# Patient Record
Sex: Female | Born: 1950 | Race: White | Hispanic: No | Marital: Single | Smoking: Never smoker
Health system: Southern US, Community
[De-identification: ages and names within clinical notes are randomized; demographics above are authoritative.]

## PROBLEM LIST (undated history)

## (undated) DIAGNOSIS — F419 Anxiety disorder, unspecified: Secondary | ICD-10-CM

## (undated) DIAGNOSIS — E119 Type 2 diabetes mellitus without complications: Secondary | ICD-10-CM

## (undated) DIAGNOSIS — C801 Malignant (primary) neoplasm, unspecified: Secondary | ICD-10-CM

## (undated) DIAGNOSIS — E785 Hyperlipidemia, unspecified: Secondary | ICD-10-CM

## (undated) DIAGNOSIS — I509 Heart failure, unspecified: Secondary | ICD-10-CM

## (undated) DIAGNOSIS — I10 Essential (primary) hypertension: Secondary | ICD-10-CM

## (undated) DIAGNOSIS — F329 Major depressive disorder, single episode, unspecified: Secondary | ICD-10-CM

## (undated) DIAGNOSIS — F32A Depression, unspecified: Secondary | ICD-10-CM

## (undated) HISTORY — PX: CHOLECYSTECTOMY: SHX55

## (undated) HISTORY — PX: ABDOMINAL HYSTERECTOMY: SHX81

---

## 2003-11-18 ENCOUNTER — Other Ambulatory Visit: Payer: Self-pay

## 2004-05-17 ENCOUNTER — Other Ambulatory Visit: Payer: Self-pay

## 2004-06-09 ENCOUNTER — Ambulatory Visit: Payer: Self-pay | Admitting: Gastroenterology

## 2005-06-22 ENCOUNTER — Emergency Department: Payer: Self-pay | Admitting: Emergency Medicine

## 2005-08-16 ENCOUNTER — Ambulatory Visit: Payer: Self-pay

## 2005-12-27 ENCOUNTER — Ambulatory Visit: Payer: Self-pay | Admitting: Otolaryngology

## 2006-01-26 ENCOUNTER — Ambulatory Visit: Payer: Self-pay | Admitting: Specialist

## 2006-03-17 ENCOUNTER — Emergency Department: Payer: Self-pay | Admitting: Emergency Medicine

## 2006-03-17 ENCOUNTER — Other Ambulatory Visit: Payer: Self-pay

## 2006-03-18 ENCOUNTER — Emergency Department: Payer: Self-pay | Admitting: Emergency Medicine

## 2006-03-19 ENCOUNTER — Emergency Department: Payer: Self-pay | Admitting: Emergency Medicine

## 2006-03-19 IMAGING — CR NECK SOFT TISSUES - 1+ VIEW
1 series · 3 of 3 positions shown · non-contrast
Comparison: none

REASON FOR EXAM: rm 16   Throat swelling
COMMENTS:

PROCEDURE:     DXR - DXR SOFT TISSUE NECK  - [DATE]  [DATE]
RESULT:     AP and lateral view reveals the epiglottis to be intact.  No
thickening of the epiglottis or aryepiglottic folds is noted.  No obvious
mass effect is seen on the tracheal air column.

[Series 1: view not recorded · 0.17mm/px · 3 of 3 slices shown]
[im 1/3]
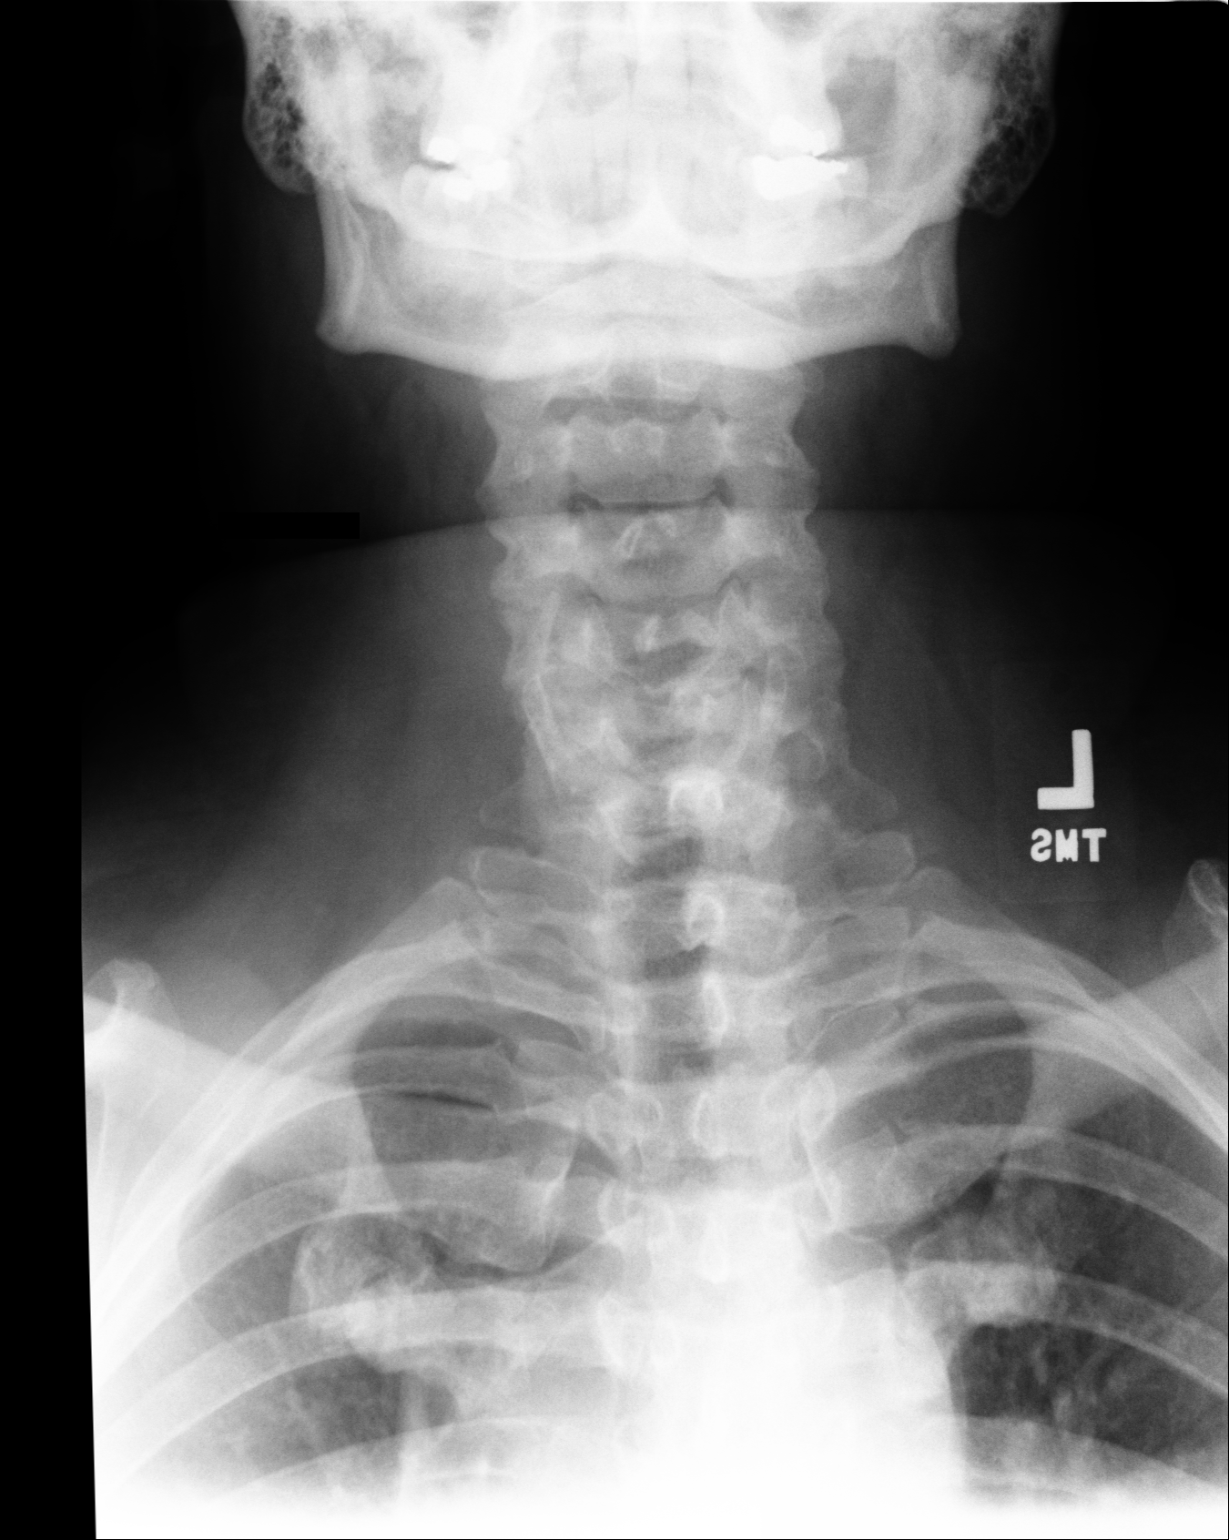
[im 2/3]
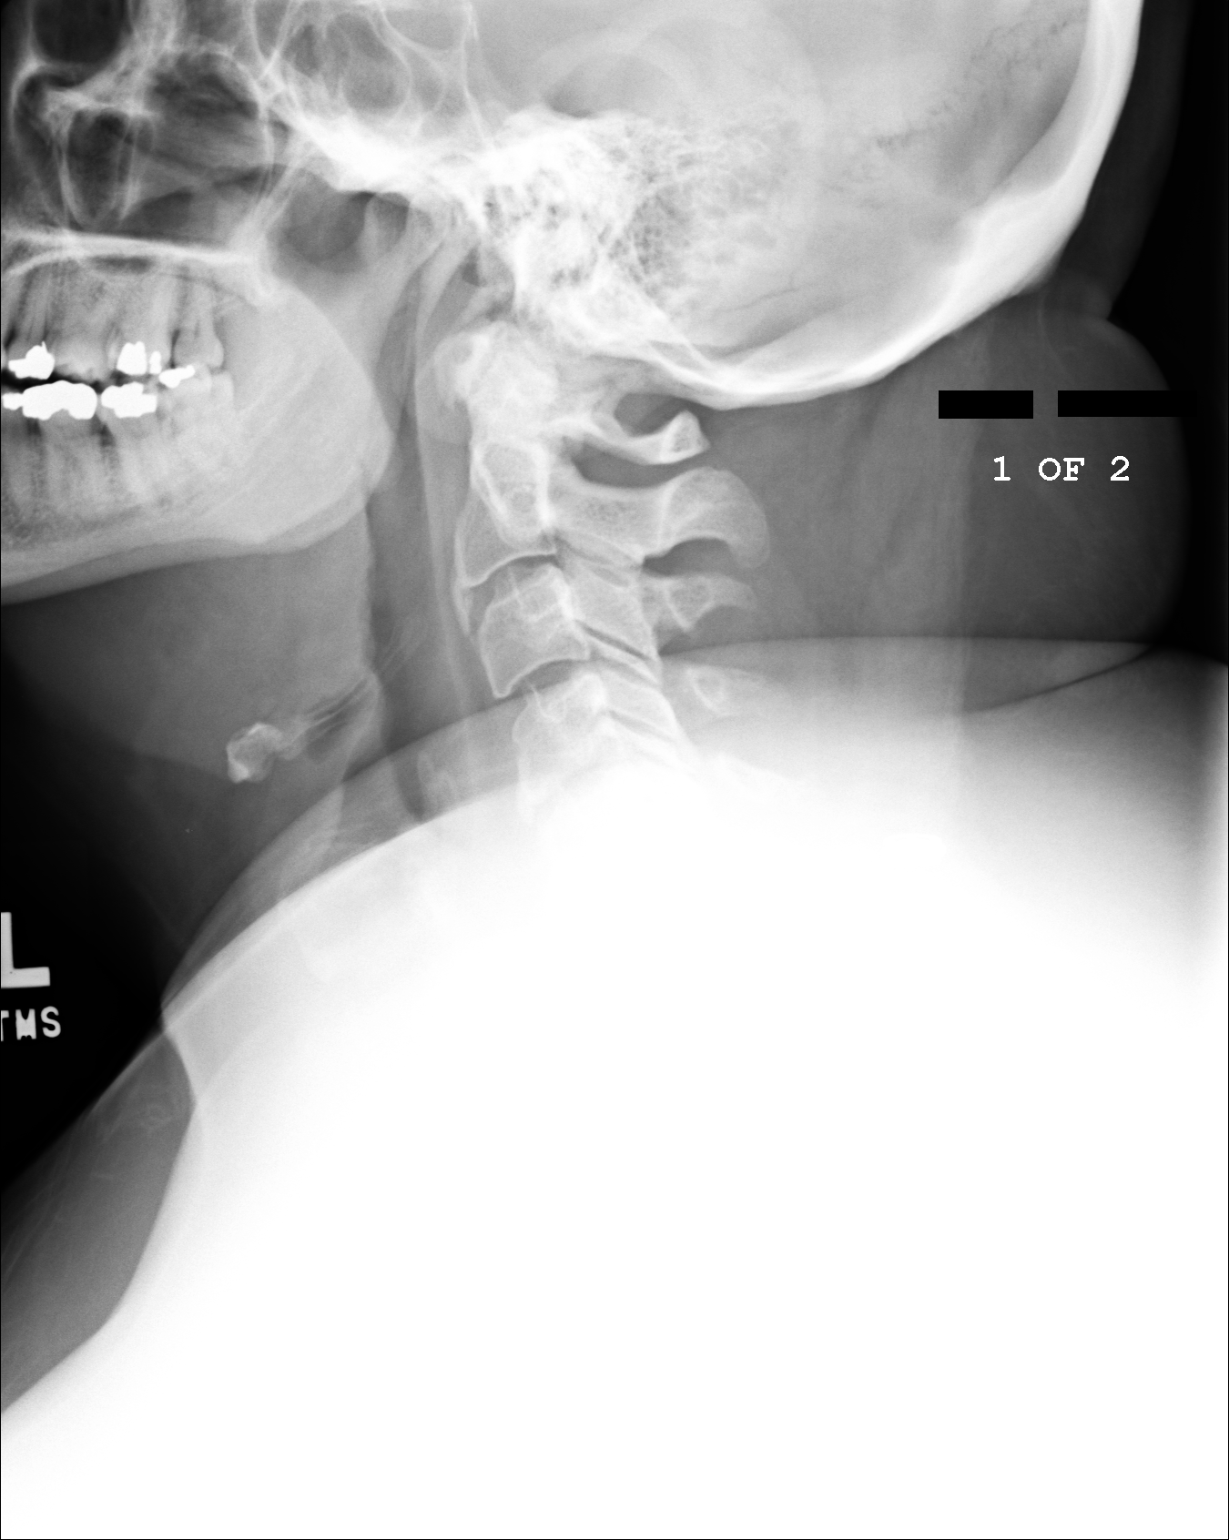
[im 3/3]
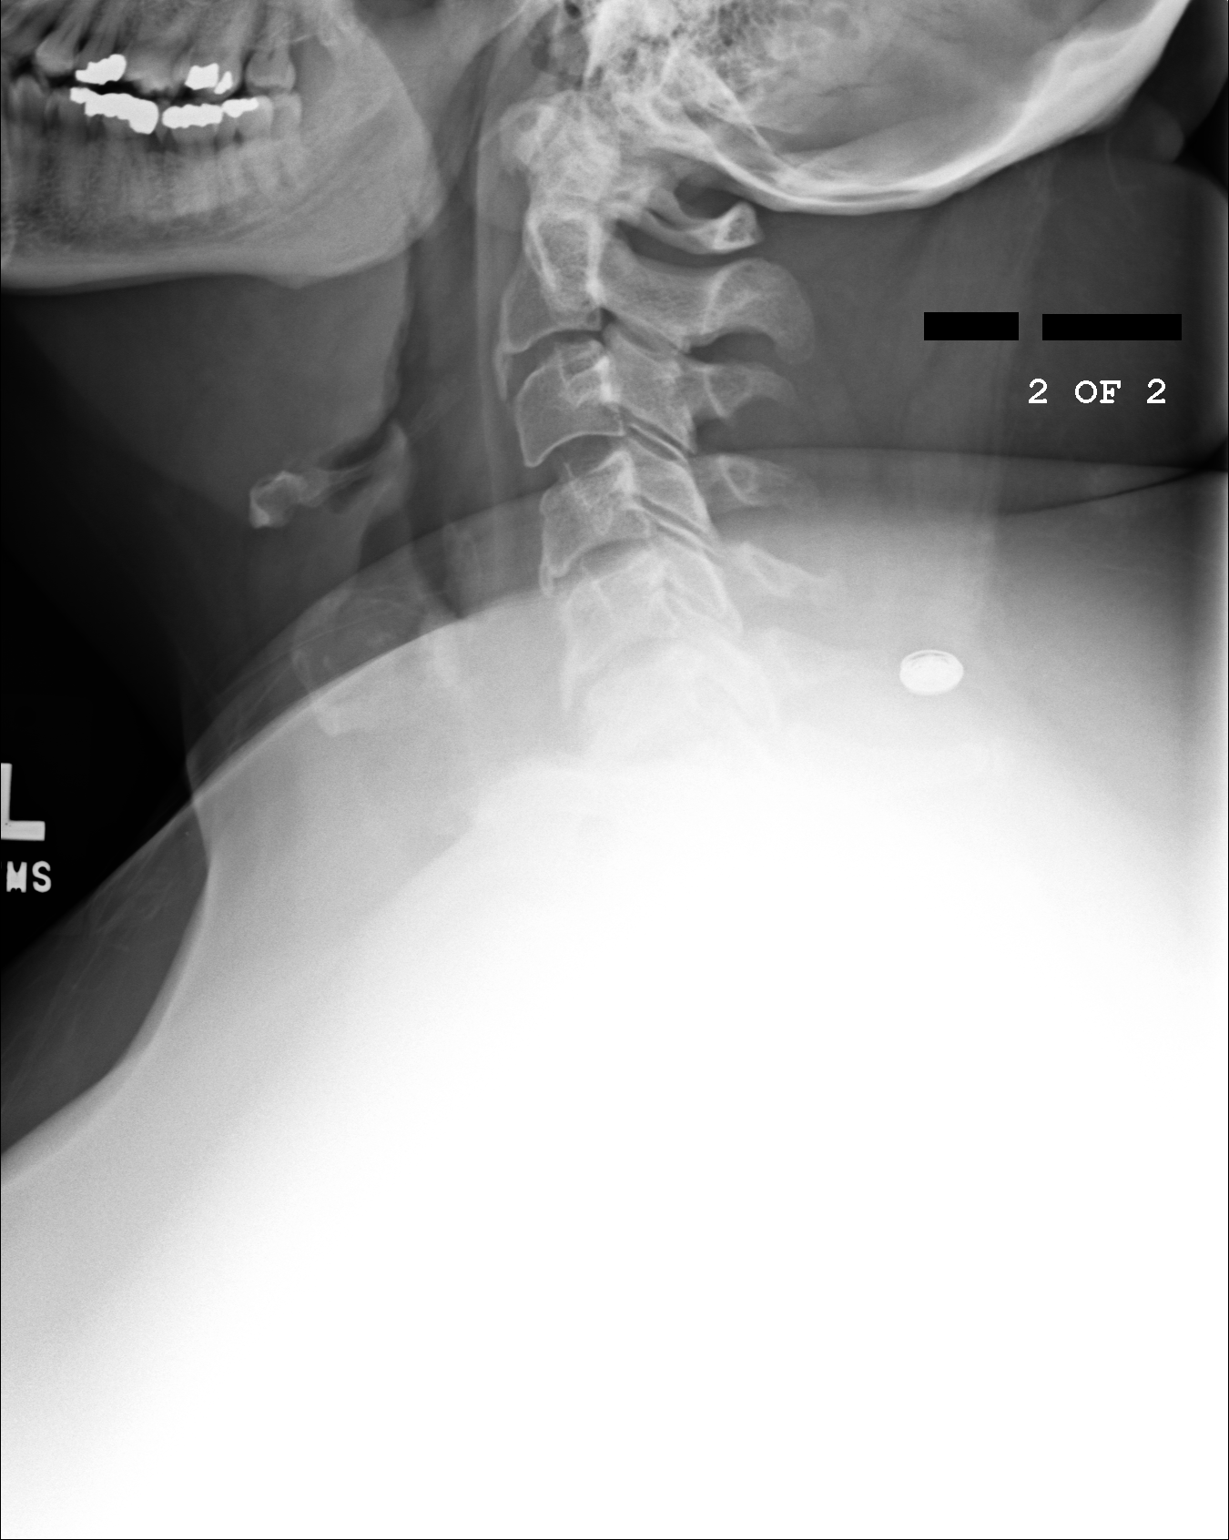

[3 of 3 positions shown; findings below may reference images not displayed]

IMPRESSION: No significant abnormalities noted of the soft tissues of the neck.

## 2006-07-27 ENCOUNTER — Ambulatory Visit: Payer: Self-pay | Admitting: Internal Medicine

## 2006-07-27 IMAGING — CT CT CHEST W/ CM
1 series · 15 of 33 positions shown, 19 images · non-contrast
Comparison: none

REASON FOR EXAM: SOB   CP    CALL report  [PHONE_NUMBER]
COMMENTS:

[Series 5: soft tissue · axial · 0.74mm/px · z∈[-288,-32]mm · 15 of 101 slices shown, 19 images]
[im 8/101  mediastinal]
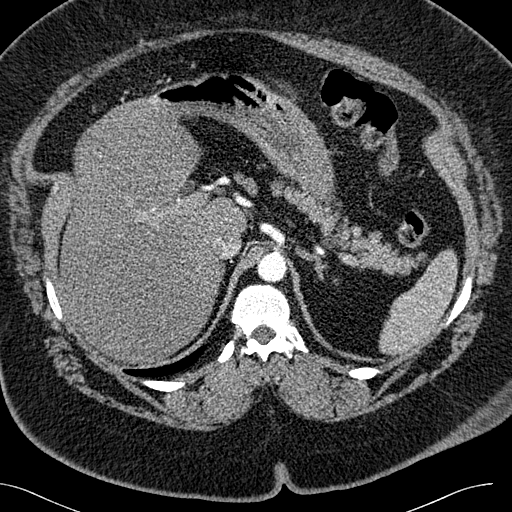
[im 8/101  lung]
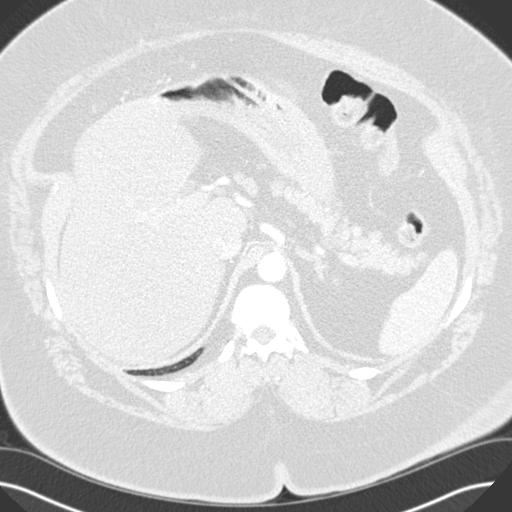
[im 15/101  lung]
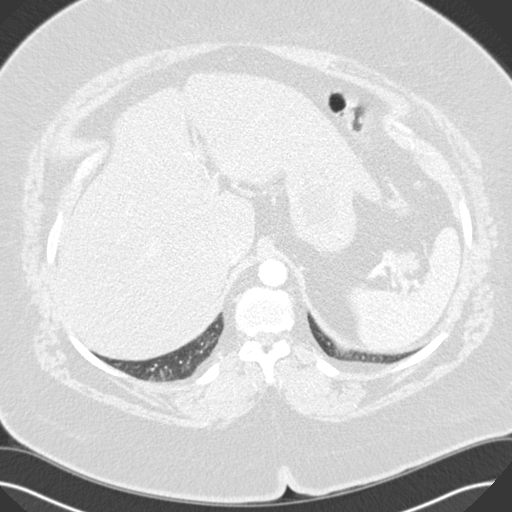
[im 21/101  lung]
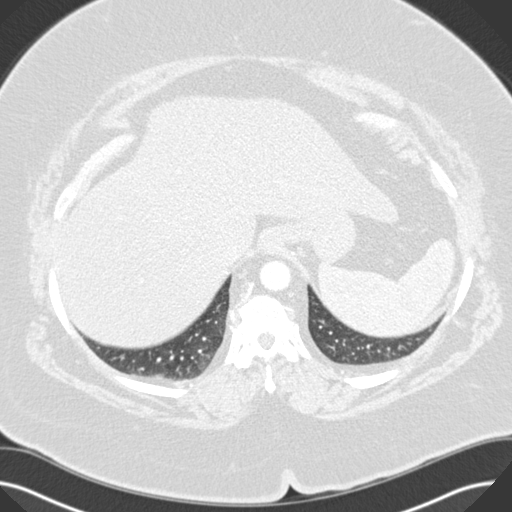
[im 26/101  lung]
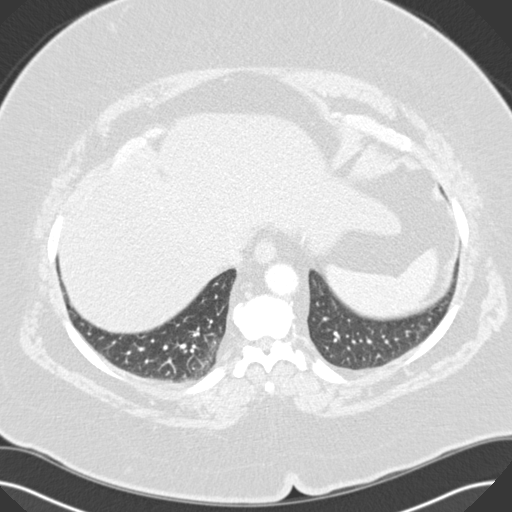
[im 34/101  mediastinal]
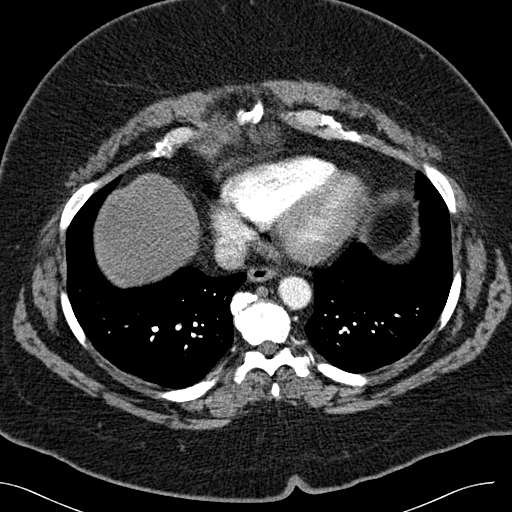
[im 34/101  lung]
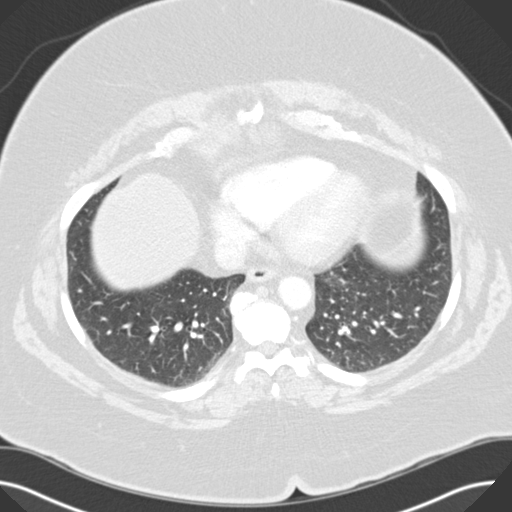
[im 41/101  lung]
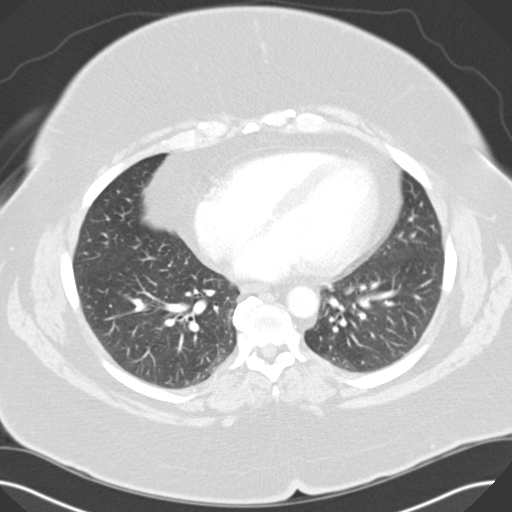
[im 48/101  lung]
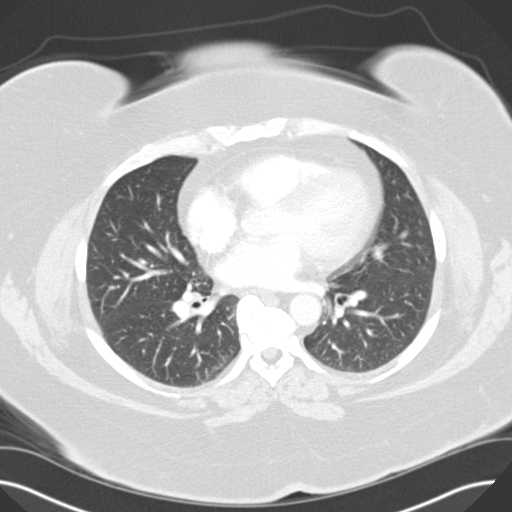
[im 52/101  lung]
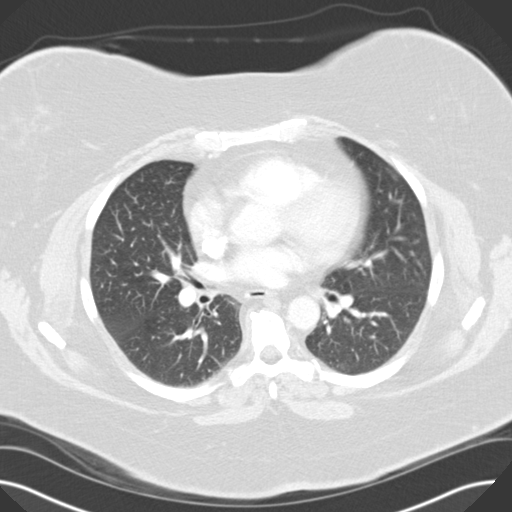
[im 56/101  mediastinal]
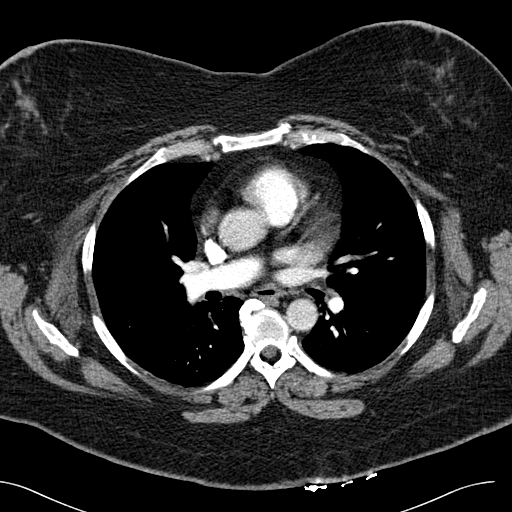
[im 56/101  lung]
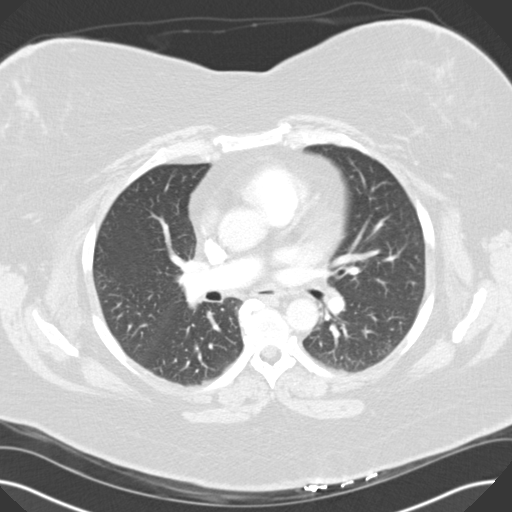
[im 61/101  lung]
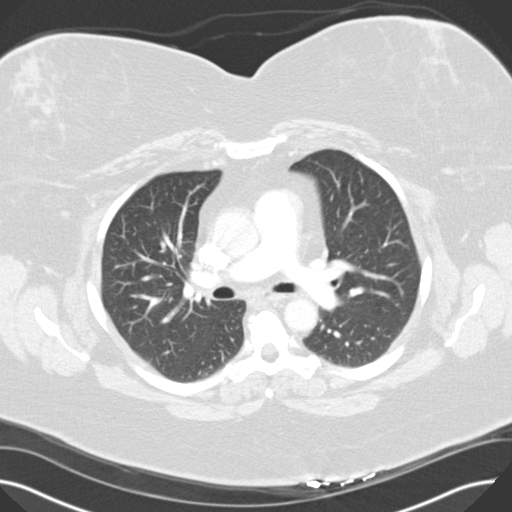
[im 67/101  lung]
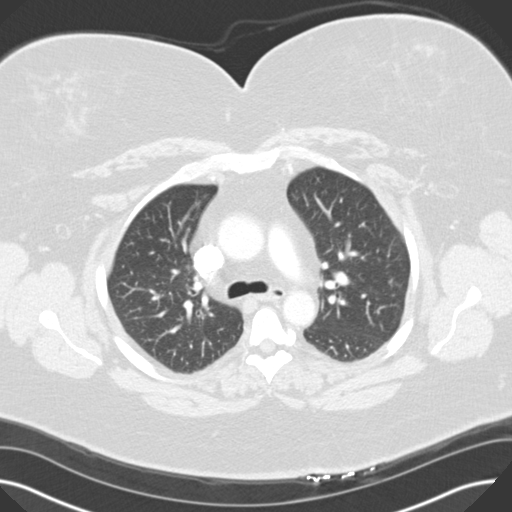
[im 75/101  lung]
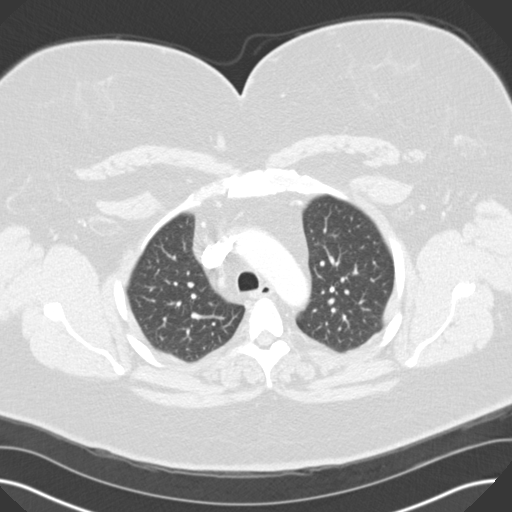
[im 81/101  mediastinal]
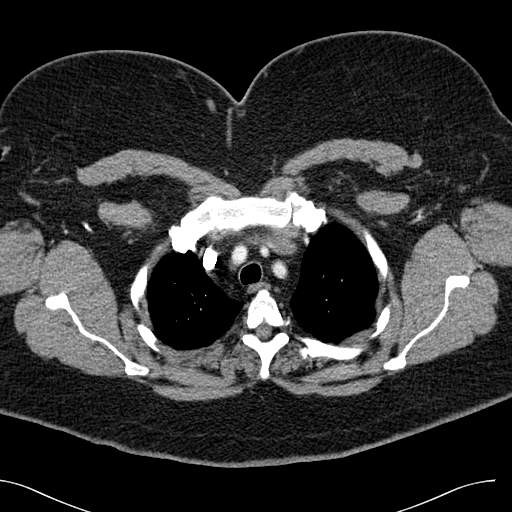
[im 81/101  lung]
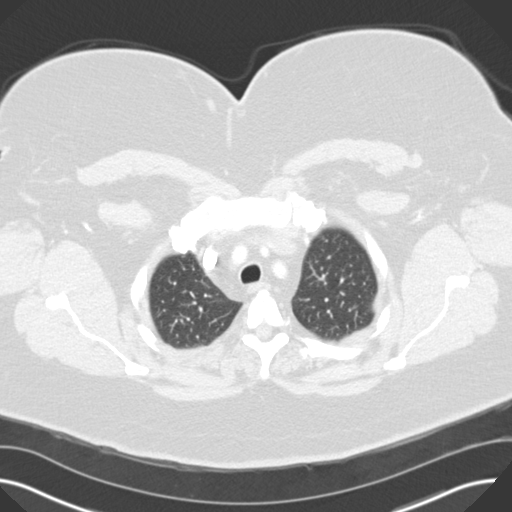
[im 86/101  lung]
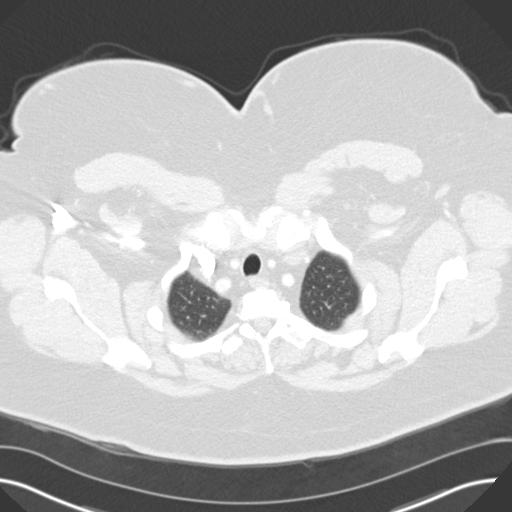
[im 93/101  lung]
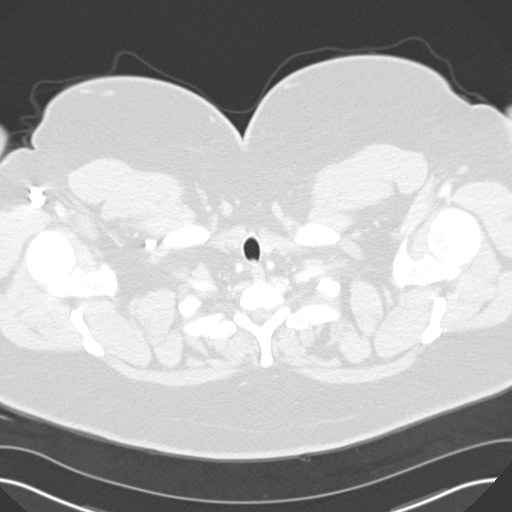

[15 of 33 positions shown; findings below may reference images not displayed]

PROCEDURE:     CT  - CT CHEST (FOR PE) W  - [DATE]  [DATE]

RESULT:     The patient is being evaluated for acute pulmonary embolism.
The patient has unexplained chest discomfort.  The patient received 100 mL
of [FE] for this study.

Contrast within the pulmonary arterial tree is normal in appearance.  I do
not see evidence of an acute pulmonary embolism.  The caliber of the
thoracic aorta is normal.  The cardiac chambers are not enlarged.  The
retrosternal soft tissues are normal in appearance and I see no hilar or
mediastinal lymphadenopathy.  There is no evidence of pneumothorax or
pneumomediastinum.  At lung window settings I see no abnormal nodules or
evidence of alveolar or interstitial infiltrates.  There are degenerative
changes of the thoracic spine at multiple levels with large anterior lateral
osteophytes demonstrated.  Within the upper abdomen the observed portions of
the liver are normal.  There are surgical clips in the gallbladder fossa.
IMPRESSION: I do not see evidence of an acute pulmonary embolism.

There is no evidence of congestive heart failure or of pneumonia.

I see no mediastinal lymphadenopathy and no other mediastinal abnormality.

The findings were called to Dr. SHARIFF at the conclusion of the study.

## 2006-10-17 ENCOUNTER — Ambulatory Visit: Payer: Self-pay

## 2006-10-27 ENCOUNTER — Ambulatory Visit: Payer: Self-pay

## 2007-01-16 ENCOUNTER — Ambulatory Visit: Payer: Self-pay | Admitting: Gastroenterology

## 2007-01-16 IMAGING — CT CT ABD, PELV EXAM
2 series · 14 of 32 positions shown, 19 images · IV contrast (APPLIED)
Comparison: none

REASON FOR EXAM: Abdominal Pain Generalized
COMMENTS:

PROCEDURE:     POLLOCK CRUZ - POLLOCK CRUZ CT ABDOMEN/PELVIS W  - [DATE]  [DATE]
RESULT:
TECHNIQUE: Axial images were obtained from the hemidiaphragms to the pubic
symphysis post intravenous and oral administration of contrast material.

[Series 2: a/p with 8.0 b40s · axial · 0.98mm/px · z∈[-540,-164]mm · 8 of 59 slices shown]
[im 6/59  soft-tissue]
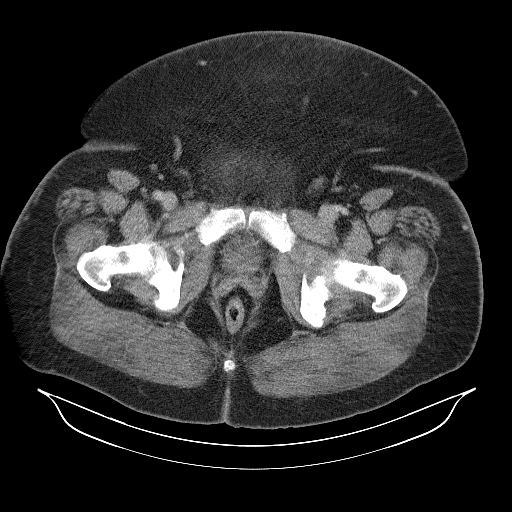
[im 13/59  soft-tissue]
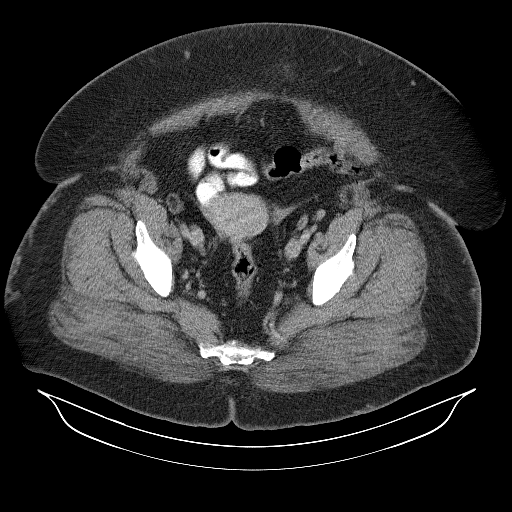
[im 18/59  soft-tissue]
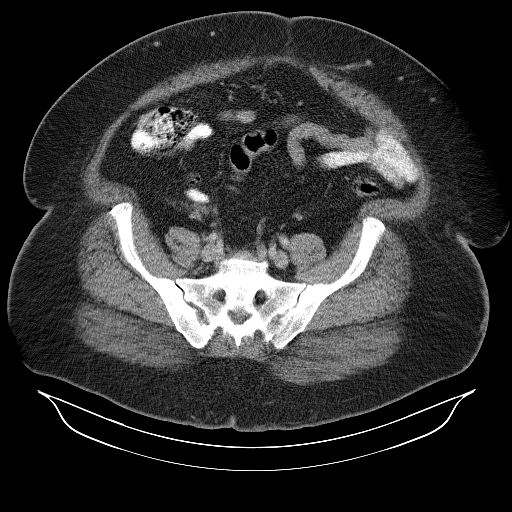
[im 26/59  soft-tissue]
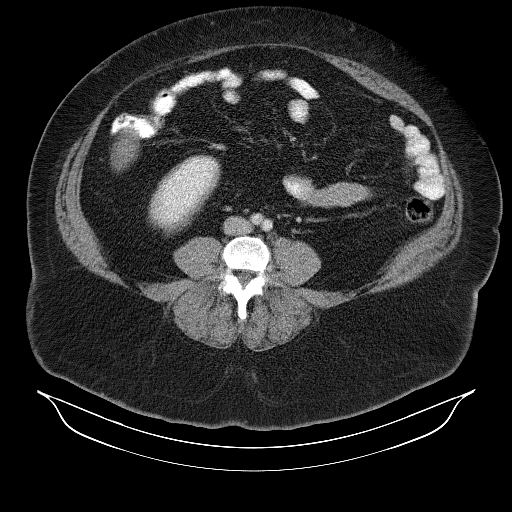
[im 33/59  soft-tissue]
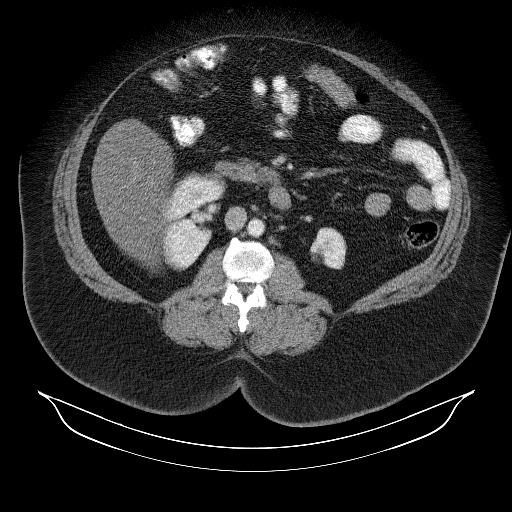
[im 41/59  soft-tissue]
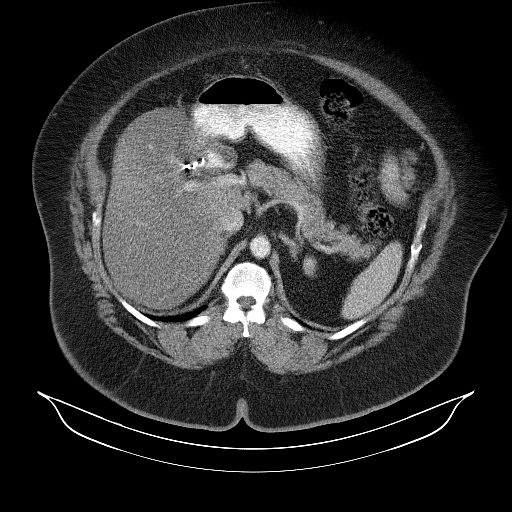
[im 46/59  soft-tissue]
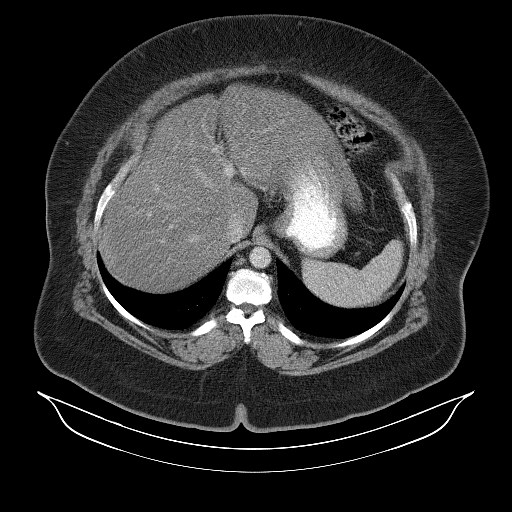
[im 53/59  soft-tissue]
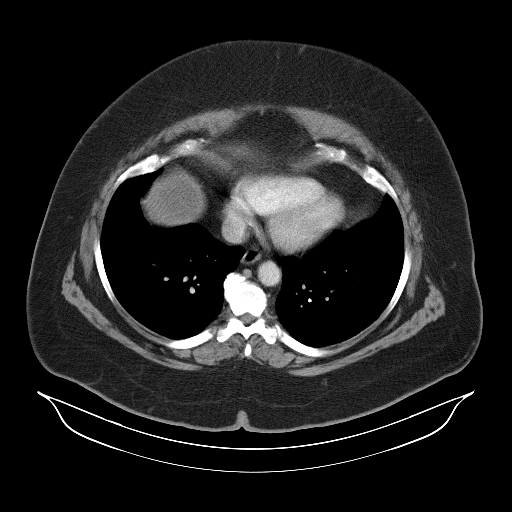

[Series 3: a/p with 8.0 b70s · axial · 0.98mm/px · z∈[-260,-140]mm · 6 of 21 slices shown, 11 images]
[im 3/21  soft-tissue]
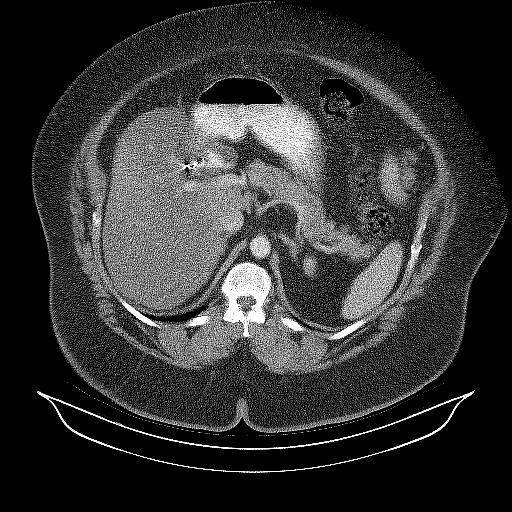
[im 3/21  bone]
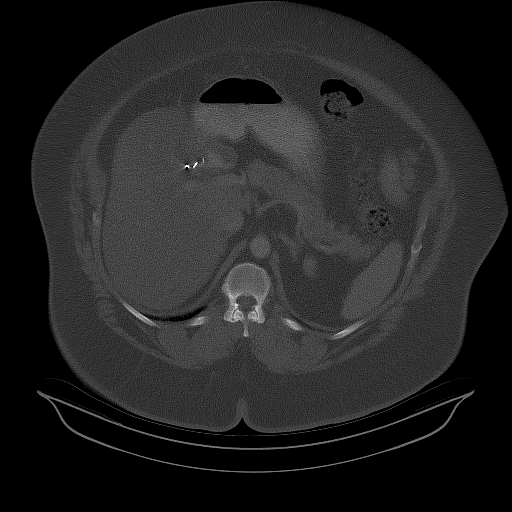
[im 6/21  soft-tissue]
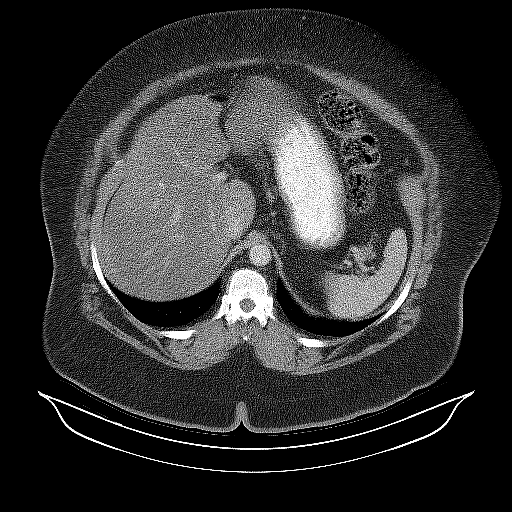
[im 9/21  soft-tissue]
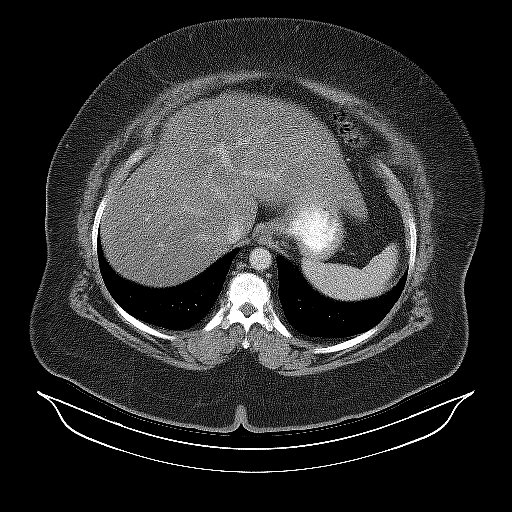
[im 9/21  lung]
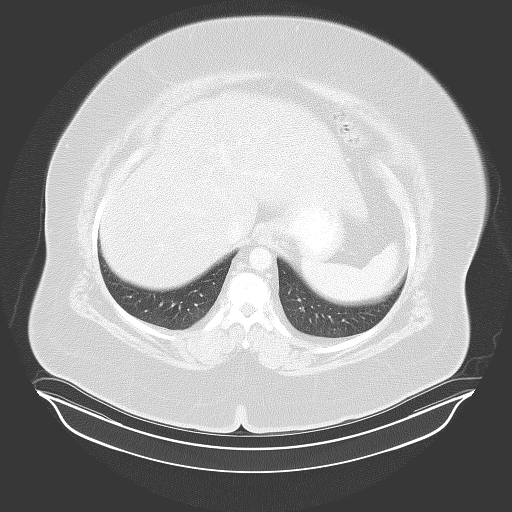
[im 12/21  soft-tissue]
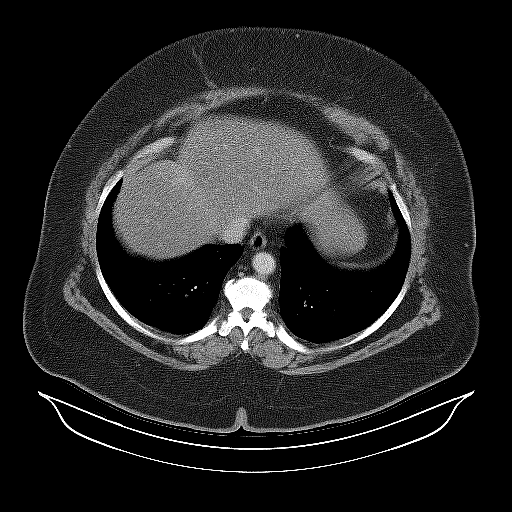
[im 12/21  lung]
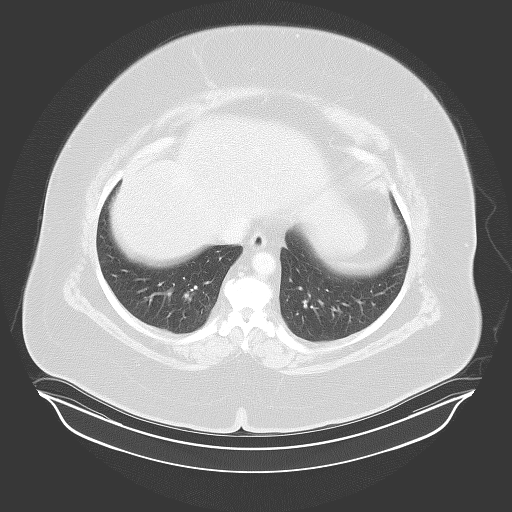
[im 15/21  soft-tissue]
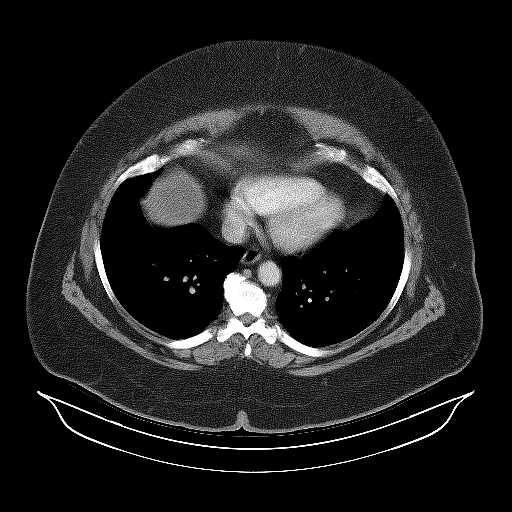
[im 15/21  lung]
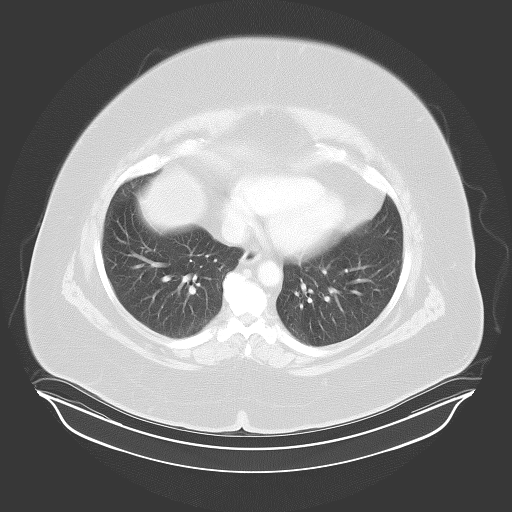
[im 18/21  soft-tissue]
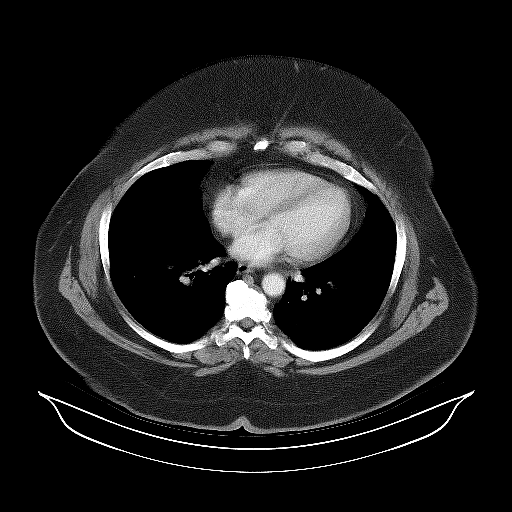
[im 18/21  lung]
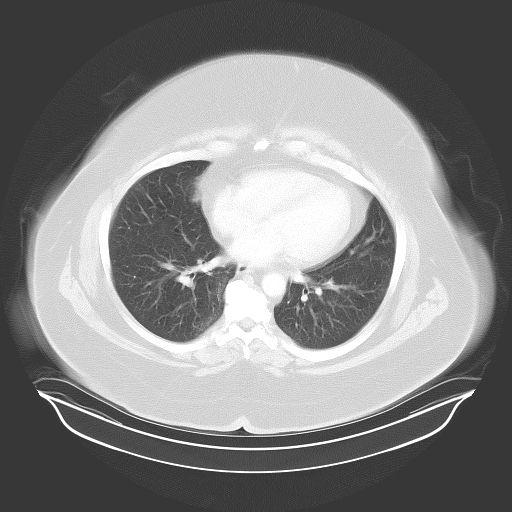

[14 of 32 positions shown; findings below may reference images not displayed]

FINDINGS: The liver and spleen appear intact. No focal lesions are
identified. The liver demonstrates lower attenuation which may represent
some infiltration. The patient is status post cholecystectomy. No pancreatic
masses are seen. The adrenal glands appear intact.  The LEFT kidney is
smaller and lobulated and may be secondary to pyelonephritis. No ascites is
noted. No retroperitoneal or pelvic lymphadenopathy is noted. No anterior
abdominal wall hernias are noted. On the lung window settings the lung bases
appear clear.
IMPRESSION: 1)Fatty infiltration of the liver.

2)Smaller lobulated LEFT kidney which may secondary to pyelonephritis.

## 2007-02-12 ENCOUNTER — Ambulatory Visit: Payer: Self-pay | Admitting: Gastroenterology

## 2007-11-19 ENCOUNTER — Ambulatory Visit: Payer: Self-pay | Admitting: Internal Medicine

## 2007-11-19 IMAGING — CT CT ABD-PELV W/ CM
1 of 2 series · 15 of 32 positions shown, 19 images · non-contrast
Comparison: none

REASON FOR EXAM: RLQ Pain Abdominal Pain
COMMENTS:

[Series 2: appendicitis · axial · 0.85mm/px · z∈[-862,-430]mm · 15 of 157 slices shown, 19 images]
[im 7/157  soft-tissue]
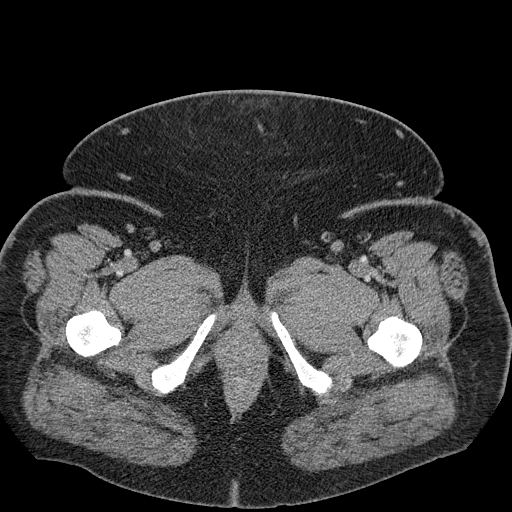
[im 7/157  bone]
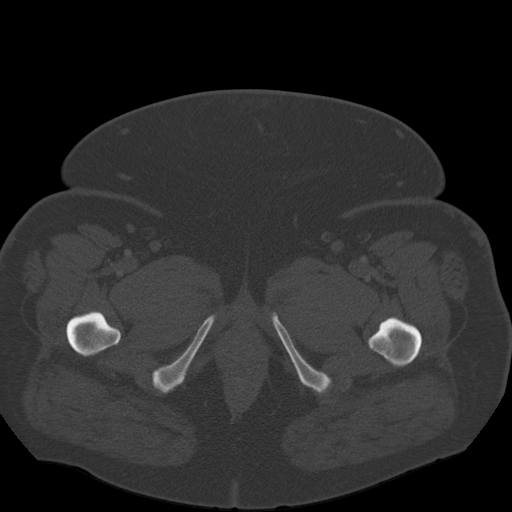
[im 19/157  soft-tissue]
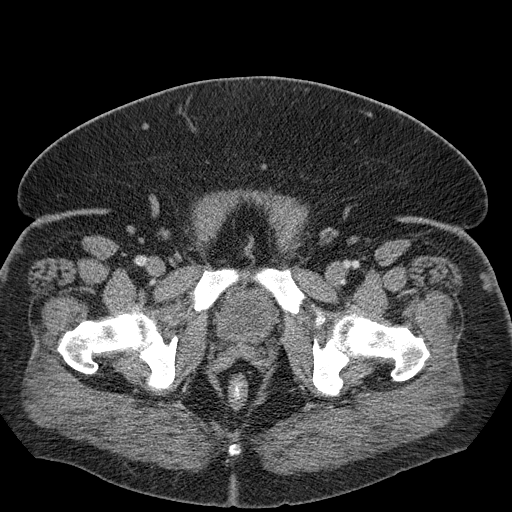
[im 31/157  soft-tissue]
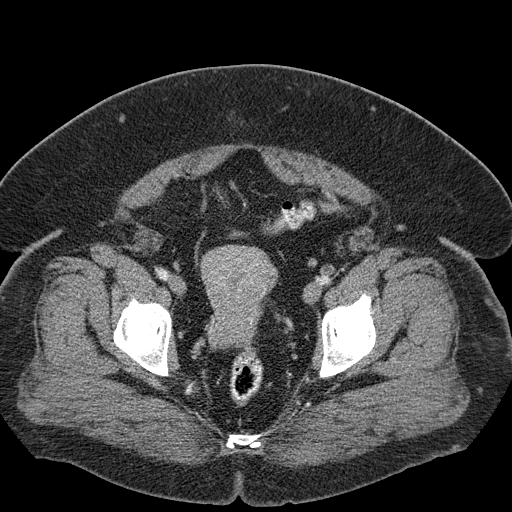
[im 43/157  soft-tissue]
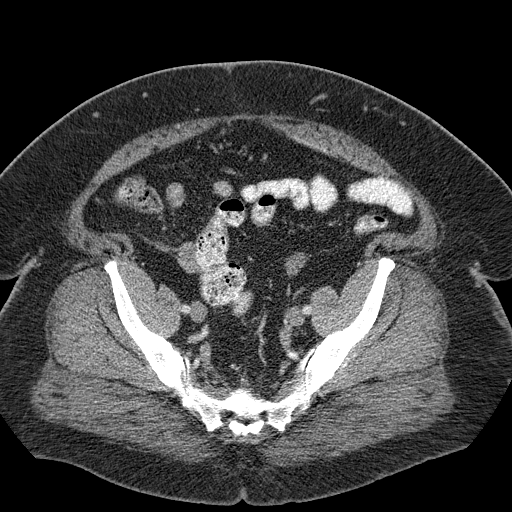
[im 55/157  soft-tissue]
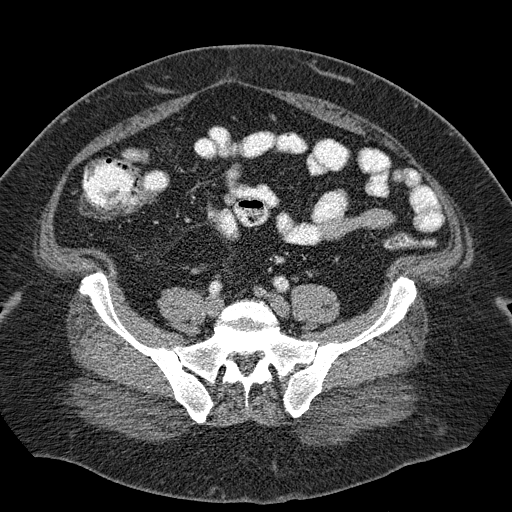
[im 67/157  soft-tissue]
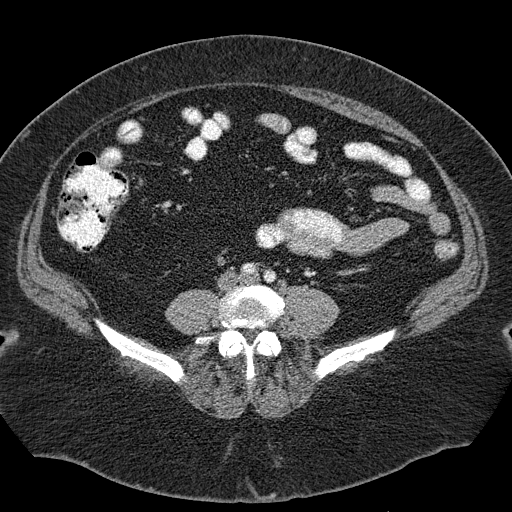
[im 79/157  soft-tissue]
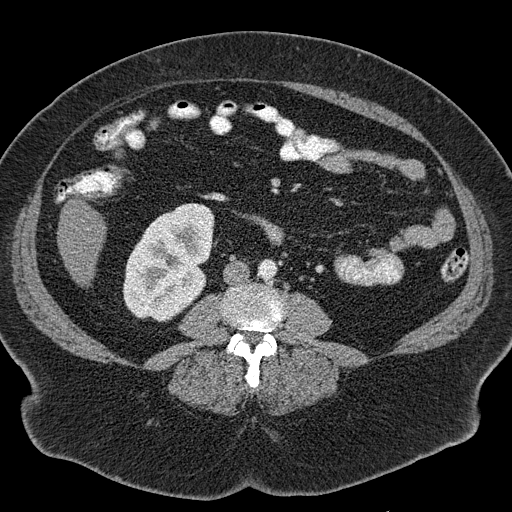
[im 91/157  soft-tissue]
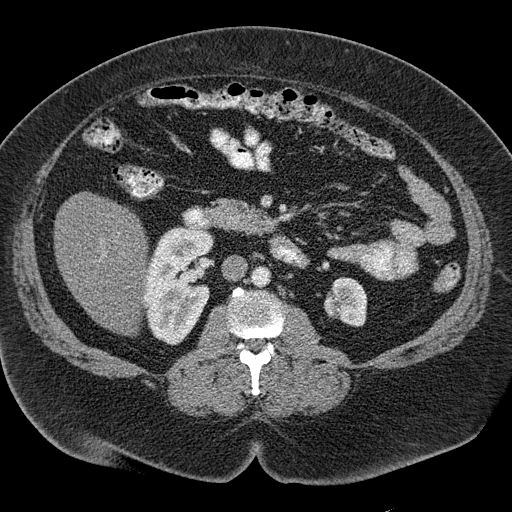
[im 103/157  soft-tissue]
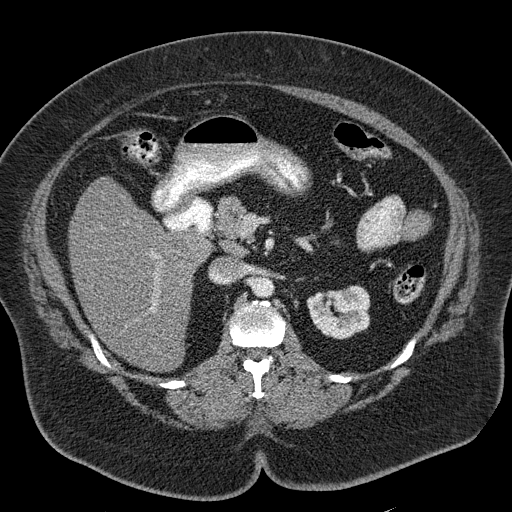
[im 103/157  bone]
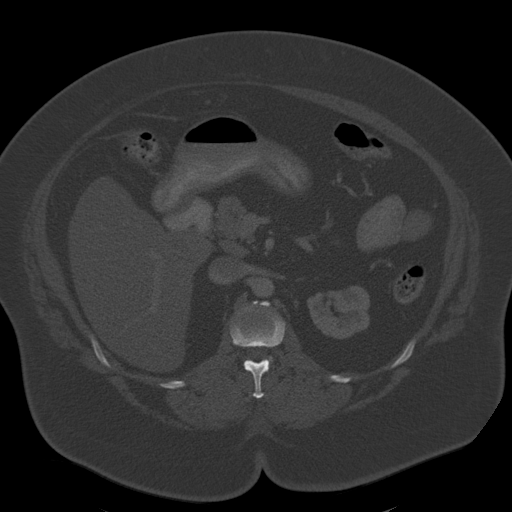
[im 115/157  soft-tissue]
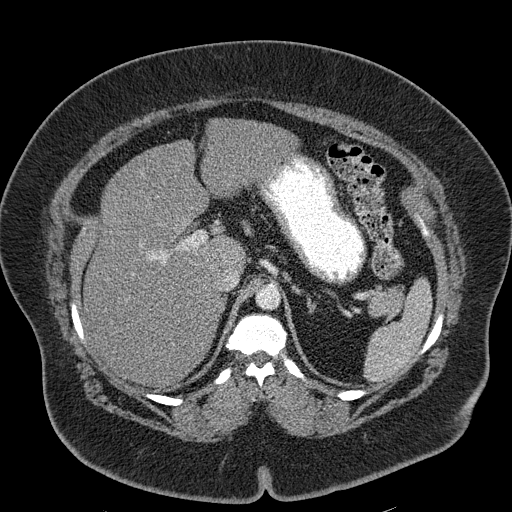
[im 127/157  soft-tissue]
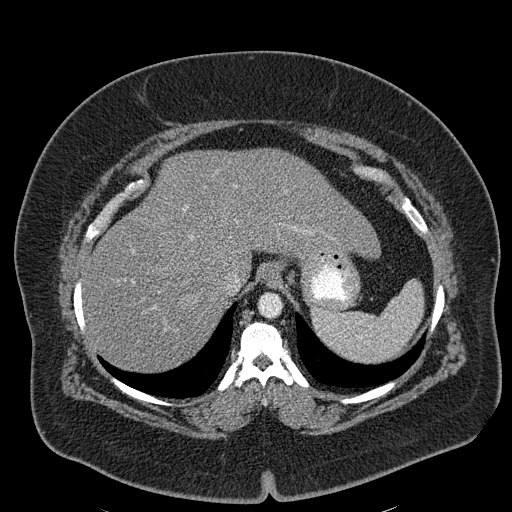
[im 133/157  lung]
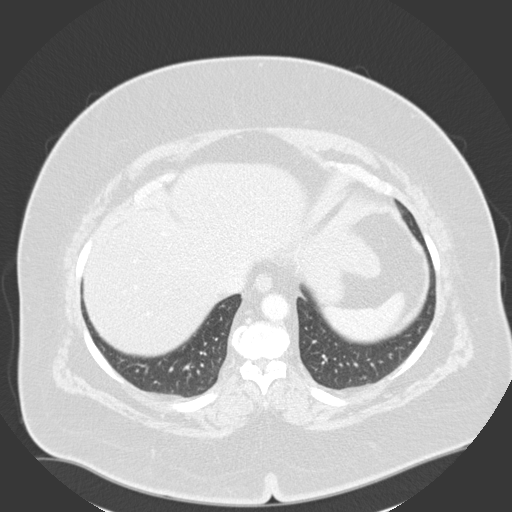
[im 139/157  soft-tissue]
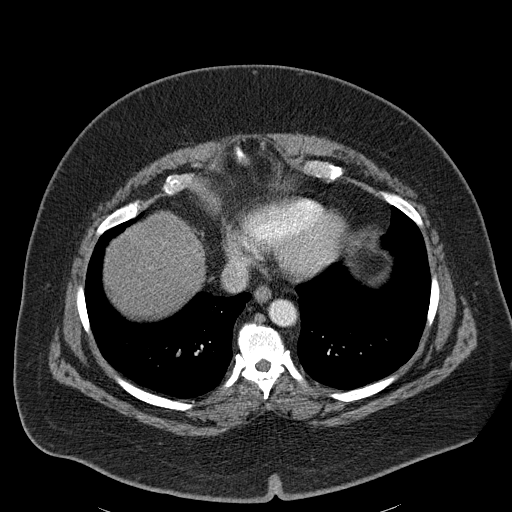
[im 139/157  lung]
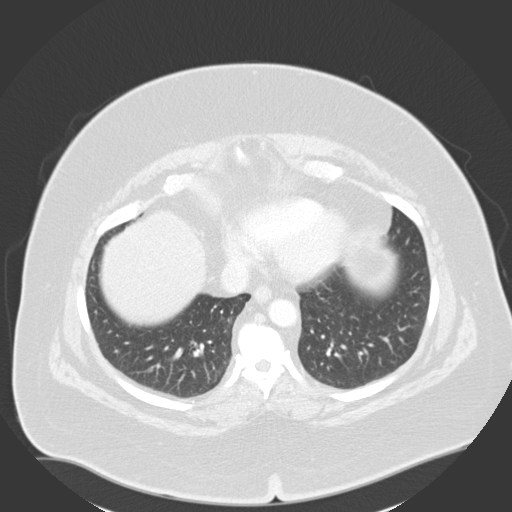
[im 145/157  lung]
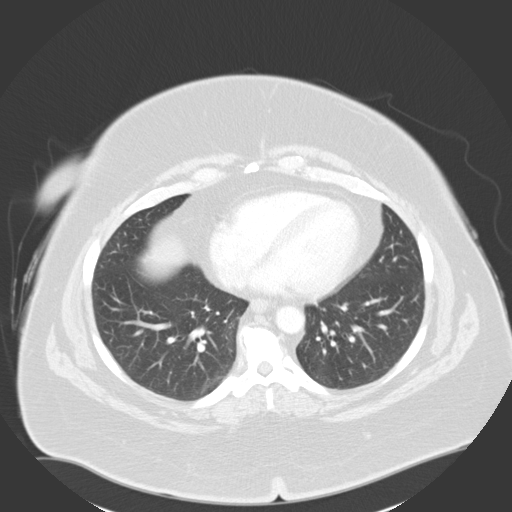
[im 151/157  soft-tissue]
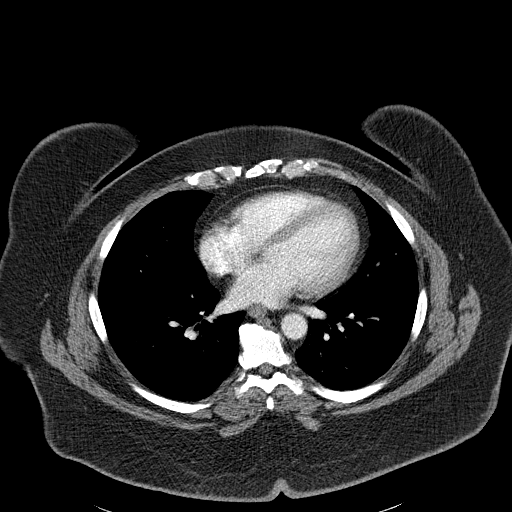
[im 151/157  lung]
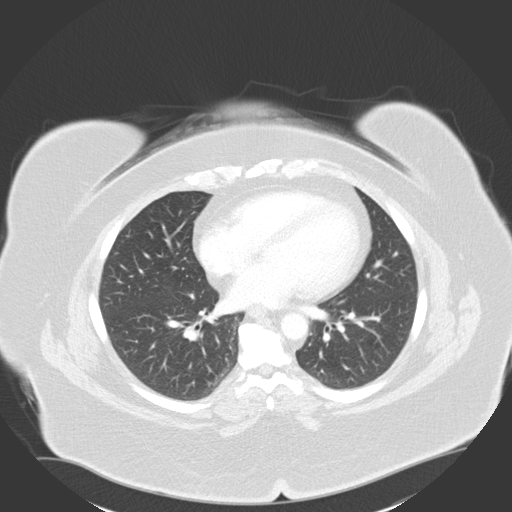

[15 of 32 positions shown; findings below may reference images not displayed]

PROCEDURE:     CT  - CT ABDOMEN / PELVIS  W  - [DATE] [DATE]

RESULT:     CT of the abdomen and pelvis is performed utilizing oral
contrast and 85 ml of [UX] iodinated intravenous contrast. Comparison
is made to the previous exam dated [DATE]. The uterus and ovaries appear
to be unremarkable. There is no pelvic inflammation evident. There is no
abnormal bowel distention. There is no ascites. The appendix is not
definitely identified. There is some prominence in the RIGHT adnexal region.
This is in the region of the appendix as seen previously. No abscess or free
air is evident. Pelvic ultrasound correlation may be beneficial. There does
appear to be a RIGHT iliac lymph node measuring 1.7 cm in length on image
#121. This has a fatty central appearance. Some smaller LEFT pelvic lymph
nodes are present in a similar location.

The kidneys enhance normally and show no obstruction. The LEFT kidney
demonstrates some evidence of atrophy and cortical irregularity. Is there a
history of urinary tract infection? The appearance of the LEFT kidney is
unchanged. Cholecystectomy clips are present. There is fatty infiltration of
the liver without a focal mass being demonstrated. The spleen appears
unremarkable. The pancreas is within normal limits. The adrenal glands are
normal.

There is no abnormal bowel distention or evidence of bowel perforation.
IMPRESSION: Mild fullness in the RIGHT adnexal region. There are some
borderline to mildly enlarged iliac lymph nodes bilaterally. No definite
acute inflammatory process is evident. The appendix is not well seen. The
fullness could be secondary to mild appendicitis. Clinical and laboratory
correlation is recommended. Consideration for pelvic ultrasound could be
given for further investigation of the RIGHT adnexa. The lung bases appear
clear.

## 2007-11-21 ENCOUNTER — Ambulatory Visit: Payer: Self-pay | Admitting: Internal Medicine

## 2007-11-21 IMAGING — US US PELV - US TRANSVAGINAL
1 series · 17 of 25 positions shown · non-contrast
Comparison: none

REASON FOR EXAM: adnexal fullness found on previous CT
COMMENTS:

[Series 1: us pelv - us transvaginal · 17 of 31 slices shown]
[im 1/31]
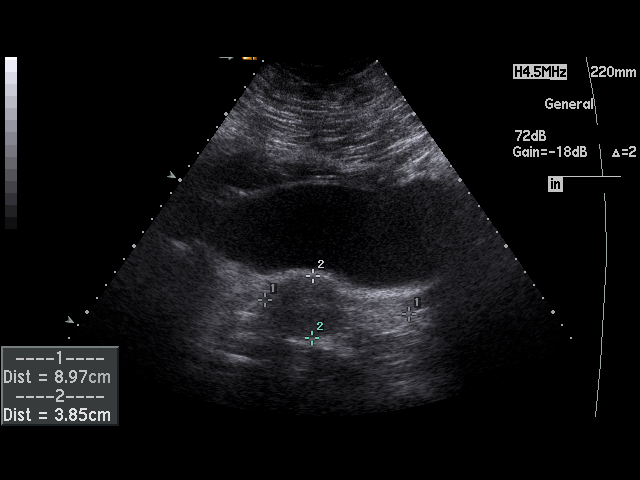
[im 3/31]
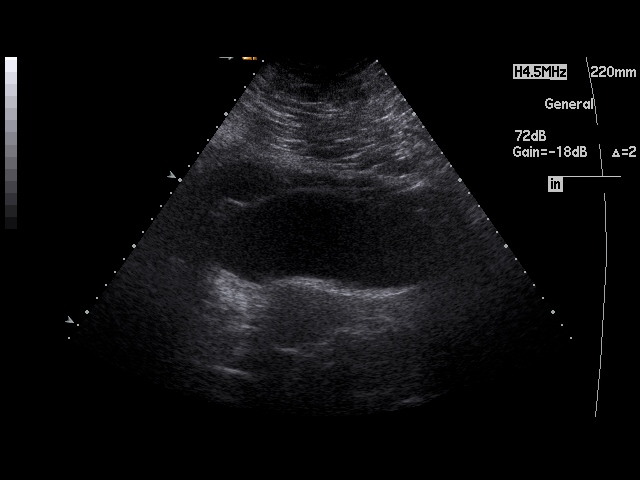
[im 4/31]
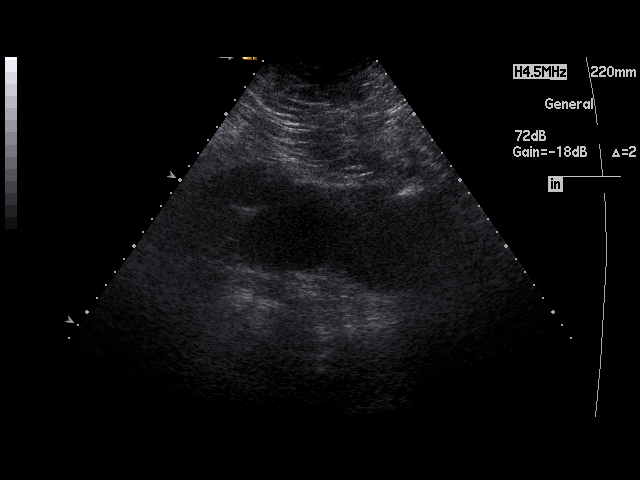
[im 7/31]
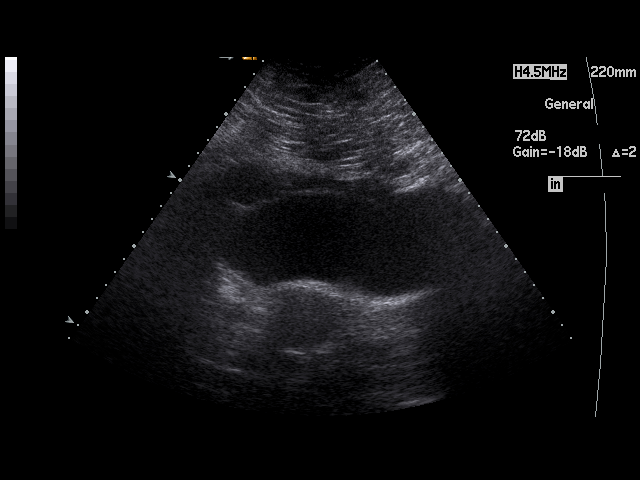
[im 8/31]
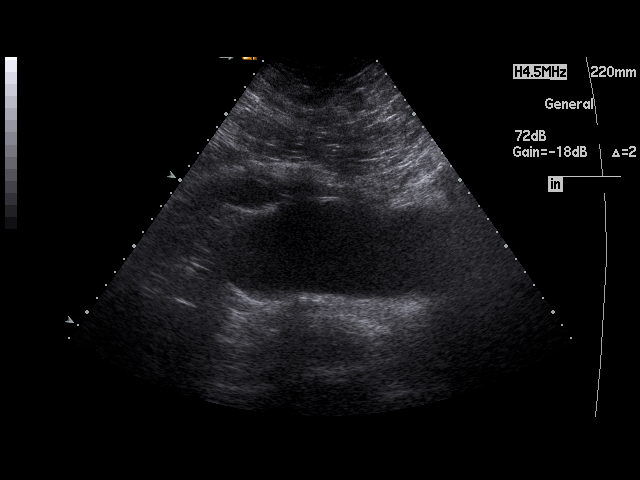
[im 11/31]
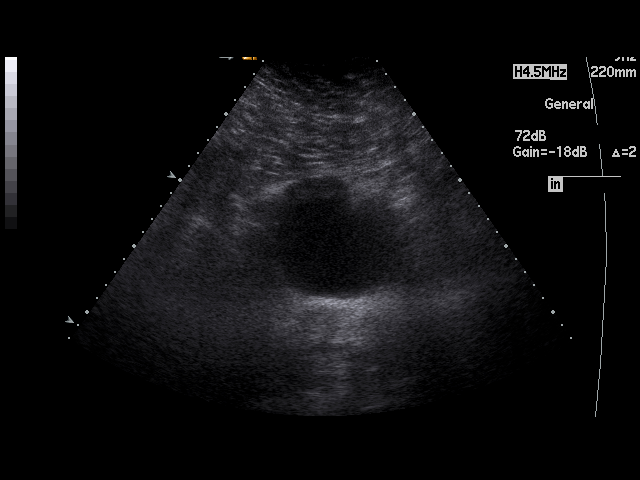
[im 12/31]
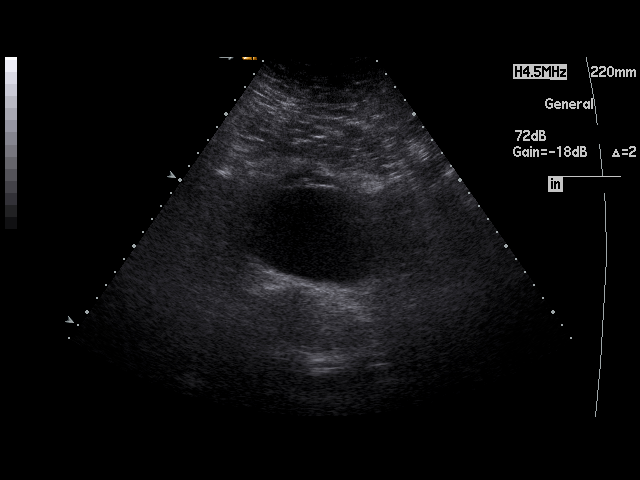
[im 14/31]
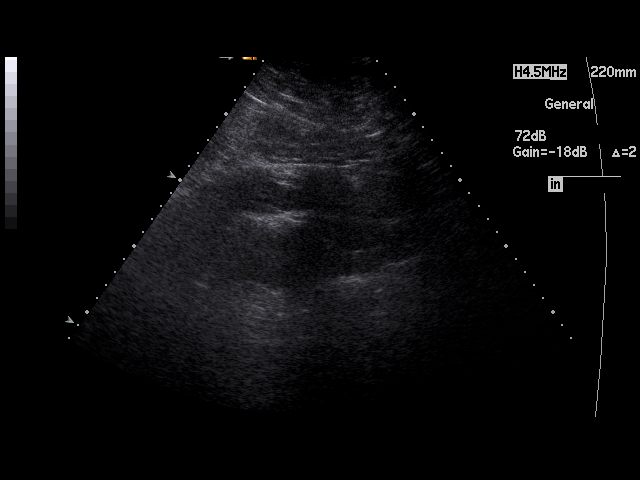
[im 16/31]
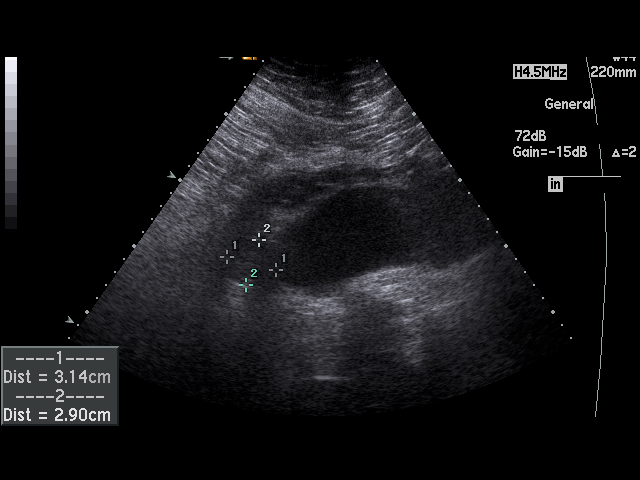
[im 17/31]
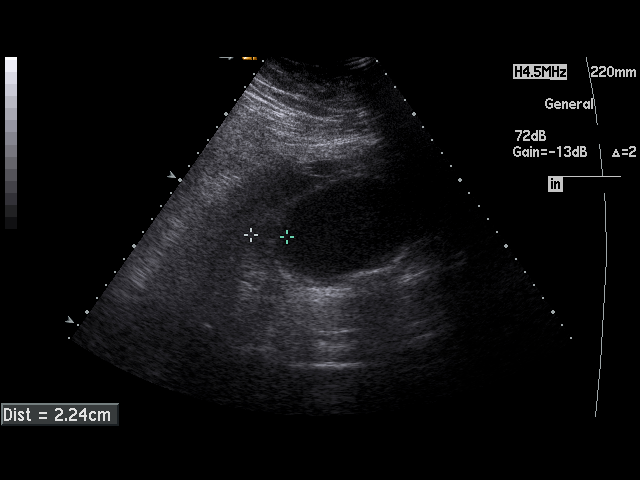
[im 19/31]
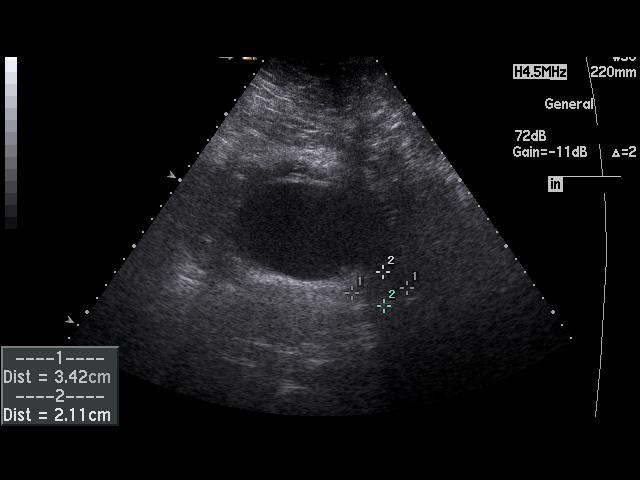
[im 21/31]
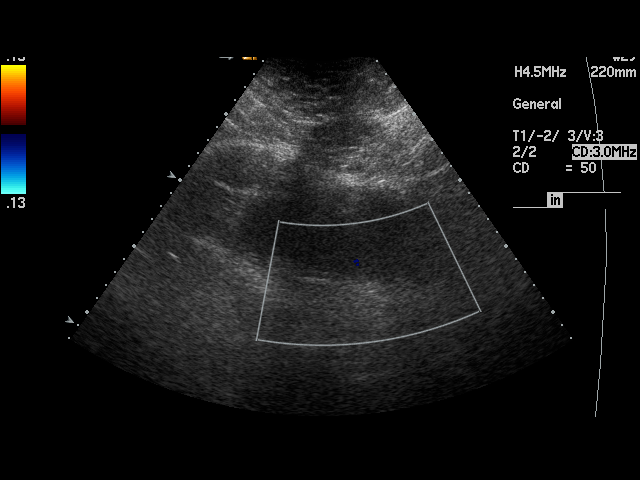
[im 23/31]
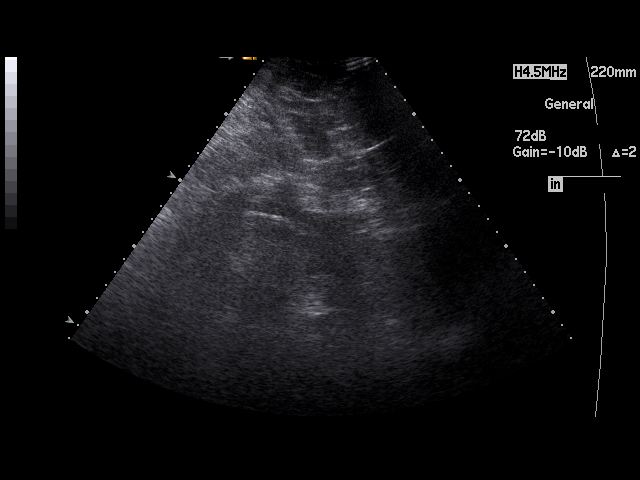
[im 24/31]
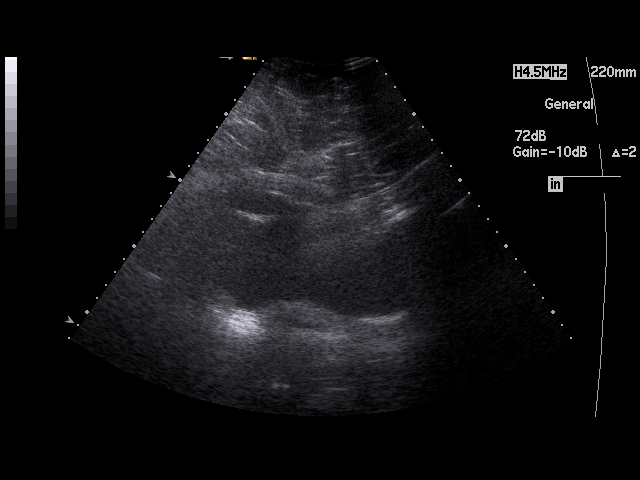
[im 27/31]
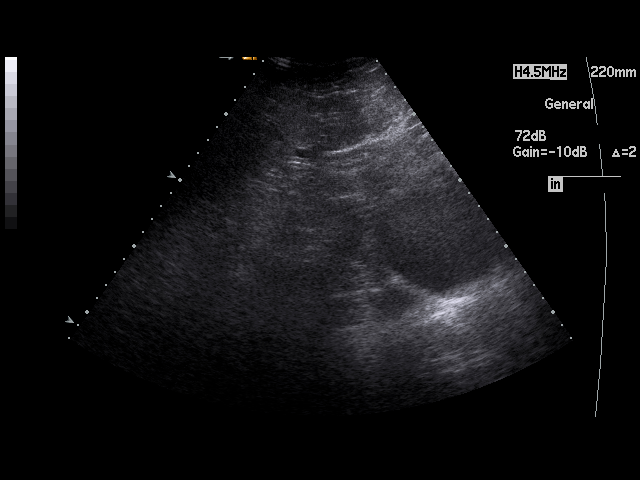
[im 28/31]
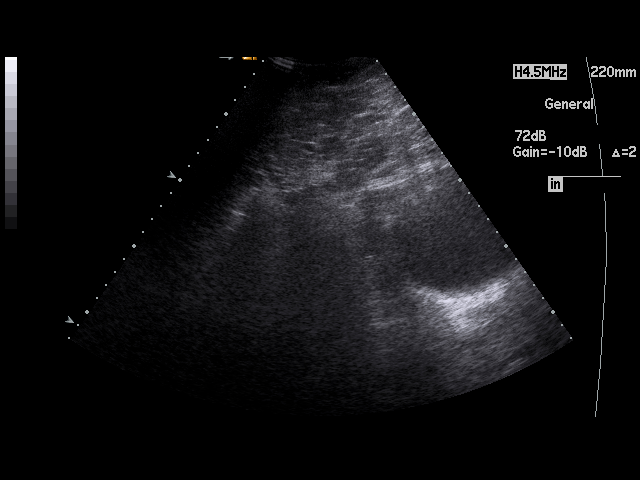
[im 31/31]
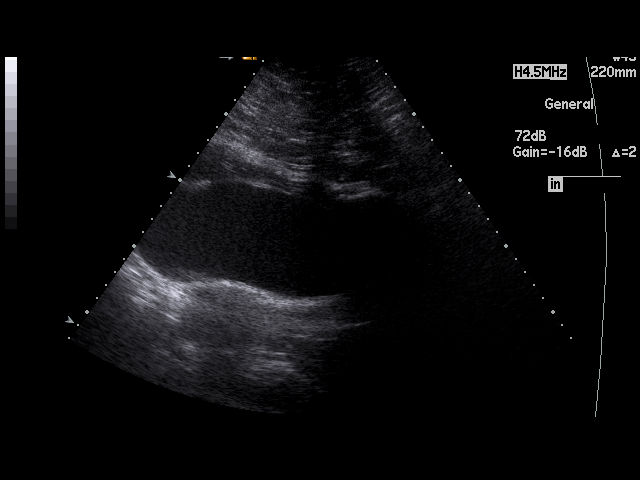

[17 of 25 positions shown; findings below may reference images not displayed]

PROCEDURE:     US  - US PELVIS MASS EXAM  - [DATE] [DATE] [DATE]  [DATE]

RESULT:     The uterus measures 8.97 cm x 3.85 cm x 6.28 cm.  The
endometrium measures 6.8 mm in thickness. No uterine mass lesions are seen.
The RIGHT and LEFT ovaries are visualized. The RIGHT ovary measures 3.14 cm
at maximum diameter and the LEFT ovary measures 3.42 cm at maximum diameter.
No abnormal adnexal masses are seen. There is no free fluid in the pelvis.
The visualized portion of the urinary bladder is normal in appearance.
IMPRESSION: 1.No significant abnormalities are identified.  The etiology for the RIGHT
adnexal fullness noted at CT is not identified on this exam.

## 2008-01-02 ENCOUNTER — Ambulatory Visit: Payer: Self-pay | Admitting: Internal Medicine

## 2009-05-07 ENCOUNTER — Ambulatory Visit: Payer: Self-pay | Admitting: Internal Medicine

## 2009-05-07 IMAGING — US US PELV - US TRANSVAGINAL
1 series · 17 of 25 positions shown · non-contrast
Comparison: none

REASON FOR EXAM: LLQ pain
COMMENTS:

[Series 1: us pelv - us transvaginal · 17 of 27 slices shown]
[im 1/27]
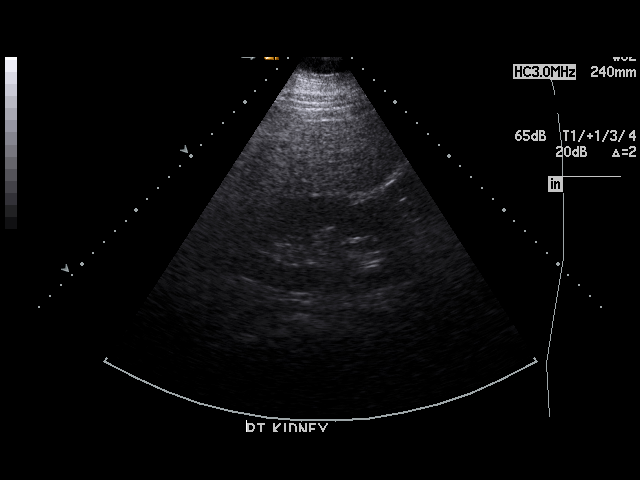
[im 3/27]
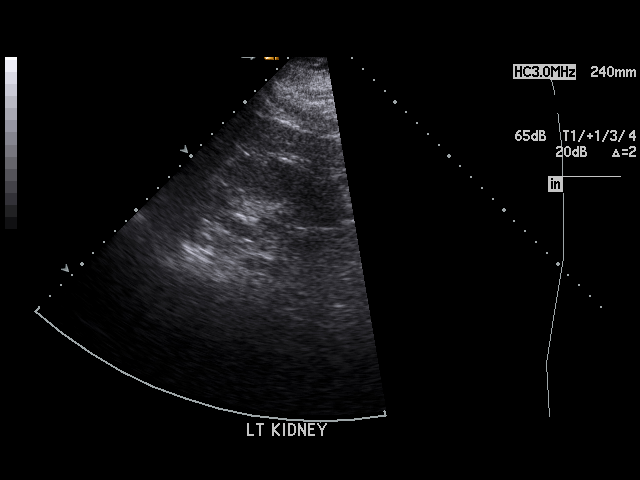
[im 4/27]
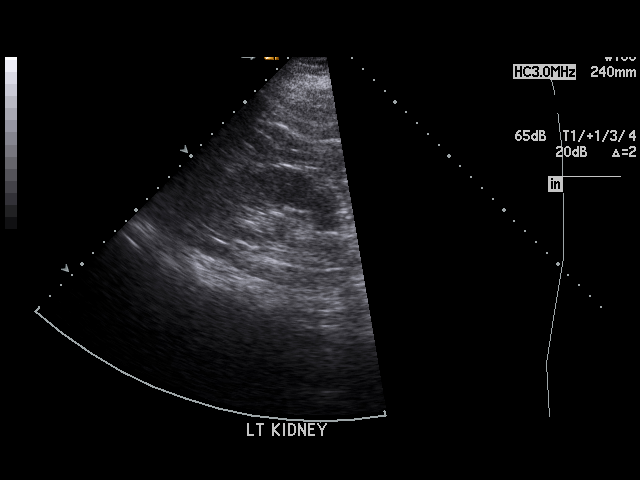
[im 6/27]
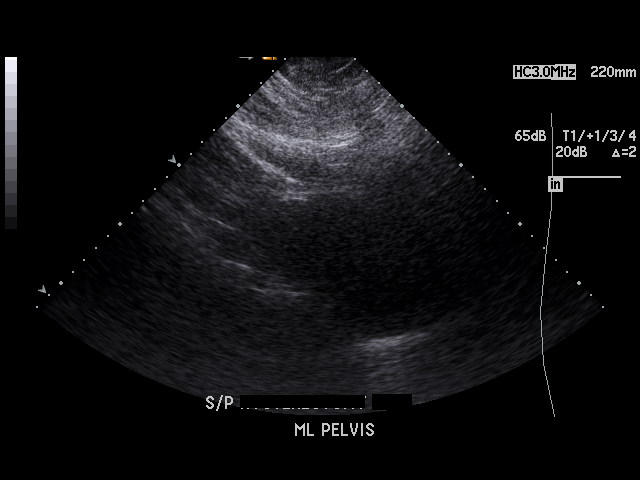
[im 7/27]
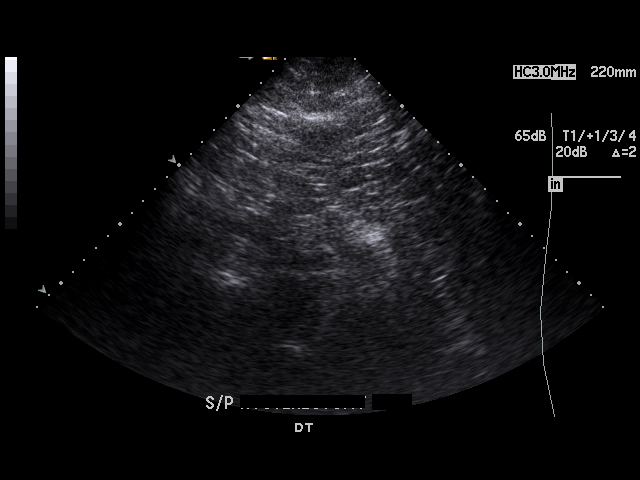
[im 9/27]
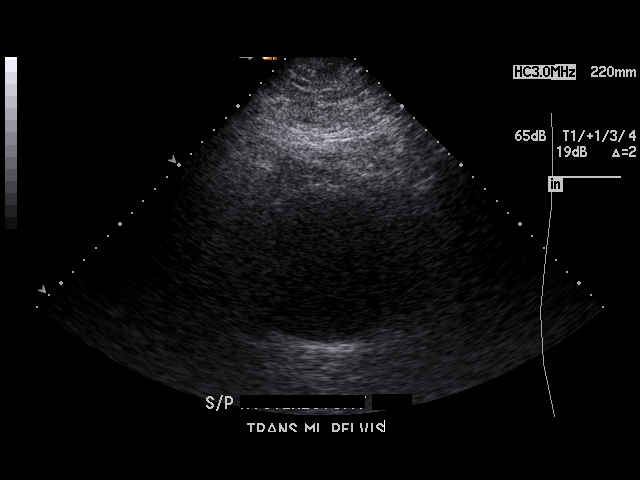
[im 10/27]
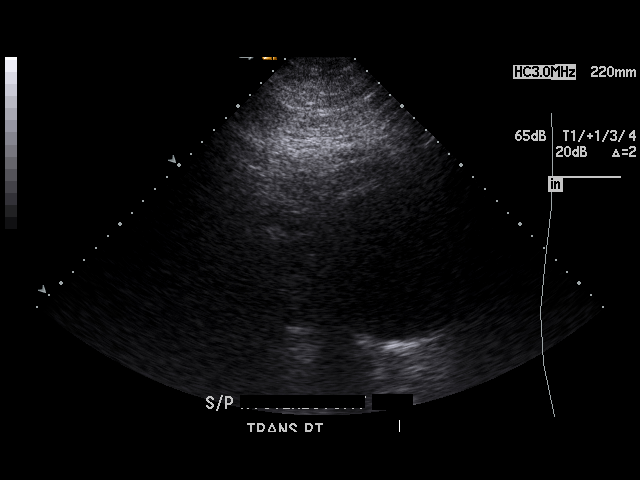
[im 12/27]
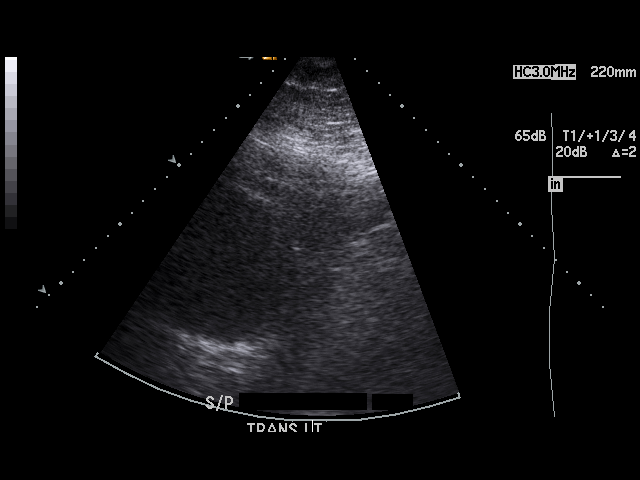
[im 14/27]
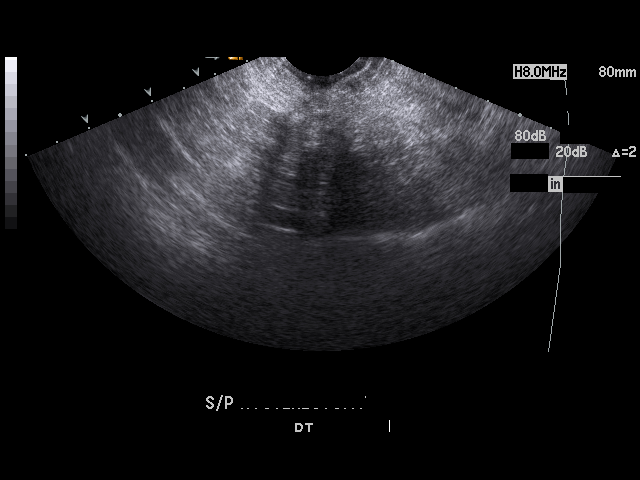
[im 15/27]
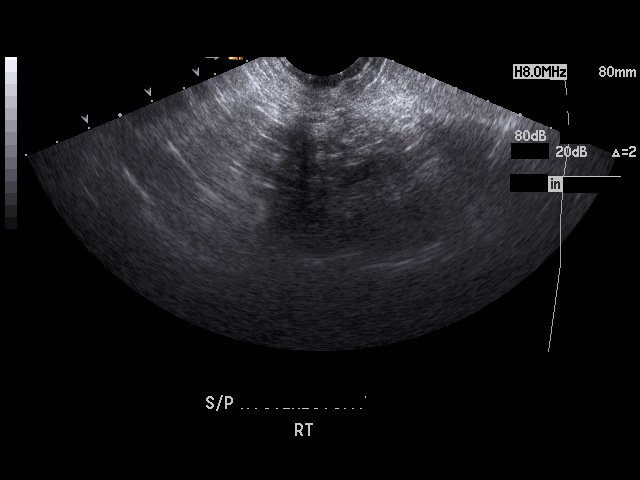
[im 17/27]
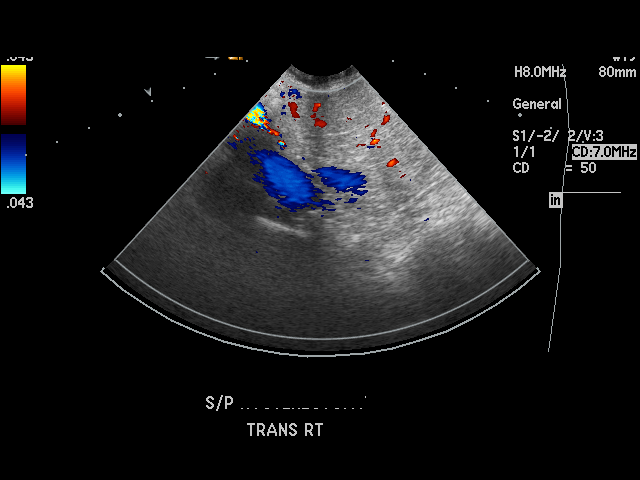
[im 18/27]
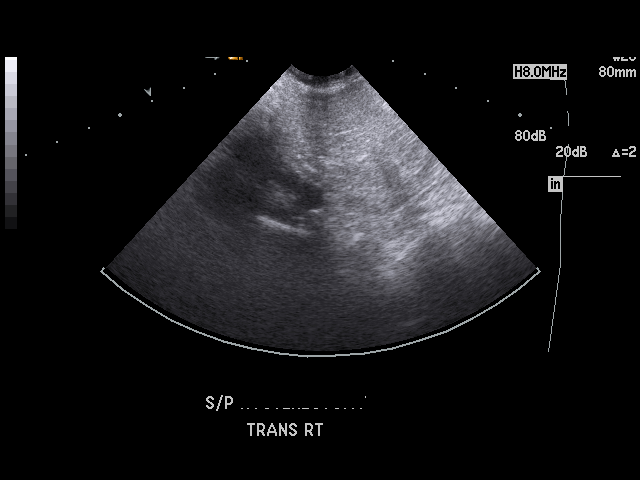
[im 20/27]
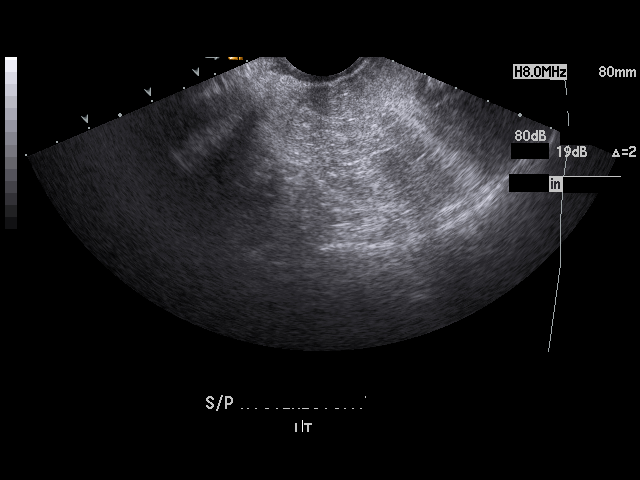
[im 21/27]
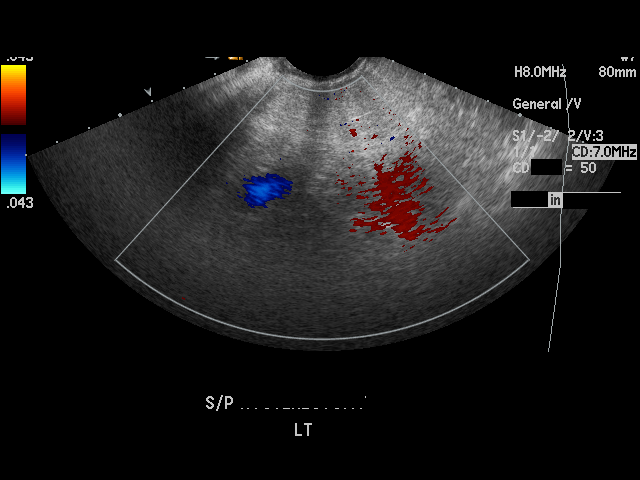
[im 23/27]
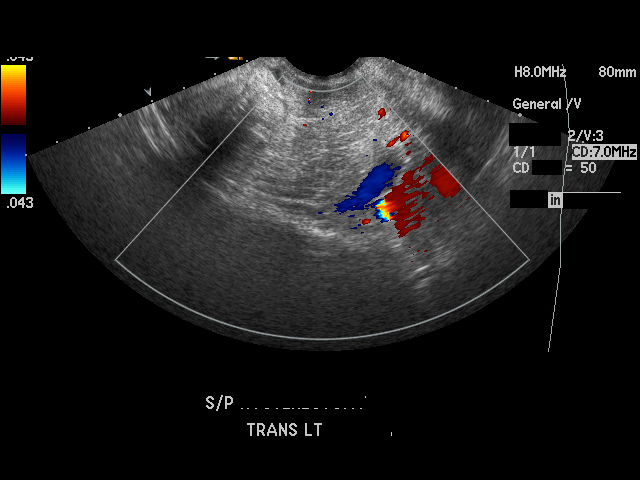
[im 24/27]
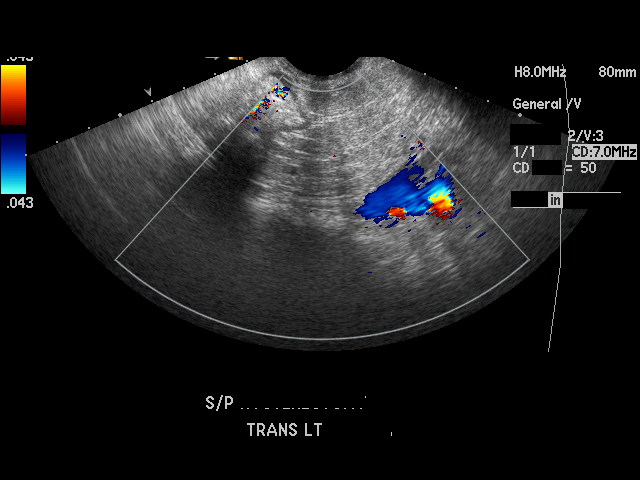
[im 27/27]
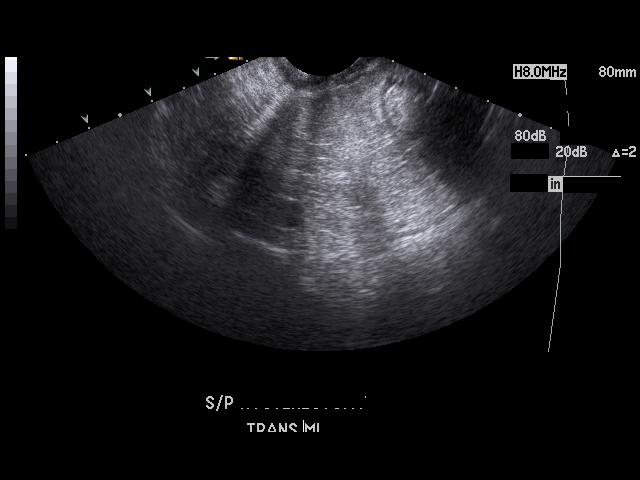

[17 of 25 positions shown; findings below may reference images not displayed]

PROCEDURE:     US  - US PELVIS EXAM W/TRANSVAGINAL  - [DATE]  [DATE]

RESULT:     The uterus and ovaries are not visualized consistent with
interval hysterectomy and bilateral oophorectomy since the prior exam of
[DATE]. No abnormal adnexal masses are seen. There is no free fluid in
the pelvis. The visualized portion of the urinary bladder shows no
significant abnormalities.
IMPRESSION: 1.  No abnormal adnexal masses are seen.
2.  No free fluid is observed in the pelvis.
3.  The kidneys show no hydronephrosis.
4.  The patient is status post total hysterectomy and bilateral oophorectomy.

## 2009-09-29 ENCOUNTER — Emergency Department: Payer: Self-pay

## 2009-09-29 IMAGING — CT CT HEAD WITHOUT CONTRAST
2 series · 16 of 30 positions shown, 20 images · non-contrast
Comparison: none

REASON FOR EXAM: ha pt in er COFFEE
COMMENTS:   May transport without cardiac monitor

PROCEDURE:     CT  - CT HEAD WITHOUT CONTRAST  - [DATE]  [DATE]
RESULT:     Comparison:  None
TECHNIQUE: Multiple axial images from the foramen magnum to the vertex were
obtained without IV contrast.

[Series 2: without · axial · non-contrast · 0.39mm/px · z∈[-238,-108]mm · 13 of 32 slices shown, 17 images]
[im 3/32  brain]
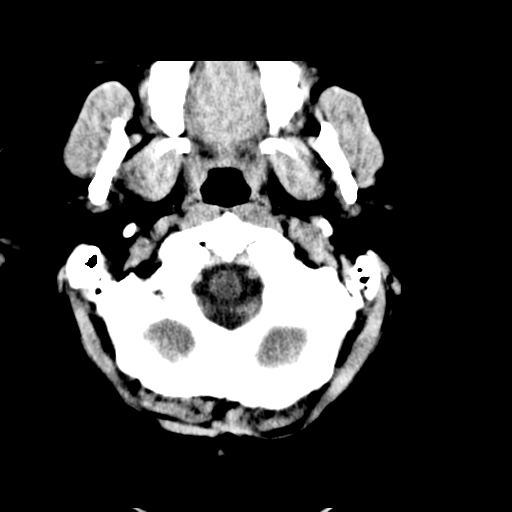
[im 3/32  bone]
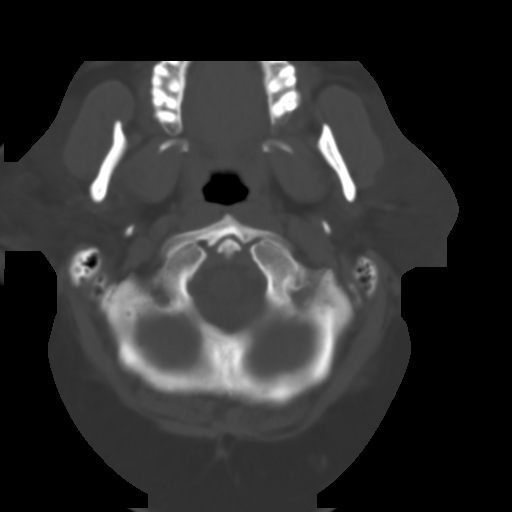
[im 5/32  brain]
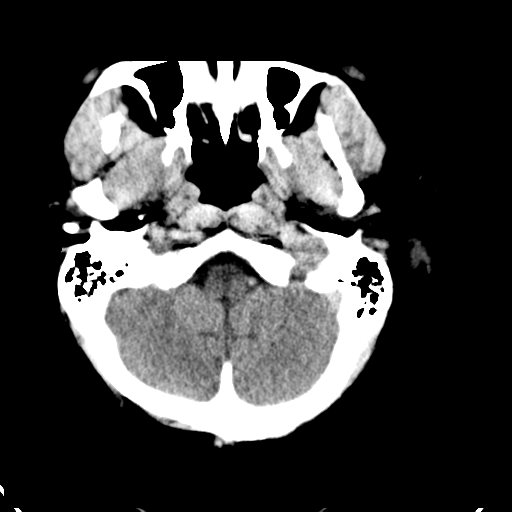
[im 7/32  brain]
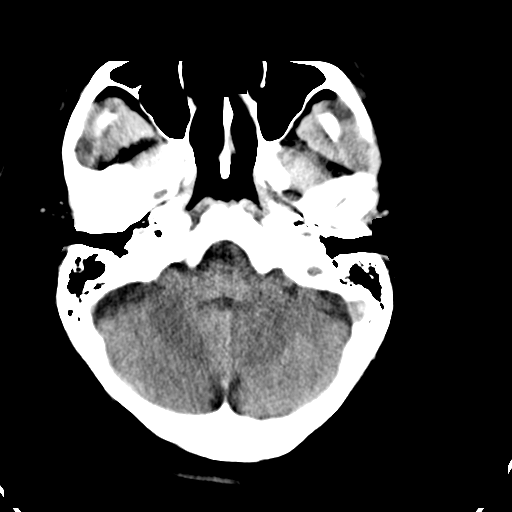
[im 9/32  brain]
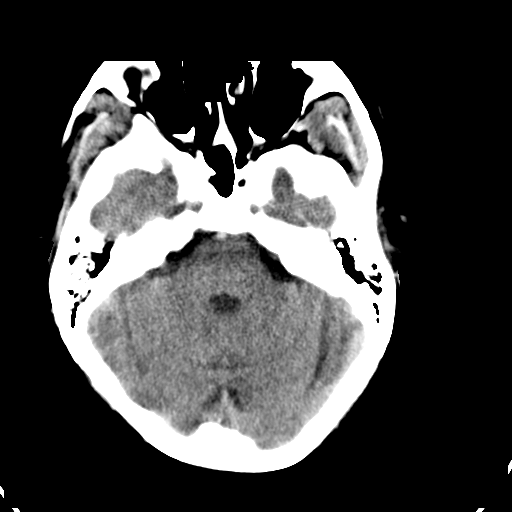
[im 12/32  brain]
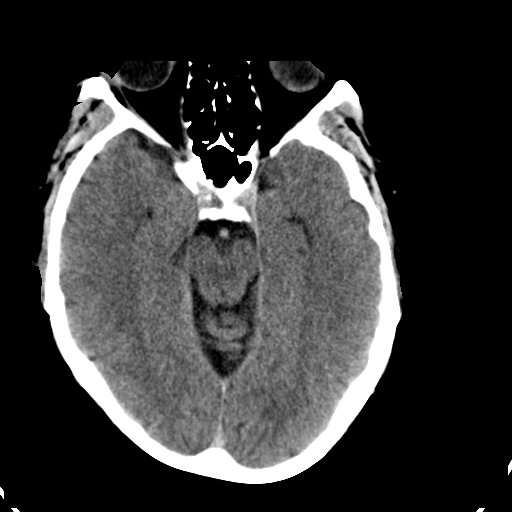
[im 12/32  bone]
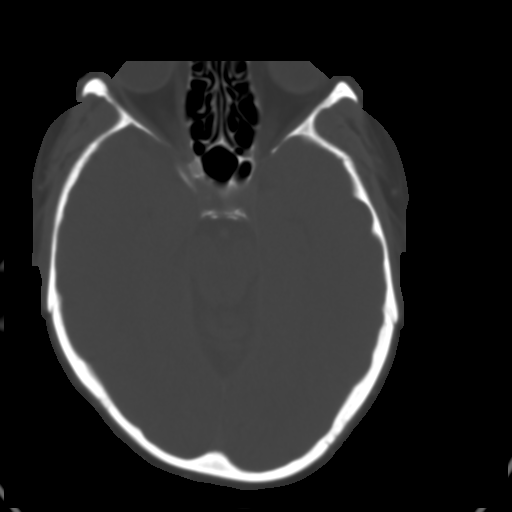
[im 14/32  brain]
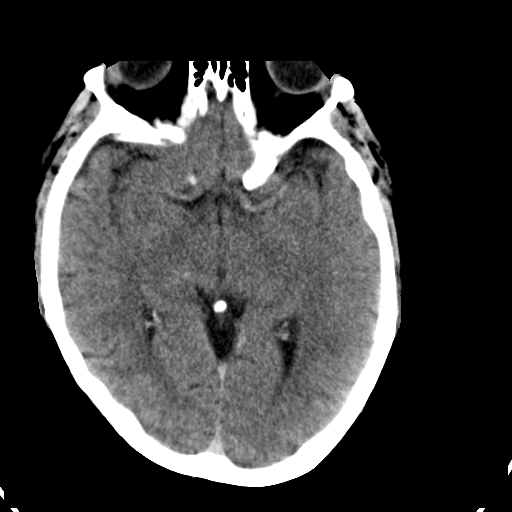
[im 16/32  brain]
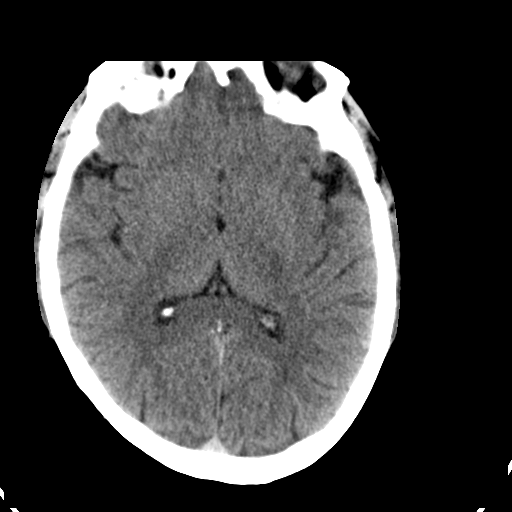
[im 18/32  brain]
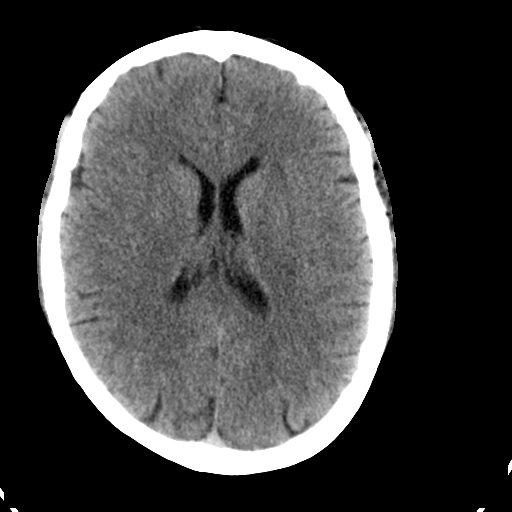
[im 20/32  brain]
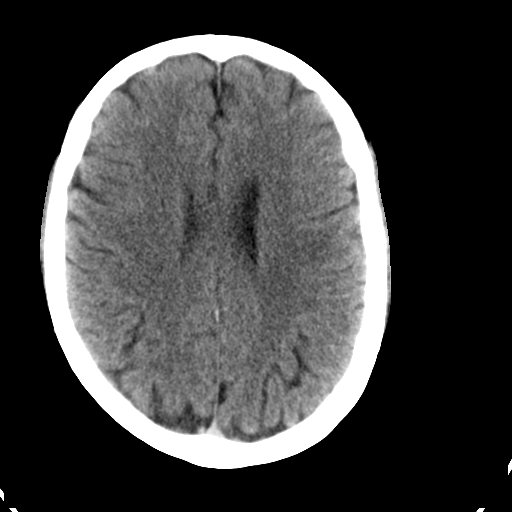
[im 20/32  bone]
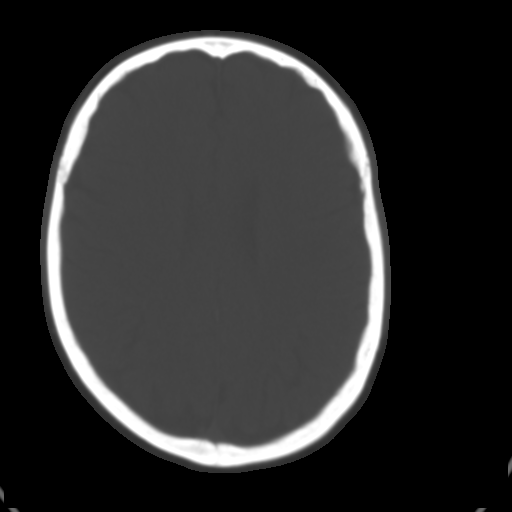
[im 23/32  brain]
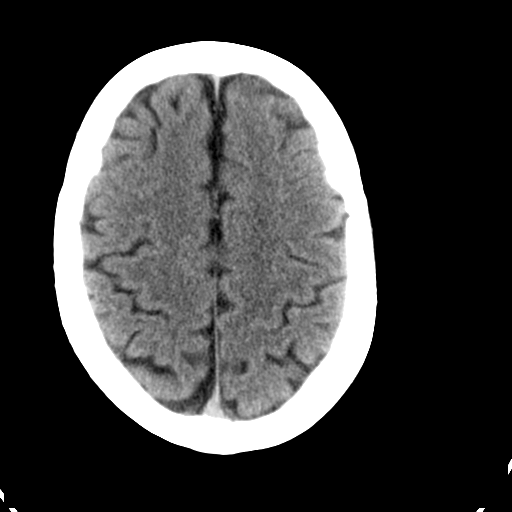
[im 25/32  brain]
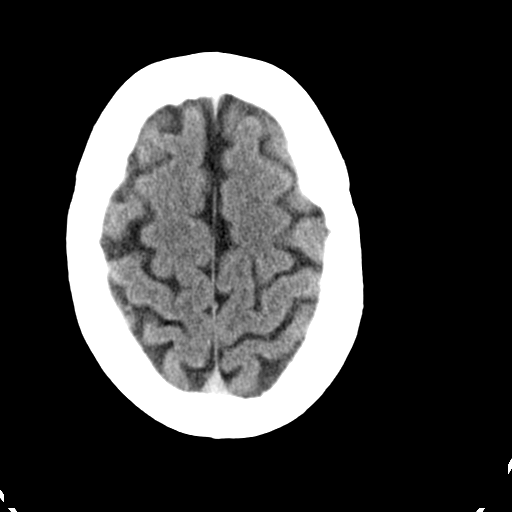
[im 27/32  brain]
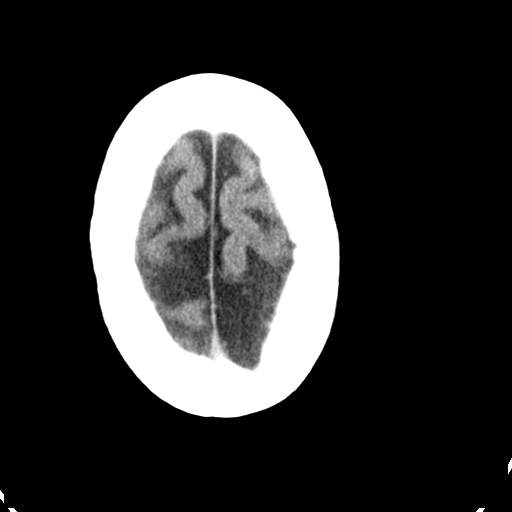
[im 29/32  brain]
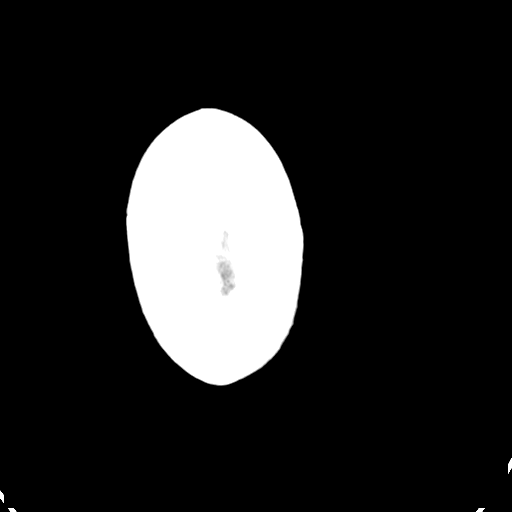
[im 29/32  bone]
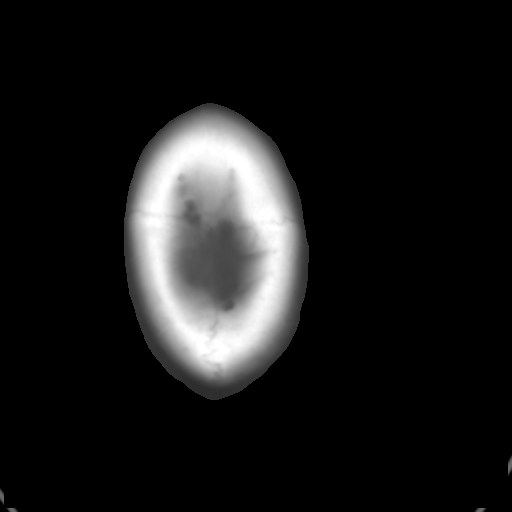

[Series 3: bone · axial · 0.39mm/px · z∈[-238,-194]mm · 3 of 32 slices shown]
[im 3/32  bone]
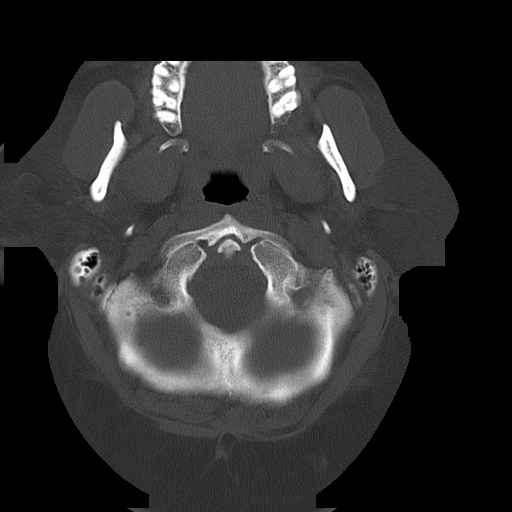
[im 7/32  bone]
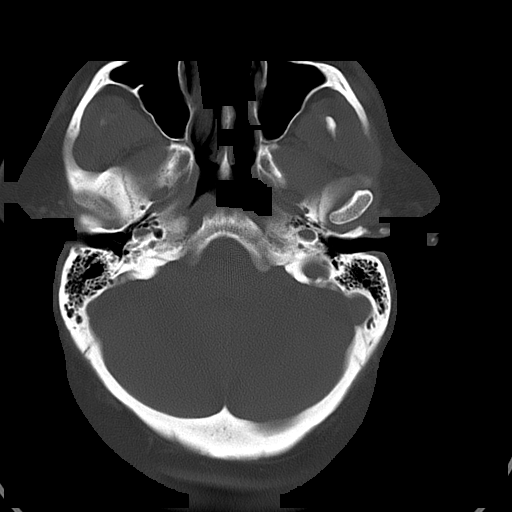
[im 12/32  bone]
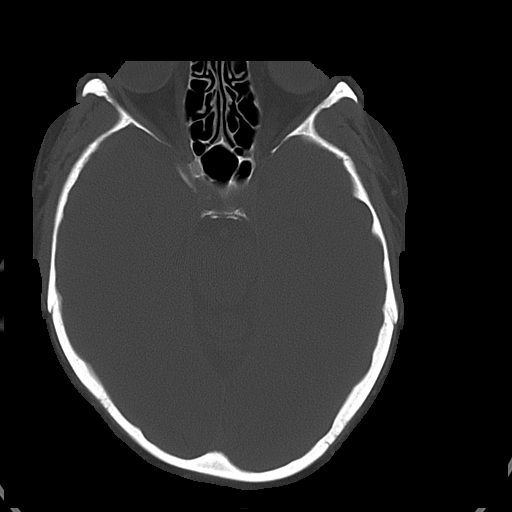

[16 of 30 positions shown; findings below may reference images not displayed]

FINDINGS: There is no evidence of mass effect, midline shift, or extra-axial fluid
collections.  There is no evidence of a space-occupying lesion or
intracranial hemorrhage. There is no evidence of a cortical-based area of
acute infarction.

The ventricles and sulci are appropriate for the patient's age. The basal
cisterns are patent.

Visualized portions of the orbits are unremarkable. The visualized portions
of the paranasal sinuses and mastoid air cells are unremarkable.

The osseous structures are unremarkable.
IMPRESSION: No acute intracranial process.

## 2010-06-20 ENCOUNTER — Emergency Department: Payer: Self-pay | Admitting: Unknown Physician Specialty

## 2010-06-20 IMAGING — CT CT ABD-PELV W/ CM
1 of 2 series · 14 of 32 positions shown, 18 images · non-contrast
Comparison: none

REASON FOR EXAM: (1) right flank/RLQ  pain vomiting; (2) right flank/RLQ
pain vomiting
COMMENTS:

[Series 2: soft tissue · axial · 0.91mm/px · z∈[-582,-135]mm · 14 of 165 slices shown, 18 images]
[im 8/165  soft-tissue]
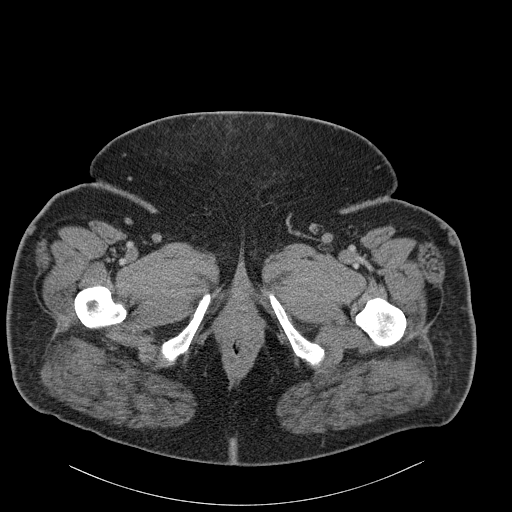
[im 8/165  bone]
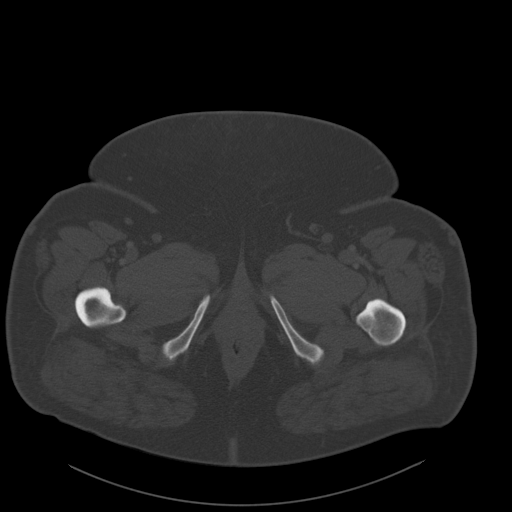
[im 22/165  soft-tissue]
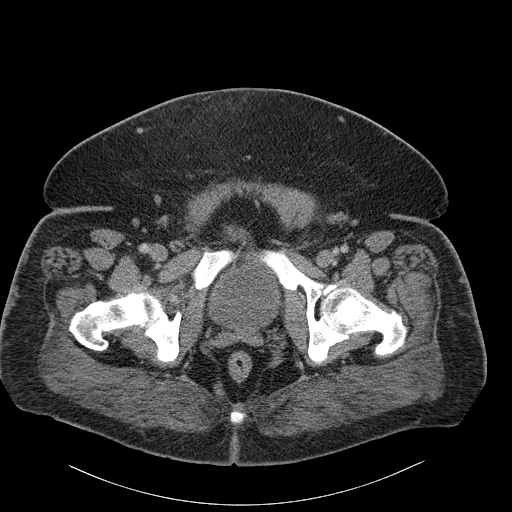
[im 36/165  soft-tissue]
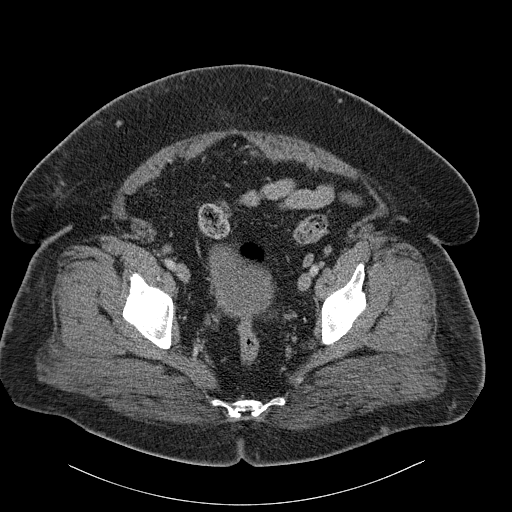
[im 50/165  soft-tissue]
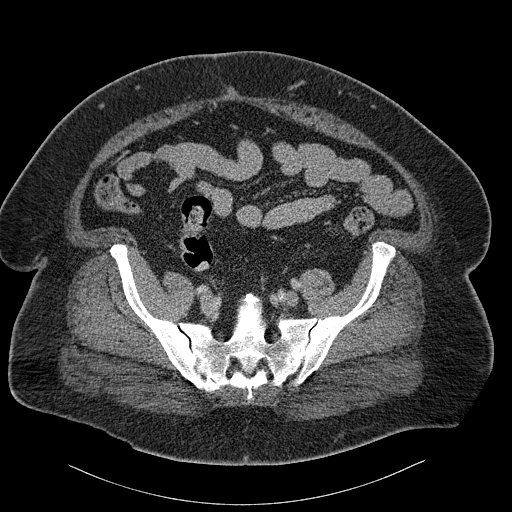
[im 65/165  soft-tissue]
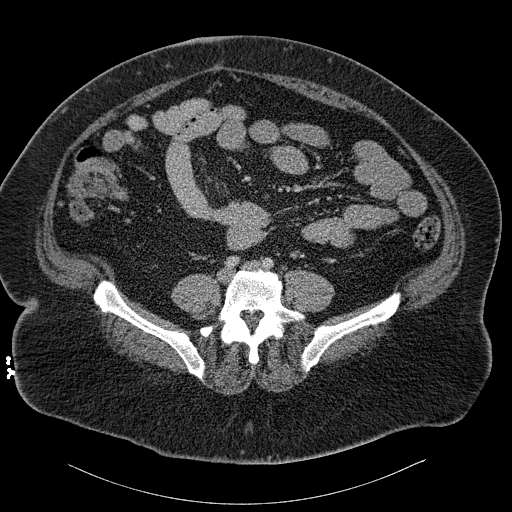
[im 79/165  soft-tissue]
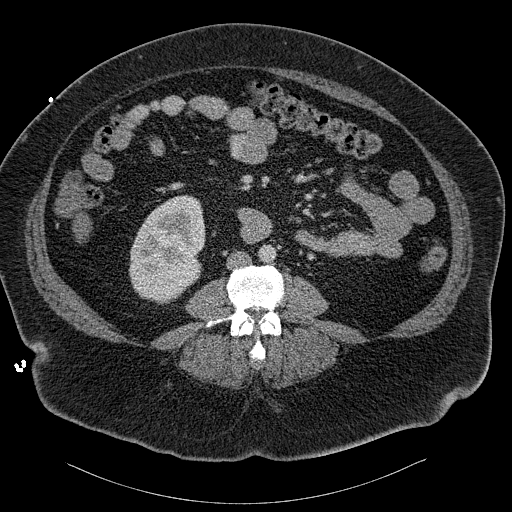
[im 86/165  soft-tissue]
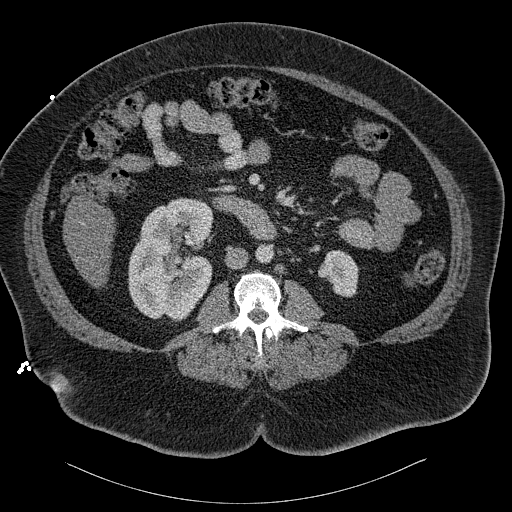
[im 100/165  soft-tissue]
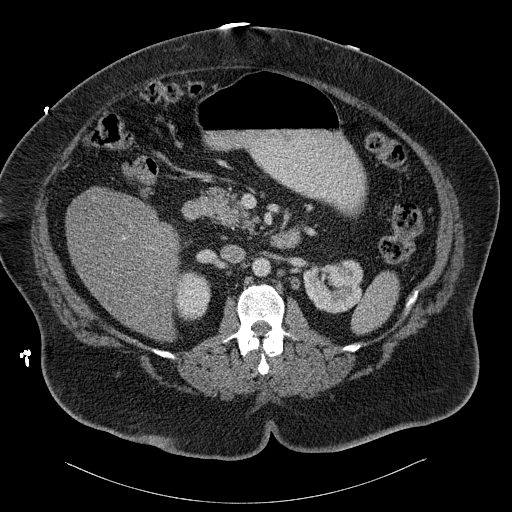
[im 115/165  soft-tissue]
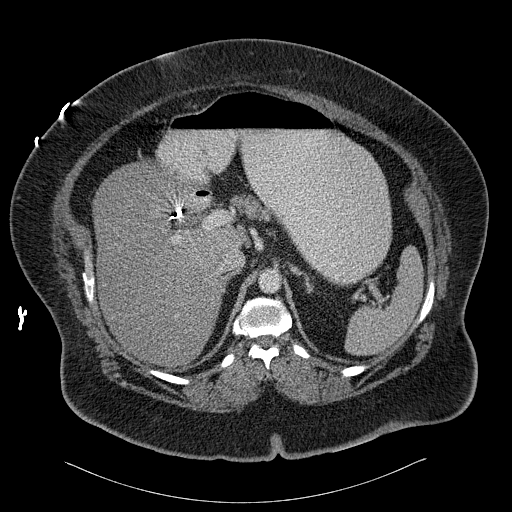
[im 115/165  bone]
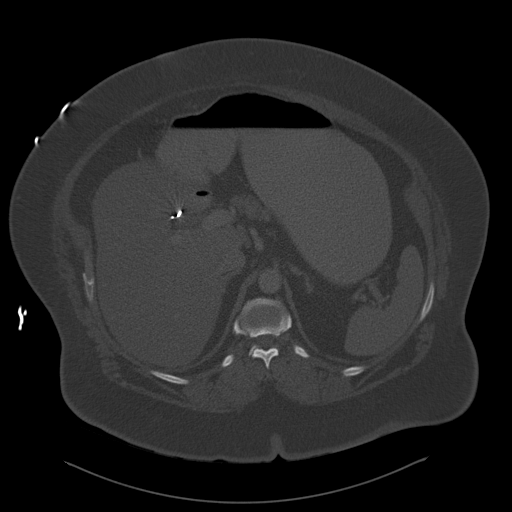
[im 129/165  soft-tissue]
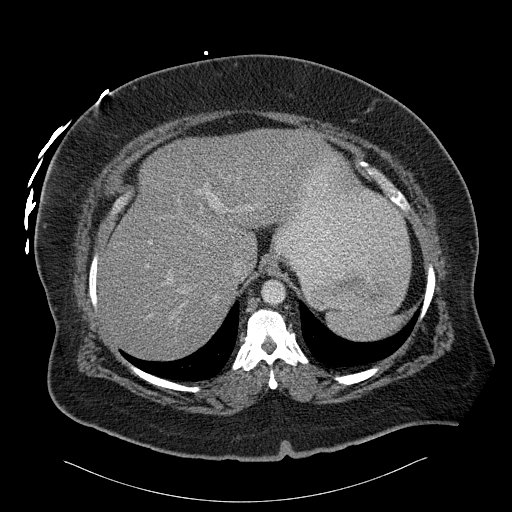
[im 136/165  lung]
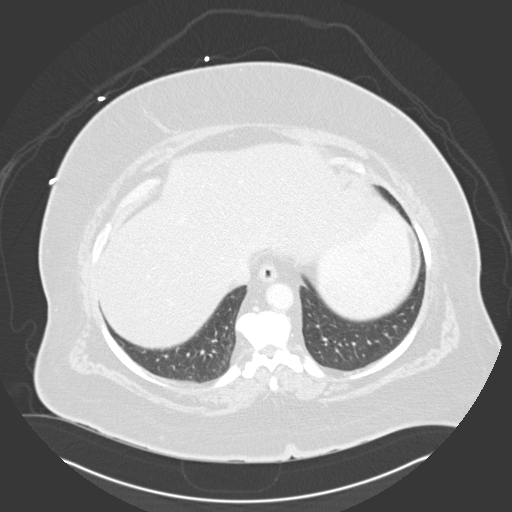
[im 143/165  soft-tissue]
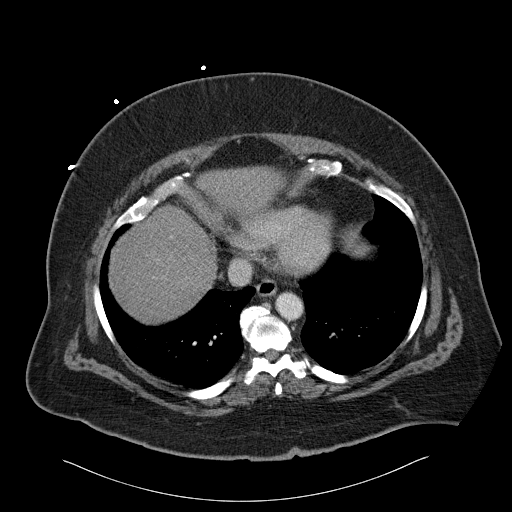
[im 143/165  lung]
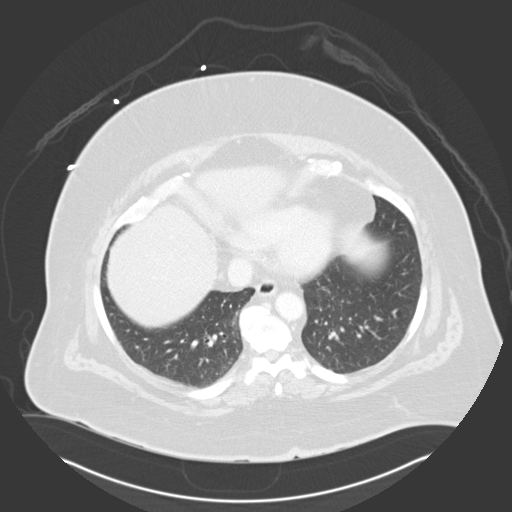
[im 150/165  lung]
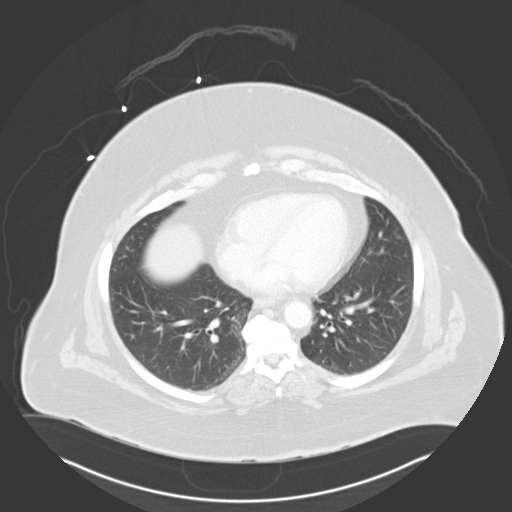
[im 157/165  soft-tissue]
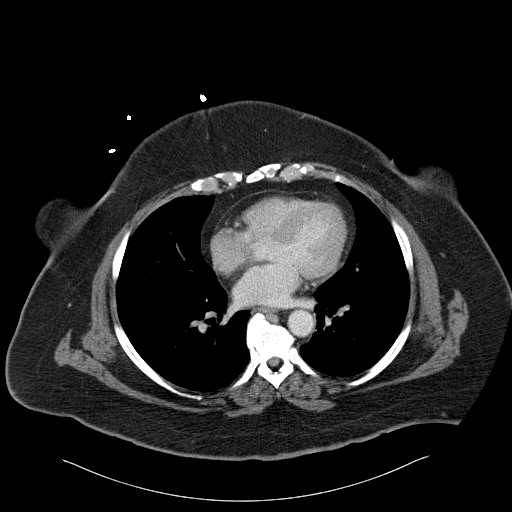
[im 157/165  lung]
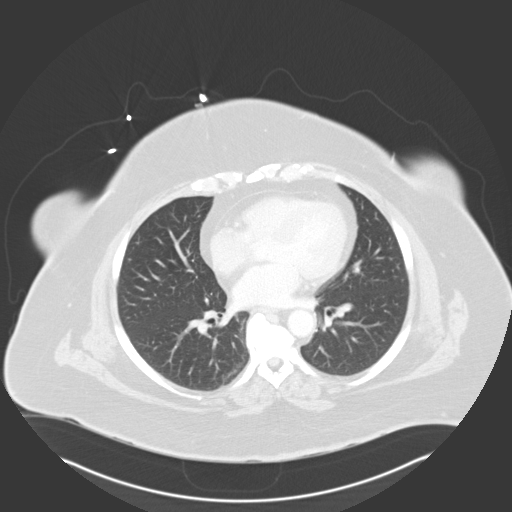

[14 of 32 positions shown; findings below may reference images not displayed]

PROCEDURE:     CT  - CT ABDOMEN / PELVIS  W  - [DATE]  [DATE]

RESULT:     Axial CT scanning was performed through the abdomen and pelvis
at 3 mm intervals and slice thicknesses following intravenous administration
of 100 cc of [OC]. The patient did receive oral contrast material, but
it is quite dilute and the majority lies within the stomach. There are food
particles within the stomach is well. Review of multiplanar reconstructed
images was performed separately on the VIA monitor.

The liver exhibits mildly decreased density consistent with fatty
infiltration. The gallbladder is surgically absent. The spleen, pancreas,
adrenal glands, and kidneys exhibit no acute abnormality. The left kidney is
irregular in contour and may sustained previous infectious episodes. The
right kidney is mildly hypertrophied. In the lower pole of the left kidney
there is an approximately 1 mm diameter nonobstructive calcification. There
are a few mildly enlarged lymph nodes noted anterior to the inferior vena
cava with the largest measuring 2.7 x 1.4 cm.

The caliber of the abdominal aorta is normal. The partially contrast-filled
loops of small bowel exhibit no evidence of ileus nor of obstruction. The
colon is relatively collapsed but exhibits a normal stool and gas pattern. A
few scattered diverticula are noted. The appendix is not discretely
identified but I do not see abnormal findings in the right lower quadrant to
suggest acute appendicitis.

Within the pelvis the partially distended urinary bladder is normal in
appearance. The uterus is surgically absent. I see no adnexal masses nor
free pelvic fluid. The lung bases are clear. The lumbar vertebral bodies are
preserved in height.
IMPRESSION: 1. I do not see evidence of bowel obstruction or ileus. However, the
majority of the patient's oral contrast lies within the stomach. It is quite
dilute. There are food particles in the stomach is well. I do not see
objective evidence of acute appendicitis nor other acute abnormality in the
right lower quadrant of the abdomen.
2. There are chronic changes associated with the left kidney with
compensatory hypertrophy of the right kidney.
3. I do not see evidence of acute hepatobiliary disease. There are fatty
infiltrative changes of the liver.
4. There is no splenomegaly. There is a mildly enlarged precaval lymph node
demonstrated best on image 58. This is unchanged from a CT scan [DATE]. I do not see acute abnormality within the pelvis. Since the previous
study the patient has undergone prior hysterectomy and presumably
salpingo-oophorectomy.

## 2010-08-08 ENCOUNTER — Emergency Department: Payer: Self-pay | Admitting: Emergency Medicine

## 2012-03-12 ENCOUNTER — Ambulatory Visit: Payer: Self-pay | Admitting: Internal Medicine

## 2012-05-20 ENCOUNTER — Inpatient Hospital Stay: Payer: Self-pay | Admitting: Internal Medicine

## 2012-05-20 LAB — URINALYSIS, COMPLETE
Bilirubin,UR: NEGATIVE
Granular Cast: 1
Hyaline Cast: 3
Nitrite: NEGATIVE
Ph: 5 (ref 4.5–8.0)
RBC,UR: 8 /HPF (ref 0–5)
Specific Gravity: 1.025 (ref 1.003–1.030)
Squamous Epithelial: 2

## 2012-05-20 LAB — COMPREHENSIVE METABOLIC PANEL
Albumin: 2.5 g/dL — ABNORMAL LOW (ref 3.4–5.0)
Alkaline Phosphatase: 149 U/L — ABNORMAL HIGH (ref 50–136)
BUN: 17 mg/dL (ref 7–18)
Bilirubin,Total: 0.5 mg/dL (ref 0.2–1.0)
Chloride: 93 mmol/L — ABNORMAL LOW (ref 98–107)
Co2: 23 mmol/L (ref 21–32)
Creatinine: 0.96 mg/dL (ref 0.60–1.30)
EGFR (African American): >60
EGFR (Non-African Amer.): >60
Glucose: 525 mg/dL (ref 65–99)
Potassium: 4.3 mmol/L (ref 3.5–5.1)
SGOT(AST): 13 U/L — ABNORMAL LOW (ref 15–37)
SGPT (ALT): 18 U/L (ref 12–78)
Total Protein: 7.3 g/dL (ref 6.4–8.2)

## 2012-05-20 LAB — CBC
HCT: 42.2 % (ref 35.0–47.0)
HGB: 14.2 g/dL (ref 12.0–16.0)
MCHC: 33.5 g/dL (ref 32.0–36.0)
WBC: 15.2 10*3/uL — ABNORMAL HIGH (ref 3.6–11.0)

## 2012-05-21 LAB — COMPREHENSIVE METABOLIC PANEL
Alkaline Phosphatase: 126 U/L (ref 50–136)
Anion Gap: 12 (ref 7–16)
BUN: 24 mg/dL — ABNORMAL HIGH (ref 7–18)
Calcium, Total: 8.6 mg/dL (ref 8.5–10.1)
Co2: 21 mmol/L (ref 21–32)
EGFR (Non-African Amer.): 41 — ABNORMAL LOW
Osmolality: 286 (ref 275–301)
Potassium: 4.2 mmol/L (ref 3.5–5.1)
SGOT(AST): 20 U/L (ref 15–37)
Sodium: 134 mmol/L — ABNORMAL LOW (ref 136–145)

## 2012-05-21 LAB — CBC WITH DIFFERENTIAL/PLATELET
Basophil #: 0 10*3/uL (ref 0.0–0.1)
Basophil %: 0.4 %
Eosinophil %: 1.2 %
HGB: 12.5 g/dL (ref 12.0–16.0)
Lymphocyte #: 1.2 10*3/uL (ref 1.0–3.6)
Lymphocyte %: 9.4 %
MCH: 28.7 pg (ref 26.0–34.0)
MCV: 88 fL (ref 80–100)
Monocyte %: 8.7 %
Neutrophil #: 10 10*3/uL — ABNORMAL HIGH (ref 1.4–6.5)
WBC: 12.4 10*3/uL — ABNORMAL HIGH (ref 3.6–11.0)

## 2012-05-21 LAB — URINE CULTURE

## 2012-05-21 LAB — PROTIME-INR: INR: 1.2

## 2012-05-22 LAB — BASIC METABOLIC PANEL
Anion Gap: 9 (ref 7–16)
BUN: 22 mg/dL — ABNORMAL HIGH (ref 7–18)
Calcium, Total: 8.8 mg/dL (ref 8.5–10.1)
Co2: 24 mmol/L (ref 21–32)
EGFR (African American): 48 — ABNORMAL LOW
EGFR (Non-African Amer.): 41 — ABNORMAL LOW
Glucose: 258 mg/dL — ABNORMAL HIGH (ref 65–99)

## 2012-05-22 LAB — VANCOMYCIN, TROUGH: Vancomycin, Trough: 5 ug/mL — ABNORMAL LOW (ref 10–20)

## 2012-05-22 LAB — CBC WITH DIFFERENTIAL/PLATELET
Eosinophil #: 0.2 10*3/uL (ref 0.0–0.7)
HCT: 36.9 % (ref 35.0–47.0)
HGB: 12.7 g/dL (ref 12.0–16.0)
Lymphocyte %: 19.5 %
MCV: 88 fL (ref 80–100)
Monocyte #: 0.9 x10 3/mm (ref 0.2–0.9)
Neutrophil %: 69.1 %
RDW: 13.3 % (ref 11.5–14.5)
WBC: 10 10*3/uL (ref 3.6–11.0)

## 2012-05-23 LAB — VANCOMYCIN, TROUGH: Vancomycin, Trough: 13 ug/mL (ref 10–20)

## 2012-05-23 LAB — CBC WITH DIFFERENTIAL/PLATELET
Basophil %: 0.6 %
Eosinophil %: 2.9 %
HGB: 12.4 g/dL (ref 12.0–16.0)
Lymphocyte #: 1.4 10*3/uL (ref 1.0–3.6)
MCV: 88 fL (ref 80–100)
Monocyte %: 11.2 %
Neutrophil #: 4.5 10*3/uL (ref 1.4–6.5)
Neutrophil %: 65.5 %
Platelet: 298 10*3/uL (ref 150–440)
RBC: 4.06 10*6/uL (ref 3.80–5.20)
WBC: 6.9 10*3/uL (ref 3.6–11.0)

## 2012-05-23 LAB — BASIC METABOLIC PANEL
Anion Gap: 9 (ref 7–16)
Calcium, Total: 8.8 mg/dL (ref 8.5–10.1)
Chloride: 108 mmol/L — ABNORMAL HIGH (ref 98–107)
Co2: 23 mmol/L (ref 21–32)
Creatinine: 1.18 mg/dL (ref 0.60–1.30)
Osmolality: 292 (ref 275–301)
Potassium: 5.4 mmol/L — ABNORMAL HIGH (ref 3.5–5.1)

## 2012-05-24 LAB — CBC WITH DIFFERENTIAL/PLATELET
Basophil #: 0.1 10*3/uL (ref 0.0–0.1)
Basophil %: 1 %
Eosinophil #: 0.2 10*3/uL (ref 0.0–0.7)
HCT: 36.2 % (ref 35.0–47.0)
HGB: 12 g/dL (ref 12.0–16.0)
Lymphocyte #: 1.6 10*3/uL (ref 1.0–3.6)
MCH: 29 pg (ref 26.0–34.0)
MCV: 87 fL (ref 80–100)
Monocyte #: 0.8 x10 3/mm (ref 0.2–0.9)
Platelet: 303 10*3/uL (ref 150–440)
RDW: 13.4 % (ref 11.5–14.5)
WBC: 7.8 10*3/uL (ref 3.6–11.0)

## 2012-05-24 LAB — BASIC METABOLIC PANEL
Anion Gap: 8 (ref 7–16)
BUN: 16 mg/dL (ref 7–18)
Chloride: 107 mmol/L (ref 98–107)
Creatinine: 1.1 mg/dL (ref 0.60–1.30)
EGFR (African American): >60
EGFR (Non-African Amer.): 54 — ABNORMAL LOW
Glucose: 174 mg/dL — ABNORMAL HIGH (ref 65–99)
Osmolality: 285 (ref 275–301)
Potassium: 4 mmol/L (ref 3.5–5.1)
Sodium: 140 mmol/L (ref 136–145)

## 2012-05-24 LAB — VANCOMYCIN, TROUGH: Vancomycin, Trough: 20 ug/mL (ref 10–20)

## 2012-05-25 LAB — BASIC METABOLIC PANEL
Anion Gap: 8 (ref 7–16)
Calcium, Total: 9 mg/dL (ref 8.5–10.1)
Chloride: 105 mmol/L (ref 98–107)
Co2: 28 mmol/L (ref 21–32)
Creatinine: 1.16 mg/dL (ref 0.60–1.30)
EGFR (African American): 58 — ABNORMAL LOW
Glucose: 126 mg/dL — ABNORMAL HIGH (ref 65–99)
Sodium: 141 mmol/L (ref 136–145)

## 2012-05-25 LAB — CBC WITH DIFFERENTIAL/PLATELET
Basophil #: 0.1 10*3/uL (ref 0.0–0.1)
Eosinophil #: 0.2 10*3/uL (ref 0.0–0.7)
Lymphocyte #: 1.9 10*3/uL (ref 1.0–3.6)
Lymphocyte %: 23.3 %
MCHC: 33.3 g/dL (ref 32.0–36.0)
MCV: 88 fL (ref 80–100)
Monocyte %: 9.7 %
Neutrophil #: 5.3 10*3/uL (ref 1.4–6.5)
Neutrophil %: 63.4 %
Platelet: 319 10*3/uL (ref 150–440)
RBC: 4.17 10*6/uL (ref 3.80–5.20)
RDW: 13.1 % (ref 11.5–14.5)
WBC: 8.3 10*3/uL (ref 3.6–11.0)

## 2014-02-23 ENCOUNTER — Other Ambulatory Visit: Payer: Self-pay

## 2014-02-23 LAB — D-DIMER(ARMC): D-Dimer: 585 ng/ml

## 2014-02-25 ENCOUNTER — Ambulatory Visit: Payer: Self-pay

## 2014-02-25 IMAGING — CT CT ANGIO CHEST
2 of 6 series · 18 of 36 positions shown · IV contrast (isovue)
Comparison: None.

CLINICAL DATA: Chest pain, shortness of breath and elevated
D-dimer.

EXAM:
CT ANGIOGRAPHY CHEST WITH CONTRAST
TECHNIQUE: Multidetector CT imaging of the chest was performed using the
standard protocol during bolus administration of intravenous
contrast. Multiplanar CT image reconstructions and MIPs were
obtained to evaluate the vascular anatomy.
CONTRAST:  100 mL Isovue 370 IV

[Series 6: pe 1.0 thins · axial · 0.80mm/px · z∈[+716,+992]mm · 17 of 312 slices shown]
[im 18/312  lung]
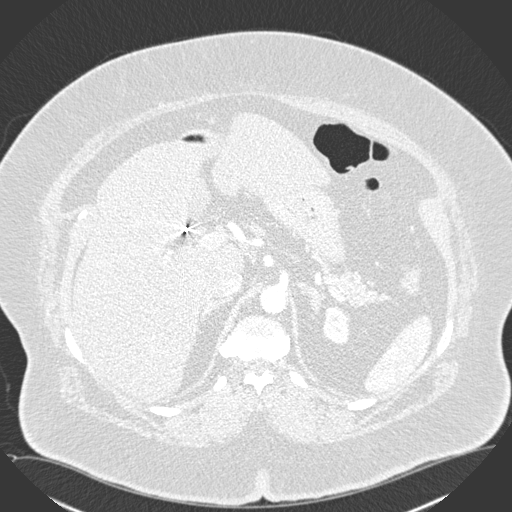
[im 35/312  mediastinal]
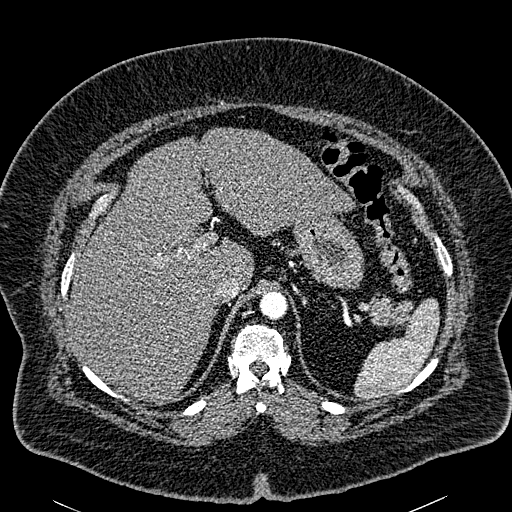
[im 52/312  lung]
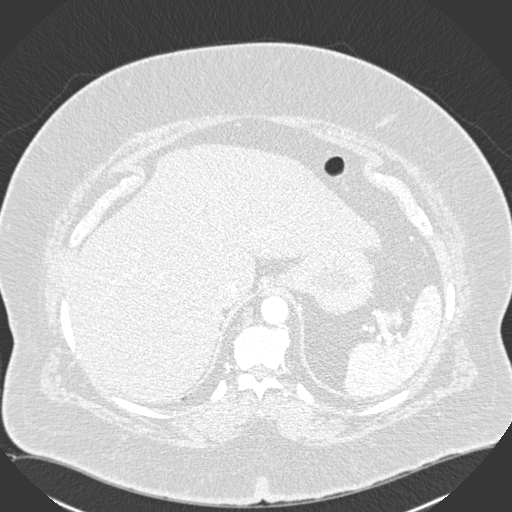
[im 70/312  mediastinal]
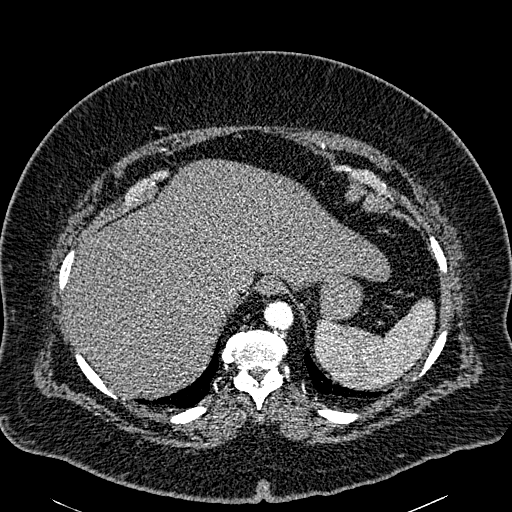
[im 87/312  lung]
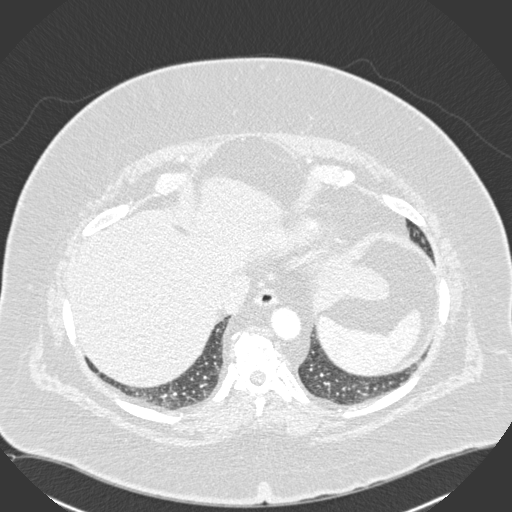
[im 104/312  mediastinal]
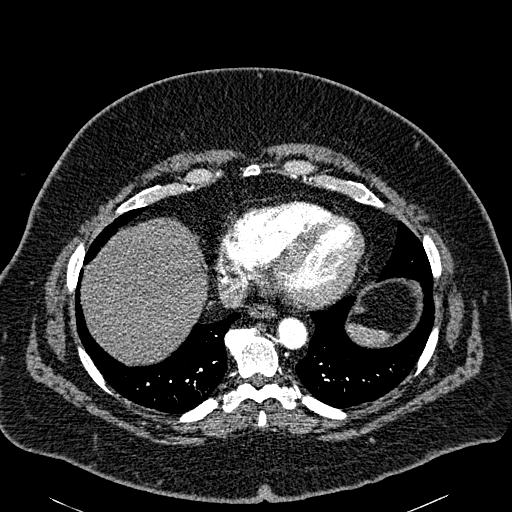
[im 121/312  lung]
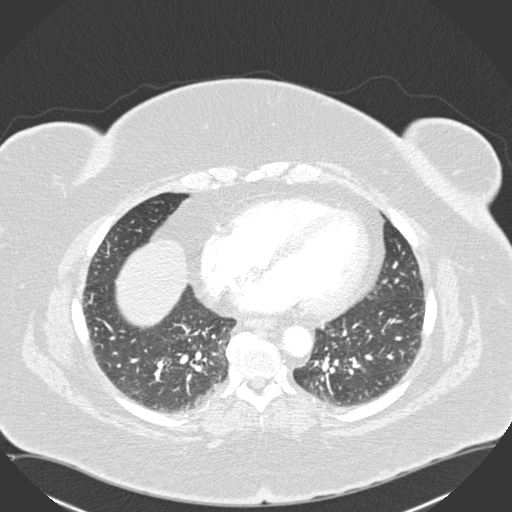
[im 139/312  mediastinal]
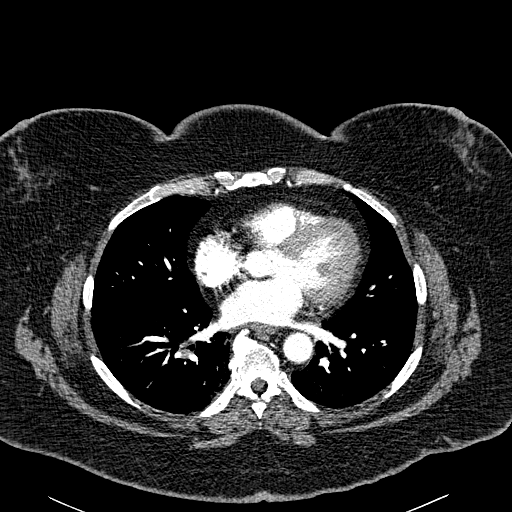
[im 156/312  lung]
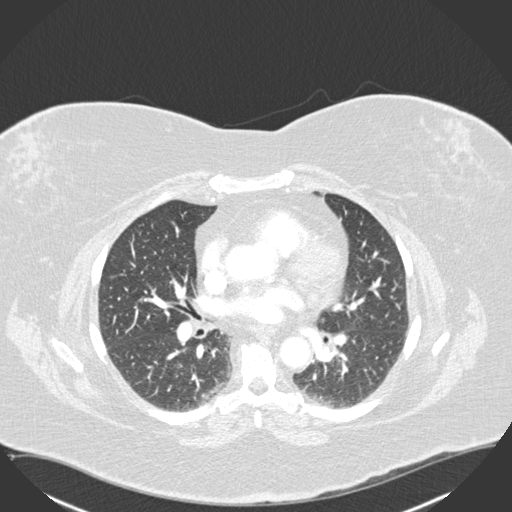
[im 173/312  mediastinal]
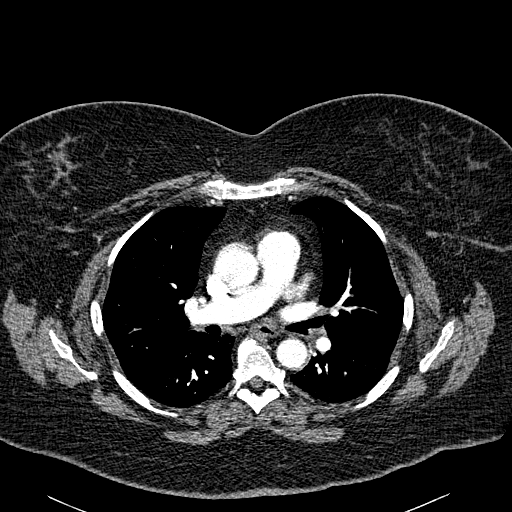
[im 191/312  lung]
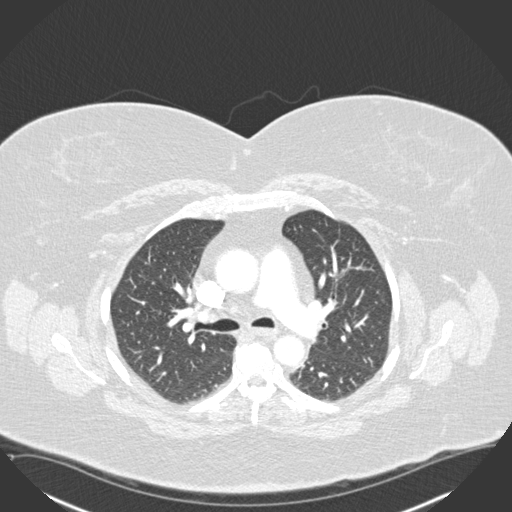
[im 208/312  mediastinal]
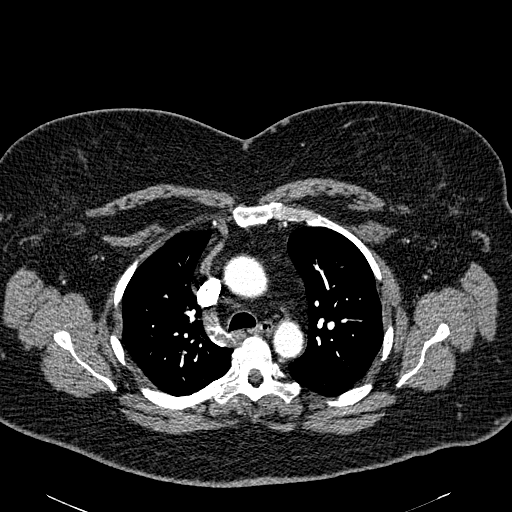
[im 225/312  lung]
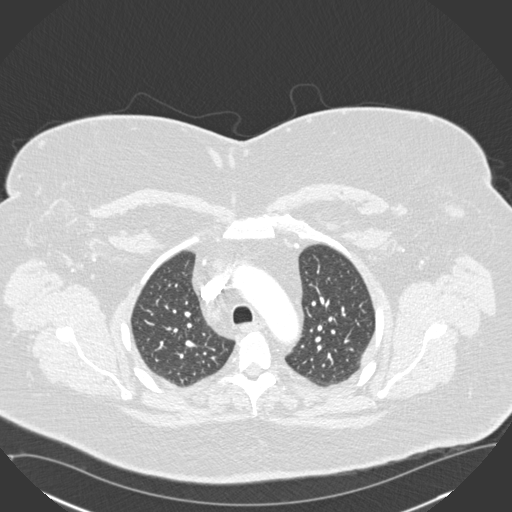
[im 242/312  mediastinal]
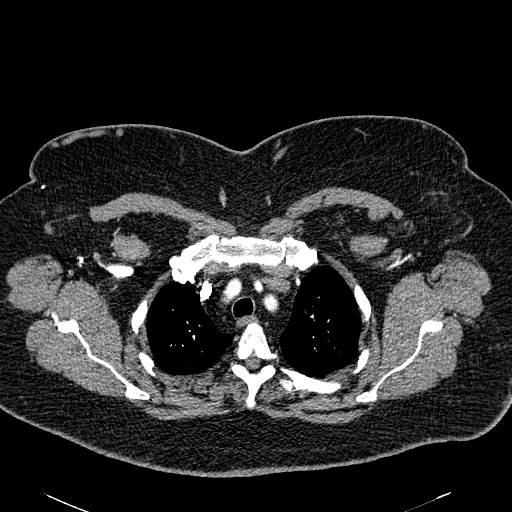
[im 260/312  lung]
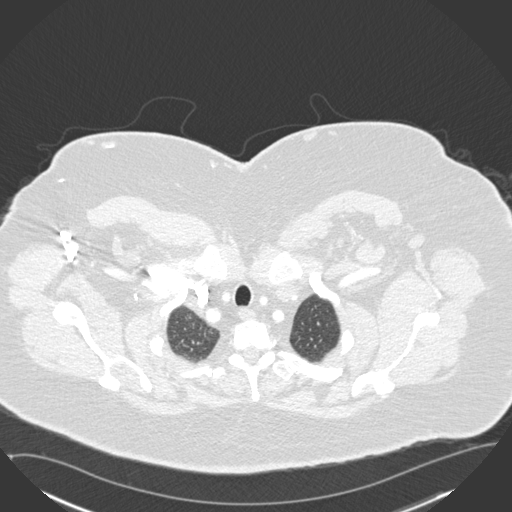
[im 277/312  mediastinal]
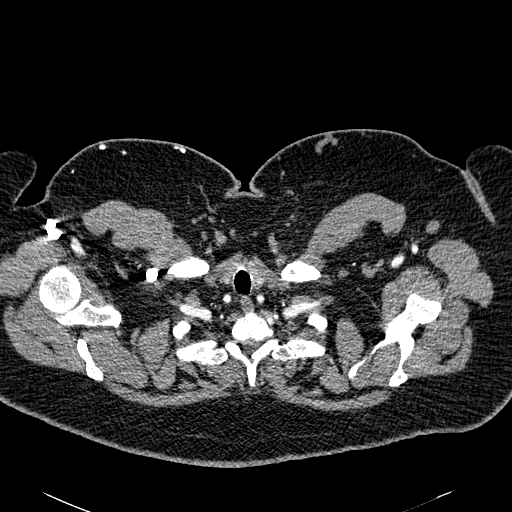
[im 294/312  lung]
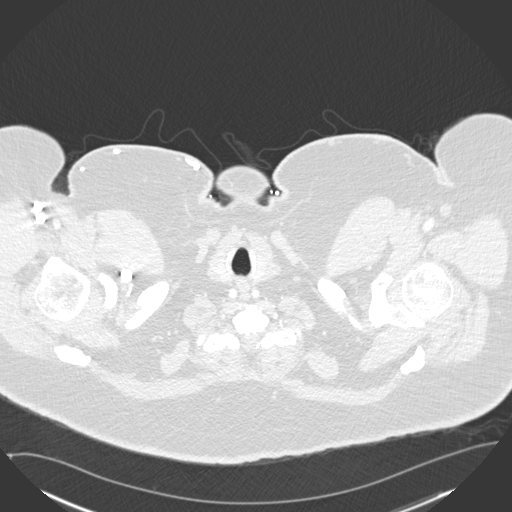

[Series 8: cor pe 2.0 mpr · coronal · 0.62mm/px · 1 of 165 slices shown]
[im 83/165  mediastinal]
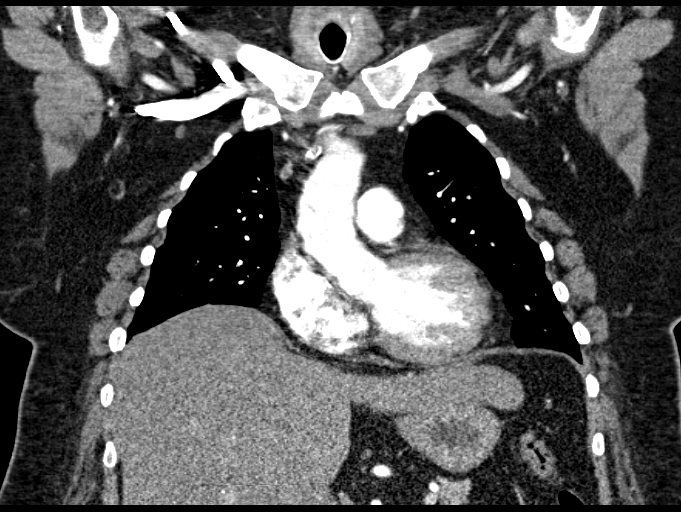

[18 of 36 positions shown; findings below may reference images not displayed]

FINDINGS: The pulmonary arteries are adequately opacified. There is no
evidence of pulmonary embolism. The thoracic aorta is also well
opacified and shows normal caliber without aneurysm or dissection.
No mediastinal abnormalities are seen. There is no evidence of mass
or enlarged lymph nodes. The heart size is normal.

No pleural or pericardial fluid is seen. There is no evidence of
pulmonary edema, consolidation or pneumothorax. Visualized upper
abdomen shows steatosis of the liver. Mild degenerative changes are
seen throughout the thoracic spine. No fractures or bony lesions are
identified.

Review of the MIP images confirms the above findings.
IMPRESSION: No evidence of pulmonary embolism or other acute findings.

## 2014-03-10 DIAGNOSIS — I5032 Chronic diastolic (congestive) heart failure: Secondary | ICD-10-CM | POA: Insufficient documentation

## 2014-03-10 DIAGNOSIS — G4730 Sleep apnea, unspecified: Secondary | ICD-10-CM | POA: Insufficient documentation

## 2014-03-10 DIAGNOSIS — R0602 Shortness of breath: Secondary | ICD-10-CM | POA: Insufficient documentation

## 2014-03-10 DIAGNOSIS — E785 Hyperlipidemia, unspecified: Secondary | ICD-10-CM | POA: Diagnosis present

## 2014-03-10 DIAGNOSIS — Z8679 Personal history of other diseases of the circulatory system: Secondary | ICD-10-CM | POA: Insufficient documentation

## 2014-06-12 DIAGNOSIS — E1149 Type 2 diabetes mellitus with other diabetic neurological complication: Secondary | ICD-10-CM | POA: Insufficient documentation

## 2014-06-16 DIAGNOSIS — F3342 Major depressive disorder, recurrent, in full remission: Secondary | ICD-10-CM | POA: Insufficient documentation

## 2014-12-15 NOTE — Discharge Summary (Signed)
PATIENT NAME:  Judith Shaw, Judith Shaw MR#:  [id number] DATE OF BIRTH:  1950/11/05  DATE OF ADMISSION:  05/20/2012 DATE OF DISCHARGE:  05/25/2012  DISCHARGE DIAGNOSES:  1. Abdominal wall abscess. 2. Type 2 diabetes, out of control.  3. Hypotension with a history of hypertension, now controlled.  4. Morbid obesity.  DISCHARGE MEDICATIONS:  New medications:  1. Augmentin 875 b.i.d. for 10 days.  2. Humalog pen 15 units with meals.  Hold medications: Ramipril 5 mg daily, Lantus pen 60 units daily, and metformin XR 1000 mg b.i.d.    HISTORY AND PHYSICAL: Please see detailed History and Physical on admission.   HOSPITAL COURSE: The patient was admitted with an abdominal wall abscess. She was seen by surgery. Dr. Pat Patrick drained this and put a wound VAC on there. She was treated with vancomycin and Zosyn. Vancomycin was stopped at discharge as all her cultures were negative for MRSA or other resistant organisms. She was improving as well. She was markedly hyperglycemic and had been off her insulin for at least a few weeks. She had her Lantus added back as well as Humalog and this gradually came down. It came down to a very well-controlled range once the metformin was then added as well. She will going home with a wound VAC with early followup with Dr. Pat Patrick.  Last glucose 129 on fingerstick, 126 on a blood draw that morning. White blood cell count was down to 8.3 in the normal range. Urinalysis was normal.  Mixed bacterial organisms on urine culture were found. Glucose was 530 and 507 on admission and white count was 15.2 on admission. Blood cultures were negative. She will be seen in our office soon in followup.   ____________________________ Ocie Cornfield. Ouida Sills, MD [id number] D:  05/26/2012 11:49:51 ET          T: 05/27/2012 10:02:45 ET         JOB#: [id number] Kirk Ruths MD ELECTRONICALLY SIGNED 05/28/2012 7:42

## 2014-12-15 NOTE — Op Note (Signed)
PATIENT NAME:  Judith Shaw, Judith Shaw MR#:  [id number] DATE OF BIRTH:  08-21-1951  DATE OF PROCEDURE:  05/21/2012  PREOPERATIVE DIAGNOSIS: Abdominal wall abscess.   POSTOPERATIVE DIAGNOSIS: Abdominal wall abscess.   OPERATION: Incision and drainage with abdominal wall debridement, sharp debridement with subcutaneous tissue and skin.   SURGEON: Rodena Goldmann, III, MD    ANESTHESIA: General.   OPERATIVE PROCEDURE: With the patient in the supine position after induction of appropriate general anesthesia, the patient's abdomen was prepped with Betadine and draped with sterile towels. The eschar over the abscess was removed. A small amount of purulent material was identified but there did appear to be a deep abscess cavity. There was a large amount of necrotic abdominal wall tissue. Significant debridement was accomplished down to the fascia but muscle was not involved. A Wound VAC was then placed using white foam into the tunneled area and black foam on top. The patient was awakened and returned to the recovery room in satisfactory condition.   ____________________________ Micheline Maze, MD [id number] D: 05/21/2012 10:50:13 ET T: 05/21/2012 13:18:40 ET JOB#: [id number]  cc: Micheline Maze, MD, <Dictator> Ocie Cornfield. Ouida Sills, MD Rodena Goldmann MD ELECTRONICALLY SIGNED 05/22/2012 18:25

## 2014-12-15 NOTE — H&P (Signed)
PATIENT NAME:  Judith Shaw, GRUNER MR#:  [id number] DATE OF BIRTH:  [birth year]  DATE OF ADMISSION:  05/20/2012  REFERRING PHYSICIAN: Lurline Hare, MD   PRIMARY CARE PHYSICIAN: Frazier Richards, MD   CHIEF COMPLAINT: Abdominal wall cellulitis.   HISTORY OF PRESENT ILLNESS: The patient is a 64 year old female with significant past medical history of insulin-dependent diabetes, hypertension, hyperlipidemia, obesity, presents with complaint for worsening lower abdominal wall cellulitis/wound and uncontrolled blood sugar. The patient reports she ran out of her Lantus and metformin for the last week. She did not take it for 10 days, reports she went to the pharmacy and picked it up on Friday night, but as well it was still missing the Lantus. As the patient reports, she is out of her glucose strips as well so she could not check her fingersticks at home. As well, the patient reports she is having lower abdominal wall erythema, tenderness, and a wound developed over the last four days. The patient reports the wound started initially on Thursday as a small boil, but it significantly got worse over the last 72 hours. The patient denies any fever, chest pain, shortness of breath, coffee-ground emesis, bright red blood per rectum or melena. The patient was found to have leukocytosis of 15,000. As well, urinalysis was positive for urinary tract infection. Upon admission, her fingersticks were 530, and she was given 12 units of NovoLog.   PAST MEDICAL HISTORY:  1. Sleep apnea, not on CPAP.  2. Allergies. 3. Diabetes mellitus with neuropathy/proteinuria.  4. Hyperlipidemia.  5. Chest pain, status post negative cardiac catheterization in 2007.  6. Pleurisy.  7. Jaundice, diagnosed with gallstone at that time.  8. Colon polyps.  9. Congestive heart failure.   PAST SURGICAL HISTORY:  1. Cholecystectomy in 1986.  2. Hysterectomy for uterine cancer in 2009.   HOME MEDICATIONS:  1. Aspirin 81 mg daily.  2. Coreg 3.125  mg oral b.i.d.  3. Cymbalta 30 mg daily.  4. Potassium chloride 10 mEq daily.  5. Lantus 60 units every time.  6. Metformin Extended Release 1000 mg oral p.o. b.i.d.  7. The patient was recently started by Endocrinology on NovoLog 70/30, 18 units b.i.d.  8. Ramipril 5 mg oral daily.  9. Simvastatin 40 mg oral daily.   ALLERGIES: Codeine is specified, even though the patient denies any allergy to codeine or Norco.   FAMILY HISTORY: Significant for breast cancer in the mother and significant for heart disease in her father.   SOCIAL HISTORY: The patient does not smoke, does not drink or use recreational drugs. She is a full-time Tax adviser.   REVIEW OF SYSTEMS: CONSTITUTIONAL: Denies any fever. Complains of fatigue and weakness. EYES: Denies blurry vision, double vision or pain. ENT: Denies tinnitus, ear pain, hearing loss. RESPIRATORY: Denies cough, wheezing, hemoptysis, dyspnea. CARDIOVASCULAR: Denies chest pain, orthopnea, edema, palpitations. GASTROINTESTINAL: Denies nausea, vomiting, diarrhea, abdominal pain, hematemesis, melena. GU: Denies dysuria, hematuria,  renal colic. ENDOCRINE: Denies polyuria, polydipsia, heat or cold intolerance. HEMATOLOGY: Denies anemia, easy bruising, bleeding diathesis. INTEGUMENTARY: Had abdominal wall erythema and wound, as well has groin area rash. MUSCULOSKELETAL: Denies any neck pain, shoulder pain, arthritis, swelling, or gout. NEUROLOGIC: Denies any dysarthria, epilepsy, tremors, or vertigo. Has diabetic polyneuropathy. PSYCHIATRIC: Denies any schizophrenia, anxiety, substance or alcohol abuse.   PHYSICAL EXAMINATION:  VITAL SIGNS: Temperature 98.2, pulse 95, respiratory rate 20, blood pressure 146/55, saturation 99% on room air.   GENERAL: Obese female who is comfortable in bed in no  apparent distress.   HEENT: Head atraumatic, normocephalic. Pupils are equal, reactive to light. Pink conjunctivae. Anicteric sclerae. Moist oral mucosa.   NECK:  Supple. No thyromegaly. No JVD.   CHEST: Good air entry bilaterally. No wheezing, rales, rhonchi.   CARDIOVASCULAR: S1, S2 heard. No rubs, murmur, or gallops.   ABDOMEN: Soft, nontender, nondistended. Bowel sounds present. Has right lower abdominal wall cellulitis with central area of blistering and peeled skin with some darkened areas.   EXTREMITIES: No edema. No clubbing. No cyanosis.   PSYCHIATRIC: Appropriate affect. Awake, alert x3. Intact judgment and insight.   NEUROLOGIC: Cranial nerves grossly intact. Motor five out of five without any significant deficits.   SKIN: Abdominal wall cellulitis and wound. As well, the patient had a fungal rash in the groin area.   PERTINENT LABORATORY, DIAGNOSTIC AND RADIOLOGICAL DATA: Glucose 525, BUN 17, creatinine 0.96, sodium 130, potassium 4.3, chloride 93, CO2 23, anion gap 14, total protein 7.3, total bilirubin 0.5, alkaline phosphatase 149, AST 13, ALT 18. White blood cells 15.2, hemoglobin 14.3, hematocrit 42.2, platelets 267. Urinalysis showing leukocyte esterase positive +2 and white blood cells 69.    ASSESSMENT AND PLAN:  1. Lower abdominal wall cellulitis with infected wound: Given the fact the patient is a diabetic with leukocytosis, we will get started on broad-spectrum IV antibiotic. She was initially on vancomycin and Zosyn. Blood cultures were sent in the ED. We will consult the Surgical Service for possible need of debridement of the wound.  2. Diabetes mellitus, uncontrolled: This is most likely secondary to medication noncompliance as the patient was out of medications for more than one week. The patient will be resumed on subcutaneous Lantus 45 units at bedtime and NovoLog sliding scale. As well, we will hold Metformin for the time being as the patient might be n.p.o. if any surgical intervention is needed, and we will adjust her insulin accordingly. As well, I will check glycohemoglobin A1c.  3. Skin candidiasis in the groin area:  The patient has significant skin fungal infection and will be started on p.o. Diflucan 100 mg oral daily as well as on a statin powder.  4. Urinary tract infection: The patient is on broad-spectrum IV antibiotics. We will follow urine culture.  5. Hypertension: We will continue home medication.  6. Hyperlipidemia: We will continue with statin.  7. Deep vein thrombosis prophylaxis: Subcutaneous heparin.  8. GI prophylaxis: We will continue on Protonix.   CODE STATUS: The patient is FULL CODE.   TOTAL TIME SPENT ON ADMISSION AND PATIENT CARE: 60 minutes.  ____________________________ Albertine Patricia, MD dse:cbb D: 05/20/2012 04:45:46 ET T: 05/20/2012 08:04:15 ET JOB#: [id number]  cc: Albertine Patricia, MD, <Dictator> Ocie Cornfield. Ouida Sills, MD Tegh Franek Graciela Husbands MD ELECTRONICALLY SIGNED 05/21/2012 1:40

## 2014-12-15 NOTE — Consult Note (Signed)
PATIENT NAME:  Judith Shaw, Judith Shaw MR#:  [id number] DATE OF BIRTH:  1951-08-07  DATE OF CONSULTATION:  05/20/2012  REFERRING PHYSICIAN:   CONSULTING PHYSICIAN:  Micheline Maze, MD  PRIMARY CARE PHYSICIAN: Frazier Richards, MD    BRIEF HISTORY: Judith Shaw is a pleasant 64 year old woman with a longstanding history of morbid obesity and diabetes who presents with abdominal wall cellulitis and uncontrolled blood sugar. She did not take her medications for diabetes for approximately a week to 10 days. She developed abdominal wall erythema, tenderness, and a large abscess. The area actually started five days ago but she presented to the Emergency Room last night with an elevated white blood cell count of 15,000. Physical evaluation demonstrated a large abdominal wall abscess with a necrotic eschar over the surface. The surgical service was consulted.   PAST MEDICAL HISTORY:  1. Congestive heart failure, well controlled. 2. Chest pain with a negative catheterization in 2007. 3. Longstanding diabetes with neuropathy. 4. Sleep apnea, currently not taking any supportive CPAP. 5. Hyperlipidemia. 6. History of colonic polyps.   PAST SURGICAL HISTORY:  1. Hysterectomy. 2. Cholecystectomy.   CURRENT MEDICATIONS: Outlined on her admission note.   ALLERGIES: She is allergic to codeine.   FAMILY HISTORY: Unremarkable with regard to current problem.   SOCIAL HISTORY: As outlined in the medical note. The patient does not smoke or drink. She is a Education officer, museum.   REVIEW OF SYSTEMS: Otherwise unremarkable.   PHYSICAL EXAMINATION:   GENERAL: She is an alert pleasant woman in no significant distress. She is lying in bed comfortably with no complaints.   VITALS: Blood pressure 158/74, heart rate 68 and regular.   HEENT: Normal sclerae with normal pupillary reaction. No facial deformities.   NECK: Supple without adenopathy. Trachea is midline.   CHEST: Clear with no adventitious sounds. Normal pulmonary  excursion.   CARDIAC: No murmurs or gallops. She seems to be in normal sinus rhythm.   ABDOMEN: Soft, nontender with no organomegaly. She has a large abdominal wall abscess in the right side with an eschar over the surface. There is some area of spontaneous drainage. Abscess is the size of a baseball.   EXTREMITIES: Lower extremity exam is unremarkable with no edema and normal distal pulses. She has full range of motion.   PSYCHIATRIC: Unremarkable with normal affect and normal orientation.   IMPRESSION: This woman has a large abdominal wall abscess likely related to her obesity and her diabetes. She's already had lunch today at the time I saw her so we will set her up for elective incision and drainage tomorrow morning. This plan has been outlined to the patient in detail and she is in agreement.   ____________________________ Micheline Maze, MD rle:drc D: 05/20/2012 18:14:09 ET T: 05/21/2012 09:31:44 ET JOB#: [id number]  cc: Micheline Maze, MD, <Dictator> Ocie Cornfield. Ouida Sills, MD Rodena Goldmann MD ELECTRONICALLY SIGNED 05/22/2012 18:25

## 2015-10-05 DIAGNOSIS — Z0000 Encounter for general adult medical examination without abnormal findings: Secondary | ICD-10-CM | POA: Insufficient documentation

## 2016-09-06 IMAGING — US US EXTREM LOW VENOUS*L*
1 series · 14 of 24 positions shown · non-contrast
Comparison: None.

CLINICAL DATA: Left leg swelling for 6 weeks

EXAM:
LEFT LOWER EXTREMITY VENOUS DUPLEX ULTRASOUND
TECHNIQUE: Doppler venous assessment of the left lower extremity deep venous
system was performed, including characterization of spectral flow,
compressibility, and phasicity.

[Series 1: us extrem low venous*left* · 0.11mm/px · 14 of 32 slices shown]
[im 1/32]
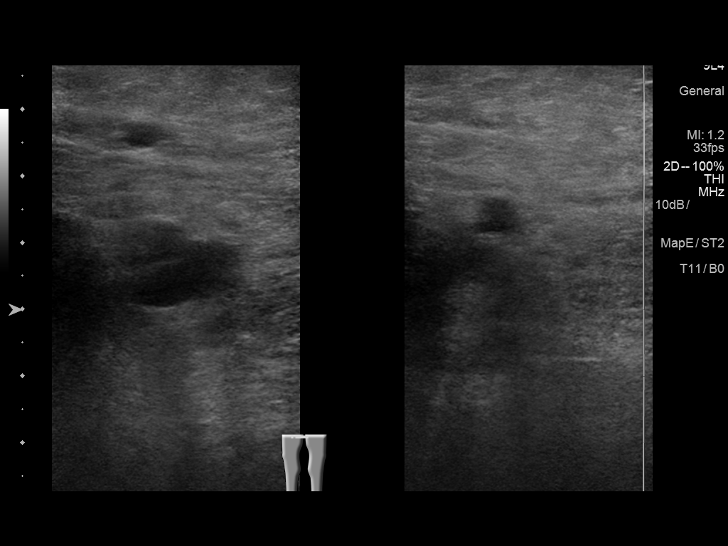
[im 3/32]
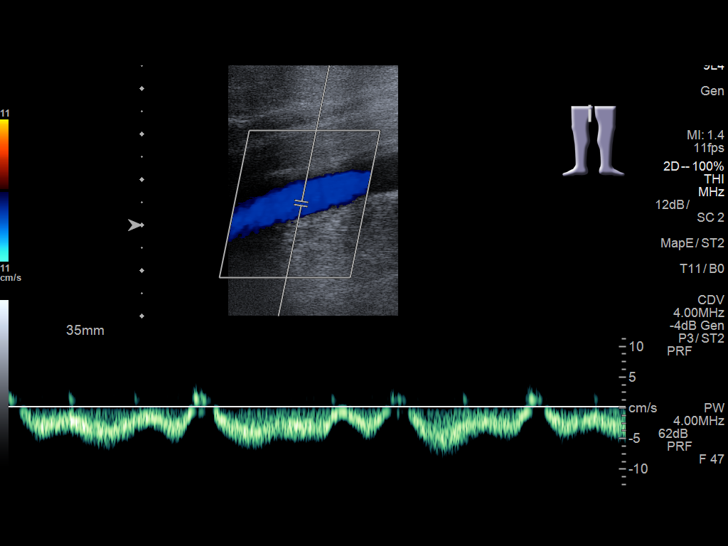
[im 6/32]
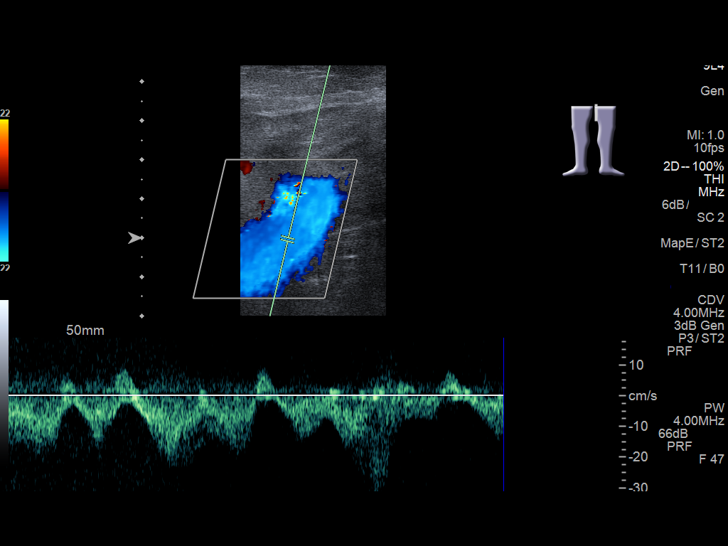
[im 9/32]
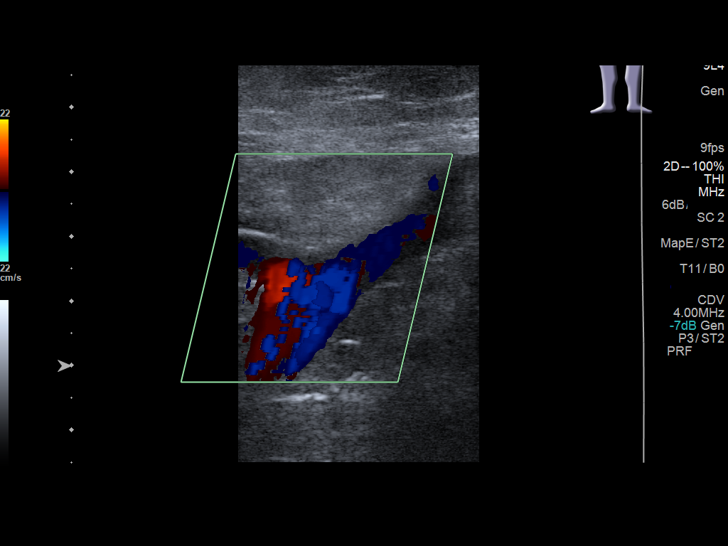
[im 10/32]
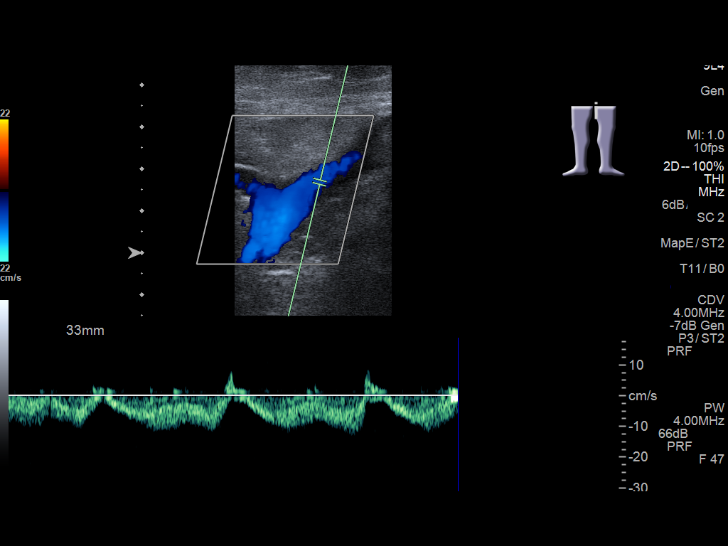
[im 13/32]
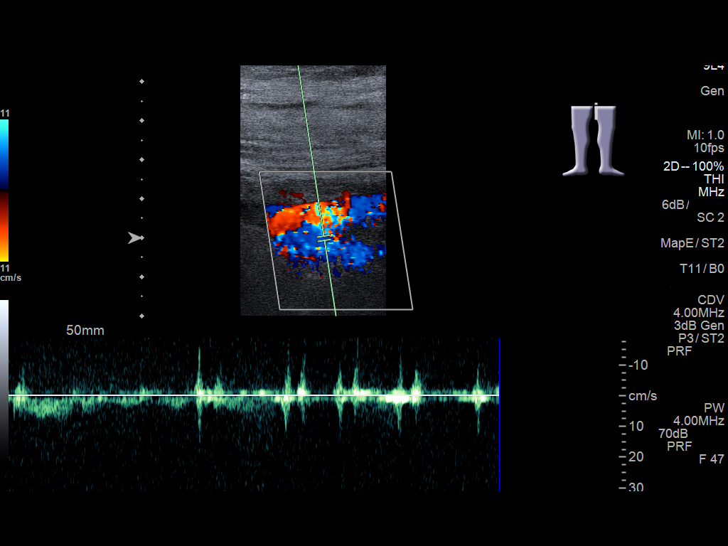
[im 15/32]
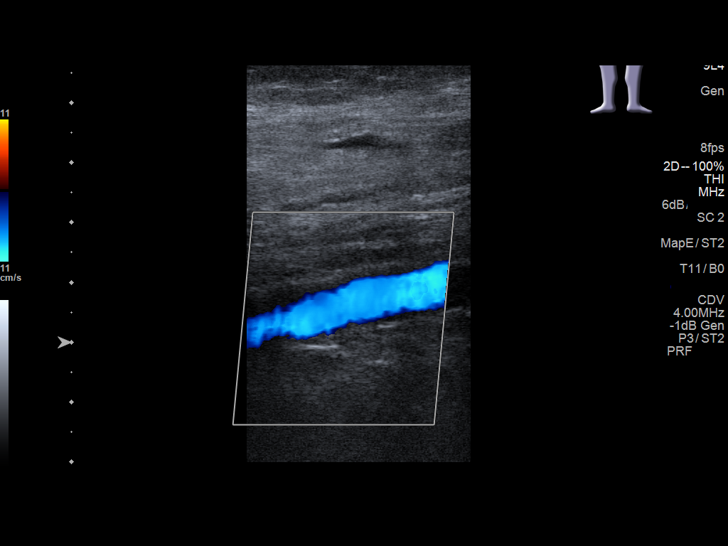
[im 17/32]
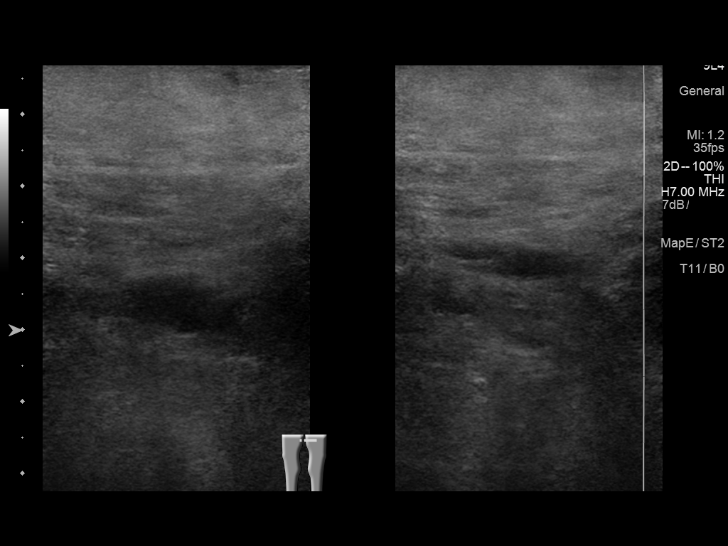
[im 19/32]
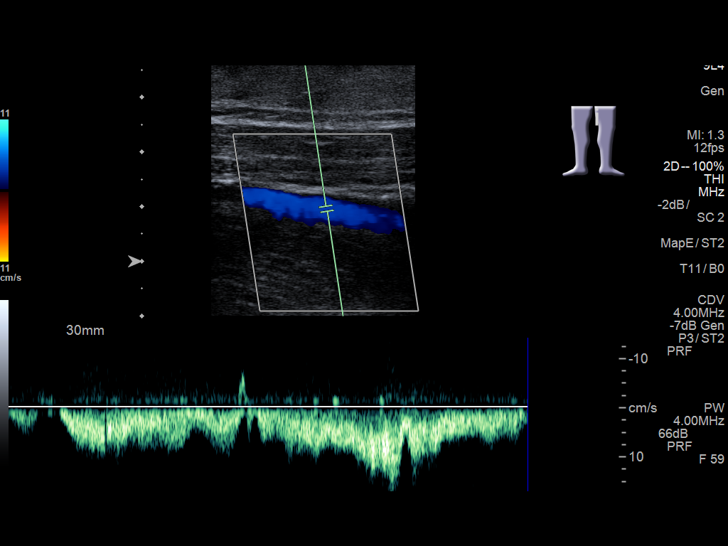
[im 22/32]
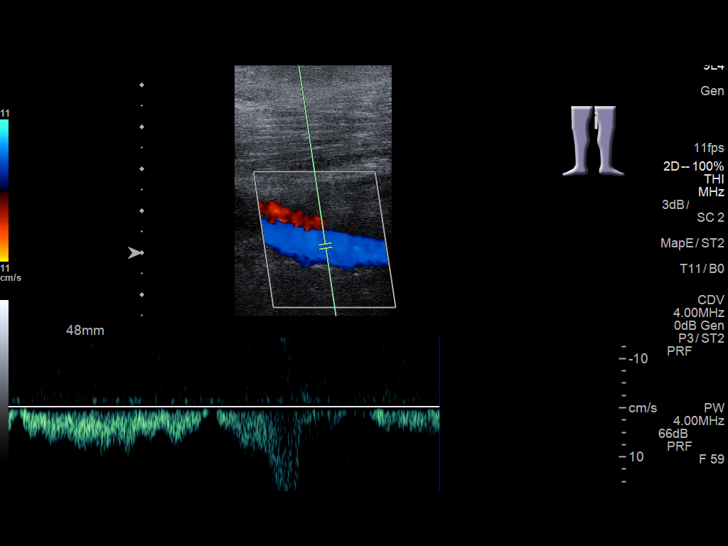
[im 25/32]
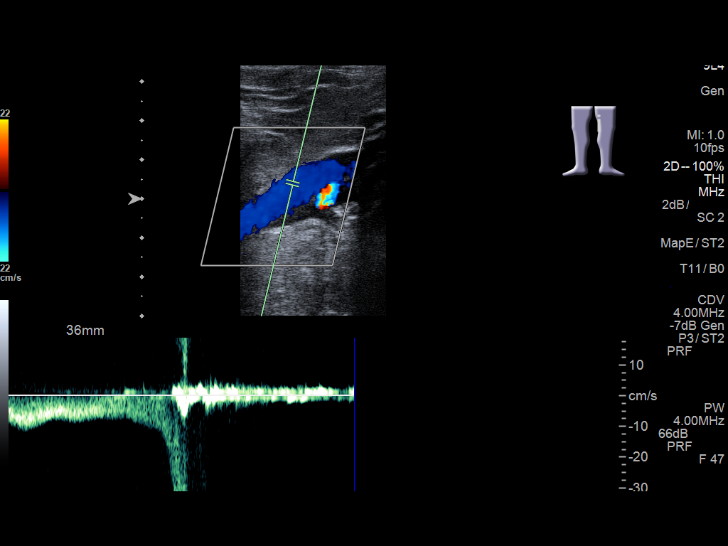
[im 26/32]
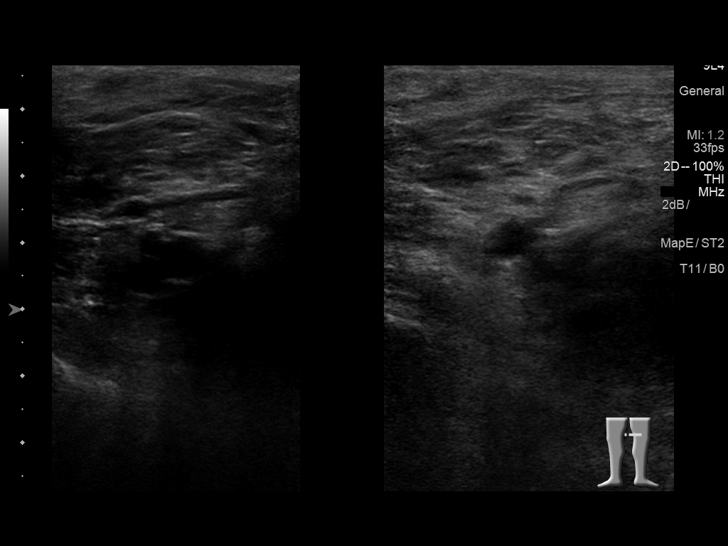
[im 29/32]
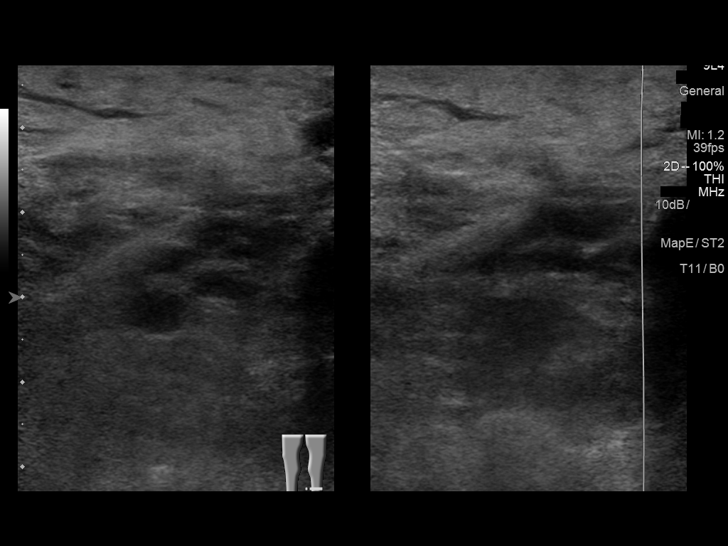
[im 32/32]
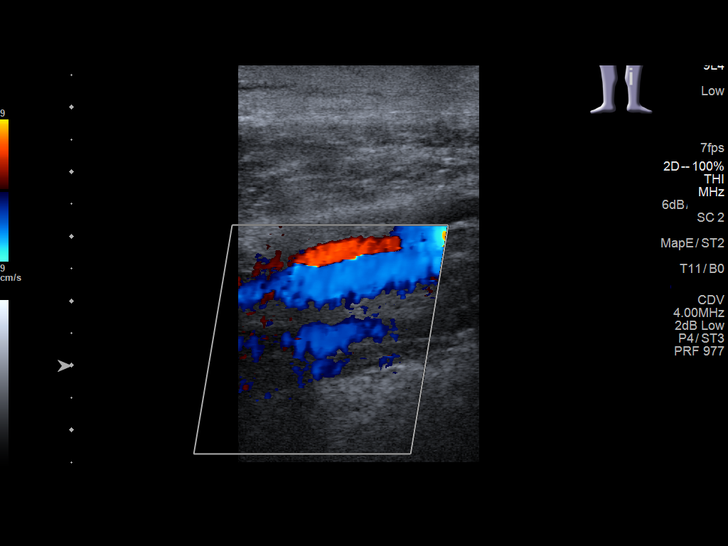

[14 of 24 positions shown; findings below may reference images not displayed]

FINDINGS: There is complete compressibility of the left common femoral,
femoral, and popliteal veins. Doppler analysis demonstrates
respiratory phasicity and augmentation of flow with calf
compression. No obvious superficial vein or calf vein thrombosis.
IMPRESSION: No evidence of left lower extremity DVT.

## 2016-09-21 ENCOUNTER — Emergency Department: Payer: Medicare Other

## 2016-09-21 ENCOUNTER — Inpatient Hospital Stay
Admission: EM | Admit: 2016-09-21 | Discharge: 2016-09-25 | DRG: 871 | Disposition: A | Discharge status: Home / Self Care | Payer: Medicare Other | Attending: Internal Medicine | Admitting: Internal Medicine

## 2016-09-21 ENCOUNTER — Encounter: Payer: Self-pay | Admitting: Emergency Medicine

## 2016-09-21 DIAGNOSIS — E785 Hyperlipidemia, unspecified: Secondary | ICD-10-CM | POA: Diagnosis present

## 2016-09-21 DIAGNOSIS — Z8249 Family history of ischemic heart disease and other diseases of the circulatory system: Secondary | ICD-10-CM

## 2016-09-21 DIAGNOSIS — F32A Depression, unspecified: Secondary | ICD-10-CM | POA: Diagnosis present

## 2016-09-21 DIAGNOSIS — J449 Chronic obstructive pulmonary disease, unspecified: Secondary | ICD-10-CM | POA: Diagnosis present

## 2016-09-21 DIAGNOSIS — G9341 Metabolic encephalopathy: Secondary | ICD-10-CM | POA: Diagnosis present

## 2016-09-21 DIAGNOSIS — B961 Klebsiella pneumoniae [K. pneumoniae] as the cause of diseases classified elsewhere: Secondary | ICD-10-CM | POA: Diagnosis present

## 2016-09-21 DIAGNOSIS — Z794 Long term (current) use of insulin: Secondary | ICD-10-CM

## 2016-09-21 DIAGNOSIS — N3000 Acute cystitis without hematuria: Secondary | ICD-10-CM

## 2016-09-21 DIAGNOSIS — I5032 Chronic diastolic (congestive) heart failure: Secondary | ICD-10-CM | POA: Diagnosis present

## 2016-09-21 DIAGNOSIS — K5900 Constipation, unspecified: Secondary | ICD-10-CM | POA: Diagnosis present

## 2016-09-21 DIAGNOSIS — E669 Obesity, unspecified: Secondary | ICD-10-CM | POA: Diagnosis present

## 2016-09-21 DIAGNOSIS — R4182 Altered mental status, unspecified: Secondary | ICD-10-CM | POA: Diagnosis present

## 2016-09-21 DIAGNOSIS — I10 Essential (primary) hypertension: Secondary | ICD-10-CM | POA: Diagnosis present

## 2016-09-21 DIAGNOSIS — F329 Major depressive disorder, single episode, unspecified: Secondary | ICD-10-CM | POA: Diagnosis present

## 2016-09-21 DIAGNOSIS — A419 Sepsis, unspecified organism: Principal | ICD-10-CM | POA: Diagnosis present

## 2016-09-21 DIAGNOSIS — L03116 Cellulitis of left lower limb: Secondary | ICD-10-CM | POA: Diagnosis present

## 2016-09-21 DIAGNOSIS — E119 Type 2 diabetes mellitus without complications: Secondary | ICD-10-CM | POA: Diagnosis present

## 2016-09-21 DIAGNOSIS — Z6841 Body Mass Index (BMI) 40.0 and over, adult: Secondary | ICD-10-CM | POA: Diagnosis not present

## 2016-09-21 DIAGNOSIS — G4730 Sleep apnea, unspecified: Secondary | ICD-10-CM | POA: Diagnosis present

## 2016-09-21 DIAGNOSIS — Z7982 Long term (current) use of aspirin: Secondary | ICD-10-CM

## 2016-09-21 DIAGNOSIS — N390 Urinary tract infection, site not specified: Secondary | ICD-10-CM | POA: Diagnosis present

## 2016-09-21 DIAGNOSIS — B9689 Other specified bacterial agents as the cause of diseases classified elsewhere: Secondary | ICD-10-CM | POA: Diagnosis present

## 2016-09-21 DIAGNOSIS — R32 Unspecified urinary incontinence: Secondary | ICD-10-CM | POA: Diagnosis present

## 2016-09-21 DIAGNOSIS — I110 Hypertensive heart disease with heart failure: Secondary | ICD-10-CM | POA: Diagnosis present

## 2016-09-21 DIAGNOSIS — Z79899 Other long term (current) drug therapy: Secondary | ICD-10-CM

## 2016-09-21 DIAGNOSIS — M7989 Other specified soft tissue disorders: Secondary | ICD-10-CM

## 2016-09-21 HISTORY — DX: Essential (primary) hypertension: I10

## 2016-09-21 HISTORY — DX: Hyperlipidemia, unspecified: E78.5

## 2016-09-21 HISTORY — DX: Depression, unspecified: F32.A

## 2016-09-21 HISTORY — DX: Heart failure, unspecified: I50.9

## 2016-09-21 HISTORY — DX: Major depressive disorder, single episode, unspecified: F32.9

## 2016-09-21 HISTORY — DX: Type 2 diabetes mellitus without complications: E11.9

## 2016-09-21 LAB — INFLUENZA PANEL BY PCR (TYPE A & B)
INFLAPCR: NEGATIVE
INFLBPCR: NEGATIVE

## 2016-09-21 LAB — URINALYSIS, COMPLETE (UACMP) WITH MICROSCOPIC
BILIRUBIN URINE: NEGATIVE
Glucose, UA: 50 mg/dL — AB
Ketones, ur: 5 mg/dL — AB
Nitrite: NEGATIVE
Protein, ur: 100 mg/dL — AB
SPECIFIC GRAVITY, URINE: 1.016 (ref 1.005–1.030)
pH: 5 (ref 5.0–8.0)

## 2016-09-21 LAB — CBC WITH DIFFERENTIAL/PLATELET
BASOS PCT: 0 %
Basophils Absolute: 0.1 10*3/uL (ref 0–0.1)
Eosinophils Absolute: 0 10*3/uL (ref 0–0.7)
Eosinophils Relative: 0 %
HCT: 41.8 % (ref 35.0–47.0)
Hemoglobin: 14.3 g/dL (ref 12.0–16.0)
LYMPHS PCT: 3 %
Lymphs Abs: 0.6 10*3/uL — ABNORMAL LOW (ref 1.0–3.6)
MCH: 30.2 pg (ref 26.0–34.0)
MCHC: 34.1 g/dL (ref 32.0–36.0)
MCV: 88.3 fL (ref 80.0–100.0)
MONO ABS: 1.1 10*3/uL — AB (ref 0.2–0.9)
MONOS PCT: 5 %
NEUTROS ABS: 20.5 10*3/uL — AB (ref 1.4–6.5)
Neutrophils Relative %: 92 %
Platelets: 247 10*3/uL (ref 150–440)
RBC: 4.73 MIL/uL (ref 3.80–5.20)
RDW: 13.8 % (ref 11.5–14.5)
WBC: 22.2 10*3/uL — ABNORMAL HIGH (ref 3.6–11.0)

## 2016-09-21 LAB — COMPREHENSIVE METABOLIC PANEL
ALT: 19 U/L (ref 14–54)
AST: 26 U/L (ref 15–41)
Albumin: 3.4 g/dL — ABNORMAL LOW (ref 3.5–5.0)
Alkaline Phosphatase: 80 U/L (ref 38–126)
Anion gap: 11 (ref 5–15)
BILIRUBIN TOTAL: 0.8 mg/dL (ref 0.3–1.2)
BUN: 18 mg/dL (ref 6–20)
CO2: 21 mmol/L — ABNORMAL LOW (ref 22–32)
Calcium: 9 mg/dL (ref 8.9–10.3)
Chloride: 100 mmol/L — ABNORMAL LOW (ref 101–111)
Creatinine, Ser: 1.11 mg/dL — ABNORMAL HIGH (ref 0.44–1.00)
GFR calc Af Amer: 59 mL/min — ABNORMAL LOW (ref >60)
GFR, EST NON AFRICAN AMERICAN: 51 mL/min — AB (ref >60)
Glucose, Bld: 263 mg/dL — ABNORMAL HIGH (ref 65–99)
POTASSIUM: 4.4 mmol/L (ref 3.5–5.1)
Sodium: 132 mmol/L — ABNORMAL LOW (ref 135–145)
TOTAL PROTEIN: 7.8 g/dL (ref 6.5–8.1)

## 2016-09-21 LAB — ACETAMINOPHEN LEVEL

## 2016-09-21 LAB — SALICYLATE LEVEL

## 2016-09-21 LAB — ETHANOL

## 2016-09-21 LAB — TROPONIN I: TROPONIN I: 0.04 ng/mL — AB (ref <0.03)

## 2016-09-21 IMAGING — CR DG CHEST 1V
1 series · 1 of 1 positions shown · non-contrast
Comparison: [DATE] CT of the chest.

CLINICAL DATA: 65 y/o F; altered mental status, weakness, possible
overdose.

EXAM:
CHEST 1 VIEW

[x chest ap]
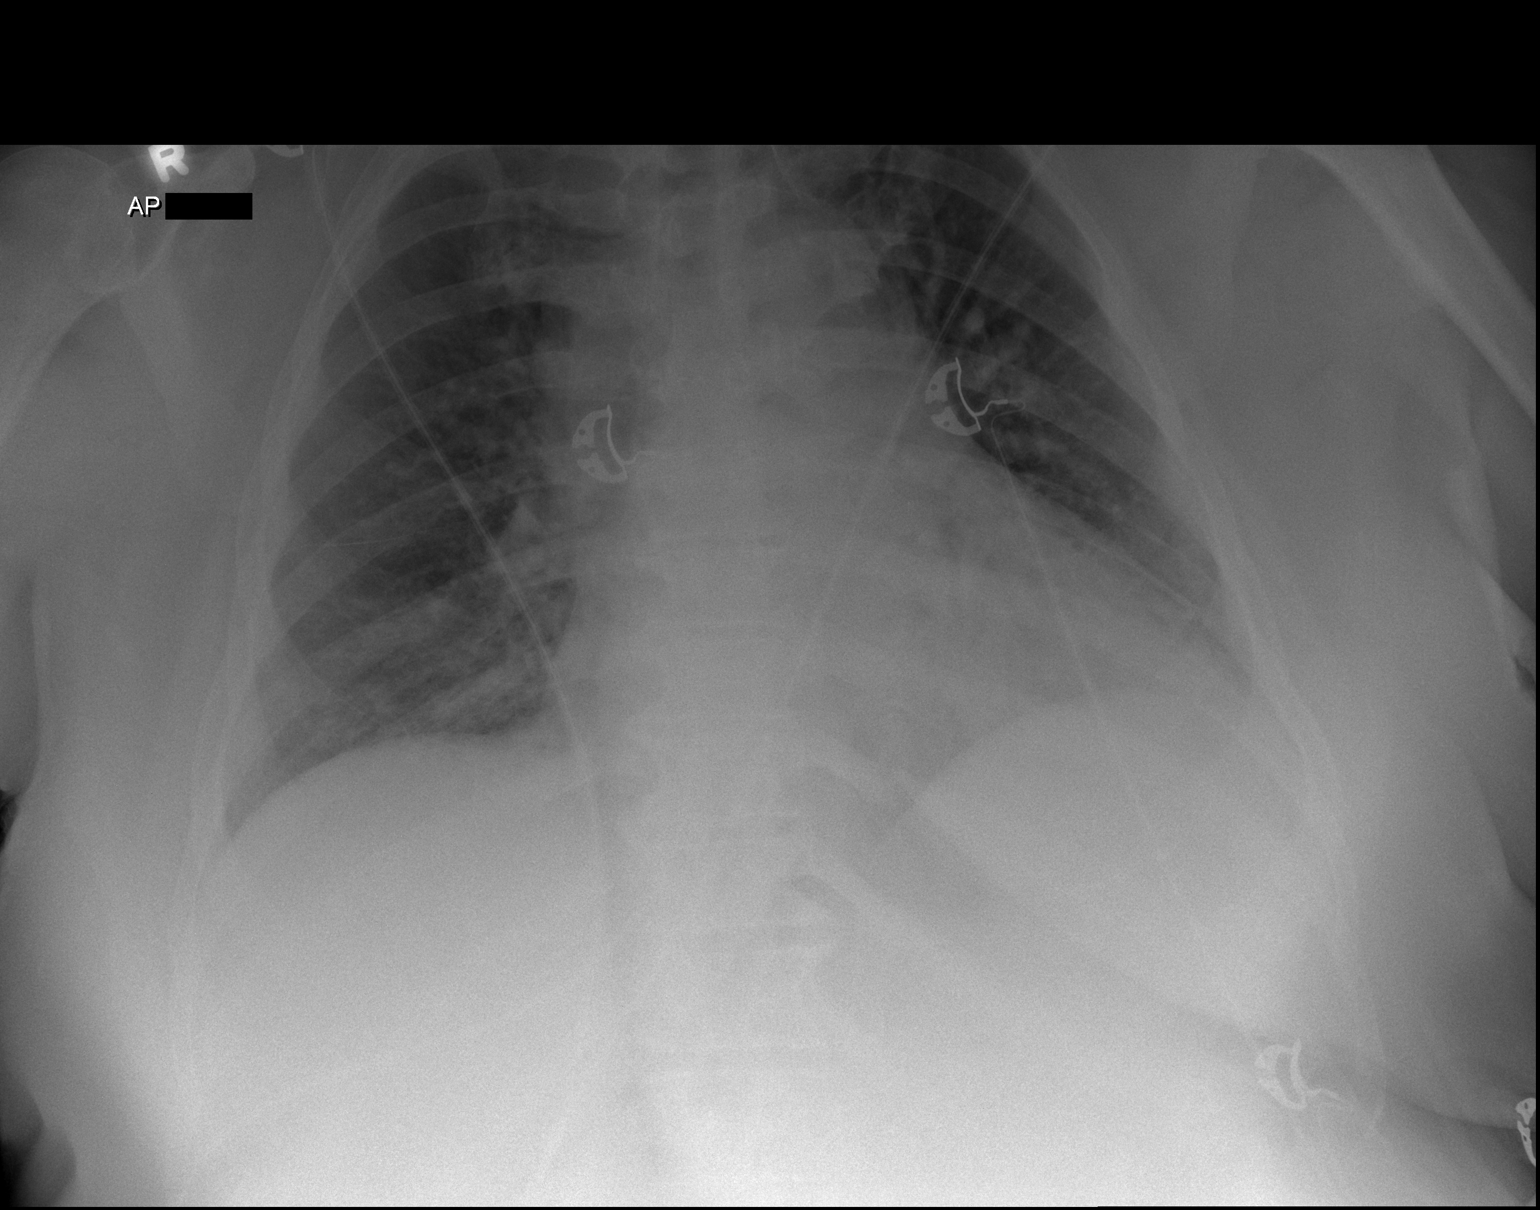

[1 of 1 positions shown; findings below may reference images not displayed]

FINDINGS: The heart size and mediastinal contours are within normal limits and
stable given projection and technique. Both lungs are clear. The
visualized skeletal structures are unremarkable.
IMPRESSION: No active disease.

By: HUMBERTH M.D.

## 2016-09-21 IMAGING — CT CT CHEST W/ CM
2 of 3 series · 15 of 36 positions shown, 18 images · IV contrast (iopamidol)
Comparison: Chest radiograph performed earlier today at [DATE] p.m.,
and CTA of the chest performed [DATE]

CLINICAL DATA: Acute onset of cough, fever and night sweats.
Shortness of breath. Initial encounter.

EXAM:
CT CHEST WITH CONTRAST
TECHNIQUE: Multidetector CT imaging of the chest was performed during
intravenous contrast administration.
CONTRAST:  75mL [LA] IOPAMIDOL ([LA]) INJECTION 61%

[Series 2: axial st · axial · 0.91mm/px · z∈[-775,-493]mm · 12 of 167 slices shown, 15 images]
[im 13/167  mediastinal]
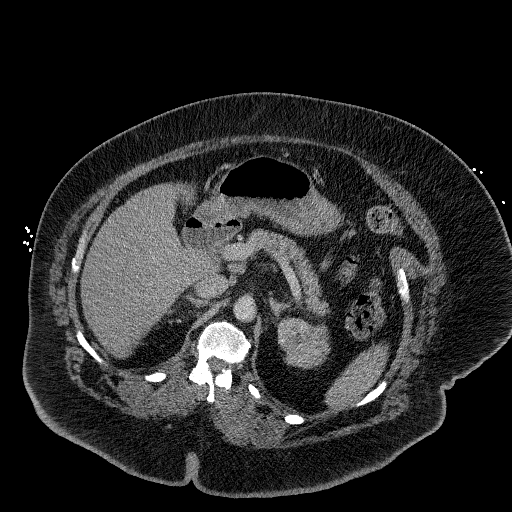
[im 13/167  lung]
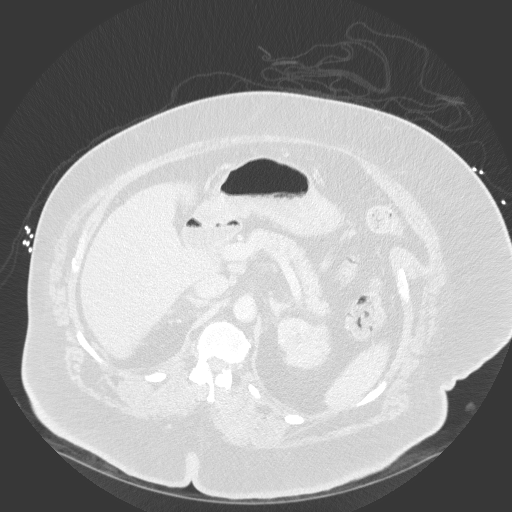
[im 25/167  lung]
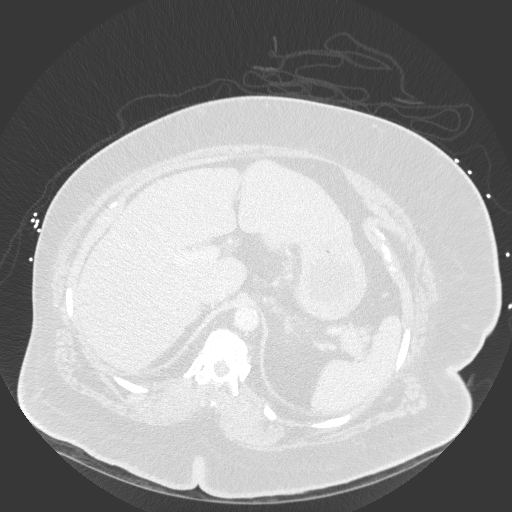
[im 37/167  lung]
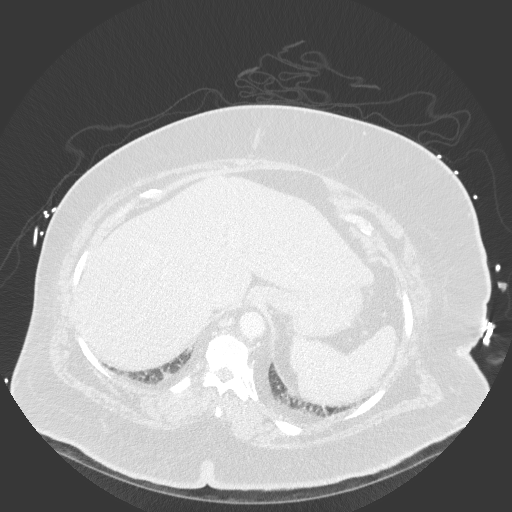
[im 50/167  lung]
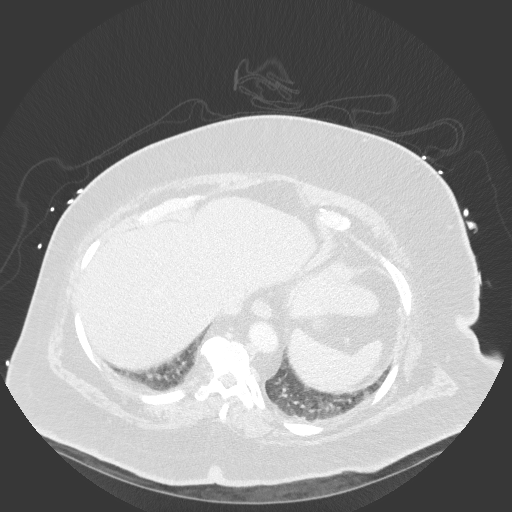
[im 62/167  mediastinal]
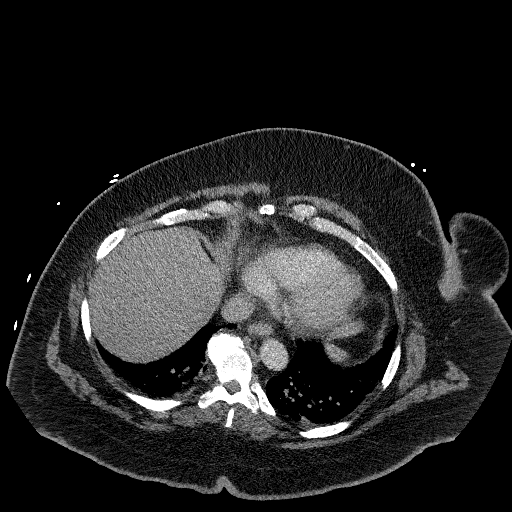
[im 62/167  lung]
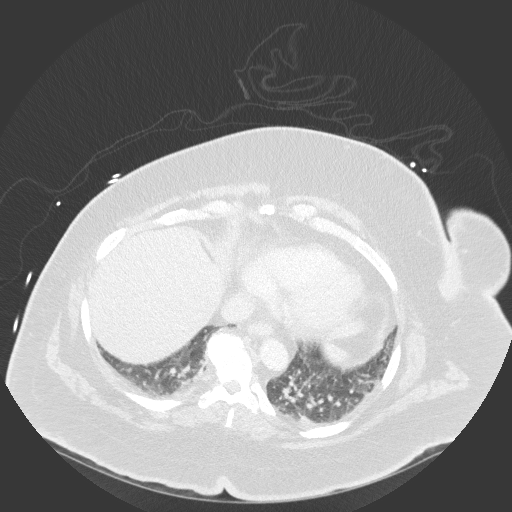
[im 74/167  lung]
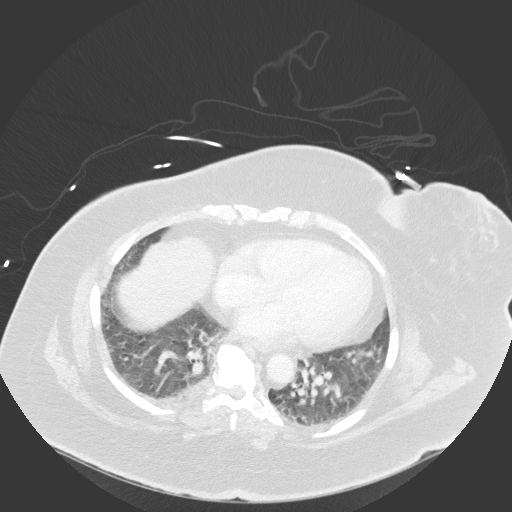
[im 93/167  lung]
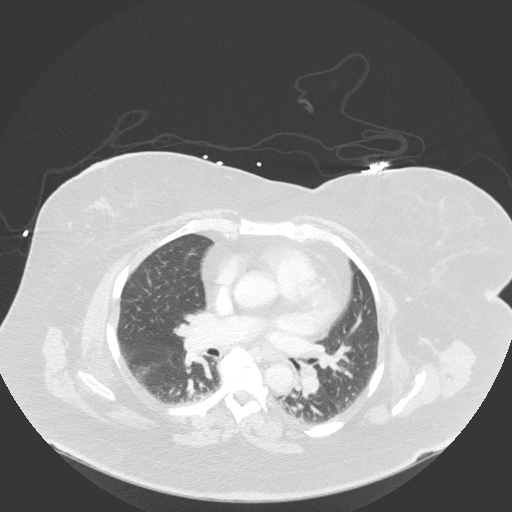
[im 105/167  lung]
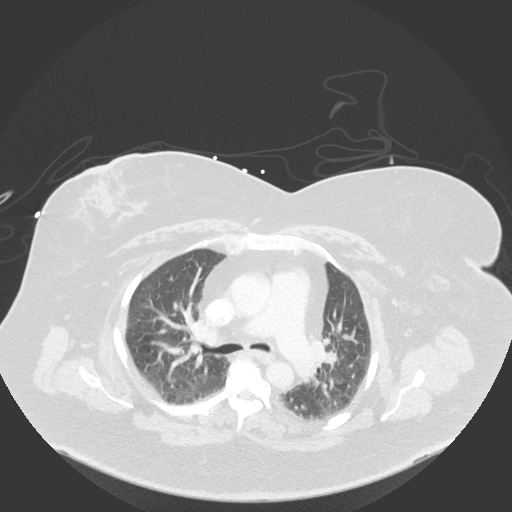
[im 117/167  mediastinal]
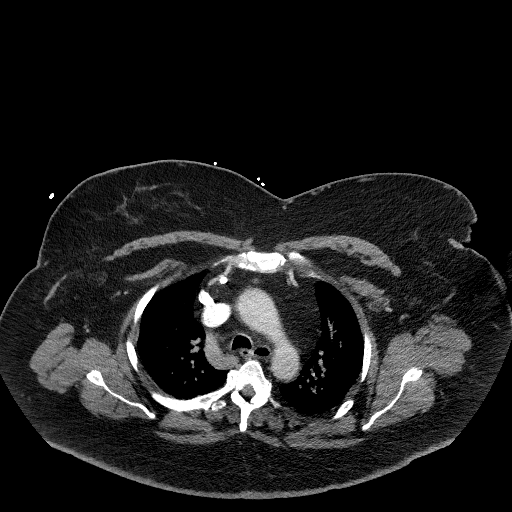
[im 117/167  lung]
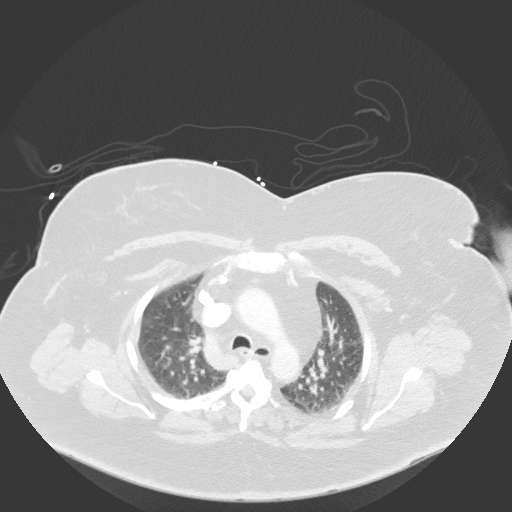
[im 130/167  lung]
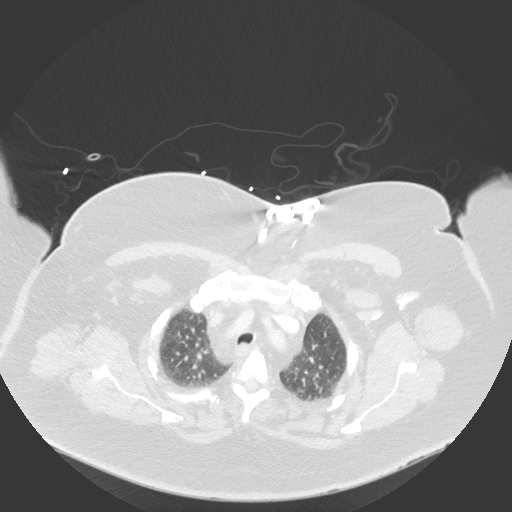
[im 142/167  lung]
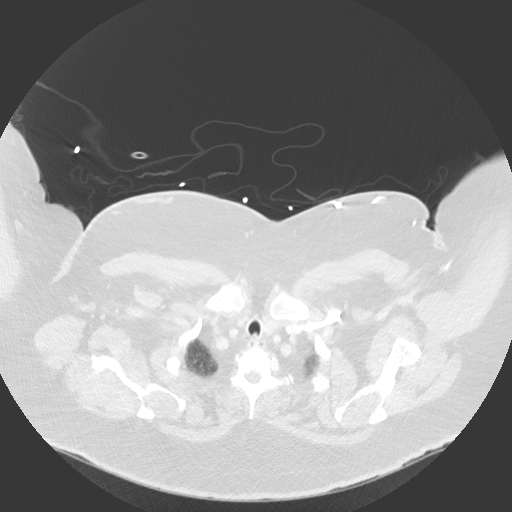
[im 154/167  lung]
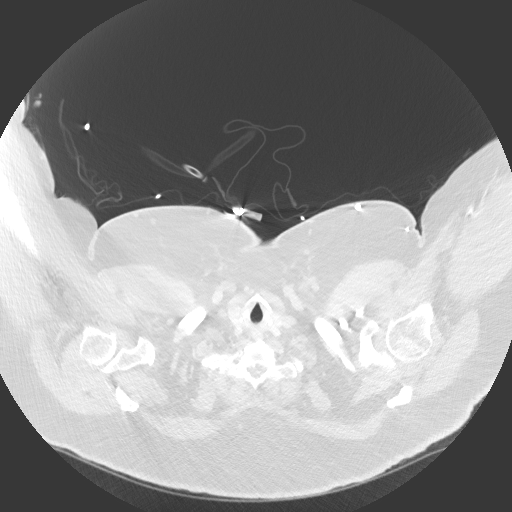

[Series 5: coronal · coronal · 0.68mm/px · 3 of 121 slices shown]
[im 25/121  lung]
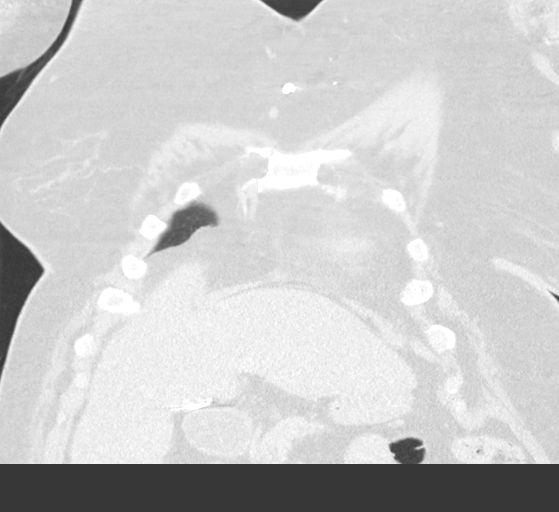
[im 49/121  lung]
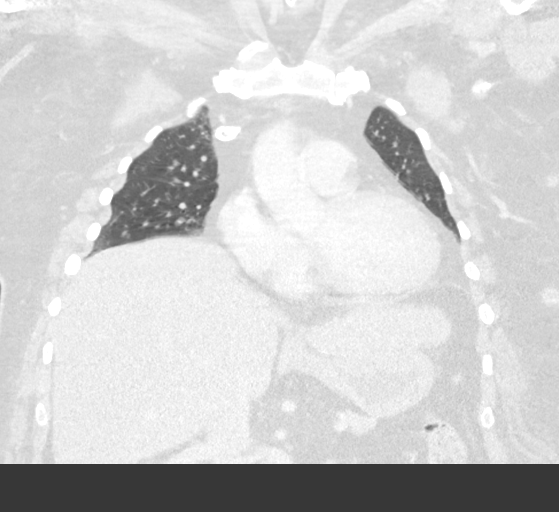
[im 73/121  lung]
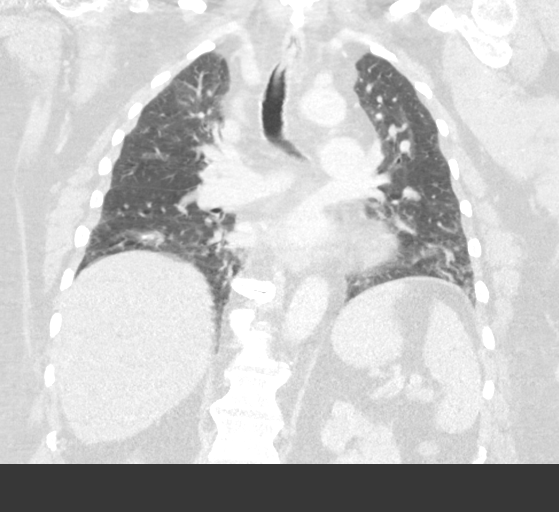

[15 of 36 positions shown; findings below may reference images not displayed]

FINDINGS: Cardiovascular: The heart is borderline enlarged. The thoracic aorta
is unremarkable. The great vessels are unremarkable in appearance.
No calcific atherosclerotic disease is seen.

Mediastinum/Nodes: The mediastinum is unremarkable in appearance. No
mediastinal lymphadenopathy is seen. No pericardial effusion is
identified. The thyroid gland is unremarkable. No axillary
lymphadenopathy is seen.

Lungs/Pleura: Mild patchy bibasilar airspace opacities may reflect
atelectasis or possibly mild interstitial edema. The lungs are
otherwise clear. No pleural effusion or pneumothorax is seen. No
masses are identified.

Upper Abdomen: The visualized portions of the liver and spleen are
grossly unremarkable. The patient is status post cholecystectomy,
with clips noted at the gallbladder fossa. The visualized portions
of the pancreas, adrenal glands and kidneys are unremarkable, aside
from mild left renal scarring.

Musculoskeletal: No acute osseous abnormalities are identified. The
visualized musculature is unremarkable in appearance.
IMPRESSION: 1. Mild patchy bibasilar airspace opacities may reflect atelectasis
or possibly mild interstitial edema.
2. Borderline cardiomegaly.
3. Mild left renal scarring.

## 2016-09-21 IMAGING — CT CT HEAD W/O CM
3 series · 15 of 46 positions shown, 18 images · non-contrast
Comparison: CT of the head performed [DATE]

CLINICAL DATA: Acute onset of altered mental status. Initial
encounter.

EXAM:
CT HEAD WITHOUT CONTRAST
TECHNIQUE: Contiguous axial images were obtained from the base of the skull
through the vertex without intravenous contrast.

[Series 3: ax head wo · axial · 0.37mm/px · z∈[-25,+85]mm · 9 of 29 slices shown, 12 images]
[im 3/29  brain]
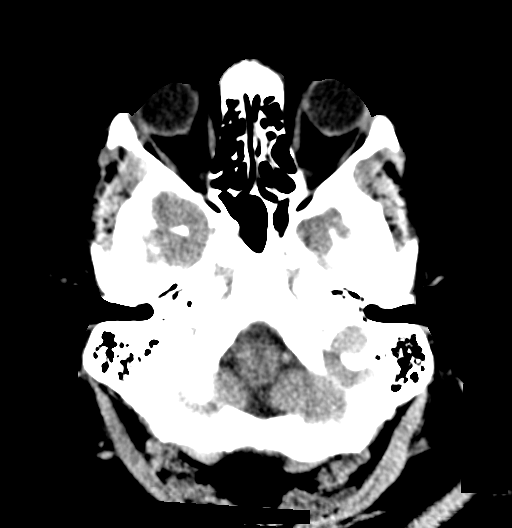
[im 3/29  bone]
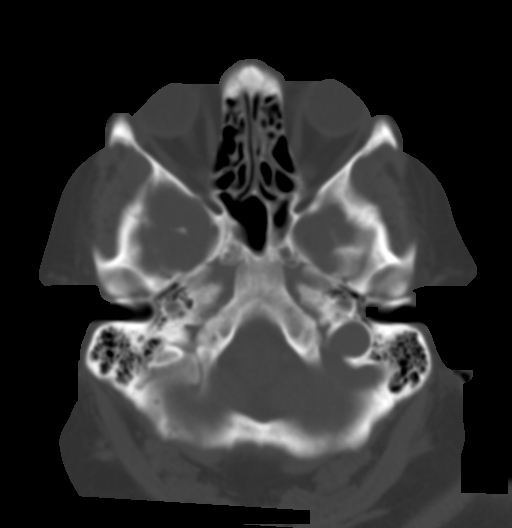
[im 6/29  brain]
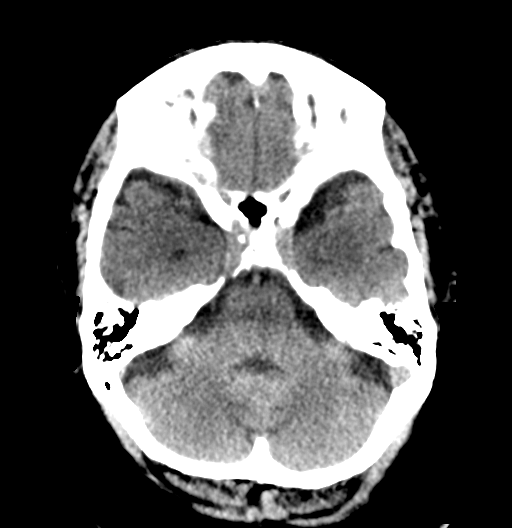
[im 9/29  brain]
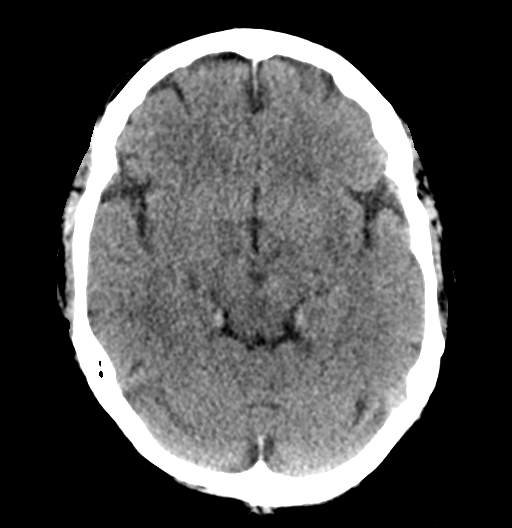
[im 12/29  brain]
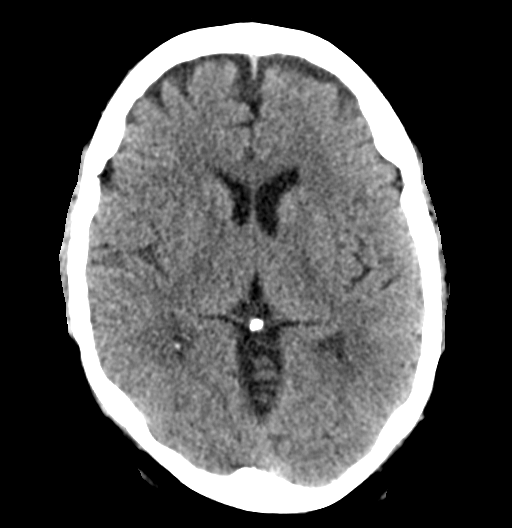
[im 15/29  brain]
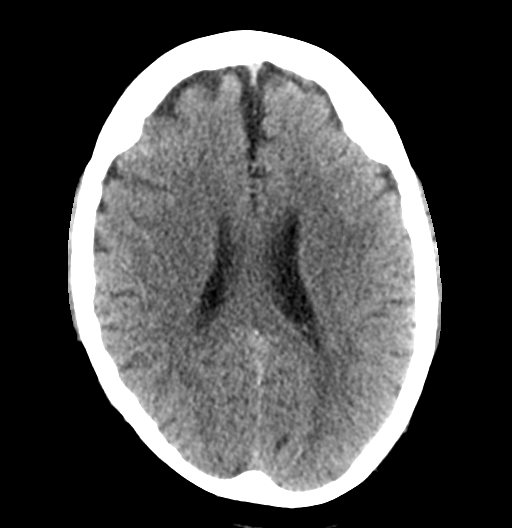
[im 15/29  bone]
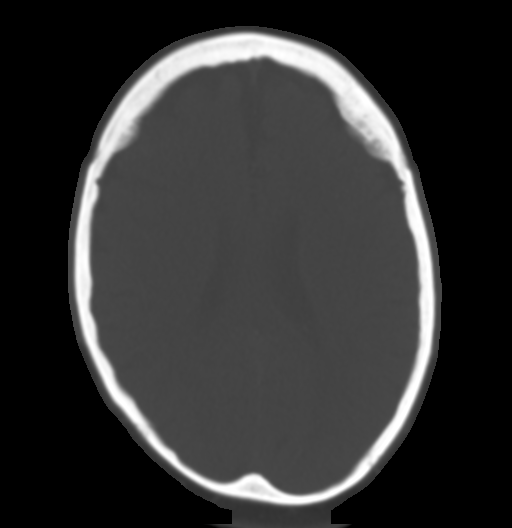
[im 18/29  brain]
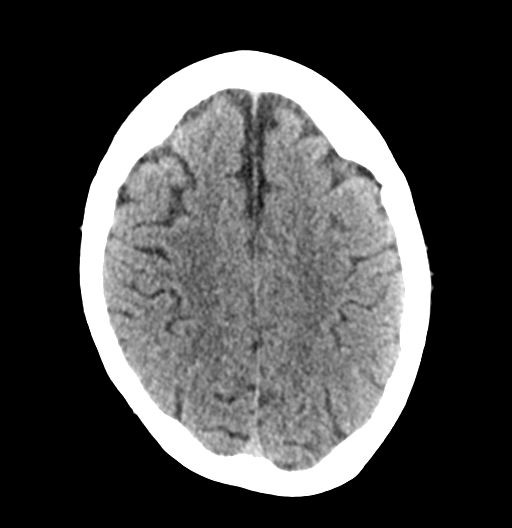
[im 21/29  brain]
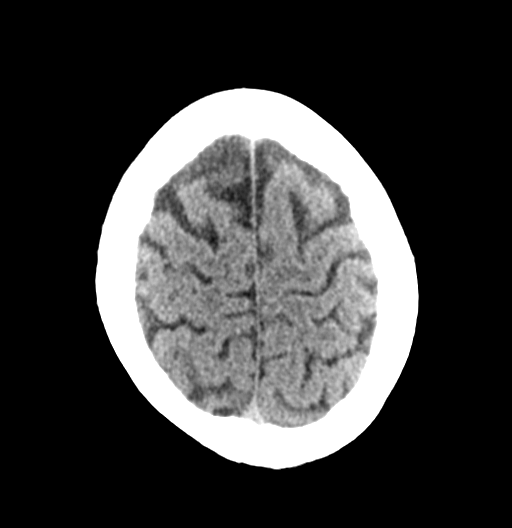
[im 24/29  brain]
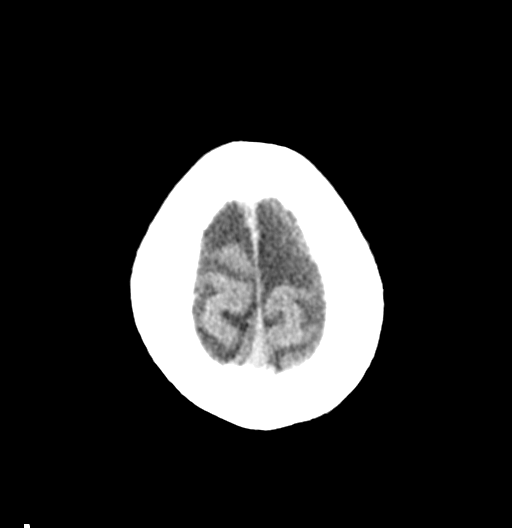
[im 27/29  brain]
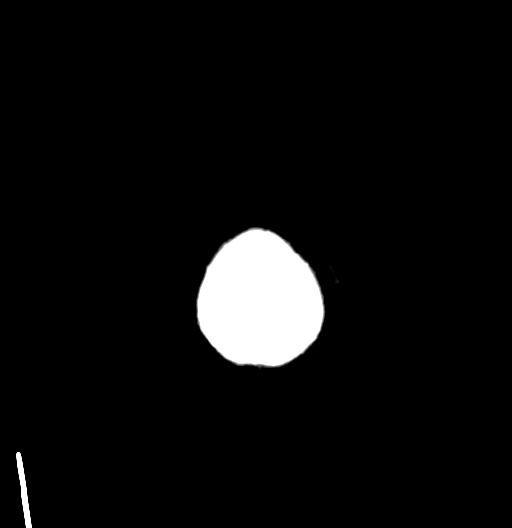
[im 27/29  bone]
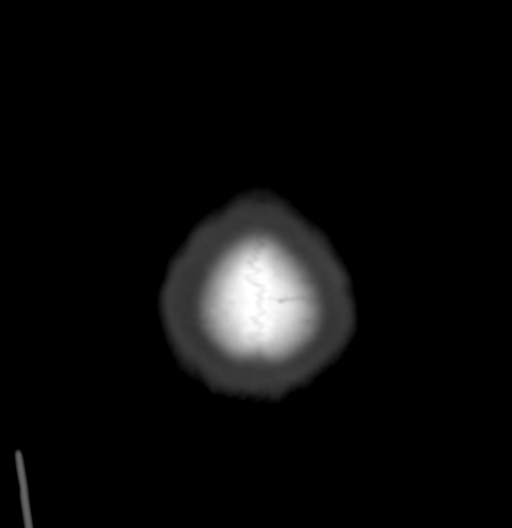

[Series 5: coronal soft tissue · coronal · 0.32mm/px · 3 of 63 slices shown]
[im 21/63  brain]
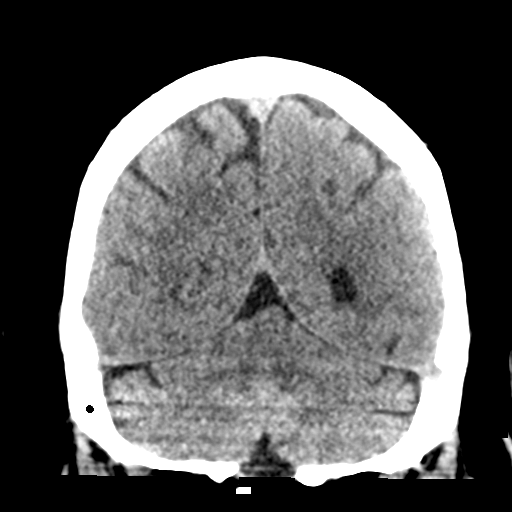
[im 28/63  brain]
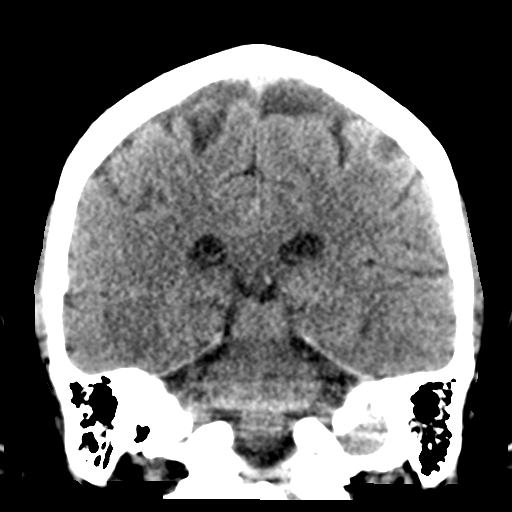
[im 35/63  brain]
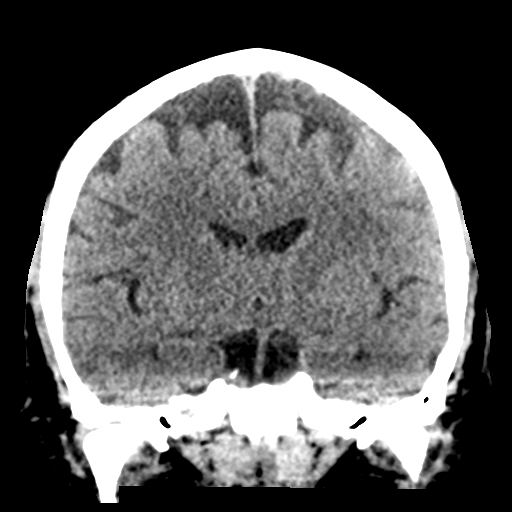

[Series 6: sagittal soft tissue · sagittal · 0.29mm/px · 3 of 53 slices shown]
[im 18/53  brain]
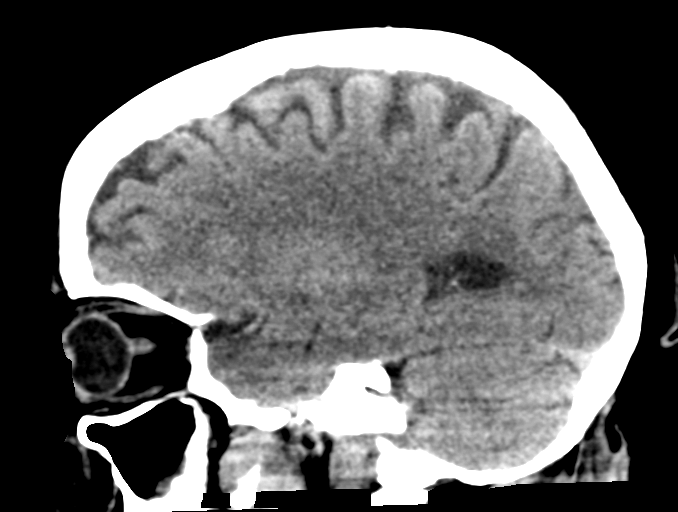
[im 27/53  brain]
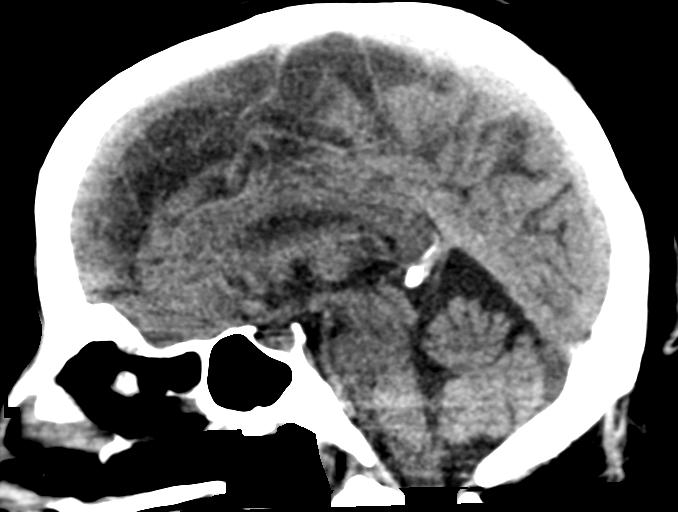
[im 35/53  brain]
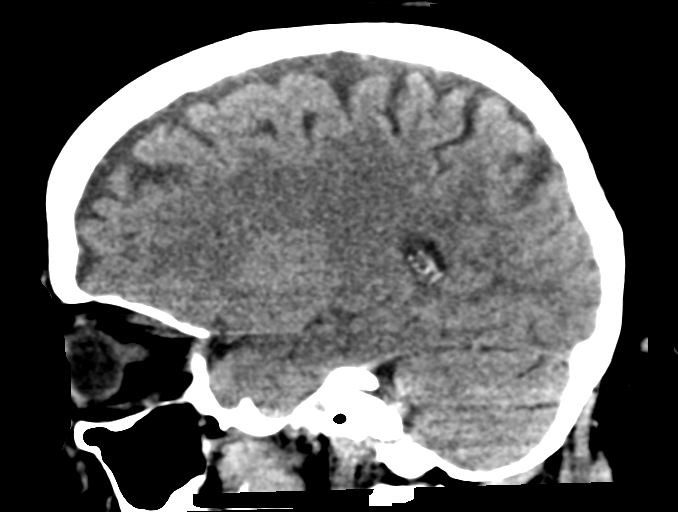

[15 of 46 positions shown; findings below may reference images not displayed]

FINDINGS: Brain: No evidence of acute infarction, hemorrhage, hydrocephalus,
extra-axial collection or mass lesion/mass effect.

Prominence of the ventricles and sulci reflects mild cortical volume
loss. Mild cerebellar atrophy is noted. Mild periventricular white
matter change likely reflects small vessel ischemic microangiopathy.

The brainstem and fourth ventricle are within normal limits. The
basal ganglia are unremarkable in appearance. The cerebral
hemispheres demonstrate grossly normal gray-white differentiation.
No mass effect or midline shift is seen.

Vascular: No hyperdense vessel or unexpected calcification.

Skull: There is no evidence of fracture; visualized osseous
structures are unremarkable in appearance.

Sinuses/Orbits: The visualized portions of the orbits are within
normal limits. The paranasal sinuses and mastoid air cells are
well-aerated.

Other: No significant soft tissue abnormalities are seen.
IMPRESSION: 1. No acute intracranial pathology seen on CT.
2. Mild cortical volume loss and scattered small vessel ischemic
microangiopathy.

## 2016-09-21 MED ORDER — SODIUM CHLORIDE 0.9 % IV BOLUS (SEPSIS)
1000.0000 mL | Freq: Once | INTRAVENOUS | Status: AC
  Administered 2016-09-21: 1000 mL via INTRAVENOUS

## 2016-09-21 MED ORDER — IOPAMIDOL (ISOVUE-300) INJECTION 61%
75.0000 mL | Freq: Once | INTRAVENOUS | Status: AC | PRN
  Administered 2016-09-21: 75 mL via INTRAVENOUS

## 2016-09-21 MED ORDER — DEXTROSE 5 % IV SOLN
1.0000 g | Freq: Once | INTRAVENOUS | Status: DC
Start: 2016-09-21 — End: 2016-09-22

## 2016-09-21 MED ORDER — ACETAMINOPHEN 500 MG PO TABS
1000.0000 mg | ORAL_TABLET | Freq: Once | ORAL | Status: AC
  Administered 2016-09-21: 1000 mg via ORAL
  Filled 2016-09-21: qty 2

## 2016-09-21 MED ORDER — CEFTRIAXONE SODIUM-DEXTROSE 1-3.74 GM-% IV SOLR
1.0000 g | Freq: Once | INTRAVENOUS | Status: AC
Start: 2016-09-21 — End: 2016-09-22
  Administered 2016-09-21: 1 g via INTRAVENOUS
  Filled 2016-09-21: qty 50

## 2016-09-21 MED ORDER — VANCOMYCIN HCL IN DEXTROSE 1-5 GM/200ML-% IV SOLN
1000.0000 mg | Freq: Once | INTRAVENOUS | Status: AC
Start: 2016-09-21 — End: 2016-09-22
  Administered 2016-09-21: 1000 mg via INTRAVENOUS
  Filled 2016-09-21: qty 200

## 2016-09-21 NOTE — ED Triage Notes (Signed)
Patient from home via [facility]. Reports daughter called EMS after finding mother in bed, confused. Daughter states patient has had trouble in the past with taking the correct dose of medicine and has accidentally overdosed on tussinex years ago. Patient reports taking too much of one her medicines but is unsure as to what. Patient alert to person only.

## 2016-09-21 NOTE — ED Provider Notes (Addendum)
[place] Provider Note   CSN: [id number] Arrival date & time: 09/21/16  2040     History   Chief Complaint Chief Complaint  Patient presents with  . Altered Mental Status    HPI Judith Shaw is a 66 y.o. female hx of CHF, DM, Hypertension, hyperlipidemia here presenting with altered mental status. Patient just came back from a cruise in the [place]. Patient lives by herself but daughter visits her daily. The son-in-law saw her around 4 PM today and she states that she's been coughing and just tired but she was at baseline. Daughter found her this evening altered and confused and naked and urinated on herself. No seizure activity was noticed. Patient was not sure if she took too much medicine and appears very confused. She does admit to offering last several days but no vomiting. No history of stroke in the past.   The history is provided by the patient. The history is limited by the condition of the patient.   Level V caveat- AMS    Past Medical History:  Diagnosis Date  . CHF (congestive heart failure) ([place])   . Depression   . Diabetes mellitus without complication ([place])   . Hyperlipidemia   . Hypertension     There are no active problems to display for this patient.   Past Surgical History:  Procedure Laterality Date  . ABDOMINAL HYSTERECTOMY    . CHOLECYSTECTOMY      OB History    No data available       Home Medications    Prior to Admission medications   Medication Sig Start Date End Date Taking? Authorizing Provider  aspirin EC 81 MG tablet Take 81 mg by mouth daily.   Yes Historical Provider, MD  carvedilol (COREG) 3.125 MG tablet Take 3.125 mg by mouth 2 (two) times daily with a meal.   Yes Historical Provider, MD  HUMALOG KWIKPEN 100 UNIT/ML KiwkPen Inject 16 Units into the skin 3 (three) times daily with meals.   Yes Historical Provider, MD  clotrimazole-betamethasone (LOTRISONE) cream Apply 1 application topically 2 (two) times  daily.    Historical Provider, MD  DULoxetine (CYMBALTA) 30 MG capsule Take 30 mg by mouth daily.    Historical Provider, MD  LANTUS 100 UNIT/ML injection Inject 300 Units into the skin daily.    Historical Provider, MD  metFORMIN (GLUCOPHAGE-XR) 500 MG 24 hr tablet Take 1,000 mg by mouth 2 (two) times daily.    Historical Provider, MD  potassium chloride (MICRO-K) 10 MEQ CR capsule Take 10 mEq by mouth daily.    Historical Provider, MD  ramipril (ALTACE) 5 MG capsule Take 5 mg by mouth daily.    Historical Provider, MD  simvastatin (ZOCOR) 40 MG tablet Take 40 mg by mouth every evening.    Historical Provider, MD    Family History No family history on file.  Social History Social History  Substance Use Topics  . Smoking status: Never Smoker  . Smokeless tobacco: Never Used  . Alcohol use No     Allergies   Patient has no known allergies.   Review of Systems Review of Systems  Psychiatric/Behavioral: Positive for confusion.  All other systems reviewed and are negative.    Physical Exam Updated Vital Signs BP (!) 138/38   Pulse 96   Temp 97.6 F (36.4 C) (Oral)   Resp (!) 23   Ht 5\' 4"  (1.626 m)   Wt 240 lb (108.9 kg)   SpO2 100%  BMI 41.20 kg/m   Physical Exam  Constitutional:  Confused, covered in urine   HENT:  Head: Normocephalic.  Mouth/Throat: Oropharynx is clear and moist.  Eyes: EOM are normal. Pupils are equal, round, and reactive to light.  Neck: Normal range of motion. Neck supple.  Cardiovascular: Normal rate, regular rhythm and normal heart sounds.   Pulmonary/Chest:  Diminished breath sounds bilateral bases   Abdominal: Soft. Bowel sounds are normal. She exhibits no distension. There is no tenderness. There is no guarding.  Musculoskeletal: Normal range of motion.  Neurological:  Confused. No obvious facial droop. No pronator drift, nl strength throughout. Difficult following commands but no obvious focal weakness   Skin: Skin is warm.    Psychiatric:  Unable   Nursing note and vitals reviewed.    ED Treatments / Results  Labs (all labs ordered are listed, but only abnormal results are displayed) Labs Reviewed  CBC WITH DIFFERENTIAL/PLATELET - Abnormal; Notable for the following:       Result Value   WBC 22.2 (*)    Neutro Abs 20.5 (*)    Lymphs Abs 0.6 (*)    Monocytes Absolute 1.1 (*)    All other components within normal limits  COMPREHENSIVE METABOLIC PANEL - Abnormal; Notable for the following:    Sodium 132 (*)    Chloride 100 (*)    CO2 21 (*)    Glucose, Bld 263 (*)    Creatinine, Ser 1.11 (*)    Albumin 3.4 (*)    GFR calc non Af Amer 51 (*)    GFR calc Af Amer 59 (*)    All other components within normal limits  TROPONIN I - Abnormal; Notable for the following:    Troponin I 0.04 (*)    All other components within normal limits  ACETAMINOPHEN LEVEL - Abnormal; Notable for the following:    Acetaminophen (Tylenol), Serum <10 (*)    All other components within normal limits  ETHANOL  SALICYLATE LEVEL  URINALYSIS, COMPLETE (UACMP) WITH MICROSCOPIC  URINE DRUG SCREEN, QUALITATIVE ([facility] ONLY)  INFLUENZA PANEL BY PCR (TYPE A & B)    EKG  EKG Interpretation None      ED ECG REPORT I, Wandra Arthurs, the attending physician, personally viewed and interpreted this ECG.   Date: 09/21/2016  EKG Time: 20:47 pm  Rate: 93  Rhythm: normal EKG, normal sinus rhythm  Axis: normal  Intervals:none  ST&T Change: nonspecific    Radiology Dg Chest 1 View  Result Date: 09/21/2016 CLINICAL DATA:  66 y/o F; altered mental status, weakness, possible overdose. EXAM: CHEST 1 VIEW COMPARISON:  02/25/2014 CT of the chest. FINDINGS: The heart size and mediastinal contours are within normal limits and stable given projection and technique. Both lungs are clear. The visualized skeletal structures are unremarkable. IMPRESSION: No active disease. Electronically Signed   By: Kristine Garbe M.D.   On:  09/21/2016 21:45   Ct Head Wo Contrast  Result Date: 09/21/2016 CLINICAL DATA:  Acute onset of altered mental status. Initial encounter. EXAM: CT HEAD WITHOUT CONTRAST TECHNIQUE: Contiguous axial images were obtained from the base of the skull through the vertex without intravenous contrast. COMPARISON:  CT of the head performed 09/29/2009 FINDINGS: Brain: No evidence of acute infarction, hemorrhage, hydrocephalus, extra-axial collection or mass lesion/mass effect. Prominence of the ventricles and sulci reflects mild cortical volume loss. Mild cerebellar atrophy is noted. Mild periventricular white matter change likely reflects small vessel ischemic microangiopathy. The brainstem and fourth ventricle  are within normal limits. The basal ganglia are unremarkable in appearance. The cerebral hemispheres demonstrate grossly normal gray-white differentiation. No mass effect or midline shift is seen. Vascular: No hyperdense vessel or unexpected calcification. Skull: There is no evidence of fracture; visualized osseous structures are unremarkable in appearance. Sinuses/Orbits: The visualized portions of the orbits are within normal limits. The paranasal sinuses and mastoid air cells are well-aerated. Other: No significant soft tissue abnormalities are seen. IMPRESSION: 1. No acute intracranial pathology seen on CT. 2. Mild cortical volume loss and scattered small vessel ischemic microangiopathy. Electronically Signed   By: Garald Balding M.D.   On: 09/21/2016 21:22    Procedures Procedures (including critical care time)  Medications Ordered in ED Medications  sodium chloride 0.9 % bolus 1,000 mL (1,000 mLs Intravenous New Bag/Given 09/21/16 2154)     Initial Impression / Assessment and Plan / ED Course  I have reviewed the triage vital signs and the nursing notes.  Pertinent labs & imaging results that were available during my care of the patient were reviewed by me and considered in my medical decision  making (see chart for details).     AMORY MARCELLINO is a 66 y.o. female here with AMS. Afebrile in the ED. Had recent cruise to the [place]. Consider flu vs stroke vs pneumonia vs UTI. Will do sepsis workup. No focal deficit to warrant TPA. Will get labs, CXR, UA, CT head.   10:33 PM CT head nl. WBC 22. CXR clear. I think likely flu vs UTI.   11:30 PM UA + UTI. Flu neg. CT chest pending since she is coughing. Given rocephin. If  CT showed pneumonia, will add azithromycin. Signed out to Dr. Owens Shark to follow up CT. Will need admission. Tox neg.     Final Clinical Impressions(s) / ED Diagnoses   Final diagnoses:  None    New Prescriptions New Prescriptions   No medications on file     Drenda Freeze, MD 09/21/16 [place] Yao, MD 09/21/16 [phone]

## 2016-09-22 ENCOUNTER — Inpatient Hospital Stay: Payer: Medicare Other

## 2016-09-22 LAB — BASIC METABOLIC PANEL
Anion gap: 11 (ref 5–15)
BUN: 20 mg/dL (ref 6–20)
CHLORIDE: 101 mmol/L (ref 101–111)
CO2: 20 mmol/L — AB (ref 22–32)
Calcium: 8.4 mg/dL — ABNORMAL LOW (ref 8.9–10.3)
Creatinine, Ser: 1.12 mg/dL — ABNORMAL HIGH (ref 0.44–1.00)
GFR calc non Af Amer: 50 mL/min — ABNORMAL LOW (ref >60)
GFR, EST AFRICAN AMERICAN: 58 mL/min — AB (ref >60)
Glucose, Bld: 272 mg/dL — ABNORMAL HIGH (ref 65–99)
POTASSIUM: 5 mmol/L (ref 3.5–5.1)
SODIUM: 132 mmol/L — AB (ref 135–145)

## 2016-09-22 LAB — CBC
HEMATOCRIT: 41.7 % (ref 35.0–47.0)
Hemoglobin: 14.2 g/dL (ref 12.0–16.0)
MCH: 30.4 pg (ref 26.0–34.0)
MCHC: 34 g/dL (ref 32.0–36.0)
MCV: 89.5 fL (ref 80.0–100.0)
Platelets: 232 10*3/uL (ref 150–440)
RBC: 4.66 MIL/uL (ref 3.80–5.20)
RDW: 13.8 % (ref 11.5–14.5)
WBC: 23.1 10*3/uL — AB (ref 3.6–11.0)

## 2016-09-22 LAB — URINE DRUG SCREEN, QUALITATIVE (ARMC ONLY)
AMPHETAMINES, UR SCREEN: NOT DETECTED
Barbiturates, Ur Screen: NOT DETECTED
Benzodiazepine, Ur Scrn: NOT DETECTED
CANNABINOID 50 NG, UR ~~LOC~~: NOT DETECTED
COCAINE METABOLITE, UR ~~LOC~~: NOT DETECTED
MDMA (Ecstasy)Ur Screen: NOT DETECTED
METHADONE SCREEN, URINE: NOT DETECTED
Opiate, Ur Screen: NOT DETECTED
Phencyclidine (PCP) Ur S: NOT DETECTED
TRICYCLIC, UR SCREEN: NOT DETECTED

## 2016-09-22 LAB — TROPONIN I
Troponin I: 0.05 ng/mL (ref <0.03)
Troponin I: 0.05 ng/mL (ref <0.03)

## 2016-09-22 LAB — LACTIC ACID, PLASMA
Lactic Acid, Venous: 2.1 mmol/L (ref 0.5–1.9)
Lactic Acid, Venous: 2.5 mmol/L (ref 0.5–1.9)

## 2016-09-22 LAB — GLUCOSE, CAPILLARY
GLUCOSE-CAPILLARY: 283 mg/dL — AB (ref 65–99)
GLUCOSE-CAPILLARY: 251 mg/dL — AB (ref 65–99)
Glucose-Capillary: 306 mg/dL — ABNORMAL HIGH (ref 65–99)

## 2016-09-22 MED ORDER — ONDANSETRON HCL 4 MG/2ML IJ SOLN: 4.0000 mg | Freq: Four times a day (QID) | INTRAMUSCULAR | Status: DC | PRN

## 2016-09-22 MED ORDER — INSULIN GLARGINE 100 UNIT/ML ~~LOC~~ SOLN
35.0000 [IU] | Freq: Every day | SUBCUTANEOUS | Status: DC
  Administered 2016-09-22 – 2016-09-24 (×3): 35 [IU] via SUBCUTANEOUS
  Filled 2016-09-22 (×3): qty 0.35

## 2016-09-22 MED ORDER — ENOXAPARIN SODIUM 40 MG/0.4ML ~~LOC~~ SOLN
40.0000 mg | Freq: Two times a day (BID) | SUBCUTANEOUS | Status: DC
  Administered 2016-09-22 – 2016-09-25 (×7): 40 mg via SUBCUTANEOUS
  Filled 2016-09-22 (×7): qty 0.4

## 2016-09-22 MED ORDER — ACETAMINOPHEN 325 MG PO TABS
650.0000 mg | ORAL_TABLET | Freq: Four times a day (QID) | ORAL | Status: DC | PRN
  Filled 2016-09-22: qty 2

## 2016-09-22 MED ORDER — INSULIN ASPART 100 UNIT/ML ~~LOC~~ SOLN
0.0000 [IU] | Freq: Three times a day (TID) | SUBCUTANEOUS | Status: DC
  Administered 2016-09-22: 13:00:00 7 [IU] via SUBCUTANEOUS
  Administered 2016-09-22 (×2): 5 [IU] via SUBCUTANEOUS
  Administered 2016-09-23: 12:00:00 7 [IU] via SUBCUTANEOUS
  Administered 2016-09-23 – 2016-09-24 (×3): 5 [IU] via SUBCUTANEOUS
  Administered 2016-09-24 (×2): 3 [IU] via SUBCUTANEOUS
  Administered 2016-09-25: 5 [IU] via SUBCUTANEOUS
  Administered 2016-09-25: 08:00:00 7 [IU] via SUBCUTANEOUS
  Filled 2016-09-22: qty 7
  Filled 2016-09-22: qty 5
  Filled 2016-09-22: qty 7
  Filled 2016-09-22: qty 5
  Filled 2016-09-22: qty 3
  Filled 2016-09-22: qty 5
  Filled 2016-09-22: qty 7
  Filled 2016-09-22: qty 5
  Filled 2016-09-22: qty 3
  Filled 2016-09-22 (×2): qty 5

## 2016-09-22 MED ORDER — CEFEPIME HCL 2 G IJ SOLR
2.0000 g | Freq: Two times a day (BID) | INTRAMUSCULAR | Status: DC
  Administered 2016-09-22: 2 g via INTRAVENOUS
  Filled 2016-09-22 (×2): qty 2

## 2016-09-22 MED ORDER — DEXTROSE 5 % IV SOLN
2.0000 g | Freq: Three times a day (TID) | INTRAVENOUS | Status: DC
  Administered 2016-09-22 – 2016-09-23 (×3): 2 g via INTRAVENOUS
  Filled 2016-09-22 (×5): qty 2

## 2016-09-22 MED ORDER — ONDANSETRON HCL 4 MG PO TABS: 4.0000 mg | ORAL_TABLET | Freq: Four times a day (QID) | ORAL | Status: DC | PRN

## 2016-09-22 MED ORDER — DULOXETINE HCL 30 MG PO CPEP
30.0000 mg | ORAL_CAPSULE | Freq: Every day | ORAL | Status: DC
  Administered 2016-09-22 – 2016-09-25 (×4): 30 mg via ORAL
  Filled 2016-09-22 (×4): qty 1

## 2016-09-22 MED ORDER — SODIUM POLYSTYRENE SULFONATE 15 GM/60ML PO SUSP
15.0000 g | Freq: Once | ORAL | Status: AC
  Administered 2016-09-22: 15 g via ORAL
  Filled 2016-09-22: qty 60

## 2016-09-22 MED ORDER — VANCOMYCIN HCL 10 G IV SOLR
1250.0000 mg | INTRAVENOUS | Status: DC
  Administered 2016-09-22: 1250 mg via INTRAVENOUS
  Filled 2016-09-22 (×2): qty 1250

## 2016-09-22 MED ORDER — SIMVASTATIN 40 MG PO TABS
40.0000 mg | ORAL_TABLET | Freq: Every evening | ORAL | Status: DC
  Administered 2016-09-22 – 2016-09-24 (×3): 40 mg via ORAL
  Filled 2016-09-22 (×3): qty 1

## 2016-09-22 MED ORDER — SODIUM CHLORIDE 0.9% FLUSH
3.0000 mL | Freq: Two times a day (BID) | INTRAVENOUS | Status: DC
  Administered 2016-09-22 – 2016-09-25 (×7): 3 mL via INTRAVENOUS

## 2016-09-22 MED ORDER — ACETAMINOPHEN 160 MG/5ML PO SOLN
650.0000 mg | Freq: Four times a day (QID) | ORAL | Status: DC | PRN
  Administered 2016-09-22 – 2016-09-23 (×3): 650 mg via ORAL
  Filled 2016-09-22 (×3): qty 20.3

## 2016-09-22 MED ORDER — CARVEDILOL 3.125 MG PO TABS
3.1250 mg | ORAL_TABLET | Freq: Two times a day (BID) | ORAL | Status: DC
Start: 2016-09-22 — End: 2016-09-23
  Administered 2016-09-22 (×2): 3.125 mg via ORAL
  Filled 2016-09-22 (×3): qty 1

## 2016-09-22 MED ORDER — ACETAMINOPHEN 650 MG RE SUPP: 650.0000 mg | Freq: Four times a day (QID) | RECTAL | Status: DC | PRN

## 2016-09-22 MED ORDER — INSULIN ASPART 100 UNIT/ML ~~LOC~~ SOLN
0.0000 [IU] | Freq: Every day | SUBCUTANEOUS | Status: DC
  Administered 2016-09-22 – 2016-09-24 (×3): 3 [IU] via SUBCUTANEOUS
  Filled 2016-09-22 (×3): qty 3

## 2016-09-22 MED ORDER — SODIUM CHLORIDE 0.9 % IV SOLN
INTRAVENOUS | Status: DC
  Administered 2016-09-22: 05:00:00 via INTRAVENOUS

## 2016-09-22 MED ORDER — RAMIPRIL 5 MG PO CAPS
5.0000 mg | ORAL_CAPSULE | Freq: Every day | ORAL | Status: DC
  Administered 2016-09-22: 5 mg via ORAL
  Filled 2016-09-22: qty 1

## 2016-09-22 MED ORDER — ASPIRIN EC 81 MG PO TBEC
81.0000 mg | DELAYED_RELEASE_TABLET | Freq: Every day | ORAL | Status: DC
  Administered 2016-09-22 – 2016-09-25 (×4): 81 mg via ORAL
  Filled 2016-09-22 (×4): qty 1

## 2016-09-22 NOTE — ED Notes (Signed)
Pts medications counted and walked to pharmacy

## 2016-09-22 NOTE — Progress Notes (Signed)
[place] [facility] [place] NAME: Judith Shaw    MR#:  [id number]  DATE OF BIRTH:  [birth year]  SUBJECTIVE:  CHIEF COMPLAINT:   Chief Complaint  Patient presents with  . Altered Mental Status   Improving. Pain and redness right leg.  REVIEW OF SYSTEMS:    Review of Systems  Constitutional: Positive for malaise/fatigue. Negative for chills and fever.  HENT: Negative for sore throat.   Eyes: Negative for blurred vision, double vision and pain.  Respiratory: Negative for cough, hemoptysis, shortness of breath and wheezing.   Cardiovascular: Negative for chest pain, palpitations, orthopnea and leg swelling.  Gastrointestinal: Negative for abdominal pain, constipation, diarrhea, heartburn, nausea and vomiting.  Genitourinary: Negative for dysuria and hematuria.  Musculoskeletal: Positive for joint pain. Negative for back pain.  Skin: Negative for rash.  Neurological: Negative for sensory change, speech change, focal weakness and headaches.  Endo/Heme/Allergies: Does not bruise/bleed easily.  Psychiatric/Behavioral: Negative for depression. The patient is not nervous/anxious.     DRUG ALLERGIES:  No Known Allergies  VITALS:  Blood pressure (!) 115/91, pulse 84, temperature 98.6 F (37 C), temperature source Oral, resp. rate 18, height 5\' 5"  (1.651 m), weight 118.9 kg (262 lb 1.6 oz), SpO2 100 %.  PHYSICAL EXAMINATION:   Physical Exam  GENERAL:  66 y.o.-year-old patient lying in the bed with no acute distress.  EYES: Pupils equal, round, reactive to light and accommodation. No scleral icterus. Extraocular muscles intact.  HEENT: Head atraumatic, normocephalic. Oropharynx and nasopharynx clear.  NECK:  Supple, no jugular venous distention. No thyroid enlargement, no tenderness.  LUNGS: Normal breath sounds bilaterally, no wheezing, rales, rhonchi. No use of accessory muscles of respiration.  CARDIOVASCULAR: S1, S2 normal. No murmurs, rubs, or  gallops.  ABDOMEN: Soft, nontender, nondistended. Bowel sounds present. No organomegaly or mass.  EXTREMITIES: RLE warm and red. Foot and leg. Open skin breakdown right foot dorsum NEUROLOGIC: Cranial nerves II through XII are intact. No focal Motor or sensory deficits b/l.   PSYCHIATRIC: The patient is alert and oriented x 3.  SKIN: No obvious rash, lesion, or ulcer.   LABORATORY PANEL:   CBC  Recent Labs Lab 09/22/16 0814  WBC 23.1*  HGB 14.2  HCT 41.7  PLT 232   ------------------------------------------------------------------------------------------------------------------ Chemistries   Recent Labs Lab 09/21/16 2052 09/22/16 0814  NA 132* 132*  K 4.4 5.0  CL 100* 101  CO2 21* 20*  GLUCOSE 263* 272*  BUN 18 20  CREATININE 1.11* 1.12*  CALCIUM 9.0 8.4*  AST 26  --   ALT 19  --   ALKPHOS 80  --   BILITOT 0.8  --    ------------------------------------------------------------------------------------------------------------------  Cardiac Enzymes  Recent Labs Lab 09/22/16 0814  TROPONINI 0.05*   ------------------------------------------------------------------------------------------------------------------  RADIOLOGY:  Dg Chest 1 View  Result Date: 09/21/2016 CLINICAL DATA:  66 y/o F; altered mental status, weakness, possible overdose. EXAM: CHEST 1 VIEW COMPARISON:  02/25/2014 CT of the chest. FINDINGS: The heart size and mediastinal contours are within normal limits and stable given projection and technique. Both lungs are clear. The visualized skeletal structures are unremarkable. IMPRESSION: No active disease. Electronically Signed   By: Kristine Garbe M.D.   On: 09/21/2016 21:45   Ct Head Wo Contrast  Result Date: 09/21/2016 CLINICAL DATA:  Acute onset of altered mental status. Initial encounter. EXAM: CT HEAD WITHOUT CONTRAST TECHNIQUE: Contiguous axial images were obtained from the base of the skull through the vertex without  intravenous  contrast. COMPARISON:  CT of the head performed 09/29/2009 FINDINGS: Brain: No evidence of acute infarction, hemorrhage, hydrocephalus, extra-axial collection or mass lesion/mass effect. Prominence of the ventricles and sulci reflects mild cortical volume loss. Mild cerebellar atrophy is noted. Mild periventricular white matter change likely reflects small vessel ischemic microangiopathy. The brainstem and fourth ventricle are within normal limits. The basal ganglia are unremarkable in appearance. The cerebral hemispheres demonstrate grossly normal gray-white differentiation. No mass effect or midline shift is seen. Vascular: No hyperdense vessel or unexpected calcification. Skull: There is no evidence of fracture; visualized osseous structures are unremarkable in appearance. Sinuses/Orbits: The visualized portions of the orbits are within normal limits. The paranasal sinuses and mastoid air cells are well-aerated. Other: No significant soft tissue abnormalities are seen. IMPRESSION: 1. No acute intracranial pathology seen on CT. 2. Mild cortical volume loss and scattered small vessel ischemic microangiopathy. Electronically Signed   By: Garald Balding M.D.   On: 09/21/2016 21:22   Ct Chest W Contrast  Result Date: 09/21/2016 CLINICAL DATA:  Acute onset of cough, fever and night sweats. Shortness of breath. Initial encounter. EXAM: CT CHEST WITH CONTRAST TECHNIQUE: Multidetector CT imaging of the chest was performed during intravenous contrast administration. CONTRAST:  34mL ISOVUE-300 IOPAMIDOL (ISOVUE-300) INJECTION 61% COMPARISON:  Chest radiograph performed earlier today at 9:18 p.m., and CTA of the chest performed 02/25/2014 FINDINGS: Cardiovascular: The heart is borderline enlarged. The thoracic aorta is unremarkable. The great vessels are unremarkable in appearance. No calcific atherosclerotic disease is seen. Mediastinum/Nodes: The mediastinum is unremarkable in appearance. No mediastinal lymphadenopathy  is seen. No pericardial effusion is identified. The thyroid gland is unremarkable. No axillary lymphadenopathy is seen. Lungs/Pleura: Mild patchy bibasilar airspace opacities may reflect atelectasis or possibly mild interstitial edema. The lungs are otherwise clear. No pleural effusion or pneumothorax is seen. No masses are identified. Upper Abdomen: The visualized portions of the liver and spleen are grossly unremarkable. The patient is status post cholecystectomy, with clips noted at the gallbladder fossa. The visualized portions of the pancreas, adrenal glands and kidneys are unremarkable, aside from mild left renal scarring. Musculoskeletal: No acute osseous abnormalities are identified. The visualized musculature is unremarkable in appearance. IMPRESSION: 1. Mild patchy bibasilar airspace opacities may reflect atelectasis or possibly mild interstitial edema. 2. Borderline cardiomegaly. 3. Mild left renal scarring. Electronically Signed   By: Garald Balding M.D.   On: 09/21/2016 23:34     ASSESSMENT AND PLAN:   * UTI * RLE cellulitis * Sepsis - POA * Acute encephalopathy - resolved * IDDM * HTN  ON IV cefepime Stop IVF Check RLE venous doppler Await cx.  All the records are reviewed and case discussed with Care Management/Social Workerr. Management plans discussed with the patient, family and they are in agreement.  CODE STATUS: FULL CODE  DVT Prophylaxis: SCDs  TOTAL TIME TAKING CARE OF THIS PATIENT: 35 minutes.   POSSIBLE D/C IN 1-2 DAYS, DEPENDING ON CLINICAL CONDITION.  Hillary Bow R M.D on 09/22/2016 at 1:45 PM  Between 7am to 6pm - Pager - [phone]  After 6pm go to [email or web address] - password [email or web address] [facility]  Office  [phone]  CC: Primary care physician; [facility]  Note: This dictation was prepared with Dragon dictation along with smaller phrase technology. Any transcriptional errors that result from this process are  unintentional.

## 2016-09-22 NOTE — ED Notes (Signed)
[name] (sister)  [phone]

## 2016-09-22 NOTE — ED Notes (Signed)
Admitting MD at bedside.

## 2016-09-22 NOTE — Progress Notes (Signed)
Pt has critical Lactic acid of 2.5.  MD notified.  Awaiting response.

## 2016-09-22 NOTE — H&P (Signed)
[facility] NAME: Judith Shaw    MR#:  [id number]  DATE OF BIRTH:  May 19, 1951  DATE OF ADMISSION:  09/21/2016  PRIMARY CARE PHYSICIAN: [facility]   REQUESTING/REFERRING PHYSICIAN: Owens Shark, MD  CHIEF COMPLAINT:   Chief Complaint  Patient presents with  . Altered Mental Status    HISTORY OF PRESENT ILLNESS:  Judith Shaw  is a 66 y.o. female who presents with Confusion, new urinary incontinence, weakness. Patient states she returned from a cruise, and got snowed in her house. In that time she was not very mobile, and began developing the symptoms of generalized weakness and increased urinary frequency and later urinary incontinence. She then began to develop some confusion. She was prompted to come in to the hospital today by family members were concerned for her. Here she was found to meet sepsis criteria and has a UTI. Hospitalists were called for admission and further workup.  PAST MEDICAL HISTORY:   Past Medical History:  Diagnosis Date  . CHF (congestive heart failure) ([place])   . Depression   . Diabetes mellitus without complication ([place])   . Hyperlipidemia   . Hypertension     PAST SURGICAL HISTORY:   Past Surgical History:  Procedure Laterality Date  . ABDOMINAL HYSTERECTOMY    . CHOLECYSTECTOMY      SOCIAL HISTORY:   Social History  Substance Use Topics  . Smoking status: Never Smoker  . Smokeless tobacco: Never Used  . Alcohol use No    FAMILY HISTORY:   Family History  Problem Relation Age of Onset  . Hypertension Mother   . Heart disease Father     DRUG ALLERGIES:  No Known Allergies  MEDICATIONS AT HOME:   Prior to Admission medications   Medication Sig Start Date End Date Taking? Authorizing Provider  aspirin EC 81 MG tablet Take 81 mg by mouth daily.   Yes Historical Provider, MD  carvedilol (COREG) 3.125 MG tablet Take 3.125 mg by mouth 2 (two) times daily with a meal.   Yes  Historical Provider, MD  clotrimazole-betamethasone (LOTRISONE) cream Apply 1 application topically 2 (two) times daily.   Yes Historical Provider, MD  DULoxetine (CYMBALTA) 30 MG capsule Take 30 mg by mouth daily.   Yes Historical Provider, MD  HUMALOG KWIKPEN 100 UNIT/ML KiwkPen Inject 16 Units into the skin 3 (three) times daily with meals.   Yes Historical Provider, MD  LANTUS 100 UNIT/ML injection Inject 300 Units into the skin daily.   Yes Historical Provider, MD  metFORMIN (GLUCOPHAGE-XR) 500 MG 24 hr tablet Take 1,000 mg by mouth 2 (two) times daily.   Yes Historical Provider, MD  potassium chloride (MICRO-K) 10 MEQ CR capsule Take 10 mEq by mouth daily.   Yes Historical Provider, MD  ramipril (ALTACE) 5 MG capsule Take 5 mg by mouth daily.   Yes Historical Provider, MD  simvastatin (ZOCOR) 40 MG tablet Take 40 mg by mouth every evening.   Yes Historical Provider, MD    REVIEW OF SYSTEMS:  Review of Systems  Constitutional: Positive for fever and malaise/fatigue. Negative for chills and weight loss.  HENT: Negative for ear pain, hearing loss and tinnitus.   Eyes: Negative for blurred vision, double vision, pain and redness.  Respiratory: Negative for cough, hemoptysis and shortness of breath.   Cardiovascular: Negative for chest pain, palpitations, orthopnea and leg swelling.  Gastrointestinal: Negative for abdominal pain, constipation, diarrhea, nausea and vomiting.  Genitourinary: Positive for frequency. Negative for dysuria and hematuria.       Incontinence  Musculoskeletal: Negative for back pain, joint pain and neck pain.  Skin:       No acne, rash, or lesions  Neurological: Positive for weakness. Negative for dizziness, tremors and focal weakness.  Endo/Heme/Allergies: Negative for polydipsia. Does not bruise/bleed easily.  Psychiatric/Behavioral: Negative for depression. The patient is not nervous/anxious and does not have insomnia.      VITAL SIGNS:   Vitals:    09/21/16 2104 09/21/16 2106 09/21/16 2143 09/21/16 2246  BP:   (!) 138/38   Pulse: 92  96   Resp: (!) 22  (!) 23   Temp: 97.6 F (36.4 C)   (!) 102.9 F (39.4 C)  TempSrc: Oral   Rectal  SpO2: 100%  100%   Weight:  108.9 kg (240 lb)    Height:  5\' 4"  (1.626 m)     Wt Readings from Last 3 Encounters:  09/21/16 108.9 kg (240 lb)    PHYSICAL EXAMINATION:  Physical Exam  Vitals reviewed. Constitutional: She is oriented to person, place, and time. She appears well-developed and well-nourished. No distress.  HENT:  Head: Normocephalic and atraumatic.  Mouth/Throat: Oropharynx is clear and moist.  Eyes: Conjunctivae and EOM are normal. Pupils are equal, round, and reactive to light. No scleral icterus.  Neck: Normal range of motion. Neck supple. No JVD present. No thyromegaly present.  Cardiovascular: Normal rate, regular rhythm and intact distal pulses.  Exam reveals no gallop and no friction rub.   No murmur heard. Respiratory: Effort normal and breath sounds normal. No respiratory distress. She has no wheezes. She has no rales.  GI: Soft. Bowel sounds are normal. She exhibits no distension. There is no tenderness.  Musculoskeletal: Normal range of motion. She exhibits no edema.  No arthritis, no gout  Lymphadenopathy:    She has no cervical adenopathy.  Neurological: She is alert and oriented to person, place, and time. No cranial nerve deficit.  No dysarthria, no aphasia  Skin: Skin is warm and dry. No rash noted. No erythema.  Psychiatric: She has a normal mood and affect. Her behavior is normal. Judgment and thought content normal.    LABORATORY PANEL:   CBC  Recent Labs Lab 09/21/16 2052  WBC 22.2*  HGB 14.3  HCT 41.8  PLT 247   ------------------------------------------------------------------------------------------------------------------  Chemistries   Recent Labs Lab 09/21/16 2052  NA 132*  K 4.4  CL 100*  CO2 21*  GLUCOSE 263*  BUN 18  CREATININE  1.11*  CALCIUM 9.0  AST 26  ALT 19  ALKPHOS 80  BILITOT 0.8   ------------------------------------------------------------------------------------------------------------------  Cardiac Enzymes  Recent Labs Lab 09/21/16 2052  TROPONINI 0.04*   ------------------------------------------------------------------------------------------------------------------  RADIOLOGY:  Dg Chest 1 View  Result Date: 09/21/2016 CLINICAL DATA:  67 y/o F; altered mental status, weakness, possible overdose. EXAM: CHEST 1 VIEW COMPARISON:  02/25/2014 CT of the chest. FINDINGS: The heart size and mediastinal contours are within normal limits and stable given projection and technique. Both lungs are clear. The visualized skeletal structures are unremarkable. IMPRESSION: No active disease. Electronically Signed   By: Kristine Garbe M.D.   On: 09/21/2016 21:45   Ct Head Wo Contrast  Result Date: 09/21/2016 CLINICAL DATA:  Acute onset of altered mental status. Initial encounter. EXAM: CT HEAD WITHOUT CONTRAST TECHNIQUE: Contiguous axial images were obtained from the base of the skull through the vertex without intravenous contrast. COMPARISON:  CT  of the head performed 09/29/2009 FINDINGS: Brain: No evidence of acute infarction, hemorrhage, hydrocephalus, extra-axial collection or mass lesion/mass effect. Prominence of the ventricles and sulci reflects mild cortical volume loss. Mild cerebellar atrophy is noted. Mild periventricular white matter change likely reflects small vessel ischemic microangiopathy. The brainstem and fourth ventricle are within normal limits. The basal ganglia are unremarkable in appearance. The cerebral hemispheres demonstrate grossly normal gray-white differentiation. No mass effect or midline shift is seen. Vascular: No hyperdense vessel or unexpected calcification. Skull: There is no evidence of fracture; visualized osseous structures are unremarkable in appearance. Sinuses/Orbits:  The visualized portions of the orbits are within normal limits. The paranasal sinuses and mastoid air cells are well-aerated. Other: No significant soft tissue abnormalities are seen. IMPRESSION: 1. No acute intracranial pathology seen on CT. 2. Mild cortical volume loss and scattered small vessel ischemic microangiopathy. Electronically Signed   By: Garald Balding M.D.   On: 09/21/2016 21:22   Ct Chest W Contrast  Result Date: 09/21/2016 CLINICAL DATA:  Acute onset of cough, fever and night sweats. Shortness of breath. Initial encounter. EXAM: CT CHEST WITH CONTRAST TECHNIQUE: Multidetector CT imaging of the chest was performed during intravenous contrast administration. CONTRAST:  20mL ISOVUE-300 IOPAMIDOL (ISOVUE-300) INJECTION 61% COMPARISON:  Chest radiograph performed earlier today at 9:18 p.m., and CTA of the chest performed 02/25/2014 FINDINGS: Cardiovascular: The heart is borderline enlarged. The thoracic aorta is unremarkable. The great vessels are unremarkable in appearance. No calcific atherosclerotic disease is seen. Mediastinum/Nodes: The mediastinum is unremarkable in appearance. No mediastinal lymphadenopathy is seen. No pericardial effusion is identified. The thyroid gland is unremarkable. No axillary lymphadenopathy is seen. Lungs/Pleura: Mild patchy bibasilar airspace opacities may reflect atelectasis or possibly mild interstitial edema. The lungs are otherwise clear. No pleural effusion or pneumothorax is seen. No masses are identified. Upper Abdomen: The visualized portions of the liver and spleen are grossly unremarkable. The patient is status post cholecystectomy, with clips noted at the gallbladder fossa. The visualized portions of the pancreas, adrenal glands and kidneys are unremarkable, aside from mild left renal scarring. Musculoskeletal: No acute osseous abnormalities are identified. The visualized musculature is unremarkable in appearance. IMPRESSION: 1. Mild patchy bibasilar  airspace opacities may reflect atelectasis or possibly mild interstitial edema. 2. Borderline cardiomegaly. 3. Mild left renal scarring. Electronically Signed   By: Garald Balding M.D.   On: 09/21/2016 23:34    EKG:   Orders placed or performed during the hospital encounter of 09/21/16  . EKG 12-Lead  . EKG 12-Lead  . EKG 12-Lead  . EKG 12-Lead    IMPRESSION AND PLAN:  Principal Problem:   Sepsis ([place]) - broad spectrum IV antibiotics started in the ED and continued on admission, cultures sent from the ED, patient is hemodynamically stable, lactic acid very mildly elevated, IV fluids in Place and will check lactic acid until within normal limits. Active Problems:   UTI (urinary tract infection) - antibiotics and cultures as above   HTN (hypertension) - stable, continue home meds   Diabetes ([place]) - sliding scale insulin with corresponding glucose checks   HLD (hyperlipidemia) - continue home meds   Depression - continue home antidepressants  All the records are reviewed and case discussed with ED provider. Management plans discussed with the patient and/or family.  DVT PROPHYLAXIS: SubQ lovenox  GI PROPHYLAXIS: PPI  ADMISSION STATUS: Inpatient  CODE STATUS: Full Code Status History    This patient does not have a recorded code status. Please follow your  organizational policy for patients in this situation.      TOTAL TIME TAKING CARE OF THIS PATIENT: 45 minutes.    Stefana Lodico Clifton 09/22/2016, 12:00 AM  [facility]  Office  [phone]  CC: Primary care physician; [place]

## 2016-09-22 NOTE — Progress Notes (Addendum)
Pharmacy Antibiotic Note  Judith Shaw is a 66 y.o. female admitted on 09/21/2016 with sepsis and UTI.  Pharmacy has been consulted for vancomycin and cefepime dosing.  Plan: DW 76kg   Vd 53L kei 0.054 hr-1  T1/2 13 hours Vancomycin 1250 mg q 18 hours ordered. Level before 5th dose. Goal trough 15-20.  Cefepime 2 grams q 12 hours ordered.  Height: 5\' 4"  (162.6 cm) Weight: 240 lb (108.9 kg) IBW/kg (Calculated) : 54.7  Temp (24hrs), Avg:99.3 F (37.4 C), Min:97.6 F (36.4 C), Max:102.9 F (39.4 C)   Recent Labs Lab 09/21/16 2052 09/21/16 2252  WBC 22.2*  --   CREATININE 1.11*  --   LATICACIDVEN  --  2.1*    Estimated Creatinine Clearance: 60.1 mL/min (by C-G formula based on SCr of 1.11 mg/dL (H)).    No Known Allergies  Antimicrobials this admission: vancomycin cefepime 1/26 >>  Ceftriaxone x1  >>   Dose adjustments this admission:   Microbiology results: 1/25 BCx: pending 125 UCx: pending     1/25 CXR: no active disease 1/25 UA: LE(+) NO2(-) WBC TNTC  Thank you for allowing pharmacy to be a part of this patient's care.  McBane,Matthew S 09/22/2016 2:51 AM  Addendum: Changed cefepime to 2 g IV q8h  Lenis Noon, PharmD 09/22/16 8:48 AM

## 2016-09-22 NOTE — Progress Notes (Signed)
Lovenox changed to BID for BMI >40 and CrCl >30.

## 2016-09-22 NOTE — ED Notes (Signed)
Pts gown, bed sheets and pads changed.  Pt placed in brief and repositioned in bed.  Pt given cup of water.

## 2016-09-22 NOTE — Progress Notes (Signed)
Inpatient Diabetes Program Recommendations  AACE/ADA: New Consensus Statement on Inpatient Glycemic Control (2015)  Target Ranges:  Prepandial:   less than 140 mg/dL      Peak postprandial:   less than 180 mg/dL (1-2 hours)      Critically ill patients:  140 - 180 mg/dL   Results for CEIL, BLAUER (MRN [id number]) as of 09/22/2016 11:17  Ref. Range 09/22/2016 07:56  Glucose-Capillary Latest Ref Range: 65 - 99 mg/dL 283 (H)   Results for DARRILYN, HORTIN (MRN [id number]) as of 09/22/2016 11:17  Ref. Range 09/21/2016 20:52 09/22/2016 08:14  Glucose Latest Ref Range: 65 - 99 mg/dL 263 (H) 272 (H)   Review of Glycemic Control  Diabetes history: DM2 Outpatient Diabetes medications: Lantus 300 units QHS, Humalog 23-25 units TID with meals, Metformin XR 1000 mg BID Current orders for Inpatient glycemic control: Novlog 0-9 units TID with meals, Novolog 0-5 units QHS  Inpatient Diabetes Program Recommendations:  Insulin - Basal: Please consider ordering Lantus 35 units Q24H starting now (based on 118 kg x 0.3 units) and continue to adjust accordingly as more data is collected on glucose trends. Correction (SSI): Please increase Novolog correction to Resistant scale. Insulin - Meal Coverage: Please consider ordering Novolog 5 units TID with meals for meal coverage.  Note: Spoke with patient over the phone about diabetes and home regimen for diabetes control. Patient reports that she is followed by PCP for diabetes management and she has an upcoming appointment for initial consult with Dr. Gabriel Carina (Endocrinologist) on 09/26/16.  Patient states that she is consistently taking Lantus 300 units QHS (3 injections of 100 units each), Metformin XR 1000 mg BID but admits that she does not consistently take the Humalog with meals.  Patient states that she checks her glucose 1-2 times per day and that it is usually in the 200's mg/dl or less for the most part for the past 3-4 weeks.  Patient reports that she did experience  a glucose of 52 mg/dl (woke up from sleep sweating) either last week or the first week of January but has not had a low glucose prior to that in nearly 2 years.  Discussed importance of checking CBGs and maintaining good CBG control to prevent long-term and short-term complications.  Stressed to the patient the importance of improving glycemic control to prevent further complications from uncontrolled diabetes. Discussed impact of nutrition, exercise, stress, sickness, and medications on diabetes control. Patient states that she has recently made dietary changes and she is not eating bread at all and she is trying to stay away from anything white (rice, sugar, potatoes). Patient admits that she does good with DM control for a while and then she gets off track. Patient reports that she recently went on a cruise and she walked a lot on the ship and she seen how much impact exercise has on DM control because her glucose was much better while she was on the cruise and walking.  Patient reports that her left leg and foot are red and hot to touch and she was concerned about it. Asked patient to be sure to let the attending doctor know about left leg and foot so it can be checked by MD.  Patient states that she is looking forward to seeing Dr. Gabriel Carina and that she is interested in using an insulin pump for DM control. Briefly discussed insulin pumps and continuous glucose monitoring. While talking with patient her glucose was checked and it is now up to  306 mg/dl. Patient expressed concern about her glucose being over 300 mg/dl. Informed patient that she has not been given any basal insulin yet which is likely why her glucose is more elevated. Informed patient recommendations for insulin would be made today.   Patient verbalized understanding of information discussed and she states that she has no further questions at this time related to diabetes.  Thanks, Barnie Alderman, RN, MSN, CDE Diabetes Coordinator Inpatient  Diabetes Program [phone] (Team Pager)

## 2016-09-22 NOTE — Progress Notes (Signed)
Critical Troponin of 0.05.  MD notified.  Awaiting on response.

## 2016-09-22 NOTE — Progress Notes (Signed)
Patient reports she checks blood sugar usually once a day at bedtime although she is supposed to check it with meals. Patient states she typically does not take her insulin with meals although that is how it is ordered.

## 2016-09-23 LAB — CBC
HEMATOCRIT: 37.2 % (ref 35.0–47.0)
HEMOGLOBIN: 12.6 g/dL (ref 12.0–16.0)
MCH: 29.9 pg (ref 26.0–34.0)
MCHC: 33.8 g/dL (ref 32.0–36.0)
MCV: 88.6 fL (ref 80.0–100.0)
Platelets: 216 10*3/uL (ref 150–440)
RBC: 4.2 MIL/uL (ref 3.80–5.20)
RDW: 13.9 % (ref 11.5–14.5)
WBC: 19.6 10*3/uL — AB (ref 3.6–11.0)

## 2016-09-23 LAB — BASIC METABOLIC PANEL
Anion gap: 6 (ref 5–15)
BUN: 38 mg/dL — AB (ref 6–20)
CALCIUM: 7.9 mg/dL — AB (ref 8.9–10.3)
CO2: 25 mmol/L (ref 22–32)
CREATININE: 1.58 mg/dL — AB (ref 0.44–1.00)
Chloride: 98 mmol/L — ABNORMAL LOW (ref 101–111)
GFR calc Af Amer: 38 mL/min — ABNORMAL LOW (ref >60)
GFR, EST NON AFRICAN AMERICAN: 33 mL/min — AB (ref >60)
GLUCOSE: 268 mg/dL — AB (ref 65–99)
POTASSIUM: 4.5 mmol/L (ref 3.5–5.1)
SODIUM: 129 mmol/L — AB (ref 135–145)

## 2016-09-23 LAB — GLUCOSE, CAPILLARY
GLUCOSE-CAPILLARY: 262 mg/dL — AB (ref 65–99)
Glucose-Capillary: 260 mg/dL — ABNORMAL HIGH (ref 65–99)
Glucose-Capillary: 337 mg/dL — ABNORMAL HIGH (ref 65–99)
Glucose-Capillary: 258 mg/dL — ABNORMAL HIGH (ref 65–99)

## 2016-09-23 LAB — HEMOGLOBIN A1C
HEMOGLOBIN A1C: 10.5 % — AB (ref 4.8–5.6)
MEAN PLASMA GLUCOSE: 255 mg/dL

## 2016-09-23 MED ORDER — OXYCODONE-ACETAMINOPHEN 5-325 MG PO TABS
1.0000 | ORAL_TABLET | ORAL | Status: DC | PRN
  Administered 2016-09-23 – 2016-09-24 (×4): 1 via ORAL
  Filled 2016-09-23 (×4): qty 1

## 2016-09-23 MED ORDER — SODIUM CHLORIDE 0.9 % IV SOLN
INTRAVENOUS | Status: DC
  Administered 2016-09-23 – 2016-09-24 (×2): via INTRAVENOUS

## 2016-09-23 MED ORDER — INSULIN ASPART 100 UNIT/ML ~~LOC~~ SOLN
5.0000 [IU] | Freq: Three times a day (TID) | SUBCUTANEOUS | Status: DC
  Administered 2016-09-23 – 2016-09-25 (×6): 5 [IU] via SUBCUTANEOUS
  Filled 2016-09-23 (×6): qty 5

## 2016-09-23 MED ORDER — POLYETHYLENE GLYCOL 3350 17 G PO PACK
17.0000 g | PACK | Freq: Every day | ORAL | Status: DC | PRN
  Administered 2016-09-24: 11:00:00 17 g via ORAL
  Filled 2016-09-23: qty 1

## 2016-09-23 MED ORDER — SENNOSIDES-DOCUSATE SODIUM 8.6-50 MG PO TABS
2.0000 | ORAL_TABLET | Freq: Every day | ORAL | Status: DC
  Administered 2016-09-23 – 2016-09-25 (×3): 2 via ORAL
  Filled 2016-09-23 (×3): qty 2

## 2016-09-23 MED ORDER — DEXTROSE 5 % IV SOLN
2.0000 g | Freq: Two times a day (BID) | INTRAVENOUS | Status: DC
  Administered 2016-09-23 – 2016-09-25 (×4): 2 g via INTRAVENOUS
  Filled 2016-09-23 (×7): qty 2

## 2016-09-23 NOTE — Plan of Care (Signed)
Problem: Physical Regulation: Goal: Will remain free from infection Outcome: Progressing Pt receiving IV antibiotics   

## 2016-09-23 NOTE — Evaluation (Signed)
Physical Therapy Evaluation Patient Details Name: Judith Shaw MRN: [id number] DOB: 08-26-1951 Today's Date: 09/23/2016   History of Present Illness  66 yo female with confusion, urinary incontinence and sepsis from UTI was admitted, has metabolic encephalopathy, recent return from [place] cruise.  PMHx:  COPD, DM, hypotension, HTN,    Clinical Impression  Pt was referred to PT after having a longer term issue of mobility, has decreased tolerance for gait at longer distances.  Her plan is to go home but will need family to assist her adequately, meaning safety with getting around and help with her household.  Pt is not worried but is definitely in need of some assistance and feels she will be fine with just herself.  Follow acutely for strengthening and gait endurance and training.    Follow Up Recommendations Home health PT;Supervision - Intermittent    Equipment Recommendations  Rolling walker with 5" wheels    Recommendations for Other Services       Precautions / Restrictions Precautions Precautions: Fall (telemetry) Restrictions Weight Bearing Restrictions: No      Mobility  Bed Mobility Overal bed mobility: Needs Assistance Bed Mobility: Supine to Sit;Sit to Supine     Supine to sit: Min assist Sit to supine: Min assist   General bed mobility comments: trunk assist coming out and legs assisted back to bed  Transfers Overall transfer level: Needs assistance Equipment used: Rolling walker (2 wheeled);1 person hand held assist Transfers: Sit to/from [facility] Sit to Stand: Min guard;Min assist Stand pivot transfers: Min guard          Ambulation/Gait Ambulation/Gait assistance: Min guard;Min assist Ambulation Distance (Feet): 150 Feet (50 + 100) Assistive device: Rolling walker (2 wheeled);1 person hand held assist Gait Pattern/deviations: Step-through pattern;Wide base of support;Trunk flexed;Decreased stride length (wide turns) Gait velocity:  reduced Gait velocity interpretation: Below normal speed for age/gender    Stairs            Wheelchair Mobility    Modified Rankin (Stroke Patients Only)       Balance Overall balance assessment: Needs assistance Sitting-balance support: Feet supported Sitting balance-Leahy Scale: Good   Postural control: Posterior lean Standing balance support: Bilateral upper extremity supported Standing balance-Leahy Scale: Fair                               Pertinent Vitals/Pain Pain Assessment: No/denies pain    Home Living Family/patient expects to be discharged to:: Private residence Living Arrangements: Alone Available Help at Discharge: Family;Available PRN/intermittently Type of Home: House Home Access: Stairs to enter Entrance Stairs-Rails: Right Entrance Stairs-Number of Steps: 1 Home Layout: One level;Other (Comment) (split level) Home Equipment: None      Prior Function Level of Independence: Independent         Comments: walked on cruise everywhere x onto and off the ship     Hand Dominance   Dominant Hand: Right    Extremity/Trunk Assessment   Upper Extremity Assessment Upper Extremity Assessment: Overall WFL for tasks assessed    Lower Extremity Assessment Lower Extremity Assessment: Overall WFL for tasks assessed    Cervical / Trunk Assessment Cervical / Trunk Assessment: Normal  Communication   Communication: No difficulties  Cognition Arousal/Alertness: Awake/alert Behavior During Therapy: WFL for tasks assessed/performed Overall Cognitive Status: Within Functional Limits for tasks assessed  General Comments      Exercises     Assessment/Plan    PT Assessment Patient needs continued PT services  PT Problem List Decreased strength;Decreased range of motion;Decreased activity tolerance;Decreased balance;Decreased mobility;Decreased coordination;Decreased knowledge of use of  DME;Cardiopulmonary status limiting activity;Obesity;Decreased skin integrity;Pain (L lower leg cellulitis)          PT Treatment Interventions DME instruction;Gait training;Stair training;Functional mobility training;Therapeutic activities;Therapeutic exercise;Balance training;Neuromuscular re-education;Patient/family education    PT Goals (Current goals can be found in the Care Plan section)  Acute Rehab PT Goals Patient Stated Goal: to manage LLE pain from cellulitis PT Goal Formulation: With patient Time For Goal Achievement: 10/07/16 Potential to Achieve Goals: Good    Frequency Min 2X/week   Barriers to discharge Decreased caregiver support home alone    Co-evaluation               End of Session Equipment Utilized During Treatment: Gait belt;Other (comment) (held O2) Activity Tolerance: Patient limited by fatigue Patient left: in bed;with call bell/phone within reach;with bed alarm set Nurse Communication: Mobility status         Time: 1125-1205 PT Time Calculation (min) (ACUTE ONLY): 40 min   Charges:   PT Evaluation $PT Eval Moderate Complexity: 1 Procedure PT Treatments $Gait Training: 8-22 mins $Therapeutic Activity: 8-22 mins   PT G Codes:        Ramond Dial Oct 17, 2016, 2:52 PM   Mee Hives, PT MS Acute Rehab Dept. Number: [place] and [place]

## 2016-09-23 NOTE — Progress Notes (Signed)
[facility] [place] NAME: Judith Shaw    MR#:  [id number]  DATE OF BIRTH:  23-May-1951  SUBJECTIVE:  CHIEF COMPLAINT:   Chief Complaint  Patient presents with  . Altered Mental Status    REVIEW OF SYSTEMS:  Review of Systems  Constitutional: Positive for malaise/fatigue. Negative for chills and fever.  HENT: Negative for congestion, hearing loss, nosebleeds and sinus pain.   Eyes: Negative for blurred vision and double vision.  Respiratory: Positive for shortness of breath. Negative for cough and wheezing.   Cardiovascular: Positive for leg swelling. Negative for chest pain and palpitations.  Gastrointestinal: Positive for constipation and nausea. Negative for abdominal pain, diarrhea and vomiting.  Genitourinary: Negative for dysuria.  Musculoskeletal: Negative for myalgias.  Neurological: Negative for dizziness, speech change, focal weakness, seizures and headaches.  Psychiatric/Behavioral: Negative for depression.    DRUG ALLERGIES:  No Known Allergies  VITALS:  Blood pressure (!) 110/38, pulse 87, temperature 99 F (37.2 C), temperature source Oral, resp. rate 18, height 5\' 5"  (1.651 m), weight 118.9 kg (262 lb 1.6 oz), SpO2 90 %.  PHYSICAL EXAMINATION:  Physical Exam  GENERAL:  66 y.o.-year-old obese patient lying in the bed with no acute distress.  EYES: Pupils equal, round, reactive to light and accommodation. No scleral icterus. Extraocular muscles intact.  HEENT: Head atraumatic, normocephalic. Oropharynx and nasopharynx clear.  NECK:  Supple, no jugular venous distention. No thyroid enlargement, no tenderness.  LUNGS: Normal breath sounds bilaterally, no wheezing, rales,rhonchi or crepitation. No use of accessory muscles of respiration.  CARDIOVASCULAR: S1, S2 normal. No murmurs, rubs, or gallops.  ABDOMEN: Soft, nontender, nondistended. Bowel sounds present. No organomegaly or mass.  EXTREMITIES: No cyanosis, or  clubbing. Left foot is swollen, erythematous and tender to touch NEUROLOGIC: Cranial nerves II through XII are intact. Muscle strength 5/5 in all extremities. Sensation intact. Gait not checked.  PSYCHIATRIC: The patient is alert and oriented x 3.  SKIN: No obvious rash, lesion, or ulcer.    LABORATORY PANEL:   CBC  Recent Labs Lab 09/23/16 0519  WBC 19.6*  HGB 12.6  HCT 37.2  PLT 216   ------------------------------------------------------------------------------------------------------------------  Chemistries   Recent Labs Lab 09/21/16 2052  09/23/16 0519  NA 132*  < > 129*  K 4.4  < > 4.5  CL 100*  < > 98*  CO2 21*  < > 25  GLUCOSE 263*  < > 268*  BUN 18  < > 38*  CREATININE 1.11*  < > 1.58*  CALCIUM 9.0  < > 7.9*  AST 26  --   --   ALT 19  --   --   ALKPHOS 80  --   --   BILITOT 0.8  --   --   < > = values in this interval not displayed. ------------------------------------------------------------------------------------------------------------------  Cardiac Enzymes  Recent Labs Lab 09/22/16 0814  TROPONINI 0.05*   ------------------------------------------------------------------------------------------------------------------  RADIOLOGY:  Dg Chest 1 View  Result Date: 09/21/2016 CLINICAL DATA:  66 y/o F; altered mental status, weakness, possible overdose. EXAM: CHEST 1 VIEW COMPARISON:  02/25/2014 CT of the chest. FINDINGS: The heart size and mediastinal contours are within normal limits and stable given projection and technique. Both lungs are clear. The visualized skeletal structures are unremarkable. IMPRESSION: No active disease. Electronically Signed   By: Kristine Garbe M.D.   On: 09/21/2016 21:45   Ct Head Wo Contrast  Result Date: 09/21/2016 CLINICAL DATA:  Acute  onset of altered mental status. Initial encounter. EXAM: CT HEAD WITHOUT CONTRAST TECHNIQUE: Contiguous axial images were obtained from the base of the skull through the  vertex without intravenous contrast. COMPARISON:  CT of the head performed 09/29/2009 FINDINGS: Brain: No evidence of acute infarction, hemorrhage, hydrocephalus, extra-axial collection or mass lesion/mass effect. Prominence of the ventricles and sulci reflects mild cortical volume loss. Mild cerebellar atrophy is noted. Mild periventricular white matter change likely reflects small vessel ischemic microangiopathy. The brainstem and fourth ventricle are within normal limits. The basal ganglia are unremarkable in appearance. The cerebral hemispheres demonstrate grossly normal gray-white differentiation. No mass effect or midline shift is seen. Vascular: No hyperdense vessel or unexpected calcification. Skull: There is no evidence of fracture; visualized osseous structures are unremarkable in appearance. Sinuses/Orbits: The visualized portions of the orbits are within normal limits. The paranasal sinuses and mastoid air cells are well-aerated. Other: No significant soft tissue abnormalities are seen. IMPRESSION: 1. No acute intracranial pathology seen on CT. 2. Mild cortical volume loss and scattered small vessel ischemic microangiopathy. Electronically Signed   By: Garald Balding M.D.   On: 09/21/2016 21:22   Ct Chest W Contrast  Result Date: 09/21/2016 CLINICAL DATA:  Acute onset of cough, fever and night sweats. Shortness of breath. Initial encounter. EXAM: CT CHEST WITH CONTRAST TECHNIQUE: Multidetector CT imaging of the chest was performed during intravenous contrast administration. CONTRAST:  31mL ISOVUE-300 IOPAMIDOL (ISOVUE-300) INJECTION 61% COMPARISON:  Chest radiograph performed earlier today at 9:18 p.m., and CTA of the chest performed 02/25/2014 FINDINGS: Cardiovascular: The heart is borderline enlarged. The thoracic aorta is unremarkable. The great vessels are unremarkable in appearance. No calcific atherosclerotic disease is seen. Mediastinum/Nodes: The mediastinum is unremarkable in appearance. No  mediastinal lymphadenopathy is seen. No pericardial effusion is identified. The thyroid gland is unremarkable. No axillary lymphadenopathy is seen. Lungs/Pleura: Mild patchy bibasilar airspace opacities may reflect atelectasis or possibly mild interstitial edema. The lungs are otherwise clear. No pleural effusion or pneumothorax is seen. No masses are identified. Upper Abdomen: The visualized portions of the liver and spleen are grossly unremarkable. The patient is status post cholecystectomy, with clips noted at the gallbladder fossa. The visualized portions of the pancreas, adrenal glands and kidneys are unremarkable, aside from mild left renal scarring. Musculoskeletal: No acute osseous abnormalities are identified. The visualized musculature is unremarkable in appearance. IMPRESSION: 1. Mild patchy bibasilar airspace opacities may reflect atelectasis or possibly mild interstitial edema. 2. Borderline cardiomegaly. 3. Mild left renal scarring. Electronically Signed   By: Garald Balding M.D.   On: 09/21/2016 23:34   US Venous Img Lower Unilateral Left  Result Date: 09/22/2016 CLINICAL DATA:  Left leg swelling for 6 weeks EXAM: LEFT LOWER EXTREMITY VENOUS DUPLEX ULTRASOUND TECHNIQUE: Doppler venous assessment of the left lower extremity deep venous system was performed, including characterization of spectral flow, compressibility, and phasicity. COMPARISON:  None. FINDINGS: There is complete compressibility of the left common femoral, femoral, and popliteal veins. Doppler analysis demonstrates respiratory phasicity and augmentation of flow with calf compression. No obvious superficial vein or calf vein thrombosis. IMPRESSION: No evidence of left lower extremity DVT. Electronically Signed   By: Marybelle Killings M.D.   On: 09/22/2016 16:01    EKG:   Orders placed or performed during the hospital encounter of 09/21/16  . EKG 12-Lead  . EKG 12-Lead    ASSESSMENT AND PLAN:   66 year old female with  insulin-dependent diabetes mellitus, hypertension, congestive heart failure presents to hospital secondary to  confusion and noted to have sepsis from UTI.  #1 sepsis- secondary to urinary tract infection. Urine cultures growing gram-negative rods. -Continue cefepime -Still has left lower extremity cellulitis. Dopplers negative for DVT. -Keep left leg elevated -WBC is still elevated. Continue antibiotics and monitor  #2 metabolic encephalopathy-secondary to sepsis, clearing up. Much improved and back to baseline.  #3 insulin-dependent diabetes mellitus-appreciate diabetes coronary care input. Lantus adjusted. Also on sliding scale insulin. Monitor sugars  #4 hypotension-hold Coreg, sodium is also on the lower side. Gentle hydration today  #5 constipation-medications adjusted.  #6 COPD-requiring 2 L oxygen here here. Also has sleep apnea and due for a sleep study and outpatient CPAP. -Wean oxygen as tolerated. And encourage incentive spirometry.  #7 DVT prophylaxis-on Lovenox  Physical therapy consulted.   All the records are reviewed and case discussed with Care Management/Social Workerr. Management plans discussed with the patient, family and they are in agreement.  CODE STATUS: Full Code  TOTAL TIME TAKING CARE OF THIS PATIENT: 38 minutes.   POSSIBLE D/C IN 1-2 DAYS, DEPENDING ON CLINICAL CONDITION.   Gladstone Lighter M.D on 09/23/2016 at 8:48 AM  Between 7am to 6pm - Pager - [phone]  After 6pm go to [email or web address] - password [email or web address] [facility]  Office  [phone]  CC: Primary care physician; [facility]

## 2016-09-24 LAB — CBC
HCT: 35.8 % (ref 35.0–47.0)
Hemoglobin: 12.2 g/dL (ref 12.0–16.0)
MCH: 30.2 pg (ref 26.0–34.0)
MCHC: 34.1 g/dL (ref 32.0–36.0)
MCV: 88.6 fL (ref 80.0–100.0)
PLATELETS: 261 10*3/uL (ref 150–440)
RBC: 4.04 MIL/uL (ref 3.80–5.20)
RDW: 13.9 % (ref 11.5–14.5)
WBC: 14.2 10*3/uL — ABNORMAL HIGH (ref 3.6–11.0)

## 2016-09-24 LAB — GLUCOSE, CAPILLARY
GLUCOSE-CAPILLARY: 242 mg/dL — AB (ref 65–99)
GLUCOSE-CAPILLARY: 289 mg/dL — AB (ref 65–99)
Glucose-Capillary: 241 mg/dL — ABNORMAL HIGH (ref 65–99)
Glucose-Capillary: 264 mg/dL — ABNORMAL HIGH (ref 65–99)

## 2016-09-24 LAB — BASIC METABOLIC PANEL
Anion gap: 7 (ref 5–15)
BUN: 33 mg/dL — ABNORMAL HIGH (ref 6–20)
CALCIUM: 8.4 mg/dL — AB (ref 8.9–10.3)
CO2: 25 mmol/L (ref 22–32)
CREATININE: 1.02 mg/dL — AB (ref 0.44–1.00)
Chloride: 103 mmol/L (ref 101–111)
GFR calc non Af Amer: 56 mL/min — ABNORMAL LOW (ref >60)
Glucose, Bld: 250 mg/dL — ABNORMAL HIGH (ref 65–99)
Potassium: 4.4 mmol/L (ref 3.5–5.1)
SODIUM: 135 mmol/L (ref 135–145)

## 2016-09-24 LAB — URINE CULTURE: Culture: >=100000 — AB

## 2016-09-24 MED ORDER — BISACODYL 5 MG PO TBEC
10.0000 mg | DELAYED_RELEASE_TABLET | Freq: Once | ORAL | Status: AC
  Administered 2016-09-24: 11:00:00 10 mg via ORAL
  Filled 2016-09-24: qty 2

## 2016-09-24 MED ORDER — VANCOMYCIN HCL IN DEXTROSE 1-5 GM/200ML-% IV SOLN
1000.0000 mg | Freq: Once | INTRAVENOUS | Status: AC
  Administered 2016-09-24: 1000 mg via INTRAVENOUS
  Filled 2016-09-24: qty 200

## 2016-09-24 MED ORDER — INSULIN GLARGINE 100 UNIT/ML ~~LOC~~ SOLN
39.0000 [IU] | Freq: Every day | SUBCUTANEOUS | Status: DC
  Administered 2016-09-25: 39 [IU] via SUBCUTANEOUS
  Filled 2016-09-24 (×2): qty 0.39

## 2016-09-24 MED ORDER — MORPHINE SULFATE (PF) 2 MG/ML IV SOLN
2.0000 mg | INTRAVENOUS | Status: AC
  Administered 2016-09-24: 2 mg via INTRAVENOUS
  Filled 2016-09-24: qty 1

## 2016-09-24 NOTE — Progress Notes (Signed)
Physical Therapy Treatment Patient Details Name: Judith Shaw MRN: [id number] DOB: 19-Feb-1951 Today's Date: 09/24/2016    History of Present Illness 66 yo female with confusion, urinary incontinence and sepsis from UTI was admitted, has metabolic encephalopathy, recent return from ocean cruise.  PMHx:  COPD, DM, hypotension, HTN,      PT Comments    Pt in bed, ready for session.  Bed mobility without assist.  She was able to stand and ambulate 150' x 2 with walker and min guard.  Unsteady at times but able to self correct.  Initially hesitant to weight bear on LLE but with ambulation gait became smoother and more confident.  Pt with SOB limiting mobility.  Stated she has to rest frequently at home due to SOB and is always planning ahead and looking for a chair when ambulating in the community.  O2 sats taken and remained >97% with gait on room air.    Pt does not have a walker at home but will benefit from one upon discharge.  She may prefer a rollator walker with seat for rest breaks but did not try this session.     Follow Up Recommendations  Home health PT;Supervision - Intermittent     Equipment Recommendations  Rolling walker with 5" wheels    Recommendations for Other Services       Precautions / Restrictions Precautions Precautions: Fall Restrictions Weight Bearing Restrictions: No    Mobility  Bed Mobility Overal bed mobility: Modified Independent                Transfers Overall transfer level: Needs assistance Equipment used: Rolling walker (2 wheeled)   Sit to Stand: Supervision         General transfer comment: Verbal cues for hand placements  Ambulation/Gait Ambulation/Gait assistance: Min guard Ambulation Distance (Feet): 150 Feet Assistive device: Rolling walker (2 wheeled) Gait Pattern/deviations: Step-through pattern;Wide base of support;Trunk flexed;Decreased stride length     General Gait Details:  150' x 2 with SOB noted.  O2 sats  remained >97% P 88-95   Stairs            Wheelchair Mobility    Modified Rankin (Stroke Patients Only)       Balance Overall balance assessment: Needs assistance Sitting-balance support: Feet supported Sitting balance-Leahy Scale: Good     Standing balance support: Bilateral upper extremity supported Standing balance-Leahy Scale: Fair                      Cognition Arousal/Alertness: Awake/alert Behavior During Therapy: WFL for tasks assessed/performed Overall Cognitive Status: Within Functional Limits for tasks assessed                      Exercises      General Comments        Pertinent Vitals/Pain Pain Assessment: 0-10 Pain Score: 3  Pain Location: LLE  Pain Descriptors / Indicators: Sore    Home Living                      Prior Function            PT Goals (current goals can now be found in the care plan section) Progress towards PT goals: Progressing toward goals    Frequency    Min 2X/week      PT Plan      Co-evaluation  End of Session Equipment Utilized During Treatment: Gait belt Activity Tolerance: Patient limited by fatigue Patient left: Other (comment);with call bell/phone within reach     Time: 0918-0950 PT Time Calculation (min) (ACUTE ONLY): 32 min  Charges:  $Gait Training: 23-37 mins                    G Codes:      Chesley Noon 10-22-2016, 10:20 AM

## 2016-09-24 NOTE — Plan of Care (Signed)
Problem: Physical Regulation: Goal: Will remain free from infection Outcome: Progressing Pt receiving IV antibiotics  Problem: Activity: Goal: Risk for activity intolerance will decrease Outcome: Progressing Pt walked with PT today, also up to [facility] as needed.  Problem: Bowel/Gastric: Goal: Will not experience complications related to bowel motility Outcome: Not Progressing Pt reports feeling constipated

## 2016-09-24 NOTE — Progress Notes (Signed)
[facility] [place] NAME: Judith Shaw    MR#:  [id number]  DATE OF BIRTH:  [birth year]  SUBJECTIVE:  CHIEF COMPLAINT:   Chief Complaint  Patient presents with  . Altered Mental Status    REVIEW OF SYSTEMS:  Review of Systems  Constitutional: Positive for malaise/fatigue. Negative for chills and fever.  HENT: Negative for congestion, hearing loss, nosebleeds and sinus pain.   Eyes: Negative for blurred vision and double vision.  Respiratory: Positive for shortness of breath. Negative for cough and wheezing.   Cardiovascular: Positive for leg swelling. Negative for chest pain and palpitations.  Gastrointestinal: Positive for constipation and nausea. Negative for abdominal pain, diarrhea and vomiting.  Genitourinary: Negative for dysuria.  Musculoskeletal: Negative for myalgias.  Neurological: Negative for dizziness, speech change, focal weakness, seizures and headaches.  Psychiatric/Behavioral: Negative for depression.    DRUG ALLERGIES:  No Known Allergies  VITALS:  Blood pressure (!) 141/52, pulse 83, temperature 99.6 F (37.6 C), temperature source Oral, resp. rate 18, height 5\' 5"  (1.651 m), weight 118.9 kg (262 lb 1.6 oz), SpO2 94 %.  PHYSICAL EXAMINATION:  Physical Exam  GENERAL:  66 y.o.-year-old obese patient lying in the bed with no acute distress.  EYES: Pupils equal, round, reactive to light and accommodation. No scleral icterus. Extraocular muscles intact.  HEENT: Head atraumatic, normocephalic. Oropharynx and nasopharynx clear.  NECK:  Supple, no jugular venous distention. No thyroid enlargement, no tenderness.  LUNGS: Normal breath sounds bilaterally, no wheezing, rales,rhonchi or crepitation. No use of accessory muscles of respiration.  CARDIOVASCULAR: S1, S2 normal. No murmurs, rubs, or gallops.  ABDOMEN: Soft, nontender, nondistended. Bowel sounds present. No organomegaly or mass.  EXTREMITIES: No cyanosis, or  clubbing. Left foot is swollen, erythematous and tender to touch NEUROLOGIC: Cranial nerves II through XII are intact. Muscle strength 5/5 in all extremities. Sensation intact. Gait not checked.  PSYCHIATRIC: The patient is alert and oriented x 3.  SKIN: No obvious rash, lesion, or ulcer.    LABORATORY PANEL:   CBC  Recent Labs Lab 09/24/16 0444  WBC 14.2*  HGB 12.2  HCT 35.8  PLT 261   ------------------------------------------------------------------------------------------------------------------  Chemistries   Recent Labs Lab 09/21/16 2052  09/24/16 0444  NA 132*  < > 135  K 4.4  < > 4.4  CL 100*  < > 103  CO2 21*  < > 25  GLUCOSE 263*  < > 250*  BUN 18  < > 33*  CREATININE 1.11*  < > 1.02*  CALCIUM 9.0  < > 8.4*  AST 26  --   --   ALT 19  --   --   ALKPHOS 80  --   --   BILITOT 0.8  --   --   < > = values in this interval not displayed. ------------------------------------------------------------------------------------------------------------------  Cardiac Enzymes  Recent Labs Lab 09/22/16 0814  TROPONINI 0.05*   ------------------------------------------------------------------------------------------------------------------  RADIOLOGY:  US Venous Img Lower Unilateral Left  Result Date: 09/22/2016 CLINICAL DATA:  Left leg swelling for 6 weeks EXAM: LEFT LOWER EXTREMITY VENOUS DUPLEX ULTRASOUND TECHNIQUE: Doppler venous assessment of the left lower extremity deep venous system was performed, including characterization of spectral flow, compressibility, and phasicity. COMPARISON:  None. FINDINGS: There is complete compressibility of the left common femoral, femoral, and popliteal veins. Doppler analysis demonstrates respiratory phasicity and augmentation of flow with calf compression. No obvious superficial vein or calf vein thrombosis. IMPRESSION: No evidence of left  lower extremity DVT. Electronically Signed   By: Marybelle Killings M.D.   On: 09/22/2016 16:01      EKG:   Orders placed or performed during the hospital encounter of 09/21/16  . EKG 12-Lead  . EKG 12-Lead    ASSESSMENT AND PLAN:   66 year old female with insulin-dependent diabetes mellitus, hypertension, congestive heart failure presents to hospital secondary to confusion and noted to have sepsis from UTI.  #1 sepsis- secondary to urinary tract infection. Urine cultures growing  klebsiella. -on cefepime- change to oral tomorrow for her cellulitis -Still has left lower extremity cellulitis. Dopplers negative for DVT. -Keep left leg elevated -WBC is improving, but low grade fevers present. Give 1 dose of vancomycin today. Continue antibiotics and monitor  #2 metabolic encephalopathy-secondary to sepsis, cleared up. improved and back to baseline.  #3 insulin-dependent diabetes mellitus-appreciate diabetes coordinator input. Lantus adjusted. Also on sliding scale insulin. Monitor sugars  #4 hypotension-hold Coreg, sodium improved. D/c fluids today  #5 constipation-medications adjusted.  #6 COPD-required 2 L oxygen here- now weaned off. Also has sleep apnea and due for a sleep study and outpatient CPAP. - encourage incentive spirometry.  #7 DVT prophylaxis-on Lovenox  Physical therapy consulted.recommended home health   All the records are reviewed and case discussed with Care Management/Social Workerr. Management plans discussed with the patient, family and they are in agreement.  CODE STATUS: Full Code  TOTAL TIME TAKING CARE OF THIS PATIENT: 38 minutes.   POSSIBLE D/C TOMORROW, DEPENDING ON CLINICAL CONDITION.   Gladstone Lighter M.D on 09/24/2016 at 12:27 PM  Between 7am to 6pm - Pager - [phone]  After 6pm go to [email or web address] - password [email or web address] [facility]  Office  [phone]  CC: Primary care physician; [facility] Acute C

## 2016-09-25 LAB — GLUCOSE, CAPILLARY
Glucose-Capillary: 295 mg/dL — ABNORMAL HIGH (ref 65–99)
Glucose-Capillary: 320 mg/dL — ABNORMAL HIGH (ref 65–99)
Glucose-Capillary: 257 mg/dL — ABNORMAL HIGH (ref 65–99)

## 2016-09-25 LAB — CBC
HCT: 35.1 % (ref 35.0–47.0)
HEMOGLOBIN: 12 g/dL (ref 12.0–16.0)
MCH: 30.2 pg (ref 26.0–34.0)
MCHC: 34.1 g/dL (ref 32.0–36.0)
MCV: 88.5 fL (ref 80.0–100.0)
Platelets: 266 10*3/uL (ref 150–440)
RBC: 3.96 MIL/uL (ref 3.80–5.20)
RDW: 14.1 % (ref 11.5–14.5)
WBC: 11.6 10*3/uL — ABNORMAL HIGH (ref 3.6–11.0)

## 2016-09-25 MED ORDER — OXYCODONE-ACETAMINOPHEN 5-325 MG PO TABS: 1.0000 | ORAL_TABLET | Freq: Four times a day (QID) | ORAL | 0 refills | Status: DC | PRN

## 2016-09-25 MED ORDER — INSULIN GLARGINE 100 UNIT/ML ~~LOC~~ SOLN: 42.0000 [IU] | Freq: Every day | SUBCUTANEOUS | 11 refills | Status: DC

## 2016-09-25 MED ORDER — CEFUROXIME AXETIL 500 MG PO TABS: 500.0000 mg | ORAL_TABLET | Freq: Two times a day (BID) | ORAL | 0 refills | Status: DC

## 2016-09-25 NOTE — Care Management (Addendum)
Admitted to [facility] with the diagnosis of sepsis. Lives alone. Sister is Sindy Messing [phone]) Last seen Dr. Ouida Sills 6 months ago. No home Health. No skilled facility. No home oxygen. Prescriptions are filled at [facility], [place] care of all basic activities of daily living herself, drives. No equipment in the home. Daughter will transport,                                                                                                              Physical therapy evaluation completed. Recommends Home with Home Health/physical therapy. Recommends rolling walker in the home. Discussed these recommendations with Ms. Brumm at the bedside. Doesn't want home health at this time. Would like rolling walker. Floydene Flock, [facility] representative updated. Will bring rolling walker to the hospital     Called back to talk to Ms. Maskell about [employer]. Still unsure about needing Home Health. Will fax information to [facility]. Discussed that she could talk to them about services when they call her home.                                                                     Shelbie Ammons RN MSN CCM Care Management

## 2016-09-25 NOTE — Care Management Important Message (Signed)
Important Message  Patient Details  Name: Judith Shaw MRN: [id number] Date of Birth: Feb 15, 1951   Medicare Important Message Given:  Yes    Shelbie Ammons, RN 09/25/2016, 10:11 AM

## 2016-09-25 NOTE — Progress Notes (Signed)
Inpatient Diabetes Program Recommendations  AACE/ADA: New Consensus Statement on Inpatient Glycemic Control (2015)  Target Ranges:  Prepandial:   less than 140 mg/dL      Peak postprandial:   less than 180 mg/dL (1-2 hours)      Critically ill patients:  140 - 180 mg/dL   Lab Results  Component Value Date   GLUCAP 320 (H) 09/25/2016   HGBA1C 10.5 (H) 09/22/2016    Review of Glycemic Control:  Results for MAYAR, ARGUELLO (MRN [id number]) as of 09/25/2016 10:59  Ref. Range 09/24/2016 07:36 09/24/2016 11:29 09/24/2016 17:20 09/24/2016 20:41 09/25/2016 07:51  Glucose-Capillary Latest Ref Range: 65 - 99 mg/dL 242 (H) 289 (H) 241 (H) 264 (H) 320 (H)    Diabetes history: Type 2 diabetes Outpatient Diabetes medications: Lantus 300 units daily, Humalog 16 units tid with meals, Metformin XR Current orders for Inpatient glycemic control:  Lantus 39 units daily, Novolog 5 units tid with meals, Novolog sensitive tid with meals and HS  Inpatient Diabetes Program Recommendations:   Patient appears resistant to insulin based on home insulin regimen and blood sugars.  Please consider increasing Lantus to 50 units bid and increasing Novolog to 10 units tid with meals.  Also please increase Novolog correction to resistant tid with meals and HS.  Thanks, Adah Perl, RN, BC-ADM Inpatient Diabetes Coordinator Pager [phone] (8a-5p)

## 2016-09-25 NOTE — Progress Notes (Addendum)
Discussed discharge instructions and medications with pt and her daughter. IV removed. Ace bandage applied to left lower leg per Dr. Wyatt Portela order.  All questions addressed.  Medications stored in pharmacy returned to pt.  Pt transported home via car by her daughter.  Clarise Cruz, RN

## 2016-09-26 LAB — CULTURE, BLOOD (ROUTINE X 2)
CULTURE: NO GROWTH
Culture: NO GROWTH

## 2016-09-26 NOTE — Discharge Summary (Signed)
[place] [facility] [place] NAME: Judith Shaw    MR#:  [id number]  DATE OF BIRTH:  08-10-1951  DATE OF ADMISSION:  09/21/2016   ADMITTING PHYSICIAN: Lance Coon, MD  DATE OF DISCHARGE: 09/25/2016  4:02 PM  PRIMARY CARE PHYSICIAN: [facility]   ADMISSION DIAGNOSIS:   altered mental status  DISCHARGE DIAGNOSIS:   Principal Problem:   Sepsis ([place]) Active Problems:   UTI (urinary tract infection)   HTN (hypertension)   Diabetes ([place])   HLD (hyperlipidemia)   Depression   SECONDARY DIAGNOSIS:   Past Medical History:  Diagnosis Date  . CHF (congestive heart failure) ([place])   . Depression   . Diabetes mellitus without complication ([place])   . Hyperlipidemia   . Hypertension     HOSPITAL COURSE:   66 year old female with insulin-dependent diabetes mellitus, hypertension, congestive heart failure presents to hospital secondary to confusion and noted to have sepsis from UTI.  #1 sepsis- secondary to urinary tract infection. Urine cultures growing  klebsiella. -was on cefepime- changed to oral Ceftin at discharge for her cellulitis -has left lower extremity cellulitis. Dopplers negative for DVT. -Keep left leg elevated.also Ace wrap recommended. Compliance with her medications, diabetes control and keeping the leg elevated. -WBC is improved, no further fevers. Blood cultures have remained negative  #2 metabolic encephalopathy-secondary to sepsis, cleared up. improved and back to baseline.  #3 insulin-dependent diabetes mellitus-appreciate diabetes coordinator input. Lantus adjusted.  Reverse have much improved at the time of discharge. Patient has follow-up with crinology after discharge in 2 days. Recommended highly to keep up the follow-up appointment. -Also on metformin  #4 Hypertension-on Coreg and ramipril  #5 constipation-medications adjusted. Had a BM prior to discharge  #6 COPD-required 2 L oxygen here- now  weaned off. Also has sleep apnea and due for a sleep study and outpatient CPAP. - encourage incentive spirometry.   Physical therapy consulted.recommended home health. Being discharged today. Counseled about compliance and the importance of following up with her physicians   DISCHARGE CONDITIONS:   Guarded'  CONSULTS OBTAINED:   None  DRUG ALLERGIES:   No Known Allergies DISCHARGE MEDICATIONS:   Allergies as of 09/25/2016   No Known Allergies     Medication List    STOP taking these medications   potassium chloride 10 MEQ CR capsule Commonly known as:  MICRO-K     TAKE these medications   aspirin EC 81 MG tablet Take 81 mg by mouth daily.   carvedilol 3.125 MG tablet Commonly known as:  COREG Take 3.125 mg by mouth 2 (two) times daily with a meal.   cefUROXime 500 MG tablet Commonly known as:  CEFTIN Take 1 tablet (500 mg total) by mouth 2 (two) times daily with a meal. X 5 more days   clotrimazole-betamethasone cream Commonly known as:  LOTRISONE Apply 1 application topically 2 (two) times daily.   DULoxetine 30 MG capsule Commonly known as:  CYMBALTA Take 30 mg by mouth daily.   HUMALOG KWIKPEN 100 UNIT/ML KiwkPen Generic drug:  insulin lispro Inject 16 Units into the skin 3 (three) times daily with meals.   insulin glargine 100 UNIT/ML injection Commonly known as:  LANTUS Inject 0.42 mLs (42 Units total) into the skin daily. What changed:  how much to take   metFORMIN 500 MG 24 hr tablet Commonly known as:  GLUCOPHAGE-XR Take 1,000 mg by mouth 2 (two) times daily.   oxyCODONE-acetaminophen 5-325 MG tablet  Commonly known as:  PERCOCET/ROXICET Take 1 tablet by mouth every 6 (six) hours as needed for severe pain.   ramipril 5 MG capsule Commonly known as:  ALTACE Take 5 mg by mouth daily.   simvastatin 40 MG tablet Commonly known as:  ZOCOR Take 40 mg by mouth every evening.        DISCHARGE INSTRUCTIONS:   1. PCP f/u in 1 week 2.  Endocrinology f/u in 2 days  DIET:   Cardiac diet and Diabetic diet  ACTIVITY:   Activity as tolerated  OXYGEN:   Home Oxygen: No.  Oxygen Delivery: room air  DISCHARGE LOCATION:   home   If you experience worsening of your admission symptoms, develop shortness of breath, life threatening emergency, suicidal or homicidal thoughts you must seek medical attention immediately by calling 911 or calling your MD immediately  if symptoms less severe.  You Must read complete instructions/literature along with all the possible adverse reactions/side effects for all the Medicines you take and that have been prescribed to you. Take any new Medicines after you have completely understood and accpet all the possible adverse reactions/side effects.   Please note  You were cared for by a hospitalist during your hospital stay. If you have any questions about your discharge medications or the care you received while you were in the hospital after you are discharged, you can call the unit and asked to speak with the hospitalist on call if the hospitalist that took care of you is not available. Once you are discharged, your primary care physician will handle any further medical issues. Please note that NO REFILLS for any discharge medications will be authorized once you are discharged, as it is imperative that you return to your primary care physician (or establish a relationship with a primary care physician if you do not have one) for your aftercare needs so that they can reassess your need for medications and monitor your lab values.    On the day of Discharge:  VITAL SIGNS:   Blood pressure (!) 164/61, pulse 79, temperature 98 F (36.7 C), temperature source Oral, resp. rate 20, height 5\' 5"  (1.651 m), weight 118.9 kg (262 lb 1.6 oz), SpO2 100 %.  PHYSICAL EXAMINATION:    GENERAL:  66 y.o.-year-old obese patient lying in the bed with no acute distress.  EYES: Pupils equal, round, reactive to  light and accommodation. No scleral icterus. Extraocular muscles intact.  HEENT: Head atraumatic, normocephalic. Oropharynx and nasopharynx clear.  NECK:  Supple, no jugular venous distention. No thyroid enlargement, no tenderness.  LUNGS: Improved breath sounds bilaterally, no wheezing, rales,rhonchi or crepitation. No use of accessory muscles of respiration.  CARDIOVASCULAR: S1, S2 normal. No murmurs, rubs, or gallops.  ABDOMEN: Soft, nontender, nondistended. Bowel sounds present. No organomegaly or mass.  EXTREMITIES: No cyanosis, or clubbing. Left foot is swollen, erythematous and non tender to touch but improved since 2 days NEUROLOGIC: Cranial nerves II through XII are intact. Muscle strength 5/5 in all extremities. Sensation intact. Gait not checked.  PSYCHIATRIC: The patient is alert and oriented x 3.  SKIN: No obvious rash, lesion, or ulcer.    DATA REVIEW:   CBC  Recent Labs Lab 09/25/16 0428  WBC 11.6*  HGB 12.0  HCT 35.1  PLT 266    Chemistries   Recent Labs Lab 09/21/16 2052  09/24/16 0444  NA 132*  < > 135  K 4.4  < > 4.4  CL 100*  < > 103  CO2 21*  < > 25  GLUCOSE 263*  < > 250*  BUN 18  < > 33*  CREATININE 1.11*  < > 1.02*  CALCIUM 9.0  < > 8.4*  AST 26  --   --   ALT 19  --   --   ALKPHOS 80  --   --   BILITOT 0.8  --   --   < > = values in this interval not displayed.   Microbiology Results  Results for orders placed or performed during the hospital encounter of 09/21/16  Urine culture     Status: Abnormal   Collection Time: 09/21/16 10:40 PM  Result Value Ref Range Status   Specimen Description URINE, RANDOM  Final   Special Requests NONE  Final   Culture >=100,000 COLONIES/mL KLEBSIELLA PNEUMONIAE (A)  Final   Report Status 09/24/2016 FINAL  Final   Organism ID, Bacteria KLEBSIELLA PNEUMONIAE (A)  Final      Susceptibility   Klebsiella pneumoniae - MIC*    AMPICILLIN >=32 RESISTANT Resistant     CEFAZOLIN <=4 SENSITIVE Sensitive      CEFTRIAXONE <=1 SENSITIVE Sensitive     CIPROFLOXACIN <=0.25 SENSITIVE Sensitive     GENTAMICIN <=1 SENSITIVE Sensitive     IMIPENEM <=0.25 SENSITIVE Sensitive     NITROFURANTOIN 64 INTERMEDIATE Intermediate     TRIMETH/SULFA <=20 SENSITIVE Sensitive     AMPICILLIN/SULBACTAM 4 SENSITIVE Sensitive     PIP/TAZO <=4 SENSITIVE Sensitive     Extended ESBL NEGATIVE Sensitive     * >=100,000 COLONIES/mL KLEBSIELLA PNEUMONIAE  Blood culture (routine x 2)     Status: None   Collection Time: 09/21/16 10:52 PM  Result Value Ref Range Status   Specimen Description BLOOD LEFT AC  Final   Special Requests   Final    BOTTLES DRAWN AEROBIC AND ANAEROBIC AER10CC ANA11CC   Culture NO GROWTH 5 DAYS  Final   Report Status 09/26/2016 FINAL  Final  Blood culture (routine x 2)     Status: None   Collection Time: 09/21/16 11:23 PM  Result Value Ref Range Status   Specimen Description BLOOD LEFT HAND  Final   Special Requests BOTTLES DRAWN AEROBIC AND ANAEROBIC  [place]  Final   Culture NO GROWTH 5 DAYS  Final   Report Status 09/26/2016 FINAL  Final    RADIOLOGY:  No results found.   Management plans discussed with the patient, family and they are in agreement.  CODE STATUS:  Code Status History    Date Active Date Inactive Code Status Order ID Comments User Context   09/22/2016  2:18 AM 09/25/2016  7:08 PM Full Code [id number]  Lance Coon, MD Inpatient      TOTAL TIME TAKING CARE OF THIS PATIENT: 38 minutes.    Steffie Waggoner M.D on 09/26/2016 at 4:29 PM  Between 7am to 6pm - Pager - [phone]  After 6pm go to [email or web address] - Proofreader  [facility]  Office  [phone]  CC: Primary care physician; [facility]   Note: This dictation was prepared with Dragon dictation along with smaller phrase technology. Any transcriptional errors that result from this process are unintentional.

## 2016-10-03 ENCOUNTER — Encounter: Payer: Self-pay | Admitting: Emergency Medicine

## 2016-10-03 ENCOUNTER — Emergency Department: Payer: Medicare Other

## 2016-10-03 ENCOUNTER — Emergency Department
Admission: EM | Admit: 2016-10-03 | Discharge: 2016-10-03 | Disposition: A | Discharge status: Home / Self Care | Payer: Medicare Other | Attending: Emergency Medicine | Admitting: Emergency Medicine

## 2016-10-03 DIAGNOSIS — Z79899 Other long term (current) drug therapy: Secondary | ICD-10-CM | POA: Insufficient documentation

## 2016-10-03 DIAGNOSIS — W01110A Fall on same level from slipping, tripping and stumbling with subsequent striking against sharp glass, initial encounter: Secondary | ICD-10-CM | POA: Insufficient documentation

## 2016-10-03 DIAGNOSIS — Y939 Activity, unspecified: Secondary | ICD-10-CM | POA: Insufficient documentation

## 2016-10-03 DIAGNOSIS — Z794 Long term (current) use of insulin: Secondary | ICD-10-CM | POA: Insufficient documentation

## 2016-10-03 DIAGNOSIS — I110 Hypertensive heart disease with heart failure: Secondary | ICD-10-CM | POA: Insufficient documentation

## 2016-10-03 DIAGNOSIS — Z7982 Long term (current) use of aspirin: Secondary | ICD-10-CM | POA: Insufficient documentation

## 2016-10-03 DIAGNOSIS — Y999 Unspecified external cause status: Secondary | ICD-10-CM | POA: Insufficient documentation

## 2016-10-03 DIAGNOSIS — I509 Heart failure, unspecified: Secondary | ICD-10-CM | POA: Insufficient documentation

## 2016-10-03 DIAGNOSIS — N390 Urinary tract infection, site not specified: Secondary | ICD-10-CM

## 2016-10-03 DIAGNOSIS — E1165 Type 2 diabetes mellitus with hyperglycemia: Secondary | ICD-10-CM | POA: Insufficient documentation

## 2016-10-03 DIAGNOSIS — Y92009 Unspecified place in unspecified non-institutional (private) residence as the place of occurrence of the external cause: Secondary | ICD-10-CM | POA: Insufficient documentation

## 2016-10-03 DIAGNOSIS — R739 Hyperglycemia, unspecified: Secondary | ICD-10-CM

## 2016-10-03 DIAGNOSIS — S0990XA Unspecified injury of head, initial encounter: Secondary | ICD-10-CM | POA: Diagnosis not present

## 2016-10-03 LAB — URINALYSIS, COMPLETE (UACMP) WITH MICROSCOPIC
BACTERIA UA: NONE SEEN
BILIRUBIN URINE: NEGATIVE
Glucose, UA: >=500 mg/dL — AB
HGB URINE DIPSTICK: NEGATIVE
Ketones, ur: NEGATIVE mg/dL
LEUKOCYTES UA: NEGATIVE
NITRITE: NEGATIVE
Protein, ur: 100 mg/dL — AB
RBC / HPF: NONE SEEN RBC/hpf (ref 0–5)
SPECIFIC GRAVITY, URINE: 1.014 (ref 1.005–1.030)
pH: 5 (ref 5.0–8.0)

## 2016-10-03 LAB — BASIC METABOLIC PANEL
ANION GAP: 10 (ref 5–15)
BUN: 20 mg/dL (ref 6–20)
CHLORIDE: 96 mmol/L — AB (ref 101–111)
CO2: 27 mmol/L (ref 22–32)
Calcium: 9.1 mg/dL (ref 8.9–10.3)
Creatinine, Ser: 0.86 mg/dL (ref 0.44–1.00)
GFR calc non Af Amer: >60 mL/min (ref >60)
GLUCOSE: 331 mg/dL — AB (ref 65–99)
POTASSIUM: 4.3 mmol/L (ref 3.5–5.1)
Sodium: 133 mmol/L — ABNORMAL LOW (ref 135–145)

## 2016-10-03 LAB — CBC
HEMATOCRIT: 39.1 % (ref 35.0–47.0)
Hemoglobin: 13.1 g/dL (ref 12.0–16.0)
MCH: 29.9 pg (ref 26.0–34.0)
MCHC: 33.5 g/dL (ref 32.0–36.0)
MCV: 89 fL (ref 80.0–100.0)
Platelets: 361 10*3/uL (ref 150–440)
RBC: 4.39 MIL/uL (ref 3.80–5.20)
RDW: 13.8 % (ref 11.5–14.5)
WBC: 11.7 10*3/uL — AB (ref 3.6–11.0)

## 2016-10-03 IMAGING — CT CT HEAD W/O CM
3 series · 16 of 46 positions shown, 19 images · non-contrast
Comparison: [DATE]

CLINICAL DATA: Fall striking head on collapse. Scalp hematoma.
Recent admission for cellulitis. Hyperglycemia.

EXAM:
CT HEAD WITHOUT CONTRAST
TECHNIQUE: Contiguous axial images were obtained from the base of the skull
through the vertex without intravenous contrast.

[Series 2: head wo · axial · 0.38mm/px · z∈[-94,+26]mm · 10 of 29 slices shown, 13 images]
[im 3/29  brain]
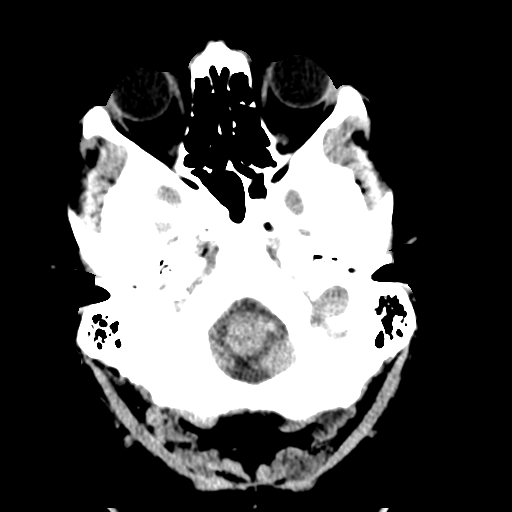
[im 3/29  bone]
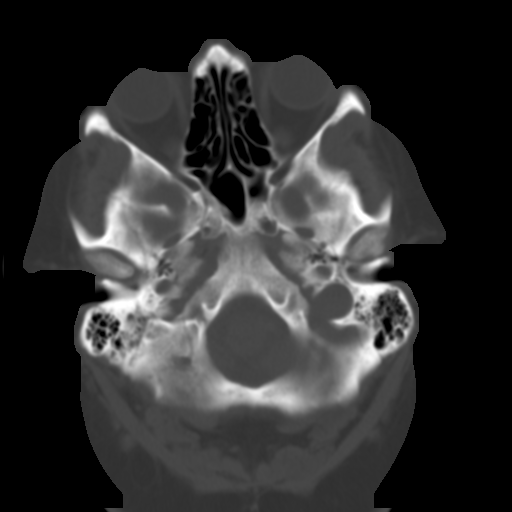
[im 6/29  brain]
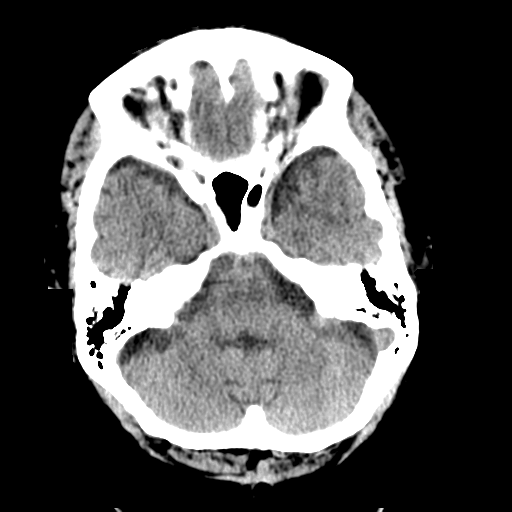
[im 8/29  brain]
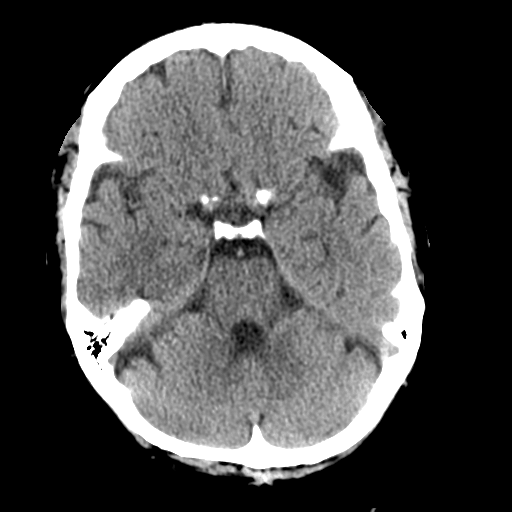
[im 11/29  brain]
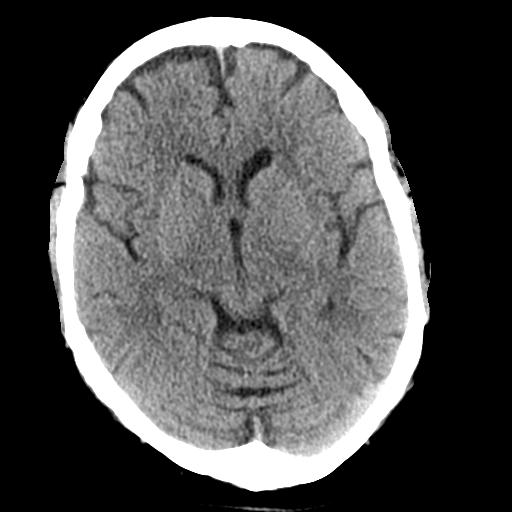
[im 14/29  brain]
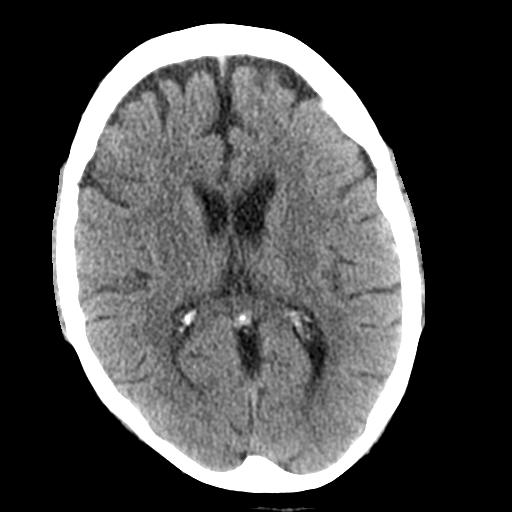
[im 14/29  bone]
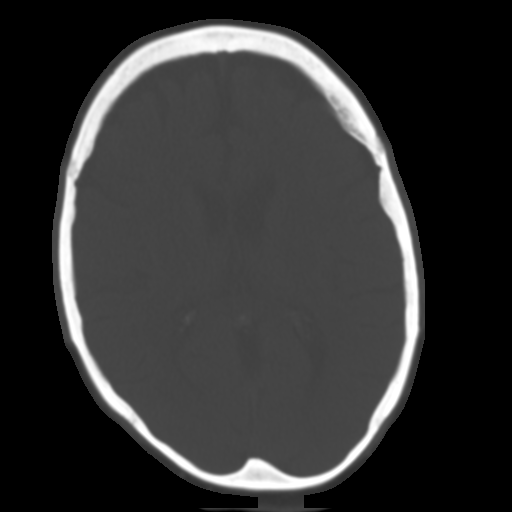
[im 16/29  brain]
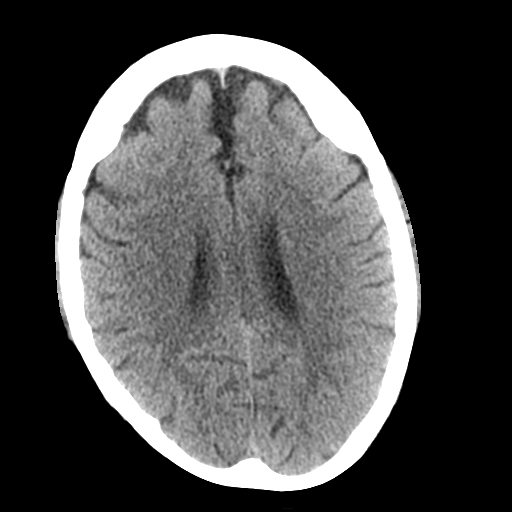
[im 19/29  brain]
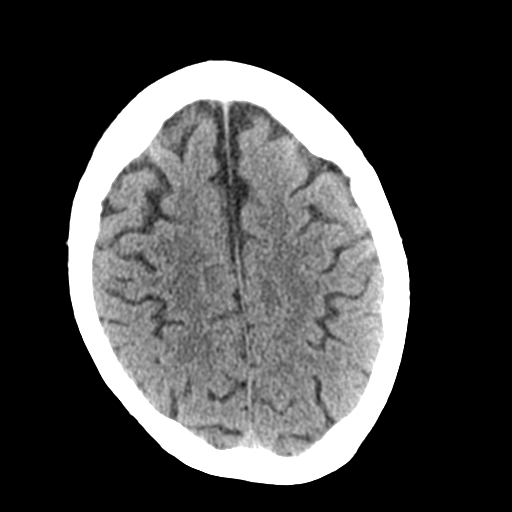
[im 22/29  brain]
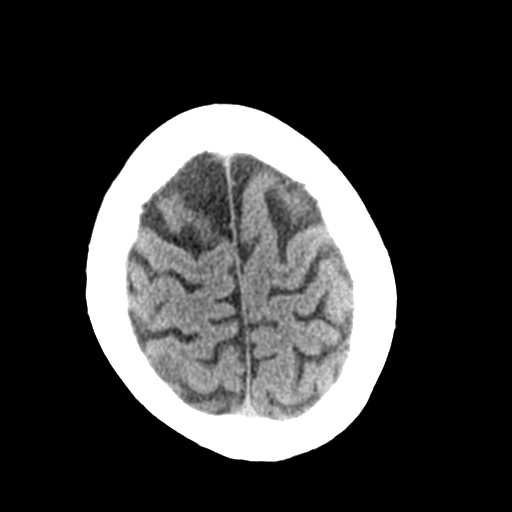
[im 24/29  brain]
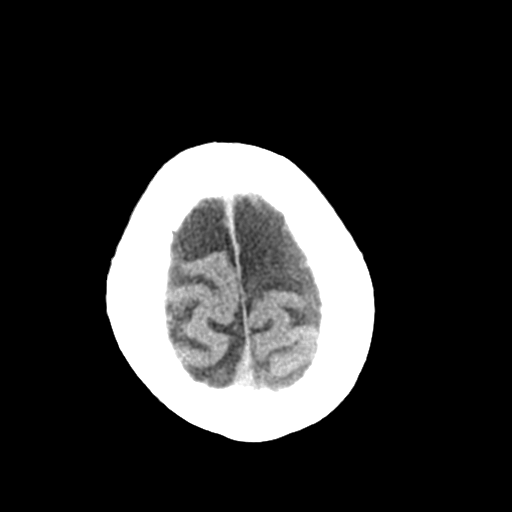
[im 24/29  bone]
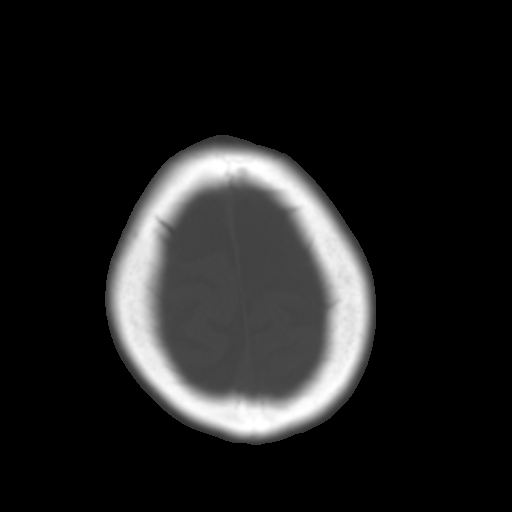
[im 27/29  brain]
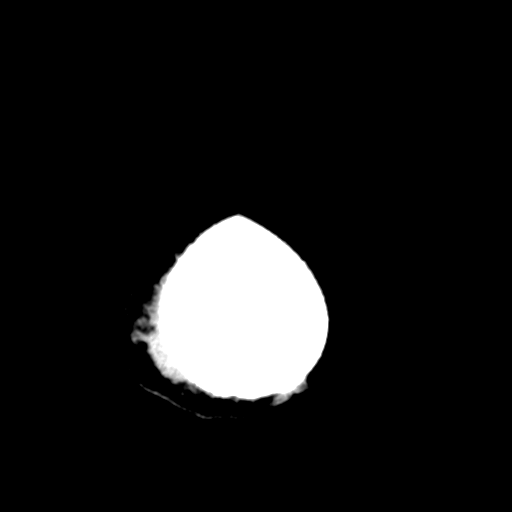

[Series 4: coronal soft tissue · coronal · 0.35mm/px · 3 of 64 slices shown]
[im 22/64  brain]
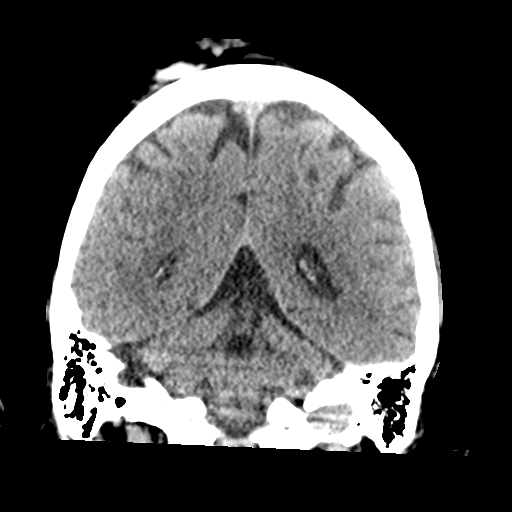
[im 29/64  brain]
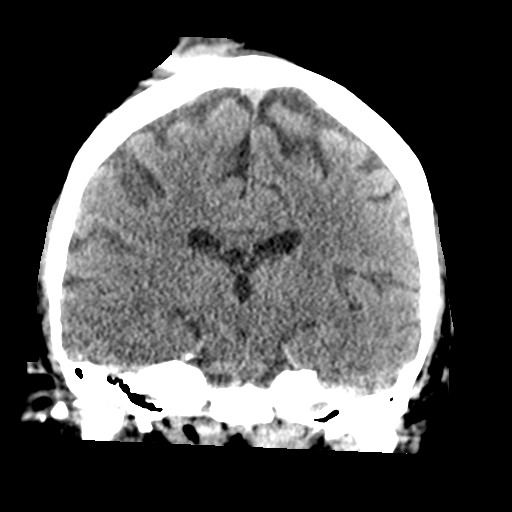
[im 36/64  brain]
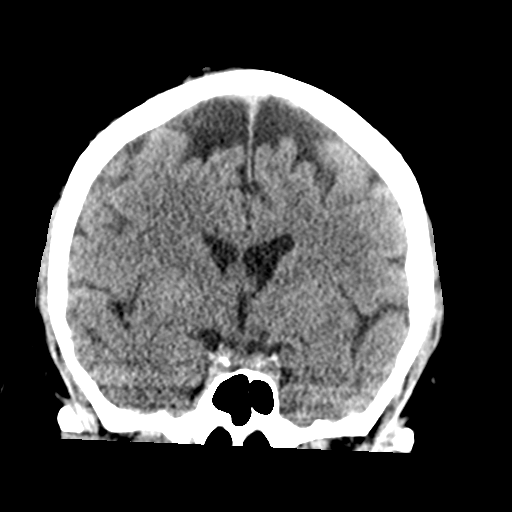

[Series 5: sagittal soft tissue · sagittal · 0.33mm/px · 3 of 52 slices shown]
[im 18/52  brain]
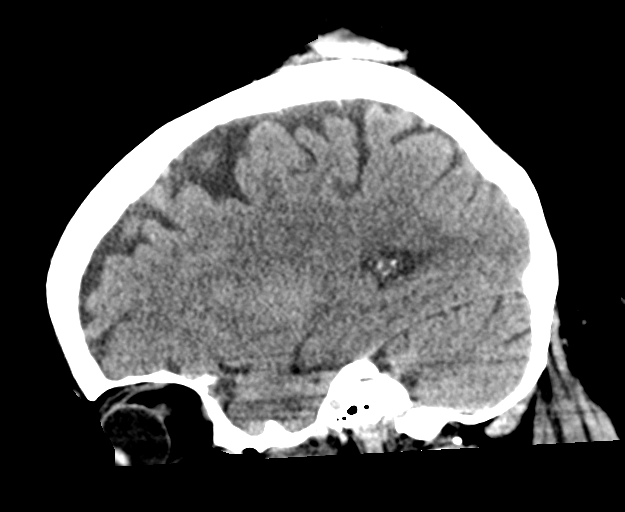
[im 26/52  brain]
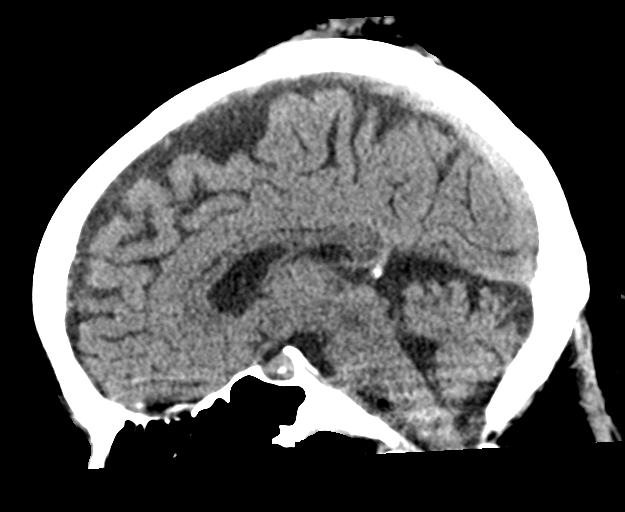
[im 35/52  brain]
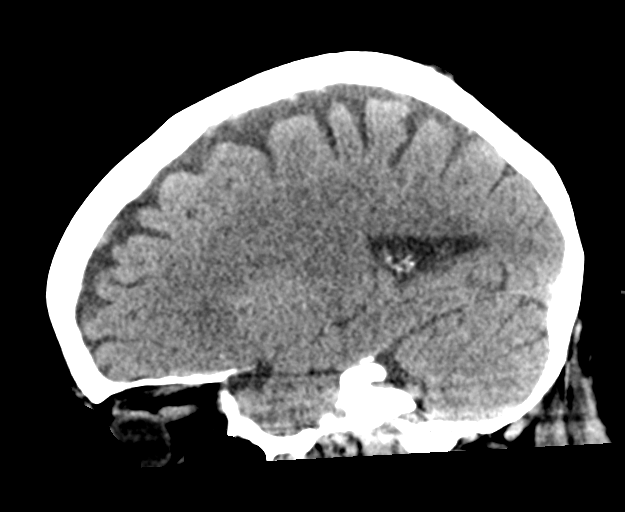

[16 of 46 positions shown; findings below may reference images not displayed]

FINDINGS: Brain: The brainstem, cerebellum, cerebral peduncles, thalami, basal
ganglia, basilar cisterns, and ventricular system appear within
normal limits. No intracranial hemorrhage, mass lesion, or acute
CVA.

Vascular: Unremarkable

Skull: Unremarkable

Sinuses/Orbits: Unremarkable

Other: Scalp hematoma along the right vertex.
IMPRESSION: 1. No acute intracranial findings.
2. Scalp hematoma along the right vertex.

## 2016-10-03 MED ORDER — OXYCODONE-ACETAMINOPHEN 5-325 MG PO TABS
2.0000 | ORAL_TABLET | Freq: Once | ORAL | Status: AC
  Administered 2016-10-03: 2 via ORAL
  Filled 2016-10-03: qty 2

## 2016-10-03 MED ORDER — OXYCODONE-ACETAMINOPHEN 5-325 MG PO TABS: 2.0000 | ORAL_TABLET | Freq: Four times a day (QID) | ORAL | 0 refills | Status: DC | PRN

## 2016-10-03 MED ORDER — CEPHALEXIN 250 MG PO CAPS: 250.0000 mg | ORAL_CAPSULE | Freq: Four times a day (QID) | ORAL | 0 refills | Status: AC

## 2016-10-03 MED ORDER — SODIUM CHLORIDE 0.9 % IV SOLN
Freq: Once | INTRAVENOUS | Status: AC
  Administered 2016-10-03: 1000 mL via INTRAVENOUS

## 2016-10-03 NOTE — ED Notes (Signed)
Patient transported to CT 

## 2016-10-03 NOTE — ED Provider Notes (Signed)
[facility] Emergency Department Provider Note        Time seen: ----------------------------------------- 7:20 AM on 10/03/2016 -----------------------------------------    I have reviewed the triage vital signs and the nursing notes.   HISTORY  Chief Complaint Fall    HPI Judith Shaw is a 66 y.o. female who presents to ER if she tripped and fell at home. She arrives via EMS after she hit her head and knocked a glass pane out of the window. Patient states she was walking in the dark to turn out a lamp. She states she broke the glass but it did not cut her. She did have a large hematoma develop on the back for head. She was admitted for cellulitis and UTI recently and discharged about a week ago. She still in considerable pain in her legs from neuropathy. Blood sugar was elevated in route by EMS.   Past Medical History:  Diagnosis Date  . CHF (congestive heart failure) ([place])   . Depression   . Diabetes mellitus without complication ([place])   . Hyperlipidemia   . Hypertension     Patient Active Problem List   Diagnosis Date Noted  . Sepsis ([place]) 09/21/2016  . UTI (urinary tract infection) 09/21/2016  . HTN (hypertension) 09/21/2016  . Diabetes ([place]) 09/21/2016  . HLD (hyperlipidemia) 09/21/2016  . Depression 09/21/2016    Past Surgical History:  Procedure Laterality Date  . ABDOMINAL HYSTERECTOMY    . CHOLECYSTECTOMY      Allergies Patient has no known allergies.  Social History Social History  Substance Use Topics  . Smoking status: Never Smoker  . Smokeless tobacco: Never Used  . Alcohol use No    Review of Systems Constitutional: Negative for fever. Cardiovascular: Negative for chest pain. Respiratory: Negative for shortness of breath. Gastrointestinal: Negative for abdominal pain, vomiting and diarrhea. Genitourinary: Negative for dysuria. Musculoskeletal: Positive for leg pain bilaterally Skin: Positive for  hematoma Neurological:Positive for headache  10-point ROS otherwise negative.  ____________________________________________   PHYSICAL EXAM:  VITAL SIGNS: ED Triage Vitals  Enc Vitals Group     BP 10/03/16 0600 (!) 152/71     Pulse Rate 10/03/16 0600 100     Resp 10/03/16 0600 18     Temp 10/03/16 0600 98 F (36.7 C)     Temp Source 10/03/16 0600 Oral     SpO2 10/03/16 0600 95 %     Weight 10/03/16 0601 262 lb (118.8 kg)     Height 10/03/16 0601 5\' 5"  (1.651 m)     Head Circumference --      Peak Flow --      Pain Score 10/03/16 0601 7     Pain Loc --      Pain Edu? --      Excl. in [place]? --     Constitutional: Alert and oriented. Well appearing and in no distress. Eyes: Conjunctivae are normal. PERRL. Normal extraocular movements. ENT   Head: Normocephalic, large midline parietal scalp hematoma   Nose: No congestion/rhinnorhea.   Mouth/Throat: Mucous membranes are moist.   Neck: No stridor. Cardiovascular: Normal rate, regular rhythm. No murmurs, rubs, or gallops. Respiratory: Normal respiratory effort without tachypnea nor retractions. Breath sounds are clear and equal bilaterally. No wheezes/rales/rhonchi. Gastrointestinal: Soft and nontender. Normal bowel sounds Musculoskeletal: Nontender with normal range of motion in all extremities. No lower extremity tenderness nor edema. Neurologic:  Normal speech and language. No gross focal neurologic deficits are appreciated.  Skin:  Skin is  warm, dry and intact. Peeling skin is appreciated in the left lower extremity particularly. Psychiatric: Mood and affect are normal. Speech and behavior are normal.  ____________________________________________  ED COURSE:  Pertinent labs & imaging results that were available during my care of the patient were reviewed by me and considered in my medical decision making (see chart for details). Patient presents to the ER no distress after a fall. She didn't sustain significant  head trauma. We will assess with imaging, check basic labs.   Procedures ____________________________________________   LABS (pertinent positives/negatives)  Labs Reviewed  CBC - Abnormal; Notable for the following:       Result Value   WBC 11.7 (*)    All other components within normal limits  BASIC METABOLIC PANEL - Abnormal; Notable for the following:    Sodium 133 (*)    Chloride 96 (*)    Glucose, Bld 331 (*)    All other components within normal limits  URINALYSIS, COMPLETE (UACMP) WITH MICROSCOPIC - Abnormal; Notable for the following:    Color, Urine YELLOW (*)    APPearance CLEAR (*)    Glucose, UA >=500 (*)    Protein, ur 100 (*)    Squamous Epithelial / LPF 0-5 (*)    All other components within normal limits    RADIOLOGY Images were viewed by me  CT head IMPRESSION: 1. No acute intracranial findings. 2. Scalp hematoma along the right vertex. ____________________________________________  FINAL ASSESSMENT AND PLAN  Head injury, hyperglycemia, UTI  Plan: Patient with labs and imaging as dictated above. Patient is in no acute distress and was started back on Keflex for UTI. CT scan was unremarkable. She is going to see her primary doctor in one hour for follow-up. This set artery been scheduled. She will discuss her hyperglycemia with him. She also needs pain medication for neuropathy. She is stable for outpatient follow-up.   Earleen Newport, MD   Note: This note was generated in part or whole with voice recognition software. Voice recognition is usually quite accurate but there are transcription errors that can and very often do occur. I apologize for any typographical errors that were not detected and corrected.     Earleen Newport, MD 10/03/16 [phone]

## 2016-10-03 NOTE — ED Triage Notes (Addendum)
Pt coming from home via EMS fell and tripped and hit head into lower glass pain while walking in dark without walker. She broke glass but did not cut patient. Patient has hematoma on top of head. Pt denies loc. Patient was admitted for cellulitis just recently and was just discharged recently and is still in a lot of pain. Patient's was blood sugar was 392 via EMS.

## 2017-01-09 ENCOUNTER — Emergency Department
Admission: EM | Admit: 2017-01-09 | Discharge: 2017-01-09 | Disposition: A | Discharge status: Home / Self Care | Payer: Medicare Other | Attending: Emergency Medicine | Admitting: Emergency Medicine

## 2017-01-09 ENCOUNTER — Emergency Department: Payer: Medicare Other

## 2017-01-09 DIAGNOSIS — Y929 Unspecified place or not applicable: Secondary | ICD-10-CM | POA: Insufficient documentation

## 2017-01-09 DIAGNOSIS — E119 Type 2 diabetes mellitus without complications: Secondary | ICD-10-CM | POA: Diagnosis not present

## 2017-01-09 DIAGNOSIS — Y9301 Activity, walking, marching and hiking: Secondary | ICD-10-CM | POA: Insufficient documentation

## 2017-01-09 DIAGNOSIS — S0990XA Unspecified injury of head, initial encounter: Secondary | ICD-10-CM

## 2017-01-09 DIAGNOSIS — Z7984 Long term (current) use of oral hypoglycemic drugs: Secondary | ICD-10-CM | POA: Diagnosis not present

## 2017-01-09 DIAGNOSIS — Y998 Other external cause status: Secondary | ICD-10-CM | POA: Insufficient documentation

## 2017-01-09 DIAGNOSIS — W01190A Fall on same level from slipping, tripping and stumbling with subsequent striking against furniture, initial encounter: Secondary | ICD-10-CM | POA: Diagnosis not present

## 2017-01-09 DIAGNOSIS — Z794 Long term (current) use of insulin: Secondary | ICD-10-CM | POA: Diagnosis not present

## 2017-01-09 DIAGNOSIS — R51 Headache: Secondary | ICD-10-CM | POA: Diagnosis present

## 2017-01-09 DIAGNOSIS — I509 Heart failure, unspecified: Secondary | ICD-10-CM | POA: Insufficient documentation

## 2017-01-09 DIAGNOSIS — Z7982 Long term (current) use of aspirin: Secondary | ICD-10-CM | POA: Insufficient documentation

## 2017-01-09 DIAGNOSIS — I110 Hypertensive heart disease with heart failure: Secondary | ICD-10-CM | POA: Diagnosis not present

## 2017-01-09 IMAGING — CT CT HEAD W/O CM
3 series · 15 of 47 positions shown, 18 images · non-contrast
Comparison: [DATE]

CLINICAL DATA: Headache since mid [place].

EXAM:
CT HEAD WITHOUT CONTRAST
TECHNIQUE: Contiguous axial images were obtained from the base of the skull
through the vertex without intravenous contrast.

[Series 2: head wo · axial · 0.41mm/px · z∈[+136,+261]mm · 9 of 30 slices shown, 12 images]
[im 3/30  brain]
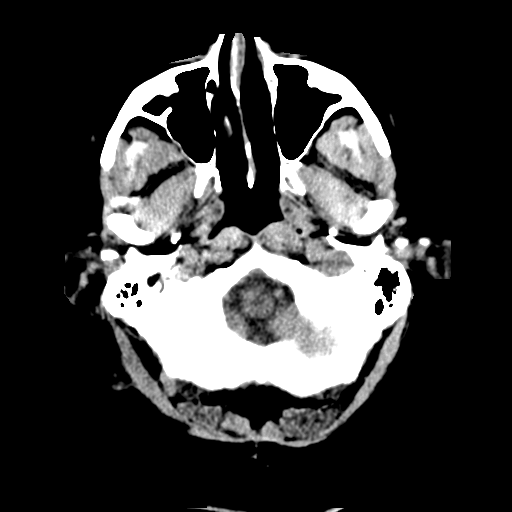
[im 3/30  bone]
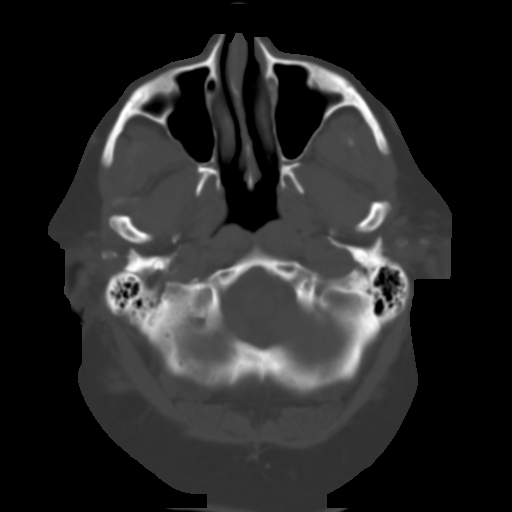
[im 6/30  brain]
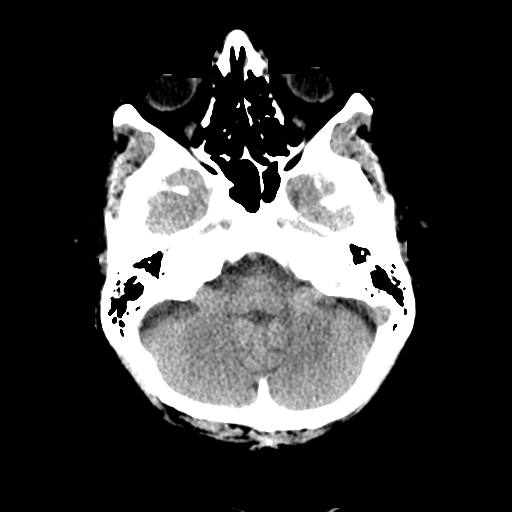
[im 9/30  brain]
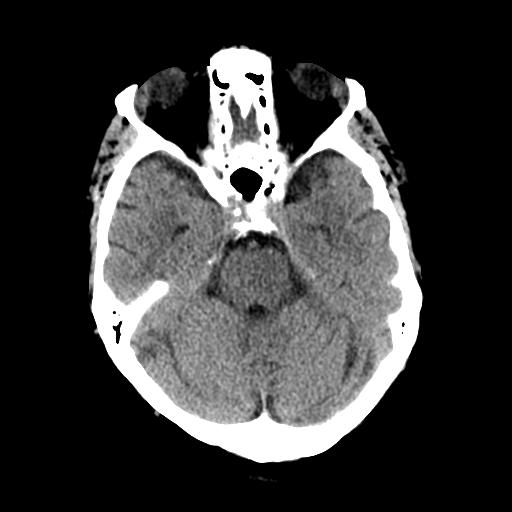
[im 12/30  brain]
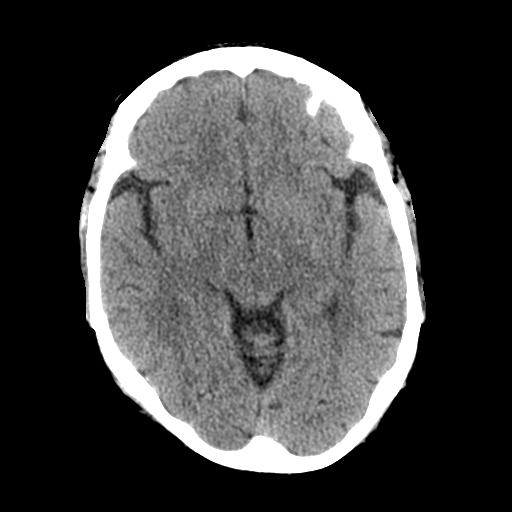
[im 16/30  brain]
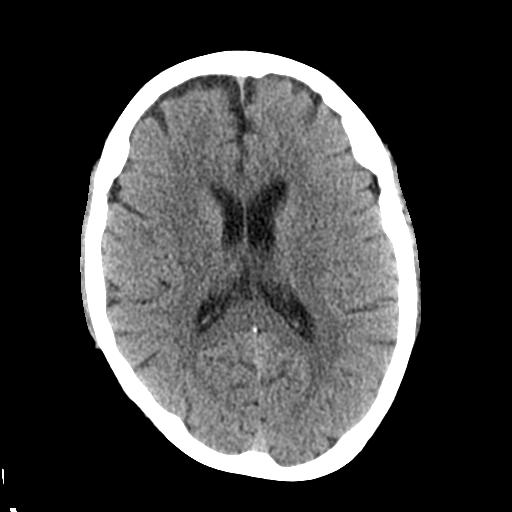
[im 16/30  bone]
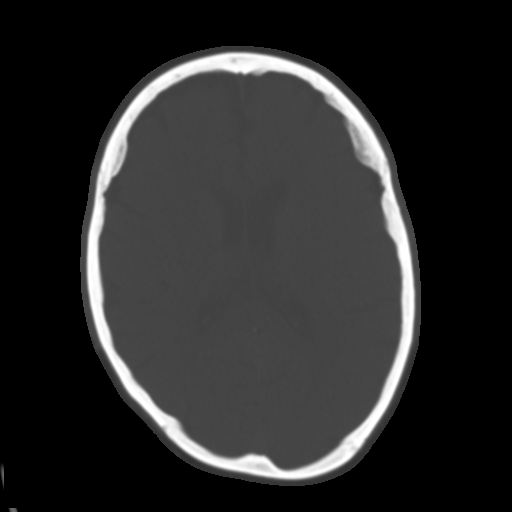
[im 19/30  brain]
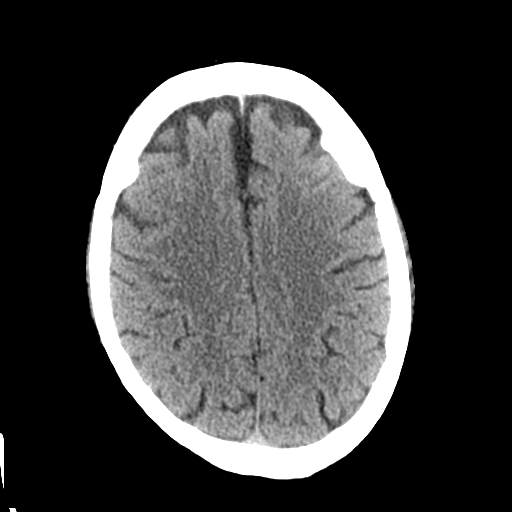
[im 22/30  brain]
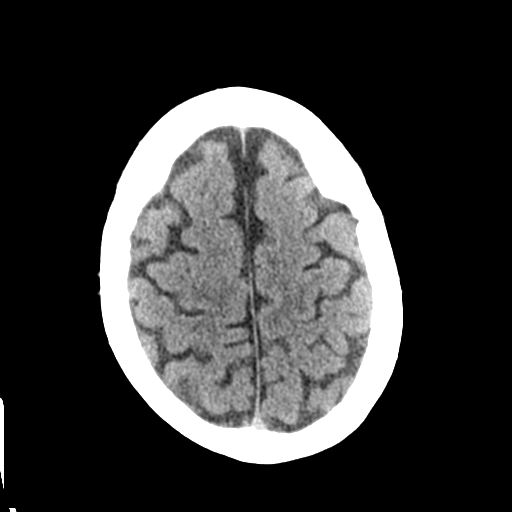
[im 25/30  brain]
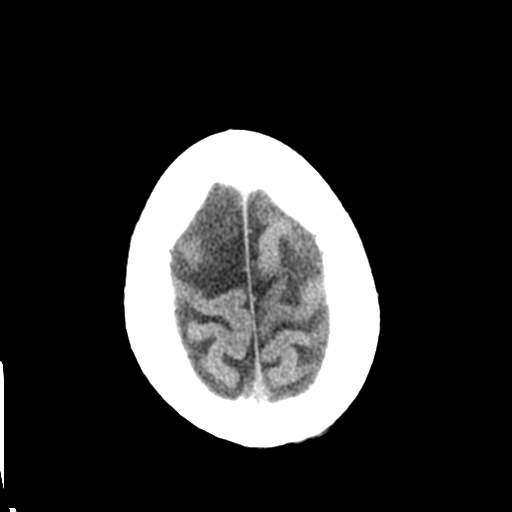
[im 28/30  brain]
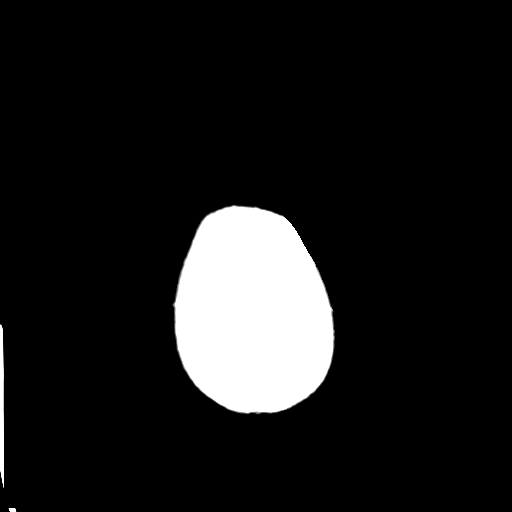
[im 28/30  bone]
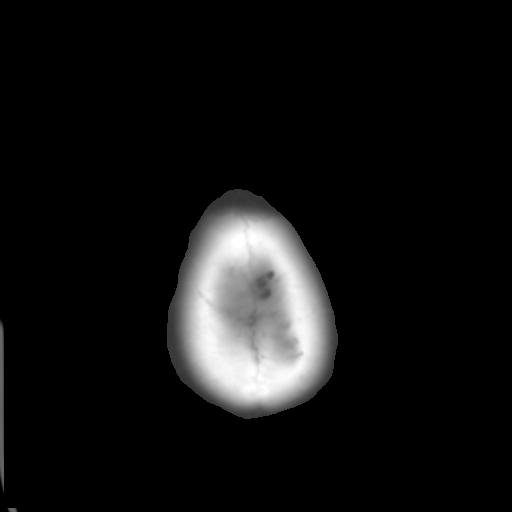

[Series 4: coronal soft tissue · coronal · 0.29mm/px · 3 of 63 slices shown]
[im 21/63  brain]
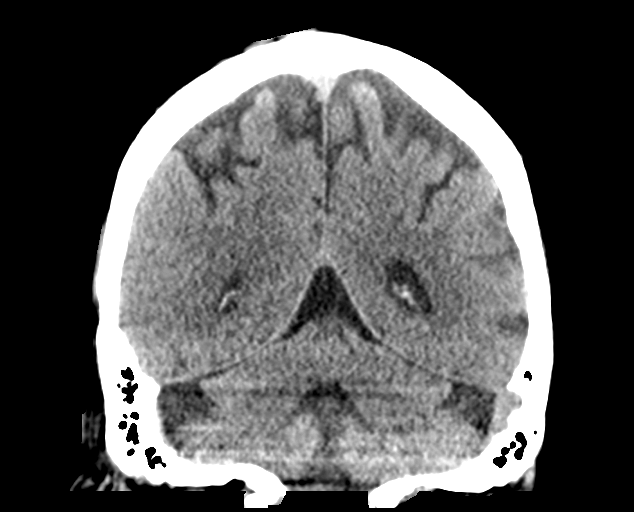
[im 28/63  brain]
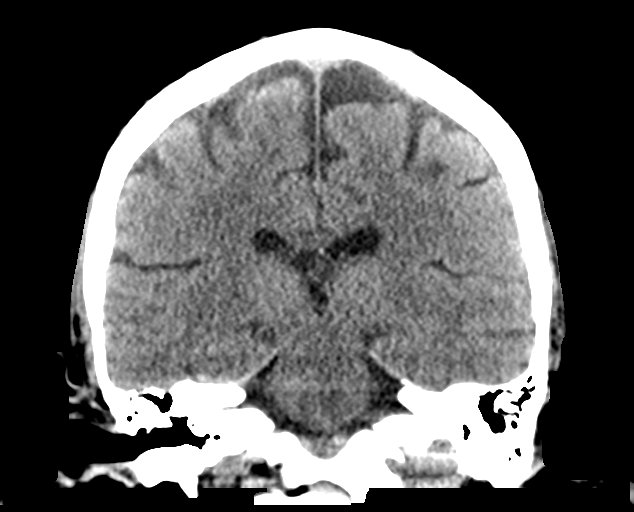
[im 35/63  brain]
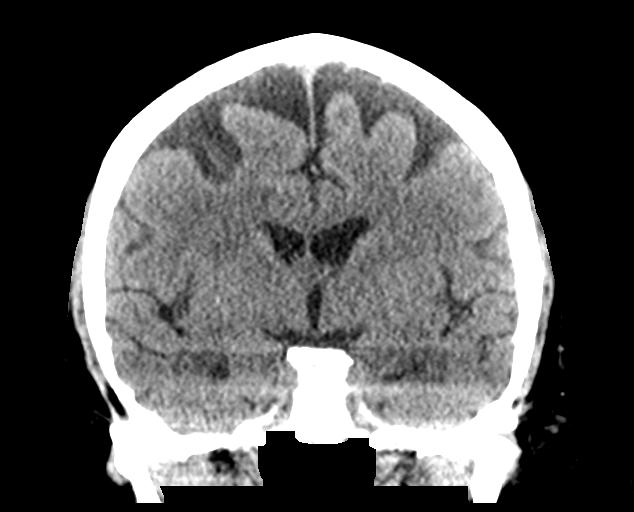

[Series 5: sagittal soft tissue · sagittal · 0.29mm/px · 3 of 52 slices shown]
[im 18/52  brain]
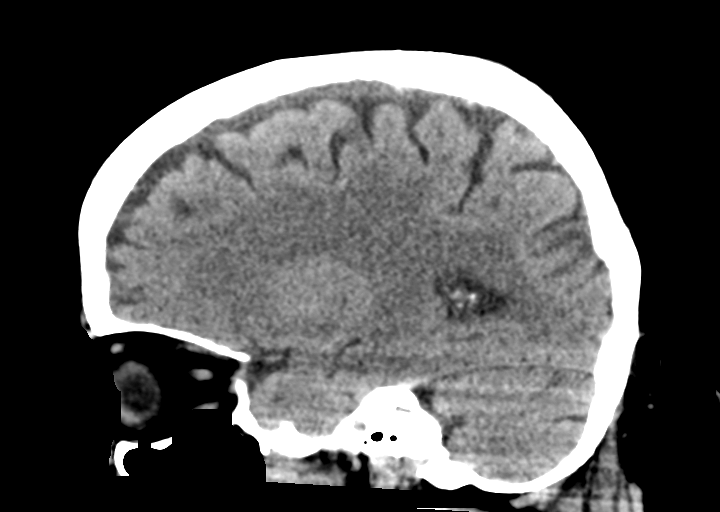
[im 26/52  brain]
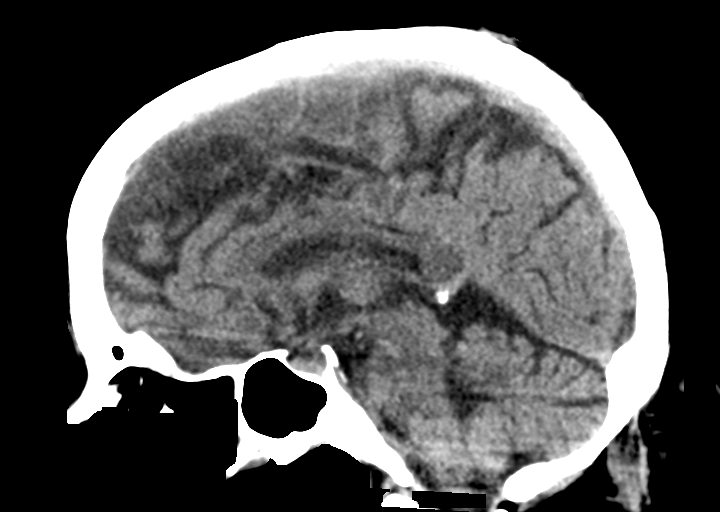
[im 35/52  brain]
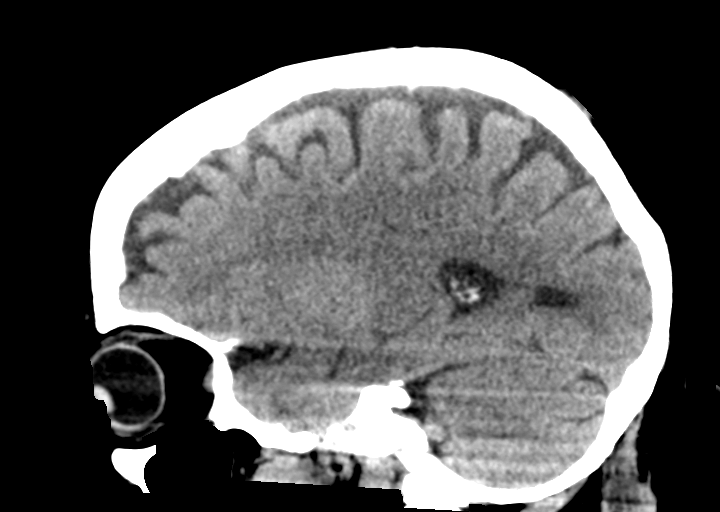

[15 of 47 positions shown; findings below may reference images not displayed]

FINDINGS: Brain: Mild sulcal prominence consistent with superficial atrophy.
No acute intracranial hemorrhage, midline shift or edema. No
intra-axial mass nor extra-axial fluid collections. No effacement of
the basal cisterns or fourth ventricle. Chronic minimal small vessel
ischemic disease.

Vascular: No hyperdense vessel or unexpected calcification. Minimal
atherosclerosis of the carotid siphons.

Skull: No acute fracture or suspicious lesions.

Sinuses/Orbits: Clear mastoids and paranasal sinuses. Intact orbits
and globes.

Other: None
IMPRESSION: Chronic minimal small vessel ischemia of periventricular white
matter. Mild superficial atrophy. No acute intracranial abnormality.

## 2017-01-09 NOTE — Discharge Instructions (Signed)
Please call and schedule an appointment with neurology. Take your Extra Strength Tylenol every 6 hours. Return to the ER for symptoms that change or worsen.

## 2017-01-09 NOTE — ED Triage Notes (Addendum)
Pt presents to ED with c/o headache since mid April. Pt states she fell and hit the left side of her head on a bookshelf. Denies loc. Pt states her fall was due to turning too fast and losing her balance while attempting to walk without her walker. Pt was told by family member to come to ED for a "CT scan to rule out a bleeding issue". Pt alert and talkative in triage with no obvious neuro deficts noted at this time. Pt does take a 81mg  aspirin daily.

## 2017-01-09 NOTE — ED Provider Notes (Signed)
[facility] Emergency Department Provider Note ____________________________________________  Time seen: Approximately 7:40 PM  I have reviewed the triage vital signs and the nursing notes.   HISTORY  Chief Complaint Headache   HPI Judith Shaw is a 66 y.o. female who presents to the emergency department for evaluation of headache that has been present since mid April. She states that she fell and struck the left side of her head on a bookshelf, but did not lose consciousness. She states that she stood up and turned too quickly which caused her to lose her balance. She states that she has had an intermittent headache that is relieved by Tylenol. She states that she does not like to take Tylenol because she is on so many other medications, but it does give her relief of the headache. Headache has not worsened over the approximately 1 month since the fall, but has not resolved completely. Headache is worsened by noise, light, and standing for long periods of time. Pain is relieved by Tylenol and lying down. She has not had any history of migraine or other headache syndromes in the past.   Past Medical History:  Diagnosis Date  . CHF (congestive heart failure) ([place])   . Depression   . Diabetes mellitus without complication ([place])   . Hyperlipidemia   . Hypertension     Patient Active Problem List   Diagnosis Date Noted  . Sepsis ([place]) 09/21/2016  . UTI (urinary tract infection) 09/21/2016  . HTN (hypertension) 09/21/2016  . Diabetes ([place]) 09/21/2016  . HLD (hyperlipidemia) 09/21/2016  . Depression 09/21/2016    Past Surgical History:  Procedure Laterality Date  . ABDOMINAL HYSTERECTOMY    . CHOLECYSTECTOMY      Prior to Admission medications   Medication Sig Start Date End Date Taking? Authorizing Provider  aspirin EC 81 MG tablet Take 81 mg by mouth daily.    [provider]  carvedilol (COREG) 3.125 MG tablet Take 3.125 mg by mouth 2 (two)  times daily with a meal.    [provider]  cefUROXime (CEFTIN) 500 MG tablet Take 1 tablet (500 mg total) by mouth 2 (two) times daily with a meal. X 5 more days 09/25/16   Gladstone Lighter, MD  clotrimazole-betamethasone (LOTRISONE) cream Apply 1 application topically 2 (two) times daily.    [provider]  DULoxetine (CYMBALTA) 30 MG capsule Take 30 mg by mouth daily.    [provider]  HUMALOG KWIKPEN 100 UNIT/ML KiwkPen Inject 16 Units into the skin 3 (three) times daily with meals.    [provider]  insulin glargine (LANTUS) 100 UNIT/ML injection Inject 0.42 mLs (42 Units total) into the skin daily. 09/26/16   Gladstone Lighter, MD  metFORMIN (GLUCOPHAGE-XR) 500 MG 24 hr tablet Take 1,000 mg by mouth 2 (two) times daily.    [provider]  oxyCODONE-acetaminophen (PERCOCET) 5-325 MG tablet Take 2 tablets by mouth every 6 (six) hours as needed for moderate pain or severe pain. 10/03/16   Earleen Newport, MD  oxyCODONE-acetaminophen (PERCOCET/ROXICET) 5-325 MG tablet Take 1 tablet by mouth every 6 (six) hours as needed for severe pain. 09/25/16   Gladstone Lighter, MD  ramipril (ALTACE) 5 MG capsule Take 5 mg by mouth daily.    [provider]  simvastatin (ZOCOR) 40 MG tablet Take 40 mg by mouth every evening.    [provider]    Allergies Codeine  Family History  Problem Relation Age of Onset  .  Hypertension Mother   . Heart disease Father     Social History Social History  Substance Use Topics  . Smoking status: Never Smoker  . Smokeless tobacco: Never Used  . Alcohol use No    Review of Systems Constitutional: No fever/chills or recent injury. Eyes: No visual changes. ENT: No sore throat. Respiratory: Denies shortness of breath. Gastrointestinal: No abdominal pain.  Occasional nausea, no vomiting.  No diarrhea.   Musculoskeletal: Negative for pain. Skin: Negative for rash. Neurological:Positive  for headache, Negative for focal weakness or numbness. No confusion or fainting. ___________________________________________   PHYSICAL EXAM:  VITAL SIGNS: ED Triage Vitals  Enc Vitals Group     BP 01/09/17 1911 (!) 122/55     Pulse Rate 01/09/17 1911 70     Resp 01/09/17 1911 18     Temp 01/09/17 1911 98.3 F (36.8 C)     Temp Source 01/09/17 1911 Oral     SpO2 01/09/17 1911 97 %     Weight 01/09/17 1911 250 lb (113.4 kg)     Height 01/09/17 1911 5\' 5"  (1.651 m)     Head Circumference --      Peak Flow --      Pain Score 01/09/17 1912 8     Pain Loc --      Pain Edu? --      Excl. in [place]? --     Constitutional: Alert and oriented. Well appearing and in no acute distress. Eyes: Conjunctivae are normal. PERRL. EOMI. No evidence of papilledema on limited exam. Head: Atraumatic. Nose: No congestion/rhinnorhea. Mouth/Throat: Mucous membranes are moist.  Oropharynx non-erythematous. Neck: No stridor. No meningismus.   Cardiovascular: Normal rate, regular rhythm. Grossly normal heart sounds.  Good peripheral circulation. Respiratory: Normal respiratory effort.  No retractions. Lungs CTAB. Musculoskeletal: No lower extremity tenderness nor edema.  No joint effusions. Neurologic:  Normal speech and language. No gross focal neurologic deficits are appreciated. No gait instability. Cranial nerves: 2-10 normal as tested. Sensorimotor: No aphasia, pronator drift, clonus, sensory loss or abnormal reflexes.  Skin:  Skin is warm, dry and intact. No rash noted. Psychiatric: Mood and affect are normal. Speech and behavior are normal. Normal thought process and cognition.  ____________________________________________   LABS (all labs ordered are listed, but only abnormal results are displayed)  Labs Reviewed - No data to display ____________________________________________  EKG  Not indicated ____________________________________________  RADIOLOGY  CT head without contrast is  negative for acute abnormality per radiology. ____________________________________________   PROCEDURES  Procedure(s) performed: None  Critical Care performed: No  ____________________________________________   INITIAL IMPRESSION / ASSESSMENT AND PLAN / ED COURSE  66 year old female presenting to the emergency department approximately one month after sustaining a head injury after a mechanical, non-syncopal fall and striking her head on a bookcase. CT of the head without contrast does not reveal any acute concerns. Tylenol does relieve her headache, however she does not take it consistently. She was given instructions to follow-up with her neurologist at clinic clinic and take Tylenol every 6 hours for headache. She was given strict return precautions and discharged home.  Pertinent labs & imaging results that were available during my care of the patient were reviewed by me and considered in my medical decision making (see chart for details). ____________________________________________   FINAL CLINICAL IMPRESSION(S) / ED DIAGNOSES  Final diagnoses:  Injury of head, initial encounter      Victorino Dike, FNP 01/09/17 2219    Orbie Pyo, MD  01/10/17 0052  

## 2017-01-24 DIAGNOSIS — R262 Difficulty in walking, not elsewhere classified: Secondary | ICD-10-CM | POA: Insufficient documentation

## 2017-01-24 DIAGNOSIS — R200 Anesthesia of skin: Secondary | ICD-10-CM | POA: Insufficient documentation

## 2017-01-24 DIAGNOSIS — R519 Headache, unspecified: Secondary | ICD-10-CM | POA: Insufficient documentation

## 2017-01-24 DIAGNOSIS — N39492 Postural (urinary) incontinence: Secondary | ICD-10-CM | POA: Insufficient documentation

## 2017-01-24 DIAGNOSIS — R202 Paresthesia of skin: Secondary | ICD-10-CM

## 2017-01-24 DIAGNOSIS — R51 Headache: Secondary | ICD-10-CM

## 2017-01-24 DIAGNOSIS — S060X0A Concussion without loss of consciousness, initial encounter: Secondary | ICD-10-CM | POA: Insufficient documentation

## 2017-02-20 NOTE — Progress Notes (Signed)
02/22/2017 3:23 PM   Judith Shaw June 30, 1951 [id number]  Referring provider: Katheren Shams [address] [place], [place] [address]  Chief Complaint  Patient presents with  . New Patient (Initial Visit)    Urinary Incontinence referred by Neurology at [facility]      HPI: Patient is a 66 -year-old Caucasian female who is referred to [name] by, Dr. Gurney Maxin, for urinary incontinence.  Patient states that she has had urinary incontinence for two years.   She has noticed a slight improvement over the last two months.    Patient has incontinence with stress, urge and positioning from a sitting to a standing position.   She is experiencing 5 incontinent episodes during the day. She is experiencing several incontinent episodes during the night.  Her incontinence volume is large and soaks through her pads and clothes.  .   She is wearing 6 to 8 pads/depends daily.    She is having associated urinary frequency, urgency, nocturia and intermittency.   She does have a history of urinary tract infections, STI's or injury to the bladder.  She denies dysuria, gross hematuria, suprapubic pain, back pain, abdominal pain or flank pain.  She has not had any recent fevers, chills, nausea or vomiting.  She does/does not have a history of nephrolithiasis, GU surgery or GU trauma.  She states she has her bladder stem stretched as a child.    She does not have a history of nephrolithiasis, GU surgery or GU trauma.   She is post menopausal.   She admits to constipation and/or diarrhea.  She is not having pain with bladder filling.    She has not had any recent imaging studies.    She is drinking  of water daily.   She is drinking several caffeinated beverages daily (artificial sweet ice tea) and Coke zero.  She is not drinking alcoholic beverages daily.    Her risk factors for incontinence are obesity, a family history of incontinence, age, caffeine, diabetes, depression, fecal  incontinence, vaginal atrophy and pelvic surgery.    She is taking opioids, OAB medication, ACE inhibitors, alpha-blocker diuretics and antidepressants.      Her PVR is 51 mL.    PMH: Past Medical History:  Diagnosis Date  . CHF (congestive heart failure) ([place])   . Depression   . Diabetes mellitus without complication ([place])   . Hyperlipidemia   . Hypertension     Surgical History: Past Surgical History:  Procedure Laterality Date  . ABDOMINAL HYSTERECTOMY    . CHOLECYSTECTOMY      Home Medications:  Allergies as of 02/22/2017      Reactions   Codeine       Medication List       Accurate as of 02/22/17  3:23 PM. Always use your most recent med list.          aspirin EC 81 MG tablet Take 81 mg by mouth daily.   carvedilol 3.125 MG tablet Commonly known as:  COREG Take 3.125 mg by mouth 2 (two) times daily with a meal.   cefUROXime 500 MG tablet Commonly known as:  CEFTIN Take 1 tablet (500 mg total) by mouth 2 (two) times daily with a meal. X 5 more days   clotrimazole-betamethasone cream Commonly known as:  LOTRISONE Apply 1 application topically 2 (two) times daily.   DULoxetine 30 MG capsule Commonly known as:  CYMBALTA Take 30 mg by mouth daily.   fluticasone 50 MCG/ACT  nasal spray Commonly known as:  FLONASE Place into the nose.   FREESTYLE LIBRE READER Devi Use 1 each as directed. Use to check sugars daily. FREE STYLE LIBRE READER E11.65   furosemide 20 MG tablet Commonly known as:  LASIX Take 20 mg every other day in the morning.   HUMALOG KWIKPEN 100 UNIT/ML KiwkPen Generic drug:  insulin lispro Inject 16 Units into the skin 3 (three) times daily with meals.   insulin glargine 100 UNIT/ML injection Commonly known as:  LANTUS Inject 0.42 mLs (42 Units total) into the skin daily.   metFORMIN 500 MG 24 hr tablet Commonly known as:  GLUCOPHAGE-XR Take 1,000 mg by mouth 2 (two) times daily.   oxyCODONE-acetaminophen 5-325 MG  tablet Commonly known as:  PERCOCET/ROXICET Take 1 tablet by mouth every 6 (six) hours as needed for severe pain.   oxyCODONE-acetaminophen 5-325 MG tablet Commonly known as:  PERCOCET Take 2 tablets by mouth every 6 (six) hours as needed for moderate pain or severe pain.   ramipril 5 MG capsule Commonly known as:  ALTACE Take 5 mg by mouth daily.   simvastatin 40 MG tablet Commonly known as:  ZOCOR Take 40 mg by mouth every evening.       Allergies:  Allergies  Allergen Reactions  . Codeine     Family History: Family History  Problem Relation Age of Onset  . Hypertension Mother   . Heart disease Father   . Prostate cancer Brother   . Kidney cancer Neg Hx   . Bladder Cancer Neg Hx     Social History:  reports that she has never smoked. She has never used smokeless tobacco. She reports that she does not drink alcohol or use drugs.  ROS: UROLOGY Frequent Urination?: Yes Hard to postpone urination?: Yes Burning/pain with urination?: No Get up at night to urinate?: Yes Leakage of urine?: Yes Urine stream starts and stops?: Yes Trouble starting stream?: Yes Do you have to strain to urinate?: No Blood in urine?: No Urinary tract infection?: Yes Sexually transmitted disease?: No Injury to kidneys or bladder?: No Painful intercourse?: No Weak stream?: No Currently pregnant?: No Vaginal bleeding?: No Last menstrual period?: n  Gastrointestinal Nausea?: Yes Vomiting?: No Indigestion/heartburn?: No Diarrhea?: Yes Constipation?: Yes  Constitutional Fever: No Night sweats?: No Weight loss?: No Fatigue?: Yes  Skin Skin rash/lesions?: Yes Itching?: Yes  Eyes Blurred vision?: Yes Double vision?: No  Ears/Nose/Throat Sore throat?: No Sinus problems?: Yes  Hematologic/Lymphatic Swollen glands?: No Easy bruising?: No  Cardiovascular Leg swelling?: Yes Chest pain?: No  Respiratory Cough?: No Shortness of breath?: Yes  Endocrine Excessive  thirst?: Yes  Musculoskeletal Back pain?: No Joint pain?: Yes  Neurological Headaches?: Yes Dizziness?: Yes  Psychologic Depression?: No Anxiety?: No  Physical Exam: BP 113/70   Pulse 80   Ht 5\' 5"  (1.651 m)   Wt 253 lb 8 oz (115 kg)   BMI 42.18 kg/m   Constitutional: Well nourished. Alert and oriented, No acute distress. HEENT: [place] AT, moist mucus membranes. Trachea midline, no masses. Cardiovascular: No clubbing, cyanosis, or edema. Respiratory: Normal respiratory effort, no increased work of breathing. GI: Obese.  Abdomen is soft, non tender, non distended, no abdominal masses. Liver and spleen not palpable.  No hernias appreciated.  Stool sample for occult testing is not indicated.   GU: No CVA tenderness.  No bladder fullness or masses.  Atrophicexternal genitalia, normal pubic hair distribution, no lesions.  Normal urethral meatus, no lesions, no prolapse, no  discharge.   No urethral masses, tenderness and/or tenderness. No bladder fullness, tenderness or masses. Pale vagina mucosa, poor estrogen effect, no discharge, no lesions, poor pelvic support, Grade II cystocele is palpated.  No rectocele noted.  Cervix, uterus and adnexa are surgically absent.  Anus and perineum are without rashes or lesions.    Skin: No rashes, bruises or suspicious lesions. Lymph: No cervical or inguinal adenopathy. Neurologic: Grossly intact, no focal deficits, moving all 4 extremities. Psychiatric: Normal mood and affect.  Laboratory Data: Lab Results  Component Value Date   WBC 11.7 (H) 10/03/2016   HGB 13.1 10/03/2016   HCT 39.1 10/03/2016   MCV 89.0 10/03/2016   PLT 361 10/03/2016    Lab Results  Component Value Date   CREATININE 0.86 10/03/2016    Lab Results  Component Value Date   HGBA1C 10.5 (H) 09/22/2016     Lab Results  Component Value Date   AST 26 09/21/2016   Lab Results  Component Value Date   ALT 19 09/21/2016    I have independently reviewed the  labs.  Pertinent Imaging: Results for ONEDA, DUFFETT (MRN [id number]) as of 02/22/2017 14:56  Ref. Range 02/22/2017 14:05  Scan Result Unknown 51     Assessment & Plan:    1. Stress incontinence  - offered behavioral therapies, bladder training and bladder control strategies - patient engaged in timed voids  - pelvic floor muscle training - patient would like the referral  - fluid management - encouraged stopping the use of artifical sweeteners and caffeine  2. Urgency  - offered medical therapy with anticholinergic therapy or beta-3 adrenergic receptor agonist and the potential side effects of each therapy   - would like to try the beta-3 adrenergic receptor agonist (Myrbetriq).  Given Myrbetriq 25 mg samples, #28.  I have reviewed with the patient of the side effects of Myrbetriq, such as: elevation in BP, urinary retention and/or HA.    - RTC in 3 weeks for PVR and symptom recheck   3. Cystocele  - see above  4. Vaginal atrophy  - Patient was given a sample of vaginal estrogen cream (Premarin/Estrace) and instructed to apply 0.5mg  (pea-sized amount)  just inside the vaginal introitus with a finger-tip every night for two weeks and then Monday, Wednesday and Friday nights.  I explained to the patient that vaginally administered estrogen, which causes only a slight increase in the blood estrogen levels, have fewer contraindications and adverse systemic effects that oral HT.  - She will follow up in three months for an exam.    5. Uncontrolled DM  - explained that patiients with DM has a 2.5 fold increase of incontinence - she has actually noticed an improvement with better control of her DM  Return in about 3 weeks (around 03/15/2017) for PVR and OAB questionnaire.  These notes generated with voice recognition software. I apologize for typographical errors.  Zara Council, [facility] [address], [place], [place] [address] [phone]

## 2017-02-22 ENCOUNTER — Encounter: Payer: Self-pay | Admitting: Urology

## 2017-02-22 ENCOUNTER — Ambulatory Visit (INDEPENDENT_AMBULATORY_CARE_PROVIDER_SITE_OTHER): Payer: Medicare Other | Admitting: Urology

## 2017-02-22 VITALS — BP 113/70 | HR 80 | Ht 65.0 in | Wt 253.5 lb

## 2017-02-22 DIAGNOSIS — E1143 Type 2 diabetes mellitus with diabetic autonomic (poly)neuropathy: Secondary | ICD-10-CM | POA: Diagnosis not present

## 2017-02-22 DIAGNOSIS — R3915 Urgency of urination: Secondary | ICD-10-CM | POA: Diagnosis not present

## 2017-02-22 DIAGNOSIS — R32 Unspecified urinary incontinence: Secondary | ICD-10-CM

## 2017-02-22 DIAGNOSIS — N393 Stress incontinence (female) (male): Secondary | ICD-10-CM

## 2017-02-22 DIAGNOSIS — N952 Postmenopausal atrophic vaginitis: Secondary | ICD-10-CM | POA: Diagnosis not present

## 2017-02-22 DIAGNOSIS — N8111 Cystocele, midline: Secondary | ICD-10-CM | POA: Diagnosis not present

## 2017-02-22 LAB — BLADDER SCAN AMB NON-IMAGING: SCAN RESULT: 51

## 2017-02-22 NOTE — Patient Instructions (Addendum)
Urinary Incontinence Urinary incontinence is the involuntary loss of urine from your bladder. What are the causes? There are many causes of urinary incontinence. They include:  Medicines.  Infections.  Prostatic enlargement, leading to overflow of urine from your bladder.  Surgery.  Neurological diseases.  Emotional factors.  What are the signs or symptoms? Urinary Incontinence can be divided into four types: 1. Urge incontinence. Urge incontinence is the involuntary loss of urine before you have the opportunity to go to the bathroom. There is a sudden urge to void but not enough time to reach a bathroom. 2. Stress incontinence. Stress incontinence is the sudden loss of urine with any activity that forces urine to pass. It is commonly caused by anatomical changes to the pelvis and sphincter areas of your body. 3. Overflow incontinence. Overflow incontinence is the loss of urine from an obstructed opening to your bladder. This results in a backup of urine and a resultant buildup of pressure within the bladder. When the pressure within the bladder exceeds the closing pressure of the sphincter, the urine overflows, which causes incontinence, similar to water overflowing a dam. 4. Total incontinence. Total incontinence is the loss of urine as a result of the inability to store urine within your bladder.  How is this diagnosed? Evaluating the cause of incontinence may require:  A thorough and complete medical and obstetric history.  A complete physical exam.  Laboratory tests such as a urine culture and sensitivities.  When additional tests are indicated, they can include:  An ultrasound exam.  Kidney and bladder X-rays.  Cystoscopy. This is an exam of the bladder using a narrow scope.  Urodynamic testing to test the nerve function to the bladder and sphincter areas.  How is this treated? Treatment for urinary incontinence depends on the cause:  For urge incontinence caused  by a bacterial infection, antibiotics will be prescribed. If the urge incontinence is related to medicines you take, your health care provider may have you change the medicine.  For stress incontinence, surgery to re-establish anatomical support to the bladder or sphincter, or both, will often correct the condition.  For overflow incontinence caused by an enlarged prostate, an operation to open the channel through the enlarged prostate will allow the flow of urine out of the bladder. In women with fibroids, a hysterectomy may be recommended.  For total incontinence, surgery on your urinary sphincter may help. An artificial urinary sphincter (an inflatable cuff placed around the urethra) may be required. In women who have developed a hole-like passage between their bladder and vagina (vesicovaginal fistula), surgery to close the fistula often is required.  Follow these instructions at home:  Normal daily hygiene and the use of pads or adult diapers that are changed regularly will help prevent odors and skin damage.  Avoid caffeine. It can overstimulate your bladder.  Use the bathroom regularly. Try about every 2-3 hours to go to the bathroom, even if you do not feel the need to do so. Take time to empty your bladder completely. After urinating, wait a minute. Then try to urinate again.  For causes involving nerve dysfunction, keep a log of the medicines you take and a journal of the times you go to the bathroom. Contact a health care provider if:  You experience worsening of pain instead of improvement in pain after your procedure.  Your incontinence becomes worse instead of better. Get help right away if:  You experience fever or shaking chills.  You are unable to   pass your urine.  You have redness spreading into your groin or down into your thighs. This information is not intended to replace advice given to you by your health care provider. Make sure you discuss any questions you have  with your health care provider. Document Released: 09/21/2004 Document Revised: 03/24/2016 Document Reviewed: 01/21/2013 Elsevier Interactive Patient Education  2018 [place] were given a sample of vaginal estrogen cream (Premarin) and instructed to apply a (pea-sized amount)  just inside the vaginal introitus with a finger-tip every night for two weeks and then Monday, Wednesday and Friday nights.      We will refer to physical therapy.    Mirabegron extended-release tablets What is this medicine? MIRABEGRON (MIR a BEG ron) is used to treat overactive bladder. This medicine reduces the amount of bathroom visits. It may also help to control wetting accidents. This medicine may be used for other purposes; ask your health care provider or pharmacist if you have questions. COMMON BRAND NAME(S): Myrbetriq What should I tell my health care provider before I take this medicine? They need to know if you have any of these conditions: -difficulty passing urine -high blood pressure -kidney disease -liver disease -an unusual or allergic reaction to mirabegron, other medicines, foods, dyes, or preservatives -pregnant or trying to get pregnant -breast-feeding How should I use this medicine? Take this medicine by mouth with a glass of water. Follow the directions on the prescription label. Do not cut, crush or chew this medicine. You can take it with or without food. If it upsets your stomach, take it with food. Take your medicine at regular intervals. Do not take it more often than directed. Do not stop taking except on your doctor's advice. Talk to your pediatrician regarding the use of this medicine in children. Special care may be needed. Overdosage: If you think you have taken too much of this medicine contact a poison control center or emergency room at once. NOTE: This medicine is only for you. Do not share this medicine with others. What if I miss a dose? If you miss a dose, take it  as soon as you can. If it is almost time for your next dose, take only that dose. Do not take double or extra doses. What may interact with this medicine? -certain medicines for bladder problems like fesoterodine, oxybutynin, solifenacin, tolterodine -desipramine -digoxin -flecainide -ketoconazole -MAOIs like Carbex, Eldepryl, Marplan, Nardil, and Parnate -metoprolol -propafenone -thioridazine -warfarin This list may not describe all possible interactions. Give your health care provider a list of all the medicines, herbs, non-prescription drugs, or dietary supplements you use. Also tell them if you smoke, drink alcohol, or use illegal drugs. Some items may interact with your medicine. What should I watch for while using this medicine? It may take 8 weeks to notice the full benefit from this medicine. You may need to limit your intake tea, coffee, caffeinated sodas, and alcohol. These drinks may make your symptoms worse. Visit your doctor or health care professional for regular checks on your progress. Check your blood pressure as directed. Ask your doctor or health care professional what your blood pressure should be and when you should contact him or her. What side effects may I notice from receiving this medicine? Side effects that you should report to your doctor or health care professional as soon as possible: -allergic reactions like skin rash, itching or hives, swelling of the face, lips, or tongue -chest pain or palpitations -severe or sudden headache -  high blood pressure -fast, irregular heartbeat -redness, blistering, peeling or loosening of the skin, including inside the mouth -signs of infection like fever or chills; cough; sore throat; pain or difficulty passing urine -trouble passing urine or change in the amount of urine Side effects that usually do not require medical attention (report to your doctor or health care professional if they continue or are  bothersome): -constipation -diarrhea -dizziness -dry eyes -joint pain -mild headache -nausea -runny nose This list may not describe all possible side effects. Call your doctor for medical advice about side effects. You may report side effects to FDA at 1-800-FDA-1088. Where should I keep my medicine? Keep out of the reach of children. Store at room temperature between 15 and 30 degrees C (59 and 86 degrees F). Throw away any unused medicine after the expiration date. NOTE: This sheet is a summary. It may not cover all possible information. If you have questions about this medicine, talk to your doctor, pharmacist, or health care provider.  2018 Elsevier/Gold Standard (2015-09-16 12:14:30)

## 2017-03-14 ENCOUNTER — Encounter: Payer: Self-pay | Admitting: Urology

## 2017-03-14 ENCOUNTER — Ambulatory Visit: Payer: Medicare Other | Admitting: Urology

## 2017-04-03 DIAGNOSIS — F0781 Postconcussional syndrome: Secondary | ICD-10-CM | POA: Insufficient documentation

## 2017-04-05 DIAGNOSIS — H9313 Tinnitus, bilateral: Secondary | ICD-10-CM | POA: Insufficient documentation

## 2017-04-09 ENCOUNTER — Emergency Department
Admission: EM | Admit: 2017-04-09 | Discharge: 2017-04-10 | Disposition: A | Discharge status: Home / Self Care | Payer: Medicare Other | Attending: Emergency Medicine | Admitting: Emergency Medicine

## 2017-04-09 ENCOUNTER — Encounter: Payer: Self-pay | Admitting: Emergency Medicine

## 2017-04-09 DIAGNOSIS — E1165 Type 2 diabetes mellitus with hyperglycemia: Secondary | ICD-10-CM | POA: Diagnosis not present

## 2017-04-09 DIAGNOSIS — K859 Acute pancreatitis without necrosis or infection, unspecified: Secondary | ICD-10-CM | POA: Diagnosis not present

## 2017-04-09 DIAGNOSIS — R112 Nausea with vomiting, unspecified: Secondary | ICD-10-CM

## 2017-04-09 DIAGNOSIS — Z7982 Long term (current) use of aspirin: Secondary | ICD-10-CM | POA: Insufficient documentation

## 2017-04-09 DIAGNOSIS — I509 Heart failure, unspecified: Secondary | ICD-10-CM | POA: Diagnosis not present

## 2017-04-09 DIAGNOSIS — Z794 Long term (current) use of insulin: Secondary | ICD-10-CM | POA: Insufficient documentation

## 2017-04-09 DIAGNOSIS — I110 Hypertensive heart disease with heart failure: Secondary | ICD-10-CM | POA: Diagnosis not present

## 2017-04-09 DIAGNOSIS — R739 Hyperglycemia, unspecified: Secondary | ICD-10-CM

## 2017-04-09 DIAGNOSIS — Z79899 Other long term (current) drug therapy: Secondary | ICD-10-CM | POA: Insufficient documentation

## 2017-04-09 LAB — COMPREHENSIVE METABOLIC PANEL
ALBUMIN: 3.7 g/dL (ref 3.5–5.0)
ALK PHOS: 79 U/L (ref 38–126)
ALT: 18 U/L (ref 14–54)
ANION GAP: 9 (ref 5–15)
AST: 25 U/L (ref 15–41)
BUN: 24 mg/dL — ABNORMAL HIGH (ref 6–20)
CHLORIDE: 101 mmol/L (ref 101–111)
CO2: 26 mmol/L (ref 22–32)
Calcium: 9.2 mg/dL (ref 8.9–10.3)
Creatinine, Ser: 1.28 mg/dL — ABNORMAL HIGH (ref 0.44–1.00)
GFR calc non Af Amer: 42 mL/min — ABNORMAL LOW (ref >60)
GFR, EST AFRICAN AMERICAN: 49 mL/min — AB (ref >60)
GLUCOSE: 470 mg/dL — AB (ref 65–99)
POTASSIUM: 4.6 mmol/L (ref 3.5–5.1)
SODIUM: 136 mmol/L (ref 135–145)
Total Bilirubin: 0.6 mg/dL (ref 0.3–1.2)
Total Protein: 7.2 g/dL (ref 6.5–8.1)

## 2017-04-09 LAB — CBC
HEMATOCRIT: 44 % (ref 35.0–47.0)
HEMOGLOBIN: 14.8 g/dL (ref 12.0–16.0)
MCH: 29.7 pg (ref 26.0–34.0)
MCHC: 33.6 g/dL (ref 32.0–36.0)
MCV: 88.6 fL (ref 80.0–100.0)
Platelets: 264 10*3/uL (ref 150–440)
RBC: 4.96 MIL/uL (ref 3.80–5.20)
RDW: 13.8 % (ref 11.5–14.5)
WBC: 9.2 10*3/uL (ref 3.6–11.0)

## 2017-04-09 LAB — TROPONIN I: Troponin I: <0.03 ng/mL (ref <0.03)

## 2017-04-09 LAB — LIPASE, BLOOD: LIPASE: 92 U/L — AB (ref 11–51)

## 2017-04-09 NOTE — ED Triage Notes (Addendum)
Pt to room3 via EMS with no distress noted; pt was in-line ordering food, and notified clerk to call EMS because she wasn't feeling well; upon arrival pt had vomited, BP 66/42 after trendelenberg SBP 90's; FSBS 462; pt arrives A&Ox3; st she had sudden onset "twinge" in chest, nausea, not feeling well; denies hx of same, denies recent illness but st "just hasn't felt right all day"; st has been out of her furosemide, potassium & duloxetine x month; resp even/unlab, lungs clear, apical audible & regular, +BS, abd soft/nondist, +PP, -edema; card monitor in place

## 2017-04-10 LAB — URINALYSIS, COMPLETE (UACMP) WITH MICROSCOPIC
Bacteria, UA: NONE SEEN
Bilirubin Urine: NEGATIVE
Hgb urine dipstick: NEGATIVE
Ketones, ur: NEGATIVE mg/dL
Leukocytes, UA: NEGATIVE
NITRITE: NEGATIVE
PH: 5 (ref 5.0–8.0)
Protein, ur: 100 mg/dL — AB
SPECIFIC GRAVITY, URINE: 1.018 (ref 1.005–1.030)

## 2017-04-10 LAB — TROPONIN I: Troponin I: <0.03 ng/mL (ref <0.03)

## 2017-04-10 LAB — GLUCOSE, CAPILLARY
GLUCOSE-CAPILLARY: 374 mg/dL — AB (ref 65–99)
GLUCOSE-CAPILLARY: 320 mg/dL — AB (ref 65–99)
Glucose-Capillary: 379 mg/dL — ABNORMAL HIGH (ref 65–99)

## 2017-04-10 MED ORDER — INSULIN ASPART 100 UNIT/ML ~~LOC~~ SOLN
5.0000 [IU] | Freq: Once | SUBCUTANEOUS | Status: AC
  Administered 2017-04-10: 5 [IU] via SUBCUTANEOUS
  Filled 2017-04-10: qty 1

## 2017-04-10 MED ORDER — SODIUM CHLORIDE 0.9 % IV BOLUS (SEPSIS)
1000.0000 mL | Freq: Once | INTRAVENOUS | Status: AC
  Administered 2017-04-10: 1000 mL via INTRAVENOUS

## 2017-04-10 MED ORDER — SODIUM CHLORIDE 0.9 % IV BOLUS (SEPSIS)
500.0000 mL | Freq: Once | INTRAVENOUS | Status: AC
  Administered 2017-04-10: 500 mL via INTRAVENOUS

## 2017-04-10 MED ORDER — INSULIN ASPART 100 UNIT/ML ~~LOC~~ SOLN
10.0000 [IU] | Freq: Once | SUBCUTANEOUS | Status: AC
  Administered 2017-04-10: 10 [IU] via SUBCUTANEOUS
  Filled 2017-04-10: qty 1

## 2017-04-10 NOTE — ED Notes (Signed)
Pt sitting up in chair in exam room, drinking water with no distress; denies any c/o at present

## 2017-04-10 NOTE — ED Provider Notes (Signed)
[facility] Emergency Department Provider Note   ____________________________________________   First MD Initiated Contact with Patient 04/09/17 2323     (approximate)  I have reviewed the triage vital signs and the nursing notes.   HISTORY  Chief Complaint Chest Injury and Nausea    HPI Judith Shaw is a 66 y.o. female who comes into the hospital today with some nausea and vomiting. The patient reports that she was feeling fine until around 945 this evening. She had been in bed all day as it has been a long week. The patient reports that she was having a birthday celebration's with her and the family member earlier this week. She reports that she just felt very tired. She got up around 9 PM and went to finish her breakfast and dinner. The patient had a grilled cheese sandwich with some broccoli and cheese soup. She states that she then went to a cookout and was waiting in line to get some food for her brother. She reports that she was doing well until she started feeling sick. She had a couple of twinges of pain in her left chest some abdominal discomfort in her upper abdomen and a mild headache. She reports that she Pulling up but by the time she pulled up to the window she felt like she was going to pass out and vomited. She asked the staff to contact EMS and they did give her a bag in which she vomited. The patient reports that EMS arrived and when they checked her they state that her blood pressure and her pulse were very low. The patient reports though that when she vomited she felt improved immediately. She states that when she got into the ambulance she also felt well and she feels okay right now. She denies any abdominal pain, chest pain, shortness of breath, lightheadedness. The patient is here today for evaluation.   Past Medical History:  Diagnosis Date  . CHF (congestive heart failure) ([place])   . Depression   . Diabetes mellitus without complication  ([place])   . Hyperlipidemia   . Hypertension     Patient Active Problem List   Diagnosis Date Noted  . Sepsis ([place]) 09/21/2016  . UTI (urinary tract infection) 09/21/2016  . HTN (hypertension) 09/21/2016  . Diabetes ([place]) 09/21/2016  . HLD (hyperlipidemia) 09/21/2016  . Depression 09/21/2016    Past Surgical History:  Procedure Laterality Date  . ABDOMINAL HYSTERECTOMY    . CHOLECYSTECTOMY      Prior to Admission medications   Medication Sig Start Date End Date Taking? Authorizing Provider  aspirin EC 81 MG tablet Take 81 mg by mouth daily.   Yes [provider]  carvedilol (COREG) 3.125 MG tablet Take 3.125 mg by mouth 2 (two) times daily with a meal.   Yes [provider]  DULoxetine (CYMBALTA) 30 MG capsule Take 30 mg by mouth daily.   Yes [provider]  furosemide (LASIX) 20 MG tablet Take 20 mg every other day in the morning. 11/29/16  Yes [provider]  HUMALOG KWIKPEN 100 UNIT/ML KiwkPen Inject 16-22 Units into the skin 3 (three) times daily with meals. Per sliding scale   Yes [provider]  insulin glargine (LANTUS) 100 UNIT/ML injection Inject 0.42 mLs (42 Units total) into the skin daily. Patient taking differently: Inject 100 Units into the skin at bedtime.  09/26/16  Yes Gladstone Lighter, MD  metFORMIN (GLUCOPHAGE-XR) 500 MG 24 hr tablet Take 1,000 mg by  mouth 2 (two) times daily.   Yes [provider]  mirabegron ER (MYRBETRIQ) 25 MG TB24 tablet Take 25 mg by mouth daily.   Yes [provider]  ramipril (ALTACE) 5 MG capsule Take 5 mg by mouth daily.   Yes [provider]  simvastatin (ZOCOR) 40 MG tablet Take 40 mg by mouth every evening.   Yes [provider]  cefUROXime (CEFTIN) 500 MG tablet Take 1 tablet (500 mg total) by mouth 2 (two) times daily with a meal. X 5 more days Patient not taking: Reported on 02/22/2017 09/25/16   Gladstone Lighter, MD  clotrimazole-betamethasone  (LOTRISONE) cream Apply 1 application topically 2 (two) times daily.    [provider]  Continuous Blood Gluc Receiver (FREESTYLE LIBRE READER) DEVI Use 1 each as directed. Use to check sugars daily. FREE STYLE LIBRE READER E11.65 10/18/16   [provider]  oxyCODONE-acetaminophen (PERCOCET/ROXICET) 5-325 MG tablet Take 1 tablet by mouth every 6 (six) hours as needed for severe pain. Patient not taking: Reported on 02/22/2017 09/25/16   Gladstone Lighter, MD    Allergies Codeine  Family History  Problem Relation Age of Onset  . Hypertension Mother   . Heart disease Father   . Prostate cancer Brother   . Kidney cancer Neg Hx   . Bladder Cancer Neg Hx     Social History Social History  Substance Use Topics  . Smoking status: Never Smoker  . Smokeless tobacco: Never Used  . Alcohol use No    Review of Systems  Constitutional: No fever/chills Eyes: No visual changes. ENT: No sore throat. Cardiovascular: chest pain. Respiratory: Denies shortness of breath. Gastrointestinal:  abdominal pain.   nausea,  vomiting.  No diarrhea.  No constipation. Genitourinary: Negative for dysuria. Musculoskeletal: Negative for back pain. Skin: Negative for rash. Neurological: pre syncope, headache   ____________________________________________   PHYSICAL EXAM:  VITAL SIGNS: ED Triage Vitals  Enc Vitals Group     BP 04/09/17 2235 (!) 112/46     Pulse Rate 04/09/17 2235 68     Resp 04/09/17 2235 18     Temp 04/09/17 2235 97.6 F (36.4 C)     Temp Source 04/09/17 2235 Oral     SpO2 04/09/17 2235 100 %     Weight 04/09/17 2236 255 lb (115.7 kg)     Height 04/09/17 2236 5\' 5"  (1.651 m)     Head Circumference --      Peak Flow --      Pain Score --      Pain Loc --      Pain Edu? --      Excl. in [place]? --     Constitutional: Alert and oriented. Well appearing and in no acute distress. Eyes: Conjunctivae are normal. PERRL. EOMI. Head: Atraumatic. Nose: No  congestion/rhinnorhea. Mouth/Throat: Mucous membranes are moist.  Oropharynx non-erythematous. Cardiovascular: Normal rate, regular rhythm. Grossly normal heart sounds.  Good peripheral circulation. Respiratory: Normal respiratory effort.  No retractions. Lungs CTAB. Gastrointestinal: Soft and nontender. No distention.positive bowel sounds. Musculoskeletal: No lower extremity tenderness nor edema. Neurologic:  Normal speech and language. Cranial nerves II throught XII grossly intact with no focal motor or neuro deficits Skin:  Skin is warm, dry and intact.  Psychiatric: Mood and affect are normal.   ____________________________________________   LABS (all labs ordered are listed, but only abnormal results are displayed)  Labs Reviewed  LIPASE, BLOOD - Abnormal; Notable for the following:  Result Value   Lipase 92 (*)    All other components within normal limits  COMPREHENSIVE METABOLIC PANEL - Abnormal; Notable for the following:    Glucose, Bld 470 (*)    BUN 24 (*)    Creatinine, Ser 1.28 (*)    GFR calc non Af Amer 42 (*)    GFR calc Af Amer 49 (*)    All other components within normal limits  URINALYSIS, COMPLETE (UACMP) WITH MICROSCOPIC - Abnormal; Notable for the following:    Color, Urine YELLOW (*)    APPearance HAZY (*)    Glucose, UA >=500 (*)    Protein, ur 100 (*)    Squamous Epithelial / LPF 6-30 (*)    All other components within normal limits  GLUCOSE, CAPILLARY - Abnormal; Notable for the following:    Glucose-Capillary 379 (*)    All other components within normal limits  GLUCOSE, CAPILLARY - Abnormal; Notable for the following:    Glucose-Capillary 374 (*)    All other components within normal limits  GLUCOSE, CAPILLARY - Abnormal; Notable for the following:    Glucose-Capillary 320 (*)    All other components within normal limits  CBC  TROPONIN I  TROPONIN I  CBG MONITORING, ED  CBG MONITORING, ED  CBG MONITORING, ED    ____________________________________________  EKG  ED ECG REPORT I, Loney Hering, the attending physician, personally viewed and interpreted this ECG.   Date: 04/09/2017  EKG Time: 2235  Rate: 68  Rhythm: normal sinus rhythm  Axis: normal  Intervals:none  ST&T Change: none  ____________________________________________  RADIOLOGY  No results found.  ____________________________________________   PROCEDURES  Procedure(s) performed: None  Procedures  Critical Care performed: No  ____________________________________________   INITIAL IMPRESSION / ASSESSMENT AND PLAN / ED COURSE  Pertinent labs & imaging results that were available during my care of the patient were reviewed by me and considered in my medical decision making (see chart for details).  This is a 66 year old female who comes into the hospital today with an episode of abdominal pain chest pain and vomiting. The patient had a low blood pressure but by the time she arrived her blood pressure was improved. The patient's blood sugar that was elevated. I did give her a liter of normal saline. She reports it is likely because she had cake today. The patient's blood sugar was 379 after the fluids. I gave her some NovoLog another 500 mg bolus of normal saline. The patient's blood work some unremarkable aside from her lipase which shows some mild pancreatitis. The patient has been drinking without any worsening pain or vomiting in the emergency department. I will reassess the patient's troponin and try to get her blood sugars below 300. I will then reassess and disposition the patient.     The patient received a second bolus of 500 mL of normal saline as well as 5 units of insulin. The patient sugars came down to 320. The patient is feeling well so I will discharge her to have her follow-up with her primary care physician. The patient has no other complaints. Her repeat troponin was also  negative. ____________________________________________   FINAL CLINICAL IMPRESSION(S) / ED DIAGNOSES  Final diagnoses:  Acute pancreatitis, unspecified complication status, unspecified pancreatitis type  Non-intractable vomiting with nausea, unspecified vomiting type  Hyperglycemia      NEW MEDICATIONS STARTED DURING THIS VISIT:  New Prescriptions   No medications on file     Note:  This document  was prepared using Systems analyst and may include unintentional dictation errors.    Loney Hering, MD 04/10/17 [phone]

## 2017-04-10 NOTE — ED Notes (Signed)
Pt resting quietly on stretcher in darkened exam room, eyes closed, resp even/unlab

## 2017-04-10 NOTE — Discharge Instructions (Signed)
PLease follow up with your primary care physician for further eval

## 2017-04-10 NOTE — ED Notes (Signed)
Pt sitting in chair in exam room with no distress, no c/o; drinking diet cola

## 2017-04-13 ENCOUNTER — Telehealth: Payer: Self-pay | Admitting: Urology

## 2017-04-13 NOTE — Telephone Encounter (Signed)
Samples recorded.

## 2017-04-13 NOTE — Telephone Encounter (Signed)
Patient called the office to reschedule her missed appointment.  She is going out of town to stay with friends and is requesting Myrbetriq 25mg  samples.  She does not want to soil their bed sheets.    I rescheduled her appointment until Tuesday, 8/28 and provided her with samples of 25mg  Myrbetriq (#28).

## 2017-04-23 NOTE — Progress Notes (Deleted)
04/24/2017 8:08 PM   Judith Shaw 07/27/1951 [id number]  Referring provider: Kirk Ruths, MD [facility] [place], [place] [address]  No chief complaint on file.   HPI: 66 yo diabetic WF with stress urinary incontinence, urgency, cystocele and vaginal atrophy who presents today for follow up.  Background history Patient is a 29 -year-old Caucasian female who is referred to [name] by, Dr. Gurney Maxin, for urinary incontinence.  Patient states that she has had urinary incontinence for two years.   She has noticed a slight improvement over the last two months.  Patient has incontinence with stress, urge and positioning from a sitting to a standing position.   She is experiencing 5 incontinent episodes during the day. She is experiencing several incontinent episodes during the night.  Her incontinence volume is large and soaks through her pads and clothes.  .   She is wearing 6 to 8 pads/depends daily.  She is having associated urinary frequency, urgency, nocturia and intermittency.   She does have a history of urinary tract infections, STI's or injury to the bladder.  She denies dysuria, gross hematuria, suprapubic pain, back pain, abdominal pain or flank pain.  She has not had any recent fevers, chills, nausea or vomiting.  She does/does not have a history of nephrolithiasis, GU surgery or GU trauma.  She states she has her bladder stem stretched as a child.  She does not have a history of nephrolithiasis, GU surgery or GU trauma.  She is post menopausal.  She admits to constipation and/or diarrhea.  She is not having pain with bladder filling.  She has not had any recent imaging studies.  She is drinking  of water daily.   She is drinking several caffeinated beverages daily (artificial sweet ice tea) and Coke zero.  She is not drinking alcoholic beverages daily.  Her risk factors for incontinence are obesity, a family history of incontinence, age, caffeine,  diabetes, depression, fecal incontinence, vaginal atrophy and pelvic surgery.   She is taking opioids, OAB medication, ACE inhibitors, alpha-blocker diuretics and antidepressants.   Her PVR is 51 mL.    At her visit on 02/22/2017, she was initiated on Myrbetriq and vaginal estrogen cream.  A referral was placed for PT.  She was encouraged to keep her BS under control and avoid drinks with artificial sweeteners.    Today, The patient has been experiencing urgency x *** (***), frequency x *** (***), not/is restricting fluids to avoid visits to the restroom ***, not/is engaging in toilet mapping, incontinence x *** (***) and nocturia x *** (***).  Her PVR is ***  PMH: Past Medical History:  Diagnosis Date  . CHF (congestive heart failure) ([place])   . Depression   . Diabetes mellitus without complication ([place])   . Hyperlipidemia   . Hypertension     Surgical History: Past Surgical History:  Procedure Laterality Date  . ABDOMINAL HYSTERECTOMY    . CHOLECYSTECTOMY      Home Medications:  Allergies as of 04/24/2017      Reactions   Codeine       Medication List       Accurate as of 04/23/17  8:08 PM. Always use your most recent med list.          aspirin EC 81 MG tablet Take 81 mg by mouth daily.   carvedilol 3.125 MG tablet Commonly known as:  COREG Take 3.125 mg by mouth 2 (two)  times daily with a meal.   cefUROXime 500 MG tablet Commonly known as:  CEFTIN Take 1 tablet (500 mg total) by mouth 2 (two) times daily with a meal. X 5 more days   clotrimazole-betamethasone cream Commonly known as:  LOTRISONE Apply 1 application topically 2 (two) times daily.   DULoxetine 30 MG capsule Commonly known as:  CYMBALTA Take 30 mg by mouth daily.   FREESTYLE LIBRE READER Devi Use 1 each as directed. Use to check sugars daily. FREE STYLE LIBRE READER E11.65   furosemide 20 MG tablet Commonly known as:  LASIX Take 20 mg every other day in the morning.   HUMALOG KWIKPEN 100  UNIT/ML KiwkPen Generic drug:  insulin lispro Inject 16-22 Units into the skin 3 (three) times daily with meals. Per sliding scale   insulin glargine 100 UNIT/ML injection Commonly known as:  LANTUS Inject 0.42 mLs (42 Units total) into the skin daily.   metFORMIN 500 MG 24 hr tablet Commonly known as:  GLUCOPHAGE-XR Take 1,000 mg by mouth 2 (two) times daily.   mirabegron ER 25 MG Tb24 tablet Commonly known as:  MYRBETRIQ Take 25 mg by mouth daily.   oxyCODONE-acetaminophen 5-325 MG tablet Commonly known as:  PERCOCET/ROXICET Take 1 tablet by mouth every 6 (six) hours as needed for severe pain.   ramipril 5 MG capsule Commonly known as:  ALTACE Take 5 mg by mouth daily.   simvastatin 40 MG tablet Commonly known as:  ZOCOR Take 40 mg by mouth every evening.       Allergies:  Allergies  Allergen Reactions  . Codeine     Family History: Family History  Problem Relation Age of Onset  . Hypertension Mother   . Heart disease Father   . Prostate cancer Brother   . Kidney cancer Neg Hx   . Bladder Cancer Neg Hx     Social History:  reports that she has never smoked. She has never used smokeless tobacco. She reports that she does not drink alcohol or use drugs.  ROS:                                        Physical Exam: There were no vitals taken for this visit.  Constitutional: Well nourished. Alert and oriented, No acute distress. HEENT: [place] AT, moist mucus membranes. Trachea midline, no masses. Cardiovascular: No clubbing, cyanosis, or edema. Respiratory: Normal respiratory effort, no increased work of breathing. GI: Obese.  Abdomen is soft, non tender, non distended, no abdominal masses. Liver and spleen not palpable.  No hernias appreciated.  Stool sample for occult testing is not indicated.   GU: No CVA tenderness.  No bladder fullness or masses.  Atrophicexternal genitalia, normal pubic hair distribution, no lesions.  Normal urethral  meatus, no lesions, no prolapse, no discharge.   No urethral masses, tenderness and/or tenderness. No bladder fullness, tenderness or masses. Pale vagina mucosa, poor estrogen effect, no discharge, no lesions, poor pelvic support, Grade II cystocele is palpated.  No rectocele noted.  Cervix, uterus and adnexa are surgically absent.  Anus and perineum are without rashes or lesions.    Skin: No rashes, bruises or suspicious lesions. Lymph: No cervical or inguinal adenopathy. Neurologic: Grossly intact, no focal deficits, moving all 4 extremities. Psychiatric: Normal mood and affect.  Laboratory Data: Lab Results  Component Value Date   WBC 9.2 04/09/2017   HGB  14.8 04/09/2017   HCT 44.0 04/09/2017   MCV 88.6 04/09/2017   PLT 264 04/09/2017    Lab Results  Component Value Date   CREATININE 1.28 (H) 04/09/2017    Lab Results  Component Value Date   HGBA1C 10.5 (H) 09/22/2016     Lab Results  Component Value Date   AST 25 04/09/2017   Lab Results  Component Value Date   ALT 18 04/09/2017    I have reviewed the labs.  Pertinent Imaging: ***    Assessment & Plan:    1. Stress incontinence  - offered behavioral therapies, bladder training and bladder control strategies - patient engaged in timed voids  - pelvic floor muscle training - patient would like the referral  - fluid management - encouraged stopping the use of artifical sweeteners and caffeine  2. Urgency  - offered medical therapy with anticholinergic therapy or beta-3 adrenergic receptor agonist and the potential side effects of each therapy   - would like to try the beta-3 adrenergic receptor agonist (Myrbetriq).  Given Myrbetriq 25 mg samples, #28.  I have reviewed with the patient of the side effects of Myrbetriq, such as: elevation in BP, urinary retention and/or HA.    - RTC in 3 weeks for PVR and symptom recheck   3. Cystocele  - see above  4. Vaginal atrophy  - Patient was given a sample of vaginal  estrogen cream (Premarin/Estrace) and instructed to apply 0.5mg  (pea-sized amount)  just inside the vaginal introitus with a finger-tip every night for two weeks and then Monday, Wednesday and Friday nights.  I explained to the patient that vaginally administered estrogen, which causes only a slight increase in the blood estrogen levels, have fewer contraindications and adverse systemic effects that oral HT.  - She will follow up in three months for an exam.    5. Uncontrolled DM  - explained that patiients with DM has a 2.5 fold increase of incontinence - she has actually noticed an improvement with better control of her DM  No Follow-up on file.  These notes generated with voice recognition software. I apologize for typographical errors.  Zara Council, [facility] [address] [place], [place] [address] [phone]

## 2017-04-24 ENCOUNTER — Ambulatory Visit: Payer: Medicare Other | Admitting: Urology

## 2017-04-25 ENCOUNTER — Encounter: Payer: Self-pay | Admitting: Urology

## 2017-05-21 ENCOUNTER — Ambulatory Visit: Payer: Medicare Other | Admitting: Urology

## 2017-05-22 NOTE — Progress Notes (Signed)
05/23/2017 4:14 PM   Judith Shaw 01-Nov-1950 [id number]  Referring provider: Kirk Ruths, MD [facility] [place], [place] [address]  Chief Complaint  Patient presents with  . Urinary Incontinence    HPI: 66 yo diabetic WF with stress urinary incontinence, urgency, cystocele and vaginal atrophy who presents today after a 3 week trial of Myrbetriq.    Background history Patient is a 48 -year-old Caucasian female who is referred to [name] by, Dr. Gurney Maxin, for urinary incontinence.  Patient states that she has had urinary incontinence for two years.   She has noticed a slight improvement over the last two months.  Patient has incontinence with stress, urge and positioning from a sitting to a standing position.   She is experiencing 5 incontinent episodes during the day. She is experiencing several incontinent episodes during the night.  Her incontinence volume is large and soaks through her pads and clothes.  .   She is wearing 6 to 8 pads/depends daily.  She is having associated urinary frequency, urgency, nocturia and intermittency.   She does have a history of urinary tract infections, STI's or injury to the bladder.  She denies dysuria, gross hematuria, suprapubic pain, back pain, abdominal pain or flank pain.  She has not had any recent fevers, chills, nausea or vomiting.  She does/does not have a history of nephrolithiasis, GU surgery or GU trauma.  She states she has her bladder stem stretched as a child.  She does not have a history of nephrolithiasis, GU surgery or GU trauma.  She is post menopausal.  She admits to constipation and/or diarrhea.  She is not having pain with bladder filling.  She has not had any recent imaging studies.  She is drinking  of water daily.   She is drinking several caffeinated beverages daily (artificial sweet ice tea) and Coke zero.  She is not drinking alcoholic beverages daily.  Her risk factors for incontinence are  obesity, a family history of incontinence, age, caffeine, diabetes, depression, fecal incontinence, vaginal atrophy and pelvic surgery.   She is taking opioids, OAB medication, ACE inhibitors, alpha-blocker diuretics and antidepressants.   Her PVR is 51 mL.    At her visit on 02/22/2017, she was initiated on Myrbetriq and vaginal estrogen cream.  A referral was placed for PT.  She was encouraged to keep her BS under control and avoid drinks with artificial sweeteners.    Today, she has been experiencing urgency x 8 or more, frequency x 8 or more, not restricting fluids to avoid visits to the restroom, is engaging in toilet mapping, incontinence x 8 or more and nocturia x 4-7.  Her PVR is 11 mL.  Her BP is 141/75.  She feels that Myrbetriq has provided great relief like to continue the medication.  She denies any gross hematuria, dysuria or suprapubic pain. She has not had fevers, chills, nausea or vomiting.  PMH: Past Medical History:  Diagnosis Date  . CHF (congestive heart failure) ([place])   . Depression   . Diabetes mellitus without complication ([place])   . Hyperlipidemia   . Hypertension     Surgical History: Past Surgical History:  Procedure Laterality Date  . ABDOMINAL HYSTERECTOMY    . CHOLECYSTECTOMY      Home Medications:  Allergies as of 05/23/2017      Reactions   Codeine       Medication List       Accurate  as of 05/23/17  4:14 PM. Always use your most recent med list.          aspirin EC 81 MG tablet Take 81 mg by mouth daily.   carvedilol 3.125 MG tablet Commonly known as:  COREG Take 3.125 mg by mouth 2 (two) times daily with a meal.   clotrimazole-betamethasone cream Commonly known as:  LOTRISONE Apply 1 application topically 2 (two) times daily.   DULoxetine 30 MG capsule Commonly known as:  CYMBALTA Take 30 mg by mouth daily.   FREESTYLE LIBRE READER Devi Use 1 each as directed. Use to check sugars daily. FREE STYLE LIBRE READER E11.65   furosemide  20 MG tablet Commonly known as:  LASIX Take 20 mg every other day in the morning.   HUMALOG KWIKPEN 100 UNIT/ML KiwkPen Generic drug:  insulin lispro Inject 16-22 Units into the skin 3 (three) times daily with meals. Per sliding scale   insulin glargine 100 UNIT/ML injection Commonly known as:  LANTUS Inject 0.42 mLs (42 Units total) into the skin daily.   metFORMIN 500 MG 24 hr tablet Commonly known as:  GLUCOPHAGE-XR Take 1,000 mg by mouth 2 (two) times daily.   mirabegron ER 25 MG Tb24 tablet Commonly known as:  MYRBETRIQ Take 1 tablet (25 mg total) by mouth daily.   ramipril 5 MG capsule Commonly known as:  ALTACE Take 5 mg by mouth daily.   simvastatin 40 MG tablet Commonly known as:  ZOCOR Take 40 mg by mouth every evening.            Discharge Care Instructions        Start     Ordered   05/23/17 0000  Bladder Scan (Post Void Residual) in office     05/23/17 1601   05/23/17 0000  mirabegron ER (MYRBETRIQ) 25 MG TB24 tablet  Daily    Question:  Supervising Provider  Answer:  Hollice Espy   05/23/17 1610      Allergies:  Allergies  Allergen Reactions  . Codeine     Family History: Family History  Problem Relation Age of Onset  . Hypertension Mother   . Heart disease Father   . Prostate cancer Brother   . Kidney cancer Neg Hx   . Bladder Cancer Neg Hx     Social History:  reports that she has never smoked. She has never used smokeless tobacco. She reports that she does not drink alcohol or use drugs.  ROS: UROLOGY Frequent Urination?: Yes Hard to postpone urination?: Yes Burning/pain with urination?: No Get up at night to urinate?: Yes Leakage of urine?: Yes Urine stream starts and stops?: No Trouble starting stream?: No Do you have to strain to urinate?: No Blood in urine?: No Urinary tract infection?: No Sexually transmitted disease?: No Injury to kidneys or bladder?: No Painful intercourse?: No Weak stream?: No Currently  pregnant?: No Vaginal bleeding?: No Last menstrual period?: N  Gastrointestinal Nausea?: No Vomiting?: No Indigestion/heartburn?: No Diarrhea?: No Constipation?: No  Constitutional Fever: No Night sweats?: No Weight loss?: No Fatigue?: No  Skin Skin rash/lesions?: Yes Itching?: Yes  Eyes Blurred vision?: No Double vision?: No  Ears/Nose/Throat Sore throat?: No Sinus problems?: No  Hematologic/Lymphatic Swollen glands?: No Easy bruising?: No  Cardiovascular Leg swelling?: No Chest pain?: No  Respiratory Cough?: No Shortness of breath?: No  Endocrine Excessive thirst?: Yes  Musculoskeletal Back pain?: No Joint pain?: No  Neurological Headaches?: No Dizziness?: No  Psychologic Depression?: No Anxiety?: No  Physical  Exam: BP (!) 141/75 (BP Location: Right Arm, Patient Position: Sitting, Cuff Size: Normal)   Pulse 78   Ht 5\' 5"  (1.651 m)   Wt 251 lb 12.8 oz (114.2 kg)   BMI 41.90 kg/m   Constitutional: Well nourished. Alert and oriented, No acute distress. HEENT: [place] AT, moist mucus membranes. Trachea midline, no masses. Cardiovascular: No clubbing, cyanosis, or edema. Respiratory: Normal respiratory effort, no increased work of breathing. Skin: No rashes, bruises or suspicious lesions. Lymph: No cervical or inguinal adenopathy. Neurologic: Grossly intact, no focal deficits, moving all 4 extremities. Psychiatric: Normal mood and affect.  Laboratory Data: Lab Results  Component Value Date   WBC 9.2 04/09/2017   HGB 14.8 04/09/2017   HCT 44.0 04/09/2017   MCV 88.6 04/09/2017   PLT 264 04/09/2017    Lab Results  Component Value Date   CREATININE 1.28 (H) 04/09/2017    Lab Results  Component Value Date   HGBA1C 10.5 (H) 09/22/2016     Lab Results  Component Value Date   AST 25 04/09/2017   Lab Results  Component Value Date   ALT 18 04/09/2017    I have reviewed the labs.  Pertinent Imaging: Results for SUNDUS, PETE (MRN  [id number]) as of 06/03/2017 16:21  Ref. Range 05/23/2017 16:01  Scan Result Unknown 11      Assessment & Plan:    1. Stress incontinence  - offered behavioral therapies, bladder training and bladder control strategies - patient engaged in timed voids  - pelvic floor muscle training - patient would like to defer referral at this time as she feels the Myrbetriq is controlling her symptoms  - fluid management - encouraged stopping the use of artifical sweeteners and caffeine  2. Urgency  - continue Myrbetriq 25 mg daily - script sent to pharmacy  - RTC in one year for OAB questionnaire and PVR  3. Cystocele  - see above  4. Vaginal atrophy  - Patient not using the vaginal estrogen cream and does not feel that she would like to continue the medication  Return in about 1 year (around 05/23/2018) for PVR and OAB questionnaire.  These notes generated with voice recognition software. I apologize for typographical errors.  Zara Council, [facility] [address], [place], [place] [address] [phone]

## 2017-05-23 ENCOUNTER — Encounter: Payer: Self-pay | Admitting: Urology

## 2017-05-23 ENCOUNTER — Ambulatory Visit: Payer: Medicare Other | Admitting: Urology

## 2017-05-23 VITALS — BP 141/75 | HR 78 | Ht 65.0 in | Wt 251.8 lb

## 2017-05-23 DIAGNOSIS — N393 Stress incontinence (female) (male): Secondary | ICD-10-CM

## 2017-05-23 DIAGNOSIS — R3915 Urgency of urination: Secondary | ICD-10-CM | POA: Diagnosis not present

## 2017-05-23 DIAGNOSIS — N952 Postmenopausal atrophic vaginitis: Secondary | ICD-10-CM | POA: Diagnosis not present

## 2017-05-23 DIAGNOSIS — N8111 Cystocele, midline: Secondary | ICD-10-CM | POA: Diagnosis not present

## 2017-05-23 LAB — BLADDER SCAN AMB NON-IMAGING: Scan Result: 11

## 2017-05-23 MED ORDER — MIRABEGRON ER 25 MG PO TB24: 25.0000 mg | ORAL_TABLET | Freq: Every day | ORAL | 11 refills | Status: DC

## 2017-07-07 IMAGING — US US EXTREM LOW VENOUS*L*
1 series · 13 of 24 positions shown · non-contrast
Comparison: [DATE]

CLINICAL DATA: Left leg swelling, discoloration, redness



[Series 1: us extrem low venous*left* · 0.09mm/px · 13 of 34 slices shown]
[im 1/34]
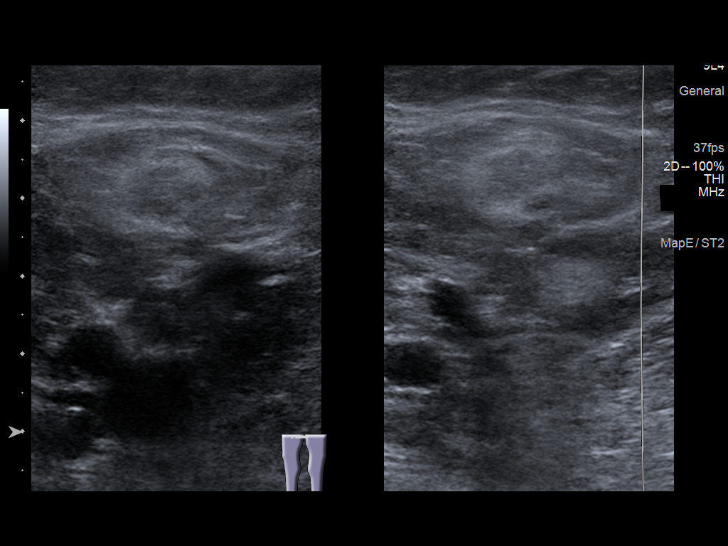
[im 3/34]
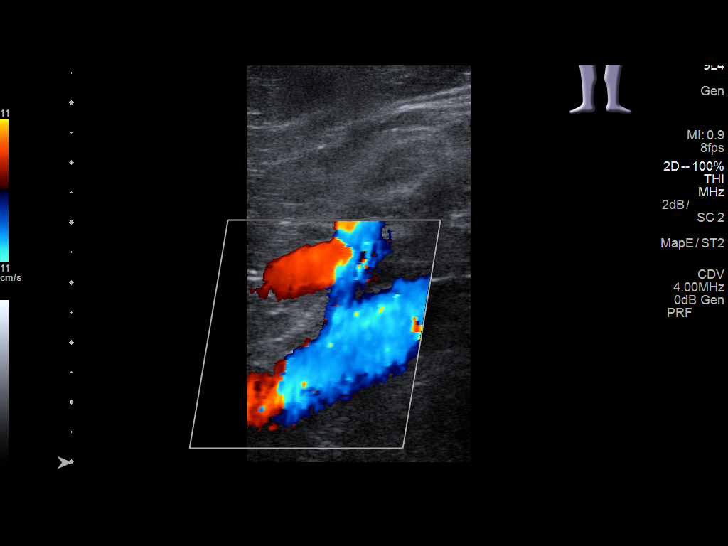
[im 6/34]
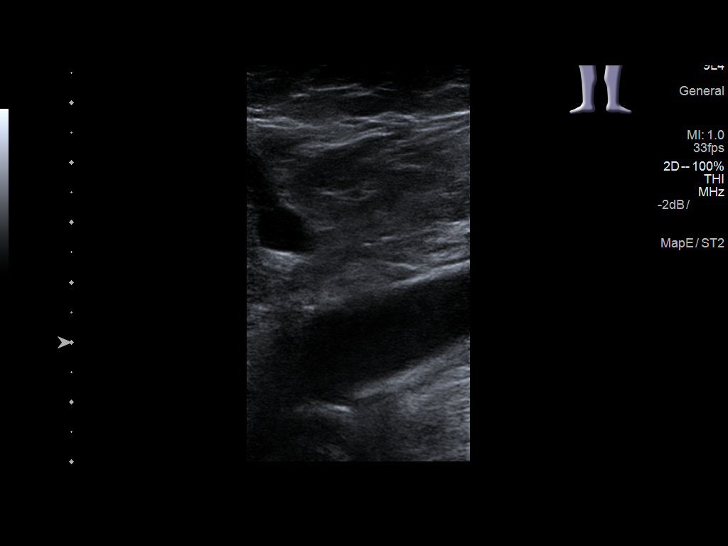
[im 9/34]
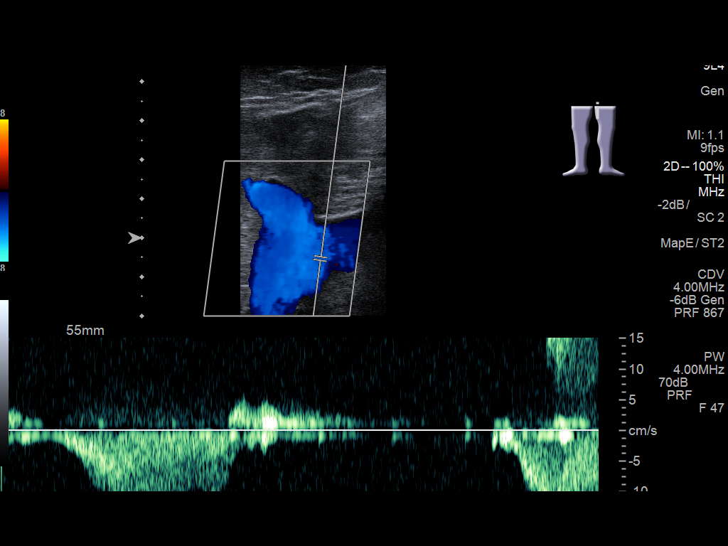
[im 12/34]
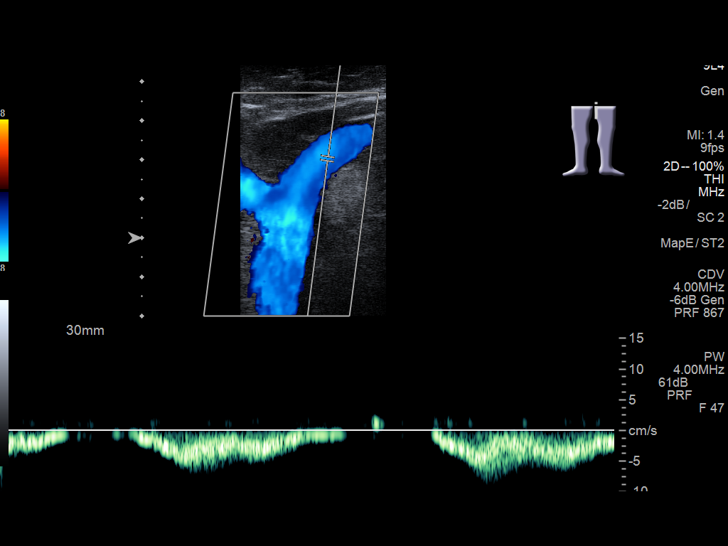
[im 15/34]
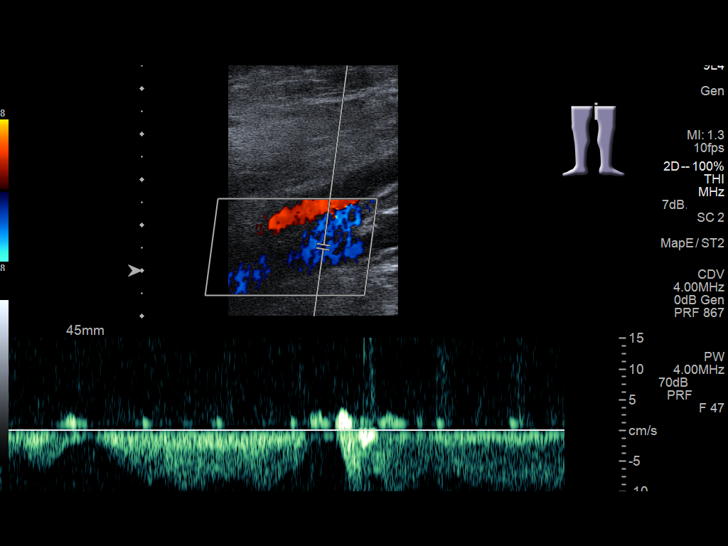
[im 18/34]
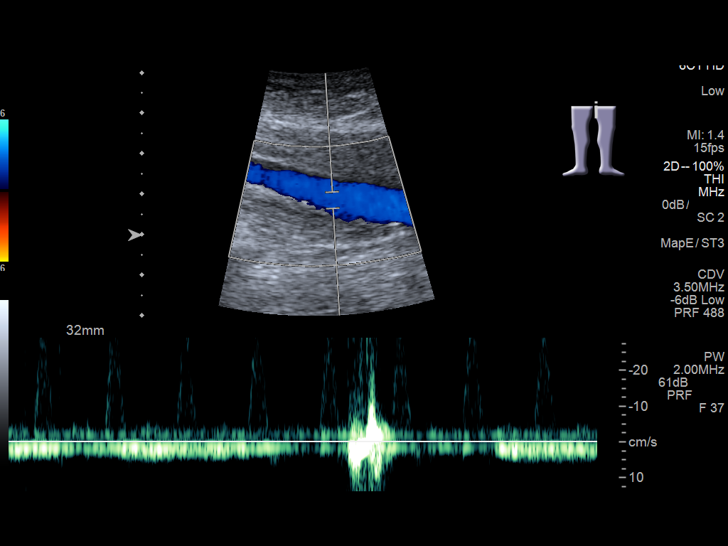
[im 19/34]
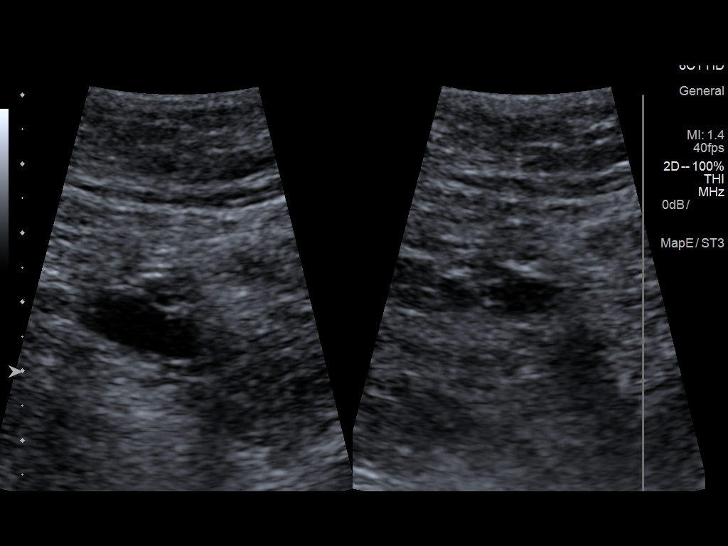
[im 22/34]
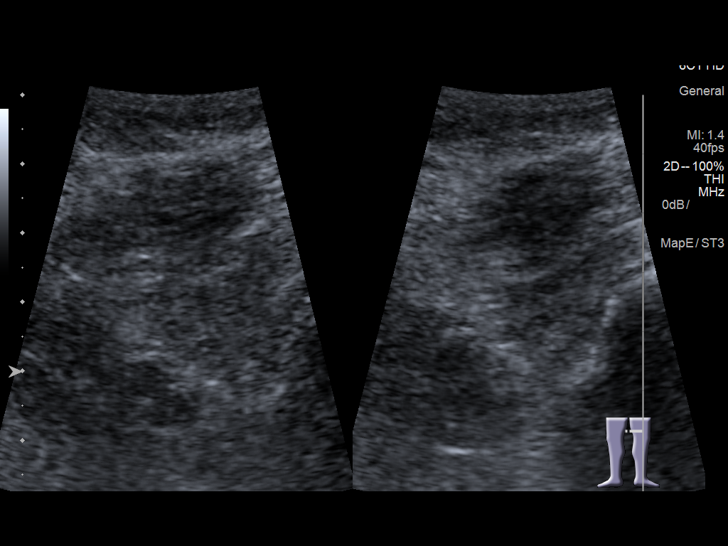
[im 25/34]
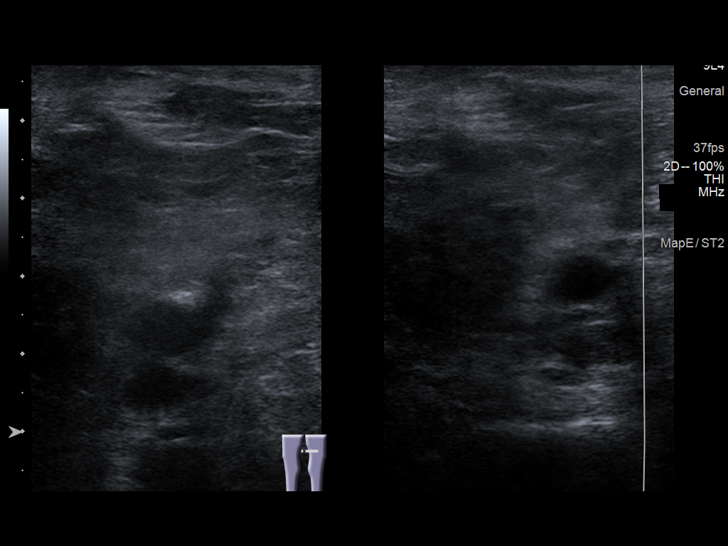
[im 28/34]
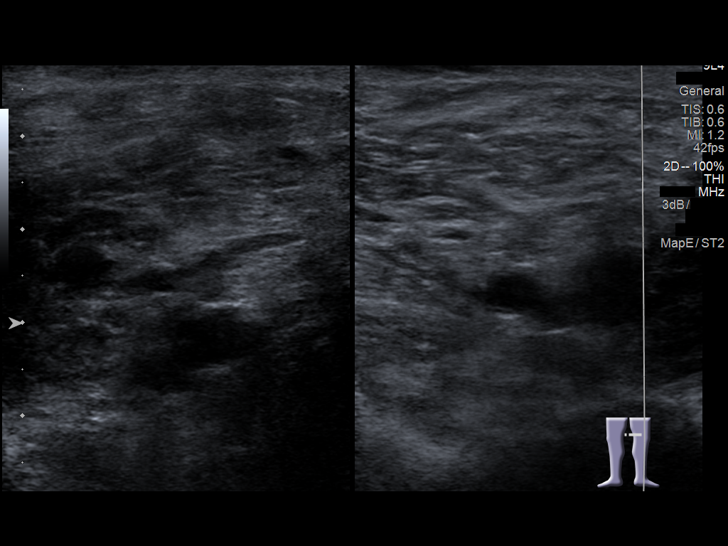
[im 31/34]
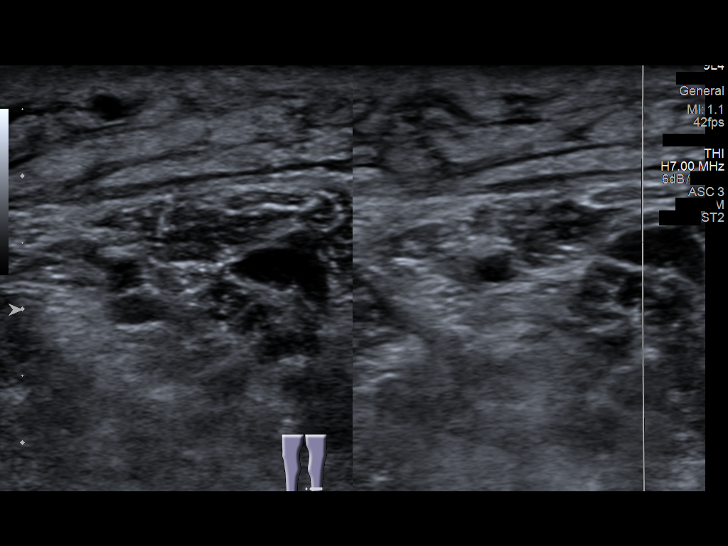
[im 34/34]
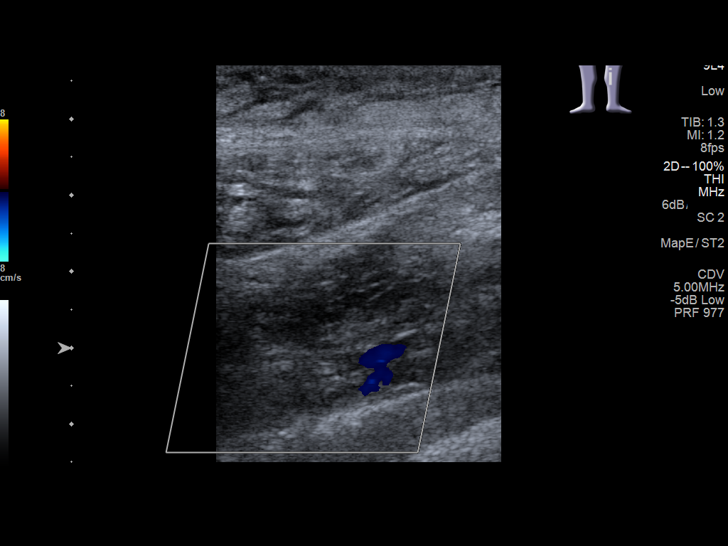

[13 of 24 positions shown; findings below may reference images not displayed]

FINDINGS: Contralateral Common Femoral Vein: Respiratory phasicity is normal
and symmetric with the symptomatic side. No evidence of thrombus.
Normal compressibility.

Common Femoral Vein: No evidence of thrombus. Normal
compressibility, respiratory phasicity and response to augmentation.

Saphenofemoral Junction: No evidence of thrombus. Normal
compressibility and flow on color Doppler imaging.

Profunda Femoral Vein: No evidence of thrombus. Normal
compressibility and flow on color Doppler imaging.

Femoral Vein: No evidence of thrombus. Normal compressibility,
respiratory phasicity and response to augmentation.

Popliteal Vein: No evidence of thrombus. Normal compressibility,
respiratory phasicity and response to augmentation.

Calf Veins: No evidence of thrombus. Normal compressibility and flow
on color Doppler imaging.

Superficial Great Saphenous Vein: No evidence of thrombus. Normal
compressibility.

Venous Reflux:  None.

Other Findings:  None.
IMPRESSION: No evidence of deep venous thrombosis.

## 2017-07-23 ENCOUNTER — Ambulatory Visit
Admission: RE | Admit: 2017-07-23 | Discharge: 2017-07-23 | Disposition: A | Discharge status: Home / Self Care | Payer: Medicare Other | Source: Ambulatory Visit | Attending: Family Medicine | Admitting: Family Medicine

## 2017-07-23 ENCOUNTER — Other Ambulatory Visit: Payer: Self-pay | Admitting: Family Medicine

## 2017-07-23 DIAGNOSIS — M7989 Other specified soft tissue disorders: Secondary | ICD-10-CM

## 2017-09-11 ENCOUNTER — Other Ambulatory Visit: Payer: Self-pay | Admitting: Internal Medicine

## 2018-01-11 ENCOUNTER — Ambulatory Visit
Admission: RE | Admit: 2018-01-11 | Discharge: 2018-01-11 | Disposition: A | Discharge status: Home / Self Care | Payer: Medicare Other | Source: Ambulatory Visit | Attending: Physician Assistant | Admitting: Physician Assistant

## 2018-01-11 ENCOUNTER — Other Ambulatory Visit: Payer: Self-pay | Admitting: Physician Assistant

## 2018-01-11 DIAGNOSIS — R1032 Left lower quadrant pain: Secondary | ICD-10-CM

## 2018-01-11 DIAGNOSIS — I700 Atherosclerosis of aorta: Secondary | ICD-10-CM | POA: Diagnosis not present

## 2018-01-11 DIAGNOSIS — Z9049 Acquired absence of other specified parts of digestive tract: Secondary | ICD-10-CM | POA: Diagnosis not present

## 2018-01-11 DIAGNOSIS — K760 Fatty (change of) liver, not elsewhere classified: Secondary | ICD-10-CM | POA: Diagnosis not present

## 2018-01-11 DIAGNOSIS — Z90710 Acquired absence of both cervix and uterus: Secondary | ICD-10-CM | POA: Insufficient documentation

## 2018-01-11 HISTORY — DX: Malignant (primary) neoplasm, unspecified: C80.1

## 2018-01-11 IMAGING — CT CT ABD-PELV W/ CM
2 of 5 series · 16 of 46 positions shown, 18 images · IV contrast (APPLIED)
Comparison: CT, [DATE]

CLINICAL DATA: Left lower quadrant and left upper abdominal pain
for 1 day. Nausea this morning. History of uterine carcinoma.

EXAM:
CT ABDOMEN AND PELVIS WITH CONTRAST
TECHNIQUE: Multidetector CT imaging of the abdomen and pelvis was performed
using the standard protocol following bolus administration of
intravenous contrast.
CONTRAST:  100mL [LH] IOPAMIDOL ([LH]) INJECTION 61%

[Series 2: routine abd/pel with · axial · 0.98mm/px · z∈[-1031,-576]mm · 13 of 103 slices shown, 15 images]
[im 6/103  soft-tissue]
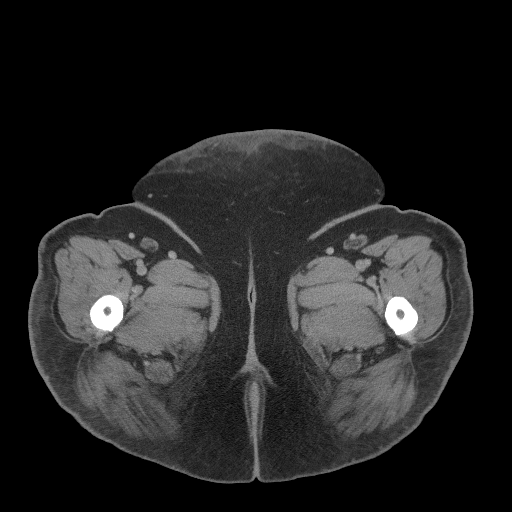
[im 6/103  bone]
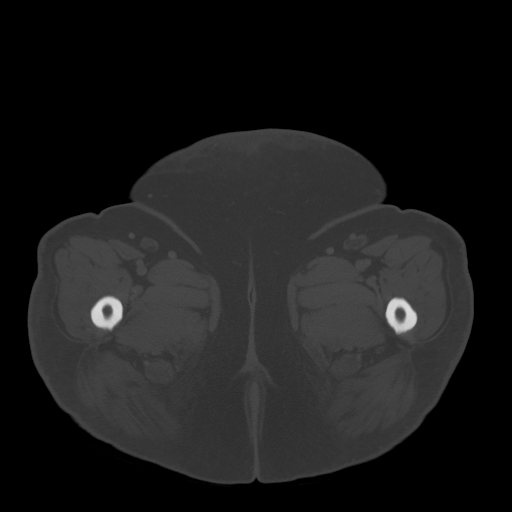
[im 12/103  soft-tissue]
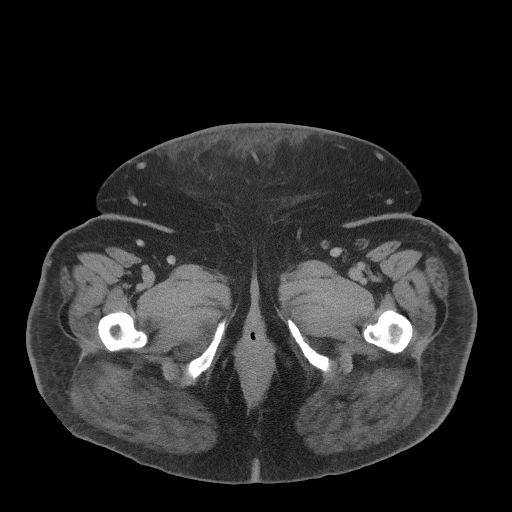
[im 23/103  soft-tissue]
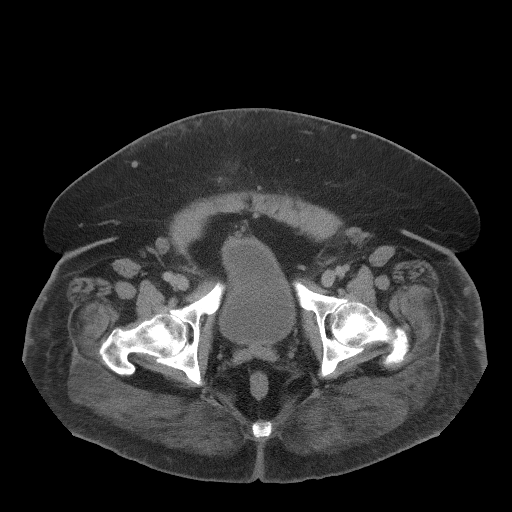
[im 29/103  soft-tissue]
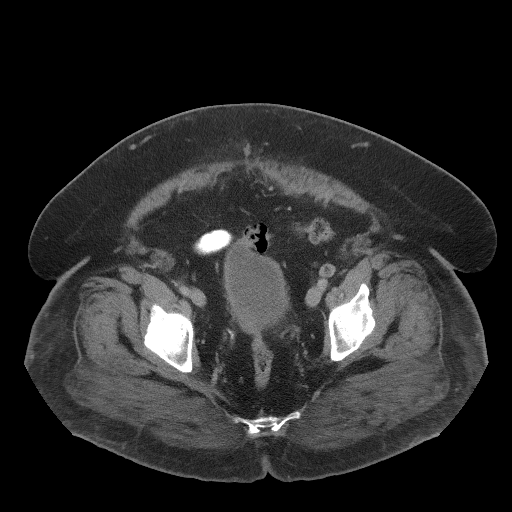
[im 35/103  soft-tissue]
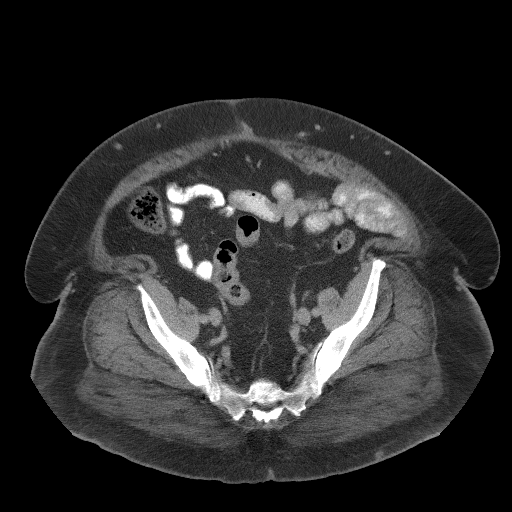
[im 46/103  soft-tissue]
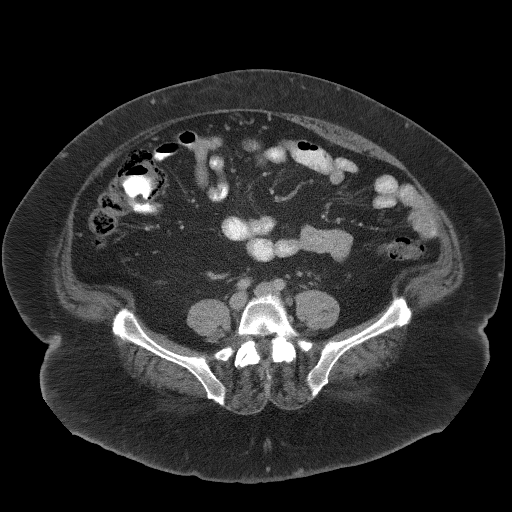
[im 52/103  soft-tissue]
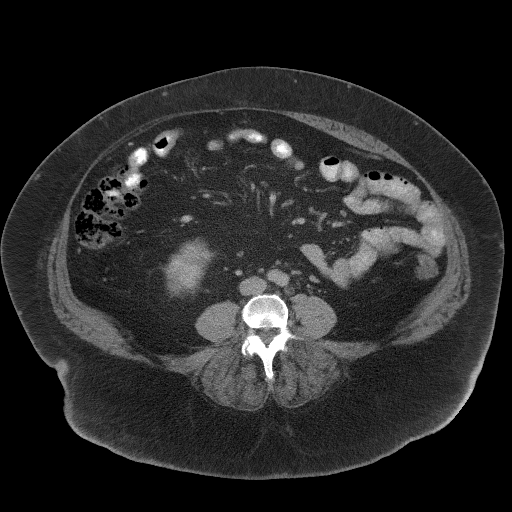
[im 57/103  soft-tissue]
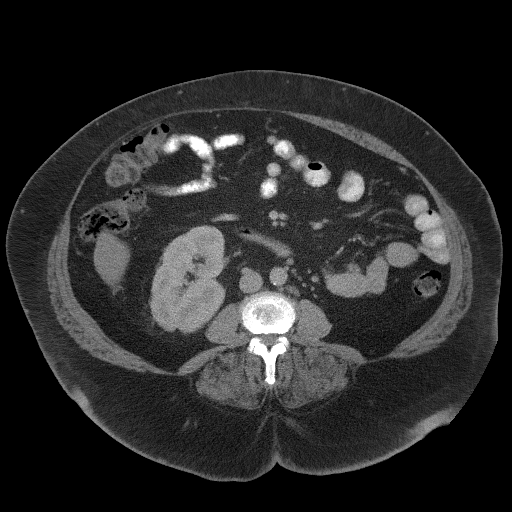
[im 69/103  soft-tissue]
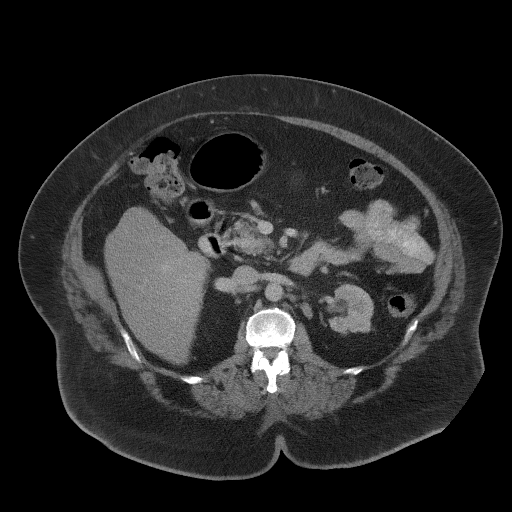
[im 69/103  bone]
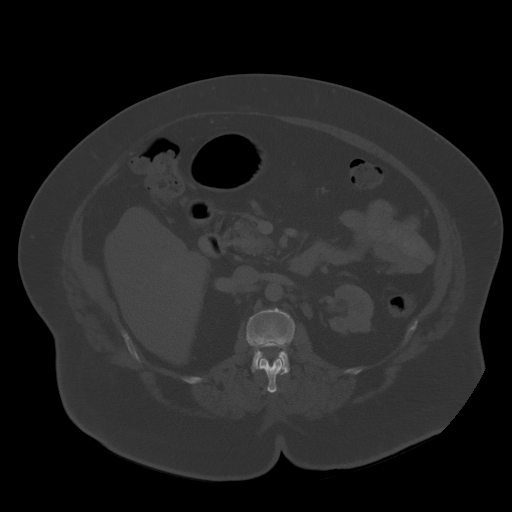
[im 74/103  soft-tissue]
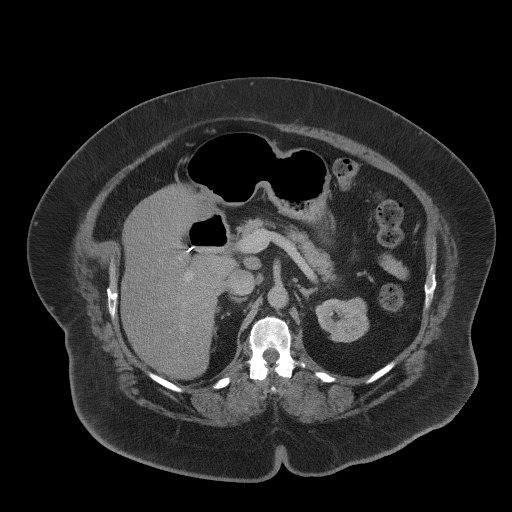
[im 80/103  soft-tissue]
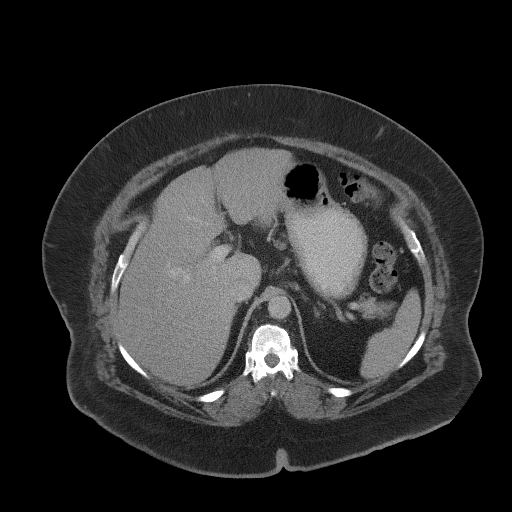
[im 91/103  soft-tissue]
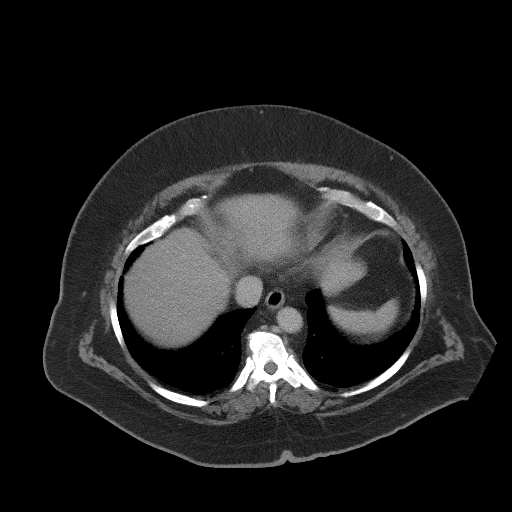
[im 97/103  soft-tissue]
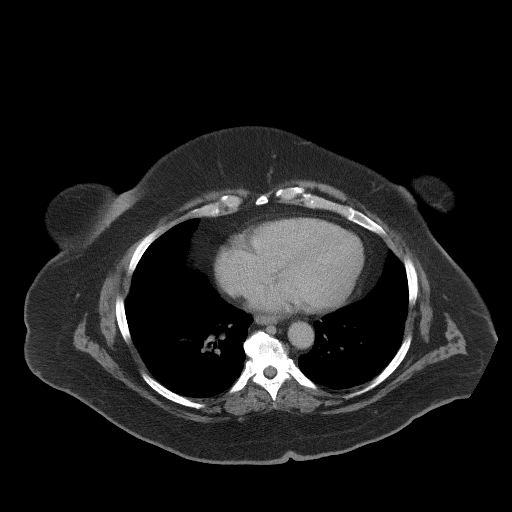

[Series 6: coronal st · coronal · 0.96mm/px · 3 of 136 slices shown]
[im 46/136  soft-tissue]
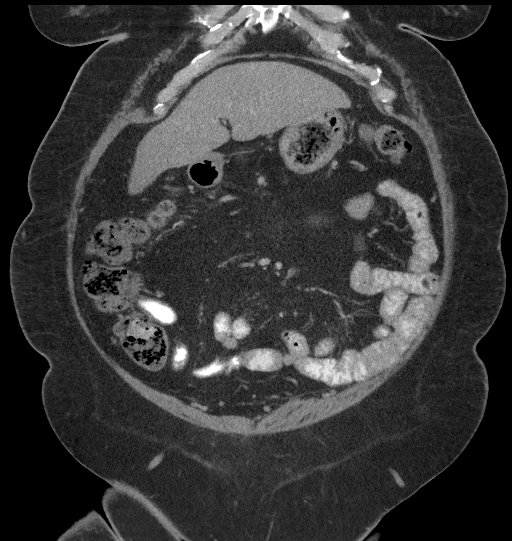
[im 61/136  soft-tissue]
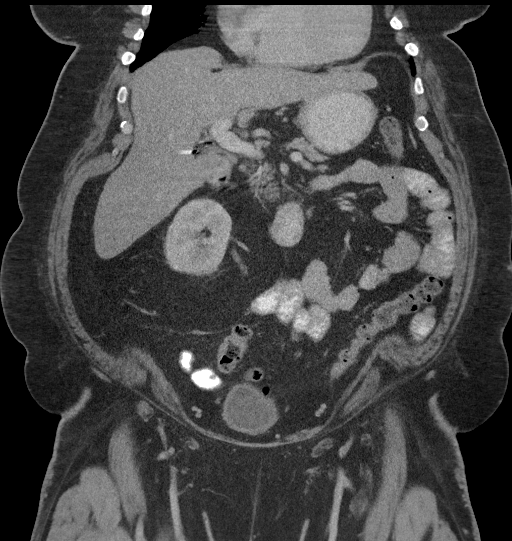
[im 76/136  soft-tissue]
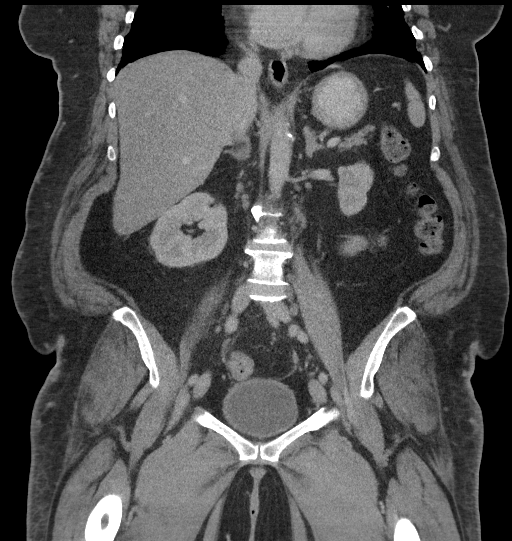

[16 of 46 positions shown; findings below may reference images not displayed]

FINDINGS: Lower chest: No acute abnormality.

Hepatobiliary: Diffuse decreased attenuation of the liver. No liver
mass or focal lesion. Status post cholecystectomy. No bile duct
dilation

Pancreas: Unremarkable. No pancreatic ductal dilatation or
surrounding inflammatory changes.

Spleen: Normal in size without focal abnormality.

Adrenals/Urinary Tract: No adrenal masses.

Multiple areas of left renal scarring with a lobulated renal contour
and decreased overall renal size. Right kidney is normal in size and
configuration. No renal masses, no stones and no hydronephrosis.
Ureters are normal in course and in caliber. Bladder is
unremarkable.

Stomach/Bowel: Stomach, small bowel and colon normal in caliber.
There is no wall thickening. There are no inflammatory changes.
Normal appendix is visualized.

Vascular/Lymphatic: There are several prominent lymph nodes along
the gastrohepatic ligament, largest measuring 1 cm short axis, all
stable from the prior CT. No other adenopathy. Mild aortic
atherosclerotic calcifications.

Reproductive: Status post hysterectomy. No adnexal masses.

Other: No abdominal wall hernia. There is increased density in the
subcutaneous fat the anterior lower abdominal wall initiated mild
skin thickening consistent with edema or pannus. No ascites.

Musculoskeletal: No fracture or acute finding. No osteoblastic or
osteolytic lesions.
IMPRESSION: 1. No acute findings. No findings to account the patient's left
upper and lower quadrant abdominal pain. Specifically, no evidence
of diverticulitis or other bowel inflammation.
2. Hepatic steatosis.
3. Status post cholecystectomy and hysterectomy.
4. Minor aortic atherosclerosis.

## 2018-01-11 MED ORDER — IOPAMIDOL (ISOVUE-300) INJECTION 61%
100.0000 mL | Freq: Once | INTRAVENOUS | Status: AC | PRN
  Administered 2018-01-11: 100 mL via INTRAVENOUS

## 2018-04-09 ENCOUNTER — Other Ambulatory Visit: Payer: Self-pay | Admitting: Obstetrics and Gynecology

## 2018-04-09 DIAGNOSIS — Z1231 Encounter for screening mammogram for malignant neoplasm of breast: Secondary | ICD-10-CM

## 2018-05-01 DIAGNOSIS — E1121 Type 2 diabetes mellitus with diabetic nephropathy: Secondary | ICD-10-CM | POA: Insufficient documentation

## 2018-05-01 DIAGNOSIS — Z794 Long term (current) use of insulin: Secondary | ICD-10-CM | POA: Insufficient documentation

## 2018-05-15 DIAGNOSIS — Z86010 Personal history of colonic polyps: Secondary | ICD-10-CM | POA: Insufficient documentation

## 2018-05-15 DIAGNOSIS — K760 Fatty (change of) liver, not elsewhere classified: Secondary | ICD-10-CM | POA: Insufficient documentation

## 2018-05-22 ENCOUNTER — Ambulatory Visit (INDEPENDENT_AMBULATORY_CARE_PROVIDER_SITE_OTHER): Payer: Medicare Other | Admitting: Urology

## 2018-05-22 ENCOUNTER — Encounter: Payer: Self-pay | Admitting: Urology

## 2018-05-22 VITALS — BP 127/75 | HR 76 | Ht 64.0 in | Wt 266.7 lb

## 2018-05-22 DIAGNOSIS — N393 Stress incontinence (female) (male): Secondary | ICD-10-CM

## 2018-05-22 DIAGNOSIS — N952 Postmenopausal atrophic vaginitis: Secondary | ICD-10-CM

## 2018-05-22 DIAGNOSIS — N8111 Cystocele, midline: Secondary | ICD-10-CM | POA: Diagnosis not present

## 2018-05-22 DIAGNOSIS — R3915 Urgency of urination: Secondary | ICD-10-CM

## 2018-05-22 LAB — BLADDER SCAN AMB NON-IMAGING: SCAN RESULT: 0

## 2018-05-22 MED ORDER — MIRABEGRON ER 25 MG PO TB24: 25.0000 mg | ORAL_TABLET | Freq: Every day | ORAL | 11 refills | Status: DC

## 2018-05-22 NOTE — Progress Notes (Signed)
05/22/2018 4:33 PM   Judith Shaw 1951-04-01 [id number]  Referring provider: Kirk Ruths, MD [facility] [place], [place] [address]  Chief Complaint  Patient presents with  . Follow-up    HPI: 67 yo diabetic WF with stress urinary incontinence, urgency, cystocele and vaginal atrophy who presents today for a follow up.     Background history Patient is a 8 -year-old Caucasian female who is referred to [name] by, Dr. Gurney Maxin, for urinary incontinence.  Patient states that she has had urinary incontinence for two years.   She has noticed a slight improvement over the last two months.  Patient has incontinence with stress, urge and positioning from a sitting to a standing position.   She is experiencing 5 incontinent episodes during the day. She is experiencing several incontinent episodes during the night.  Her incontinence volume is large and soaks through her pads and clothes.  .   She is wearing 6 to 8 pads/depends daily.  She is having associated urinary frequency, urgency, nocturia and intermittency.   She does have a history of urinary tract infections, STI's or injury to the bladder.  She denies dysuria, gross hematuria, suprapubic pain, back pain, abdominal pain or flank pain.  She has not had any recent fevers, chills, nausea or vomiting.  She does not have a history of nephrolithiasis, GU surgery or GU trauma.  She states she has her bladder stem stretched as a child.  She does not have a history of nephrolithiasis, GU surgery or GU trauma.  She is post menopausal.  She admits to constipation and/or diarrhea.  She is not having pain with bladder filling.  She has not had any recent imaging studies.  She is drinking  of water daily.   She is drinking several caffeinated beverages daily (artificial sweet ice tea) and Coke zero.  She is not drinking alcoholic beverages daily.  Her risk factors for incontinence are obesity, a family history of  incontinence, age, caffeine, diabetes, depression, fecal incontinence, vaginal atrophy and pelvic surgery.   She is taking opioids, OAB medication, ACE inhibitors, alpha-blocker diuretics and antidepressants.   Her PVR is 51 mL.    At her visit on 02/22/2017, she was initiated on Myrbetriq and vaginal estrogen cream.  A referral was placed for PT.  She was encouraged to keep her BS under control and avoid drinks with artificial sweeteners.    Today, she has been experiencing urgency x 4-7 (improved), frequency x 8 or more (unchanged), is restricting fluids to avoid visits to the restroom, is engaging in toilet mapping, incontinence x 8 or more (unchanged) and nocturia x 4-7 (unchanged).  Her PVR is 0 mL.  Her BP is 127/75.    She denies any gross hematuria, dysuria or suprapubic pain. She has not had fevers, chills, nausea or vomiting.  Her last HgbA1c 14%.    PMH: Past Medical History:  Diagnosis Date  . Cancer ([place])    pt states hx of uterine cancer and had a complete hyst  . CHF (congestive heart failure) ([place])   . Depression   . Diabetes mellitus without complication ([place])   . Hyperlipidemia   . Hypertension     Surgical History: Past Surgical History:  Procedure Laterality Date  . ABDOMINAL HYSTERECTOMY    . CHOLECYSTECTOMY      Home Medications:  Allergies as of 05/22/2018      Reactions   Codeine  Medication List        Accurate as of 05/22/18  4:33 PM. Always use your most recent med list.          aspirin EC 81 MG tablet Take 81 mg by mouth daily.   carvedilol 3.125 MG tablet Commonly known as:  COREG Take 3.125 mg by mouth 2 (two) times daily with a meal.   clotrimazole-betamethasone cream Commonly known as:  LOTRISONE Apply 1 application topically 2 (two) times daily.   DULoxetine 30 MG capsule Commonly known as:  CYMBALTA Take 30 mg by mouth daily.   FREESTYLE LIBRE READER Devi Use 1 each as directed. Use to check sugars daily. FREE STYLE LIBRE  READER E11.65   furosemide 20 MG tablet Commonly known as:  LASIX Take 20 mg every other day in the morning.   HUMULIN R U-500 KWIKPEN 500 UNIT/ML kwikpen Generic drug:  insulin regular human CONCENTRATED Inject into the skin.   metFORMIN 500 MG 24 hr tablet Commonly known as:  GLUCOPHAGE-XR Take 1,000 mg by mouth 2 (two) times daily.   mirabegron ER 25 MG Tb24 tablet Commonly known as:  MYRBETRIQ Take 1 tablet (25 mg total) by mouth daily.   ramipril 5 MG capsule Commonly known as:  ALTACE Take 5 mg by mouth daily.   simvastatin 40 MG tablet Commonly known as:  ZOCOR Take 40 mg by mouth every evening.       Allergies:  Allergies  Allergen Reactions  . Codeine     Family History: Family History  Problem Relation Age of Onset  . Hypertension Mother   . Heart disease Father   . Prostate cancer Brother   . Kidney cancer Neg Hx   . Bladder Cancer Neg Hx     Social History:  reports that she has never smoked. She has never used smokeless tobacco. She reports that she does not drink alcohol or use drugs.  ROS: UROLOGY Frequent Urination?: Yes Hard to postpone urination?: Yes Burning/pain with urination?: No Get up at night to urinate?: Yes Leakage of urine?: Yes Urine stream starts and stops?: Yes Trouble starting stream?: No Do you have to strain to urinate?: Yes Blood in urine?: No Urinary tract infection?: No Sexually transmitted disease?: No Injury to kidneys or bladder?: No Painful intercourse?: No Weak stream?: No Currently pregnant?: No Vaginal bleeding?: No Last menstrual period?: n  Gastrointestinal Nausea?: No Vomiting?: No Indigestion/heartburn?: No Diarrhea?: No Constipation?: No  Constitutional Fever: No Night sweats?: No Weight loss?: No Fatigue?: Yes  Skin Skin rash/lesions?: Yes Itching?: Yes  Eyes Blurred vision?: Yes Double vision?: No  Ears/Nose/Throat Sore throat?: No Sinus problems?:  Yes  Hematologic/Lymphatic Swollen glands?: No Easy bruising?: No  Cardiovascular Leg swelling?: Yes Chest pain?: No  Respiratory Cough?: Yes Shortness of breath?: Yes  Endocrine Excessive thirst?: Yes  Musculoskeletal Back pain?: No Joint pain?: Yes  Neurological Headaches?: Yes Dizziness?: No  Psychologic Depression?: Yes Anxiety?: No  Physical Exam: BP 127/75 (BP Location: Left Arm, Patient Position: Sitting, Cuff Size: Large)   Pulse 76   Ht 5\' 4"  (1.626 m)   Wt 266 lb 11.2 oz (121 kg)   BMI 45.78 kg/m   Constitutional: Well nourished. Alert and oriented, No acute distress. HEENT: [place] AT, moist mucus membranes. Trachea midline, no masses. Cardiovascular: No clubbing, cyanosis, or edema. Respiratory: Normal respiratory effort, no increased work of breathing. Skin: No rashes, bruises or suspicious lesions. Lymph: No cervical or inguinal adenopathy. Neurologic: Grossly intact, no focal deficits,  moving all 4 extremities. Psychiatric: Normal mood and affect.  Laboratory Data: Lab Results  Component Value Date   WBC 9.2 04/09/2017   HGB 14.8 04/09/2017   HCT 44.0 04/09/2017   MCV 88.6 04/09/2017   PLT 264 04/09/2017    Lab Results  Component Value Date   CREATININE 1.28 (H) 04/09/2017    Lab Results  Component Value Date   HGBA1C 10.5 (H) 09/22/2016     Lab Results  Component Value Date   AST 25 04/09/2017   Lab Results  Component Value Date   ALT 18 04/09/2017    I have reviewed the labs.  Pertinent Imaging: Results for OVEDA, DADAMO (MRN [id number]) as of 05/30/2018 17:25  Ref. Range 05/22/2018 16:19  Scan Result Unknown 0    Assessment & Plan:    1. Stress incontinence Feels that she is managing her stress incontinence with the use of incontinence pads and Myrbetriq She does not desire any further intervention or work-up at this time She return in 1 year for OAB questionnaire and PVR  2. Urgency Continue Myrbetriq 25 mg daily -  script sent to pharmacy RTC in one year for OAB questionnaire and PVR  3. Cystocele see above  4. Vaginal atrophy Patient not using the vaginal estrogen cream   Return in about 1 year (around 05/23/2019) for PVR and OAB questionnaire.  These notes generated with voice recognition software. I apologize for typographical errors.  Zara Council, PA-C  [facility] [address] [place], [place] [address] [phone]

## 2018-07-02 ENCOUNTER — Ambulatory Visit: Payer: Medicare Other | Admitting: Dietician

## 2018-07-05 ENCOUNTER — Encounter: Payer: Medicare Other | Attending: Nurse Practitioner | Admitting: *Deleted

## 2018-07-05 ENCOUNTER — Encounter: Payer: Self-pay | Admitting: *Deleted

## 2018-07-05 VITALS — BP 134/80 | Ht 64.0 in | Wt 267.2 lb

## 2018-07-05 DIAGNOSIS — E1169 Type 2 diabetes mellitus with other specified complication: Secondary | ICD-10-CM | POA: Insufficient documentation

## 2018-07-05 DIAGNOSIS — Z713 Dietary counseling and surveillance: Secondary | ICD-10-CM | POA: Diagnosis not present

## 2018-07-05 DIAGNOSIS — E1165 Type 2 diabetes mellitus with hyperglycemia: Secondary | ICD-10-CM

## 2018-07-05 DIAGNOSIS — Z794 Long term (current) use of insulin: Secondary | ICD-10-CM

## 2018-07-05 NOTE — Patient Instructions (Addendum)
Check blood sugars 4 x day before each meal and before bed every day and as needed with [facility] sensor Bring blood sugar records to the next appointment  Exercise: Begin walking for  5-10  minutes   3 days a week and gradually increase to 150 minutes/week  Eat 3 meals day, 2 snacks a day Space meals 4-6 hours apart Don't skip meals Limit fried foods Complete 3 Day Food Record and bring to next appt  Carry fast acting glucose and a snack at all times Rotate injection sites  Return for appointment on: Monday July 22, 2018 at 1:30 pm with [facility] (dietitian)

## 2018-07-05 NOTE — Progress Notes (Signed)
Diabetes Self-Management Education  Visit Type: First/Initial  Appt. Start Time: 1345 Appt. End Time: 1510  07/05/2018  [name], identified by name and date of birth, is a 67 y.o. female with a diagnosis of Diabetes: Type 2.   ASSESSMENT  Blood pressure 134/80, height 5\' 4"  (1.626 m), weight 267 lb 3.2 oz (121.2 kg). Body mass index is 45.86 kg/m.  Diabetes Self-Management Education - 07/05/18 1543      Visit Information   Visit Type  First/Initial      Initial Visit   Diabetes Type  Type 2    Are you currently following a meal plan?  No    Are you taking your medications as prescribed?  Yes    Date Diagnosed  10-12 years ago      Health Coping   How would you rate your overall health?  Fair      Psychosocial Assessment   Patient Belief/Attitude about Diabetes  Motivated to manage diabetes   "frustration"   Self-care barriers  Unsteady gait/risk for falls    Self-management support  Doctor's office;Friends;Family    Patient Concerns  Nutrition/Meal planning;Medication;Monitoring;Healthy Lifestyle;Problem Solving;Glycemic Control;Weight Control    Special Needs  None    Preferred Learning Style  Visual;Auditory    Learning Readiness  Ready    How often do you need to have someone help you when you read instructions, pamphlets, or other written materials from your doctor or pharmacy?  1 - Never    What is the last grade level you completed in school?  Bachelors      Pre-Education Assessment   Patient understands the diabetes disease and treatment process.  Needs Review    Patient understands incorporating nutritional management into lifestyle.  Needs Instruction    Patient undertands incorporating physical activity into lifestyle.  Needs Instruction    Patient understands using medications safely.  Needs Review    Patient understands monitoring blood glucose, interpreting and using results  Needs Review    Patient understands prevention, detection, and treatment of  acute complications.  Needs Review    Patient understands prevention, detection, and treatment of chronic complications.  Needs Review    Patient understands how to develop strategies to address psychosocial issues.  Needs Review    Patient understands how to develop strategies to promote health/change behavior.  Needs Review      Complications   Last HgB A1C per patient/outside source  13.9 %   06/14/18   How often do you check your blood sugar?  > 4 times/day   Pt has FreeStyle Libre.    Fasting Blood glucose range (mg/dL)  130-179;180-200;>200   Pt reports readings from 6 am-12 noon occasionally range from 150-250 mg/dL  but usually 300-450 mg/dL.    Postprandial Blood glucose range (mg/dL)  >200   Average for last 7 days was 338 mg/dL.    Have you had a dilated eye exam in the past 12 months?  Yes    Have you had a dental exam in the past 12 months?  Yes    Are you checking your feet?  Yes    How many days per week are you checking your feet?  7      Dietary Intake   Breakfast  Eats out for most meals  (10am -1 pm) breakfast is low carb platter from [employer] or oatmeal    Lunch  nuts as snack - skips most days because breakfast is late    [employer]  grilled chicken, beef, pork, fried fish with potatoes, peas, green beans, pinto beans, salad, broccoli, cauliflower    Beverage(s)  water, unsweetened tea      Exercise   Exercise Type  ADL's      Patient Education   Previous Diabetes Education  Yes (please comment)   classes 10-12 years ago   Disease state   Explored patient's options for treatment of their diabetes    Nutrition management   Role of diet in the treatment of diabetes and the relationship between the three main macronutrients and blood glucose level;Carbohydrate counting;Reviewed blood glucose goals for pre and post meals and how to evaluate the patients' food intake on their blood glucose level.    Physical activity and exercise   Role of exercise on diabetes  management, blood pressure control and cardiac health.    Medications  Taught/reviewed insulin injection, site rotation, insulin storage and needle disposal.;Reviewed patients medication for diabetes, action, purpose, timing of dose and side effects.    Monitoring  Taught/discussed recording of test results and interpretation of SMBG.;Identified appropriate SMBG and/or A1C goals.    Acute complications  Taught treatment of hypoglycemia - the 15 rule.    Chronic complications  Relationship between chronic complications and blood glucose control    Psychosocial adjustment  Identified and addressed patients feelings and concerns about diabetes      Individualized Goals (developed by patient)   Reducing Risk Improve blood sugars Decrease medications Prevent diabetes complications Lose weight Lead a healthier lifestyle Become more fit Improving mental clarity - alertness     Outcomes   Expected Outcomes  Demonstrated interest in learning. Expect positive outcomes    Future DMSE  2 wks       Individualized Plan for Diabetes Self-Management Training:   Learning Objective:  Patient will have a greater understanding of diabetes self-management. Patient education plan is to attend individual and/or group sessions per assessed needs and concerns.   Plan:   Patient Instructions  Check blood sugars 4 x day before each meal and before bed every day and as needed with [facility] sensor Bring blood sugar records to the next appointment Exercise: Begin walking for  5-10  minutes   3 days a week and gradually increase to 150 minutes/week Eat 3 meals day, 2 snacks a day Space meals 4-6 hours apart Don't skip meals Limit fried foods Complete 3 Day Food Record and bring to next appt Carry fast acting glucose and a snack at all times Rotate injection sites Return for appointment on: Monday July 22, 2018 at 1:30 pm with Pam (dietitian)  Expected Outcomes:  Demonstrated interest in learning. Expect  positive outcomes  Education material provided:  General Meal Planning Guidelines Simple Meal Plan 3 Day Food Record Glucose tablets Symptoms, causes and treatments of Hypoglycemia  If problems or questions, patient to contact team via:   Johny Drilling, [place], [place], CDE [phone]  Future DSME appointment: 2 wks  July 22, 2018 with the dietitian

## 2018-07-19 DIAGNOSIS — I10 Essential (primary) hypertension: Secondary | ICD-10-CM | POA: Insufficient documentation

## 2018-07-22 ENCOUNTER — Ambulatory Visit: Payer: Medicare Other | Admitting: Dietician

## 2018-08-07 ENCOUNTER — Telehealth: Payer: Self-pay | Admitting: Dietician

## 2018-08-07 ENCOUNTER — Encounter: Admission: RE | Payer: Self-pay | Source: Ambulatory Visit

## 2018-08-07 ENCOUNTER — Ambulatory Visit
Admission: RE | Admit: 2018-08-07 | Payer: Medicare Other | Source: Ambulatory Visit | Admitting: Unknown Physician Specialty

## 2018-08-07 SURGERY — COLONOSCOPY WITH PROPOFOL
Anesthesia: General

## 2018-08-07 NOTE — Telephone Encounter (Signed)
Called patient to reschedule her missed appointment from 07/22/18; call was dropped before chance to leave a message.

## 2018-08-08 ENCOUNTER — Encounter: Payer: Self-pay | Admitting: Dietician

## 2018-08-08 NOTE — Progress Notes (Signed)
Have attempted to reach patient to reschedule her missed appointment from 07/22/18, but have been unable to leave a message. Sent letter to referring provider.

## 2018-09-11 ENCOUNTER — Other Ambulatory Visit: Payer: Self-pay

## 2018-09-11 ENCOUNTER — Encounter: Payer: Self-pay | Admitting: Emergency Medicine

## 2018-09-11 ENCOUNTER — Emergency Department
Admission: EM | Admit: 2018-09-11 | Discharge: 2018-09-11 | Disposition: A | Discharge status: Home / Self Care | Payer: Medicare Other | Attending: Emergency Medicine | Admitting: Emergency Medicine

## 2018-09-11 DIAGNOSIS — I5032 Chronic diastolic (congestive) heart failure: Secondary | ICD-10-CM | POA: Insufficient documentation

## 2018-09-11 DIAGNOSIS — R739 Hyperglycemia, unspecified: Secondary | ICD-10-CM

## 2018-09-11 DIAGNOSIS — Z794 Long term (current) use of insulin: Secondary | ICD-10-CM | POA: Insufficient documentation

## 2018-09-11 DIAGNOSIS — Z7982 Long term (current) use of aspirin: Secondary | ICD-10-CM | POA: Insufficient documentation

## 2018-09-11 DIAGNOSIS — Z8541 Personal history of malignant neoplasm of cervix uteri: Secondary | ICD-10-CM | POA: Diagnosis not present

## 2018-09-11 DIAGNOSIS — I110 Hypertensive heart disease with heart failure: Secondary | ICD-10-CM | POA: Insufficient documentation

## 2018-09-11 DIAGNOSIS — Z79899 Other long term (current) drug therapy: Secondary | ICD-10-CM | POA: Diagnosis not present

## 2018-09-11 DIAGNOSIS — E1165 Type 2 diabetes mellitus with hyperglycemia: Secondary | ICD-10-CM | POA: Diagnosis not present

## 2018-09-11 DIAGNOSIS — R531 Weakness: Secondary | ICD-10-CM

## 2018-09-11 DIAGNOSIS — N3090 Cystitis, unspecified without hematuria: Secondary | ICD-10-CM | POA: Diagnosis not present

## 2018-09-11 DIAGNOSIS — E860 Dehydration: Secondary | ICD-10-CM

## 2018-09-11 LAB — COMPREHENSIVE METABOLIC PANEL
ALT: 22 U/L (ref 0–44)
AST: 23 U/L (ref 15–41)
Albumin: 3.5 g/dL (ref 3.5–5.0)
Alkaline Phosphatase: 91 U/L (ref 38–126)
Anion gap: 9 (ref 5–15)
BUN: 40 mg/dL — ABNORMAL HIGH (ref 8–23)
CO2: 24 mmol/L (ref 22–32)
CREATININE: 1.21 mg/dL — AB (ref 0.44–1.00)
Calcium: 9.1 mg/dL (ref 8.9–10.3)
Chloride: 98 mmol/L (ref 98–111)
GFR calc Af Amer: 53 mL/min — ABNORMAL LOW (ref >60)
GFR calc non Af Amer: 46 mL/min — ABNORMAL LOW (ref >60)
Glucose, Bld: 455 mg/dL — ABNORMAL HIGH (ref 70–99)
Potassium: 5 mmol/L (ref 3.5–5.1)
Sodium: 131 mmol/L — ABNORMAL LOW (ref 135–145)
Total Bilirubin: 1 mg/dL (ref 0.3–1.2)
Total Protein: 7.3 g/dL (ref 6.5–8.1)

## 2018-09-11 LAB — URINALYSIS, COMPLETE (UACMP) WITH MICROSCOPIC
Bilirubin Urine: NEGATIVE
Glucose, UA: >=500 mg/dL — AB
Ketones, ur: NEGATIVE mg/dL
Nitrite: POSITIVE — AB
Protein, ur: 30 mg/dL — AB
Specific Gravity, Urine: 1.014 (ref 1.005–1.030)
WBC, UA: >50 WBC/hpf — ABNORMAL HIGH (ref 0–5)
pH: 5 (ref 5.0–8.0)

## 2018-09-11 LAB — CBC
HCT: 47.3 % — ABNORMAL HIGH (ref 36.0–46.0)
Hemoglobin: 15.5 g/dL — ABNORMAL HIGH (ref 12.0–15.0)
MCH: 30 pg (ref 26.0–34.0)
MCHC: 32.8 g/dL (ref 30.0–36.0)
MCV: 91.5 fL (ref 80.0–100.0)
Platelets: 282 10*3/uL (ref 150–400)
RBC: 5.17 MIL/uL — ABNORMAL HIGH (ref 3.87–5.11)
RDW: 12.8 % (ref 11.5–15.5)
WBC: 9.1 10*3/uL (ref 4.0–10.5)
nRBC: 0 % (ref 0.0–0.2)

## 2018-09-11 LAB — GLUCOSE, CAPILLARY
Glucose-Capillary: 398 mg/dL — ABNORMAL HIGH (ref 70–99)
Glucose-Capillary: 390 mg/dL — ABNORMAL HIGH (ref 70–99)

## 2018-09-11 LAB — TROPONIN I: Troponin I: <0.03 ng/mL (ref <0.03)

## 2018-09-11 MED ORDER — INSULIN ASPART 100 UNIT/ML ~~LOC~~ SOLN
10.0000 [IU] | Freq: Once | SUBCUTANEOUS | Status: AC
  Administered 2018-09-11: 10 [IU] via INTRAVENOUS
  Filled 2018-09-11: qty 1

## 2018-09-11 MED ORDER — CEFDINIR 300 MG PO CAPS: 300.0000 mg | ORAL_CAPSULE | Freq: Two times a day (BID) | ORAL | 0 refills | Status: DC

## 2018-09-11 MED ORDER — ACETAMINOPHEN 500 MG PO TABS
1000.0000 mg | ORAL_TABLET | Freq: Once | ORAL | Status: AC
  Administered 2018-09-11: 1000 mg via ORAL
  Filled 2018-09-11: qty 2

## 2018-09-11 MED ORDER — SODIUM CHLORIDE 0.9 % IV BOLUS
1000.0000 mL | Freq: Once | INTRAVENOUS | Status: AC
  Administered 2018-09-11: 1000 mL via INTRAVENOUS

## 2018-09-11 MED ORDER — SODIUM CHLORIDE 0.9 % IV SOLN
1.0000 g | Freq: Once | INTRAVENOUS | Status: AC
  Administered 2018-09-11: 1 g via INTRAVENOUS
  Filled 2018-09-11: qty 10

## 2018-09-11 NOTE — Progress Notes (Signed)
Inpatient Diabetes Program Recommendations  AACE/ADA: New Consensus Statement on Inpatient Glycemic Control   Target Ranges:  Prepandial:   less than 140 mg/dL      Peak postprandial:   less than 180 mg/dL (1-2 hours)      Critically ill patients:  140 - 180 mg/dL   Results for MAMIE, HUNDERTMARK (MRN [id number]) as of 09/11/2018 10:31  Ref. Range 09/11/2018 06:28 09/11/2018 09:53  Glucose-Capillary Latest Ref Range: 70 - 99 mg/dL 398 (H) 390 (H)   Review of Glycemic Control  Diabetes history: DM2 Outpatient Diabetes medications: Humulin R U500 90 units TID, Metformin 1000 mg BID Current orders for Inpatient glycemic control: None; currently in Emergency Department  Inpatient Diabetes Program Recommendations:  Insulin - Basal: If admitted, recommend ordering Lantus 25 units BID (based on 123 kg x 0.4 units). Correction (SSI): If admitted, recommend ordering CBGs with Novolog 0-20 units AC&HS. HgbA1C: Noted A1C 13.9% on 06/04/18 in [facility]. If admitted please order a current A1C.  NOTE: In reviewing chart, noted patient has DM2 hx and uses Humulin R U500 (concentrated insulin) as an outpatient along with Metformin. Patient is followed by Dr. Gabriel Carina and was last seen on 08/19/2018. Per Care Everywhere, patient has poor control of DM as evidenced by A1C 12-14% over the past year. Per office note on 08/19/2018 by Dr. Gabriel Carina, "Diabetes is chronically uncontrolled due to non-compliance with her insulin. She aims to take her U500 insulin 90 units at 10 am, 4 pm, and 10 pm. She reports she may have only taken one dose daily for last few days. Has the best of intentions, but poor follow through. States she forgets.  Despite her efforts, she regularly forgets to take her insulin and/or scan the blood glucose."  Patient is currently in the Emergency Department with hyperglycemia. If patient is admitted, recommend ordering Lantus 25 units BID, CBGs with Novolog 0-20 units AC&HS, and an A1C.  Thanks, Barnie Alderman, RN, MSN, CDE Diabetes Coordinator Inpatient Diabetes Program [phone] (Team Pager from 8am to 5pm)

## 2018-09-11 NOTE — ED Notes (Signed)
Pt sitting on side of bed. No needs at this time.

## 2018-09-11 NOTE — ED Triage Notes (Signed)
Patient to ER from home via [facility] for c/o generalized weakness with chest pain and hyperglycemia since yesterday. Patient states "I just felt like I couldn't do anything.". Denies any known fevers or chills. Denies any increased shortness of breath outside of normal for body habitus. CBG per EMS was over 400. 12 lead for EMS was WNL. Patient also has small wound to abdomen (approx dime size), which she states she has been scratching.

## 2018-09-11 NOTE — ED Notes (Signed)
Says she feels better and wants to go home.  Sitting in chair in nad.

## 2018-09-11 NOTE — ED Notes (Signed)
Report given to Valerie, RN.

## 2018-09-11 NOTE — ED Provider Notes (Signed)
[facility] Emergency Department Provider Note  ____________________________________________  Time seen: Approximately 11:24 AM  I have reviewed the triage vital signs and the nursing notes.   HISTORY  Chief Complaint Weakness    HPI Judith Shaw is a 68 y.o. female with a history of CHF, diabetes, hypertension who comes to the ED complaining of blurry vision and anterior chest soreness.  This is been going on for the last 2 days, gradual onset, associated with her glucometer at home being out of batteries and nonfunctioning and being unable to check her blood sugar.  She has continued taking her insulin which is 100 units of regular insulin 3 times a day.  She is not currently on Lantus.  She has a follow-up with her endocrinologist in 1 week.  Denies exertional symptoms, shortness of breath, fevers chills or cough.      Past Medical History:  Diagnosis Date  . Cancer ([place])    pt states hx of uterine cancer and had a complete hyst  . CHF (congestive heart failure) ([place])   . Depression   . Diabetes mellitus without complication ([place])   . Hyperlipidemia   . Hypertension      Patient Active Problem List   Diagnosis Date Noted  . Tinnitus, bilateral 04/05/2017  . Concussion syndrome 04/03/2017  . Concussion without loss of consciousness 01/24/2017  . Difficulty walking 01/24/2017  . Headache disorder 01/24/2017  . Numbness and tingling 01/24/2017  . Postural urinary incontinence 01/24/2017  . Sepsis ([place]) 09/21/2016  . UTI (urinary tract infection) 09/21/2016  . HTN (hypertension) 09/21/2016  . Diabetes ([place]) 09/21/2016  . HLD (hyperlipidemia) 09/21/2016  . Depression 09/21/2016  . Health care maintenance 10/05/2015  . Recurrent major depressive disorder, in full remission ([place]) 06/16/2014  . DM (diabetes mellitus) type II controlled, neurological manifestation ([place]) 06/12/2014  . Obesity, Class III, BMI 40-49.9 (morbid obesity) ([place]) 06/12/2014   . Chronic diastolic CHF (congestive heart failure) ([place]) 03/10/2014  . H/O diastolic dysfunction 82/50/5397  . Sleep apnea 03/10/2014  . SOB (shortness of breath) 03/10/2014     Past Surgical History:  Procedure Laterality Date  . ABDOMINAL HYSTERECTOMY    . CHOLECYSTECTOMY       Prior to Admission medications   Medication Sig Start Date End Date Taking? Authorizing Provider  aspirin EC 81 MG tablet Take 81 mg by mouth daily.    [provider]  carvedilol (COREG) 3.125 MG tablet Take 3.125 mg by mouth 2 (two) times daily with a meal.    [provider]  cefdinir (OMNICEF) 300 MG capsule Take 1 capsule (300 mg total) by mouth 2 (two) times daily. 09/11/18   Carrie Mew, MD  clotrimazole-betamethasone (LOTRISONE) cream Apply 1 application topically 2 (two) times daily.    [provider]  Continuous Blood Gluc Receiver (FREESTYLE LIBRE READER) DEVI Use 1 each as directed. Use to check sugars daily. FREE STYLE LIBRE READER E11.65 10/18/16   [provider]  Continuous Blood Gluc Sensor (FREESTYLE LIBRE 14 DAY SENSOR) MISC U 1 KIT Q 14 DAYS 06/26/18   [provider]  DULoxetine (CYMBALTA) 30 MG capsule Take 30 mg by mouth daily.    [provider]  furosemide (LASIX) 20 MG tablet Take 20 mg every other day in the morning. 11/29/16   [provider]  insulin regular human CONCENTRATED (HUMULIN R) 500 UNIT/ML kwikpen Inject 90 Units into the skin 3 (three) times daily. 06/12/18 06/12/19  [provider]  metFORMIN (GLUCOPHAGE-XR) 500 MG 24 hr tablet Take 1,000 mg by mouth 2 (two) times daily.    [provider]  mirabegron ER (MYRBETRIQ) 25 MG TB24 tablet Take 1 tablet (25 mg total) by mouth daily. 05/22/18   Zara Council A, PA-C  potassium chloride (K-DUR,KLOR-CON) 10 MEQ tablet Take 10 mEq by mouth daily.    [provider]  ramipril (ALTACE) 5 MG capsule Take 5 mg by mouth daily.    [provider]  simvastatin (ZOCOR) 40 MG tablet Take 40 mg by mouth every evening.    [provider]     Allergies Codeine   Family History  Problem Relation Age of Onset  . Hypertension Mother   . Heart disease Father   . Prostate cancer Brother   . Diabetes Maternal Aunt   . Kidney cancer Neg Hx   . Bladder Cancer Neg Hx     Social History Social History   Tobacco Use  . Smoking status: Never Smoker  . Smokeless tobacco: Never Used  Substance Use Topics  . Alcohol use: No  . Drug use: No    Review of Systems  Constitutional:   No fever or chills.  ENT:   No sore throat. No rhinorrhea. Cardiovascular:   No chest pain or syncope. Respiratory:   No dyspnea or cough. Gastrointestinal:   Negative for abdominal pain, vomiting and diarrhea.  Musculoskeletal:   Positive left chest pain, tender to the touch per patient. All other systems reviewed and are negative except as documented above in ROS and HPI.  ____________________________________________   PHYSICAL EXAM:  VITAL SIGNS: ED Triage Vitals  Enc Vitals Group     BP 09/11/18 0619 (!) 158/73     Pulse Rate 09/11/18 0619 63     Resp 09/11/18 0619 (!) 24     Temp 09/11/18 0619 98.4 F (36.9 C)     Temp Source 09/11/18 0619 Oral     SpO2 09/11/18 0619 99 %     Weight 09/11/18 0620 272 lb (123.4 kg)     Height 09/11/18 0620 5' 5" (1.651 m)     Head Circumference --      Peak Flow --      Pain Score 09/11/18 0619 6     Pain Loc --      Pain Edu? --      Excl. in [place]? --     Vital signs reviewed, nursing assessments reviewed.   Constitutional:   Alert and oriented. Non-toxic appearance. Eyes:   Conjunctivae are normal. EOMI. PERRL. ENT      Head:   Normocephalic and atraumatic.      Nose:   No congestion/rhinnorhea.       Mouth/Throat:   Dry mucous membranes, no pharyngeal erythema. No peritonsillar mass.       Neck:   No meningismus. Full ROM. Hematological/Lymphatic/Immunilogical:   No  cervical lymphadenopathy. Cardiovascular:   RRR. Symmetric bilateral radial and DP pulses.  No murmurs. Cap refill less than 2 seconds. Respiratory:   Normal respiratory effort without tachypnea/retractions. Breath sounds are clear and equal bilaterally. No wheezes/rales/rhonchi. Gastrointestinal:   Soft and nontender. Non distended. There is no CVA tenderness.  No rebound, rigidity, or guarding.  Musculoskeletal:   Normal range of motion in all extremities. No joint effusions.  No lower extremity tenderness.  No edema.  Left upper anterior chest pain reproducible with palpation. Neurologic:   Normal speech and language.  Motor grossly intact. No acute  focal neurologic deficits are appreciated.  Skin:    Skin is warm, dry and intact. No rash noted.  No petechiae, purpura, or bullae.  ____________________________________________    LABS (pertinent positives/negatives) (all labs ordered are listed, but only abnormal results are displayed) Labs Reviewed  CBC - Abnormal; Notable for the following components:      Result Value   RBC 5.17 (*)    Hemoglobin 15.5 (*)    HCT 47.3 (*)    All other components within normal limits  URINALYSIS, COMPLETE (UACMP) WITH MICROSCOPIC - Abnormal; Notable for the following components:   Color, Urine YELLOW (*)    APPearance CLOUDY (*)    Glucose, UA >=500 (*)    Hgb urine dipstick MODERATE (*)    Protein, ur 30 (*)    Nitrite POSITIVE (*)    Leukocytes, UA LARGE (*)    WBC, UA >50 (*)    Bacteria, UA MANY (*)    All other components within normal limits  COMPREHENSIVE METABOLIC PANEL - Abnormal; Notable for the following components:   Sodium 131 (*)    Glucose, Bld 455 (*)    BUN 40 (*)    Creatinine, Ser 1.21 (*)    GFR calc non Af Amer 46 (*)    GFR calc Af Amer 53 (*)    All other components within normal limits  GLUCOSE, CAPILLARY - Abnormal; Notable for the following components:   Glucose-Capillary 398 (*)    All other components within  normal limits  GLUCOSE, CAPILLARY - Abnormal; Notable for the following components:   Glucose-Capillary 390 (*)    All other components within normal limits  URINE CULTURE  TROPONIN I  CBG MONITORING, ED   ____________________________________________   EKG    ____________________________________________    RADIOLOGY  No results found.  ____________________________________________   PROCEDURES Procedures  ____________________________________________  DIFFERENTIAL DIAGNOSIS   UTI, pneumonia, metabolic derangement, dehydration  CLINICAL IMPRESSION / ASSESSMENT AND PLAN / ED COURSE  Pertinent labs & imaging results that were available during my care of the patient were reviewed by me and considered in my medical decision making (see chart for details).    Patient presents with hyperglycemia, blood sugar of 450 on labs.  No other focal symptoms.  Chest pain is clearly musculoskeletal.  Blurry vision is not indicative of underlying neurologic disease and I think related to the hyperglycemia.Considering the patient's symptoms, medical history, and physical examination today, I have low suspicion for ischemic stroke, intracranial hemorrhage, meningitis, encephalitis, carotid or vertebral dissection, venous sinus thrombosis, MS, intracranial hypertension, glaucoma, CRAO, CRVO, or temporal arteritis.   After 10 units of IV regular insulin and 1 L of saline, patient is feeling much better.  She is sitting upright in a chair and eating.  Blood sugar only improved to 390, but she wants to go home.  She will take her usual insulin dose at home when she gets there.  She is stable at this time.  It appears that her blood sugar control has been destabilized by cystitis.  She was given IV ceftriaxone and will be continued on Omnicef at home.  Recommend she try to follow-up with her endocrinologist sooner but if not primary care and keep the endocrinology appointment which is in 1 week per the  patient.  Doubt pyelonephritis or obstructing stone.  Return precautions discussed      ____________________________________________   FINAL CLINICAL IMPRESSION(S) / ED DIAGNOSES    Final diagnoses:  Generalized weakness  Dehydration  Hyperglycemia  Cystitis     ED Discharge Orders         Ordered    cefdinir (OMNICEF) 300 MG capsule  2 times daily     09/11/18 1123          Portions of this note were generated with dragon dictation software. Dictation errors may occur despite best attempts at proofreading.   Carrie Mew, MD 09/11/18 [phone]

## 2018-09-13 LAB — URINE CULTURE

## 2019-03-16 ENCOUNTER — Emergency Department
Admission: EM | Admit: 2019-03-16 | Discharge: 2019-03-16 | Disposition: A | Discharge status: Home / Self Care | Payer: Medicare Other | Attending: Emergency Medicine | Admitting: Emergency Medicine

## 2019-03-16 ENCOUNTER — Other Ambulatory Visit: Payer: Self-pay

## 2019-03-16 ENCOUNTER — Emergency Department: Payer: Medicare Other

## 2019-03-16 DIAGNOSIS — R0789 Other chest pain: Secondary | ICD-10-CM | POA: Diagnosis not present

## 2019-03-16 DIAGNOSIS — E119 Type 2 diabetes mellitus without complications: Secondary | ICD-10-CM | POA: Insufficient documentation

## 2019-03-16 DIAGNOSIS — E860 Dehydration: Secondary | ICD-10-CM | POA: Diagnosis not present

## 2019-03-16 DIAGNOSIS — Z20828 Contact with and (suspected) exposure to other viral communicable diseases: Secondary | ICD-10-CM | POA: Diagnosis not present

## 2019-03-16 DIAGNOSIS — R413 Other amnesia: Secondary | ICD-10-CM | POA: Insufficient documentation

## 2019-03-16 DIAGNOSIS — Z794 Long term (current) use of insulin: Secondary | ICD-10-CM | POA: Insufficient documentation

## 2019-03-16 DIAGNOSIS — I5032 Chronic diastolic (congestive) heart failure: Secondary | ICD-10-CM | POA: Insufficient documentation

## 2019-03-16 DIAGNOSIS — I110 Hypertensive heart disease with heart failure: Secondary | ICD-10-CM | POA: Insufficient documentation

## 2019-03-16 DIAGNOSIS — R410 Disorientation, unspecified: Secondary | ICD-10-CM | POA: Diagnosis present

## 2019-03-16 DIAGNOSIS — R0602 Shortness of breath: Secondary | ICD-10-CM | POA: Insufficient documentation

## 2019-03-16 DIAGNOSIS — N390 Urinary tract infection, site not specified: Secondary | ICD-10-CM | POA: Insufficient documentation

## 2019-03-16 DIAGNOSIS — Z8542 Personal history of malignant neoplasm of other parts of uterus: Secondary | ICD-10-CM | POA: Diagnosis not present

## 2019-03-16 LAB — BASIC METABOLIC PANEL
Anion gap: 11 (ref 5–15)
BUN: 33 mg/dL — ABNORMAL HIGH (ref 8–23)
CO2: 21 mmol/L — ABNORMAL LOW (ref 22–32)
Calcium: 9.3 mg/dL (ref 8.9–10.3)
Chloride: 100 mmol/L (ref 98–111)
Creatinine, Ser: 1.26 mg/dL — ABNORMAL HIGH (ref 0.44–1.00)
GFR calc Af Amer: 51 mL/min — ABNORMAL LOW (ref >60)
GFR calc non Af Amer: 44 mL/min — ABNORMAL LOW (ref >60)
Glucose, Bld: 304 mg/dL — ABNORMAL HIGH (ref 70–99)
Potassium: 4.1 mmol/L (ref 3.5–5.1)
Sodium: 132 mmol/L — ABNORMAL LOW (ref 135–145)

## 2019-03-16 LAB — CBC
HCT: 44.9 % (ref 36.0–46.0)
Hemoglobin: 15.5 g/dL — ABNORMAL HIGH (ref 12.0–15.0)
MCH: 29.8 pg (ref 26.0–34.0)
MCHC: 34.5 g/dL (ref 30.0–36.0)
MCV: 86.3 fL (ref 80.0–100.0)
Platelets: 260 10*3/uL (ref 150–400)
RBC: 5.2 MIL/uL — ABNORMAL HIGH (ref 3.87–5.11)
RDW: 12.6 % (ref 11.5–15.5)
WBC: 9.4 10*3/uL (ref 4.0–10.5)
nRBC: 0 % (ref 0.0–0.2)

## 2019-03-16 LAB — URINALYSIS, COMPLETE (UACMP) WITH MICROSCOPIC
Bilirubin Urine: NEGATIVE
Glucose, UA: >=500 mg/dL — AB
Ketones, ur: NEGATIVE mg/dL
Nitrite: NEGATIVE
Protein, ur: 30 mg/dL — AB
Specific Gravity, Urine: 1.014 (ref 1.005–1.030)
pH: 5 (ref 5.0–8.0)

## 2019-03-16 LAB — TROPONIN I (HIGH SENSITIVITY): Troponin I (High Sensitivity): 7 ng/L (ref <18)

## 2019-03-16 IMAGING — CR CHEST - 2 VIEW
2 series · 2 of 2 positions shown · non-contrast
Comparison: Chest x-rays and CT chest [DATE] and earlier.

CLINICAL DATA: Two week history of progressive chest tightness and
several week history of progressively worsening generalized
weakness. Current history of chronic diastolic CHF. Personal history
of uterine cancer.

EXAM:
CHEST - 2 VIEW

[chest pa]
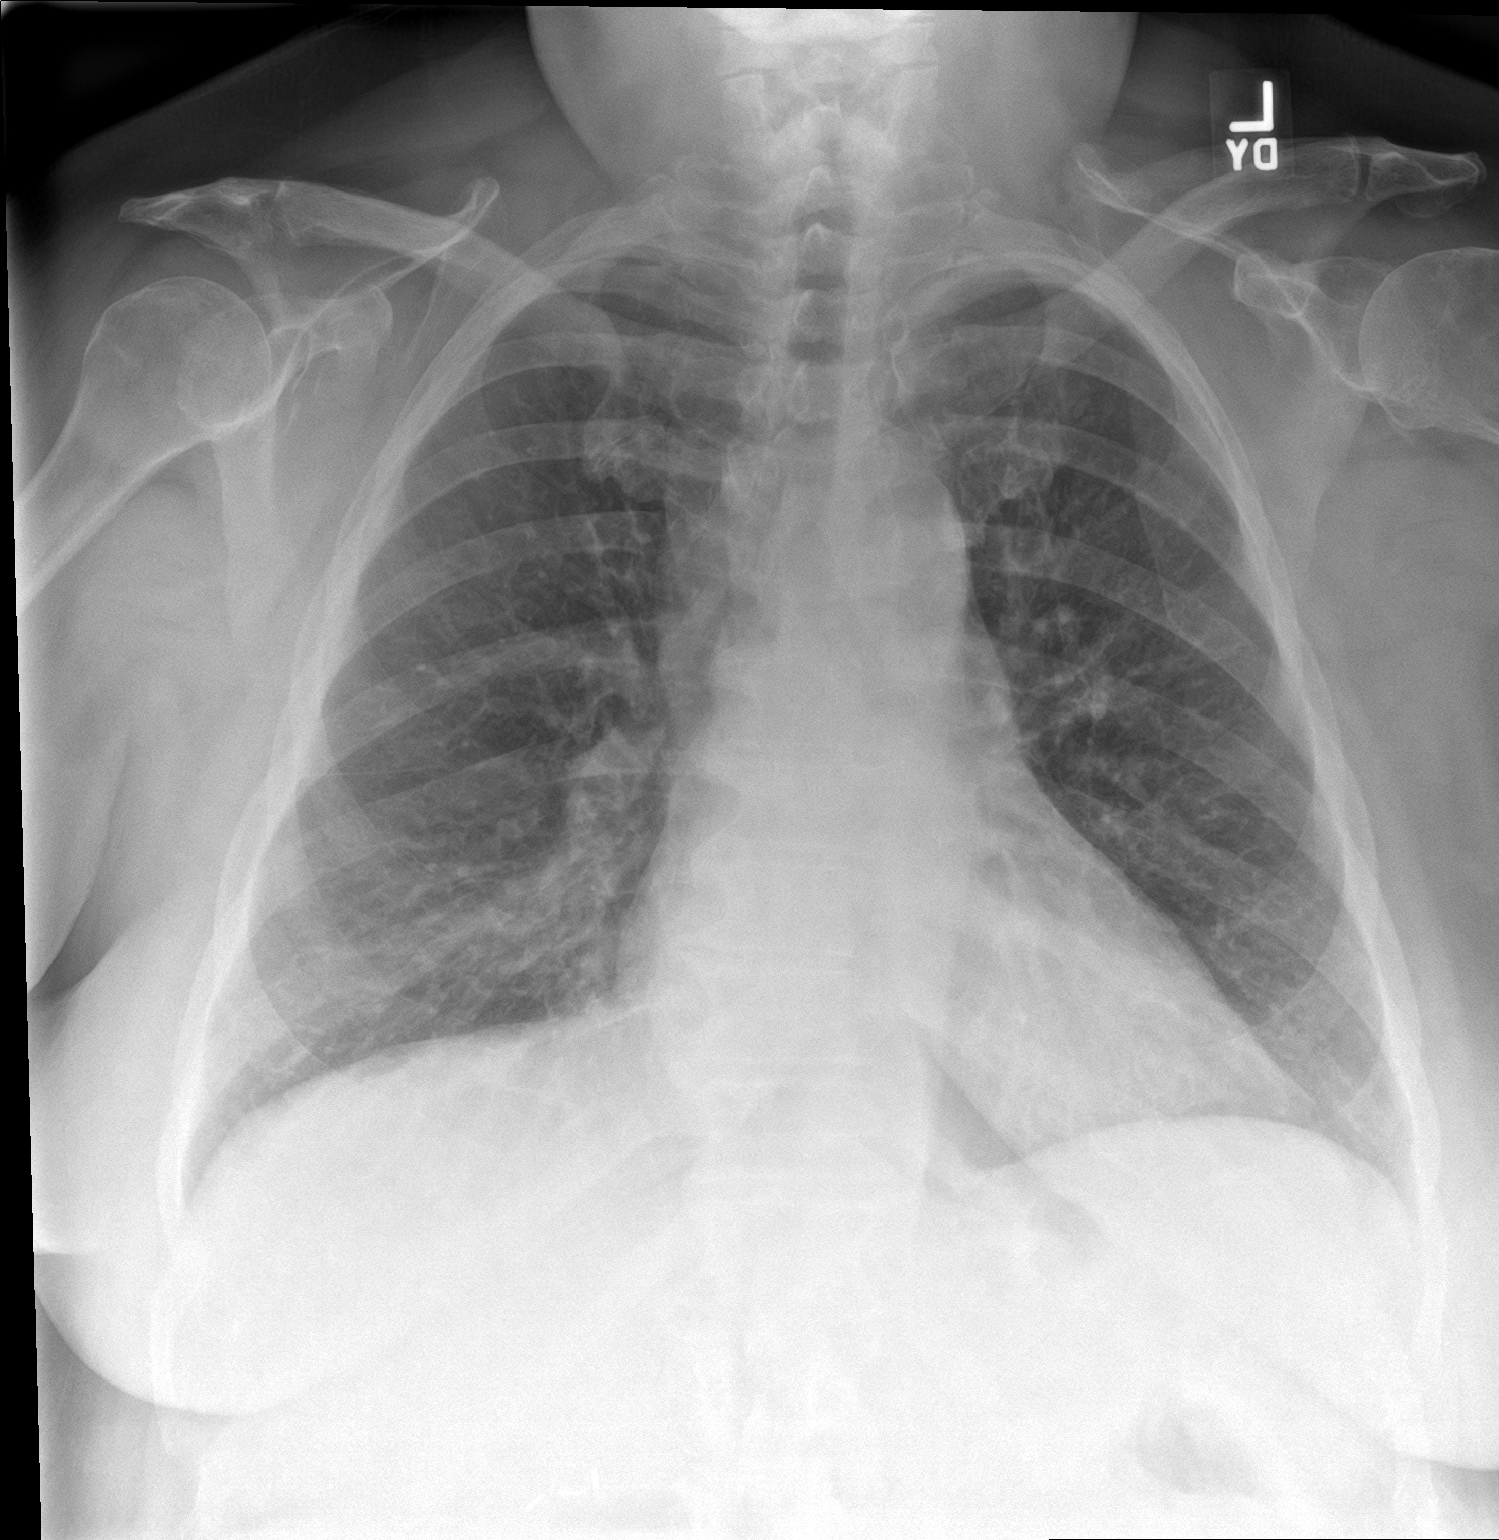

[chest lat]
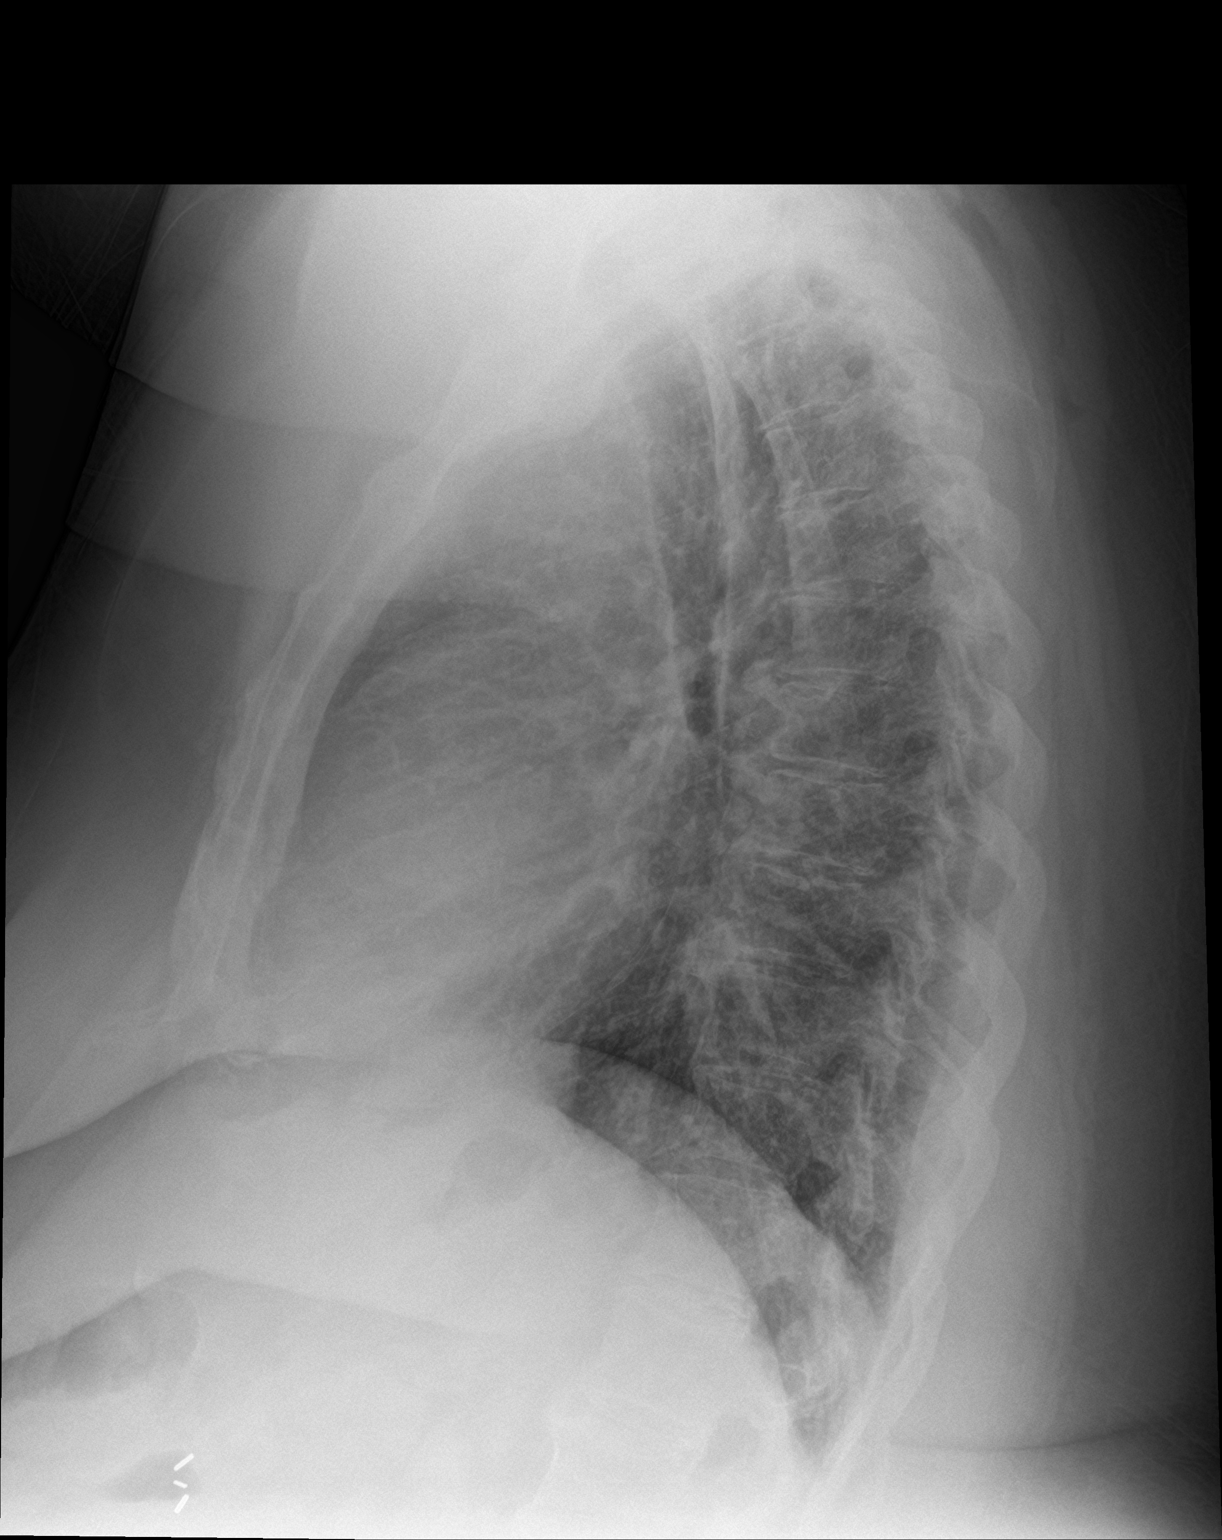

[2 of 2 positions shown; findings below may reference images not displayed]

FINDINGS: Cardiac silhouette upper normal in size, unchanged. Thoracic aorta
mildly atherosclerotic, unchanged. Hilar and mediastinal contours
otherwise unremarkable. Minimal mild pulmonary venous hypertension
without overt edema. Lungs clear. No pleural effusions. Degenerative
changes involving the thoracic and upper lumbar spine.
IMPRESSION: No acute cardiopulmonary disease.

## 2019-03-16 IMAGING — CT CT HEAD WITHOUT CONTRAST
3 series · 15 of 47 positions shown, 18 images · non-contrast
Comparison: [DATE].

CLINICAL DATA: Increased weakness over the past several weeks.

EXAM:
CT HEAD WITHOUT CONTRAST
TECHNIQUE: Contiguous axial images were obtained from the base of the skull
through the vertex without intravenous contrast.

[Series 2: head wo · axial · 0.40mm/px · z∈[-143,-18]mm · 9 of 30 slices shown, 12 images]
[im 3/30  brain]
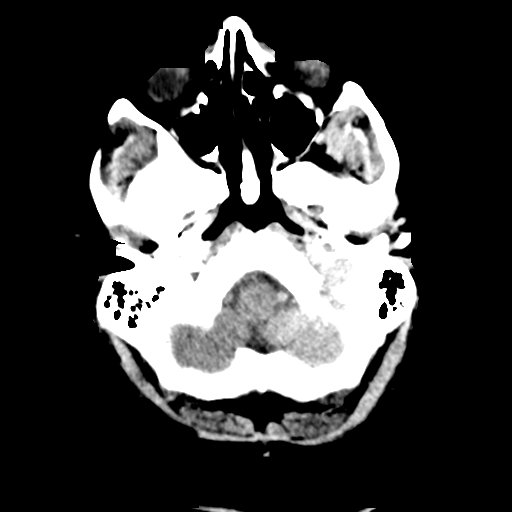
[im 3/30  bone]
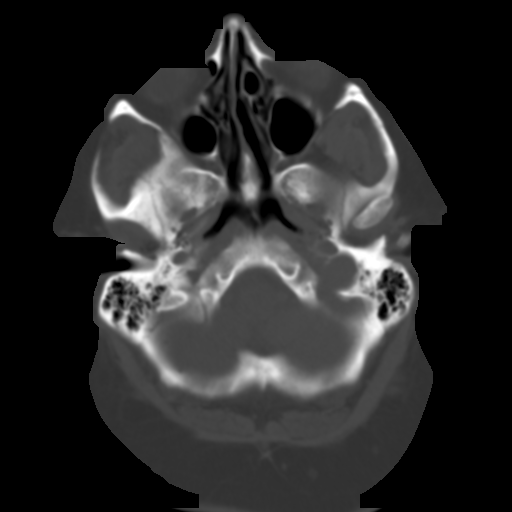
[im 6/30  brain]
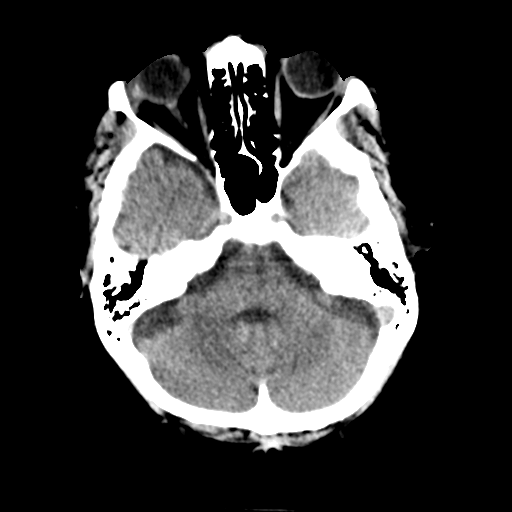
[im 9/30  brain]
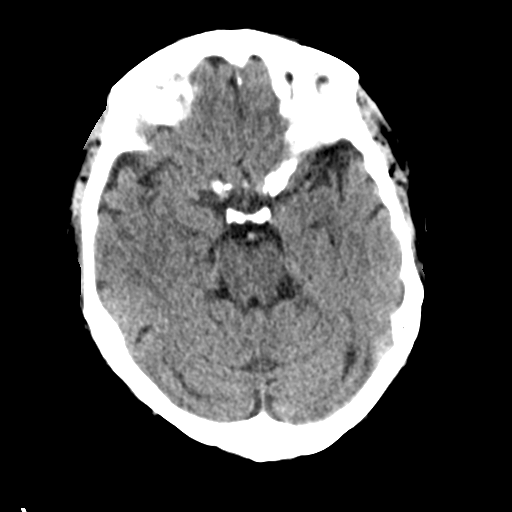
[im 12/30  brain]
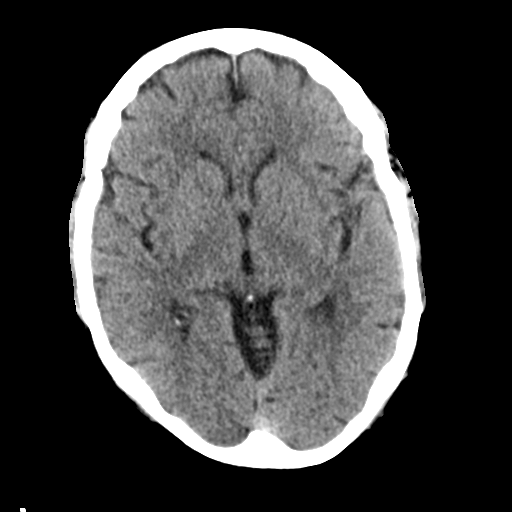
[im 16/30  brain]
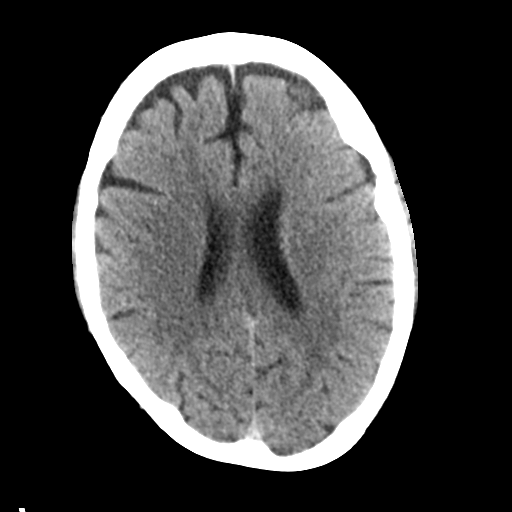
[im 16/30  bone]
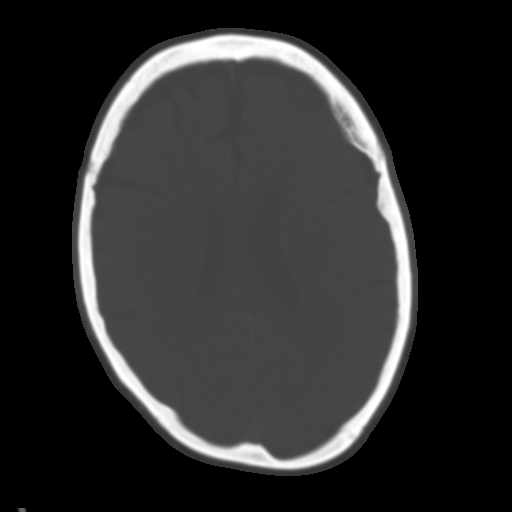
[im 19/30  brain]
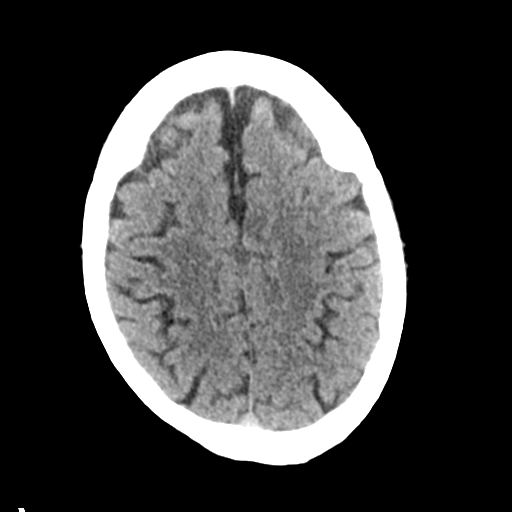
[im 22/30  brain]
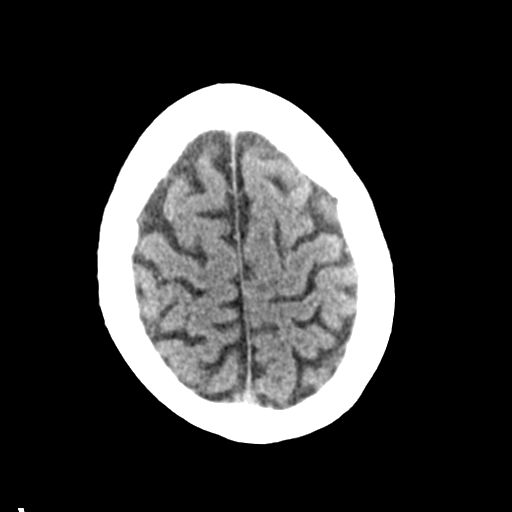
[im 25/30  brain]
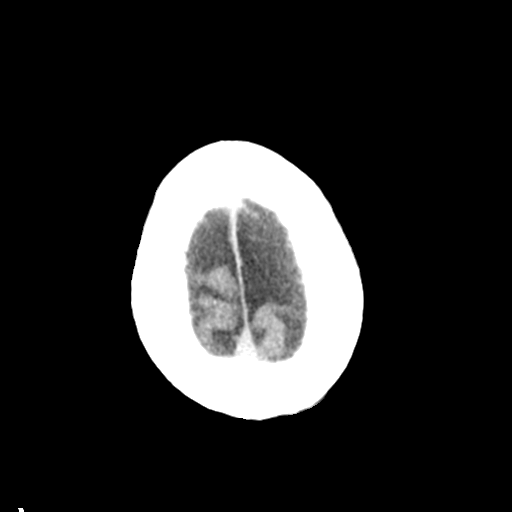
[im 28/30  brain]
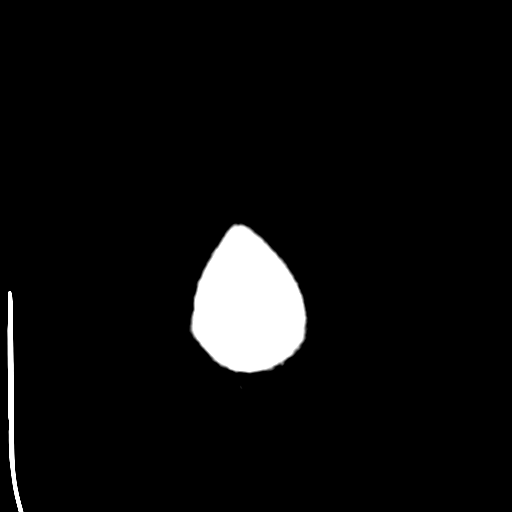
[im 28/30  bone]
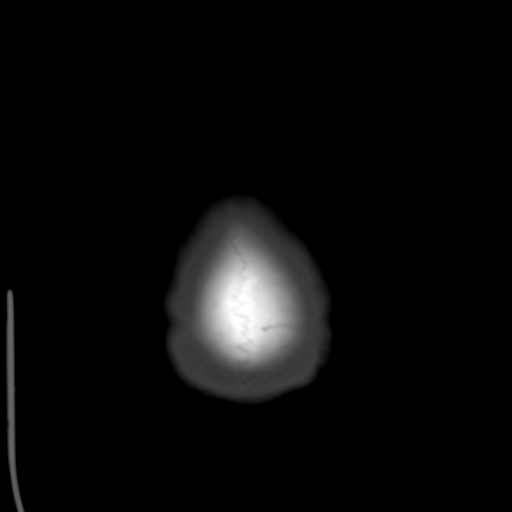

[Series 4: coronal soft tissue · coronal · 0.29mm/px · 3 of 63 slices shown]
[im 21/63  brain]
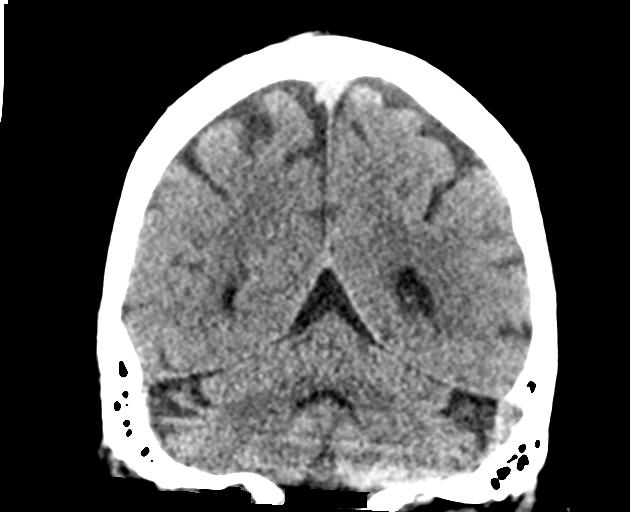
[im 28/63  brain]
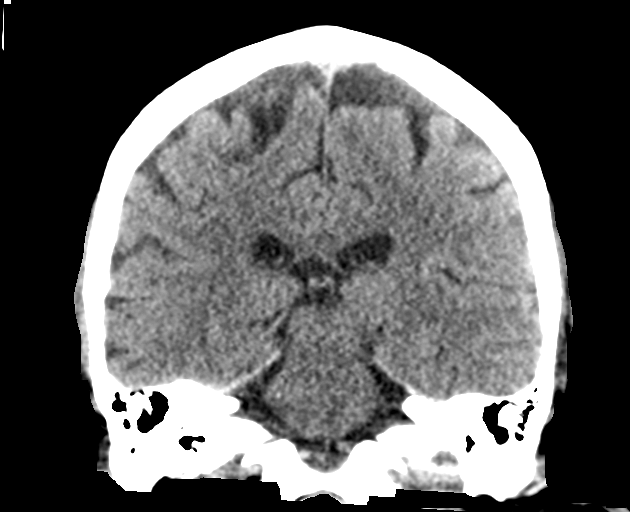
[im 35/63  brain]
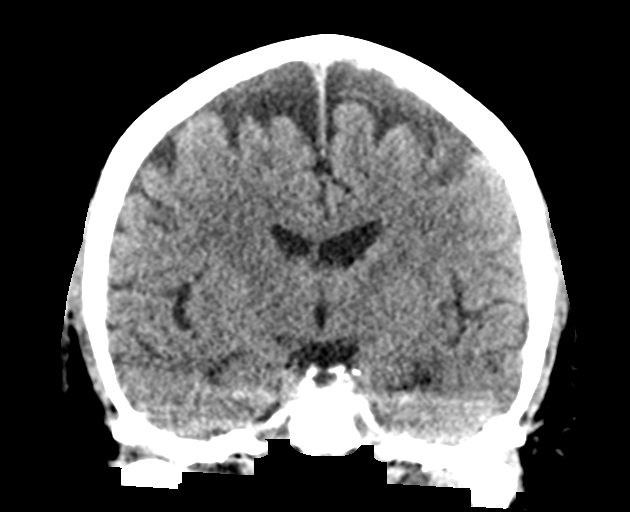

[Series 5: sagittal soft tissue · sagittal · 0.29mm/px · 3 of 52 slices shown]
[im 18/52  brain]
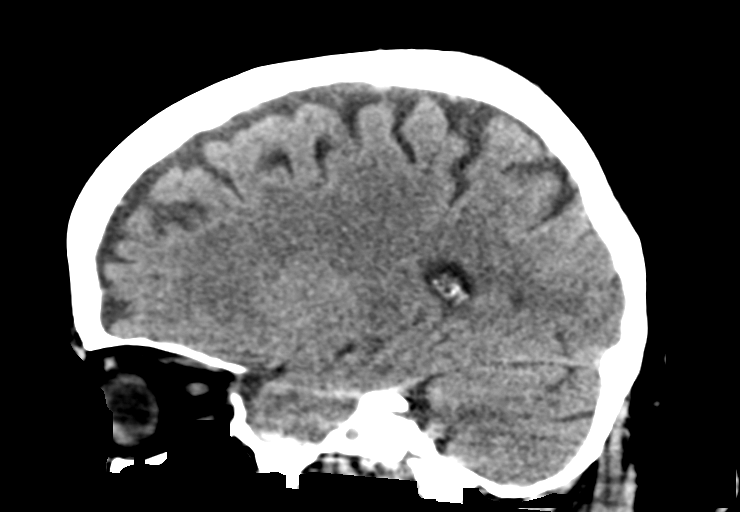
[im 26/52  brain]
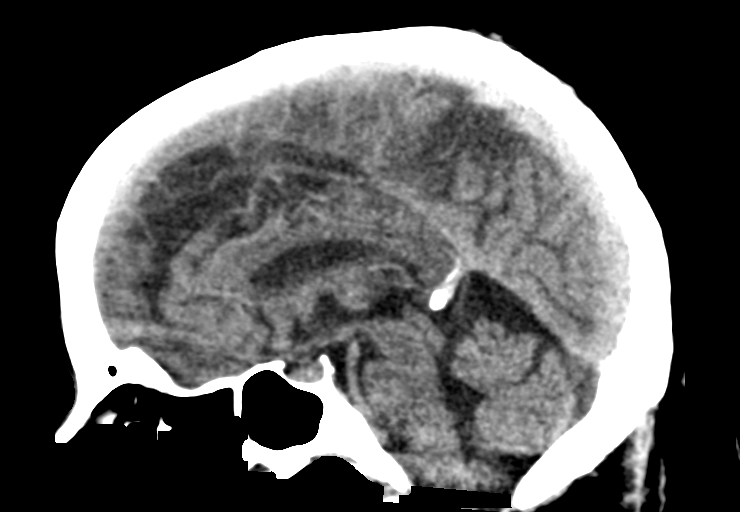
[im 35/52  brain]
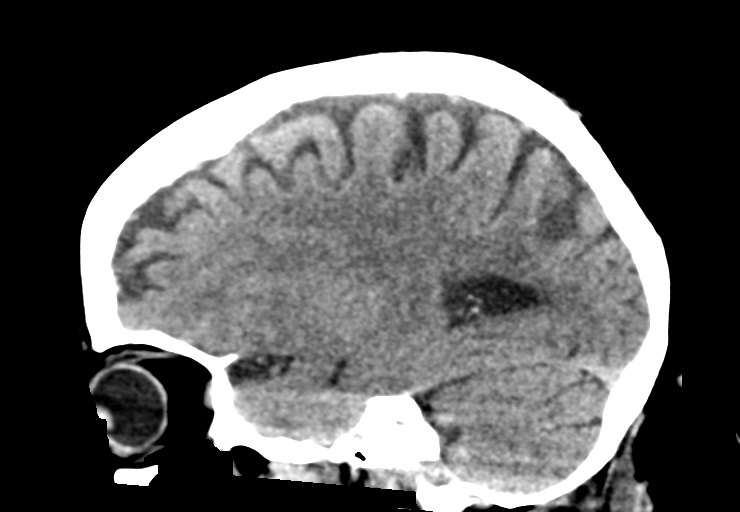

[15 of 47 positions shown; findings below may reference images not displayed]

FINDINGS: Brain: Stable mildly enlarged ventricles and cortical sulci. Stable
minimal patchy white matter low density in both cerebral
hemispheres. No intracranial hemorrhage, mass lesion or CT evidence
of acute infarction.

Vascular: No hyperdense vessel or unexpected calcification.

Skull: Normal. Negative for fracture or focal lesion.

Sinuses/Orbits: Unremarkable.

Other: None.
IMPRESSION: No acute abnormality. Stable mild diffuse cerebral and cerebellar
atrophy and minimal chronic small vessel white matter ischemic
changes in both cerebral hemispheres.

## 2019-03-16 MED ORDER — SODIUM CHLORIDE 0.9% FLUSH: 3.0000 mL | Freq: Once | INTRAVENOUS | Status: DC

## 2019-03-16 MED ORDER — CEPHALEXIN 500 MG PO CAPS: 500.0000 mg | ORAL_CAPSULE | Freq: Two times a day (BID) | ORAL | 0 refills | Status: DC

## 2019-03-16 MED ORDER — SODIUM CHLORIDE 0.9 % IV BOLUS
1000.0000 mL | Freq: Once | INTRAVENOUS | Status: AC
  Administered 2019-03-16: 1000 mL via INTRAVENOUS

## 2019-03-16 NOTE — Discharge Instructions (Addendum)
Please make sure you are drinking plenty of fluids.  Please follow-up with your regular doctor this coming week.  Please take the antibiotic Keflex 1 pill 2 times a day.  Please follow-up with your regular doctor in the next week.  Return here if you are sicker at all.  That includes fever nausea vomiting or just feeling ill.  Be sure you wear your CPAP at night.  Having such a low oxygen saturation can easily interfere with your thinking.  And that might account for a lot of your fuzzy feeling that you are having.  If it is not working properly you can get your doctor to set up another sleep study so they can adjust the settings on the CPAP.

## 2019-03-16 NOTE — ED Notes (Signed)
[facility]anner not working at this time, pt voices understanding of d/c paperwork

## 2019-03-16 NOTE — ED Provider Notes (Addendum)
[facility] Emergency Department Provider Note  ____________________________________________  Time seen: Approximately 12:55 PM  I have reviewed the triage vital signs and the nursing notes.   HISTORY  Chief Complaint Confusion   HPI Judith Shaw is a 68 y.o. female with a history of uterine cancer, CHF, diabetes, hypertension who  complains of confusion for the past 2 weeks.  She states that she is having memory issues, getting lost while trying to drive to her daughter's house even though she knows the route well, feeling like her concentration is poor.  She states that family members have noticed as well and told her that she is having memory problems.  Denies any trauma or acute changes, no sudden severe headaches.  She just feels a "buzzing" in her bilateral frontal head.  No vision changes paresthesias or weakness.  Ambulates without any assistive device.  No falls.  She also mentions that she is having some chest tightness and shortness of breath which are chronic issues for her.  Worse with walking, better with rest, she attributes it to her obesity and states that it has been going on for many years and unchanged, contrary to the triage note.     Past Medical History:  Diagnosis Date  . Cancer ([place])    pt states hx of uterine cancer and had a complete hyst  . CHF (congestive heart failure) ([place])   . Depression   . Diabetes mellitus without complication ([place])   . Hyperlipidemia   . Hypertension      Patient Active Problem List   Diagnosis Date Noted  . Benign essential HTN 07/19/2018  . Fatty liver 05/15/2018  . Hx of adenomatous colonic polyps 05/15/2018  . Type 2 diabetes mellitus with diabetic nephropathy, with long-term current use of insulin ([place]) 05/01/2018  . Tinnitus, bilateral 04/05/2017  . Concussion syndrome 04/03/2017  . Concussion without loss of consciousness 01/24/2017  . Difficulty walking 01/24/2017  . Headache disorder  01/24/2017  . Numbness and tingling 01/24/2017  . Postural urinary incontinence 01/24/2017  . Sepsis ([place]) 09/21/2016  . UTI (urinary tract infection) 09/21/2016  . HTN (hypertension) 09/21/2016  . Diabetes ([place]) 09/21/2016  . Depression 09/21/2016  . Health care maintenance 10/05/2015  . Recurrent major depressive disorder, in full remission ([place]) 06/16/2014  . DM (diabetes mellitus) type II controlled, neurological manifestation ([place]) 06/12/2014  . Morbid obesity ([place]) 06/12/2014  . Hyperlipidemia 03/10/2014  . Chronic diastolic CHF (congestive heart failure) ([place]) 03/10/2014  . H/O diastolic dysfunction 41/96/2229  . Sleep apnea 03/10/2014  . SOB (shortness of breath) 03/10/2014     Past Surgical History:  Procedure Laterality Date  . ABDOMINAL HYSTERECTOMY    . CHOLECYSTECTOMY       Prior to Admission medications   Medication Sig Start Date End Date Taking? Authorizing Provider  aspirin EC 81 MG tablet Take 81 mg by mouth daily.    [provider]  carvedilol (COREG) 3.125 MG tablet Take 3.125 mg by mouth 2 (two) times daily with a meal.    [provider]  cefdinir (OMNICEF) 300 MG capsule Take 1 capsule (300 mg total) by mouth 2 (two) times daily. Patient not taking: Reported on 03/16/2019 09/11/18   Carrie Mew, MD  clotrimazole-betamethasone (LOTRISONE) cream Apply 1 application topically 2 (two) times daily.    [provider]  DULoxetine (CYMBALTA) 30 MG capsule Take 30 mg by mouth daily.    [provider]  furosemide (LASIX) 20 MG tablet  Take 20 mg every other day in the morning. 11/29/16   [provider]  insulin regular human CONCENTRATED (HUMULIN R) 500 UNIT/ML kwikpen Inject 90 Units into the skin 3 (three) times daily. 06/12/18 06/12/19  [provider]  metFORMIN (GLUCOPHAGE-XR) 500 MG 24 hr tablet Take 1,000 mg by mouth 2 (two) times daily.    [provider]  mirabegron ER (MYRBETRIQ) 25 MG  TB24 tablet Take 1 tablet (25 mg total) by mouth daily. 05/22/18   Zara Council A, PA-C  potassium chloride (K-DUR,KLOR-CON) 10 MEQ tablet Take 10 mEq by mouth daily.    [provider]  ramipril (ALTACE) 5 MG capsule Take 5 mg by mouth daily.    [provider]  simvastatin (ZOCOR) 40 MG tablet Take 40 mg by mouth every evening.    [provider]     Allergies Codeine   Family History  Problem Relation Age of Onset  . Hypertension Mother   . Heart disease Father   . Prostate cancer Brother   . Diabetes Maternal Aunt   . Kidney cancer Neg Hx   . Bladder Cancer Neg Hx     Social History Social History   Tobacco Use  . Smoking status: Never Smoker  . Smokeless tobacco: Never Used  Substance Use Topics  . Alcohol use: No  . Drug use: No    Review of Systems  Constitutional:   No fever or chills.  ENT:   No sore throat. No rhinorrhea. Cardiovascular:   Positive chronic chest pain syncope. Respiratory:   Positive chronic shortness of breath without cough. Gastrointestinal:   Negative for abdominal pain, vomiting and diarrhea.  Musculoskeletal:   Negative for focal pain or swelling All other systems reviewed and are negative except as documented above in ROS and HPI.  ____________________________________________   PHYSICAL EXAM:  VITAL SIGNS: ED Triage Vitals  Enc Vitals Group     BP 03/16/19 1143 (!) 71/40     Pulse Rate 03/16/19 1143 73     Resp 03/16/19 1143 16     Temp 03/16/19 1143 97.8 F (36.6 C)     Temp Source 03/16/19 1143 Oral     SpO2 03/16/19 1143 97 %     Weight 03/16/19 1144 250 lb (113.4 kg)     Height 03/16/19 1144 5\' 4"  (1.626 m)     Head Circumference --      Peak Flow --      Pain Score 03/16/19 1144 5     Pain Loc --      Pain Edu? --      Excl. in [place]? --     Vital signs reviewed, nursing assessments reviewed.   Constitutional:   Alert and oriented. Non-toxic appearance. Eyes:   Conjunctivae are  normal. EOMI. PERRL. ENT      Head:   Normocephalic and atraumatic.      Nose:   No congestion/rhinnorhea.       Mouth/Throat:   MMM, no pharyngeal erythema. No peritonsillar mass.       Neck:   No meningismus. Full ROM. Hematological/Lymphatic/Immunilogical:   No cervical lymphadenopathy. Cardiovascular:   RRR. Symmetric bilateral radial and DP pulses.  No murmurs. Cap refill less than 2 seconds. Respiratory:   Normal respiratory effort without tachypnea/retractions. Breath sounds are clear and equal bilaterally. No wheezes/rales/rhonchi. Gastrointestinal:   Soft and nontender. Non distended. There is no CVA tenderness.  No rebound, rigidity, or guarding. Musculoskeletal:   Normal range of  motion in all extremities. No joint effusions.  No lower extremity tenderness.  No edema. Neurologic:   Normal speech and language.  Cranial nerves III through XII intact No pronator drift.  Normal finger-to-nose Motor grossly intact. No acute focal neurologic deficits are appreciated.  NIH stroke scale 0 Skin:    Skin is warm, dry and intact. No rash noted.  No petechiae, purpura, or bullae.  ____________________________________________    LABS (pertinent positives/negatives) (all labs ordered are listed, but only abnormal results are displayed) Labs Reviewed  BASIC METABOLIC PANEL - Abnormal; Notable for the following components:      Result Value   Sodium 132 (*)    CO2 21 (*)    Glucose, Bld 304 (*)    BUN 33 (*)    Creatinine, Ser 1.26 (*)    GFR calc non Af Amer 44 (*)    GFR calc Af Amer 51 (*)    All other components within normal limits  CBC - Abnormal; Notable for the following components:   RBC 5.20 (*)    Hemoglobin 15.5 (*)    All other components within normal limits  URINE CULTURE  URINALYSIS, COMPLETE (UACMP) WITH MICROSCOPIC  TROPONIN I (HIGH SENSITIVITY)   ____________________________________________   EKG  Interpreted by me Normal sinus rhythm rate of 66, normal  axis and intervals.  Poor R wave progression.  Normal ST segments and T waves.  ____________________________________________    RADIOLOGY  Dg Chest 2 View  Result Date: 03/16/2019 CLINICAL DATA:  Two week history of progressive chest tightness and several week history of progressively worsening generalized weakness. Current history of chronic diastolic CHF. Personal history of uterine cancer. EXAM: CHEST - 2 VIEW COMPARISON:  Chest x-rays and CT chest 09/21/2016 and earlier. FINDINGS: Cardiac silhouette upper normal in size, unchanged. Thoracic aorta mildly atherosclerotic, unchanged. Hilar and mediastinal contours otherwise unremarkable. Minimal mild pulmonary venous hypertension without overt edema. Lungs clear. No pleural effusions. Degenerative changes involving the thoracic and upper lumbar spine. IMPRESSION: No acute cardiopulmonary disease. Electronically Signed   By: Evangeline Dakin M.D.   On: 03/16/2019 12:29   Ct Head Wo Contrast  Result Date: 03/16/2019 CLINICAL DATA:  Increased weakness over the past several weeks. EXAM: CT HEAD WITHOUT CONTRAST TECHNIQUE: Contiguous axial images were obtained from the base of the skull through the vertex without intravenous contrast. COMPARISON:  01/09/2017. FINDINGS: Brain: Stable mildly enlarged ventricles and cortical sulci. Stable minimal patchy white matter low density in both cerebral hemispheres. No intracranial hemorrhage, mass lesion or CT evidence of acute infarction. Vascular: No hyperdense vessel or unexpected calcification. Skull: Normal. Negative for fracture or focal lesion. Sinuses/Orbits: Unremarkable. Other: None. IMPRESSION: No acute abnormality. Stable mild diffuse cerebral and cerebellar atrophy and minimal chronic small vessel white matter ischemic changes in both cerebral hemispheres. Electronically Signed   By: Claudie Revering M.D.   On: 03/16/2019 13:16     ____________________________________________   PROCEDURES Procedures  ____________________________________________  DIFFERENTIAL DIAGNOSIS   Dementia, intracranial hemorrhage, subacute stroke, dehydration, UTI  CLINICAL IMPRESSION / ASSESSMENT AND PLAN / ED COURSE  Medications ordered in the ED: Medications  sodium chloride flush (NS) 0.9 % injection 3 mL (3 mLs Intravenous Not Given 03/16/19 1228)  sodium chloride 0.9 % bolus 1,000 mL (0 mLs Intravenous Stopped 03/16/19 1502)    Pertinent labs & imaging results that were available during my care of the patient were reviewed by me and considered in my medical decision making (see chart for details).  Judith Shaw was evaluated in Emergency Department on 03/16/2019 for the symptoms described in the history of present illness. She was evaluated in the context of the global COVID-19 pandemic, which necessitated consideration that the patient might be at risk for infection with the SARS-CoV-2 virus that causes COVID-19. Institutional protocols and algorithms that pertain to the evaluation of patients at risk for COVID-19 are in a state of rapid change based on information released by regulatory bodies including the CDC and federal and state organizations. These policies and algorithms were followed during the patient's care in the ED.   Patient presents with atypical chronic chest tightness and concerns about gradual onset of confusion.  She is nontoxic and pleasant interact with.  I doubt stroke meningitis encephalitis aneurysm vascular dissection or thrombosis.  Considering the patient's symptoms, medical history, and physical examination today, I have low suspicion for ACS, PE, TAD, pneumothorax, carditis, mediastinitis, pneumonia, CHF, or sepsis.  EKG is unremarkable.  Vital signs unremarkable except for low blood pressure for which I will give her IV fluids for hydration and recheck.  Initial labs are okay including a high-sensitivity  troponin which given her chronic symptoms is reliable.  Check urinalysis and a CT scan of the head.  Clinical Course as of Mar 18 1458  Sun Mar 16, 2019  1302 Review of electronic medical record shows that 6 months ago she had a UTI that was culture positive for pansensitive E. coli.   [PS]  1302 Chest x-ray unremarkable   [PS]  1426 CT head unremarkable.  Awaiting urinalysis after which I think patient can be discharged home to follow-up with primary care/neurology.   [PS]  1641 Novel Coronavirus,NAA,(SEND-OUT TO REF LAB - TAT 24-48 hrs); Hosp Order [CT]    Clinical Course User Index [CT] Victorino Dike, FNP [PS] Carrie Mew, MD    ----------------------------------------- 3:06 PM on 03/16/2019 -----------------------------------------  Blood pressure is normalized after IV fluids.  Care of the patient will be signed out to Dr. Rip Harbour to follow-up on orthostatic vital signs and continue IV fluids as necessary, follow-up on urinalysis.  After which patient can be discharged home once symptoms are improved..  ____________________________________________   FINAL CLINICAL IMPRESSION(S) / ED DIAGNOSES    Final diagnoses:  Dehydration  Memory changes     ED Discharge Orders    None      Portions of this note were generated with dragon dictation software. Dictation errors may occur despite best attempts at proofreading.   Carrie Mew, MD 03/16/19 [place]    Carrie Mew, MD 03/19/19 [phone]

## 2019-03-16 NOTE — ED Notes (Signed)
Pt ambulated in hall, O2 saturation ranged from 94-98 during ambulation. PT c/o slight SOB when returning to room

## 2019-03-16 NOTE — ED Notes (Signed)
Pt A&Ox4 in NAD, pt ambulated from bench to bed without assistance after collecting urine sample

## 2019-03-16 NOTE — ED Triage Notes (Signed)
Pt arrived via POV with reports of chest tightness x 2 weeks, increased weakness over the past several weeks. Pt states she has been confused more over the past few weeks and states she missed taking her medications for several days.  Pt states her nephew takes care of her and gives her her medications.  Pt thinks she had a stroke in the past.  Pt is alert and oriented on arrival. Speech clear.

## 2019-03-16 NOTE — ED Provider Notes (Signed)
Patient reports she feels well now.  She wants to go home.  She is not short of breath.  We had her walk her O2 sats were 9697%.  She does have sleep apnea and I believe some of those low readings were while she was sleeping.  She does have a UTI I will give her some Keflex 1 3 times a day for that.  She is not using her CPAP.  This could be part of the reason she is feeling fuzzy.  We will have her start doing that again to.  Have her follow-up with her doctor as well.    Nena Polio, MD 03/16/19 [phone]

## 2019-03-17 LAB — URINE CULTURE: Culture: 70000 — AB

## 2019-03-19 ENCOUNTER — Observation Stay
Admission: EM | Admit: 2019-03-19 | Discharge: 2019-03-22 | Disposition: A | Discharge status: Home / Self Care | Payer: Medicare Other | Attending: Specialist | Admitting: Specialist

## 2019-03-19 ENCOUNTER — Encounter: Payer: Self-pay | Admitting: Emergency Medicine

## 2019-03-19 ENCOUNTER — Emergency Department: Payer: Medicare Other

## 2019-03-19 ENCOUNTER — Other Ambulatory Visit: Payer: Self-pay

## 2019-03-19 DIAGNOSIS — Z0184 Encounter for antibody response examination: Secondary | ICD-10-CM | POA: Insufficient documentation

## 2019-03-19 DIAGNOSIS — M6281 Muscle weakness (generalized): Secondary | ICD-10-CM | POA: Diagnosis not present

## 2019-03-19 DIAGNOSIS — R0789 Other chest pain: Secondary | ICD-10-CM | POA: Insufficient documentation

## 2019-03-19 DIAGNOSIS — Z885 Allergy status to narcotic agent status: Secondary | ICD-10-CM | POA: Insufficient documentation

## 2019-03-19 DIAGNOSIS — F3342 Major depressive disorder, recurrent, in full remission: Secondary | ICD-10-CM | POA: Diagnosis not present

## 2019-03-19 DIAGNOSIS — Z833 Family history of diabetes mellitus: Secondary | ICD-10-CM | POA: Insufficient documentation

## 2019-03-19 DIAGNOSIS — R413 Other amnesia: Secondary | ICD-10-CM | POA: Diagnosis present

## 2019-03-19 DIAGNOSIS — Z7982 Long term (current) use of aspirin: Secondary | ICD-10-CM | POA: Insufficient documentation

## 2019-03-19 DIAGNOSIS — G4733 Obstructive sleep apnea (adult) (pediatric): Secondary | ICD-10-CM | POA: Insufficient documentation

## 2019-03-19 DIAGNOSIS — Z8249 Family history of ischemic heart disease and other diseases of the circulatory system: Secondary | ICD-10-CM | POA: Diagnosis not present

## 2019-03-19 DIAGNOSIS — I110 Hypertensive heart disease with heart failure: Secondary | ICD-10-CM | POA: Diagnosis not present

## 2019-03-19 DIAGNOSIS — Z90710 Acquired absence of both cervix and uterus: Secondary | ICD-10-CM | POA: Insufficient documentation

## 2019-03-19 DIAGNOSIS — Z79899 Other long term (current) drug therapy: Secondary | ICD-10-CM | POA: Insufficient documentation

## 2019-03-19 DIAGNOSIS — Z1159 Encounter for screening for other viral diseases: Secondary | ICD-10-CM | POA: Insufficient documentation

## 2019-03-19 DIAGNOSIS — E785 Hyperlipidemia, unspecified: Secondary | ICD-10-CM | POA: Diagnosis present

## 2019-03-19 DIAGNOSIS — Z9049 Acquired absence of other specified parts of digestive tract: Secondary | ICD-10-CM | POA: Insufficient documentation

## 2019-03-19 DIAGNOSIS — R2681 Unsteadiness on feet: Secondary | ICD-10-CM | POA: Insufficient documentation

## 2019-03-19 DIAGNOSIS — E860 Dehydration: Secondary | ICD-10-CM | POA: Diagnosis not present

## 2019-03-19 DIAGNOSIS — N390 Urinary tract infection, site not specified: Principal | ICD-10-CM | POA: Insufficient documentation

## 2019-03-19 DIAGNOSIS — E1165 Type 2 diabetes mellitus with hyperglycemia: Secondary | ICD-10-CM | POA: Diagnosis not present

## 2019-03-19 DIAGNOSIS — B9689 Other specified bacterial agents as the cause of diseases classified elsewhere: Secondary | ICD-10-CM | POA: Diagnosis present

## 2019-03-19 DIAGNOSIS — I5032 Chronic diastolic (congestive) heart failure: Secondary | ICD-10-CM | POA: Diagnosis present

## 2019-03-19 DIAGNOSIS — R079 Chest pain, unspecified: Secondary | ICD-10-CM

## 2019-03-19 DIAGNOSIS — F419 Anxiety disorder, unspecified: Secondary | ICD-10-CM | POA: Insufficient documentation

## 2019-03-19 DIAGNOSIS — Z8042 Family history of malignant neoplasm of prostate: Secondary | ICD-10-CM | POA: Insufficient documentation

## 2019-03-19 DIAGNOSIS — Z794 Long term (current) use of insulin: Secondary | ICD-10-CM

## 2019-03-19 DIAGNOSIS — E1121 Type 2 diabetes mellitus with diabetic nephropathy: Secondary | ICD-10-CM | POA: Diagnosis not present

## 2019-03-19 DIAGNOSIS — G4730 Sleep apnea, unspecified: Secondary | ICD-10-CM | POA: Diagnosis present

## 2019-03-19 DIAGNOSIS — R739 Hyperglycemia, unspecified: Secondary | ICD-10-CM

## 2019-03-19 DIAGNOSIS — Z8542 Personal history of malignant neoplasm of other parts of uterus: Secondary | ICD-10-CM | POA: Diagnosis not present

## 2019-03-19 DIAGNOSIS — I10 Essential (primary) hypertension: Secondary | ICD-10-CM | POA: Diagnosis present

## 2019-03-19 LAB — BASIC METABOLIC PANEL
Anion gap: 13 (ref 5–15)
BUN: 33 mg/dL — ABNORMAL HIGH (ref 8–23)
CO2: 21 mmol/L — ABNORMAL LOW (ref 22–32)
Calcium: 9.7 mg/dL (ref 8.9–10.3)
Chloride: 94 mmol/L — ABNORMAL LOW (ref 98–111)
Creatinine, Ser: 1.19 mg/dL — ABNORMAL HIGH (ref 0.44–1.00)
GFR calc Af Amer: 54 mL/min — ABNORMAL LOW (ref >60)
GFR calc non Af Amer: 47 mL/min — ABNORMAL LOW (ref >60)
Glucose, Bld: 632 mg/dL (ref 70–99)
Potassium: 4.7 mmol/L (ref 3.5–5.1)
Sodium: 128 mmol/L — ABNORMAL LOW (ref 135–145)

## 2019-03-19 LAB — NOVEL CORONAVIRUS, NAA (HOSP ORDER, SEND-OUT TO REF LAB; TAT 18-24 HRS): SARS-CoV-2, NAA: NOT DETECTED

## 2019-03-19 LAB — CBC
HCT: 44.5 % (ref 36.0–46.0)
Hemoglobin: 15.1 g/dL — ABNORMAL HIGH (ref 12.0–15.0)
MCH: 29.9 pg (ref 26.0–34.0)
MCHC: 33.9 g/dL (ref 30.0–36.0)
MCV: 88.1 fL (ref 80.0–100.0)
Platelets: 254 10*3/uL (ref 150–400)
RBC: 5.05 MIL/uL (ref 3.87–5.11)
RDW: 12.8 % (ref 11.5–15.5)
WBC: 8.6 10*3/uL (ref 4.0–10.5)
nRBC: 0 % (ref 0.0–0.2)

## 2019-03-19 LAB — URINALYSIS, COMPLETE (UACMP) WITH MICROSCOPIC
Bacteria, UA: NONE SEEN
Bilirubin Urine: NEGATIVE
Glucose, UA: >=500 mg/dL — AB
Ketones, ur: 5 mg/dL — AB
Nitrite: NEGATIVE
Protein, ur: NEGATIVE mg/dL
Specific Gravity, Urine: 1.021 (ref 1.005–1.030)
pH: 5 (ref 5.0–8.0)

## 2019-03-19 LAB — SARS CORONAVIRUS 2 BY RT PCR (HOSPITAL ORDER, PERFORMED IN ~~LOC~~ HOSPITAL LAB): SARS Coronavirus 2: NEGATIVE

## 2019-03-19 LAB — GLUCOSE, CAPILLARY: Glucose-Capillary: 435 mg/dL — ABNORMAL HIGH (ref 70–99)

## 2019-03-19 LAB — TROPONIN I (HIGH SENSITIVITY)
Troponin I (High Sensitivity): 5 ng/L (ref <18)
Troponin I (High Sensitivity): 6 ng/L (ref <18)

## 2019-03-19 IMAGING — CR CHEST - 2 VIEW
1 series · 2 of 2 positions shown · non-contrast
Comparison: Radiograph [DATE], radiograph and CT [DATE]

CLINICAL DATA: Chest pain.

EXAM:
CHEST - 2 VIEW

[Series 1: dg chest 2 view · 0.14mm/px · 2 of 2 slices shown]
[im 1/2]
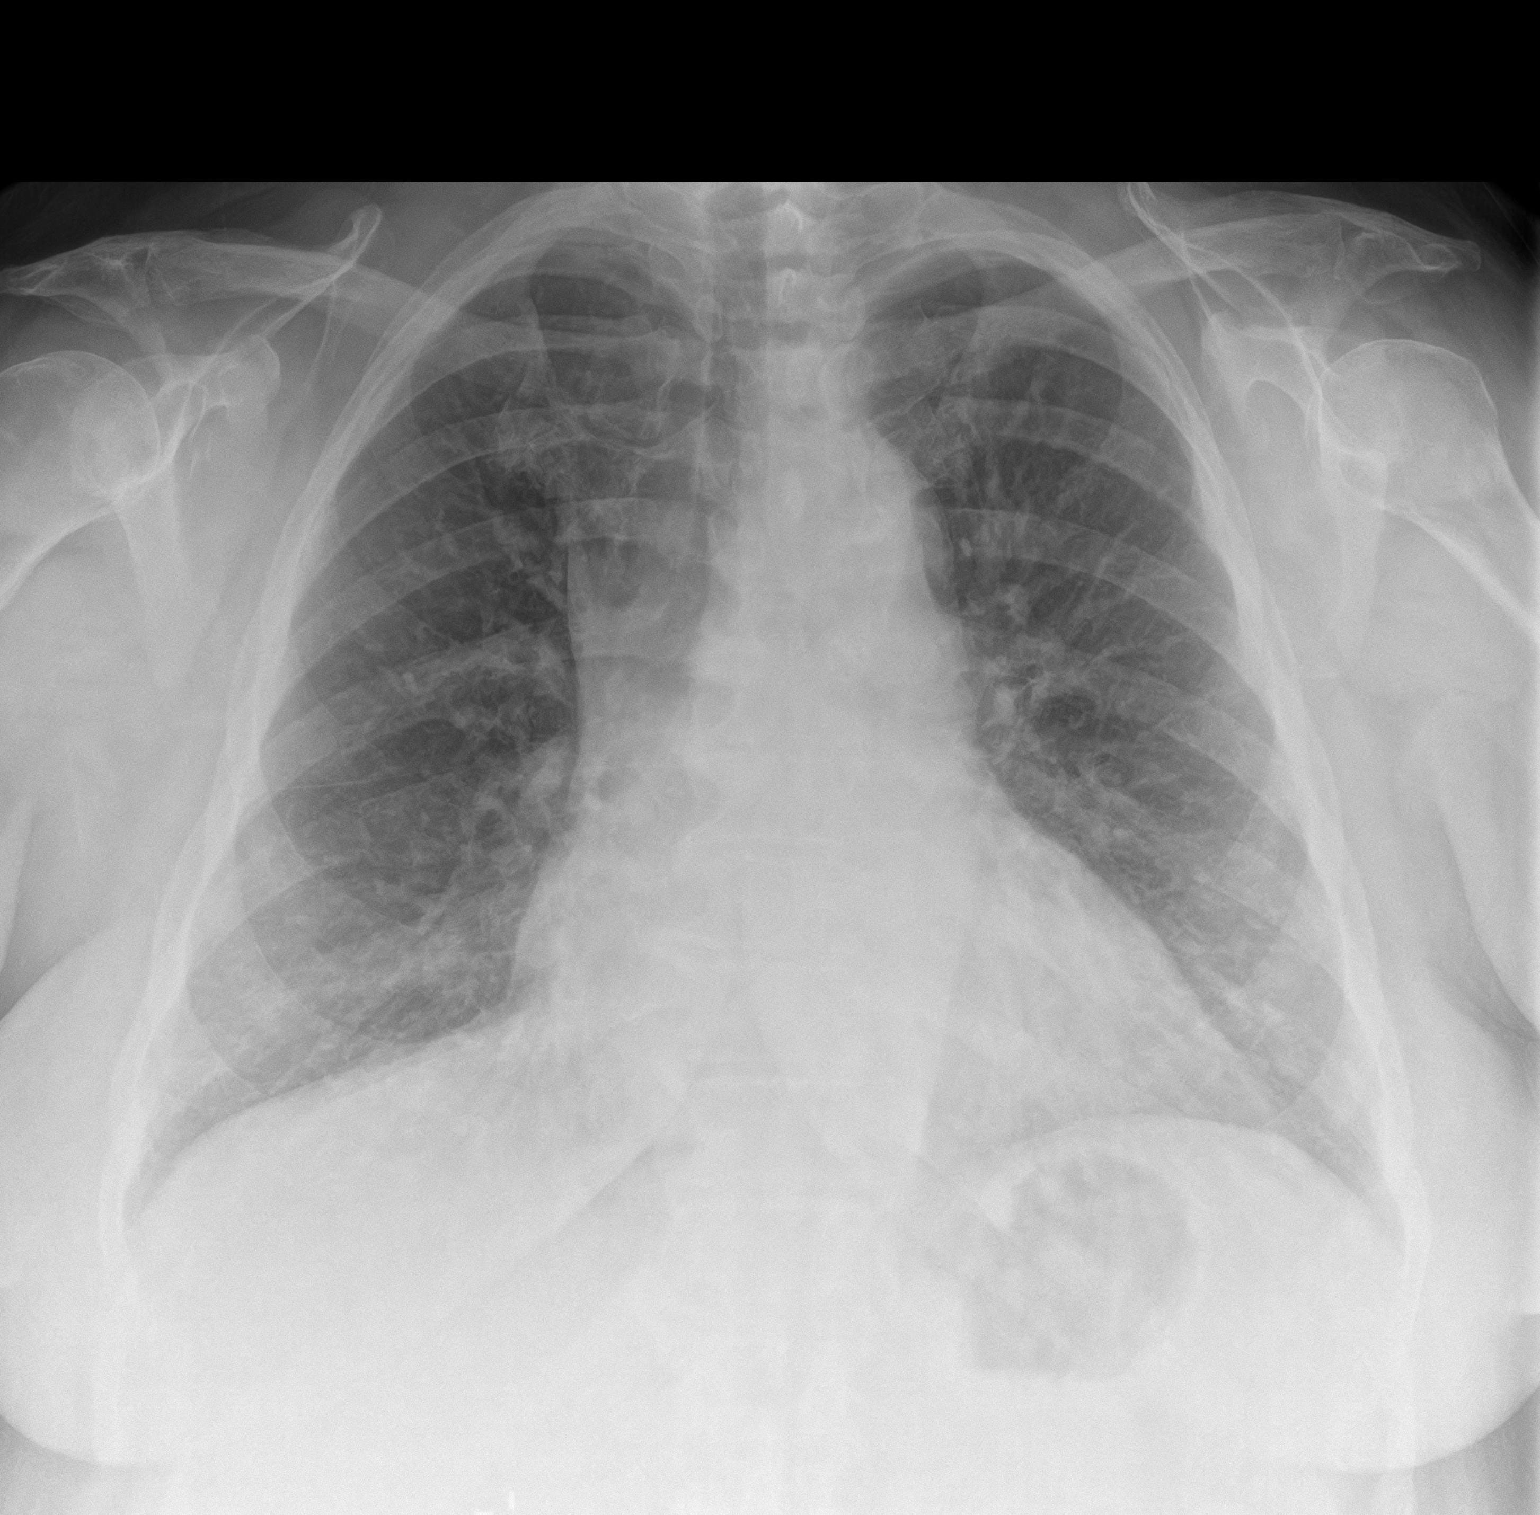
[im 2/2]
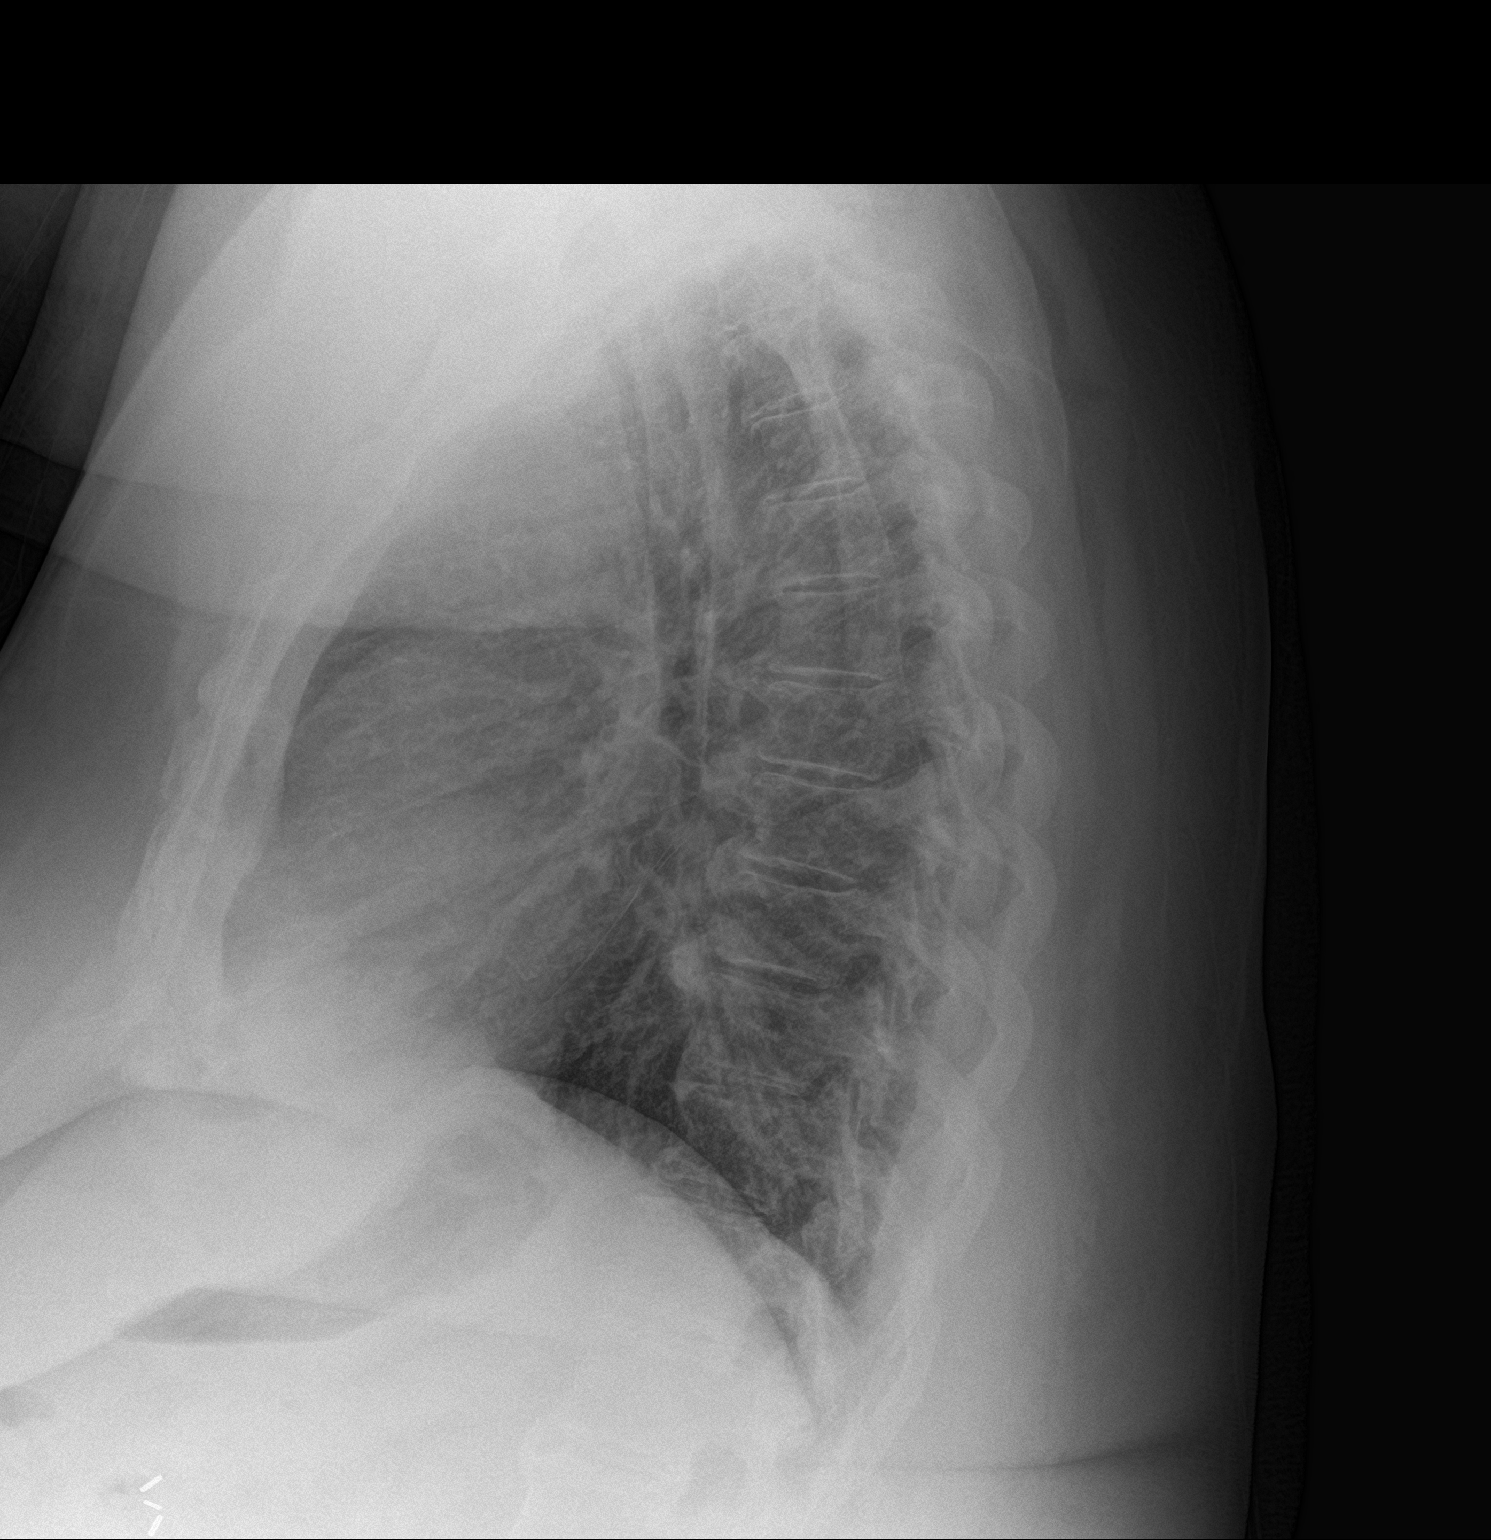

[2 of 2 positions shown; findings below may reference images not displayed]

FINDINGS: The cardiomediastinal contours are normal. Mild chronic vascular
congestion without change from prior. No pulmonary edema. No
consolidation, pleural effusion, or pneumothorax. No acute osseous
abnormalities are seen.
IMPRESSION: Chronic vascular congestion without acute abnormality.

## 2019-03-19 MED ORDER — LORAZEPAM 1 MG PO TABS
1.0000 mg | ORAL_TABLET | Freq: Once | ORAL | Status: AC
  Administered 2019-03-19: 22:00:00 1 mg via ORAL
  Filled 2019-03-19: qty 1

## 2019-03-19 MED ORDER — INSULIN ASPART 100 UNIT/ML ~~LOC~~ SOLN
10.0000 [IU] | Freq: Once | SUBCUTANEOUS | Status: AC
  Administered 2019-03-19: 22:00:00 10 [IU] via INTRAVENOUS
  Filled 2019-03-19: qty 1

## 2019-03-19 MED ORDER — SODIUM CHLORIDE 0.9 % IV SOLN
Freq: Once | INTRAVENOUS | Status: AC
  Administered 2019-03-19: 22:00:00 via INTRAVENOUS

## 2019-03-19 MED ORDER — INSULIN REGULAR HUMAN 100 UNIT/ML IJ SOLN
10.0000 [IU] | Freq: Once | INTRAMUSCULAR | Status: AC
  Administered 2019-03-19: 23:00:00 10 [IU] via INTRAVENOUS
  Filled 2019-03-19: qty 10

## 2019-03-19 MED ORDER — INSULIN ASPART 100 UNIT/ML ~~LOC~~ SOLN
10.0000 [IU] | Freq: Once | SUBCUTANEOUS | Status: AC
  Administered 2019-03-19: 10 [IU] via SUBCUTANEOUS
  Filled 2019-03-19: qty 1

## 2019-03-19 MED ORDER — SODIUM CHLORIDE 0.9 % IV SOLN
1.0000 g | Freq: Once | INTRAVENOUS | Status: AC
  Administered 2019-03-19: 22:00:00 1 g via INTRAVENOUS
  Filled 2019-03-19: qty 10

## 2019-03-19 NOTE — ED Provider Notes (Signed)
[facility] Emergency Department Provider Note       Time seen: ----------------------------------------- 9:04 PM on 03/19/2019 -----------------------------------------   I have reviewed the triage vital signs and the nursing notes.  HISTORY   Chief Complaint Chest Pain    HPI Judith Shaw is a 68 y.o. female with a history of uterine cancer, CHF, depression, diabetes, hyperlipidemia, hypertension who presents to the ED for left-sided chest pressure.  Patient reports pain in her chest is making her feel anxious.  She was here 3 days ago for same.  Symptoms felt better but then came back today.  She reports feeling forgetful.  Past Medical History:  Diagnosis Date  . Cancer ([place])    pt states hx of uterine cancer and had a complete hyst  . CHF (congestive heart failure) ([place])   . Depression   . Diabetes mellitus without complication ([place])   . Hyperlipidemia   . Hypertension     Patient Active Problem List   Diagnosis Date Noted  . Benign essential HTN 07/19/2018  . Fatty liver 05/15/2018  . Hx of adenomatous colonic polyps 05/15/2018  . Type 2 diabetes mellitus with diabetic nephropathy, with long-term current use of insulin ([place]) 05/01/2018  . Tinnitus, bilateral 04/05/2017  . Concussion syndrome 04/03/2017  . Concussion without loss of consciousness 01/24/2017  . Difficulty walking 01/24/2017  . Headache disorder 01/24/2017  . Numbness and tingling 01/24/2017  . Postural urinary incontinence 01/24/2017  . Sepsis ([place]) 09/21/2016  . UTI (urinary tract infection) 09/21/2016  . HTN (hypertension) 09/21/2016  . Diabetes ([place]) 09/21/2016  . Depression 09/21/2016  . Health care maintenance 10/05/2015  . Recurrent major depressive disorder, in full remission ([place]) 06/16/2014  . DM (diabetes mellitus) type II controlled, neurological manifestation ([place]) 06/12/2014  . Morbid obesity ([place]) 06/12/2014  . Hyperlipidemia 03/10/2014  . Chronic  diastolic CHF (congestive heart failure) ([place]) 03/10/2014  . H/O diastolic dysfunction 29/51/8841  . Sleep apnea 03/10/2014  . SOB (shortness of breath) 03/10/2014    Past Surgical History:  Procedure Laterality Date  . ABDOMINAL HYSTERECTOMY    . CHOLECYSTECTOMY      Allergies Codeine  Social History Social History   Tobacco Use  . Smoking status: Never Smoker  . Smokeless tobacco: Never Used  Substance Use Topics  . Alcohol use: No  . Drug use: No   Review of Systems Constitutional: Negative for fever. Cardiovascular: Positive for chest pain, chest pressure Respiratory: Negative for shortness of breath. Gastrointestinal: Negative for abdominal pain, vomiting and diarrhea. Musculoskeletal: Negative for back pain. Skin: Negative for rash. Neurological: Negative for headaches, focal weakness or numbness.  Positive for memory disturbance  All systems negative/normal/unremarkable except as stated in the HPI  ____________________________________________   PHYSICAL EXAM:  VITAL SIGNS: ED Triage Vitals  Enc Vitals Group     BP 03/19/19 1815 139/62     Pulse Rate 03/19/19 1814 75     Resp 03/19/19 1814 18     Temp 03/19/19 1814 98.7 F (37.1 C)     Temp Source 03/19/19 1814 Oral     SpO2 03/19/19 1814 96 %     Weight 03/19/19 1815 250 lb (113.4 kg)     Height 03/19/19 1815 5\' 4"  (1.626 m)     Head Circumference --      Peak Flow --      Pain Score 03/19/19 1815 4     Pain Loc --      Pain Edu? --  Excl. in [place]? --    Constitutional: Alert and oriented. Well appearing and in no distress. Eyes: Conjunctivae are normal. Normal extraocular movements. ENT      Head: Normocephalic and atraumatic.      Nose: No congestion/rhinnorhea.      Mouth/Throat: Mucous membranes are moist.      Neck: No stridor. Cardiovascular: Normal rate, regular rhythm. No murmurs, rubs, or gallops. Respiratory: Normal respiratory effort without tachypnea nor retractions. Breath  sounds are clear and equal bilaterally. No wheezes/rales/rhonchi. Gastrointestinal: Soft and nontender. Normal bowel sounds Musculoskeletal: Nontender with normal range of motion in extremities. No lower extremity tenderness nor edema. Neurologic:  Normal speech and language. No gross focal neurologic deficits are appreciated.  Skin:  Skin is warm, dry and intact. No rash noted. Psychiatric: Mood and affect are normal. Speech and behavior are normal.  ____________________________________________  EKG: Interpreted by me.  Sinus rhythm rate of 73 bpm, left axis deviation, anterior infarct age-indeterminate, normal QT  ____________________________________________  ED COURSE:  As part of my medical decision making, I reviewed the following data within the [place] History obtained from family if available, nursing notes, old chart and ekg, as well as notes from prior ED visits. Patient presented for multiple complaints including chest pain, anxiety, memory loss, and hyperglycemia, we will assess with labs and imaging as indicated at this time.   Procedures  [name] was evaluated in Emergency Department on 03/19/2019 for the symptoms described in the history of present illness. She was evaluated in the context of the global COVID-19 pandemic, which necessitated consideration that the patient might be at risk for infection with the SARS-CoV-2 virus that causes COVID-19. Institutional protocols and algorithms that pertain to the evaluation of patients at risk for COVID-19 are in a state of rapid change based on information released by regulatory bodies including the CDC and federal and state organizations. These policies and algorithms were followed during the patient's care in the ED.  ____________________________________________   LABS (pertinent positives/negatives)  Labs Reviewed  BASIC METABOLIC PANEL - Abnormal; Notable for the following components:      Result Value    Sodium 128 (*)    Chloride 94 (*)    CO2 21 (*)    Glucose, Bld 632 (*)    BUN 33 (*)    Creatinine, Ser 1.19 (*)    GFR calc non Af Amer 47 (*)    GFR calc Af Amer 54 (*)    All other components within normal limits  CBC - Abnormal; Notable for the following components:   Hemoglobin 15.1 (*)    All other components within normal limits  URINALYSIS, COMPLETE (UACMP) WITH MICROSCOPIC - Abnormal; Notable for the following components:   Color, Urine YELLOW (*)    APPearance CLEAR (*)    Glucose, UA >=500 (*)    Hgb urine dipstick SMALL (*)    Ketones, ur 5 (*)    Leukocytes,Ua SMALL (*)    All other components within normal limits  TROPONIN I (HIGH SENSITIVITY)  TROPONIN I (HIGH SENSITIVITY)    RADIOLOGY Images were viewed by me  Chest x-ray IMPRESSION: Chronic vascular congestion without acute abnormality. ____________________________________________   DIFFERENTIAL DIAGNOSIS   Dehydration, electrolyte abnormality, occult infection, MI, unstable angina, anxiety, DKA  FINAL ASSESSMENT AND PLAN  Hyperglycemia, dehydration, chest pain, UTI   Plan: The patient had presented for multiple complaints. Patient's labs did reveal significant hyperglycemia and likely dehydration.  She has pseudohyponatremia.  Blood sugars in the 600s for which she was started on IV fluids, given IV and subcutaneous insulin.  She was also given IV Rocephin for UTI for which she had started on medications as an outpatient patient's imaging was negative for any acute process.  Due to the fact that this is her second visit in 3 days.  I will discuss with the hospitalist for admission.   Laurence Aly, MD    Note: This note was generated in part or whole with voice recognition software. Voice recognition is usually quite accurate but there are transcription errors that can and very often do occur. I apologize for any typographical errors that were not detected and corrected.     Earleen Newport, MD 03/19/19 2129

## 2019-03-19 NOTE — ED Notes (Signed)
Gave pt urine cup with bag.  

## 2019-03-19 NOTE — ED Notes (Signed)
ED TO INPATIENT HANDOFF REPORT  ED Nurse Name and Phone #:  Julianne Rice Name/Age/Gender Judith Shaw 68 y.o. female Room/Bed: ED18A/ED18A  Code Status   Code Status: Prior  Home/SNF/Other Home Patient oriented to: self, place, time and situation Is this baseline? pt has increased confusion  Triage Complete: Triage complete  Chief Complaint Chest and head pressure/anxiety  Triage Note Pt here for chest pressure to left chest.  Pt reports the pain in her chest is making her feel anxious. Was here 3 days ago for same thing.  Pt reports sx felt better and came back today.  Unlabored.  No fever. Pt states "I just want to cry".  C/o memory challenges and feeling forgetful.  Denies being anxious before the chest pain.   Allergies Allergies  Allergen Reactions  . Codeine     Level of Care/Admitting Diagnosis ED Disposition    ED Disposition Condition [facility] [[id number]]  Level of Care: Med-Surg [16]  Covid Evaluation: Asymptomatic Screening Protocol (No Symptoms)  Diagnosis: UTI (urinary tract infection) [269485]  Admitting Physician: Lance Coon [[id number]]  Attending Physician: Lance Coon [[id number]]  PT Class (Do Not Modify): Observation [104]  PT Acc Code (Do Not Modify): Observation [10022]       B Medical/Surgery History Past Medical History:  Diagnosis Date  . Cancer ([place])    pt states hx of uterine cancer and had a complete hyst  . CHF (congestive heart failure) ([place])   . Depression   . Diabetes mellitus without complication ([place])   . Hyperlipidemia   . Hypertension    Past Surgical History:  Procedure Laterality Date  . ABDOMINAL HYSTERECTOMY    . CHOLECYSTECTOMY       A IV Location/Drains/Wounds Patient Lines/Drains/Airways Status   Active Line/Drains/Airways    Name:   Placement date:   Placement time:   Site:   Days:   Peripheral IV 03/16/19 Left Forearm   03/16/19    1202    Forearm   3    Peripheral IV 03/19/19 Left Antecubital   03/19/19    2200    Antecubital   less than 1          Intake/Output Last 24 hours No intake or output data in the 24 hours ending 03/19/19 2254  Labs/Imaging Results for orders placed or performed during the hospital encounter of 03/19/19 (from the past 48 hour(s))  Basic metabolic panel     Status: Abnormal   Collection Time: 03/19/19  6:26 PM  Result Value Ref Range   Sodium 128 (L) 135 - 145 mmol/L   Potassium 4.7 3.5 - 5.1 mmol/L   Chloride 94 (L) 98 - 111 mmol/L   CO2 21 (L) 22 - 32 mmol/L   Glucose, Bld 632 (HH) 70 - 99 mg/dL    Comment: CRITICAL RESULT CALLED TO, READ BACK BY AND VERIFIED WITH LISA THOMPSON 03/19/19 @ 1921  MLK    BUN 33 (H) 8 - 23 mg/dL   Creatinine, Ser 1.19 (H) 0.44 - 1.00 mg/dL   Calcium 9.7 8.9 - 10.3 mg/dL   GFR calc non Af Amer 47 (L) >60 mL/min   GFR calc Af Amer 54 (L) >60 mL/min   Anion gap 13 5 - 15    Comment: Performed at [facility], [address] [place], [place] [address]  CBC     Status: Abnormal   Collection Time: 03/19/19  6:26 PM  Result Value Ref Range   WBC 8.6 4.0 - 10.5 K/uL   RBC 5.05 3.87 - 5.11 MIL/uL   Hemoglobin 15.1 (H) 12.0 - 15.0 g/dL   HCT 44.5 36.0 - 46.0 %   MCV 88.1 80.0 - 100.0 fL   MCH 29.9 26.0 - 34.0 pg   MCHC 33.9 30.0 - 36.0 g/dL   RDW 12.8 11.5 - 15.5 %   Platelets 254 150 - 400 K/uL   nRBC 0.0 0.0 - 0.2 %    Comment: Performed at [facility], [place], [place] [address]  Troponin I (High Sensitivity)     Status: None   Collection Time: 03/19/19  6:26 PM  Result Value Ref Range   Troponin I (High Sensitivity) 5 <18 ng/L    Comment: (NOTE) Elevated high sensitivity troponin I (hsTnI) values and significant  changes across serial measurements may suggest ACS but many other  chronic and acute conditions are known to elevate hsTnI results.  Refer to the "Links" section for chest pain algorithms and additional   guidance. Performed at [facility], [address] [place], [place] [address]   Urinalysis, Complete w Microscopic     Status: Abnormal   Collection Time: 03/19/19  6:26 PM  Result Value Ref Range   Color, Urine YELLOW (A) YELLOW   APPearance CLEAR (A) CLEAR   Specific Gravity, Urine 1.021 1.005 - 1.030   pH 5.0 5.0 - 8.0   Glucose, UA >=500 (A) NEGATIVE mg/dL   Hgb urine dipstick SMALL (A) NEGATIVE   Bilirubin Urine NEGATIVE NEGATIVE   Ketones, ur 5 (A) NEGATIVE mg/dL   Protein, ur NEGATIVE NEGATIVE mg/dL   Nitrite NEGATIVE NEGATIVE   Leukocytes,Ua SMALL (A) NEGATIVE   RBC / HPF 0-5 0 - 5 RBC/hpf   WBC, UA 21-50 0 - 5 WBC/hpf   Bacteria, UA NONE SEEN NONE SEEN   Squamous Epithelial / LPF 0-5 0 - 5    Comment: Performed at [facility], [place], [place] [address]  Troponin I (High Sensitivity)     Status: None   Collection Time: 03/19/19  9:32 PM  Result Value Ref Range   Troponin I (High Sensitivity) 6 <18 ng/L    Comment: (NOTE) Elevated high sensitivity troponin I (hsTnI) values and significant  changes across serial measurements may suggest ACS but many other  chronic and acute conditions are known to elevate hsTnI results.  Refer to the "Links" section for chest pain algorithms and additional  guidance. Performed at [facility], [address] [place], [place] [address]   SARS Coronavirus 2 (CEPHEID - Performed in [facility] hospital lab), Hosp Order     Status: None   Collection Time: 03/19/19  9:32 PM   Specimen: Nasopharyngeal Swab  Result Value Ref Range   SARS Coronavirus 2 NEGATIVE NEGATIVE    Comment: (NOTE) If result is NEGATIVE SARS-CoV-2 target nucleic acids are NOT DETECTED. The SARS-CoV-2 RNA is generally detectable in upper and lower  respiratory specimens during the acute phase of infection. The lowest  concentration of SARS-CoV-2 viral copies this assay can detect is 250  copies / mL. A negative  result does not preclude SARS-CoV-2 infection  and should not be used as the sole basis for treatment or other  patient management decisions.  A negative result may occur with  improper specimen collection / handling, submission of specimen other  than nasopharyngeal swab, presence of viral mutation(s) within the  areas targeted by this assay, and inadequate number of viral copies  (<250 copies / mL). A negative result must be combined with clinical  observations, patient history, and epidemiological information. If result is POSITIVE SARS-CoV-2 target nucleic acids are DETECTED. The SARS-CoV-2 RNA is generally detectable in upper and lower  respiratory specimens dur ing the acute phase of infection.  Positive  results are indicative of active infection with SARS-CoV-2.  Clinical  correlation with patient history and other diagnostic information is  necessary to determine patient infection status.  Positive results do  not rule out bacterial infection or co-infection with other viruses. If result is PRESUMPTIVE POSTIVE SARS-CoV-2 nucleic acids MAY BE PRESENT.   A presumptive positive result was obtained on the submitted specimen  and confirmed on repeat testing.  While 2019 novel coronavirus  (SARS-CoV-2) nucleic acids may be present in the submitted sample  additional confirmatory testing may be necessary for epidemiological  and / or clinical management purposes  to differentiate between  SARS-CoV-2 and other Sarbecovirus currently known to infect humans.  If clinically indicated additional testing with an alternate test  methodology [phone]) is advised. The SARS-CoV-2 RNA is generally  detectable in upper and lower respiratory sp ecimens during the acute  phase of infection. The expected result is Negative. Fact Sheet for Patients:  StrictlyIdeas.no Fact Sheet for Healthcare Providers: BankingDealers.co.za This test is not yet  approved or cleared by the [place] FDA and has been authorized for detection and/or diagnosis of SARS-CoV-2 by FDA under an Emergency Use Authorization (EUA).  This EUA will remain in effect (meaning this test can be used) for the duration of the COVID-19 declaration under Section 564(b)(1) of the Act, 21 U.S.C. section 360bbb-3(b)(1), unless the authorization is terminated or revoked sooner. Performed at [facility], [address] [place], [place] [address]   Glucose, capillary     Status: Abnormal   Collection Time: 03/19/19 10:26 PM  Result Value Ref Range   Glucose-Capillary 435 (H) 70 - 99 mg/dL   Dg Chest 2 View  Result Date: 03/19/2019 CLINICAL DATA:  Chest pain. EXAM: CHEST - 2 VIEW COMPARISON:  Radiograph 03/16/2019, radiograph and CT 09/21/2016 FINDINGS: The cardiomediastinal contours are normal. Mild chronic vascular congestion without change from prior. No pulmonary edema. No consolidation, pleural effusion, or pneumothorax. No acute osseous abnormalities are seen. IMPRESSION: Chronic vascular congestion without acute abnormality. Electronically Signed   By: Keith Rake M.D.   On: 03/19/2019 19:04    Pending Labs [employer] (From admission, onward)    Start     Ordered   Signed and Held  HIV antibody (Routine Testing)  Once,   R     Signed and Held   Signed and Held  CBC  (enoxaparin (LOVENOX)    CrCl >/= 30 ml/min)  Once,   R    Comments: Baseline for enoxaparin therapy IF NOT ALREADY DRAWN.  Notify MD if PLT < 100 K.    Signed and Held   Signed and Held  Creatinine, serum  (enoxaparin (LOVENOX)    CrCl >/= 30 ml/min)  Once,   R    Comments: Baseline for enoxaparin therapy IF NOT ALREADY DRAWN.    Signed and Held   Signed and Held  Creatinine, serum  (enoxaparin (LOVENOX)    CrCl >/= 30 ml/min)  Weekly,   R    Comments: while on enoxaparin therapy    Signed and Held   Signed and Held  Basic metabolic  panel  Tomorrow morning,   R      Signed and Held   Signed and Held  CBC  Tomorrow morning,   R     Signed and Held          Vitals/Pain Today's Vitals   03/19/19 1814 03/19/19 1815 03/19/19 2130 03/19/19 2230  BP:  139/62  130/66  Pulse: 75  62 66  Resp: 18  11 (!) 24  Temp: 98.7 F (37.1 C)     TempSrc: Oral     SpO2: 96%  100% 95%  Weight:  113.4 kg    Height:  5\' 4"  (1.626 m)    PainSc:  4       Isolation Precautions No active isolations  Medications Medications  insulin regular (NOVOLIN R) 100 units/mL injection 10 Units (has no administration in time range)  0.9 %  sodium chloride infusion ( Intravenous New Bag/Given 03/19/19 2202)  cefTRIAXone (ROCEPHIN) 1 g in sodium chloride 0.9 % 100 mL IVPB (1 g Intravenous New Bag/Given 03/19/19 2205)  insulin aspart (novoLOG) injection 10 Units (10 Units Intravenous Given 03/19/19 2202)  insulin aspart (novoLOG) injection 10 Units (10 Units Subcutaneous Given 03/19/19 2203)  LORazepam (ATIVAN) tablet 1 mg (1 mg Oral Given 03/19/19 2203)    Mobility walks with device Low fall risk   Focused Assessments Cardiac Assessment Handoff:    Lab Results  Component Value Date   TROPONINI <0.03 09/11/2018   No results found for: DDIMER Does the Patient currently have chest pain? No     R Recommendations: See Admitting Provider Note  Report given to:   Additional Notes:

## 2019-03-19 NOTE — ED Triage Notes (Signed)
Pt here for chest pressure to left chest.  Pt reports the pain in her chest is making her feel anxious. Was here 3 days ago for same thing.  Pt reports sx felt better and came back today.  Unlabored.  No fever. Pt states "I just want to cry".  C/o memory challenges and feeling forgetful.  Denies being anxious before the chest pain.

## 2019-03-19 NOTE — H&P (Signed)
[place] [facility] [place] NAME: Judith Shaw    MR#:  [id number]  DATE OF BIRTH:  06/09/1951  DATE OF ADMISSION:  03/19/2019  PRIMARY CARE PHYSICIAN: Kirk Ruths, MD   REQUESTING/REFERRING PHYSICIAN: Jimmye Norman, MD  CHIEF COMPLAINT:   Chief Complaint  Patient presents with  . Chest Pain    HISTORY OF PRESENT ILLNESS:  Judith Shaw  is a 68 y.o. female who presents with chief complaint as above.  Patient presents the ED with a complaint of chest pain.  She was recently diagnosed with a UTI.  Here in the ED she is found to have likely persistent UTI.  Her glucose is also significantly elevated.  Her chest pain resolved quickly in the ED, and her cardiac work-up was largely within normal limits.  However, with her significantly elevated glucose and her UTI, hospitalist were called for admission  PAST MEDICAL HISTORY:   Past Medical History:  Diagnosis Date  . Cancer ([facility])    pt states hx of uterine cancer and had a complete hyst  . CHF (congestive heart failure) ([place])   . Depression   . Diabetes mellitus without complication ([place])   . Hyperlipidemia   . Hypertension      PAST SURGICAL HISTORY:   Past Surgical History:  Procedure Laterality Date  . ABDOMINAL HYSTERECTOMY    . CHOLECYSTECTOMY       SOCIAL HISTORY:   Social History   Tobacco Use  . Smoking status: Never Smoker  . Smokeless tobacco: Never Used  Substance Use Topics  . Alcohol use: No     FAMILY HISTORY:   Family History  Problem Relation Age of Onset  . Hypertension Mother   . Heart disease Father   . Prostate cancer Brother   . Diabetes Maternal Aunt   . Kidney cancer Neg Hx   . Bladder Cancer Neg Hx      DRUG ALLERGIES:   Allergies  Allergen Reactions  . Codeine     MEDICATIONS AT HOME:   Prior to Admission medications   Medication Sig Start Date End Date Taking? Authorizing Provider  aspirin EC 81 MG tablet Take 81 mg by mouth  daily.   Yes [provider]  carvedilol (COREG) 3.125 MG tablet Take 3.125 mg by mouth 2 (two) times daily with a meal.   Yes [provider]  cephALEXin (KEFLEX) 500 MG capsule Take 1 capsule (500 mg total) by mouth 2 (two) times daily for 10 days. 03/16/19 03/26/19 Yes Nena Polio, MD  clotrimazole-betamethasone (LOTRISONE) cream Apply 1 application topically 2 (two) times daily.   Yes [provider]  DULoxetine (CYMBALTA) 30 MG capsule Take 30 mg by mouth daily.   Yes [provider]  furosemide (LASIX) 20 MG tablet Take 20 mg every other day in the morning. 11/29/16  Yes [provider]  insulin regular human CONCENTRATED (HUMULIN R) 500 UNIT/ML kwikpen Inject 90 Units into the skin 3 (three) times daily. 06/12/18 06/12/19 Yes [provider]  metFORMIN (GLUCOPHAGE-XR) 500 MG 24 hr tablet Take 1,000 mg by mouth 2 (two) times daily.   Yes [provider]  mirabegron ER (MYRBETRIQ) 25 MG TB24 tablet Take 1 tablet (25 mg total) by mouth daily. 05/22/18  Yes McGowan, Larene Beach A, PA-C  potassium chloride (K-DUR,KLOR-CON) 10 MEQ tablet Take 10 mEq by mouth daily.   Yes [provider]  ramipril (ALTACE) 5 MG capsule Take 5 mg by mouth  daily.   Yes [provider]  simvastatin (ZOCOR) 40 MG tablet Take 40 mg by mouth every morning.    Yes [provider]    REVIEW OF SYSTEMS:  Review of Systems  Constitutional: Negative for chills, fever, malaise/fatigue and weight loss.  HENT: Negative for ear pain, hearing loss and tinnitus.   Eyes: Negative for blurred vision, double vision, pain and redness.  Respiratory: Negative for cough, hemoptysis and shortness of breath.   Cardiovascular: Positive for chest pain. Negative for palpitations, orthopnea and leg swelling.  Gastrointestinal: Negative for abdominal pain, constipation, diarrhea, nausea and vomiting.  Genitourinary: Negative for dysuria, frequency and  hematuria.  Musculoskeletal: Negative for back pain, joint pain and neck pain.  Skin:       No acne, rash, or lesions  Neurological: Negative for dizziness, tremors, focal weakness and weakness.  Endo/Heme/Allergies: Negative for polydipsia. Does not bruise/bleed easily.  Psychiatric/Behavioral: Negative for depression. The patient is not nervous/anxious and does not have insomnia.      VITAL SIGNS:   Vitals:   03/19/19 1814 03/19/19 1815  BP:  139/62  Pulse: 75   Resp: 18   Temp: 98.7 F (37.1 C)   TempSrc: Oral   SpO2: 96%   Weight:  113.4 kg  Height:  5\' 4"  (1.626 m)   Wt Readings from Last 3 Encounters:  03/19/19 113.4 kg  03/16/19 113.4 kg  09/11/18 123.4 kg    PHYSICAL EXAMINATION:  Physical Exam  Vitals reviewed. Constitutional: She is oriented to person, place, and time. She appears well-developed and well-nourished. No distress.  HENT:  Head: Normocephalic and atraumatic.  Mouth/Throat: Oropharynx is clear and moist.  Eyes: Pupils are equal, round, and reactive to light. Conjunctivae and EOM are normal. No scleral icterus.  Neck: Normal range of motion. Neck supple. No JVD present. No thyromegaly present.  Cardiovascular: Normal rate, regular rhythm and intact distal pulses. Exam reveals no gallop and no friction rub.  No murmur heard. Respiratory: Effort normal and breath sounds normal. No respiratory distress. She has no wheezes. She has no rales.  GI: Soft. Bowel sounds are normal. She exhibits no distension. There is no abdominal tenderness.  Musculoskeletal: Normal range of motion.        General: No edema.     Comments: No arthritis, no gout  Lymphadenopathy:    She has no cervical adenopathy.  Neurological: She is alert and oriented to person, place, and time. No cranial nerve deficit.  No dysarthria, no aphasia  Skin: Skin is warm and dry. No rash noted. No erythema.  Psychiatric: She has a normal mood and affect. Her behavior is normal. Judgment and  thought content normal.    LABORATORY PANEL:   CBC Recent Labs  Lab 03/19/19 1826  WBC 8.6  HGB 15.1*  HCT 44.5  PLT 254   ------------------------------------------------------------------------------------------------------------------  Chemistries  Recent Labs  Lab 03/19/19 1826  NA 128*  K 4.7  CL 94*  CO2 21*  GLUCOSE 632*  BUN 33*  CREATININE 1.19*  CALCIUM 9.7   ------------------------------------------------------------------------------------------------------------------  Cardiac Enzymes No results for input(s): TROPONINI in the last 168 hours. ------------------------------------------------------------------------------------------------------------------  RADIOLOGY:  Dg Chest 2 View  Result Date: 03/19/2019 CLINICAL DATA:  Chest pain. EXAM: CHEST - 2 VIEW COMPARISON:  Radiograph 03/16/2019, radiograph and CT 09/21/2016 FINDINGS: The cardiomediastinal contours are normal. Mild chronic vascular congestion without change from prior. No pulmonary edema. No consolidation, pleural effusion, or pneumothorax. No acute osseous abnormalities are seen. IMPRESSION:  Chronic vascular congestion without acute abnormality. Electronically Signed   By: Keith Rake M.D.   On: 03/19/2019 19:04    EKG:   Orders placed or performed during the hospital encounter of 03/19/19  . ED EKG  . ED EKG    IMPRESSION AND PLAN:  Principal Problem:   UTI (urinary tract infection) -patient was on IV Keflex, however we will switch her to Rocephin while here Active Problems:   Type 2 diabetes mellitus with diabetic nephropathy, with long-term current use of insulin (HCC) -sliding scale insulin coverage.  She was given 20 units of insulin in the ED with some improvement in her glucose.  We will give her 10 units IV regular now and recheck her glucose level at 3 AM.   HTN (hypertension) -home dose antihypertensive   Chronic diastolic CHF (congestive heart failure) ([place]) -continue  home meds   Hyperlipidemia -home dose antilipid   Sleep apnea -CPAP nightly  Chart review performed and case discussed with ED provider. Labs, imaging and/or ECG reviewed by provider and discussed with patient/family. Management plans discussed with the patient and/or family.  COVID-19 status: Pending  DVT PROPHYLAXIS: SubQ lovenox   GI PROPHYLAXIS:  None  ADMISSION STATUS: Observation    CODE STATUS: Full Code Status History    Date Active Date Inactive Code Status Order ID Comments User Context   09/22/2016 0218 09/25/2016 1908 Full Code [id number]  Lance Coon, MD Inpatient   Advance Care Planning Activity      TOTAL TIME TAKING CARE OF THIS PATIENT: 40 minutes.   This patient was evaluated in the context of the global COVID-19 pandemic, which necessitated consideration that the patient might be at risk for infection with the SARS-CoV-2 virus that causes COVID-19. Institutional protocols and algorithms that pertain to the evaluation of patients at risk for COVID-19 are in a state of rapid change based on information released by regulatory bodies including the CDC and federal and state organizations. These policies and algorithms were followed to the best of this provider's knowledge to date during the patient's care at this facility.  Ethlyn Daniels 03/19/2019, 10:25 PM  [facility]  Office  [phone]  CC: Primary care physician; Kirk Ruths, MD  Note:  This document was prepared using Dragon voice recognition software and may include unintentional dictation errors.

## 2019-03-20 LAB — BASIC METABOLIC PANEL
Anion gap: 8 (ref 5–15)
BUN: 31 mg/dL — ABNORMAL HIGH (ref 8–23)
CO2: 25 mmol/L (ref 22–32)
Calcium: 9.3 mg/dL (ref 8.9–10.3)
Chloride: 99 mmol/L (ref 98–111)
Creatinine, Ser: 1.06 mg/dL — ABNORMAL HIGH (ref 0.44–1.00)
GFR calc Af Amer: >60 mL/min (ref >60)
GFR calc non Af Amer: 54 mL/min — ABNORMAL LOW (ref >60)
Glucose, Bld: 473 mg/dL — ABNORMAL HIGH (ref 70–99)
Potassium: 3.8 mmol/L (ref 3.5–5.1)
Sodium: 132 mmol/L — ABNORMAL LOW (ref 135–145)

## 2019-03-20 LAB — GLUCOSE, CAPILLARY
Glucose-Capillary: 425 mg/dL — ABNORMAL HIGH (ref 70–99)
Glucose-Capillary: 351 mg/dL — ABNORMAL HIGH (ref 70–99)
Glucose-Capillary: 378 mg/dL — ABNORMAL HIGH (ref 70–99)
Glucose-Capillary: 446 mg/dL — ABNORMAL HIGH (ref 70–99)
Glucose-Capillary: 330 mg/dL — ABNORMAL HIGH (ref 70–99)
Glucose-Capillary: 318 mg/dL — ABNORMAL HIGH (ref 70–99)
Glucose-Capillary: 189 mg/dL — ABNORMAL HIGH (ref 70–99)

## 2019-03-20 LAB — CBC
HCT: 41.1 % (ref 36.0–46.0)
Hemoglobin: 14 g/dL (ref 12.0–15.0)
MCH: 29.8 pg (ref 26.0–34.0)
MCHC: 34.1 g/dL (ref 30.0–36.0)
MCV: 87.4 fL (ref 80.0–100.0)
Platelets: 237 10*3/uL (ref 150–400)
RBC: 4.7 MIL/uL (ref 3.87–5.11)
RDW: 12.8 % (ref 11.5–15.5)
WBC: 8.1 10*3/uL (ref 4.0–10.5)
nRBC: 0 % (ref 0.0–0.2)

## 2019-03-20 LAB — GLUCOSE, RANDOM: Glucose, Bld: 458 mg/dL — ABNORMAL HIGH (ref 70–99)

## 2019-03-20 MED ORDER — INSULIN ASPART 100 UNIT/ML ~~LOC~~ SOLN
50.0000 [IU] | Freq: Two times a day (BID) | SUBCUTANEOUS | Status: DC
  Administered 2019-03-20 – 2019-03-21 (×2): 50 [IU] via SUBCUTANEOUS
  Filled 2019-03-20 (×2): qty 1

## 2019-03-20 MED ORDER — INSULIN ASPART 100 UNIT/ML ~~LOC~~ SOLN
18.0000 [IU] | Freq: Once | SUBCUTANEOUS | Status: AC
  Administered 2019-03-20: 06:00:00 18 [IU] via SUBCUTANEOUS
  Filled 2019-03-20: qty 1

## 2019-03-20 MED ORDER — ALPRAZOLAM 0.25 MG PO TABS
0.2500 mg | ORAL_TABLET | Freq: Once | ORAL | Status: AC
  Administered 2019-03-20: 0.25 mg via ORAL
  Filled 2019-03-20: qty 1

## 2019-03-20 MED ORDER — SODIUM CHLORIDE 0.9 % IV SOLN
1.0000 g | INTRAVENOUS | Status: DC
  Administered 2019-03-20 – 2019-03-21 (×2): 1 g via INTRAVENOUS
  Filled 2019-03-20: qty 10
  Filled 2019-03-20 (×2): qty 1

## 2019-03-20 MED ORDER — ONDANSETRON HCL 4 MG PO TABS: 4.0000 mg | ORAL_TABLET | Freq: Four times a day (QID) | ORAL | Status: DC | PRN

## 2019-03-20 MED ORDER — INSULIN REGULAR HUMAN 100 UNIT/ML IJ SOLN
40.0000 [IU] | Freq: Two times a day (BID) | INTRAMUSCULAR | Status: DC
  Administered 2019-03-20: 40 [IU] via SUBCUTANEOUS
  Filled 2019-03-20: qty 10

## 2019-03-20 MED ORDER — ASPIRIN EC 81 MG PO TBEC
81.0000 mg | DELAYED_RELEASE_TABLET | Freq: Every day | ORAL | Status: DC
  Administered 2019-03-20 – 2019-03-22 (×3): 81 mg via ORAL
  Filled 2019-03-20 (×3): qty 1

## 2019-03-20 MED ORDER — MIRABEGRON ER 25 MG PO TB24
25.0000 mg | ORAL_TABLET | Freq: Every day | ORAL | Status: DC
  Administered 2019-03-20 – 2019-03-22 (×3): 25 mg via ORAL
  Filled 2019-03-20 (×3): qty 1

## 2019-03-20 MED ORDER — CARVEDILOL 3.125 MG PO TABS
3.1250 mg | ORAL_TABLET | Freq: Two times a day (BID) | ORAL | Status: DC
  Administered 2019-03-20 – 2019-03-22 (×3): 3.125 mg via ORAL
  Filled 2019-03-20 (×4): qty 1

## 2019-03-20 MED ORDER — ENOXAPARIN SODIUM 40 MG/0.4ML ~~LOC~~ SOLN
40.0000 mg | Freq: Two times a day (BID) | SUBCUTANEOUS | Status: DC
  Administered 2019-03-20 – 2019-03-22 (×5): 40 mg via SUBCUTANEOUS
  Filled 2019-03-20 (×5): qty 0.4

## 2019-03-20 MED ORDER — DULOXETINE HCL 30 MG PO CPEP
30.0000 mg | ORAL_CAPSULE | Freq: Every day | ORAL | Status: DC
  Administered 2019-03-20 – 2019-03-22 (×3): 30 mg via ORAL
  Filled 2019-03-20 (×3): qty 1

## 2019-03-20 MED ORDER — SIMVASTATIN 20 MG PO TABS
40.0000 mg | ORAL_TABLET | Freq: Every morning | ORAL | Status: DC
  Administered 2019-03-20 – 2019-03-22 (×3): 40 mg via ORAL
  Filled 2019-03-20 (×3): qty 2

## 2019-03-20 MED ORDER — ONDANSETRON HCL 4 MG/2ML IJ SOLN: 4.0000 mg | Freq: Four times a day (QID) | INTRAMUSCULAR | Status: DC | PRN

## 2019-03-20 MED ORDER — ACETAMINOPHEN 325 MG PO TABS: 650.0000 mg | ORAL_TABLET | Freq: Four times a day (QID) | ORAL | Status: DC | PRN

## 2019-03-20 MED ORDER — ACETAMINOPHEN 650 MG RE SUPP: 650.0000 mg | Freq: Four times a day (QID) | RECTAL | Status: DC | PRN

## 2019-03-20 MED ORDER — INSULIN ASPART 100 UNIT/ML ~~LOC~~ SOLN
0.0000 [IU] | Freq: Three times a day (TID) | SUBCUTANEOUS | Status: DC
  Administered 2019-03-20: 3 [IU] via SUBCUTANEOUS
  Administered 2019-03-20: 11 [IU] via SUBCUTANEOUS
  Administered 2019-03-20: 18 [IU] via SUBCUTANEOUS
  Administered 2019-03-20 – 2019-03-21 (×2): 15 [IU] via SUBCUTANEOUS
  Administered 2019-03-21: 17:00:00 8 [IU] via SUBCUTANEOUS
  Administered 2019-03-21: 15 [IU] via SUBCUTANEOUS
  Administered 2019-03-21: 11 [IU] via SUBCUTANEOUS
  Administered 2019-03-22: 08:00:00 3 [IU] via SUBCUTANEOUS
  Filled 2019-03-20 (×8): qty 1

## 2019-03-20 NOTE — Progress Notes (Signed)
[facility] [place] NAME: Angelmarie Ponzo    MR#:  [id number]  DATE OF BIRTH:  May 26, 1951  SUBJECTIVE:  CHIEF COMPLAINT:   Chief Complaint  Patient presents with  . Chest Pain   No new complaint this morning.  No chest pain.  No fevers. REVIEW OF SYSTEMS:  Review of Systems  Constitutional: Negative for chills and fever.  HENT: Negative for hearing loss and tinnitus.   Eyes: Negative for blurred vision and double vision.  Respiratory: Negative for cough and shortness of breath.   Cardiovascular: Negative for chest pain and palpitations.  Gastrointestinal: Negative for heartburn and nausea.  Genitourinary: Negative for dysuria and urgency.  Musculoskeletal: Negative for myalgias and neck pain.  Skin: Negative for itching and rash.  Neurological: Negative for dizziness and headaches.  Psychiatric/Behavioral: Negative for depression and hallucinations.    DRUG ALLERGIES:   Allergies  Allergen Reactions  . Codeine    VITALS:  Blood pressure (!) 120/35, pulse 66, temperature 98.4 F (36.9 C), resp. rate 18, height 5\' 4"  (1.626 m), weight 113.4 kg, SpO2 94 %. PHYSICAL EXAMINATION:    Physical Exam  Constitutional: She is oriented to person, place, and time. She appears well-developed.  HENT:  Head: Normocephalic.  Right Ear: External ear normal.  Mouth/Throat: Oropharynx is clear and moist.  Eyes: Pupils are equal, round, and reactive to light. Right eye exhibits no discharge.  Neck: Normal range of motion. No thyromegaly present.  Cardiovascular: Normal rate, regular rhythm and normal heart sounds.  Respiratory: Effort normal and breath sounds normal. She has no wheezes.  GI: Soft. Bowel sounds are normal. There is no abdominal tenderness.  Musculoskeletal: Normal range of motion.        General: No edema.  Neurological: She is alert and oriented to person, place, and time. No cranial nerve deficit.  Skin: Skin is warm. She is  not diaphoretic. No erythema.  Psychiatric: She has a normal mood and affect. Her behavior is normal.     LABORATORY PANEL:  Female CBC Recent Labs  Lab 03/20/19 0311  WBC 8.1  HGB 14.0  HCT 41.1  PLT 237   ------------------------------------------------------------------------------------------------------------------ Chemistries  Recent Labs  Lab 03/20/19 0311 03/20/19 1233  NA 132*  --   K 3.8  --   CL 99  --   CO2 25  --   GLUCOSE 473* 458*  BUN 31*  --   CREATININE 1.06*  --   CALCIUM 9.3  --    RADIOLOGY:  Dg Chest 2 View  Result Date: 03/19/2019 CLINICAL DATA:  Chest pain. EXAM: CHEST - 2 VIEW COMPARISON:  Radiograph 03/16/2019, radiograph and CT 09/21/2016 FINDINGS: The cardiomediastinal contours are normal. Mild chronic vascular congestion without change from prior. No pulmonary edema. No consolidation, pleural effusion, or pneumothorax. No acute osseous abnormalities are seen. IMPRESSION: Chronic vascular congestion without acute abnormality. Electronically Signed   By: Keith Rake M.D.   On: 03/19/2019 19:04   ASSESSMENT AND PLAN:  1.  UTI (urinary tract infection) -patient was on Keflex prior to admission  Being treated with IV Rocephin pending results of urine culture and sensitivities   2.  Uncontrolled diabetes mellitus Resumed long-acting insulin.  Gradually titrate dose while monitoring blood sugars closely.  3.  Hypertension Continue blood pressure meds and monitor.  4.  Chronic diastolic CHF this morning .  Stable.  Cose.  We will give her 10 units IV regular now and recheck  her glucose level at 3 AM.   5.  Hyperlipidemia -home dose antilipid   6. Sleep apnea -CPAP nightly  DVT prophylaxis; Lovenox  All the records are reviewed and case discussed with Care Management/Social Worker. Management plans discussed with the patient, family and they are in agreement.  CODE STATUS: Full Code  TOTAL TIME TAKING CARE OF THIS PATIENT: 37 minutes.    More than 50% of the time was spent in counseling/coordination of care: YES  POSSIBLE D/C IN 2 DAYS, DEPENDING ON CLINICAL CONDITION.   Candid Bovey M.D on 03/20/2019 at 2:45 PM  Between 7am to 6pm - Pager - [phone]  After 6pm go to [email or web address] - Technical brewer [facility]  Office  [phone]  CC: Primary care physician; Kirk Ruths, MD  Note: This dictation was prepared with Dragon dictation along with smaller phrase technology. Any transcriptional errors that result from this process are unintentional.

## 2019-03-20 NOTE — Progress Notes (Addendum)
PHARMACIST - PHYSICIAN COMMUNICATION  CONCERNING:  Enoxaparin (Lovenox) for DVT Prophylaxis   RECOMMENDATION: Patient was prescribed enoxaprin 40mg  q24 hours for VTE prophylaxis.   Filed Weights   03/19/19 1815  Weight: 250 lb (113.4 kg)    Body mass index is 42.91 kg/m.  Estimated Creatinine Clearance: 55.9 mL/min (A) (by C-G formula based on SCr of 1.19 mg/dL (H)).  Based on [place] patient is candidate for enoxaparin 40mg  every 12 hour dosing due to BMI being >40.  DESCRIPTION: Pharmacy has adjusted enoxaparin dose per [facility] policy.  Patient is now receiving enoxaparin 40mg  every 12 hours.   Pernell Dupre, PharmD, BCPS Clinical Pharmacist 03/20/2019 12:51 AM

## 2019-03-20 NOTE — Progress Notes (Signed)
Pt Dinner BS was 330. Pt ordered to get 50 units of novolog insulin plus an additional 11 units of novolog per sliding scaled. MD notified of pt BS and exact amount of insulin scheduled to be given. I asked if her would like to make adjustments. MD stated to give 61 total units of insulin. Will give insulin and continue to monitor pt.

## 2019-03-20 NOTE — Progress Notes (Addendum)
Inpatient Diabetes Program Recommendations  AACE/ADA: New Consensus Statement on Inpatient Glycemic Control (2015)  Target Ranges:  Prepandial:   less than 140 mg/dL      Peak postprandial:   less than 180 mg/dL (1-2 hours)      Critically ill patients:  140 - 180 mg/dL   Lab Results  Component Value Date   GLUCAP 425 (H) 03/20/2019   HGBA1C 10.5 (H) 09/22/2016    Review of Glycemic Control Results for Judith Shaw, Judith Shaw (MRN [id number]) as of 03/20/2019 07:51  Ref. Range 03/19/2019 22:26 03/20/2019 00:04 03/20/2019 02:58  Glucose-Capillary Latest Ref Range: 70 - 99 mg/dL 435 (H) 351 (H) 425 (H)   Diabetes history: DM 2- A1C in 12/19 was 13.9% Outpatient Diabetes medications:  U500 insulin 90 units tid with meals Current orders for Inpatient glycemic control:  Novolog moderate tid with meals and Hs  Inpatient Diabetes Program Recommendations:    Per medication reconciliation, patient was ordered U500 insulin 90 units tid with meals. However it notes that patient was only taking if blood sugar high. Outpatient MD recommended in 01/2019 that she take 40 units U500 bid. Consider starting U500 40 units bid (may need to get pharmacy to put this in since it is a high alert medication).   Per outpatient notes, patient often forgets to take insulin and check blood sugars.  Will follow.  Thanks,  Adah Perl, RN, BC-ADM Inpatient Diabetes Coordinator Pager [phone] (8a-5p)   [phone]: Spoke with patient regarding home insulin and glycemic control.  She states that she was not taking insulin as prescribed when blood sugars were in the 200 range.  Explained that insulin is often what is keeping her blood sugars lower and that she needs to take as prescribed.  I did advise her to ask her Endocrinologist about reducing doses at a certain glucose value and also asking when U500 should be held.  Patient uses glucose sensor but states that hers was expired.  She was waiting until pay day to  purchase new sensor stating that it costs 137$ for every 14 day sensor.  I am unsure why insurance will not pay for sensors and advised patient to f/u with insurance company and MD to see if further documentation is needed to cover the sensors and make more affordable.  Patient appreciative of information.  Needs to f/u with Endocrinolgist ASAP.

## 2019-03-20 NOTE — Progress Notes (Signed)
PT Cancellation Note  Patient Details Name: Judith Shaw MRN: [id number] DOB: [birth year]   Cancelled Treatment:    Reason Eval/Treat Not Completed: Medical issues which prohibited therapy;Other (comment)(Patient consult received and reviewed. Patient's glucose outside therapeutic range at this time. Will continue to monitor patient and will attempt again at a later time/date when medically stable.)  Janna Arch, PT, DPT   03/20/2019, 1:34 PM

## 2019-03-21 LAB — BASIC METABOLIC PANEL
Anion gap: 9 (ref 5–15)
BUN: 30 mg/dL — ABNORMAL HIGH (ref 8–23)
CO2: 24 mmol/L (ref 22–32)
Calcium: 8.9 mg/dL (ref 8.9–10.3)
Chloride: 101 mmol/L (ref 98–111)
Creatinine, Ser: 0.94 mg/dL (ref 0.44–1.00)
GFR calc Af Amer: >60 mL/min (ref >60)
GFR calc non Af Amer: >60 mL/min (ref >60)
Glucose, Bld: 285 mg/dL — ABNORMAL HIGH (ref 70–99)
Potassium: 4.2 mmol/L (ref 3.5–5.1)
Sodium: 134 mmol/L — ABNORMAL LOW (ref 135–145)

## 2019-03-21 LAB — CBC
HCT: 44.6 % (ref 36.0–46.0)
Hemoglobin: 14.9 g/dL (ref 12.0–15.0)
MCH: 29.7 pg (ref 26.0–34.0)
MCHC: 33.4 g/dL (ref 30.0–36.0)
MCV: 88.8 fL (ref 80.0–100.0)
Platelets: 238 10*3/uL (ref 150–400)
RBC: 5.02 MIL/uL (ref 3.87–5.11)
RDW: 12.9 % (ref 11.5–15.5)
WBC: 7.8 10*3/uL (ref 4.0–10.5)
nRBC: 0 % (ref 0.0–0.2)

## 2019-03-21 LAB — GLUCOSE, CAPILLARY
Glucose-Capillary: 344 mg/dL — ABNORMAL HIGH (ref 70–99)
Glucose-Capillary: 356 mg/dL — ABNORMAL HIGH (ref 70–99)
Glucose-Capillary: 293 mg/dL — ABNORMAL HIGH (ref 70–99)
Glucose-Capillary: 363 mg/dL — ABNORMAL HIGH (ref 70–99)

## 2019-03-21 LAB — HIV ANTIBODY (ROUTINE TESTING W REFLEX): HIV Screen 4th Generation wRfx: NONREACTIVE

## 2019-03-21 LAB — MAGNESIUM: Magnesium: 1.8 mg/dL (ref 1.7–2.4)

## 2019-03-21 MED ORDER — SODIUM CHLORIDE 0.9 % IV SOLN
INTRAVENOUS | Status: DC | PRN
  Administered 2019-03-21: 500 mL via INTRAVENOUS

## 2019-03-21 MED ORDER — INSULIN REGULAR HUMAN (CONC) 500 UNIT/ML ~~LOC~~ SOPN
40.0000 [IU] | PEN_INJECTOR | Freq: Two times a day (BID) | SUBCUTANEOUS | Status: DC
  Administered 2019-03-21 – 2019-03-22 (×3): 40 [IU] via SUBCUTANEOUS
  Filled 2019-03-21: qty 3

## 2019-03-21 NOTE — Progress Notes (Signed)
[facility] [place] NAME: Jakiah Bienaime    MR#:  [id number]  DATE OF BIRTH:  04-03-1951  SUBJECTIVE:  CHIEF COMPLAINT:   Chief Complaint  Patient presents with  . Chest Pain   No new complaint this morning.  No chest pain.  No fevers. REVIEW OF SYSTEMS:  Review of Systems  Constitutional: Negative for chills and fever.  HENT: Negative for hearing loss and tinnitus.   Eyes: Negative for blurred vision and double vision.  Respiratory: Negative for cough and shortness of breath.   Cardiovascular: Negative for chest pain and palpitations.  Gastrointestinal: Negative for heartburn and nausea.  Genitourinary: Negative for dysuria and urgency.  Musculoskeletal: Negative for myalgias and neck pain.  Skin: Negative for itching and rash.  Neurological: Negative for dizziness and headaches.  Psychiatric/Behavioral: Negative for depression and hallucinations.    DRUG ALLERGIES:   Allergies  Allergen Reactions  . Codeine    VITALS:  Blood pressure (!) 128/57, pulse 62, temperature 98.3 F (36.8 C), resp. rate 20, height 5\' 4"  (1.626 m), weight 113.4 kg, SpO2 96 %. PHYSICAL EXAMINATION:    Physical Exam  Constitutional: She is oriented to person, place, and time. She appears well-developed.  HENT:  Head: Normocephalic.  Right Ear: External ear normal.  Mouth/Throat: Oropharynx is clear and moist.  Eyes: Pupils are equal, round, and reactive to light. Right eye exhibits no discharge.  Neck: Normal range of motion. No thyromegaly present.  Cardiovascular: Normal rate, regular rhythm and normal heart sounds.  Respiratory: Effort normal and breath sounds normal. She has no wheezes.  GI: Soft. Bowel sounds are normal. There is no abdominal tenderness.  Musculoskeletal: Normal range of motion.        General: No edema.  Neurological: She is alert and oriented to person, place, and time. No cranial nerve deficit.  Skin: Skin is warm. She is  not diaphoretic. No erythema.  Psychiatric: She has a normal mood and affect. Her behavior is normal.     LABORATORY PANEL:  Female CBC Recent Labs  Lab 03/21/19 0512  WBC 7.8  HGB 14.9  HCT 44.6  PLT 238   ------------------------------------------------------------------------------------------------------------------ Chemistries  Recent Labs  Lab 03/21/19 0512  NA 134*  K 4.2  CL 101  CO2 24  GLUCOSE 285*  BUN 30*  CREATININE 0.94  CALCIUM 8.9  MG 1.8   RADIOLOGY:  No results found. ASSESSMENT AND PLAN:  1.  UTI (urinary tract infection) -patient was on Keflex prior to admission  Being treated with IV Rocephin pending results of urine culture and sensitivities   2.  Uncontrolled diabetes mellitus Resumed long-acting insulin U 500.  Gradually titrate dose while monitoring blood sugars closely.  3.  Hypertension Continue blood pressure meds and monitor.  4.  Chronic diastolic CHF   Stable.    5.  Hyperlipidemia -home dose antilipid   6. Sleep apnea -CPAP nightly  DVT prophylaxis; Lovenox  All the records are reviewed and case discussed with Care Management/Social Worker. Management plans discussed with the patient, family and they are in agreement.  CODE STATUS: Full Code  TOTAL TIME TAKING CARE OF THIS PATIENT: 34 minutes.   More than 50% of the time was spent in counseling/coordination of care: YES  POSSIBLE D/C IN 2 DAYS, DEPENDING ON CLINICAL CONDITION.   Jatara Huettner M.D on 03/21/2019 at 2:56 PM  Between 7am to 6pm - Pager - [phone]  After 6pm go to [email or web address] -  password Airline pilot  [employer] [facility]  Office  [phone]  CC: Primary care physician; Kirk Ruths, MD  Note: This dictation was prepared with Dragon dictation along with smaller phrase technology. Any transcriptional errors that result from this process are unintentional.

## 2019-03-21 NOTE — Evaluation (Signed)
Physical Therapy Evaluation Patient Details Name: Judith Shaw MRN: [id number] DOB: [birth year] Today's Date: 03/21/2019   History of Present Illness  Pt admitted for UTI with complaints of chest tightness and weakness. Pt with history of cancer, CHF, depression, DM and HTN. Pt with BG at 285 this date.  Clinical Impression  Pt is a pleasant 68 year old female who was admitted for UTI. Pt demonstrates all bed mobility/transfers/ambulation at baseline level. Reports she feels back to baseline level. Pt does not require any further PT needs at this time. Pt will be dc in house and does not require follow up. RN aware. Will dc current orders.     Follow Up Recommendations No PT follow up    Equipment Recommendations  None recommended by PT    Recommendations for Other Services       Precautions / Restrictions Precautions Precautions: None Restrictions Weight Bearing Restrictions: No      Mobility  Bed Mobility               General bed mobility comments: not performed as pt received seated in recliner  Transfers Overall transfer level: Independent               General transfer comment: safe technique with no need for B UE. Once standing, able to demonstrate upright balance  Ambulation/Gait Ambulation/Gait assistance: Supervision Gait Distance (Feet): 40 Feet Assistive device: None Gait Pattern/deviations: Step-through pattern     General Gait Details: ambulated in room with no AD. No LOB noted. Didn't reach out for furniture. Has no complaints of chest pain.  Stairs            Wheelchair Mobility    Modified Rankin (Stroke Patients Only)       Balance Overall balance assessment: Independent                                           Pertinent Vitals/Pain Pain Assessment: No/denies pain    Home Living Family/patient expects to be discharged to:: Private residence Living Arrangements: Other relatives(brother and has nephew  for support) Available Help at Discharge: Family Type of Home: House Home Access: Level entry     Home Layout: One level(1 step to enter kitchen) Home Equipment: Gilford Rile - 2 wheels;Cane - single point      Prior Function Level of Independence: Independent with assistive device(s)         Comments: uses SPC for all mobility, reports no falls in past year     Hand Dominance        Extremity/Trunk Assessment   Upper Extremity Assessment Upper Extremity Assessment: Overall WFL for tasks assessed    Lower Extremity Assessment Lower Extremity Assessment: Overall WFL for tasks assessed       Communication   Communication: No difficulties  Cognition Arousal/Alertness: Awake/alert Behavior During Therapy: WFL for tasks assessed/performed Overall Cognitive Status: Within Functional Limits for tasks assessed                                        General Comments      Exercises     Assessment/Plan    PT Assessment Patent does not need any further PT services  PT Problem List         PT Treatment  Interventions      PT Goals (Current goals can be found in the Care Plan section)  Acute Rehab PT Goals Patient Stated Goal: to go home PT Goal Formulation: All assessment and education complete, DC therapy Time For Goal Achievement: 03/21/19 Potential to Achieve Goals: Good    Frequency     Barriers to discharge        Co-evaluation               AM-PAC PT "6 Clicks" Mobility  Outcome Measure Help needed turning from your back to your side while in a flat bed without using bedrails?: None Help needed moving from lying on your back to sitting on the side of a flat bed without using bedrails?: None Help needed moving to and from a bed to a chair (including a wheelchair)?: None Help needed standing up from a chair using your arms (e.g., wheelchair or bedside chair)?: None Help needed to walk in hospital room?: None Help needed climbing 3-5  steps with a railing? : None 6 Click Score: 24    End of Session   Activity Tolerance: Patient tolerated treatment well Patient left: in chair Nurse Communication: Mobility status PT Visit Diagnosis: Muscle weakness (generalized) (M62.81);Unsteadiness on feet (R26.81)    Time: 9977-4142 PT Time Calculation (min) (ACUTE ONLY): 8 min   Charges:   PT Evaluation $PT Eval Low Complexity: 1 Low          Greggory Stallion, PT, DPT [phone]   Judith Shaw 03/21/2019, 11:26 AM

## 2019-03-21 NOTE — Progress Notes (Signed)
Patient declined CPAP QHS at this time. Patient resting comfortably in recliner on Room Air at this time. No distress noted. Patient advised to RT if she changes her mind.

## 2019-03-21 NOTE — Care Management Obs Status (Signed)
[facility] NOTIFICATION   Patient Details  Name: Judith Shaw MRN: [id number] Date of Birth: 01/08/1951   Medicare Observation Status Notification Given:  Yes    Anoop Hemmer, Veronia Beets, LCSW 03/21/2019, 9:06 AM

## 2019-03-21 NOTE — Progress Notes (Signed)
Inpatient Diabetes Program Recommendations  AACE/ADA: New Consensus Statement on Inpatient Glycemic Control   Target Ranges:  Prepandial:   less than 140 mg/dL      Peak postprandial:   less than 180 mg/dL (1-2 hours)      Critically ill patients:  140 - 180 mg/dL   Results for KEAJAH, KILLOUGH (MRN [id number]) as of 03/21/2019 10:16  Ref. Range 03/20/2019 08:10 03/20/2019 11:42 03/20/2019 16:48 03/20/2019 18:44 03/20/2019 21:06 03/21/2019 07:43  Glucose-Capillary Latest Ref Range: 70 - 99 mg/dL 378 (H)  Novolog 15 units 446 (H)  Novolog 18 units  Regular (U100) 40 units 330 (H)  Novolog 61 units 318 (H) 189 (H)  Novolog 3 units 344 (H)  Novolog 61 units   Review of Glycemic Control  Diabetes history: DM2 Outpatient Diabetes medications: Humulin R U500 90 units TID with meals Current orders for Inpatient glycemic control: Novolog 50 units BID, Novolog 0-15 units TID with meals and at bedtime  Inpatient Diabetes Program Recommendations:   Insulin - Basal: Please discontinue Novolog 50 units BID and order Humulin R U500 40 units BID. Humulin R U500 is concentrated insulin and it is on formulary in insulin pens.  Thanks, Barnie Alderman, RN, MSN, CDE Diabetes Coordinator Inpatient Diabetes Program [phone] (Team Pager from 8am to 5pm)

## 2019-03-21 NOTE — TOC Initial Note (Signed)
Transition of Care [facility]) - Initial/Assessment Note    Patient Details  Name: Judith Shaw MRN: [id number] Date of Birth: [birth year]  Transition of Care [facility]) CM/SW Contact:    Judith Shaw, Judith Shaw Phone Number: [phone]  03/21/2019, 9:08 AM  Clinical Narrative: Clinical Social Worker (CSW) met with patient to discuss D/C plan. Patient was alert and oriented X4 and was sitting up in the chair at bedside. CSW introduced self and explained role of CSW department. Per patient she lives alone in [place] however she stays with her brother Judith Shaw often to take care of him. Patient reported that her brother has mental health concerns but can preform his ADLs independently. Patient reported that she has a walker at home already and has no needs. CSW will continue to follow and assist as needed.                   Expected Discharge Plan: Home/Self Care Barriers to Discharge: Continued Medical Work up   Patient Goals and CMS Choice Patient states their goals for this hospitalization and ongoing recovery are:: To feel better      Expected Discharge Plan and Services Expected Discharge Plan: Home/Self Care In-house Referral: Clinical Social Work     Living arrangements for the past 2 months: Single Family Home Expected Discharge Date: 03/20/19                                    Prior Living Arrangements/Services Living arrangements for the past 2 months: Single Family Home Lives with:: Self Patient language and need for interpreter reviewed:: No Do you feel safe going back to the place where you live?: Yes      Need for Family Participation in Patient Care: No (Comment) Care giver support system in place?: Yes (comment) Current home services: DME(Patient has a walker at home.) Criminal Activity/Legal Involvement Pertinent to Current Situation/Hospitalization: No - Comment as needed  Activities of Daily Living Home Assistive Devices/Equipment: CBG Meter ADL Screening  (condition at time of admission) Patient's cognitive ability adequate to safely complete daily activities?: Yes Is the patient deaf or have difficulty hearing?: No Does the patient have difficulty seeing, even when wearing glasses/contacts?: No Does the patient have difficulty concentrating, remembering, or making decisions?: No Patient able to express need for assistance with ADLs?: Yes Does the patient have difficulty dressing or bathing?: No Independently performs ADLs?: Yes (appropriate for developmental age) Does the patient have difficulty walking or climbing stairs?: No Weakness of Legs: None Weakness of Arms/Hands: None  Permission Sought/Granted                  Emotional Assessment Appearance:: Appears stated age   Affect (typically observed): Pleasant, Calm Orientation: : Oriented to Self, Oriented to Place, Oriented to  Time, Oriented to Situation Alcohol / Substance Use: Not Applicable Psych Involvement: No (comment)  Admission diagnosis:  Hyperglycemia [R73.9] Memory difficulties [R41.3] Nonspecific chest pain [R07.9] Urinary tract infection without hematuria, site unspecified [N39.0] Patient Active Problem List   Diagnosis Date Noted  . Benign essential HTN 07/19/2018  . Fatty liver 05/15/2018  . Hx of adenomatous colonic polyps 05/15/2018  . Type 2 diabetes mellitus with diabetic nephropathy, with long-term current use of insulin ([place]) 05/01/2018  . Tinnitus, bilateral 04/05/2017  . Concussion syndrome 04/03/2017  . Concussion without loss of consciousness 01/24/2017  . Difficulty walking 01/24/2017  . Headache disorder  01/24/2017  . Numbness and tingling 01/24/2017  . Postural urinary incontinence 01/24/2017  . Sepsis ([place]) 09/21/2016  . UTI (urinary tract infection) 09/21/2016  . HTN (hypertension) 09/21/2016  . Diabetes ([place]) 09/21/2016  . Depression 09/21/2016  . Health care maintenance 10/05/2015  . Recurrent major depressive disorder, in full  remission ([place]) 06/16/2014  . DM (diabetes mellitus) type II controlled, neurological manifestation ([place]) 06/12/2014  . Morbid obesity ([place]) 06/12/2014  . Hyperlipidemia 03/10/2014  . Chronic diastolic CHF (congestive heart failure) ([place]) 03/10/2014  . H/O diastolic dysfunction 19/69/4098  . Sleep apnea 03/10/2014  . SOB (shortness of breath) 03/10/2014   PCP:  Judith Ruths, MD Pharmacy:   [id number] [name], [address] [place] [address] Phone: [phone] Fax: [phone]     Social Determinants of Health (SDOH) Interventions    Readmission Risk Interventions No flowsheet data found.

## 2019-03-22 LAB — BASIC METABOLIC PANEL
Anion gap: 8 (ref 5–15)
BUN: 30 mg/dL — ABNORMAL HIGH (ref 8–23)
CO2: 23 mmol/L (ref 22–32)
Calcium: 8.5 mg/dL — ABNORMAL LOW (ref 8.9–10.3)
Chloride: 106 mmol/L (ref 98–111)
Creatinine, Ser: 0.81 mg/dL (ref 0.44–1.00)
GFR calc Af Amer: >60 mL/min (ref >60)
GFR calc non Af Amer: >60 mL/min (ref >60)
Glucose, Bld: 164 mg/dL — ABNORMAL HIGH (ref 70–99)
Potassium: 3.7 mmol/L (ref 3.5–5.1)
Sodium: 137 mmol/L (ref 135–145)

## 2019-03-22 LAB — URINE CULTURE: Culture: >=100000 — AB

## 2019-03-22 LAB — GLUCOSE, CAPILLARY: Glucose-Capillary: 156 mg/dL — ABNORMAL HIGH (ref 70–99)

## 2019-03-22 MED ORDER — CIPROFLOXACIN HCL 250 MG PO TABS: 250.0000 mg | ORAL_TABLET | Freq: Two times a day (BID) | ORAL | 0 refills | Status: AC

## 2019-03-22 NOTE — Discharge Summary (Signed)
[facility] NAME: Judith Shaw    MR#:  [id number]  DATE OF BIRTH:  1951/03/27  DATE OF ADMISSION:  03/19/2019 ADMITTING PHYSICIAN: Lance Coon, MD  DATE OF DISCHARGE: 03/22/2019 11:44 AM  PRIMARY CARE PHYSICIAN: Kirk Ruths, MD    ADMISSION DIAGNOSIS:  Hyperglycemia [R73.9] Memory difficulties [R41.3] Nonspecific chest pain [R07.9] Urinary tract infection without hematuria, site unspecified [N39.0]  DISCHARGE DIAGNOSIS:  Principal Problem:   UTI (urinary tract infection) Active Problems:   HTN (hypertension)   Hyperlipidemia   Chronic diastolic CHF (congestive heart failure) (HCC)   Sleep apnea   Type 2 diabetes mellitus with diabetic nephropathy, with long-term current use of insulin ([place])   SECONDARY DIAGNOSIS:   Past Medical History:  Diagnosis Date  . Cancer ([place])    pt states hx of uterine cancer and had a complete hyst  . CHF (congestive heart failure) ([place])   . Depression   . Diabetes mellitus without complication ([place])   . Hyperlipidemia   . Hypertension     HOSPITAL COURSE:   68 year old female with past medical history of diabetes, hypertension, hyperlipidemia, history of CHF, obstructive sleep apnea who presented to the hospital due to urinary tract infection.  1.  UTI-patient presented to the hospital due to a UTI based off a urinalysis and had failed outpatient oral antibiotics with Keflex. -Patient was started on IV ceftriaxone while in the hospital.  Patient's urine cultures grew out strep agalactiae and staph epidermidis. - Clinically patient has improved and now being discharged on oral Cipro for additional 3 days.  2.  Diabetes type 2 without complication-patient's blood sugars on admission were significantly uncontrolled. - Patient was placed on some Levemir and sliding scale insulin blood sugars have improved. - Patient is on concentrated U500 insulin, Metformin which she will resume upon  discharge.  3.  Essential hypertension-patient will continue her Coreg, ramipril.  4.  History of CHF-clinically well in the hospital patient was on congestive heart failure. -She will continue Lasix, Coreg, ramipril.  5.  Hyperlipidemia-patient resume her simvastatin.  6.  Depression-patient will continue her Cymbalta.  Patient feels better and is stable for discharge.  DISCHARGE CONDITIONS:   Stable  CONSULTS OBTAINED:    DRUG ALLERGIES:   Allergies  Allergen Reactions  . Codeine     DISCHARGE MEDICATIONS:   Allergies as of 03/22/2019      Reactions   Codeine       Medication List    STOP taking these medications   cephALEXin 500 MG capsule Commonly known as: KEFLEX     TAKE these medications   aspirin EC 81 MG tablet Take 81 mg by mouth daily.   carvedilol 3.125 MG tablet Commonly known as: COREG Take 3.125 mg by mouth 2 (two) times daily with a meal.   ciprofloxacin 250 MG tablet Commonly known as: CIPRO Take 1 tablet (250 mg total) by mouth 2 (two) times daily for 3 days.   clotrimazole-betamethasone cream Commonly known as: LOTRISONE Apply 1 application topically 2 (two) times daily.   DULoxetine 30 MG capsule Commonly known as: CYMBALTA Take 30 mg by mouth daily.   furosemide 20 MG tablet Commonly known as: LASIX Take 20 mg every other day in the morning.   insulin regular human CONCENTRATED 500 UNIT/ML kwikpen Commonly known as: HUMULIN R Inject 90 Units into the skin 3 (three) times daily.   metFORMIN 500 MG 24 hr tablet Commonly known as:  GLUCOPHAGE-XR Take 1,000 mg by mouth 2 (two) times daily.   mirabegron ER 25 MG Tb24 tablet Commonly known as: MYRBETRIQ Take 1 tablet (25 mg total) by mouth daily.   potassium chloride 10 MEQ tablet Commonly known as: K-DUR Take 10 mEq by mouth daily.   ramipril 5 MG capsule Commonly known as: ALTACE Take 5 mg by mouth daily.   simvastatin 40 MG tablet Commonly known as: ZOCOR Take 40  mg by mouth every morning.         DISCHARGE INSTRUCTIONS:   DIET:  Cardiac diet and Diabetic diet  DISCHARGE CONDITION:  Stable  ACTIVITY:  Activity as tolerated  OXYGEN:  Home Oxygen: No.   Oxygen Delivery: room air  DISCHARGE LOCATION:  home   If you experience worsening of your admission symptoms, develop shortness of breath, life threatening emergency, suicidal or homicidal thoughts you must seek medical attention immediately by calling 911 or calling your MD immediately  if symptoms less severe.  You Must read complete instructions/literature along with all the possible adverse reactions/side effects for all the Medicines you take and that have been prescribed to you. Take any new Medicines after you have completely understood and accpet all the possible adverse reactions/side effects.   Please note  You were cared for by a hospitalist during your hospital stay. If you have any questions about your discharge medications or the care you received while you were in the hospital after you are discharged, you can call the unit and asked to speak with the hospitalist on call if the hospitalist that took care of you is not available. Once you are discharged, your primary care physician will handle any further medical issues. Please note that NO REFILLS for any discharge medications will be authorized once you are discharged, as it is imperative that you return to your primary care physician (or establish a relationship with a primary care physician if you do not have one) for your aftercare needs so that they can reassess your need for medications and monitor your lab values.     Today   No acute events overnight, afebrile, hemodynamically stable.  Will discharge home today on oral antibiotics.  VITAL SIGNS:  Blood pressure (!) 102/42, pulse 66, temperature 97.7 F (36.5 C), temperature source Oral, resp. rate 18, height 5\' 4"  (1.626 m), weight 113.4 kg, SpO2 97 %.  I/O:     Intake/Output Summary (Last 24 hours) at 03/22/2019 1456 Last data filed at 03/22/2019 0449 Gross per 24 hour  Intake 345.25 ml  Output -  Net 345.25 ml    PHYSICAL EXAMINATION:  GENERAL:  68 y.o.-year-old obese patient lying in the bed with no acute distress.  EYES: Pupils equal, round, reactive to light and accommodation. No scleral icterus. Extraocular muscles intact.  HEENT: Head atraumatic, normocephalic. Oropharynx and nasopharynx clear.  NECK:  Supple, no jugular venous distention. No thyroid enlargement, no tenderness.  LUNGS: Normal breath sounds bilaterally, no wheezing, rales,rhonchi. No use of accessory muscles of respiration.  CARDIOVASCULAR: S1, S2 normal. No murmurs, rubs, or gallops.  ABDOMEN: Soft, non-tender, non-distended. Bowel sounds present. No organomegaly or mass.  EXTREMITIES: No pedal edema, cyanosis, or clubbing.  NEUROLOGIC: Cranial nerves II through XII are intact. No focal motor or sensory defecits b/l.  PSYCHIATRIC: The patient is alert and oriented x 3.  SKIN: No obvious rash, lesion, or ulcer.   DATA REVIEW:   CBC Recent Labs  Lab 03/21/19 0512  WBC 7.8  HGB 14.9  HCT 44.6  PLT 238    Chemistries  Recent Labs  Lab 03/21/19 0512 03/22/19 0537  NA 134* 137  K 4.2 3.7  CL 101 106  CO2 24 23  GLUCOSE 285* 164*  BUN 30* 30*  CREATININE 0.94 0.81  CALCIUM 8.9 8.5*  MG 1.8  --     Cardiac Enzymes No results for input(s): TROPONINI in the last 168 hours.  Microbiology Results  Results for orders placed or performed during the hospital encounter of 03/19/19  Urine Culture     Status: Abnormal   Collection Time: 03/19/19  6:26 PM   Specimen: Urine, Random  Result Value Ref Range Status   Specimen Description   Final    URINE, RANDOM Performed at [facility], [address] [place], [place] [address]    Special Requests   Final    NONE Performed at [facility] [address], [place], [place] [address]     Culture (A)  Final    >=100,000 COLONIES/mL GROUP B STREP(S.AGALACTIAE)ISOLATED TESTING AGAINST S. AGALACTIAE NOT ROUTINELY PERFORMED DUE TO PREDICTABILITY OF AMP/PEN/VAN SUSCEPTIBILITY. 80,000 COLONIES/mL STAPHYLOCOCCUS EPIDERMIDIS    Report Status 03/22/2019 FINAL  Final   Organism ID, Bacteria STAPHYLOCOCCUS EPIDERMIDIS (A)  Final      Susceptibility   Staphylococcus epidermidis - MIC*    CIPROFLOXACIN <=0.5 SENSITIVE Sensitive     GENTAMICIN 4 SENSITIVE Sensitive     NITROFURANTOIN <=16 SENSITIVE Sensitive     OXACILLIN >=4 RESISTANT Resistant     TETRACYCLINE <=1 SENSITIVE Sensitive     VANCOMYCIN 1 SENSITIVE Sensitive     TRIMETH/SULFA 20 SENSITIVE Sensitive     CLINDAMYCIN <=0.25 SENSITIVE Sensitive     RIFAMPIN <=0.5 SENSITIVE Sensitive     Inducible Clindamycin NEGATIVE Sensitive     * 80,000 COLONIES/mL STAPHYLOCOCCUS EPIDERMIDIS  SARS Coronavirus 2 (CEPHEID - Performed in [facility] hospital lab), Hosp Order     Status: None   Collection Time: 03/19/19  9:32 PM   Specimen: Nasopharyngeal Swab  Result Value Ref Range Status   SARS Coronavirus 2 NEGATIVE NEGATIVE Final    Comment: (NOTE) If result is NEGATIVE SARS-CoV-2 target nucleic acids are NOT DETECTED. The SARS-CoV-2 RNA is generally detectable in upper and lower  respiratory specimens during the acute phase of infection. The lowest  concentration of SARS-CoV-2 viral copies this assay can detect is 250  copies / mL. A negative result does not preclude SARS-CoV-2 infection  and should not be used as the sole basis for treatment or other  patient management decisions.  A negative result may occur with  improper specimen collection / handling, submission of specimen other  than nasopharyngeal swab, presence of viral mutation(s) within the  areas targeted by this assay, and inadequate number of viral copies  (<250 copies / mL). A negative result must be combined with clinical  observations, patient history, and  epidemiological information. If result is POSITIVE SARS-CoV-2 target nucleic acids are DETECTED. The SARS-CoV-2 RNA is generally detectable in upper and lower  respiratory specimens dur ing the acute phase of infection.  Positive  results are indicative of active infection with SARS-CoV-2.  Clinical  correlation with patient history and other diagnostic information is  necessary to determine patient infection status.  Positive results do  not rule out bacterial infection or co-infection with other viruses. If result is PRESUMPTIVE POSTIVE SARS-CoV-2 nucleic acids MAY BE PRESENT.   A presumptive positive result was obtained on the submitted specimen  and confirmed on repeat testing.  While 2019 novel coronavirus  (SARS-CoV-2) nucleic acids may be present in the submitted sample  additional confirmatory testing may be necessary for epidemiological  and / or clinical management purposes  to differentiate between  SARS-CoV-2 and other Sarbecovirus currently known to infect humans.  If clinically indicated additional testing with an alternate test  methodology [phone]) is advised. The SARS-CoV-2 RNA is generally  detectable in upper and lower respiratory sp ecimens during the acute  phase of infection. The expected result is Negative. Fact Sheet for Patients:  StrictlyIdeas.no Fact Sheet for Healthcare Providers: BankingDealers.co.za This test is not yet approved or cleared by the [place] FDA and has been authorized for detection and/or diagnosis of SARS-CoV-2 by FDA under an Emergency Use Authorization (EUA).  This EUA will remain in effect (meaning this test can be used) for the duration of the COVID-19 declaration under Section 564(b)(1) of the Act, 21 U.S.C. section 360bbb-3(b)(1), unless the authorization is terminated or revoked sooner. Performed at [facility], [address] [place], [place] [address]      RADIOLOGY:  No results found.    Management plans discussed with the patient, family and they are in agreement.  CODE STATUS:  Code Status History    Date Active Date Inactive Code Status Order ID Comments User Context   03/20/2019 0033 03/22/2019 1449 Full Code [id number]  Lance Coon, MD Inpatient    TOTAL TIME TAKING CARE OF THIS PATIENT: 40 minutes.    Henreitta Leber M.D on 03/22/2019 at 2:56 PM  Between 7am to 6pm - Pager - [phone]  After 6pm go to [email or web address] - [employer] [facility]  Office  [phone]  CC: Primary care physician; Kirk Ruths, MD

## 2019-03-24 ENCOUNTER — Emergency Department
Admission: EM | Admit: 2019-03-24 | Discharge: 2019-03-24 | Disposition: A | Discharge status: Home / Self Care | Payer: Medicare Other | Attending: Emergency Medicine | Admitting: Emergency Medicine

## 2019-03-24 ENCOUNTER — Other Ambulatory Visit: Payer: Self-pay

## 2019-03-24 ENCOUNTER — Encounter: Payer: Self-pay | Admitting: *Deleted

## 2019-03-24 DIAGNOSIS — F419 Anxiety disorder, unspecified: Secondary | ICD-10-CM | POA: Insufficient documentation

## 2019-03-24 DIAGNOSIS — Z5321 Procedure and treatment not carried out due to patient leaving prior to being seen by health care provider: Secondary | ICD-10-CM | POA: Insufficient documentation

## 2019-03-24 LAB — CBC
HCT: 44.6 % (ref 36.0–46.0)
Hemoglobin: 14.7 g/dL (ref 12.0–15.0)
MCH: 29.8 pg (ref 26.0–34.0)
MCHC: 33 g/dL (ref 30.0–36.0)
MCV: 90.5 fL (ref 80.0–100.0)
Platelets: 270 10*3/uL (ref 150–400)
RBC: 4.93 MIL/uL (ref 3.87–5.11)
RDW: 13 % (ref 11.5–15.5)
WBC: 7.2 10*3/uL (ref 4.0–10.5)
nRBC: 0 % (ref 0.0–0.2)

## 2019-03-24 LAB — BASIC METABOLIC PANEL
Anion gap: 9 (ref 5–15)
BUN: 29 mg/dL — ABNORMAL HIGH (ref 8–23)
CO2: 22 mmol/L (ref 22–32)
Calcium: 8.9 mg/dL (ref 8.9–10.3)
Chloride: 103 mmol/L (ref 98–111)
Creatinine, Ser: 1.04 mg/dL — ABNORMAL HIGH (ref 0.44–1.00)
GFR calc Af Amer: >60 mL/min (ref >60)
GFR calc non Af Amer: 55 mL/min — ABNORMAL LOW (ref >60)
Glucose, Bld: 435 mg/dL — ABNORMAL HIGH (ref 70–99)
Potassium: 4.6 mmol/L (ref 3.5–5.1)
Sodium: 134 mmol/L — ABNORMAL LOW (ref 135–145)

## 2019-03-24 NOTE — ED Triage Notes (Signed)
Pt to ED reporting increased fear and anxiety without a known cause. PT has been seen twice for the same and admitted once for IV antibiotics for a UTI. Pt just started taking Cipro today after filling medication. PT is calm in triage but continues to repeat, "I am so scared" "this is not me" Pt reports she can not think straight and she can not remember things appropriately.

## 2019-03-26 DIAGNOSIS — F410 Panic disorder [episodic paroxysmal anxiety] without agoraphobia: Secondary | ICD-10-CM | POA: Insufficient documentation

## 2019-04-02 ENCOUNTER — Emergency Department
Admission: EM | Admit: 2019-04-02 | Discharge: 2019-04-03 | Disposition: A | Discharge status: Home / Self Care | Payer: Medicare Other | Attending: Emergency Medicine | Admitting: Emergency Medicine

## 2019-04-02 ENCOUNTER — Encounter: Payer: Self-pay | Admitting: Emergency Medicine

## 2019-04-02 ENCOUNTER — Other Ambulatory Visit: Payer: Self-pay

## 2019-04-02 DIAGNOSIS — E1140 Type 2 diabetes mellitus with diabetic neuropathy, unspecified: Secondary | ICD-10-CM | POA: Diagnosis not present

## 2019-04-02 DIAGNOSIS — I110 Hypertensive heart disease with heart failure: Secondary | ICD-10-CM | POA: Insufficient documentation

## 2019-04-02 DIAGNOSIS — Z7982 Long term (current) use of aspirin: Secondary | ICD-10-CM | POA: Diagnosis not present

## 2019-04-02 DIAGNOSIS — Z794 Long term (current) use of insulin: Secondary | ICD-10-CM | POA: Insufficient documentation

## 2019-04-02 DIAGNOSIS — F419 Anxiety disorder, unspecified: Secondary | ICD-10-CM | POA: Diagnosis present

## 2019-04-02 DIAGNOSIS — I5032 Chronic diastolic (congestive) heart failure: Secondary | ICD-10-CM | POA: Diagnosis not present

## 2019-04-02 DIAGNOSIS — Z79899 Other long term (current) drug therapy: Secondary | ICD-10-CM | POA: Insufficient documentation

## 2019-04-02 HISTORY — DX: Anxiety disorder, unspecified: F41.9

## 2019-04-02 MED ORDER — HYDROXYZINE HCL 25 MG PO TABS
25.0000 mg | ORAL_TABLET | Freq: Once | ORAL | Status: AC
  Administered 2019-04-02: 23:00:00 25 mg via ORAL
  Filled 2019-04-02: qty 1

## 2019-04-02 MED ORDER — HYDROXYZINE HCL 10 MG PO TABS: 10.0000 mg | ORAL_TABLET | Freq: Three times a day (TID) | ORAL | 0 refills | Status: AC | PRN

## 2019-04-02 NOTE — ED Notes (Signed)
Pt provide with warm blanket.

## 2019-04-02 NOTE — ED Notes (Signed)
ED Provider Nickolas Madrid at bedside.

## 2019-04-02 NOTE — ED Provider Notes (Signed)
[facility] Emergency Department Provider Note  ____________________________________________   First MD Initiated Contact with Patient 04/02/19 2227     (approximate)  I have reviewed the triage vital signs and the nursing notes.   HISTORY  Chief Complaint Anxiety    HPI Judith Shaw is a 68 y.o. female with anxiety, uterine cancer, CHF, depression, diabetes, hypertension, hyperlipidemia presents here for anxiety.  He states she is feeling anxious at 630 every night.  She says that her meds are not helping.  Reviewed patient meds shows that she is on Cymbalta. They did increase it recently.  She has had memory issues for the past few weeks and had an admission recently for UTI, hyperglycemia.  She was having the memory issues during this admission and they did not get better with rx of UTI.  She denies any urinary symptoms now.  She was told to f/u with neurology but lost the number to f/u with which makes her upset.  She also endorses having increasing confusion about her medications and when she should take them.  She has been compliant with her insulin and has no issues with dosing those. She asks for something to help her relax tonight, neurology number to f/u with, and any resources for her anxiety that has been going on for past few weeks.  Denies any new other medical problems.  No chest pain, sob, fevers.   Anxiety, moderate, occurs at night, nothing makes it better or worse.          Past Medical History:  Diagnosis Date  . Anxiety   . Cancer ([place])    pt states hx of uterine cancer and had a complete hyst  . CHF (congestive heart failure) ([place])   . Depression   . Diabetes mellitus without complication ([place])   . Hyperlipidemia   . Hypertension     Patient Active Problem List   Diagnosis Date Noted  . Benign essential HTN 07/19/2018  . Fatty liver 05/15/2018  . Hx of adenomatous colonic polyps 05/15/2018  . Type 2 diabetes mellitus with  diabetic nephropathy, with long-term current use of insulin ([place]) 05/01/2018  . Tinnitus, bilateral 04/05/2017  . Concussion syndrome 04/03/2017  . Concussion without loss of consciousness 01/24/2017  . Difficulty walking 01/24/2017  . Headache disorder 01/24/2017  . Numbness and tingling 01/24/2017  . Postural urinary incontinence 01/24/2017  . Sepsis ([place]) 09/21/2016  . UTI (urinary tract infection) 09/21/2016  . HTN (hypertension) 09/21/2016  . Diabetes ([place]) 09/21/2016  . Depression 09/21/2016  . Health care maintenance 10/05/2015  . Recurrent major depressive disorder, in full remission ([place]) 06/16/2014  . DM (diabetes mellitus) type II controlled, neurological manifestation ([place]) 06/12/2014  . Morbid obesity ([place]) 06/12/2014  . Hyperlipidemia 03/10/2014  . Chronic diastolic CHF (congestive heart failure) ([place]) 03/10/2014  . H/O diastolic dysfunction 76/81/1572  . Sleep apnea 03/10/2014  . SOB (shortness of breath) 03/10/2014    Past Surgical History:  Procedure Laterality Date  . ABDOMINAL HYSTERECTOMY    . CHOLECYSTECTOMY      Prior to Admission medications   Medication Sig Start Date End Date Taking? Authorizing Provider  aspirin EC 81 MG tablet Take 81 mg by mouth daily.    [provider]  carvedilol (COREG) 3.125 MG tablet Take 3.125 mg by mouth 2 (two) times daily with a meal.    [provider]  clotrimazole-betamethasone (LOTRISONE) cream Apply 1 application topically 2 (two) times daily.    [provider]  DULoxetine (CYMBALTA) 30 MG capsule Take 30 mg by mouth daily.    [provider]  furosemide (LASIX) 20 MG tablet Take 20 mg every other day in the morning. 11/29/16   [provider]  insulin regular human CONCENTRATED (HUMULIN R) 500 UNIT/ML kwikpen Inject 90 Units into the skin 3 (three) times daily. 06/12/18 06/12/19  [provider]  metFORMIN (GLUCOPHAGE-XR) 500 MG 24 hr tablet Take 1,000 mg by mouth 2  (two) times daily.    [provider]  mirabegron ER (MYRBETRIQ) 25 MG TB24 tablet Take 1 tablet (25 mg total) by mouth daily. 05/22/18   Zara Council A, PA-C  potassium chloride (K-DUR,KLOR-CON) 10 MEQ tablet Take 10 mEq by mouth daily.    [provider]  ramipril (ALTACE) 5 MG capsule Take 5 mg by mouth daily.    [provider]  simvastatin (ZOCOR) 40 MG tablet Take 40 mg by mouth every morning.     [provider]    Allergies Codeine  Family History  Problem Relation Age of Onset  . Hypertension Mother   . Heart disease Father   . Prostate cancer Brother   . Diabetes Maternal Aunt   . Kidney cancer Neg Hx   . Bladder Cancer Neg Hx     Social History Social History   Tobacco Use  . Smoking status: Never Smoker  . Smokeless tobacco: Never Used  Substance Use Topics  . Alcohol use: No  . Drug use: No      Review of Systems Constitutional: No fever/chills Eyes: No visual changes. ENT: No sore throat. Cardiovascular: Denies chest pain. Respiratory: Denies shortness of breath. Gastrointestinal: No abdominal pain.  No nausea, no vomiting.  No diarrhea.  No constipation. Genitourinary: Negative for dysuria. Musculoskeletal: Negative for back pain. Skin: Negative for rash. Neurological: Negative for headaches, focal weakness or numbness. All other ROS negative ____________________________________________   PHYSICAL EXAM:  VITAL SIGNS: ED Triage Vitals  Enc Vitals Group     BP 04/02/19 2028 (!) 160/70     Pulse Rate 04/02/19 2028 78     Resp 04/02/19 2028 20     Temp 04/02/19 2028 (!) 97.5 F (36.4 C)     Temp Source 04/02/19 2028 Oral     SpO2 04/02/19 2028 95 %     Weight 04/02/19 2026 252 lb (114.3 kg)     Height 04/02/19 2026 5\' 4"  (1.626 m)     Head Circumference --      Peak Flow --      Pain Score 04/02/19 2026 0     Pain Loc --      Pain Edu? --      Excl. in [place]? --     Constitutional: Alert and oriented.  Well appearing and in no acute distress. Occasionally tearful  Eyes: Conjunctivae are normal. EOMI. Head: Atraumatic. Nose: No congestion/rhinnorhea. Mouth/Throat: Mucous membranes are moist.   Neck: No stridor. Trachea Midline. FROM Cardiovascular: Normal rate, regular rhythm. Grossly normal heart sounds.  Good peripheral circulation. Respiratory: Normal respiratory effort.  No retractions. Lungs CTAB. Gastrointestinal: Soft and nontender. No distention. No abdominal bruits.  Musculoskeletal: No lower extremity tenderness nor edema.  No joint effusions. Neurologic:  Normal speech and language. No gross focal neurologic deficits are appreciated.  Skin:  Skin is warm, dry and intact. No rash noted. Psychiatric: anxious, tearful but denies SI HI AH VH  GU: Deferred   ____________________________________________   LABS (all labs  ordered are listed, but only abnormal results are displayed)  Labs Reviewed - No data to display ____________________________________________   INITIAL IMPRESSION / ASSESSMENT AND PLAN / ED COURSE  Judith Shaw was evaluated in Emergency Department on 04/02/2019 for the symptoms described in the history of present illness. She was evaluated in the context of the global COVID-19 pandemic, which necessitated consideration that the patient might be at risk for infection with the SARS-CoV-2 virus that causes COVID-19. Institutional protocols and algorithms that pertain to the evaluation of patients at risk for COVID-19 are in a state of rapid change based on information released by regulatory bodies including the CDC and federal and state organizations. These policies and algorithms were followed during the patient's care in the ED.    Pt is well appearing 68 yr old with 3 concerns. For the memory issues she had recent CT and is requesting neurology number which I will provide. No signs of acute issues given this has been going on for weeks. Low suspicion for thyroid storm  or myxedema coma.  For the issue with medications I recommend she just take them all in the morning and went over using a pill box to help her.  She denies issues with her insulin or her sugars which is re-assuring.  For the anxiety at night-time. Will trial hydroxyzine.  No SI/HI to warrant IVC psych consult but pt is open to talking to TTS to get some resources to help her.   Will d/c on short prescription of hydroxyzine until she can get outpt psych/therapy set up.  Given neuros number.         ____________________________________________   FINAL CLINICAL IMPRESSION(S) / ED DIAGNOSES   Final diagnoses:  Anxiety      MEDICATIONS GIVEN DURING THIS VISIT:  Medications  hydrOXYzine (ATARAX/VISTARIL) tablet 25 mg (25 mg Oral Given 04/02/19 2320)     ED Discharge Orders         Ordered    hydrOXYzine (ATARAX/VISTARIL) 10 MG tablet  3 times daily PRN     04/02/19 2330           Note:  This document was prepared using Dragon voice recognition software and may include unintentional dictation errors.   Vanessa Beaumont, MD 04/03/19 [phone]

## 2019-04-02 NOTE — Discharge Instructions (Signed)
Your seen today for your anxiety.  You should follow-up with the information that was given to you for your anxiety.  Also given you the neurology number to call them to make an appointment.  It is important to mention that you were seen in the hospital and were recommended to follow-up but have not done so yet.  Return to the ER for any worsening concerns.

## 2019-04-02 NOTE — ED Notes (Signed)
Pt has been sitting calmly and on phone whole time since triage.

## 2019-04-02 NOTE — ED Notes (Signed)
This RN attempted to call TTS w/o success.

## 2019-04-02 NOTE — ED Notes (Signed)
Patient denies pain and is resting comfortably.  

## 2019-04-02 NOTE — ED Notes (Addendum)
Pt st "I forget if I have taken my mediations" for 2 weeks "confuse with my mediations". Pt she has not taken her mediations today. Pt st she bought a pill case orginizer today to help her remember med date/time. Pt st every day at around 1830pm "I get this panic attacks  and get fearful, this has been going on for 2 weeks now. Pt denies triggers for panic daily panic attacks . Pt denies CP/SHOB at this time. Pt A/Ox4 at this time.

## 2019-04-02 NOTE — ED Triage Notes (Signed)
Pt to triage via w/c, agitated and anxious, mask in place; st for several wks has been having anxiety attacks at "630 every night" and her meds are not helping even after being increased wk ago; denies any SI or HI, st "she is just confused about her medications"

## 2019-04-02 NOTE — ED Triage Notes (Signed)
FIRST NURSE NOTE-here for anxiety.  Requested pt multiple times keep mask on.  Seen before for same.

## 2019-04-03 NOTE — BH Assessment (Signed)
Spoke with pt about her anxiety.  The pt denies any major stressors and stated her anxiety attacks happen around 6PM.  She denies SA.  She is open to counseling.  The pt was given resources for various counselors in the area.  The pt denies SI and HI.

## 2019-04-17 ENCOUNTER — Emergency Department: Payer: Medicare Other

## 2019-04-17 ENCOUNTER — Emergency Department
Admission: EM | Admit: 2019-04-17 | Discharge: 2019-04-18 | Disposition: A | Discharge status: Home / Self Care | Payer: Medicare Other | Attending: Emergency Medicine | Admitting: Emergency Medicine

## 2019-04-17 ENCOUNTER — Other Ambulatory Visit: Payer: Self-pay

## 2019-04-17 ENCOUNTER — Encounter: Payer: Self-pay | Admitting: Intensive Care

## 2019-04-17 DIAGNOSIS — I5032 Chronic diastolic (congestive) heart failure: Secondary | ICD-10-CM | POA: Insufficient documentation

## 2019-04-17 DIAGNOSIS — E1165 Type 2 diabetes mellitus with hyperglycemia: Secondary | ICD-10-CM | POA: Diagnosis present

## 2019-04-17 DIAGNOSIS — Z7982 Long term (current) use of aspirin: Secondary | ICD-10-CM | POA: Insufficient documentation

## 2019-04-17 DIAGNOSIS — Z79899 Other long term (current) drug therapy: Secondary | ICD-10-CM | POA: Insufficient documentation

## 2019-04-17 DIAGNOSIS — Z794 Long term (current) use of insulin: Secondary | ICD-10-CM | POA: Diagnosis not present

## 2019-04-17 DIAGNOSIS — F410 Panic disorder [episodic paroxysmal anxiety] without agoraphobia: Secondary | ICD-10-CM | POA: Diagnosis not present

## 2019-04-17 DIAGNOSIS — R413 Other amnesia: Secondary | ICD-10-CM | POA: Insufficient documentation

## 2019-04-17 DIAGNOSIS — R739 Hyperglycemia, unspecified: Secondary | ICD-10-CM

## 2019-04-17 DIAGNOSIS — I110 Hypertensive heart disease with heart failure: Secondary | ICD-10-CM | POA: Insufficient documentation

## 2019-04-17 LAB — URINALYSIS, COMPLETE (UACMP) WITH MICROSCOPIC
Bacteria, UA: NONE SEEN
Bilirubin Urine: NEGATIVE
Glucose, UA: >=500 mg/dL — AB
Hgb urine dipstick: NEGATIVE
Ketones, ur: NEGATIVE mg/dL
Leukocytes,Ua: NEGATIVE
Nitrite: NEGATIVE
Protein, ur: NEGATIVE mg/dL
Specific Gravity, Urine: 1.021 (ref 1.005–1.030)
pH: 5 (ref 5.0–8.0)

## 2019-04-17 LAB — BASIC METABOLIC PANEL
Anion gap: 12 (ref 5–15)
Anion gap: 7 (ref 5–15)
BUN: 33 mg/dL — ABNORMAL HIGH (ref 8–23)
BUN: 26 mg/dL — ABNORMAL HIGH (ref 8–23)
CO2: 20 mmol/L — ABNORMAL LOW (ref 22–32)
CO2: 19 mmol/L — ABNORMAL LOW (ref 22–32)
Calcium: 8.7 mg/dL — ABNORMAL LOW (ref 8.9–10.3)
Calcium: 7.9 mg/dL — ABNORMAL LOW (ref 8.9–10.3)
Chloride: 96 mmol/L — ABNORMAL LOW (ref 98–111)
Chloride: 106 mmol/L (ref 98–111)
Creatinine, Ser: 1.07 mg/dL — ABNORMAL HIGH (ref 0.44–1.00)
Creatinine, Ser: 0.91 mg/dL (ref 0.44–1.00)
GFR calc Af Amer: >60 mL/min (ref >60)
GFR calc Af Amer: >60 mL/min (ref >60)
GFR calc non Af Amer: 53 mL/min — ABNORMAL LOW (ref >60)
GFR calc non Af Amer: >60 mL/min (ref >60)
Glucose, Bld: 550 mg/dL (ref 70–99)
Glucose, Bld: 353 mg/dL — ABNORMAL HIGH (ref 70–99)
Potassium: 4.9 mmol/L (ref 3.5–5.1)
Potassium: 3.9 mmol/L (ref 3.5–5.1)
Sodium: 128 mmol/L — ABNORMAL LOW (ref 135–145)
Sodium: 132 mmol/L — ABNORMAL LOW (ref 135–145)

## 2019-04-17 LAB — CBC
HCT: 44.5 % (ref 36.0–46.0)
Hemoglobin: 14.9 g/dL (ref 12.0–15.0)
MCH: 29.7 pg (ref 26.0–34.0)
MCHC: 33.5 g/dL (ref 30.0–36.0)
MCV: 88.6 fL (ref 80.0–100.0)
Platelets: 271 10*3/uL (ref 150–400)
RBC: 5.02 MIL/uL (ref 3.87–5.11)
RDW: 12.9 % (ref 11.5–15.5)
WBC: 9.3 10*3/uL (ref 4.0–10.5)
nRBC: 0 % (ref 0.0–0.2)

## 2019-04-17 LAB — GLUCOSE, CAPILLARY
Glucose-Capillary: 554 mg/dL (ref 70–99)
Glucose-Capillary: 372 mg/dL — ABNORMAL HIGH (ref 70–99)
Glucose-Capillary: 320 mg/dL — ABNORMAL HIGH (ref 70–99)
Glucose-Capillary: 387 mg/dL — ABNORMAL HIGH (ref 70–99)

## 2019-04-17 LAB — HEPATIC FUNCTION PANEL
ALT: 27 U/L (ref 0–44)
AST: 28 U/L (ref 15–41)
Albumin: 3.1 g/dL — ABNORMAL LOW (ref 3.5–5.0)
Alkaline Phosphatase: 85 U/L (ref 38–126)
Bilirubin, Direct: 0.1 mg/dL (ref 0.0–0.2)
Indirect Bilirubin: 0.5 mg/dL (ref 0.3–0.9)
Total Bilirubin: 0.6 mg/dL (ref 0.3–1.2)
Total Protein: 6.6 g/dL (ref 6.5–8.1)

## 2019-04-17 LAB — TSH: TSH: 1.872 u[IU]/mL (ref 0.350–4.500)

## 2019-04-17 IMAGING — CT CT HEAD WITHOUT CONTRAST
3 series · 15 of 47 positions shown, 18 images · non-contrast
Comparison: [DATE]

CLINICAL DATA: High blood sugars today with some memory issues and
recent anxiety attacks.

EXAM:
CT HEAD WITHOUT CONTRAST
TECHNIQUE: Contiguous axial images were obtained from the base of the skull
through the vertex without intravenous contrast.

[Series 2: head wo · axial · 0.41mm/px · z∈[-113,+12]mm · 9 of 30 slices shown, 12 images]
[im 3/30  brain]
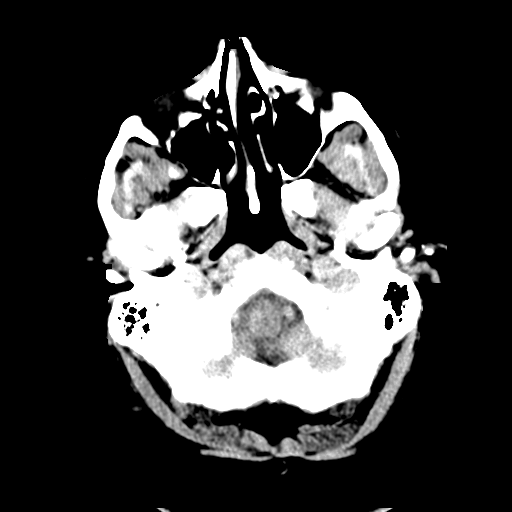
[im 3/30  bone]
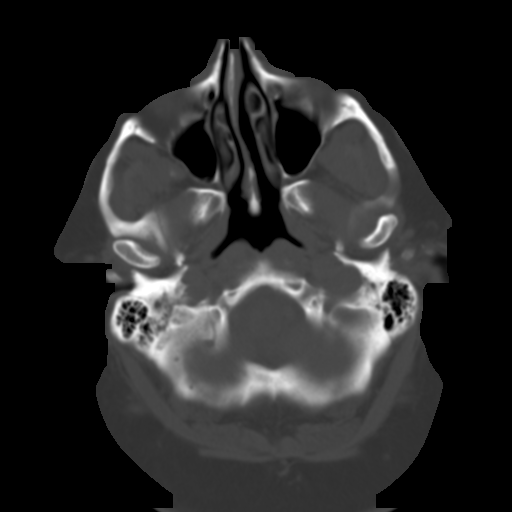
[im 6/30  brain]
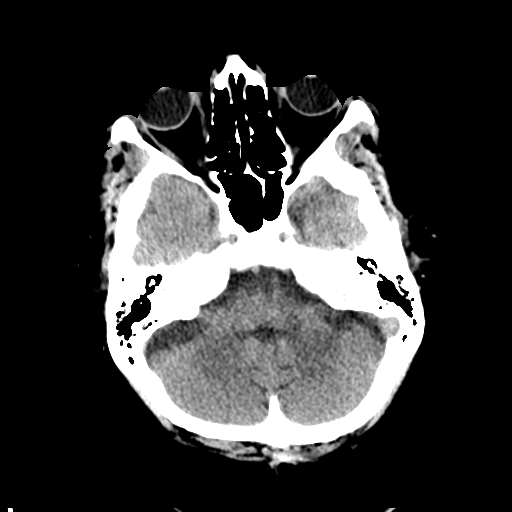
[im 9/30  brain]
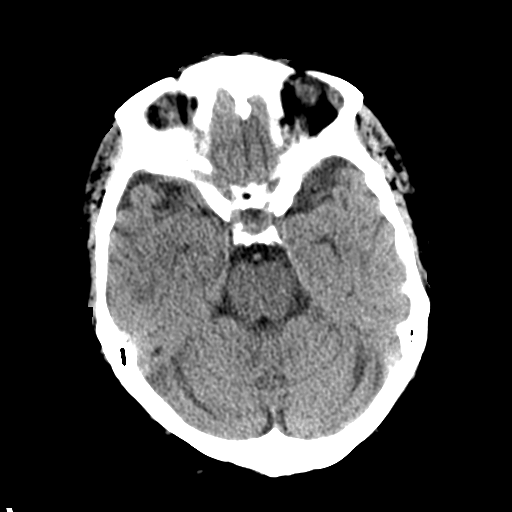
[im 12/30  brain]
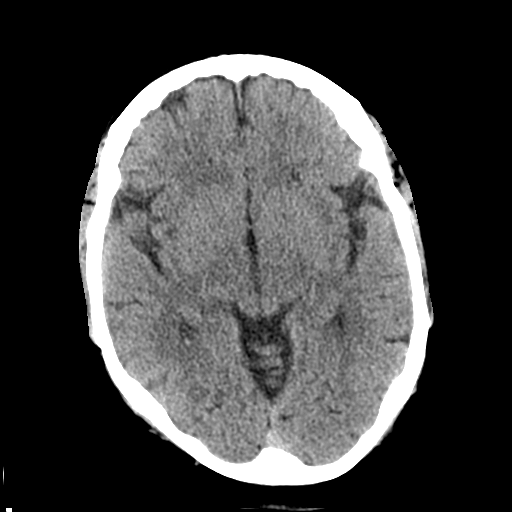
[im 16/30  brain]
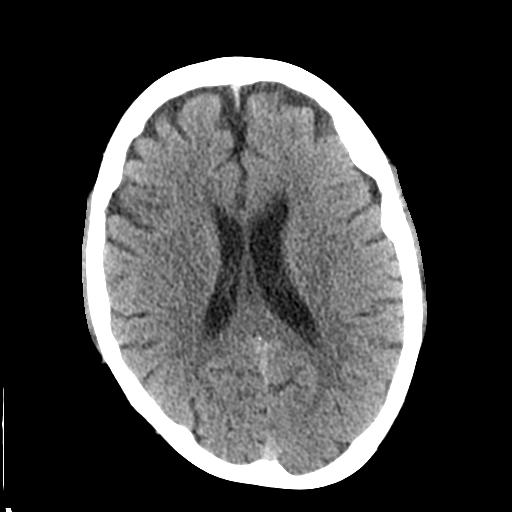
[im 16/30  bone]
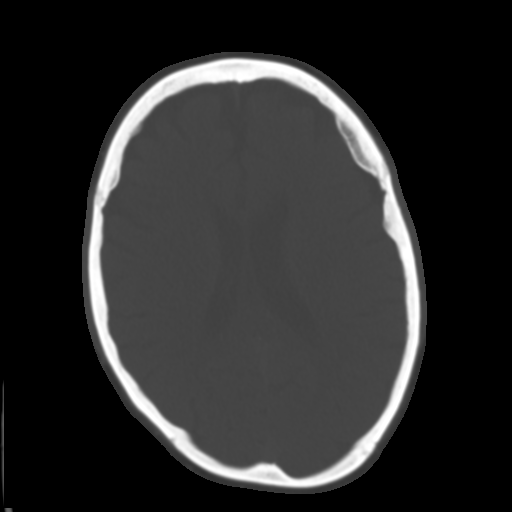
[im 19/30  brain]
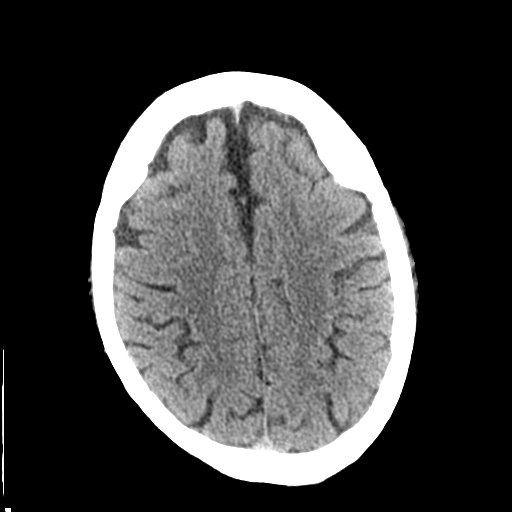
[im 22/30  brain]
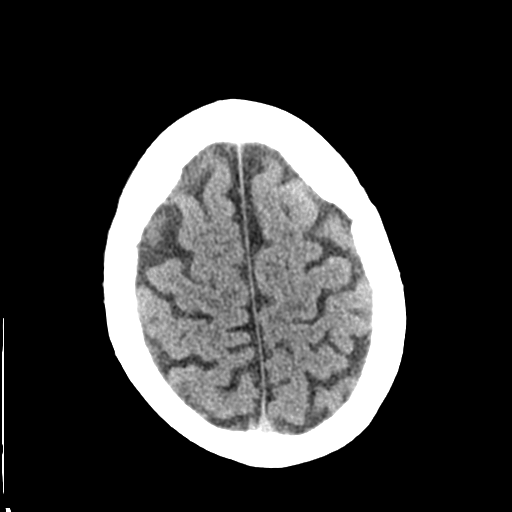
[im 25/30  brain]
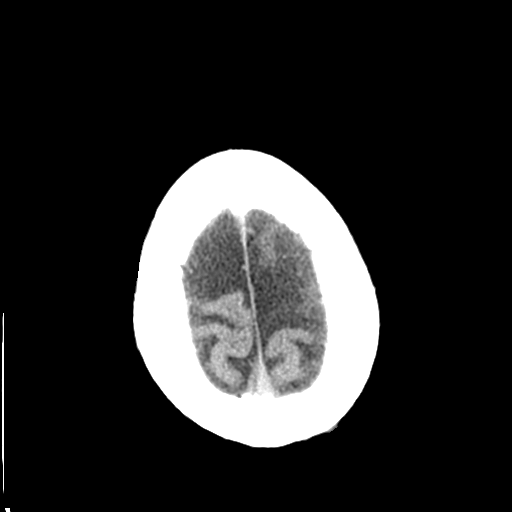
[im 28/30  brain]
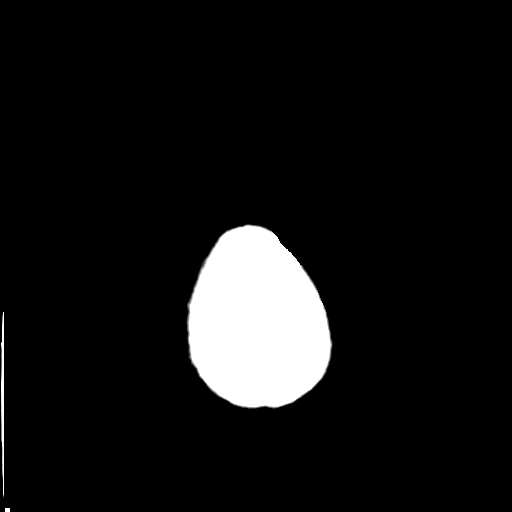
[im 28/30  bone]
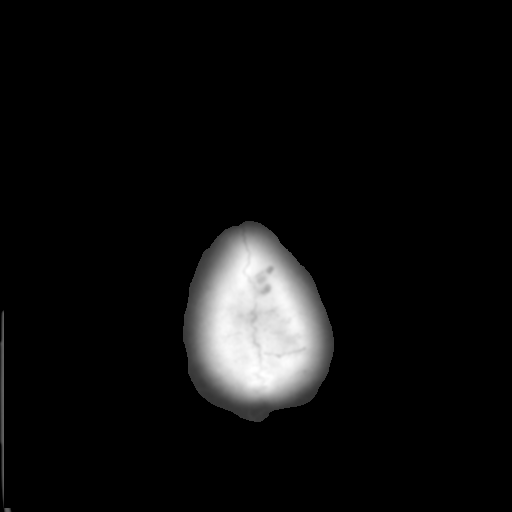

[Series 4: coronal soft tissue · coronal · 0.31mm/px · 3 of 65 slices shown]
[im 22/65  brain]
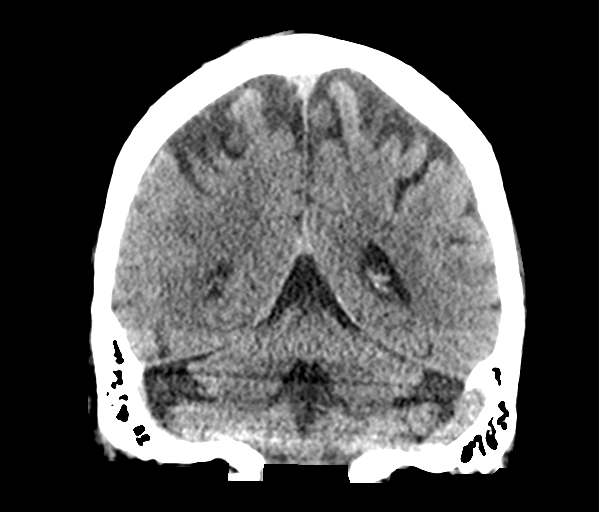
[im 29/65  brain]
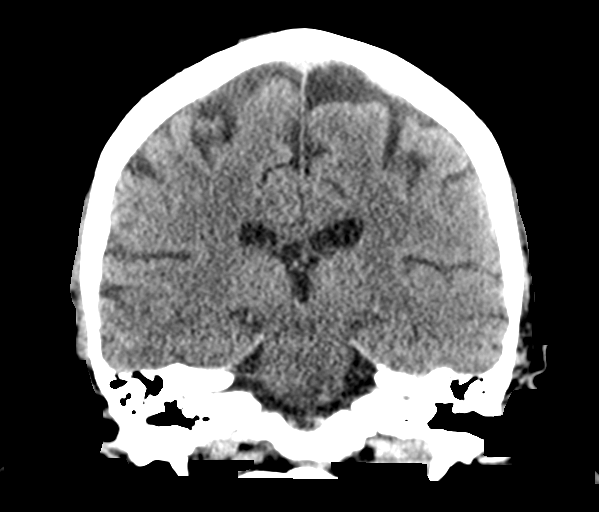
[im 36/65  brain]
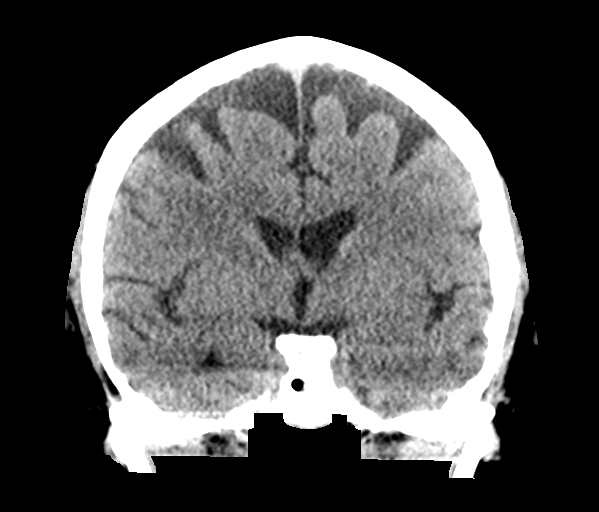

[Series 5: sagittal soft tissue · sagittal · 0.31mm/px · 3 of 54 slices shown]
[im 18/54  brain]
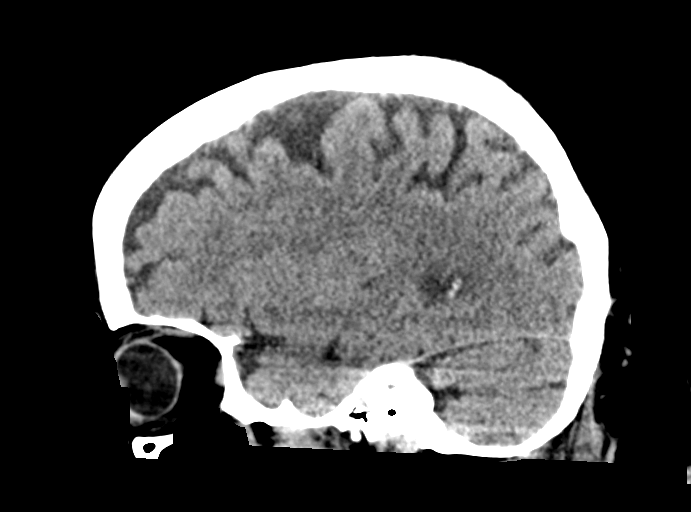
[im 27/54  brain]
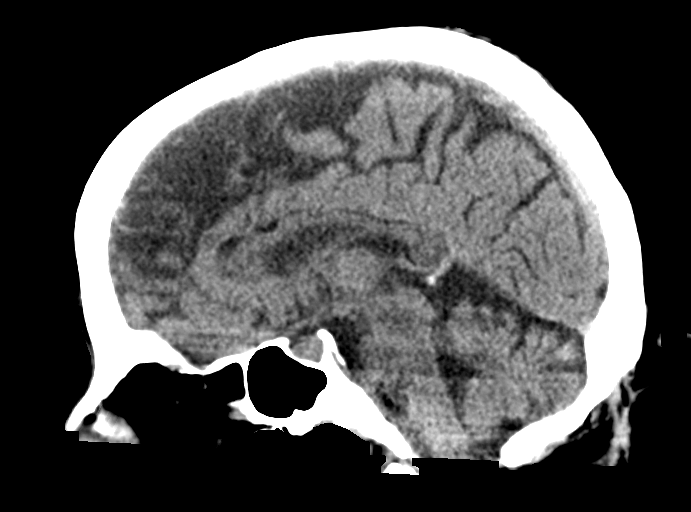
[im 36/54  brain]
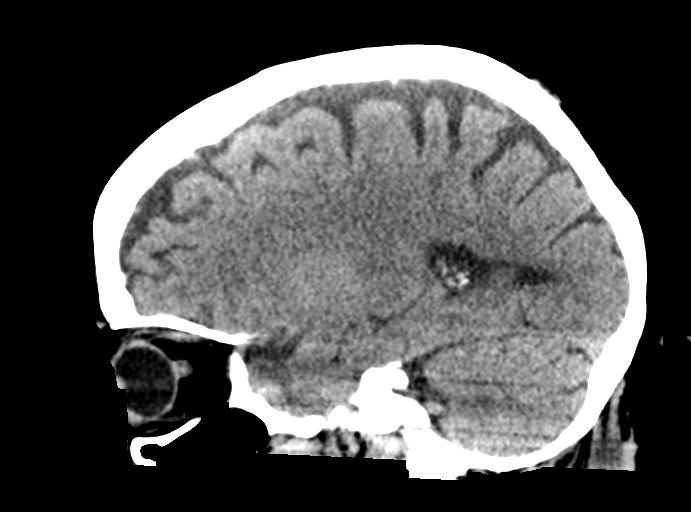

[15 of 47 positions shown; findings below may reference images not displayed]

FINDINGS: Brain: Ventricles and cisterns are normal. There is minimal age
related atrophic change. Minimal chronic ischemic microvascular
disease. No mass, mass effect, shift of midline structures or acute
hemorrhage. No evidence of acute infarction.

Vascular: No hyperdense vessel or unexpected calcification.

Skull: Normal. Negative for fracture or focal lesion.

Sinuses/Orbits: No acute finding.

Other: None.
IMPRESSION: No acute findings.

Minimal chronic ischemic microvascular disease and age related
atrophic change.

## 2019-04-17 IMAGING — CR PORTABLE CHEST - 1 VIEW
1 series · 1 of 1 positions shown · non-contrast
Comparison: [DATE]

CLINICAL DATA: Hyperglycemia with some memory issues and recent
anxiety attacks.

EXAM:
PORTABLE CHEST 1 VIEW

[dg chest port 1 view]
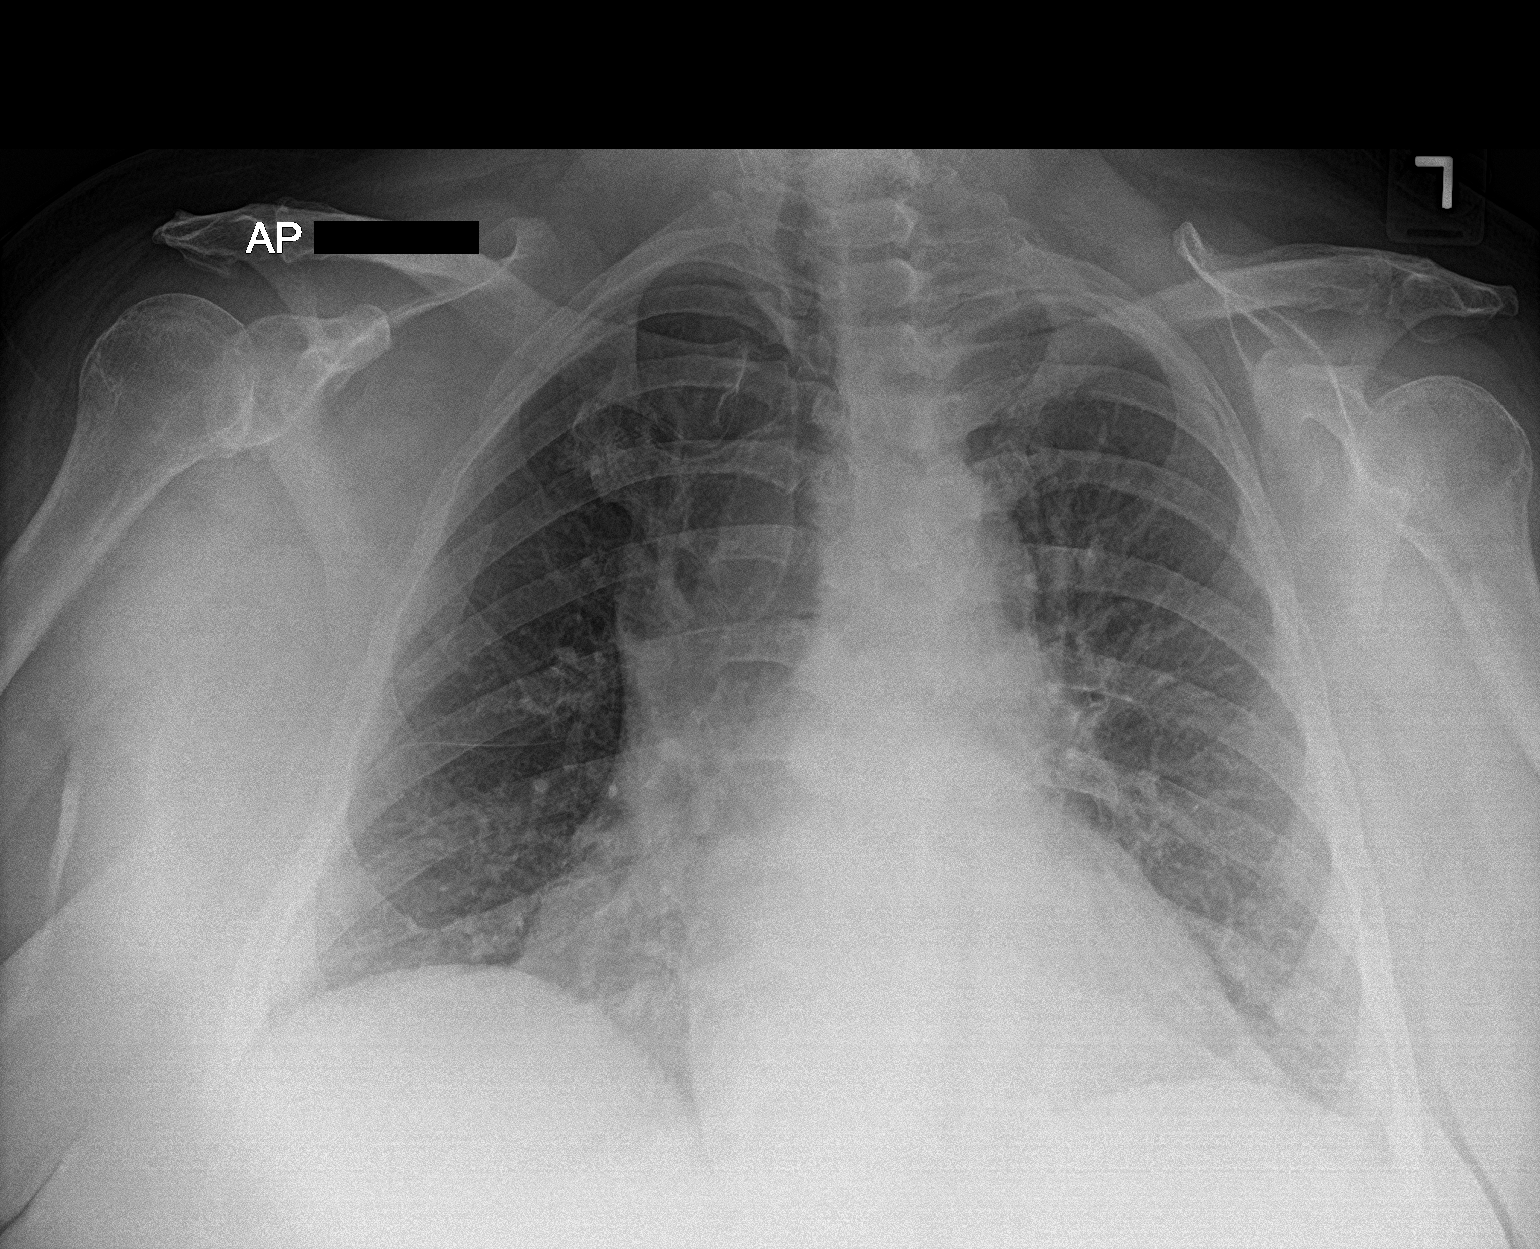

[1 of 1 positions shown; findings below may reference images not displayed]

FINDINGS: Lungs are adequately inflated without focal airspace consolidation
or effusion. Stable cardiomegaly. Remainder of the exam is
unchanged.
IMPRESSION: No acute findings.

Stable cardiomegaly.

## 2019-04-17 MED ORDER — HUMULIN R U-500 KWIKPEN 500 UNIT/ML ~~LOC~~ SOPN: 90.0000 [IU] | PEN_INJECTOR | Freq: Three times a day (TID) | SUBCUTANEOUS | 4 refills | Status: DC

## 2019-04-17 MED ORDER — INSULIN ASPART 100 UNIT/ML ~~LOC~~ SOLN
6.0000 [IU] | Freq: Once | SUBCUTANEOUS | Status: AC
  Administered 2019-04-17: 22:00:00 6 [IU] via INTRAVENOUS
  Filled 2019-04-17: qty 1

## 2019-04-17 MED ORDER — INSULIN ASPART 100 UNIT/ML ~~LOC~~ SOLN
4.0000 [IU] | Freq: Once | SUBCUTANEOUS | Status: AC
  Administered 2019-04-17: 21:00:00 4 [IU] via INTRAVENOUS
  Filled 2019-04-17: qty 1

## 2019-04-17 MED ORDER — SODIUM CHLORIDE 0.9 % IV BOLUS
1000.0000 mL | Freq: Once | INTRAVENOUS | Status: AC
  Administered 2019-04-17: 1000 mL via INTRAVENOUS

## 2019-04-17 MED ORDER — SODIUM CHLORIDE 0.9 % IV BOLUS
1000.0000 mL | Freq: Once | INTRAVENOUS | Status: AC
  Administered 2019-04-17: 19:00:00 1000 mL via INTRAVENOUS

## 2019-04-17 MED ORDER — INSULIN ASPART 100 UNIT/ML ~~LOC~~ SOLN
20.0000 [IU] | Freq: Once | SUBCUTANEOUS | Status: AC
  Administered 2019-04-17: 20 [IU] via INTRAVENOUS
  Filled 2019-04-17: qty 1

## 2019-04-17 NOTE — ED Triage Notes (Signed)
Patient was seen at [facility] clinic today after having high blood sugars at home and sent to ER for hyperglycemia. Pt reports some memory issues. Also c/o anxiety attacks within the last few months and was seen for the same on 04/02/19 with no improvement.

## 2019-04-17 NOTE — ED Provider Notes (Signed)
[facility] Emergency Department Provider Note   ____________________________________________   First MD Initiated Contact with Patient 04/17/19 2003     (approximate)  I have reviewed the triage vital signs and the nursing notes.   HISTORY  Chief Complaint Hyperglycemia and Anxiety    HPI Judith Shaw is a 68 y.o. female who comes in complaining of high blood sugar.  She noticed her blood sugar is high today.  Today was the last day of her multi-day insulin pen and her multi-day sugar reading patch.  Her reading from the patch at home was over 500 so she went to the doctor where it was higher the doctor sent her here.  Here she reports progressive trouble with memory problems forgetting her medications and forgetting where she puts things etc.  She also reports that her blood sugar was high today.  She needs a new prescription for insulin although she is going to see Dr. Frazier Richards tomorrow.  She is also having problems with anxiety attacks.  Usually in the evening she will have an attack where she gets very anxious.  She does not get sweaty or tachycardic or have palpitations or chest tightness she does gets very anxious and often has to go for a ride to calm down.  She is not sure why this is happening.  Additionally she is not having any coughing sneezing headaches dysuria or abdominal pain is reasons for her sugar to go up.         Past Medical History:  Diagnosis Date  . Anxiety   . Cancer ([place])    pt states hx of uterine cancer and had a complete hyst  . CHF (congestive heart failure) ([place])   . Depression   . Diabetes mellitus without complication ([place])   . Hyperlipidemia   . Hypertension     Patient Active Problem List   Diagnosis Date Noted  . Benign essential HTN 07/19/2018  . Fatty liver 05/15/2018  . Hx of adenomatous colonic polyps 05/15/2018  . Type 2 diabetes mellitus with diabetic nephropathy, with long-term current use of  insulin ([place]) 05/01/2018  . Tinnitus, bilateral 04/05/2017  . Concussion syndrome 04/03/2017  . Concussion without loss of consciousness 01/24/2017  . Difficulty walking 01/24/2017  . Headache disorder 01/24/2017  . Numbness and tingling 01/24/2017  . Postural urinary incontinence 01/24/2017  . Sepsis ([place]) 09/21/2016  . UTI (urinary tract infection) 09/21/2016  . HTN (hypertension) 09/21/2016  . Diabetes ([place]) 09/21/2016  . Depression 09/21/2016  . Health care maintenance 10/05/2015  . Recurrent major depressive disorder, in full remission ([place]) 06/16/2014  . DM (diabetes mellitus) type II controlled, neurological manifestation ([place]) 06/12/2014  . Morbid obesity ([place]) 06/12/2014  . Hyperlipidemia 03/10/2014  . Chronic diastolic CHF (congestive heart failure) ([place]) 03/10/2014  . H/O diastolic dysfunction 76/16/0737  . Sleep apnea 03/10/2014  . SOB (shortness of breath) 03/10/2014    Past Surgical History:  Procedure Laterality Date  . ABDOMINAL HYSTERECTOMY    . CHOLECYSTECTOMY      Prior to Admission medications   Medication Sig Start Date End Date Taking? Authorizing Provider  aspirin EC 81 MG tablet Take 81 mg by mouth daily.    [provider]  carvedilol (COREG) 3.125 MG tablet Take 3.125 mg by mouth 2 (two) times daily with a meal.    [provider]  clotrimazole-betamethasone (LOTRISONE) cream Apply 1 application topically 2 (two) times daily.    [provider]  DULoxetine (  CYMBALTA) 30 MG capsule Take 30 mg by mouth daily.    [provider]  furosemide (LASIX) 20 MG tablet Take 20 mg every other day in the morning. 11/29/16   [provider]  hydrOXYzine (ATARAX/VISTARIL) 10 MG tablet Take 1 tablet (10 mg total) by mouth 3 (three) times daily as needed for up to 15 days for anxiety. 04/02/19 04/17/19  Vanessa [place], MD  insulin regular human CONCENTRATED (HUMULIN R) 500 UNIT/ML kwikpen Inject 90 Units into the skin 3 (three)  times daily. 06/12/18 06/12/19  [provider]  metFORMIN (GLUCOPHAGE-XR) 500 MG 24 hr tablet Take 1,000 mg by mouth 2 (two) times daily.    [provider]  mirabegron ER (MYRBETRIQ) 25 MG TB24 tablet Take 1 tablet (25 mg total) by mouth daily. 05/22/18   Zara Council A, PA-C  potassium chloride (K-DUR,KLOR-CON) 10 MEQ tablet Take 10 mEq by mouth daily.    [provider]  ramipril (ALTACE) 5 MG capsule Take 5 mg by mouth daily.    [provider]  simvastatin (ZOCOR) 40 MG tablet Take 40 mg by mouth every morning.     [provider]    Allergies Codeine  Family History  Problem Relation Age of Onset  . Hypertension Mother   . Heart disease Father   . Prostate cancer Brother   . Diabetes Maternal Aunt   . Kidney cancer Neg Hx   . Bladder Cancer Neg Hx     Social History Social History   Tobacco Use  . Smoking status: Never Smoker  . Smokeless tobacco: Never Used  Substance Use Topics  . Alcohol use: No  . Drug use: No    Review of Systems  Constitutional: No fever/chills Eyes: No visual changes. ENT: No sore throat. Cardiovascular: Denies chest pain. Respiratory: Denies shortness of breath. Gastrointestinal: No abdominal pain.  No nausea, no vomiting.  No diarrhea.  No constipation. Genitourinary: Negative for dysuria. Musculoskeletal: Negative for back pain. Skin: Negative for rash. Neurological: Negative for headaches, focal weakness   ____________________________________________   PHYSICAL EXAM:  VITAL SIGNS: ED Triage Vitals  Enc Vitals Group     BP 04/17/19 1618 (!) 125/43     Pulse Rate 04/17/19 1618 80     Resp 04/17/19 1618 16     Temp 04/17/19 1618 98.6 F (37 C)     Temp Source 04/17/19 1618 Oral     SpO2 04/17/19 1618 96 %     Weight 04/17/19 1623 250 lb (113.4 kg)     Height 04/17/19 1623 5' 4.5" (1.638 m)     Head Circumference --      Peak Flow --      Pain Score 04/17/19 1623 0     Pain  Loc --      Pain Edu? --      Excl. in [place]? --     Constitutional: Alert and oriented. Well appearing and in no acute distress. Eyes: Conjunctivae are normal.  Head: Atraumatic. Nose: No congestion/rhinnorhea. Mouth/Throat: Mucous membranes are moist.  Oropharynx non-erythematous. Neck: No stridor.   Cardiovascular: Normal rate, regular rhythm. Grossly normal heart sounds.  Good peripheral circulation. Respiratory: Normal respiratory effort.  No retractions. Lungs CTAB. Gastrointestinal: Soft and nontender. No distention. No abdominal bruits. No CVA tenderness. Musculoskeletal: No lower extremity tenderness Neurologic:  Normal speech and language. No gross focal neurologic deficits are appreciated.  Skin:  Skin is warm, dry and intact. No rash noted.   ____________________________________________  LABS (all labs ordered are listed, but only abnormal results are displayed)  Labs Reviewed  GLUCOSE, CAPILLARY - Abnormal; Notable for the following components:      Result Value   Glucose-Capillary 554 (*)    All other components within normal limits  BASIC METABOLIC PANEL - Abnormal; Notable for the following components:   Sodium 128 (*)    Chloride 96 (*)    CO2 20 (*)    Glucose, Bld 550 (*)    BUN 33 (*)    Creatinine, Ser 1.07 (*)    Calcium 8.7 (*)    GFR calc non Af Amer 53 (*)    All other components within normal limits  URINALYSIS, COMPLETE (UACMP) WITH MICROSCOPIC - Abnormal; Notable for the following components:   Color, Urine STRAW (*)    APPearance CLEAR (*)    Glucose, UA >=500 (*)    All other components within normal limits  GLUCOSE, CAPILLARY - Abnormal; Notable for the following components:   Glucose-Capillary 372 (*)    All other components within normal limits  BASIC METABOLIC PANEL - Abnormal; Notable for the following components:   Sodium 132 (*)    CO2 19 (*)    Glucose, Bld 353 (*)    BUN 26 (*)    Calcium 7.9 (*)    All other components within  normal limits  HEPATIC FUNCTION PANEL - Abnormal; Notable for the following components:   Albumin 3.1 (*)    All other components within normal limits  GLUCOSE, CAPILLARY - Abnormal; Notable for the following components:   Glucose-Capillary 320 (*)    All other components within normal limits  CBC  TSH  VITAMIN B12  FOLATE RBC  CBG MONITORING, ED  CBG MONITORING, ED  CBG MONITORING, ED   ____________________________________________  EKG   ____________________________________________  RADIOLOGY  ED MD interpretation: CT of the head read by radiology shows minimal age-related atrophy and stable cardiomegaly.  I reviewed the films.  Official radiology report(s): Ct Head Wo Contrast  Result Date: 04/17/2019 CLINICAL DATA:  High blood sugars today with some memory issues and recent anxiety attacks. EXAM: CT HEAD WITHOUT CONTRAST TECHNIQUE: Contiguous axial images were obtained from the base of the skull through the vertex without intravenous contrast. COMPARISON:  03/16/2019 FINDINGS: Brain: Ventricles and cisterns are normal. There is minimal age related atrophic change. Minimal chronic ischemic microvascular disease. No mass, mass effect, shift of midline structures or acute hemorrhage. No evidence of acute infarction. Vascular: No hyperdense vessel or unexpected calcification. Skull: Normal. Negative for fracture or focal lesion. Sinuses/Orbits: No acute finding. Other: None. IMPRESSION: No acute findings. Minimal chronic ischemic microvascular disease and age related atrophic change. Electronically Signed   By: Marin Olp M.D.   On: 04/17/2019 20:32   Dg Chest Portable 1 View  Result Date: 04/17/2019 CLINICAL DATA:  Hyperglycemia with some memory issues and recent anxiety attacks. EXAM: PORTABLE CHEST 1 VIEW COMPARISON:  03/19/2019 FINDINGS: Lungs are adequately inflated without focal airspace consolidation or effusion. Stable cardiomegaly. Remainder of the exam is unchanged.  IMPRESSION: No acute findings. Stable cardiomegaly. Electronically Signed   By: Marin Olp M.D.   On: 04/17/2019 20:33    ____________________________________________   PROCEDURES  Procedure(s) performed (including Critical Care):  Procedures   ____________________________________________   INITIAL IMPRESSION / ASSESSMENT AND PLAN / ED COURSE  Patient received 2 L of fluid with resultant decrease of her blood sugar but not below 300 therefore I gave her 4 units  of insulin aspart IV.  This brought the sugar down still further but not below 300 we will give her an additional 6 units of aspart.  At this point and been assigned the patient out to Dr. Archie Balboa.  Patient will have an appoint with Dr. Frazier Richards tomorrow and should be out of follow-up with him.  I suggest that he try to see if she needs her duloxetine dose changed or another medicine added for her panic problem.  I have been unable to get a hold of psych tonight by calling them.  She did not want to wait for too long to see them.  Additionally thyroid level and B12 and folate need to be checked on.             ____________________________________________   FINAL CLINICAL IMPRESSION(S) / ED DIAGNOSES  Final diagnoses:  Hyperglycemia  Panic attack     ED Discharge Orders    None       Note:  This document was prepared using Dragon voice recognition software and may include unintentional dictation errors.    Nena Polio, MD 04/17/19 [phone]

## 2019-04-17 NOTE — ED Notes (Signed)
In to check blood sugar. Pt has a tray of food on the bed. Pt ate a hamburger and carrot sticks and possibly some grapes. Juice also found in the bed. Pt admits to have been grazing on the food. MD notified. Pt wants to be d/c. New verbal order received. MD wants insulin given again and BG repeated in 30 minutes.

## 2019-04-17 NOTE — ED Notes (Addendum)
Assumed care of patient c/o of blood sugar being high. Normal saline started in triage.  Md at bedside to eval.  Labs sent. Will recheck blood glucose upon completion of second liter.

## 2019-04-17 NOTE — ED Notes (Signed)
Per MD order NS bolus started slowly. Rn will continue to monitor patient.

## 2019-04-17 NOTE — Discharge Instructions (Addendum)
Please see Dr. Frazier Richards tomorrow as planned.  Please have him renew your insulin prescription and see if you need your Cymbalta dose changed or another medication added for your panic problem.  Please also have him renew your glucose reading equipment.  Please return here for any further problems.  Just in case I have also renewed your insulin prescription.  I have not renewed your glucose monitoring equipment.

## 2019-04-17 NOTE — ED Notes (Signed)
Blood sent to lab, 3 lav and 1 SST. Insulin given.

## 2019-04-18 LAB — GLUCOSE, CAPILLARY: Glucose-Capillary: 283 mg/dL — ABNORMAL HIGH (ref 70–99)

## 2019-04-18 LAB — VITAMIN B12: Vitamin B-12: 542 pg/mL (ref 180–914)

## 2019-04-21 LAB — FOLATE RBC
Folate, Hemolysate: 464 ng/mL
Folate, RBC: 985 ng/mL (ref >498)
Hematocrit: 47.1 % — ABNORMAL HIGH (ref 34.0–46.6)

## 2019-04-22 DIAGNOSIS — Z794 Long term (current) use of insulin: Secondary | ICD-10-CM | POA: Insufficient documentation

## 2019-04-22 DIAGNOSIS — E1165 Type 2 diabetes mellitus with hyperglycemia: Secondary | ICD-10-CM | POA: Insufficient documentation

## 2019-05-17 ENCOUNTER — Other Ambulatory Visit: Payer: Self-pay

## 2019-05-17 ENCOUNTER — Emergency Department
Admission: EM | Admit: 2019-05-17 | Discharge: 2019-05-18 | Disposition: A | Discharge status: Home / Self Care | Payer: Medicare Other | Attending: Emergency Medicine | Admitting: Emergency Medicine

## 2019-05-17 DIAGNOSIS — Z794 Long term (current) use of insulin: Secondary | ICD-10-CM | POA: Diagnosis not present

## 2019-05-17 DIAGNOSIS — I5032 Chronic diastolic (congestive) heart failure: Secondary | ICD-10-CM | POA: Diagnosis not present

## 2019-05-17 DIAGNOSIS — I110 Hypertensive heart disease with heart failure: Secondary | ICD-10-CM | POA: Diagnosis not present

## 2019-05-17 DIAGNOSIS — N390 Urinary tract infection, site not specified: Secondary | ICD-10-CM | POA: Diagnosis not present

## 2019-05-17 DIAGNOSIS — E119 Type 2 diabetes mellitus without complications: Secondary | ICD-10-CM | POA: Diagnosis not present

## 2019-05-17 DIAGNOSIS — I10 Essential (primary) hypertension: Secondary | ICD-10-CM | POA: Insufficient documentation

## 2019-05-17 DIAGNOSIS — F419 Anxiety disorder, unspecified: Secondary | ICD-10-CM | POA: Diagnosis present

## 2019-05-17 DIAGNOSIS — E1165 Type 2 diabetes mellitus with hyperglycemia: Secondary | ICD-10-CM | POA: Diagnosis not present

## 2019-05-17 DIAGNOSIS — Z8541 Personal history of malignant neoplasm of cervix uteri: Secondary | ICD-10-CM | POA: Insufficient documentation

## 2019-05-17 DIAGNOSIS — R739 Hyperglycemia, unspecified: Secondary | ICD-10-CM

## 2019-05-17 LAB — CBC WITH DIFFERENTIAL/PLATELET
Abs Immature Granulocytes: 0.02 10*3/uL (ref 0.00–0.07)
Basophils Absolute: 0 10*3/uL (ref 0.0–0.1)
Basophils Relative: 1 %
Eosinophils Absolute: 0.2 10*3/uL (ref 0.0–0.5)
Eosinophils Relative: 2 %
HCT: 42.4 % (ref 36.0–46.0)
Hemoglobin: 14.5 g/dL (ref 12.0–15.0)
Immature Granulocytes: 0 %
Lymphocytes Relative: 31 %
Lymphs Abs: 2.5 10*3/uL (ref 0.7–4.0)
MCH: 29.8 pg (ref 26.0–34.0)
MCHC: 34.2 g/dL (ref 30.0–36.0)
MCV: 87.1 fL (ref 80.0–100.0)
Monocytes Absolute: 0.7 10*3/uL (ref 0.1–1.0)
Monocytes Relative: 8 %
Neutro Abs: 4.9 10*3/uL (ref 1.7–7.7)
Neutrophils Relative %: 58 %
Platelets: 293 10*3/uL (ref 150–400)
RBC: 4.87 MIL/uL (ref 3.87–5.11)
RDW: 12.6 % (ref 11.5–15.5)
WBC: 8.3 10*3/uL (ref 4.0–10.5)
nRBC: 0 % (ref 0.0–0.2)

## 2019-05-17 LAB — COMPREHENSIVE METABOLIC PANEL
ALT: 26 U/L (ref 0–44)
AST: 27 U/L (ref 15–41)
Albumin: 3.3 g/dL — ABNORMAL LOW (ref 3.5–5.0)
Alkaline Phosphatase: 151 U/L — ABNORMAL HIGH (ref 38–126)
Anion gap: 10 (ref 5–15)
BUN: 37 mg/dL — ABNORMAL HIGH (ref 8–23)
CO2: 22 mmol/L (ref 22–32)
Calcium: 9.1 mg/dL (ref 8.9–10.3)
Chloride: 99 mmol/L (ref 98–111)
Creatinine, Ser: 1.15 mg/dL — ABNORMAL HIGH (ref 0.44–1.00)
GFR calc Af Amer: 56 mL/min — ABNORMAL LOW (ref >60)
GFR calc non Af Amer: 49 mL/min — ABNORMAL LOW (ref >60)
Glucose, Bld: 433 mg/dL — ABNORMAL HIGH (ref 70–99)
Potassium: 4.4 mmol/L (ref 3.5–5.1)
Sodium: 131 mmol/L — ABNORMAL LOW (ref 135–145)
Total Bilirubin: 0.5 mg/dL (ref 0.3–1.2)
Total Protein: 6.8 g/dL (ref 6.5–8.1)

## 2019-05-17 LAB — GLUCOSE, CAPILLARY: Glucose-Capillary: 419 mg/dL — ABNORMAL HIGH (ref 70–99)

## 2019-05-17 MED ORDER — SODIUM CHLORIDE 0.9 % IV SOLN
1000.0000 mL | Freq: Once | INTRAVENOUS | Status: AC
  Administered 2019-05-17: 1000 mL via INTRAVENOUS

## 2019-05-17 MED ORDER — INSULIN ASPART 100 UNIT/ML ~~LOC~~ SOLN
5.0000 [IU] | Freq: Once | SUBCUTANEOUS | Status: AC
  Administered 2019-05-17: 23:00:00 5 [IU] via SUBCUTANEOUS
  Filled 2019-05-17: qty 1

## 2019-05-17 MED ORDER — LORAZEPAM 2 MG/ML IJ SOLN
1.0000 mg | Freq: Once | INTRAMUSCULAR | Status: AC
  Administered 2019-05-17: 1 mg via INTRAVENOUS
  Filled 2019-05-17: qty 1

## 2019-05-17 NOTE — ED Notes (Signed)
Report given to Coral Ceo RN

## 2019-05-17 NOTE — ED Notes (Signed)
Patient is talking to her sister Abe People on the phone.

## 2019-05-17 NOTE — ED Triage Notes (Signed)
Patient reports feeling anxious, and reports that her blood sugar has been elevated and some confusion.

## 2019-05-17 NOTE — ED Provider Notes (Signed)
[facility] Emergency Department Provider Note       Time seen: ----------------------------------------- 10:23 PM on 05/17/2019 -----------------------------------------   I have reviewed the triage vital signs and the nursing notes.  HISTORY   Chief Complaint Anxiety and Hyperglycemia    HPI Judith Shaw is a 68 y.o. female with a history of anxiety, cancer, CHF, depression, diabetes, hyperlipidemia, hypertension who presents to the ED for feelings of anxiety.  Patient reports her blood sugars been elevated and she has had some confusion.  She states sometimes she forgets to take her medicines as prescribed.  She denies fevers, chills or other complaints.  Past Medical History:  Diagnosis Date  . Anxiety   . Cancer ([place])    pt states hx of uterine cancer and had a complete hyst  . CHF (congestive heart failure) ([place])   . Depression   . Diabetes mellitus without complication ([place])   . Hyperlipidemia   . Hypertension     Patient Active Problem List   Diagnosis Date Noted  . Benign essential HTN 07/19/2018  . Fatty liver 05/15/2018  . Hx of adenomatous colonic polyps 05/15/2018  . Type 2 diabetes mellitus with diabetic nephropathy, with long-term current use of insulin ([place]) 05/01/2018  . Tinnitus, bilateral 04/05/2017  . Concussion syndrome 04/03/2017  . Concussion without loss of consciousness 01/24/2017  . Difficulty walking 01/24/2017  . Headache disorder 01/24/2017  . Numbness and tingling 01/24/2017  . Postural urinary incontinence 01/24/2017  . Sepsis ([place]) 09/21/2016  . UTI (urinary tract infection) 09/21/2016  . HTN (hypertension) 09/21/2016  . Diabetes ([place]) 09/21/2016  . Depression 09/21/2016  . Health care maintenance 10/05/2015  . Recurrent major depressive disorder, in full remission ([place]) 06/16/2014  . DM (diabetes mellitus) type II controlled, neurological manifestation ([place]) 06/12/2014  . Morbid obesity ([place]) 06/12/2014  .  Hyperlipidemia 03/10/2014  . Chronic diastolic CHF (congestive heart failure) ([place]) 03/10/2014  . H/O diastolic dysfunction [id number]  . Sleep apnea 03/10/2014  . SOB (shortness of breath) 03/10/2014    Past Surgical History:  Procedure Laterality Date  . ABDOMINAL HYSTERECTOMY    . CHOLECYSTECTOMY      Allergies Codeine  Social History Social History   Tobacco Use  . Smoking status: Never Smoker  . Smokeless tobacco: Never Used  Substance Use Topics  . Alcohol use: No  . Drug use: No   Review of Systems Constitutional: Negative for fever. Cardiovascular: Negative for chest pain. Respiratory: Negative for shortness of breath. Gastrointestinal: Negative for abdominal pain, vomiting and diarrhea. Musculoskeletal: Negative for back pain. Skin: Negative for rash. Neurological: Negative for headaches, focal weakness or numbness.  Positive for memory disturbance Psychiatric: Positive for anxiety  All systems negative/normal/unremarkable except as stated in the HPI  ____________________________________________   PHYSICAL EXAM:  VITAL SIGNS: ED Triage Vitals  Enc Vitals Group     BP 05/17/19 2212 138/86     Pulse Rate 05/17/19 2212 83     Resp 05/17/19 2212 18     Temp 05/17/19 2212 (!) 97.4 F (36.3 C)     Temp Source 05/17/19 2212 Oral     SpO2 05/17/19 2212 97 %     Weight 05/17/19 2208 250 lb (113.4 kg)     Height 05/17/19 2208 5' 4.5" (1.638 m)     Head Circumference --      Peak Flow --      Pain Score --      Pain Loc --  Pain Edu? --      Excl. in [place]? --    Constitutional: Alert and oriented. Well appearing and in no distress. Eyes: Conjunctivae are normal. Normal extraocular movements. ENT      Head: Normocephalic and atraumatic.      Nose: No congestion/rhinnorhea.      Mouth/Throat: Mucous membranes are moist.      Neck: No stridor. Cardiovascular: Normal rate, regular rhythm. No murmurs, rubs, or gallops. Respiratory: Normal respiratory  effort without tachypnea nor retractions. Breath sounds are clear and equal bilaterally. No wheezes/rales/rhonchi. Gastrointestinal: Soft and nontender. Normal bowel sounds Musculoskeletal: Nontender with normal range of motion in extremities. No lower extremity tenderness nor edema. Neurologic:  Normal speech and language. No gross focal neurologic deficits are appreciated.  Skin:  Skin is warm, dry and intact. No rash noted. Psychiatric: Anxious mood and affect, no distress ____________________________________________  EKG: Interpreted by me.  Sinus rhythm with rate of 79 bpm, normal PR interval, anterior Q waves, normal QT  ____________________________________________  ED COURSE:  As part of my medical decision making, I reviewed the following data within the [place] History obtained from family if available, nursing notes, old chart and ekg, as well as notes from prior ED visits. Patient presented for anxiety and hyperglycemia, we will assess with labs as indicated at this time.   Procedures  Judith Shaw was evaluated in Emergency Department on 05/17/2019 for the symptoms described in the history of present illness. She was evaluated in the context of the global COVID-19 pandemic, which necessitated consideration that the patient might be at risk for infection with the SARS-CoV-2 virus that causes COVID-19. Institutional protocols and algorithms that pertain to the evaluation of patients at risk for COVID-19 are in a state of rapid change based on information released by regulatory bodies including the CDC and federal and state organizations. These policies and algorithms were followed during the patient's care in the ED.  ____________________________________________   LABS (pertinent positives/negatives)  Labs Reviewed  GLUCOSE, CAPILLARY - Abnormal; Notable for the following components:      Result Value   Glucose-Capillary 419 (*)    All other components within  normal limits  COMPREHENSIVE METABOLIC PANEL - Abnormal; Notable for the following components:   Sodium 131 (*)    Glucose, Bld 433 (*)    BUN 37 (*)    Creatinine, Ser 1.15 (*)    Albumin 3.3 (*)    Alkaline Phosphatase 151 (*)    GFR calc non Af Amer 49 (*)    GFR calc Af Amer 56 (*)    All other components within normal limits  CBC WITH DIFFERENTIAL/PLATELET  URINALYSIS, COMPLETE (UACMP) WITH MICROSCOPIC   ___________________________________________   DIFFERENTIAL DIAGNOSIS   Anxiety, depression, dementia, dehydration, electrolyte abnormality, anemia, hyperglycemia, DKA  FINAL ASSESSMENT AND PLAN  Anxiety, hyperglycemia   Plan: The patient had presented for anxiety and hyperglycemia. Patient's labs did reveal some hyperglycemia with a blood sugar in the 400s.  She was given fluids and insulin.  She also received IV Ativan for anxiety.  I do not anticipate admission.Laurence Aly, MD    Note: This note was generated in part or whole with voice recognition software. Voice recognition is usually quite accurate but there are transcription errors that can and very often do occur. I apologize for any typographical errors that were not detected and corrected.     Earleen [place], MD 05/17/19 [phone]

## 2019-05-18 LAB — URINALYSIS, COMPLETE (UACMP) WITH MICROSCOPIC
Bilirubin Urine: NEGATIVE
Glucose, UA: >=500 mg/dL — AB
Ketones, ur: NEGATIVE mg/dL
Nitrite: NEGATIVE
Protein, ur: NEGATIVE mg/dL
Specific Gravity, Urine: 1.016 (ref 1.005–1.030)
WBC, UA: >50 WBC/hpf — ABNORMAL HIGH (ref 0–5)
pH: 6 (ref 5.0–8.0)

## 2019-05-18 LAB — GLUCOSE, CAPILLARY: Glucose-Capillary: 315 mg/dL — ABNORMAL HIGH (ref 70–99)

## 2019-05-18 MED ORDER — CEPHALEXIN 500 MG PO CAPS: 500.0000 mg | ORAL_CAPSULE | Freq: Three times a day (TID) | ORAL | 0 refills | Status: DC

## 2019-05-18 MED ORDER — SODIUM CHLORIDE 0.9 % IV SOLN
1.0000 g | Freq: Once | INTRAVENOUS | Status: AC
  Administered 2019-05-18: 1 g via INTRAVENOUS
  Filled 2019-05-18: qty 10

## 2019-05-18 MED ORDER — NITROFURANTOIN MONOHYD MACRO 100 MG PO CAPS: 100.0000 mg | ORAL_CAPSULE | Freq: Two times a day (BID) | ORAL | 0 refills | Status: AC

## 2019-05-18 NOTE — ED Provider Notes (Signed)
-----------------------------------------   12:31 AM on 05/18/2019 -----------------------------------------  Updated patient on UTI.  Will administer dose of IV Rocephin prior to discharge.  Will discharge home on Keflex.  Strict return precautions given.  Patient verbalizes understanding agrees with plan of care.   ----------------------------------------- 12:50 AM on 05/18/2019 -----------------------------------------  Noted urine culture from July which was sensitive to [place].  Will change Keflex to Macrobid.   Paulette Blanch, MD 05/18/19 [phone]

## 2019-05-18 NOTE — Discharge Instructions (Addendum)
1.  Take antibiotic as prescribed (Macrobid 100mg  twice daily x 7 days). 2.  Return to the ER for worsening symptoms, persistent vomiting, difficulty breathing or other concerns.

## 2019-05-19 LAB — URINE CULTURE: Culture: >=100000 — AB

## 2019-05-21 ENCOUNTER — Ambulatory Visit: Payer: Medicare Other | Admitting: Urology

## 2019-05-21 ENCOUNTER — Encounter: Payer: Self-pay | Admitting: Urology
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
## 2019-06-12 ENCOUNTER — Emergency Department: Payer: Medicare Other

## 2019-06-12 ENCOUNTER — Emergency Department
Admission: EM | Admit: 2019-06-12 | Discharge: 2019-06-12 | Disposition: A | Discharge status: Home / Self Care | Payer: Medicare Other | Attending: Emergency Medicine | Admitting: Emergency Medicine

## 2019-06-12 ENCOUNTER — Other Ambulatory Visit: Payer: Self-pay

## 2019-06-12 ENCOUNTER — Encounter: Payer: Self-pay | Admitting: *Deleted

## 2019-06-12 DIAGNOSIS — E119 Type 2 diabetes mellitus without complications: Secondary | ICD-10-CM | POA: Diagnosis not present

## 2019-06-12 DIAGNOSIS — G44311 Acute post-traumatic headache, intractable: Secondary | ICD-10-CM | POA: Diagnosis not present

## 2019-06-12 DIAGNOSIS — Y999 Unspecified external cause status: Secondary | ICD-10-CM | POA: Diagnosis not present

## 2019-06-12 DIAGNOSIS — Z7982 Long term (current) use of aspirin: Secondary | ICD-10-CM | POA: Insufficient documentation

## 2019-06-12 DIAGNOSIS — Z79899 Other long term (current) drug therapy: Secondary | ICD-10-CM | POA: Insufficient documentation

## 2019-06-12 DIAGNOSIS — Z794 Long term (current) use of insulin: Secondary | ICD-10-CM | POA: Insufficient documentation

## 2019-06-12 DIAGNOSIS — S0990XA Unspecified injury of head, initial encounter: Secondary | ICD-10-CM | POA: Insufficient documentation

## 2019-06-12 DIAGNOSIS — M25512 Pain in left shoulder: Secondary | ICD-10-CM | POA: Insufficient documentation

## 2019-06-12 DIAGNOSIS — Y939 Activity, unspecified: Secondary | ICD-10-CM | POA: Insufficient documentation

## 2019-06-12 DIAGNOSIS — Y92410 Unspecified street and highway as the place of occurrence of the external cause: Secondary | ICD-10-CM | POA: Diagnosis not present

## 2019-06-12 DIAGNOSIS — M542 Cervicalgia: Secondary | ICD-10-CM | POA: Insufficient documentation

## 2019-06-12 DIAGNOSIS — I5032 Chronic diastolic (congestive) heart failure: Secondary | ICD-10-CM | POA: Insufficient documentation

## 2019-06-12 DIAGNOSIS — I110 Hypertensive heart disease with heart failure: Secondary | ICD-10-CM | POA: Diagnosis not present
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
## 2019-06-12 MED ORDER — BACLOFEN 5 MG PO TABS: 5.0000 mg | ORAL_TABLET | Freq: Every day | ORAL | 0 refills | Status: DC

## 2019-06-12 MED ORDER — MELOXICAM 7.5 MG PO TABS: 7.5000 mg | ORAL_TABLET | Freq: Every day | ORAL | 0 refills | Status: DC

## 2019-06-12 MED ORDER — MELOXICAM 7.5 MG PO TABS
7.5000 mg | ORAL_TABLET | Freq: Once | ORAL | Status: AC
  Administered 2019-06-12: 7.5 mg via ORAL
  Filled 2019-06-12: qty 1
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
## 2019-06-12 NOTE — ED Notes (Signed)
Patient transported to CT 
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
## 2019-06-12 NOTE — ED Notes (Addendum)
Noemi Chapel, pt's POA, reports pt has "memory issues"; please call POA for any questions/concerns [phone])

## 2019-06-19 DIAGNOSIS — G43719 Chronic migraine without aura, intractable, without status migrainosus: Secondary | ICD-10-CM | POA: Insufficient documentation

## 2019-07-01 ENCOUNTER — Other Ambulatory Visit: Payer: Self-pay

## 2019-07-01 MED ORDER — MIRABEGRON ER 25 MG PO TB24: 25.0000 mg | ORAL_TABLET | Freq: Every day | ORAL | 0 refills | Status: DC

## 2019-07-15 DIAGNOSIS — R569 Unspecified convulsions: Secondary | ICD-10-CM | POA: Insufficient documentation

## 2019-09-16 ENCOUNTER — Ambulatory Visit: Payer: Medicare Other | Admitting: Speech Pathology

## 2019-09-23 ENCOUNTER — Encounter: Payer: Medicare Other | Admitting: Speech Pathology

## 2019-09-25 ENCOUNTER — Other Ambulatory Visit: Payer: Self-pay

## 2019-09-25 ENCOUNTER — Ambulatory Visit: Payer: Medicare PPO | Attending: Neurology | Admitting: Speech Pathology

## 2019-09-25 DIAGNOSIS — R41841 Cognitive communication deficit: Secondary | ICD-10-CM | POA: Diagnosis present

## 2019-09-26 ENCOUNTER — Other Ambulatory Visit: Payer: Self-pay

## 2019-09-26 ENCOUNTER — Encounter: Payer: Self-pay | Admitting: Speech Pathology

## 2019-09-26 NOTE — Therapy (Signed)
[place] MAIN [facility] [address] [place], [place], [address] Phone: [phone]   Fax:  [phone]  Speech Language Pathology Evaluation  Patient Details  Name: Judith Shaw MRN: [id number] Date of Birth: [birth year] Referring Provider (SLP): Dr. Melrose Nakayama   Encounter Date: 09/25/2019  End of Session - 09/26/19 1535    Visit Number  1    Number of Visits  13    Date for SLP Re-Evaluation  11/07/19    Authorization Type  Medicare    Authorization Time Period  Start 09/25/2019    Authorization - Visit Number  1    Authorization - Number of Visits  10    SLP Start Time  [id number]    SLP Stop Time   1700    SLP Time Calculation (min)  45 min    Activity Tolerance  Patient tolerated treatment well       Past Medical History:  Diagnosis Date  . Anxiety   . Cancer ([place])    pt states hx of uterine cancer and had a complete hyst  . CHF (congestive heart failure) ([place])   . Depression   . Diabetes mellitus without complication ([place])   . Hyperlipidemia   . Hypertension     Past Surgical History:  Procedure Laterality Date  . ABDOMINAL HYSTERECTOMY    . CHOLECYSTECTOMY      There were no vitals filed for this visit.      SLP Evaluation [facility] - 09/26/19 0001      SLP Visit Information   SLP Received On  09/25/19    Referring Provider (SLP)  Dr. Melrose Nakayama    Onset Date  08/28/2019    Medical Diagnosis  Confusion      Subjective   Subjective  "I can't remember anything"    Patient/Family Stated Goal  Improved memory      General Information   HPI  Judith Shaw is a 69 year old woman referred for cognitive therapy secondary confusion.  Head CT, 06/11/2020, shows Atrophy and minimal small vessel ischemic changes of the white matter.  Family and caregiver report significant memory problems.      Prior Functional Status   Cognitive/Linguistic Baseline  Baseline deficits    Baseline deficit details  Unclear how long memory issues have  been present      Cognition   Overall Cognitive Status  Impaired/Different from baseline    Area of Impairment  Memory      Auditory Comprehension   Overall Auditory Comprehension  Appears within functional limits for tasks assessed      Reading Comprehension   Reading Status  Within funtional limits      Expression   Primary Mode of Expression  Verbal      Verbal Expression   Overall Verbal Expression  Appears within functional limits for tasks assessed      Written Expression   Written Expression  Within Functional Limits      Oral Motor/Sensory Function   Overall Oral Motor/Sensory Function  Appears within functional limits for tasks assessed      Motor Speech   Overall Motor Speech  Appears within functional limits for tasks assessed      Standardized Assessments   Standardized Assessments   Montreal Cognitive Assessment (MOCA)       Montreal Cognitive Assessment [facility]) Version: 8.1 Visuospatial/Executive Alternating trail making       1/1 Visuoconstruction Skills (copy 3-d design) 0/1 Draw a clock  3/3 Naming     3/3 Attention Forward digit span    1/1 Backward digit span    1/1 Vigilance     1/1 Serial 7's     3/3 Language  Verbal Fluency     1/1 Repetition     2/2 Abstraction     2/2 Delayed Recall    0/5  Memory Index Score  5/15 Orientation     3/6 TOTAL      21/30       Normal  ? 26/30     SLP Education - 09/26/19 1534    Education Details  Results and recommendations    Person(s) Educated  Patient;Caregiver(s)    Methods  Explanation    Comprehension  Verbalized understanding         SLP Long Term Goals - 09/26/19 1539      SLP LONG TERM GOAL #1   Title  Patient will demonstrate functional cognitive-communication skills for independent completion of personal responsibilities and leisure activities.    Time  6    Period  Weeks    Status  New    Target Date  11/07/19      SLP LONG TERM GOAL #2   Title  Patient will demonstrate  functional use of external memory aids within structured setting.    Time  6    Period  Weeks    Status  New    Target Date  11/07/19      SLP LONG TERM GOAL #3   Title  Patient will complete memory activities with 80% accuracy.    Time  6    Period  Weeks    Status  New    Target Date  11/07/19       Plan - 09/26/19 1538    Clinical Impression Statement  This 69 year old woman, with memory loss, is presenting with mild-moderate cognitive communication impairment characterized by impaired short-term memory and decreased motivation.  The patient scored 21/30 on the [facility] Cognitive Assessment (below the cut-off of 26 for normal).  The patient has relative strengths in language and attention (once engaged).  The patient will benefit from skilled speech therapy for restorative and compensatory treatment of memory impairment.    Speech Therapy Frequency  2x / week    Duration  Other (comment)   6 weeks   Treatment/Interventions  Internal/external aids;Compensatory strategies;Cognitive reorganization    Potential to Achieve Goals  Good    Potential Considerations  Ability to learn/carryover information;Family/community support;Previous level of function;Cooperation/participation level    Consulted and Agree with Plan of Care  Patient       Patient will benefit from skilled therapeutic intervention in order to improve the following deficits and impairments:   Cognitive communication deficit - Plan: SLP plan of care cert/re-cert    Problem List Patient Active Problem List   Diagnosis Date Noted  . Uncontrolled type 2 diabetes mellitus with hyperglycemia, with long-term current use of insulin ([place]) 04/22/2019  . Panic attacks 03/26/2019  . Benign essential HTN 07/19/2018  . Fatty liver 05/15/2018  . Hx of adenomatous colonic polyps 05/15/2018  . Type 2 diabetes mellitus with diabetic nephropathy, with long-term current use of insulin ([place]) 05/01/2018  . Tinnitus, bilateral  04/05/2017  . Concussion syndrome 04/03/2017  . Concussion without loss of consciousness 01/24/2017  . Difficulty walking 01/24/2017  . Headache disorder 01/24/2017  . Numbness and tingling 01/24/2017  . Postural urinary incontinence 01/24/2017  . Sepsis ([place]) 09/21/2016  .  UTI (urinary tract infection) 09/21/2016  . HTN (hypertension) 09/21/2016  . Diabetes ([place]) 09/21/2016  . Depression 09/21/2016  . Health care maintenance 10/05/2015  . Recurrent major depressive disorder, in full remission ([place]) 06/16/2014  . DM (diabetes mellitus) type II controlled, neurological manifestation ([place]) 06/12/2014  . Morbid obesity ([place]) 06/12/2014  . Hyperlipidemia 03/10/2014  . Chronic diastolic CHF (congestive heart failure) ([place]) 03/10/2014  . H/O diastolic dysfunction 123XX123  . Sleep apnea 03/10/2014  . SOB (shortness of breath) 03/10/2014   Leroy Sea, MS/CCC- SLP  Lou Miner 09/26/2019, 3:43 PM  [place] MAIN [facility] [address] [place], [place], [address] Phone: [phone]   Fax:  [phone]  Name: PAIZLEIGH HANAHAN MRN: [id number] Date of Birth: 10/24/1950

## 2019-09-30 ENCOUNTER — Ambulatory Visit: Payer: Medicare PPO | Admitting: Speech Pathology

## 2019-10-02 ENCOUNTER — Ambulatory Visit: Payer: Medicare PPO | Admitting: Speech Pathology

## 2019-10-07 ENCOUNTER — Ambulatory Visit: Payer: Medicare PPO | Admitting: Speech Pathology

## 2019-10-09 ENCOUNTER — Ambulatory Visit: Payer: Medicare PPO | Admitting: Speech Pathology

## 2019-10-14 ENCOUNTER — Ambulatory Visit: Payer: Medicare PPO | Admitting: Speech Pathology

## 2019-10-16 ENCOUNTER — Encounter: Payer: Medicare Other | Admitting: Speech Pathology

## 2019-10-16 ENCOUNTER — Ambulatory Visit: Payer: Medicare PPO | Admitting: Speech Pathology

## 2019-10-21 ENCOUNTER — Encounter: Payer: Medicare Other | Admitting: Speech Pathology

## 2019-10-21 ENCOUNTER — Other Ambulatory Visit: Payer: Self-pay

## 2019-10-21 ENCOUNTER — Ambulatory Visit: Payer: Medicare PPO | Attending: Neurology | Admitting: Speech Pathology

## 2019-10-21 DIAGNOSIS — R41841 Cognitive communication deficit: Secondary | ICD-10-CM | POA: Diagnosis present

## 2019-10-22 ENCOUNTER — Encounter: Payer: Self-pay | Admitting: Speech Pathology

## 2019-10-22 NOTE — Therapy (Signed)
[place] MAIN [facility] SERVICES [address] [place], [place], [address] Phone: [phone]   Fax:  [phone]  Speech Language Pathology Treatment  Patient Details  Name: Judith Shaw MRN: [id number] Date of Birth: 1950/08/29 Referring Provider (SLP): Dr. Melrose Nakayama   Encounter Date: 10/21/2019  End of Session - 10/22/19 0818    Visit Number  2    Number of Visits  13    Date for SLP Re-Evaluation  11/07/19    Authorization Type  Medicare    Authorization Time Period  Start 09/25/2019    Authorization - Visit Number  2    Authorization - Number of Visits  10    SLP Start Time  1600    SLP Stop Time   1650    SLP Time Calculation (min)  50 min    Activity Tolerance  Patient tolerated treatment well       Past Medical History:  Diagnosis Date  . Anxiety   . Cancer ([place])    pt states hx of uterine cancer and had a complete hyst  . CHF (congestive heart failure) ([place])   . Depression   . Diabetes mellitus without complication ([place])   . Hyperlipidemia   . Hypertension     Past Surgical History:  Procedure Laterality Date  . ABDOMINAL HYSTERECTOMY    . CHOLECYSTECTOMY      There were no vitals filed for this visit.  Subjective Assessment - 10/22/19 0817    Subjective  Patient was unaware why she was here/what the appointment was for.    Patient is accompained by:  --   Caregiver   Currently in Pain?  No/denies            ADULT SLP TREATMENT - 10/22/19 0001      General Information   Behavior/Cognition  Alert;Cooperative;Pleasant mood    HPI  Judith Shaw is a 69 year old woman referred for cognitive therapy secondary confusion.  Head CT, 06/11/2020, shows Atrophy and minimal small vessel ischemic changes of the white matter.  Family and caregiver report significant memory problems.       Treatment Provided   Treatment provided  Cognitive-Linquistic      Pain Assessment   Pain Assessment  No/denies pain      Cognitive-Linquistic Treatment   Treatment focused on  Cognition;Patient/family/caregiver education    Skilled Treatment  Patient was educated on strategies for improving cognition. The patient was briefed on external and internal memory aides and asked to recall how she uses schedules/planners/notes in her daily life.      Progression Toward Goals   Progression toward goals  Progressing toward goals       SLP Education - 10/22/19 0817    Education Details  Memory aid strategies    Person(s) Educated  Patient    Methods  Explanation;Demonstration    Comprehension  Verbalized understanding         SLP Long Term Goals - 09/26/19 1539      SLP LONG TERM GOAL #1   Title  Patient will demonstrate functional cognitive-communication skills for independent completion of personal responsibilities and leisure activities.    Time  6    Period  Weeks    Status  New    Target Date  11/07/19      SLP LONG TERM GOAL #2   Title  Patient will demonstrate functional use of external memory aids within structured setting.    Time  6  Period  Weeks    Status  New    Target Date  11/07/19      SLP LONG TERM GOAL #3   Title  Patient will complete memory activities with 80% accuracy.    Time  6    Period  Weeks    Status  New    Target Date  11/07/19       Plan - 10/22/19 0818    Clinical Impression Statement Patient strengths are language and attention. She is able to converse easily and recall various long-term memories. She is eager to engage in conversation. Patient is very socially engaged and reports attending a weekly Bible study and conversing via phone with friends and family daily. Patient often repeats phrases or stories verbatim within minutes. Patient did not know why she was here, and asked multiple times why she was here and what she was doing here. Patient reports significant difficulty with remembering appointments (I.e. can get to a clinic but does not remember what the  appointment is for, who it is with, what time she is supposed to arrive). Patient does not utilize external memory aides and instead relies on friends, family and caregiver to remind her of tasks, appointments, etc.    Speech Therapy Frequency  2x / week    Duration  Other (comment)   6 weeks   Treatment/Interventions  Internal/external aids;Compensatory strategies;Cognitive reorganization    Potential to Achieve Goals  Good    Potential Considerations  Ability to learn/carryover information;Family/community support;Previous level of function;Cooperation/participation level    Consulted and Agree with Plan of Care  Patient       Patient will benefit from skilled therapeutic intervention in order to improve the following deficits and impairments:   Cognitive communication deficit    Problem List Patient Active Problem List   Diagnosis Date Noted  . Uncontrolled type 2 diabetes mellitus with hyperglycemia, with long-term current use of insulin ([place]) 04/22/2019  . Panic attacks 03/26/2019  . Benign essential HTN 07/19/2018  . Fatty liver 05/15/2018  . Hx of adenomatous colonic polyps 05/15/2018  . Type 2 diabetes mellitus with diabetic nephropathy, with long-term current use of insulin ([place]) 05/01/2018  . Tinnitus, bilateral 04/05/2017  . Concussion syndrome 04/03/2017  . Concussion without loss of consciousness 01/24/2017  . Difficulty walking 01/24/2017  . Headache disorder 01/24/2017  . Numbness and tingling 01/24/2017  . Postural urinary incontinence 01/24/2017  . Sepsis ([place]) 09/21/2016  . UTI (urinary tract infection) 09/21/2016  . HTN (hypertension) 09/21/2016  . Diabetes ([place]) 09/21/2016  . Depression 09/21/2016  . Health care maintenance 10/05/2015  . Recurrent major depressive disorder, in full remission ([place]) 06/16/2014  . DM (diabetes mellitus) type II controlled, neurological manifestation ([place]) 06/12/2014  . Morbid obesity ([place]) 06/12/2014  . Hyperlipidemia  03/10/2014  . Chronic diastolic CHF (congestive heart failure) ([place]) 03/10/2014  . H/O diastolic dysfunction 123XX123  . Sleep apnea 03/10/2014  . SOB (shortness of breath) 03/10/2014    Judith Shaw 10/22/2019, 8:19 AM  [place] MAIN [facility] SERVICES [address] [place], [place], [address] Phone: [phone]   Fax:  [phone]   Name: GWENETTA KRIEBEL MRN: [id number] Date of Birth: [birth year]

## 2019-10-23 ENCOUNTER — Ambulatory Visit: Payer: Medicare PPO | Admitting: Speech Pathology

## 2019-10-23 ENCOUNTER — Other Ambulatory Visit: Payer: Self-pay

## 2019-10-23 ENCOUNTER — Encounter: Payer: Medicare Other | Admitting: Speech Pathology

## 2019-10-23 DIAGNOSIS — R41841 Cognitive communication deficit: Secondary | ICD-10-CM

## 2019-10-24 ENCOUNTER — Encounter: Payer: Self-pay | Admitting: Speech Pathology

## 2019-10-24 NOTE — Therapy (Signed)
[place] MAIN [facility] SERVICES [address] [place], [place], [address] Phone: [phone]   Fax:  [phone]  Speech Language Pathology Treatment  Patient Details  Name: Judith Shaw MRN: [id number] Date of Birth: February 28, 1951 Referring Provider (SLP): Dr. Melrose Nakayama   Encounter Date: 10/23/2019  End of Session - 10/24/19 1123    Visit Number  3    Number of Visits  13    Date for SLP Re-Evaluation  11/07/19    Authorization Type  Medicare    Authorization Time Period  Start 09/25/2019    Authorization - Visit Number  3    Authorization - Number of Visits  10    SLP Start Time  0400    SLP Stop Time   0450    SLP Time Calculation (min)  50 min    Activity Tolerance  Patient tolerated treatment well       Past Medical History:  Diagnosis Date  . Anxiety   . Cancer ([place])    pt states hx of uterine cancer and had a complete hyst  . CHF (congestive heart failure) ([place])   . Depression   . Diabetes mellitus without complication ([place])   . Hyperlipidemia   . Hypertension     Past Surgical History:  Procedure Laterality Date  . ABDOMINAL HYSTERECTOMY    . CHOLECYSTECTOMY      There were no vitals filed for this visit.  Subjective Assessment - 10/24/19 1121    Subjective  "Are you the same lady I worked with last time?" Patient also reported a fall out of the bathtub from earlier today.    Patient is accompained by:  --   Caregiver   Currently in Pain?  No/denies            ADULT SLP TREATMENT - 10/24/19 0001      General Information   Behavior/Cognition  Alert;Cooperative;Pleasant mood    HPI  Judith Shaw is a 69 year old woman referred for cognitive therapy secondary confusion.  Head CT, 06/11/2020, shows Atrophy and minimal small vessel ischemic changes of the white matter.  Family and caregiver report significant memory problems.       Treatment Provided   Treatment provided  Cognitive-Linquistic      Pain Assessment   Pain Assessment  No/denies pain      Cognitive-Linquistic Treatment   Treatment focused on  Cognition;Patient/family/caregiver education    Skilled Treatment  Patient was educated on strategies for improving cognition. The patient was briefed on external and internal memory aides. Patient practiced utilizing association strategies in various memory tasks. Patient was able to remember sentences utilizing two different elements with 50% accuracy using delayed recall. She was able to recall 95% of elements given the sentence she created. Patient was also educated on healthy habits to incorporate into her daily life to improve memory.      Progression Toward Goals   Progression toward goals  Progressing toward goals       SLP Education - 10/24/19 1122    Education Details  association techniques    Person(s) Educated  Patient    Methods  Explanation    Comprehension  Verbalized understanding         SLP Long Term Goals - 09/26/19 1539      SLP LONG TERM GOAL #1   Title  Patient will demonstrate functional cognitive-communication skills for independent completion of personal responsibilities and leisure activities.    Time  6  Period  Weeks    Status  New    Target Date  11/07/19      SLP LONG TERM GOAL #2   Title  Patient will demonstrate functional use of external memory aids within structured setting.    Time  6    Period  Weeks    Status  New    Target Date  11/07/19      SLP LONG TERM GOAL #3   Title  Patient will complete memory activities with 80% accuracy.    Time  6    Period  Weeks    Status  New    Target Date  11/07/19       Plan - 10/24/19 1123    Clinical Impression Statement  Patient fell out of the bathtub earlier in the day but could not remember if she was getting in or getting out of the tub. She could explain why she fell. Patient did not report hitting her head. Patient had a difficult time understanding the task, but demonstrated good comprehension  with clinician cues.    Speech Therapy Frequency  2x / week    Duration  Other (comment)   6 weeks   Treatment/Interventions  Internal/external aids;Compensatory strategies;Cognitive reorganization    Potential to Achieve Goals  Good    Potential Considerations  Ability to learn/carryover information;Family/community support;Previous level of function;Cooperation/participation level    Consulted and Agree with Plan of Care  Patient       Patient will benefit from skilled therapeutic intervention in order to improve the following deficits and impairments:   Cognitive communication deficit    Problem List Patient Active Problem List   Diagnosis Date Noted  . Uncontrolled type 2 diabetes mellitus with hyperglycemia, with long-term current use of insulin ([place]) 04/22/2019  . Panic attacks 03/26/2019  . Benign essential HTN 07/19/2018  . Fatty liver 05/15/2018  . Hx of adenomatous colonic polyps 05/15/2018  . Type 2 diabetes mellitus with diabetic nephropathy, with long-term current use of insulin ([place]) 05/01/2018  . Tinnitus, bilateral 04/05/2017  . Concussion syndrome 04/03/2017  . Concussion without loss of consciousness 01/24/2017  . Difficulty walking 01/24/2017  . Headache disorder 01/24/2017  . Numbness and tingling 01/24/2017  . Postural urinary incontinence 01/24/2017  . Sepsis ([place]) 09/21/2016  . UTI (urinary tract infection) 09/21/2016  . HTN (hypertension) 09/21/2016  . Diabetes ([place]) 09/21/2016  . Depression 09/21/2016  . Health care maintenance 10/05/2015  . Recurrent major depressive disorder, in full remission ([place]) 06/16/2014  . DM (diabetes mellitus) type II controlled, neurological manifestation ([place]) 06/12/2014  . Morbid obesity ([place]) 06/12/2014  . Hyperlipidemia 03/10/2014  . Chronic diastolic CHF (congestive heart failure) ([place]) 03/10/2014  . H/O diastolic dysfunction 123XX123  . Sleep apnea 03/10/2014  . SOB (shortness of breath) 03/10/2014    Bobbiejo Ishikawa 10/24/2019, 11:25 AM  [place] MAIN [facility] SERVICES [address] [place], [place], [address] Phone: [phone]   Fax:  [phone]   Name: Judith Shaw MRN: [id number] Date of Birth: 02-Feb-1951

## 2019-10-28 ENCOUNTER — Encounter: Payer: Medicare PPO | Admitting: Speech Pathology

## 2019-10-28 ENCOUNTER — Other Ambulatory Visit: Payer: Self-pay

## 2019-10-28 ENCOUNTER — Ambulatory Visit: Payer: Medicare PPO | Attending: Neurology | Admitting: Speech Pathology

## 2019-10-28 DIAGNOSIS — R41841 Cognitive communication deficit: Secondary | ICD-10-CM

## 2019-10-29 ENCOUNTER — Encounter: Payer: Self-pay | Admitting: Speech Pathology

## 2019-10-29 NOTE — Therapy (Signed)
[place] MAIN [facility] SERVICES [address] [place], [place], [address] Phone: [phone]   Fax:  [phone]  Speech Language Pathology Treatment  Patient Details  Name: Judith Shaw MRN: [id number] Date of Birth: [birth year] Referring Provider (SLP): Dr. Melrose Nakayama   Encounter Date: 10/28/2019  End of Session - 10/29/19 1409    Visit Number  4    Number of Visits  13    Date for SLP Re-Evaluation  11/07/19    Authorization Type  Medicare    Authorization Time Period  Start 09/25/2019    Authorization - Visit Number  4    Authorization - Number of Visits  10    Progress Note Due on Visit  10    SLP Start Time  1600    SLP Stop Time   1645    SLP Time Calculation (min)  45 min    Activity Tolerance  Patient tolerated treatment well       Past Medical History:  Diagnosis Date  . Anxiety   . Cancer ([place])    pt states hx of uterine cancer and had a complete hyst  . CHF (congestive heart failure) ([place])   . Depression   . Diabetes mellitus without complication ([place])   . Hyperlipidemia   . Hypertension     Past Surgical History:  Procedure Laterality Date  . ABDOMINAL HYSTERECTOMY    . CHOLECYSTECTOMY      There were no vitals filed for this visit.  Subjective Assessment - 10/29/19 1408    Subjective  "I recognize this room"            ADULT SLP TREATMENT - 10/29/19 0001      General Information   Behavior/Cognition  Alert;Cooperative;Pleasant mood    HPI  Judith Shaw is a 69 year old woman referred for cognitive therapy secondary confusion.  Head CT, 06/11/2020, shows Atrophy and minimal small vessel ischemic changes of the white matter.  Family and caregiver report significant memory problems.       Treatment Provided   Treatment provided  Cognitive-Linquistic      Pain Assessment   Pain Assessment  No/denies pain      Cognitive-Linquistic Treatment   Treatment focused on  Cognition;Patient/family/caregiver education    Skilled Treatment  The patient completed a variety of cognitively stimulating activities (visual puzzles, word puzzles, and logic & number brain games).  Patient requires least cuing/support for visual puzzles and most for logic/numbers games.  The patient is able to maintain attention without cues but is not able to problem solve when she is stuck.      Assessment / Recommendations / Plan   Plan  Continue with current plan of care      Progression Toward Goals   Progression toward goals  Progressing toward goals       SLP Education - 10/29/19 1408    Education Details  Stimulating activities    Person(s) Educated  Patient    Methods  Explanation    Comprehension  Verbalized understanding         SLP Long Term Goals - 09/26/19 1539      SLP LONG TERM GOAL #1   Title  Patient will demonstrate functional cognitive-communication skills for independent completion of personal responsibilities and leisure activities.    Time  6    Period  Weeks    Status  New    Target Date  11/07/19      SLP LONG TERM  GOAL #2   Title  Patient will demonstrate functional use of external memory aids within structured setting.    Time  6    Period  Weeks    Status  New    Target Date  11/07/19      SLP LONG TERM GOAL #3   Title  Patient will complete memory activities with 80% accuracy.    Time  6    Period  Weeks    Status  New    Target Date  11/07/19       Plan - 10/29/19 1410    Clinical Impression Statement  The patient appears to enjoy stimulating puzzles and games.  She requires assistance for most activities tried today.    Speech Therapy Frequency  2x / week    Duration  Other (comment)    Treatment/Interventions  Internal/external aids;Compensatory strategies;Cognitive reorganization    Potential to Achieve Goals  Good    Potential Considerations  Ability to learn/carryover information;Family/community support;Previous level of function;Cooperation/participation level     Consulted and Agree with Plan of Care  Patient       Patient will benefit from skilled therapeutic intervention in order to improve the following deficits and impairments:   Cognitive communication deficit    Problem List Patient Active Problem List   Diagnosis Date Noted  . Uncontrolled type 2 diabetes mellitus with hyperglycemia, with long-term current use of insulin ([place]) 04/22/2019  . Panic attacks 03/26/2019  . Benign essential HTN 07/19/2018  . Fatty liver 05/15/2018  . Hx of adenomatous colonic polyps 05/15/2018  . Type 2 diabetes mellitus with diabetic nephropathy, with long-term current use of insulin ([place]) 05/01/2018  . Tinnitus, bilateral 04/05/2017  . Concussion syndrome 04/03/2017  . Concussion without loss of consciousness 01/24/2017  . Difficulty walking 01/24/2017  . Headache disorder 01/24/2017  . Numbness and tingling 01/24/2017  . Postural urinary incontinence 01/24/2017  . Sepsis ([place]) 09/21/2016  . UTI (urinary tract infection) 09/21/2016  . HTN (hypertension) 09/21/2016  . Diabetes ([place]) 09/21/2016  . Depression 09/21/2016  . Health care maintenance 10/05/2015  . Recurrent major depressive disorder, in full remission ([place]) 06/16/2014  . DM (diabetes mellitus) type II controlled, neurological manifestation ([place]) 06/12/2014  . Morbid obesity ([place]) 06/12/2014  . Hyperlipidemia 03/10/2014  . Chronic diastolic CHF (congestive heart failure) ([place]) 03/10/2014  . H/O diastolic dysfunction 123XX123  . Sleep apnea 03/10/2014  . SOB (shortness of breath) 03/10/2014   Leroy Sea, MS/CCC- SLP  Lou Miner 10/29/2019, 2:10 PM  [place] MAIN [facility] SERVICES [address] [place], [place], [address] Phone: [phone]   Fax:  [phone]   Name: Judith Shaw MRN: [id number] Date of Birth: 11-05-1950

## 2019-10-30 ENCOUNTER — Ambulatory Visit: Payer: Medicare PPO | Admitting: Speech Pathology

## 2019-10-30 ENCOUNTER — Encounter: Payer: Medicare PPO | Admitting: Speech Pathology

## 2019-10-30 ENCOUNTER — Other Ambulatory Visit (INDEPENDENT_AMBULATORY_CARE_PROVIDER_SITE_OTHER): Payer: Self-pay | Admitting: Podiatry

## 2019-10-30 DIAGNOSIS — I739 Peripheral vascular disease, unspecified: Secondary | ICD-10-CM

## 2019-10-31 ENCOUNTER — Other Ambulatory Visit: Payer: Self-pay

## 2019-10-31 ENCOUNTER — Ambulatory Visit (INDEPENDENT_AMBULATORY_CARE_PROVIDER_SITE_OTHER): Payer: Medicare PPO

## 2019-10-31 DIAGNOSIS — I739 Peripheral vascular disease, unspecified: Secondary | ICD-10-CM | POA: Diagnosis not present

## 2019-11-04 ENCOUNTER — Ambulatory Visit: Payer: Medicare PPO | Admitting: Speech Pathology

## 2019-11-04 ENCOUNTER — Encounter: Payer: Medicare PPO | Admitting: Speech Pathology

## 2019-11-06 ENCOUNTER — Encounter: Payer: Medicare PPO | Admitting: Speech Pathology

## 2019-11-06 ENCOUNTER — Other Ambulatory Visit: Payer: Self-pay

## 2019-11-06 ENCOUNTER — Ambulatory Visit: Payer: Medicare PPO | Admitting: Speech Pathology

## 2019-11-06 DIAGNOSIS — R41841 Cognitive communication deficit: Secondary | ICD-10-CM

## 2019-11-07 ENCOUNTER — Encounter: Payer: Self-pay | Admitting: Speech Pathology

## 2019-11-07 NOTE — Therapy (Signed)
[place] MAIN [facility] SERVICES [address] [place], [place], [address] Phone: [phone]   Fax:  [phone]  Speech Language Pathology Treatment  Patient Details  Name: Judith Shaw MRN: [id number] Date of Birth: 02/03/1951 Referring Provider (SLP): Dr. Melrose Nakayama   Encounter Date: 11/06/2019  End of Session - 11/07/19 1611    Visit Number  5    Number of Visits  13    Date for SLP Re-Evaluation  11/07/19    Authorization Type  Medicare    Authorization Time Period  Start 09/25/2019    Authorization - Visit Number  5    Authorization - Number of Visits  10    SLP Start Time  1600    SLP Stop Time   1650    SLP Time Calculation (min)  50 min    Activity Tolerance  Patient tolerated treatment well       Past Medical History:  Diagnosis Date  . Anxiety   . Cancer ([place])    pt states hx of uterine cancer and had a complete hyst  . CHF (congestive heart failure) ([place])   . Depression   . Diabetes mellitus without complication ([place])   . Hyperlipidemia   . Hypertension     Past Surgical History:  Procedure Laterality Date  . ABDOMINAL HYSTERECTOMY    . CHOLECYSTECTOMY      There were no vitals filed for this visit.  Subjective Assessment - 11/07/19 1611    Subjective  "Am I here for speech and memory?"            ADULT SLP TREATMENT - 11/07/19 0001      General Information   Behavior/Cognition  Alert;Cooperative;Pleasant mood    HPI  Judith Shaw is a 69 year old woman referred for cognitive therapy secondary confusion.  Head CT, 06/11/2020, shows Atrophy and minimal small vessel ischemic changes of the white matter.  Family and caregiver report significant memory problems.       Treatment Provided   Treatment provided  Cognitive-Linquistic      Pain Assessment   Pain Assessment  No/denies pain      Cognitive-Linquistic Treatment   Treatment focused on  Cognition;Patient/family/caregiver education    Skilled Treatment  The  patient completed a variety of cognitively stimulating activities (visual puzzles, word puzzles, and logic & number brain games).  Patient requires least cuing/support for visual puzzles and most for logic/numbers games.  The patient is able to maintain attention without cues but is not able to problem solve when she is stuck.      Assessment / Recommendations / Plan   Plan  Continue with current plan of care      Progression Toward Goals   Progression toward goals  Progressing toward goals       SLP Education - 11/07/19 1611    Education Details  Stimulating activities    Person(s) Educated  Patient    Methods  Explanation    Comprehension  Verbalized understanding         SLP Long Term Goals - 09/26/19 1539      SLP LONG TERM GOAL #1   Title  Patient will demonstrate functional cognitive-communication skills for independent completion of personal responsibilities and leisure activities.    Time  6    Period  Weeks    Status  New    Target Date  11/07/19      SLP LONG TERM GOAL #2   Title  Patient will demonstrate functional use of external memory aids within structured setting.    Time  6    Period  Weeks    Status  New    Target Date  11/07/19      SLP LONG TERM GOAL #3   Title  Patient will complete memory activities with 80% accuracy.    Time  6    Period  Weeks    Status  New    Target Date  11/07/19       Plan - 11/07/19 1612    Clinical Impression Statement  The patient appears to enjoy stimulating puzzles and games.  She requires assistance for most activities tried today, however she perseveres to complete the activities.    Speech Therapy Frequency  2x / week    Duration  Other (comment)    Treatment/Interventions  Internal/external aids;Compensatory strategies;Cognitive reorganization    Potential to Achieve Goals  Good    Potential Considerations  Ability to learn/carryover information;Family/community support;Previous level of  function;Cooperation/participation level    Consulted and Agree with Plan of Care  Patient       Patient will benefit from skilled therapeutic intervention in order to improve the following deficits and impairments:   Cognitive communication deficit    Problem List Patient Active Problem List   Diagnosis Date Noted  . Uncontrolled type 2 diabetes mellitus with hyperglycemia, with long-term current use of insulin ([place]) 04/22/2019  . Panic attacks 03/26/2019  . Benign essential HTN 07/19/2018  . Fatty liver 05/15/2018  . Hx of adenomatous colonic polyps 05/15/2018  . Type 2 diabetes mellitus with diabetic nephropathy, with long-term current use of insulin ([place]) 05/01/2018  . Tinnitus, bilateral 04/05/2017  . Concussion syndrome 04/03/2017  . Concussion without loss of consciousness 01/24/2017  . Difficulty walking 01/24/2017  . Headache disorder 01/24/2017  . Numbness and tingling 01/24/2017  . Postural urinary incontinence 01/24/2017  . Sepsis ([place]) 09/21/2016  . UTI (urinary tract infection) 09/21/2016  . HTN (hypertension) 09/21/2016  . Diabetes ([place]) 09/21/2016  . Depression 09/21/2016  . Health care maintenance 10/05/2015  . Recurrent major depressive disorder, in full remission ([place]) 06/16/2014  . DM (diabetes mellitus) type II controlled, neurological manifestation ([place]) 06/12/2014  . Morbid obesity ([place]) 06/12/2014  . Hyperlipidemia 03/10/2014  . Chronic diastolic CHF (congestive heart failure) ([place]) 03/10/2014  . H/O diastolic dysfunction 123XX123  . Sleep apnea 03/10/2014  . SOB (shortness of breath) 03/10/2014   Leroy Sea, MS/CCC- SLP  Lou Miner 11/07/2019, 4:13 PM  [place] MAIN [facility] SERVICES [address] [place], [place], [address] Phone: [phone]   Fax:  [phone]   Name: Judith Shaw MRN: [id number] Date of Birth: 05-28-1951

## 2019-11-11 ENCOUNTER — Encounter: Payer: Self-pay | Admitting: Speech Pathology

## 2019-11-11 ENCOUNTER — Ambulatory Visit: Payer: Medicare PPO | Admitting: Speech Pathology

## 2019-11-11 ENCOUNTER — Other Ambulatory Visit: Payer: Self-pay

## 2019-11-11 ENCOUNTER — Encounter: Payer: Medicare PPO | Admitting: Speech Pathology

## 2019-11-11 DIAGNOSIS — R41841 Cognitive communication deficit: Secondary | ICD-10-CM

## 2019-11-11 NOTE — Therapy (Addendum)
[place] MAIN [facility] SERVICES [address] [place], [place], [address] Phone: [phone]   Fax:  [phone]  Speech Language Pathology Treatment  Patient Details  Name: Judith Shaw MRN: [id number] Date of Birth: 1951-06-06 Referring Provider (SLP): Dr. Melrose Nakayama   Encounter Date: 11/11/2019  End of Session - 11/11/19 1722    Visit Number  6    Number of Visits  13    Date for SLP Re-Evaluation  11/27/19    Authorization Type  Medicare    Authorization Time Period  Start 09/25/2019    Authorization - Visit Number  6    Authorization - Number of Visits  10    SLP Start Time  1600    SLP Stop Time   1650    SLP Time Calculation (min)  50 min    Activity Tolerance  Patient tolerated treatment well       Past Medical History:  Diagnosis Date  . Anxiety   . Cancer ([place])    pt states hx of uterine cancer and had a complete hyst  . CHF (congestive heart failure) ([place])   . Depression   . Diabetes mellitus without complication ([place])   . Hyperlipidemia   . Hypertension     Past Surgical History:  Procedure Laterality Date  . ABDOMINAL HYSTERECTOMY    . CHOLECYSTECTOMY      There were no vitals filed for this visit.  Subjective Assessment - 11/11/19 1721    Subjective  Patient complained of being very tired.    Currently in Pain?  No/denies            ADULT SLP TREATMENT - 11/11/19 0001      General Information   Behavior/Cognition  Alert;Cooperative;Pleasant mood    HPI  Judith Shaw is a 69 year old woman referred for cognitive therapy secondary confusion.  Head CT, 06/11/2020, shows Atrophy and minimal small vessel ischemic changes of the white matter.  Family and caregiver report significant memory problems.       Treatment Provided   Treatment provided  Cognitive-Linquistic      Pain Assessment   Pain Assessment  No/denies pain      Cognitive-Linquistic Treatment   Treatment focused on   Cognition;Patient/family/caregiver education    Skilled Treatment  Patient was educated on visualization strategy. She required multiple reminders of what she was doing on the task. She was 80% accurate with immediate recall and 50% on delayed recall (f=10). She was able to choose the correct answer given a choice of 2. Patient struggles with abstract concepts and is unable to remember unlikely scenarios or invented scenarios. She is able to visualize things and remember things if she can relate it back to her daily life.      Assessment / Recommendations / Plan   Plan  Continue with current plan of care      Progression Toward Goals   Progression toward goals  Progressing toward goals       SLP Education - 11/11/19 1722    Education Details  visualization technique    Person(s) Educated  Patient    Methods  Explanation    Comprehension  Verbalized understanding         SLP Long Term Goals - 11/12/19 1326      SLP LONG TERM GOAL #1   Title  Patient will demonstrate functional cognitive-communication skills for independent completion of personal responsibilities and leisure activities.    Status  Partially Met  Target Date  11/27/19      SLP LONG TERM GOAL #2   Title  Patient will demonstrate functional use of external memory aids within structured setting.    Status  Partially Met    Target Date  11/27/19      SLP LONG TERM GOAL #3   Title  Patient will complete memory activities with 80% accuracy.    Status  Partially Met    Target Date  11/27/19       Plan - 11/11/19 1722    Clinical Impression Statement  The patient has excellent language and pragmatic skills, however, she presents with significant deficits in short-term memory. She requires assistance for most activities, however, she perserveres to complete the activities.    Speech Therapy Frequency  2x / week    Duration  Other (comment)    Treatment/Interventions  Internal/external aids;Compensatory  strategies;Cognitive reorganization    Potential to Achieve Goals  Good    Potential Considerations  Ability to learn/carryover information;Family/community support;Previous level of function;Cooperation/participation level    Consulted and Agree with Plan of Care  Patient       Patient will benefit from skilled therapeutic intervention in order to improve the following deficits and impairments:   Cognitive communication deficit - Plan: SLP plan of care cert/re-cert    Problem List Patient Active Problem List   Diagnosis Date Noted  . Uncontrolled type 2 diabetes mellitus with hyperglycemia, with long-term current use of insulin ([place]) 04/22/2019  . Panic attacks 03/26/2019  . Benign essential HTN 07/19/2018  . Fatty liver 05/15/2018  . Hx of adenomatous colonic polyps 05/15/2018  . Type 2 diabetes mellitus with diabetic nephropathy, with long-term current use of insulin ([place]) 05/01/2018  . Tinnitus, bilateral 04/05/2017  . Concussion syndrome 04/03/2017  . Concussion without loss of consciousness 01/24/2017  . Difficulty walking 01/24/2017  . Headache disorder 01/24/2017  . Numbness and tingling 01/24/2017  . Postural urinary incontinence 01/24/2017  . Sepsis ([place]) 09/21/2016  . UTI (urinary tract infection) 09/21/2016  . HTN (hypertension) 09/21/2016  . Diabetes ([place]) 09/21/2016  . Depression 09/21/2016  . Health care maintenance 10/05/2015  . Recurrent major depressive disorder, in full remission ([place]) 06/16/2014  . DM (diabetes mellitus) type II controlled, neurological manifestation ([place]) 06/12/2014  . Morbid obesity ([place]) 06/12/2014  . Hyperlipidemia 03/10/2014  . Chronic diastolic CHF (congestive heart failure) ([place]) 03/10/2014  . H/O diastolic dysfunction 83/77/9396  . Sleep apnea 03/10/2014  . SOB (shortness of breath) 03/10/2014    Judith Shaw 11/12/2019, 1:28 PM  [place] MAIN [facility] SERVICES [address]  [place], [place], [address] Phone: [phone]   Fax:  [phone]   Name: Judith Shaw MRN: [id number] Date of Birth: [birth year]

## 2019-11-12 NOTE — Addendum Note (Signed)
Addended by: Robb Matar on: 11/12/2019 01:29 PM   Modules accepted: Orders

## 2019-11-13 ENCOUNTER — Ambulatory Visit: Payer: Medicare PPO | Admitting: Speech Pathology

## 2019-11-13 ENCOUNTER — Encounter: Payer: Medicare PPO | Admitting: Speech Pathology

## 2019-11-18 ENCOUNTER — Ambulatory Visit: Payer: Medicare PPO | Admitting: Speech Pathology

## 2019-11-18 ENCOUNTER — Encounter: Payer: Medicare PPO | Admitting: Speech Pathology

## 2019-11-18 DIAGNOSIS — E11621 Type 2 diabetes mellitus with foot ulcer: Secondary | ICD-10-CM | POA: Insufficient documentation

## 2019-11-18 DIAGNOSIS — L97429 Non-pressure chronic ulcer of left heel and midfoot with unspecified severity: Secondary | ICD-10-CM | POA: Insufficient documentation

## 2019-11-20 ENCOUNTER — Other Ambulatory Visit: Payer: Self-pay

## 2019-11-20 ENCOUNTER — Encounter: Payer: Medicare PPO | Admitting: Speech Pathology

## 2019-11-20 ENCOUNTER — Encounter: Payer: Self-pay | Admitting: Speech Pathology

## 2019-11-20 ENCOUNTER — Ambulatory Visit: Payer: Medicare PPO | Admitting: Speech Pathology

## 2019-11-20 DIAGNOSIS — R413 Other amnesia: Secondary | ICD-10-CM | POA: Insufficient documentation

## 2019-11-20 DIAGNOSIS — R41841 Cognitive communication deficit: Secondary | ICD-10-CM | POA: Diagnosis not present

## 2019-11-20 NOTE — Therapy (Signed)
[facility] [facility] SERVICES [address] [place], [place], [address] Phone: [phone]   Fax:  [phone]  Speech Language Pathology Treatment  Patient Details  Name: Judith Shaw MRN: [id number] Date of Birth: 05/17/1951 Referring Provider (SLP): Dr. Potter   Encounter Date: 11/20/2019  End of Session - 11/20/19 1735    Visit Number  7    Number of Visits  13    Date for SLP Re-Evaluation  11/27/19    Authorization Type  Medicare    Authorization Time Period  Start 09/25/2019    Authorization - Visit Number  7    Authorization - Number of Visits  10    SLP Start Time  1600    SLP Stop Time   1650    SLP Time Calculation (min)  50 min    Activity Tolerance  Patient tolerated treatment well       Past Medical History:  Diagnosis Date  . Anxiety   . Cancer (HCC)    pt states hx of uterine cancer and had a complete hyst  . CHF (congestive heart failure) (HCC)   . Depression   . Diabetes mellitus without complication (HCC)   . Hyperlipidemia   . Hypertension     Past Surgical History:  Procedure Laterality Date  . ABDOMINAL HYSTERECTOMY    . CHOLECYSTECTOMY      There were no vitals filed for this visit.  Subjective Assessment - 11/20/19 1734    Subjective  Patient with unusually flat affect            ADULT SLP TREATMENT - 11/20/19 0001      General Information   Behavior/Cognition  Alert;Cooperative;Pleasant mood    HPI  Judith Shaw is a 69-year-old woman referred for cognitive therapy secondary confusion.  Head CT, 06/11/2020, shows Atrophy and minimal small vessel ischemic changes of the white matter.  Family and caregiver report significant memory problems.       Treatment Provided   Treatment provided  Cognitive-Linquistic      Pain Assessment   Pain Assessment  No/denies pain      Cognitive-Linquistic Treatment   Treatment focused on  Cognition;Patient/family/caregiver education    Skilled Treatment   Patient was educated on rhyming strategy to aid with memory. She required multiple reminders of what she was doing on the task. She was 50% accurate with immediate recall. She benefitted from spaced retreival and repetition. Patient struggles with abstract concepts and is unable to remember unlikely scenarios or invented/hypothetical scenarios. The patient was 70% accurate in remembering details after 3 repetitions of rhyming phrases. She was 90% accurate in remembering details given mod clinician cues. Patient reported benefitting from repetition and visualization strategies. The rhyming strategy was too complex per patient report (i.e. she could not remember the lines of the rhyme).      Assessment / Recommendations / Plan   Plan  Continue with current plan of care       SLP Education - 11/20/19 1735    Education Details  visualization, repetition, rhyming    Person(s) Educated  Patient    Methods  Explanation    Comprehension  Verbalized understanding         SLP Long Term Goals - 11/12/19 1326      SLP LONG TERM GOAL #1   Title  Patient will demonstrate functional cognitive-communication skills for independent completion of personal responsibilities and leisure activities.    Status  Partially Met      Target Date  11/27/19      SLP LONG TERM GOAL #2   Title  Patient will demonstrate functional use of external memory aids within structured setting.    Status  Partially Met    Target Date  11/27/19      SLP LONG TERM GOAL #3   Title  Patient will complete memory activities with 80% accuracy.    Status  Partially Met    Target Date  11/27/19       Plan - 11/20/19 1736    Clinical Impression Statement  The patient continues to present wtih significant deficits in short-term memory. She requires assistance for most activities, however, she perseveres to complete them.    Speech Therapy Frequency  2x / week    Duration  Other (comment)    Treatment/Interventions   Internal/external aids;Compensatory strategies;Cognitive reorganization    Potential to Achieve Goals  Good    Potential Considerations  Ability to learn/carryover information;Family/community support;Previous level of function;Cooperation/participation level    Consulted and Agree with Plan of Care  Patient       Patient will benefit from skilled therapeutic intervention in order to improve the following deficits and impairments:   Cognitive communication deficit    Problem List Patient Active Problem List   Diagnosis Date Noted  . Uncontrolled type 2 diabetes mellitus with hyperglycemia, with long-term current use of insulin (HCC) 04/22/2019  . Panic attacks 03/26/2019  . Benign essential HTN 07/19/2018  . Fatty liver 05/15/2018  . Hx of adenomatous colonic polyps 05/15/2018  . Type 2 diabetes mellitus with diabetic nephropathy, with long-term current use of insulin (HCC) 05/01/2018  . Tinnitus, bilateral 04/05/2017  . Concussion syndrome 04/03/2017  . Concussion without loss of consciousness 01/24/2017  . Difficulty walking 01/24/2017  . Headache disorder 01/24/2017  . Numbness and tingling 01/24/2017  . Postural urinary incontinence 01/24/2017  . Sepsis (HCC) 09/21/2016  . UTI (urinary tract infection) 09/21/2016  . HTN (hypertension) 09/21/2016  . Diabetes (HCC) 09/21/2016  . Depression 09/21/2016  . Health care maintenance 10/05/2015  . Recurrent major depressive disorder, in full remission (HCC) 06/16/2014  . DM (diabetes mellitus) type II controlled, neurological manifestation (HCC) 06/12/2014  . Morbid obesity (HCC) 06/12/2014  . Hyperlipidemia 03/10/2014  . Chronic diastolic CHF (congestive heart failure) (HCC) 03/10/2014  . H/O diastolic dysfunction 03/10/2014  . Sleep apnea 03/10/2014  . SOB (shortness of breath) 03/10/2014      11/20/2019, 5:40 PM  [facility] [address] [place],  [place], [address] Phone: [phone]   Fax:  [phone]   Name: Judith Shaw MRN: [id number] Date of Birth: 01/23/1951 

## 2019-11-24 ENCOUNTER — Ambulatory Visit: Payer: Medicare PPO | Attending: Internal Medicine

## 2019-11-24 DIAGNOSIS — Z23 Encounter for immunization: Secondary | ICD-10-CM

## 2019-11-24 NOTE — Progress Notes (Signed)
   Covid-19 Vaccination Clinic  Name:  ZULEY KAREL    MRN: [id number] DOB: [birth year]  11/24/2019  Ms. Sponaugle was observed post Covid-19 immunization for 15 minutes without incident. She was provided with Vaccine Information Sheet and instruction to access the V-Safe system.   Ms. Zatorski was instructed to call 911 with any severe reactions post vaccine: Marland Kitchen Difficulty breathing  . Swelling of face and throat  . A fast heartbeat  . A bad rash all over body  . Dizziness and weakness   Immunizations Administered    Name Date Dose VIS Date Route   Pfizer COVID-19 Vaccine 11/24/2019  1:45 PM 0.3 mL 08/08/2019 Intramuscular   Manufacturer: [place]   Lot: [id number]   [place]: [id number]

## 2019-11-27 ENCOUNTER — Ambulatory Visit: Payer: Medicare PPO | Admitting: Speech Pathology

## 2019-11-30 DIAGNOSIS — R413 Other amnesia: Secondary | ICD-10-CM | POA: Insufficient documentation

## 2019-12-15 ENCOUNTER — Inpatient Hospital Stay
Admission: EM | Admit: 2019-12-15 | Discharge: 2019-12-24 | DRG: 240 | Disposition: A | Discharge status: Skilled Nursing Facility | Payer: Medicare PPO | Attending: Internal Medicine | Admitting: Internal Medicine

## 2019-12-15 ENCOUNTER — Encounter: Payer: Self-pay | Admitting: Emergency Medicine

## 2019-12-15 ENCOUNTER — Emergency Department: Payer: Medicare PPO

## 2019-12-15 ENCOUNTER — Other Ambulatory Visit: Payer: Self-pay

## 2019-12-15 ENCOUNTER — Inpatient Hospital Stay: Admission: AD | Admit: 2019-12-15 | Payer: Medicare PPO | Source: Ambulatory Visit | Admitting: Podiatry

## 2019-12-15 DIAGNOSIS — F0390 Unspecified dementia without behavioral disturbance: Secondary | ICD-10-CM | POA: Diagnosis present

## 2019-12-15 DIAGNOSIS — I70244 Atherosclerosis of native arteries of left leg with ulceration of heel and midfoot: Secondary | ICD-10-CM | POA: Diagnosis not present

## 2019-12-15 DIAGNOSIS — E1151 Type 2 diabetes mellitus with diabetic peripheral angiopathy without gangrene: Principal | ICD-10-CM | POA: Diagnosis present

## 2019-12-15 DIAGNOSIS — Z794 Long term (current) use of insulin: Secondary | ICD-10-CM | POA: Diagnosis not present

## 2019-12-15 DIAGNOSIS — G4730 Sleep apnea, unspecified: Secondary | ICD-10-CM | POA: Diagnosis not present

## 2019-12-15 DIAGNOSIS — E11649 Type 2 diabetes mellitus with hypoglycemia without coma: Secondary | ICD-10-CM | POA: Diagnosis not present

## 2019-12-15 DIAGNOSIS — Z90710 Acquired absence of both cervix and uterus: Secondary | ICD-10-CM

## 2019-12-15 DIAGNOSIS — L97528 Non-pressure chronic ulcer of other part of left foot with other specified severity: Secondary | ICD-10-CM | POA: Diagnosis not present

## 2019-12-15 DIAGNOSIS — M868X7 Other osteomyelitis, ankle and foot: Secondary | ICD-10-CM

## 2019-12-15 DIAGNOSIS — Z8542 Personal history of malignant neoplasm of other parts of uterus: Secondary | ICD-10-CM | POA: Diagnosis not present

## 2019-12-15 DIAGNOSIS — E1142 Type 2 diabetes mellitus with diabetic polyneuropathy: Secondary | ICD-10-CM | POA: Diagnosis present

## 2019-12-15 DIAGNOSIS — E11621 Type 2 diabetes mellitus with foot ulcer: Secondary | ICD-10-CM | POA: Diagnosis present

## 2019-12-15 DIAGNOSIS — E1169 Type 2 diabetes mellitus with other specified complication: Secondary | ICD-10-CM | POA: Diagnosis present

## 2019-12-15 DIAGNOSIS — L97524 Non-pressure chronic ulcer of other part of left foot with necrosis of bone: Secondary | ICD-10-CM | POA: Diagnosis present

## 2019-12-15 DIAGNOSIS — L03116 Cellulitis of left lower limb: Secondary | ICD-10-CM | POA: Diagnosis present

## 2019-12-15 DIAGNOSIS — Z79899 Other long term (current) drug therapy: Secondary | ICD-10-CM | POA: Diagnosis not present

## 2019-12-15 DIAGNOSIS — L97519 Non-pressure chronic ulcer of other part of right foot with unspecified severity: Secondary | ICD-10-CM | POA: Diagnosis present

## 2019-12-15 DIAGNOSIS — E119 Type 2 diabetes mellitus without complications: Secondary | ICD-10-CM

## 2019-12-15 DIAGNOSIS — Z20822 Contact with and (suspected) exposure to covid-19: Secondary | ICD-10-CM | POA: Diagnosis present

## 2019-12-15 DIAGNOSIS — E785 Hyperlipidemia, unspecified: Secondary | ICD-10-CM | POA: Diagnosis present

## 2019-12-15 DIAGNOSIS — I10 Essential (primary) hypertension: Secondary | ICD-10-CM | POA: Diagnosis not present

## 2019-12-15 DIAGNOSIS — Z7982 Long term (current) use of aspirin: Secondary | ICD-10-CM | POA: Diagnosis not present

## 2019-12-15 DIAGNOSIS — R739 Hyperglycemia, unspecified: Secondary | ICD-10-CM

## 2019-12-15 DIAGNOSIS — Z09 Encounter for follow-up examination after completed treatment for conditions other than malignant neoplasm: Secondary | ICD-10-CM

## 2019-12-15 DIAGNOSIS — Z833 Family history of diabetes mellitus: Secondary | ICD-10-CM

## 2019-12-15 DIAGNOSIS — Z791 Long term (current) use of non-steroidal anti-inflammatories (NSAID): Secondary | ICD-10-CM | POA: Diagnosis not present

## 2019-12-15 DIAGNOSIS — E1121 Type 2 diabetes mellitus with diabetic nephropathy: Secondary | ICD-10-CM | POA: Diagnosis present

## 2019-12-15 DIAGNOSIS — M86172 Other acute osteomyelitis, left ankle and foot: Secondary | ICD-10-CM | POA: Diagnosis present

## 2019-12-15 DIAGNOSIS — B954 Other streptococcus as the cause of diseases classified elsewhere: Secondary | ICD-10-CM | POA: Diagnosis present

## 2019-12-15 DIAGNOSIS — G4733 Obstructive sleep apnea (adult) (pediatric): Secondary | ICD-10-CM | POA: Diagnosis present

## 2019-12-15 DIAGNOSIS — I5032 Chronic diastolic (congestive) heart failure: Secondary | ICD-10-CM | POA: Diagnosis present

## 2019-12-15 DIAGNOSIS — F329 Major depressive disorder, single episode, unspecified: Secondary | ICD-10-CM | POA: Diagnosis present

## 2019-12-15 DIAGNOSIS — Z6841 Body Mass Index (BMI) 40.0 and over, adult: Secondary | ICD-10-CM

## 2019-12-15 DIAGNOSIS — I110 Hypertensive heart disease with heart failure: Secondary | ICD-10-CM | POA: Diagnosis present

## 2019-12-15 DIAGNOSIS — L97509 Non-pressure chronic ulcer of other part of unspecified foot with unspecified severity: Secondary | ICD-10-CM | POA: Diagnosis not present

## 2019-12-15 DIAGNOSIS — E1165 Type 2 diabetes mellitus with hyperglycemia: Secondary | ICD-10-CM | POA: Diagnosis present

## 2019-12-15 LAB — COMPREHENSIVE METABOLIC PANEL
ALT: 25 U/L (ref 0–44)
AST: 22 U/L (ref 15–41)
Albumin: 3.1 g/dL — ABNORMAL LOW (ref 3.5–5.0)
Alkaline Phosphatase: 81 U/L (ref 38–126)
Anion gap: 7 (ref 5–15)
BUN: 27 mg/dL — ABNORMAL HIGH (ref 8–23)
CO2: 23 mmol/L (ref 22–32)
Calcium: 8.9 mg/dL (ref 8.9–10.3)
Chloride: 102 mmol/L (ref 98–111)
Creatinine, Ser: 0.99 mg/dL (ref 0.44–1.00)
GFR calc Af Amer: >60 mL/min (ref >60)
GFR calc non Af Amer: 58 mL/min — ABNORMAL LOW (ref >60)
Glucose, Bld: 281 mg/dL — ABNORMAL HIGH (ref 70–99)
Potassium: 4.9 mmol/L (ref 3.5–5.1)
Sodium: 132 mmol/L — ABNORMAL LOW (ref 135–145)
Total Bilirubin: 0.5 mg/dL (ref 0.3–1.2)
Total Protein: 7.2 g/dL (ref 6.5–8.1)

## 2019-12-15 LAB — CBC WITH DIFFERENTIAL/PLATELET
Abs Immature Granulocytes: 0.03 10*3/uL (ref 0.00–0.07)
Basophils Absolute: 0 10*3/uL (ref 0.0–0.1)
Basophils Relative: 0 %
Eosinophils Absolute: 0.1 10*3/uL (ref 0.0–0.5)
Eosinophils Relative: 2 %
HCT: 42.7 % (ref 36.0–46.0)
Hemoglobin: 14.2 g/dL (ref 12.0–15.0)
Immature Granulocytes: 0 %
Lymphocytes Relative: 21 %
Lymphs Abs: 1.6 10*3/uL (ref 0.7–4.0)
MCH: 30.1 pg (ref 26.0–34.0)
MCHC: 33.3 g/dL (ref 30.0–36.0)
MCV: 90.5 fL (ref 80.0–100.0)
Monocytes Absolute: 0.5 10*3/uL (ref 0.1–1.0)
Monocytes Relative: 7 %
Neutro Abs: 5.2 10*3/uL (ref 1.7–7.7)
Neutrophils Relative %: 70 %
Platelets: 241 10*3/uL (ref 150–400)
RBC: 4.72 MIL/uL (ref 3.87–5.11)
RDW: 12.4 % (ref 11.5–15.5)
WBC: 7.4 10*3/uL (ref 4.0–10.5)
nRBC: 0 % (ref 0.0–0.2)

## 2019-12-15 LAB — LACTIC ACID, PLASMA
Lactic Acid, Venous: 2.1 mmol/L (ref 0.5–1.9)
Lactic Acid, Venous: 2 mmol/L (ref 0.5–1.9)

## 2019-12-15 IMAGING — CR DG FOOT COMPLETE 3+V*L*
1 series · 3 of 3 positions shown · non-contrast
Comparison: None.

CLINICAL DATA: Left foot ulcer.  Evaluate for osteomyelitis.

EXAM:
LEFT FOOT - COMPLETE 3+ VIEW

[Series 1: dg foot complete left · 0.14mm/px · 3 of 3 slices shown]
[im 1/3]
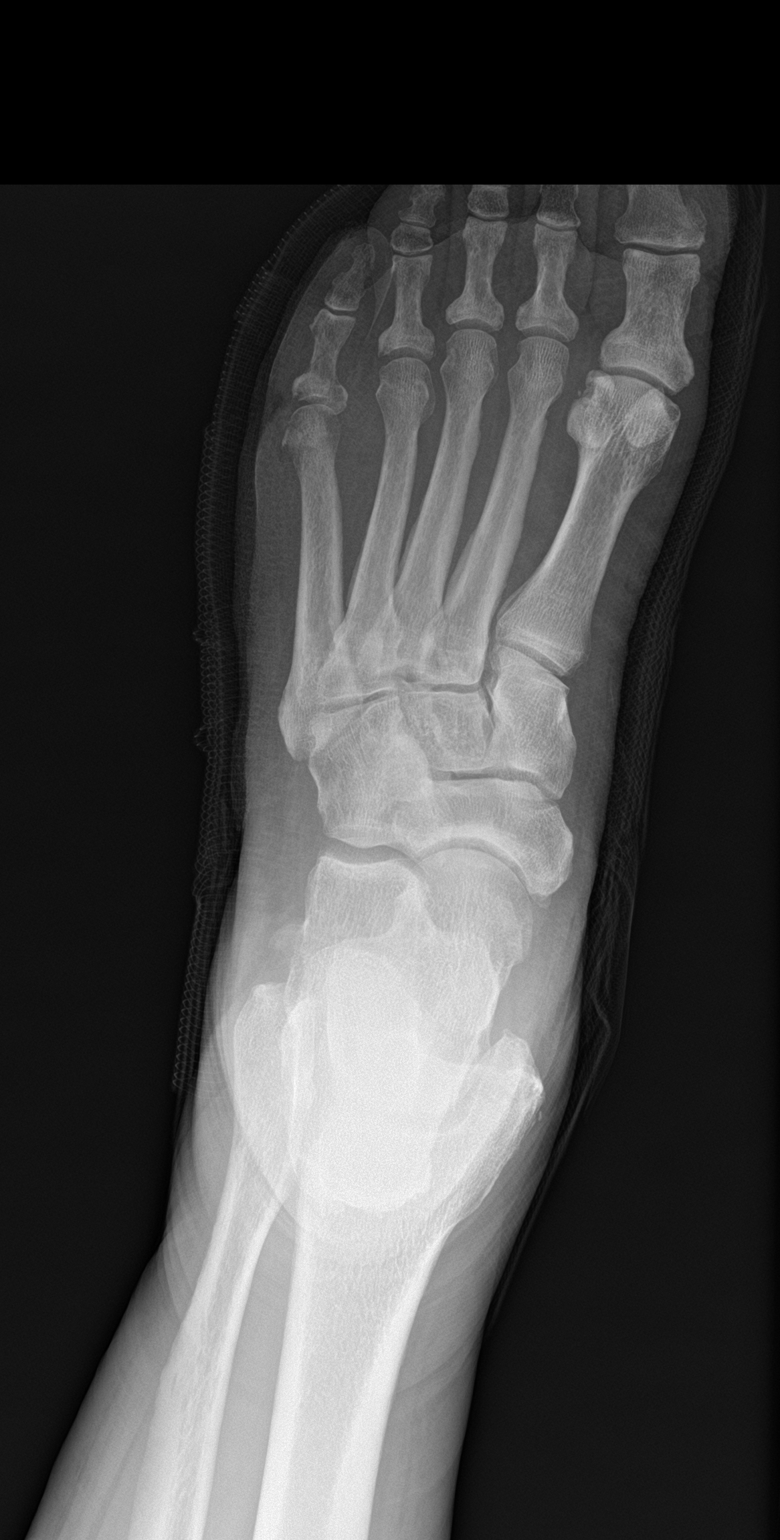
[im 2/3]
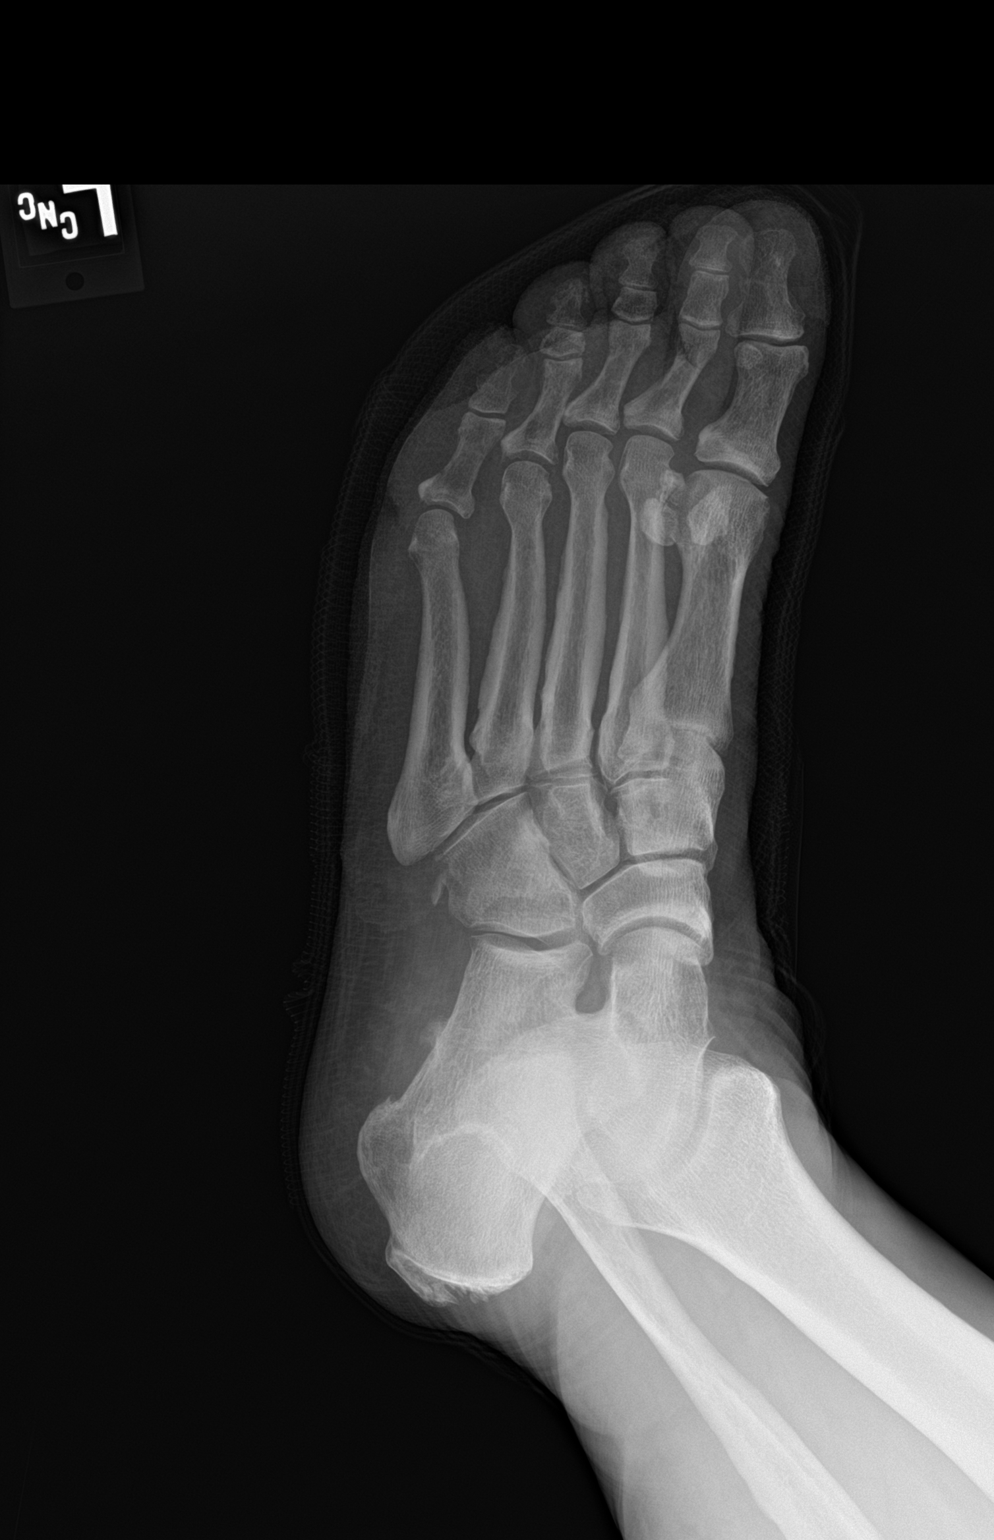
[im 3/3]
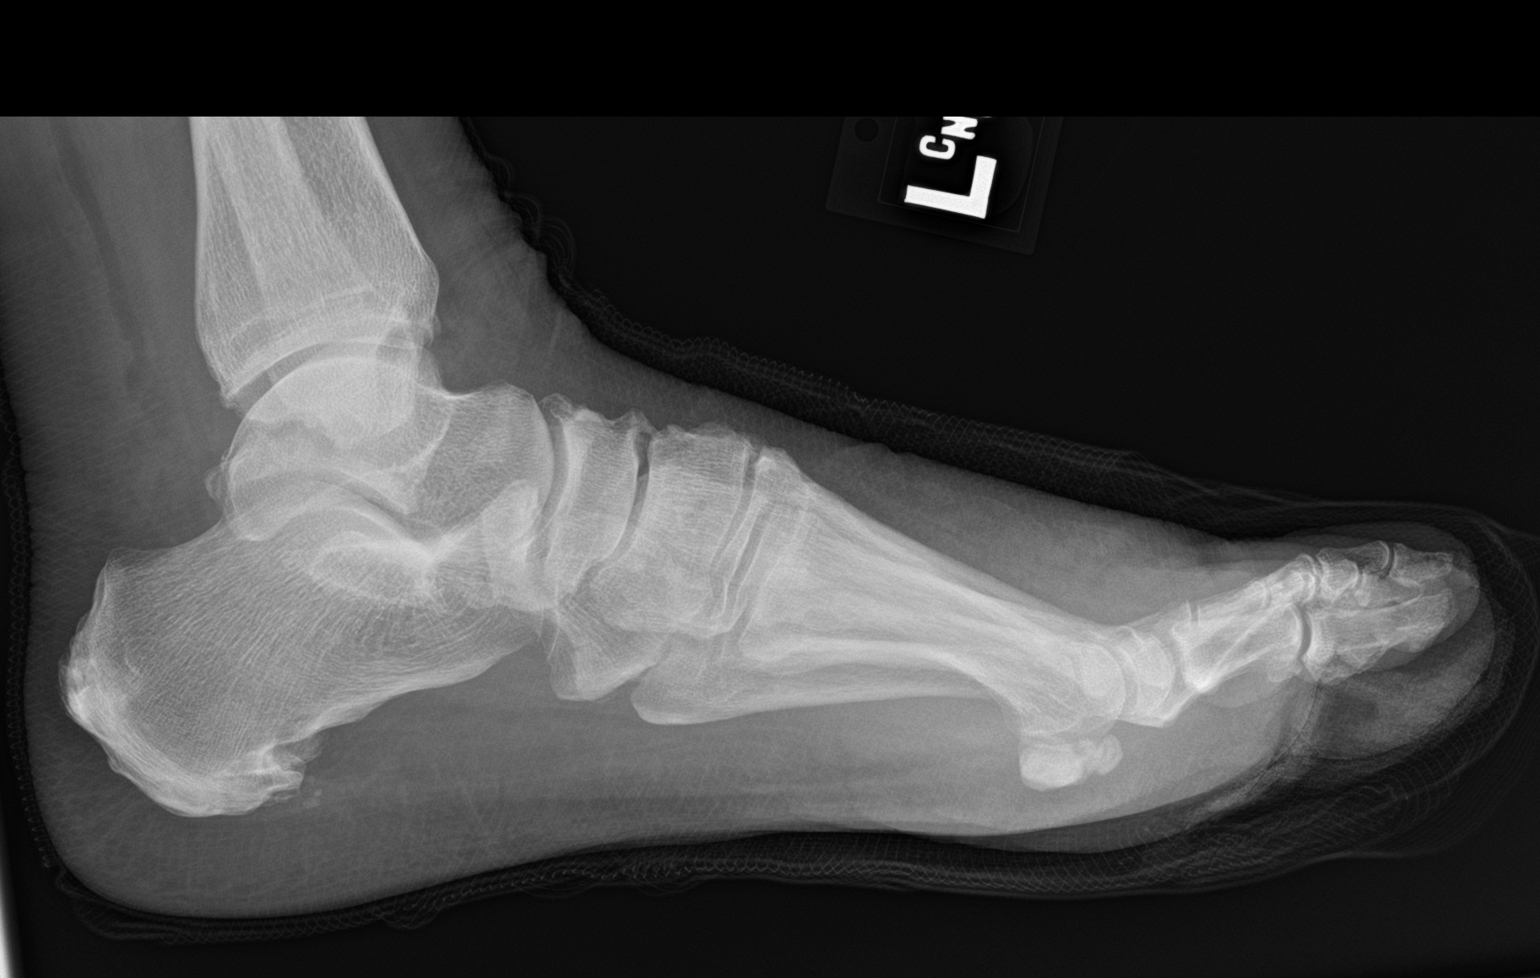

[3 of 3 positions shown; findings below may reference images not displayed]

FINDINGS: Soft tissue ulcer at the lateral aspect of the fifth MTP joint.
Early bony destruction of the fifth metatarsal head and fifth
proximal phalanx base. No acute fracture or dislocation. Joint
spaces are preserved. Dorsal midfoot degenerative spurring. Plantar
enthesophyte. Bone mineralization is normal.
IMPRESSION: 1. Soft tissue ulcer at the lateral aspect of the fifth MTP joint
with underlying early osteomyelitis of the fifth metatarsal head and
proximal phalanx.

## 2019-12-15 MED ORDER — DULOXETINE HCL 30 MG PO CPEP
30.0000 mg | ORAL_CAPSULE | Freq: Every day | ORAL | Status: DC
  Administered 2019-12-16 – 2019-12-24 (×8): 30 mg via ORAL
  Filled 2019-12-15 (×11): qty 1

## 2019-12-15 MED ORDER — LACTATED RINGERS IV BOLUS
1000.0000 mL | Freq: Once | INTRAVENOUS | Status: AC
  Administered 2019-12-15: 23:00:00 1000 mL via INTRAVENOUS

## 2019-12-15 MED ORDER — TRAZODONE HCL 50 MG PO TABS: 25.0000 mg | ORAL_TABLET | Freq: Every evening | ORAL | Status: DC | PRN

## 2019-12-15 MED ORDER — MAGNESIUM HYDROXIDE 400 MG/5ML PO SUSP: 30.0000 mL | Freq: Every day | ORAL | Status: DC | PRN

## 2019-12-15 MED ORDER — PIPERACILLIN-TAZOBACTAM 3.375 G IVPB 30 MIN: 3.3750 g | Freq: Once | INTRAVENOUS | Status: DC

## 2019-12-15 MED ORDER — VANCOMYCIN HCL 1250 MG/250ML IV SOLN
1250.0000 mg | INTRAVENOUS | Status: DC
  Administered 2019-12-16 – 2019-12-17 (×2): 1250 mg via INTRAVENOUS
  Filled 2019-12-15 (×2): qty 250

## 2019-12-15 MED ORDER — ONDANSETRON HCL 4 MG PO TABS: 4.0000 mg | ORAL_TABLET | Freq: Four times a day (QID) | ORAL | Status: DC | PRN

## 2019-12-15 MED ORDER — INSULIN ASPART 100 UNIT/ML ~~LOC~~ SOLN
0.0000 [IU] | Freq: Four times a day (QID) | SUBCUTANEOUS | Status: DC
  Administered 2019-12-16: 23:00:00 8 [IU] via SUBCUTANEOUS
  Administered 2019-12-16: 05:00:00 2 [IU] via SUBCUTANEOUS
  Administered 2019-12-16: 18:00:00 8 [IU] via SUBCUTANEOUS
  Administered 2019-12-17: 3 [IU] via SUBCUTANEOUS
  Administered 2019-12-17: 5 [IU] via SUBCUTANEOUS
  Administered 2019-12-17: 22:00:00 11 [IU] via SUBCUTANEOUS
  Administered 2019-12-18: 8 [IU] via SUBCUTANEOUS
  Administered 2019-12-18: 05:00:00 5 [IU] via SUBCUTANEOUS
  Filled 2019-12-15 (×8): qty 1

## 2019-12-15 MED ORDER — ACETAMINOPHEN 650 MG RE SUPP: 650.0000 mg | Freq: Four times a day (QID) | RECTAL | Status: DC | PRN

## 2019-12-15 MED ORDER — SODIUM CHLORIDE 0.9 % IV SOLN: INTRAVENOUS | Status: DC

## 2019-12-15 MED ORDER — BACLOFEN 10 MG PO TABS
5.0000 mg | ORAL_TABLET | Freq: Every day | ORAL | Status: DC
  Administered 2019-12-16 – 2019-12-23 (×8): 5 mg via ORAL
  Filled 2019-12-15 (×10): qty 0.5

## 2019-12-15 MED ORDER — LACTATED RINGERS IV BOLUS (SEPSIS): 800.0000 mL | Freq: Once | INTRAVENOUS | Status: DC

## 2019-12-15 MED ORDER — VANCOMYCIN HCL IN DEXTROSE 1-5 GM/200ML-% IV SOLN: 1000.0000 mg | Freq: Once | INTRAVENOUS | Status: DC

## 2019-12-15 MED ORDER — PIPERACILLIN-TAZOBACTAM 3.375 G IVPB
3.3750 g | Freq: Three times a day (TID) | INTRAVENOUS | Status: DC
  Administered 2019-12-16: 01:00:00 3.375 g via INTRAVENOUS
  Filled 2019-12-15: qty 50

## 2019-12-15 MED ORDER — LACTATED RINGERS IV BOLUS (SEPSIS): 1000.0000 mL | Freq: Once | INTRAVENOUS | Status: DC

## 2019-12-15 MED ORDER — INSULIN ASPART 100 UNIT/ML ~~LOC~~ SOLN
6.0000 [IU] | Freq: Once | SUBCUTANEOUS | Status: AC
  Administered 2019-12-15: 6 [IU] via INTRAVENOUS
  Filled 2019-12-15: qty 1

## 2019-12-15 MED ORDER — SIMVASTATIN 20 MG PO TABS
40.0000 mg | ORAL_TABLET | Freq: Every morning | ORAL | Status: DC
  Administered 2019-12-16 – 2019-12-23 (×7): 40 mg via ORAL
  Filled 2019-12-15 (×7): qty 2

## 2019-12-15 MED ORDER — POTASSIUM CHLORIDE CRYS ER 20 MEQ PO TBCR: 10.0000 meq | EXTENDED_RELEASE_TABLET | Freq: Every day | ORAL | Status: DC

## 2019-12-15 MED ORDER — RAMIPRIL 5 MG PO CAPS
5.0000 mg | ORAL_CAPSULE | Freq: Every day | ORAL | Status: DC
  Administered 2019-12-18 – 2019-12-24 (×6): 5 mg via ORAL
  Filled 2019-12-15 (×10): qty 1

## 2019-12-15 MED ORDER — MIRABEGRON ER 25 MG PO TB24
25.0000 mg | ORAL_TABLET | Freq: Every day | ORAL | Status: DC
  Administered 2019-12-16 – 2019-12-24 (×8): 25 mg via ORAL
  Filled 2019-12-15 (×10): qty 1

## 2019-12-15 MED ORDER — SODIUM CHLORIDE 0.9 % IV SOLN: Freq: Once | INTRAVENOUS | Status: AC

## 2019-12-15 MED ORDER — VANCOMYCIN HCL 2000 MG/400ML IV SOLN
2000.0000 mg | Freq: Once | INTRAVENOUS | Status: AC
  Administered 2019-12-15: 2000 mg via INTRAVENOUS
  Filled 2019-12-15: qty 400

## 2019-12-15 MED ORDER — INSULIN REGULAR HUMAN (CONC) 500 UNIT/ML ~~LOC~~ SOPN
72.0000 [IU] | PEN_INJECTOR | Freq: Three times a day (TID) | SUBCUTANEOUS | Status: DC
  Administered 2019-12-16: 02:00:00 70 [IU] via SUBCUTANEOUS
  Filled 2019-12-15: qty 3

## 2019-12-15 MED ORDER — CARVEDILOL 3.125 MG PO TABS
3.1250 mg | ORAL_TABLET | Freq: Two times a day (BID) | ORAL | Status: DC
  Administered 2019-12-16 – 2019-12-23 (×15): 3.125 mg via ORAL
  Filled 2019-12-15 (×16): qty 1

## 2019-12-15 MED ORDER — FUROSEMIDE 20 MG PO TABS
20.0000 mg | ORAL_TABLET | Freq: Every day | ORAL | Status: DC
  Administered 2019-12-16: 20 mg via ORAL
  Filled 2019-12-15: qty 1

## 2019-12-15 MED ORDER — ONDANSETRON HCL 4 MG/2ML IJ SOLN: 4.0000 mg | Freq: Four times a day (QID) | INTRAMUSCULAR | Status: DC | PRN

## 2019-12-15 MED ORDER — ACETAMINOPHEN 325 MG PO TABS: 650.0000 mg | ORAL_TABLET | Freq: Four times a day (QID) | ORAL | Status: DC | PRN

## 2019-12-15 NOTE — H&P (Addendum)
[facility] [place] NAME: Judith Shaw    MR#:  [id number]  DATE OF BIRTH:  02/02/1951  DATE OF ADMISSION:  12/15/2019  PRIMARY CARE PHYSICIAN: Kirk Ruths, MD   REQUESTING/REFERRING PHYSICIAN: Devonne Doughty, MD CHIEF COMPLAINT:  Left foot worsening ulcer HISTORY OF PRESENT ILLNESS:  Judith Shaw  is a 69 y.o. Caucasian female with a known history of type diabetes mellitus, hypertension comes lipidemia and CHF, who presented to the emergency room with acute onset of left foot worsening diabetic ulcer with associated erythema and mild tenderness with occasional pain.  She denies any fever or chills.  No nausea or vomiting or abdominal pain.  No chest pain or dyspnea or cough or wheezing.  No recent exposure to COVID-19.  No dysuria, oliguria or hematuria or flank pain.   Upon presentation to the emergency room, blood pressure was 138/58 with otherwise normal vital signs.  Labs revealed mild hyponatremia blood glucose is 281, lactic acid of 2.1 and later to with unremarkable CBC.  COVID-19 PCR is currently pending.  Left foot x-ray revealed soft tissue ulcer at the lateral aspect of the fifth MTP joint with underlying early osteomyelitis of the fifth metatarsal head and proximal phalanx.  The patient was given 1 L bolus of IV lactated Ringer, 6 units of NovoLog and IV vancomycin and Zosyn.  She will be admitted to a medical bed for further evaluation and management. PAST MEDICAL HISTORY:   Past Medical History:  Diagnosis Date  . Anxiety   . Cancer ([place])    pt states hx of uterine cancer and had a complete hyst  . CHF (congestive heart failure) ([place])   . Depression   . Diabetes mellitus without complication ([place])   . Hyperlipidemia   . Hypertension     PAST SURGICAL HISTORY:   Past Surgical History:  Procedure Laterality Date  . ABDOMINAL HYSTERECTOMY    . CHOLECYSTECTOMY      SOCIAL HISTORY:   Social History   Tobacco Use  . Smoking  status: Never Smoker  . Smokeless tobacco: Never Used  Substance Use Topics  . Alcohol use: No    FAMILY HISTORY:   Family History  Problem Relation Age of Onset  . Hypertension Mother   . Heart disease Father   . Prostate cancer Brother   . Diabetes Maternal Aunt   . Kidney cancer Neg Hx   . Bladder Cancer Neg Hx     DRUG ALLERGIES:   Allergies  Allergen Reactions  . Codeine     REVIEW OF SYSTEMS:   ROS As per history of present illness. All pertinent systems were reviewed above. Constitutional,  HEENT, cardiovascular, respiratory, GI, GU, musculoskeletal, neuro, psychiatric, endocrine,  integumentary and hematologic systems were reviewed and are otherwise  negative/unremarkable except for positive findings mentioned above in the HPI.   MEDICATIONS AT HOME:   Prior to Admission medications   Medication Sig Start Date End Date Taking? Authorizing Provider  aspirin EC 81 MG tablet Take 81 mg by mouth daily.    [provider]  Baclofen 5 MG TABS Take 5 mg by mouth at bedtime. 06/12/19   Laban Emperor, PA-C  carvedilol (COREG) 3.125 MG tablet Take 3.125 mg by mouth 2 (two) times daily with a meal.    [provider]  clotrimazole-betamethasone (LOTRISONE) cream Apply 1 application topically 2 (two) times daily.    [provider]  DULoxetine (CYMBALTA) 30 MG capsule Take 30  mg by mouth daily.    [provider]  furosemide (LASIX) 20 MG tablet Take 20 mg every other day in the morning. 11/29/16   [provider]  insulin regular human CONCENTRATED (HUMULIN R U-500 KWIKPEN) 500 UNIT/ML kwikpen Inject 90 Units into the skin 3 (three) times daily with meals. 04/17/19   Nena Polio, MD  insulin regular human CONCENTRATED (HUMULIN R) 500 UNIT/ML kwikpen Inject 90 Units into the skin 3 (three) times daily. 06/12/18 06/12/19  [provider]  meloxicam (MOBIC) 7.5 MG tablet Take 1 tablet (7.5 mg total) by mouth daily.  06/12/19 06/11/20  Laban Emperor, PA-C  metFORMIN (GLUCOPHAGE-XR) 500 MG 24 hr tablet Take 1,000 mg by mouth 2 (two) times daily.    [provider]  mirabegron ER (MYRBETRIQ) 25 MG TB24 tablet Take 1 tablet (25 mg total) by mouth daily. 07/01/19   Zara Council A, PA-C  potassium chloride (K-DUR,KLOR-CON) 10 MEQ tablet Take 10 mEq by mouth daily.    [provider]  ramipril (ALTACE) 5 MG capsule Take 5 mg by mouth daily.    [provider]  simvastatin (ZOCOR) 40 MG tablet Take 40 mg by mouth every morning.     [provider]      VITAL SIGNS:  Blood pressure (!) 124/58, pulse 81, temperature 98.2 F (36.8 C), temperature source Oral, resp. rate 17, height 5\' 5"  (1.651 m), weight 122.5 kg, SpO2 98 %.  PHYSICAL EXAMINATION:  Physical Exam  GENERAL:  69 y.o.-year-old Caucasian female patient lying in the bed with no acute distress.  EYES: Pupils equal, round, reactive to light and accommodation. No scleral icterus. Extraocular muscles intact.  HEENT: Head atraumatic, normocephalic. Oropharynx and nasopharynx clear.  NECK:  Supple, no jugular venous distention. No thyroid enlargement, no tenderness.  LUNGS: Normal breath sounds bilaterally, no wheezing, rales,rhonchi or crepitation. No use of accessory muscles of respiration.  CARDIOVASCULAR: Regular rate and rhythm, S1, S2 normal. No murmurs, rubs, or gallops.  ABDOMEN: Soft, nondistended, nontender. Bowel sounds present. No organomegaly or mass.  EXTREMITIES: No pedal edema, cyanosis, or clubbing.  NEUROLOGIC: Cranial nerves II through XII are intact. Muscle strength 5/5 in all extremities. Sensation intact. Gait not checked.  PSYCHIATRIC: The patient is alert and oriented x 3.  Normal affect and good eye contact. SKIN: Left lateral foot deep ulcer with purulent base and surrounding erythema with induration.    LABORATORY PANEL:   CBC Recent Labs  Lab 12/15/19 1733  WBC 7.4  HGB 14.2  HCT  42.7  PLT 241   ------------------------------------------------------------------------------------------------------------------  Chemistries  Recent Labs  Lab 12/15/19 1733  NA 132*  K 4.9  CL 102  CO2 23  GLUCOSE 281*  BUN 27*  CREATININE 0.99  CALCIUM 8.9  AST 22  ALT 25  ALKPHOS 81  BILITOT 0.5   ------------------------------------------------------------------------------------------------------------------  Cardiac Enzymes No results for input(s): TROPONINI in the last 168 hours. ------------------------------------------------------------------------------------------------------------------  RADIOLOGY:  DG Foot Complete Left  Result Date: 12/15/2019 CLINICAL DATA:  Left foot ulcer.  Evaluate for osteomyelitis. EXAM: LEFT FOOT - COMPLETE 3+ VIEW COMPARISON:  None. FINDINGS: Soft tissue ulcer at the lateral aspect of the fifth MTP joint. Early bony destruction of the fifth metatarsal head and fifth proximal phalanx base. No acute fracture or dislocation. Joint spaces are preserved. Dorsal midfoot degenerative spurring. Plantar enthesophyte. Bone mineralization is normal. IMPRESSION: 1. Soft tissue ulcer at the lateral aspect of the fifth MTP joint with underlying early osteomyelitis of  the fifth metatarsal head and proximal phalanx. Electronically Signed   By: Titus Dubin M.D.   On: 12/15/2019 18:21      IMPRESSION AND PLAN:   1.  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 -The patient was admitted to a medical bed. -We will continue antibiotic therapy with IV vancomycin and Zosyn. -Pain management will be provided. -Podiatry consult will be obtained in a.m. -Notified Dr. Luana Shu about the patient.  2.  Uncontrolled type 2 diabetes mellitus with hyperglycemia. -The patient will be placed on supplement coverage with her Humulin RU 500 and basal  coverage. -We will hold off Glucophage.  3.  Essential hypertension. -We will continue Coreg and ramipril.  4.  Dyslipidemia. -We will continue statin therapy.  5.  Obstructive sleep apnea. -We will place on CPAP here.  6.  DVT prophylaxis. -SCDs. -Medical prophylaxis currently held off pending surgical evaluation.  All the records are reviewed and case discussed with ED provider. The plan of care was discussed in details with the patient (and family). I answered all questions. The patient agreed to proceed with the above mentioned plan. Further management will depend upon hospital course.   CODE STATUS: Full code  Status is: Inpatient  Remains inpatient appropriate because:Unsafe d/c plan, IV treatments appropriate due to intensity of illness or inability to take PO and Inpatient level of care appropriate due to severity of illness   Dispo: The patient is from: Home              Anticipated d/c is to: Home              Anticipated d/c date is: 3 days              Patient currently is not medically stable to d/c.   TOTAL TIME TAKING CARE OF THIS PATIENT: 55 minutes.    Christel Mormon M.D on 12/15/2019 at 10:32 PM  [facility]   From 7 PM-7 AM, contact night-coverage [email or web address]  CC: Primary care physician; Kirk Ruths, MD   Note: This dictation was prepared with Dragon dictation along with smaller phrase technology. Any transcriptional errors that result from this process are unintentional.

## 2019-12-15 NOTE — ED Triage Notes (Signed)
Pt presents to ED via POV from podiatrist office with concerns of possible osteomyelitis. Pt with ulcer to L foot, pt referred to ED for further eval.

## 2019-12-15 NOTE — Progress Notes (Signed)
Pharmacy Antibiotic Note  Judith Shaw is a 69 y.o. female admitted on 12/15/2019 with osteomyelitis.  Pharmacy has been consulted for vanc/zosyn dosing.  Plan: Patient received vanc 2g IV x 1  Vancomycin 1250 mg IV Q 24 hrs. Goal AUC 400-550. Expected AUC: 459.1 SCr used: 0.99 Cssmin: 11.4  Will continue zosyn 3.375g IV q8h per CrCl > 20 ml/min and will continue to monitor.  Height: 5\' 5"  (165.1 cm) Weight: 122.5 kg (270 lb) IBW/kg (Calculated) : 57  Temp (24hrs), Avg:98.2 F (36.8 C), Min:98.1 F (36.7 C), Max:98.2 F (36.8 C)  Recent Labs  Lab 12/15/19 1733 12/15/19 2117  WBC 7.4  --   CREATININE 0.99  --   LATICACIDVEN 2.1* 2.0*    Estimated Creatinine Clearance: 70.4 mL/min (by C-G formula based on SCr of 0.99 mg/dL).    Allergies  Allergen Reactions  . Codeine     Thank you for allowing pharmacy to be a part of this patient's care.  Tobie Lords, PharmD, BCPS Clinical Pharmacist 12/15/2019 11:01 PM

## 2019-12-15 NOTE — Consult Note (Signed)
PHARMACY -  BRIEF ANTIBIOTIC NOTE   Pharmacy has received consult(s) for vancomcyin from an ED provider.  The patient's profile has been reviewed for ht/wt/allergies/indication/available labs.    One time order(s) placed for vancomycin 1g IV already ordered by MD. Will order another vancomycin 1g IV for a total of 2000mg .   Further antibiotics/pharmacy consults should be ordered by admitting physician if indicated.                       Thank you, Pernell Dupre, PharmD, BCPS Clinical Pharmacist 12/15/2019 9:33 PM

## 2019-12-15 NOTE — ED Notes (Signed)
Provided update to pt's sister Robyn Haber.

## 2019-12-15 NOTE — ED Notes (Signed)
IV team consult put in for pt. Myself and RN Eugene Garnet attempted Iv access w/o success.

## 2019-12-15 NOTE — ED Notes (Signed)
This RN spoke with EDP regarding patient care, VORB for blood work and imaging.

## 2019-12-15 NOTE — ED Provider Notes (Signed)
[facility] Emergency Department Provider Note    First MD Initiated Contact with Patient 12/15/19 2103     (approximate)  I have reviewed the triage vital signs and the nursing notes.   HISTORY  Chief Complaint Left foot wound    HPI Judith Shaw is a 69 y.o. female below listed past medical history  presents to the ER for evaluation progressive worsening wound to left foot, concern for osteomyelitis.  Patient has been suffering from cellulitis since the beginning of the month.  Does have a history of diabetes and states it has been very difficult to control.  Had outpatient visit today with x-ray showing evidence of osteo-.  Has developed ulceration to lateral foot as well.  She not having measured fevers or chills.  No nausea or vomiting.   Past Medical History:  Diagnosis Date  . Anxiety   . Cancer ([place])    pt states hx of uterine cancer and had a complete hyst  . CHF (congestive heart failure) ([place])   . Depression   . Diabetes mellitus without complication ([place])   . Hyperlipidemia   . Hypertension    Family History  Problem Relation Age of Onset  . Hypertension Mother   . Heart disease Father   . Prostate cancer Brother   . Diabetes Maternal Aunt   . Kidney cancer Neg Hx   . Bladder Cancer Neg Hx    Past Surgical History:  Procedure Laterality Date  . ABDOMINAL HYSTERECTOMY    . CHOLECYSTECTOMY     Patient Active Problem List   Diagnosis Date Noted  . Uncontrolled type 2 diabetes mellitus with hyperglycemia, with long-term current use of insulin ([place]) 04/22/2019  . Panic attacks 03/26/2019  . Benign essential HTN 07/19/2018  . Fatty liver 05/15/2018  . Hx of adenomatous colonic polyps 05/15/2018  . Type 2 diabetes mellitus with diabetic nephropathy, with long-term current use of insulin ([place]) 05/01/2018  . Tinnitus, bilateral 04/05/2017  . Concussion syndrome 04/03/2017  . Concussion without loss of consciousness 01/24/2017  .  Difficulty walking 01/24/2017  . Headache disorder 01/24/2017  . Numbness and tingling 01/24/2017  . Postural urinary incontinence 01/24/2017  . Sepsis ([place]) 09/21/2016  . UTI (urinary tract infection) 09/21/2016  . HTN (hypertension) 09/21/2016  . Diabetes ([place]) 09/21/2016  . Depression 09/21/2016  . Health care maintenance 10/05/2015  . Recurrent major depressive disorder, in full remission ([place]) 06/16/2014  . DM (diabetes mellitus) type II controlled, neurological manifestation ([place]) 06/12/2014  . Morbid obesity ([place]) 06/12/2014  . Hyperlipidemia 03/10/2014  . Chronic diastolic CHF (congestive heart failure) ([place]) 03/10/2014  . H/O diastolic dysfunction [id number]  . Sleep apnea 03/10/2014  . SOB (shortness of breath) 03/10/2014      Prior to Admission medications   Medication Sig Start Date End Date Taking? Authorizing Provider  aspirin EC 81 MG tablet Take 81 mg by mouth daily.    [provider]  Baclofen 5 MG TABS Take 5 mg by mouth at bedtime. 06/12/19   Laban Emperor, PA-C  carvedilol (COREG) 3.125 MG tablet Take 3.125 mg by mouth 2 (two) times daily with a meal.    [provider]  clotrimazole-betamethasone (LOTRISONE) cream Apply 1 application topically 2 (two) times daily.    [provider]  DULoxetine (CYMBALTA) 30 MG capsule Take 30 mg by mouth daily.    [provider]  furosemide (LASIX) 20 MG tablet Take 20 mg every other day in the morning.  11/29/16   [provider]  insulin regular human CONCENTRATED (HUMULIN R U-500 KWIKPEN) 500 UNIT/ML kwikpen Inject 90 Units into the skin 3 (three) times daily with meals. 04/17/19   Nena Polio, MD  insulin regular human CONCENTRATED (HUMULIN R) 500 UNIT/ML kwikpen Inject 90 Units into the skin 3 (three) times daily. 06/12/18 06/12/19  [provider]  meloxicam (MOBIC) 7.5 MG tablet Take 1 tablet (7.5 mg total) by mouth daily. 06/12/19 06/11/20  Laban Emperor, PA-C   metFORMIN (GLUCOPHAGE-XR) 500 MG 24 hr tablet Take 1,000 mg by mouth 2 (two) times daily.    [provider]  mirabegron ER (MYRBETRIQ) 25 MG TB24 tablet Take 1 tablet (25 mg total) by mouth daily. 07/01/19   Zara Council A, PA-C  potassium chloride (K-DUR,KLOR-CON) 10 MEQ tablet Take 10 mEq by mouth daily.    [provider]  ramipril (ALTACE) 5 MG capsule Take 5 mg by mouth daily.    [provider]  simvastatin (ZOCOR) 40 MG tablet Take 40 mg by mouth every morning.     [provider]    Allergies Codeine    Social History Social History   Tobacco Use  . Smoking status: Never Smoker  . Smokeless tobacco: Never Used  Substance Use Topics  . Alcohol use: No  . Drug use: No    Review of Systems Patient denies headaches, rhinorrhea, blurry vision, numbness, shortness of breath, chest pain, edema, cough, abdominal pain, nausea, vomiting, diarrhea, dysuria, fevers, rashes or hallucinations unless otherwise stated above in HPI. ____________________________________________   PHYSICAL EXAM:  VITAL SIGNS: Vitals:   12/15/19 1724  BP: (!) 138/58  Pulse: 78  Resp: 18  Temp: 98.1 F (36.7 C)  SpO2: 95%    Constitutional: Alert and oriented.  Eyes: Conjunctivae are normal.  Head: Atraumatic. Nose: No congestion/rhinnorhea. Mouth/Throat: Mucous membranes are moist.   Neck: No stridor. Painless ROM.  Cardiovascular: Normal rate, regular rhythm. Grossly normal heart sounds.  Good peripheral circulation. Respiratory: Normal respiratory effort.  No retractions. Lungs CTAB. Gastrointestinal: Soft and nontender. No distention. No abdominal bruits. No CVA tenderness. Genitourinary:  Musculoskeletal: No lower extremity tenderness nor edema.  No joint effusions.  There is a diabetic foot ulcer to the lateral base of the fifth metatarsal.  Does have some surrounding cellulitis.  No purulent drainage at this time.  No crepitus. Neurologic:   Normal speech and language. No gross focal neurologic deficits are appreciated. No facial droop Skin:  Skin is warm, dry and intact. No rash noted. Psychiatric: Mood and affect are normal. Speech and behavior are normal.  ____________________________________________   LABS (all labs ordered are listed, but only abnormal results are displayed)  Results for orders placed or performed during the hospital encounter of 12/15/19 (from the past 24 hour(s))  CBC with Differential     Status: None   Collection Time: 12/15/19  5:33 PM  Result Value Ref Range   WBC 7.4 4.0 - 10.5 K/uL   RBC 4.72 3.87 - 5.11 MIL/uL   Hemoglobin 14.2 12.0 - 15.0 g/dL   HCT 42.7 36.0 - 46.0 %   MCV 90.5 80.0 - 100.0 fL   MCH 30.1 26.0 - 34.0 pg   MCHC 33.3 30.0 - 36.0 g/dL   RDW 12.4 11.5 - 15.5 %   Platelets 241 150 - 400 K/uL   nRBC 0.0 0.0 - 0.2 %   Neutrophils Relative % 70 %   Neutro Abs 5.2 1.7 - 7.7 K/uL  Lymphocytes Relative 21 %   Lymphs Abs 1.6 0.7 - 4.0 K/uL   Monocytes Relative 7 %   Monocytes Absolute 0.5 0.1 - 1.0 K/uL   Eosinophils Relative 2 %   Eosinophils Absolute 0.1 0.0 - 0.5 K/uL   Basophils Relative 0 %   Basophils Absolute 0.0 0.0 - 0.1 K/uL   Immature Granulocytes 0 %   Abs Immature Granulocytes 0.03 0.00 - 0.07 K/uL  Comprehensive metabolic panel     Status: Abnormal   Collection Time: 12/15/19  5:33 PM  Result Value Ref Range   Sodium 132 (L) 135 - 145 mmol/L   Potassium 4.9 3.5 - 5.1 mmol/L   Chloride 102 98 - 111 mmol/L   CO2 23 22 - 32 mmol/L   Glucose, Bld 281 (H) 70 - 99 mg/dL   BUN 27 (H) 8 - 23 mg/dL   Creatinine, Ser 0.99 0.44 - 1.00 mg/dL   Calcium 8.9 8.9 - 10.3 mg/dL   Total Protein 7.2 6.5 - 8.1 g/dL   Albumin 3.1 (L) 3.5 - 5.0 g/dL   AST 22 15 - 41 U/L   ALT 25 0 - 44 U/L   Alkaline Phosphatase 81 38 - 126 U/L   Total Bilirubin 0.5 0.3 - 1.2 mg/dL   GFR calc non Af Amer 58 (L) >60 mL/min   GFR calc Af Amer >60 >60 mL/min   Anion gap 7 5 - 15  Lactic  acid, plasma     Status: Abnormal   Collection Time: 12/15/19  5:33 PM  Result Value Ref Range   Lactic Acid, Venous 2.1 (HH) 0.5 - 1.9 mmol/L   ____________________________________________ _____________________________  RADIOLOGY  I personally reviewed all radiographic images ordered to evaluate for the above acute complaints and reviewed radiology reports and findings.  These findings were personally discussed with the patient.  Please see medical record for radiology report.  ____________________________________________   PROCEDURES  Procedure(s) performed:  Procedures    Critical Care performed: no ____________________________________________   INITIAL IMPRESSION / ASSESSMENT AND PLAN / ED COURSE  Pertinent labs & imaging results that were available during my care of the patient were reviewed by me and considered in my medical decision making (see chart for details).   DDX: Osteomyelitis, cellulitis, and STI, electrolyte normality, ischemic limb  Judith Shaw is a 69 y.o. who presents to the ED with evidence of osteomyelitis secondary to diabetic foot ulcer.  Patient does have poorly controlled diabetes.  Lactate is elevated but she is not febrile.  No tachycardia.  Does not meet sepsis criteria at this time but will give IV fluids as well as IV antibiotics.  Will treat hyperglycemia with IV insulin.  Patient will require hospitalization for further medical management.  The patient will be placed on continuous pulse oximetry and telemetry for monitoring.  Laboratory evaluation will be sent to evaluate for the above complaints.  Have discussed with the patient and available family all diagnostics and treatments performed thus far and all questions were answered to the best of my ability. The patient demonstrates understanding and agreement with plan.      The patient was evaluated in Emergency Department today for the symptoms described in the history of present illness. He/she  was evaluated in the context of the global COVID-19 pandemic, which necessitated consideration that the patient might be at risk for infection with the SARS-CoV-2 virus that causes COVID-19. Institutional protocols and algorithms that pertain to the evaluation of patients at risk for  COVID-19 are in a state of rapid change based on information released by regulatory bodies including the CDC and federal and state organizations. These policies and algorithms were followed during the patient's care in the ED.  As part of my medical decision making, I reviewed the following data within the [place] notes reviewed and incorporated, Labs reviewed, notes from prior ED visits and [place] Controlled Substance Database   ____________________________________________   FINAL CLINICAL IMPRESSION(S) / ED DIAGNOSES  Final diagnoses:  Diabetic ulcer of other part of left foot associated with type 2 diabetes mellitus, with other ulcer severity ([place])  Other osteomyelitis of left foot ([place])  Hyperglycemia      NEW MEDICATIONS STARTED DURING THIS VISIT:  New Prescriptions   No medications on file     Note:  This document was prepared using Dragon voice recognition software and may include unintentional dictation errors.    Merlyn Lot, MD 12/15/19 2125

## 2019-12-15 NOTE — ED Notes (Signed)
IV fluids and antibiotics have not been started at this time r/t inability to get IV access.

## 2019-12-16 ENCOUNTER — Other Ambulatory Visit (INDEPENDENT_AMBULATORY_CARE_PROVIDER_SITE_OTHER): Payer: Self-pay | Admitting: Vascular Surgery

## 2019-12-16 DIAGNOSIS — I5032 Chronic diastolic (congestive) heart failure: Secondary | ICD-10-CM | POA: Diagnosis not present

## 2019-12-16 DIAGNOSIS — M86172 Other acute osteomyelitis, left ankle and foot: Secondary | ICD-10-CM | POA: Diagnosis not present

## 2019-12-16 DIAGNOSIS — E11621 Type 2 diabetes mellitus with foot ulcer: Secondary | ICD-10-CM | POA: Diagnosis not present

## 2019-12-16 DIAGNOSIS — M868X7 Other osteomyelitis, ankle and foot: Secondary | ICD-10-CM

## 2019-12-16 DIAGNOSIS — L97528 Non-pressure chronic ulcer of other part of left foot with other specified severity: Secondary | ICD-10-CM

## 2019-12-16 DIAGNOSIS — F0390 Unspecified dementia without behavioral disturbance: Secondary | ICD-10-CM | POA: Diagnosis not present

## 2019-12-16 DIAGNOSIS — E785 Hyperlipidemia, unspecified: Secondary | ICD-10-CM

## 2019-12-16 LAB — CBC
HCT: 39.2 % (ref 36.0–46.0)
Hemoglobin: 13.4 g/dL (ref 12.0–15.0)
MCH: 30.2 pg (ref 26.0–34.0)
MCHC: 34.2 g/dL (ref 30.0–36.0)
MCV: 88.3 fL (ref 80.0–100.0)
Platelets: 225 10*3/uL (ref 150–400)
RBC: 4.44 MIL/uL (ref 3.87–5.11)
RDW: 12.7 % (ref 11.5–15.5)
WBC: 7.4 10*3/uL (ref 4.0–10.5)
nRBC: 0 % (ref 0.0–0.2)

## 2019-12-16 LAB — GLUCOSE, CAPILLARY
Glucose-Capillary: 125 mg/dL — ABNORMAL HIGH (ref 70–99)
Glucose-Capillary: 253 mg/dL — ABNORMAL HIGH (ref 70–99)
Glucose-Capillary: 270 mg/dL — ABNORMAL HIGH (ref 70–99)
Glucose-Capillary: 75 mg/dL (ref 70–99)
Glucose-Capillary: 85 mg/dL (ref 70–99)
Glucose-Capillary: 94 mg/dL (ref 70–99)
Glucose-Capillary: 95 mg/dL (ref 70–99)

## 2019-12-16 LAB — HEMOGLOBIN A1C
Hgb A1c MFr Bld: 11.3 % — ABNORMAL HIGH (ref 4.8–5.6)
Mean Plasma Glucose: 277.61 mg/dL

## 2019-12-16 LAB — BASIC METABOLIC PANEL
Anion gap: 7 (ref 5–15)
BUN: 25 mg/dL — ABNORMAL HIGH (ref 8–23)
CO2: 26 mmol/L (ref 22–32)
Calcium: 8.2 mg/dL — ABNORMAL LOW (ref 8.9–10.3)
Chloride: 105 mmol/L (ref 98–111)
Creatinine, Ser: 1.09 mg/dL — ABNORMAL HIGH (ref 0.44–1.00)
GFR calc Af Amer: 60 mL/min — ABNORMAL LOW (ref >60)
GFR calc non Af Amer: 52 mL/min — ABNORMAL LOW (ref >60)
Glucose, Bld: 147 mg/dL — ABNORMAL HIGH (ref 70–99)
Potassium: 4.2 mmol/L (ref 3.5–5.1)
Sodium: 138 mmol/L (ref 135–145)

## 2019-12-16 LAB — SARS CORONAVIRUS 2 (TAT 6-24 HRS): SARS Coronavirus 2: NEGATIVE

## 2019-12-16 MED ORDER — SODIUM CHLORIDE 0.9 % IV SOLN: INTRAVENOUS | Status: DC

## 2019-12-16 MED ORDER — DONEPEZIL HCL 5 MG PO TABS
5.0000 mg | ORAL_TABLET | Freq: Every day | ORAL | Status: DC
  Administered 2019-12-16 – 2019-12-24 (×8): 5 mg via ORAL
  Filled 2019-12-16 (×8): qty 1

## 2019-12-16 MED ORDER — INSULIN REGULAR HUMAN (CONC) 500 UNIT/ML ~~LOC~~ SOPN: 70.0000 [IU] | PEN_INJECTOR | Freq: Three times a day (TID) | SUBCUTANEOUS | Status: DC

## 2019-12-16 MED ORDER — PIPERACILLIN-TAZOBACTAM 3.375 G IVPB
3.3750 g | Freq: Three times a day (TID) | INTRAVENOUS | Status: DC
  Administered 2019-12-16 – 2019-12-20 (×12): 3.375 g via INTRAVENOUS
  Filled 2019-12-16 (×13): qty 50

## 2019-12-16 MED ORDER — HEPARIN SODIUM (PORCINE) 5000 UNIT/ML IJ SOLN
5000.0000 [IU] | Freq: Three times a day (TID) | INTRAMUSCULAR | Status: DC
  Administered 2019-12-16 – 2019-12-18 (×5): 5000 [IU] via SUBCUTANEOUS
  Filled 2019-12-16 (×5): qty 1

## 2019-12-16 MED ORDER — ASPIRIN EC 81 MG PO TBEC
81.0000 mg | DELAYED_RELEASE_TABLET | Freq: Every day | ORAL | Status: DC
  Administered 2019-12-16 – 2019-12-18 (×2): 81 mg via ORAL
  Filled 2019-12-16 (×2): qty 1

## 2019-12-16 MED ORDER — INSULIN REGULAR HUMAN (CONC) 500 UNIT/ML ~~LOC~~ SOPN
50.0000 [IU] | PEN_INJECTOR | Freq: Three times a day (TID) | SUBCUTANEOUS | Status: DC
  Filled 2019-12-16: qty 3

## 2019-12-16 MED ORDER — MORPHINE SULFATE (PF) 2 MG/ML IV SOLN
2.0000 mg | INTRAVENOUS | Status: DC | PRN
  Administered 2019-12-20 (×2): 2 mg via INTRAVENOUS
  Filled 2019-12-16 (×2): qty 1

## 2019-12-16 NOTE — ED Notes (Signed)
Admitting provider at bedside.

## 2019-12-16 NOTE — ED Notes (Signed)
Ready bed @ 1002, patient going to room 127.

## 2019-12-16 NOTE — Progress Notes (Signed)
PROGRESS NOTE    Judith Shaw  [id number] DOB: 1951/06/15 DOA: 12/15/2019 PCP: Kirk Ruths, MD    Brief Narrative:  69 year old female with a history of hypertension, diabetes, chronic diastolic congestive heart failure, presents to the emergency room with left foot wound/diabetic ulcer.  She was found to have underlying osteomyelitis.  Admitted for IV antibiotics and further surgical management.   Assessment & Plan:   Active Problems:   HTN (hypertension)   Diabetes ([place])   Hyperlipidemia   Chronic diastolic CHF (congestive heart failure) ([facility])   Morbid obesity ([facility])   Sleep apnea   Acute osteomyelitis of left foot ([facility])   Dementia without behavioral disturbance ([place])   1. Left foot wound with osteomyelitis.  Currently on intravenous antibiotics.  Podiatry following and plans on left fifth partial ray amputation on 4/23.  She is also being seen by vascular surgery who plans on left lower extremity angiogram on 4/21. 2. Diabetes.  She is chronically on Tresiba 120 units twice daily as well as Humalog for short acting coverage.  She has not received any basal insulin thus far.  Blood sugars have been 75-125 since admission.  Patient was n.p.o. for most of this time.  We will recheck blood sugar in the evening after she has eaten.  If blood sugars are beginning to rise, will be started on basal insulin.  A1c 11.3.  Hold Metformin while in hospital 3. Hypertension.  Continue on carvedilol and ramipril 4. Chronic diastolic congestive heart failure.  Appears compensated at this time.  Will hold further Lasix for now.  Continue on beta-blockers. 5. Hyperlipidemia.  Continue statin 6. Obstructive sleep apnea.  We will continue on CPAP. 7. Morbid obesity.  Would benefit from weight loss. 8. Dementia.  This is a very recent diagnosis.  She was started on Aricept.   DVT prophylaxis: heparin Code Status: full code Family Communication: discussed with patient sister [name] who is  her POA [phone]) Disposition Plan: Status is: Inpatient  Remains inpatient appropriate because:Ongoing diagnostic testing needed not appropriate for outpatient work up.  Patient to have arteriogram on 4/21 to assess vasculature.  Plans are for left fifth partial ray amputation on 4/23.  Continue IV antibiotics   Dispo: The patient is from: Home              Anticipated d/c is to: Home              Anticipated d/c date is: > 3 days              Patient currently is not medically stable to d/c.          Consultants:   Podiatry  Vascular surgery  Procedures:     Antimicrobials:   Vancomycin 4/19 >  Zosyn 4/19 >   Subjective: Patient is sleeping on arrival.  Denies any pain in her feet.  No shortness of breath.  No nausea or vomiting.  Objective: Vitals:   12/16/19 0841 12/16/19 0843 12/16/19 1140 12/16/19 1610  BP: (!) 116/59 (!) 116/59 115/63 (!) 128/54  Pulse: 80 80 75 71  Resp: 18   17  Temp:   98.3 F (36.8 C) 97.8 F (36.6 C)  TempSrc:   Oral Oral  SpO2: 98%  94% 97%  Weight:      Height:        Intake/Output Summary (Last 24 hours) at 12/16/2019 1619 Last data filed at 12/16/2019 1527 Gross per 24 hour  Intake 2887.04 ml  Output --  Net 2887.04 ml   Filed Weights   12/15/19 1724  Weight: 122.5 kg    Examination:  General exam: Appears calm and comfortable  Respiratory system: Clear to auscultation. Respiratory effort normal. Cardiovascular system: S1 & S2 heard, RRR. No JVD, murmurs, rubs, gallops or clicks. No pedal edema. Gastrointestinal system: Abdomen is nondistended, soft and nontender. No organomegaly or masses felt. Normal bowel sounds heard. Central nervous system: Alert and oriented. No focal neurological deficits. Extremities: Symmetric 5 x 5 power. Skin: Ulcer noted on lateral aspect of fifth metatarsal Psychiatry: Judgement and insight appear normal. Mood & affect appropriate.     Data Reviewed: I have personally  reviewed following labs and imaging studies  CBC: Recent Labs  Lab 12/15/19 1733 12/16/19 0436  WBC 7.4 7.4  NEUTROABS 5.2  --   HGB 14.2 13.4  HCT 42.7 39.2  MCV 90.5 88.3  PLT 241 [id number]   Basic Metabolic Panel: Recent Labs  Lab 12/15/19 1733 12/16/19 0436  NA 132* 138  K 4.9 4.2  CL 102 105  CO2 23 26  GLUCOSE 281* 147*  BUN 27* 25*  CREATININE 0.99 1.09*  CALCIUM 8.9 8.2*   GFR: Estimated Creatinine Clearance: 64 mL/min (A) (by C-G formula based on SCr of 1.09 mg/dL (H)). Liver Function Tests: Recent Labs  Lab 12/15/19 1733  AST 22  ALT 25  ALKPHOS 81  BILITOT 0.5  PROT 7.2  ALBUMIN 3.1*   No results for input(s): LIPASE, AMYLASE in the last 168 hours. No results for input(s): AMMONIA in the last 168 hours. Coagulation Profile: No results for input(s): INR, PROTIME in the last 168 hours. Cardiac Enzymes: No results for input(s): CKTOTAL, CKMB, CKMBINDEX, TROPONINI in the last 168 hours. BNP (last 3 results) No results for input(s): PROBNP in the last 8760 hours. HbA1C: Recent Labs    12/15/19 1733  HGBA1C 11.3*   CBG: Recent Labs  Lab 12/16/19 0023 12/16/19 0503 12/16/19 0700 12/16/19 0926 12/16/19 1136  GLUCAP 94 125* 95 85 75   Lipid Profile: No results for input(s): CHOL, HDL, LDLCALC, TRIG, CHOLHDL, LDLDIRECT in the last 72 hours. Thyroid Function Tests: No results for input(s): TSH, T4TOTAL, FREET4, T3FREE, THYROIDAB in the last 72 hours. Anemia Panel: No results for input(s): VITAMINB12, FOLATE, FERRITIN, TIBC, IRON, RETICCTPCT in the last 72 hours. Sepsis Labs: Recent Labs  Lab 12/15/19 1733 12/15/19 2117  LATICACIDVEN 2.1* 2.0*    Recent Results (from the past 240 hour(s))  SARS CORONAVIRUS 2 (TAT 6-24 HRS) Nasopharyngeal Nasopharyngeal Swab     Status: None   Collection Time: 12/15/19  9:57 PM   Specimen: Nasopharyngeal Swab  Result Value Ref Range Status   SARS Coronavirus 2 NEGATIVE NEGATIVE Final    Comment:  (NOTE) SARS-CoV-2 target nucleic acids are NOT DETECTED. The SARS-CoV-2 RNA is generally detectable in upper and lower respiratory specimens during the acute phase of infection. Negative results do not preclude SARS-CoV-2 infection, do not rule out co-infections with other pathogens, and should not be used as the sole basis for treatment or other patient management decisions. Negative results must be combined with clinical observations, patient history, and epidemiological information. The expected result is Negative. Fact Sheet for Patients: SugarRoll.be Fact Sheet for Healthcare Providers: https://www.woods-mathews.com/ This test is not yet approved or cleared by the [place] FDA and  has been authorized for detection and/or diagnosis of SARS-CoV-2 by FDA under an Emergency Use Authorization (EUA). This EUA will remain  in effect (  meaning this test can be used) for the duration of the COVID-19 declaration under Section 56 4(b)(1) of the Act, 21 U.S.C. section 360bbb-3(b)(1), unless the authorization is terminated or revoked sooner. Performed at [facility], [place] [address] [place], [place] [address]          Radiology Studies: DG Foot Complete Left  Result Date: 12/15/2019 CLINICAL DATA:  Left foot ulcer.  Evaluate for osteomyelitis. EXAM: LEFT FOOT - COMPLETE 3+ VIEW COMPARISON:  None. FINDINGS: Soft tissue ulcer at the lateral aspect of the fifth MTP joint. Early bony destruction of the fifth metatarsal head and fifth proximal phalanx base. No acute fracture or dislocation. Joint spaces are preserved. Dorsal midfoot degenerative spurring. Plantar enthesophyte. Bone mineralization is normal. IMPRESSION: 1. Soft tissue ulcer at the lateral aspect of the fifth MTP joint with underlying early osteomyelitis of the fifth metatarsal head and proximal phalanx. Electronically Signed   By: Titus Dubin M.D.   On: 12/15/2019 18:21         Scheduled Meds: . aspirin EC  81 mg Oral Daily  . baclofen  5 mg Oral QHS  . carvedilol  3.125 mg Oral BID WC  . donepezil  5 mg Oral Daily  . DULoxetine  30 mg Oral Daily  . heparin injection (subcutaneous)  5,000 Units Subcutaneous Q8H  . insulin aspart  0-15 Units Subcutaneous Q6H  . mirabegron ER  25 mg Oral Daily  . ramipril  5 mg Oral Daily  . simvastatin  40 mg Oral q morning - 10a   Continuous Infusions: . sodium chloride 100 mL/hr at 12/16/19 1055  . piperacillin-tazobactam (ZOSYN)  IV Stopped (12/16/19 1250)  . vancomycin       LOS: 1 day    Time spent: 17mins    Kathie Dike, MD [facility]   If 7PM-7AM, please contact night-coverage [email or web address]  12/16/2019, 4:19 PM

## 2019-12-16 NOTE — Progress Notes (Signed)
Inpatient Diabetes Program Recommendations  AACE/ADA: New Consensus Statement on Inpatient Glycemic Control (2015)  Target Ranges:  Prepandial:   less than 140 mg/dL      Peak postprandial:   less than 180 mg/dL (1-2 hours)      Critically ill patients:  140 - 180 mg/dL   Results for Judith Shaw, Judith Shaw (MRN [id number]) as of 12/16/2019 10:42  Ref. Range 12/16/2019 00:23 12/16/2019 05:03 12/16/2019 07:00 12/16/2019 09:26  Glucose-Capillary Latest Ref Range: 70 - 99 mg/dL 94 125 (H) 95 85    Admit with:  Left diabetic foot ulcer with severe nonpurulent cellulitis and associated left foot osteomyelitis   History: DM  Home DM Meds: Tresiba 120 units BID       Humalog 60 units TID with meals       Humalog 5 units for every 50 mg/dl above Target CBG of 150 mg/dl       Metformin 500 mg Daily  Current Orders: Concentrated U500 Insulin 50 units TID      Novolog Moderate Correction Scale/ SSI (0-15 units) Q6 hours     MD- Note the Concentrated U500 Insulin currently ordered.  Pt not taking this insulin at home.  See below for Home Insulin regimen.  Please consider:  1. Stop the U500 Insulin  2. Start Lantus 60 units BID today (50% home dose to start)--Hospital does not carry [employer] and spoke w/ Juanda Crumble.  Reviewed the above info.  Pharmacy to send Secure Chat to MD to discuss the above as well.    Endocrinologist: Dr. Lucilla Lame with [facility] seen 11/18/2019--Was told the take the following: Adjust TRESIBA to 120 units twice daily - at approx 9 AM and 5 PM  Continue HUMALOG. This is a fast-acting insulin that should be taken before your meals.  Take 60 units before your meals if your sugar is 80-149 Take 65 units before the meals if your sugar is 150-200 Take 70 units before the meals if your sugar is 201-250 Take 75 units before the meals if your sugar is 251-300 Take 80 units before the meals if your sugar is 301-350 Take 85 units before the meals if your sugar  is 351-400 Take 90 units before the meals if your sugar is over 401  At bedtime and NOT EATING (or if you ate prior and didn't take Humalog for the "snack"), then take some Humalog as well if your sugar is over 200. Take 8 units if your sugar is 201-250 Take 10 units if your sugar is 251-300 Take 12 units if your sugar is 301-350 Take 15 units if your sugar is 351-400 Take 20 units if your sugar is over 401      --Will follow patient during hospitalization--  Wyn Quaker RN, MSN, CDE Diabetes Coordinator Inpatient Glycemic Control Team Team Pager: [phone] (8a-5p)

## 2019-12-16 NOTE — ED Notes (Signed)
Admit bed room changed to 146.

## 2019-12-16 NOTE — Consult Note (Signed)
PODIATRY / FOOT AND ANKLE SURGERY CONSULTATION NOTE  Requesting Physician: Dr. Eugenie Norrie  Reason for consult: Left foot infection  Chief Complaint: Left fifth metatarsal phalangeal joint wound with cellulitis   HPI: Judith Shaw is a 69 y.o. female who presents with an ulceration to the left fifth metatarsal phalangeal joint.  Patient has been treated in the outpatient clinic setting with multiple wounds to the left foot including the big toe, second toe, third toe, fourth toe and now the fifth metatarsal phalangeal joint.  Patient manipulates the wound and her caretaker also has manipulating the wounds and could have potentially caused some of the wounds on the feet by removing some dry skin present.  Patient was seen in clinic yesterday and was noted to have increased redness and swelling to the left foot with purulent type drainage coming from the wound and the redness appeared to be streaking up the leg to the ankle joint area.  Patient was subsequently sent to the emergency room for admission.  Patient was admitted to the hospital for further work-up.  X-ray was taken showing osteomyelitic changes to the left fifth metatarsal phalangeal joint area.  Patient is type II diabetic and is uncontrolled and her last hemoglobin A1c was around 11%.  Patient has multiple comorbidities and issues.  ABIs were performed in the past within the last month or 2 which showed triphasic waveforms and no evidence of peripheral vascular disease.  Patient presents with caretaker today.  Patient states she has a mild amount of pain to the left foot but overall has mostly numbness and tingling.  Patient sister Judith Shaw is close to being the power of attorney as the patient does not have decision-making capacity due to dementia.  PMHx:  Past Medical History:  Diagnosis Date  . Anxiety   . Cancer ([place])    pt states hx of uterine cancer and had a complete hyst  . CHF (congestive heart failure) ([place])   . Depression   .  Diabetes mellitus without complication ([place])   . Hyperlipidemia   . Hypertension     Surgical Hx:  Past Surgical History:  Procedure Laterality Date  . ABDOMINAL HYSTERECTOMY    . CHOLECYSTECTOMY      FHx:  Family History  Problem Relation Age of Onset  . Hypertension Mother   . Heart disease Father   . Prostate cancer Brother   . Diabetes Maternal Aunt   . Kidney cancer Neg Hx   . Bladder Cancer Neg Hx     Social History:  reports that she has never smoked. She has never used smokeless tobacco. She reports that she does not drink alcohol or use drugs.  Allergies:  Allergies  Allergen Reactions  . Codeine     Review of Systems: General ROS: negative Psychological ROS: negative Respiratory ROS: no cough, shortness of breath, or wheezing Cardiovascular ROS: no chest pain or dyspnea on exertion Musculoskeletal ROS: positive for - joint pain and joint swelling Neurological ROS: positive for - numbness/tingling Dermatological ROS: positive for Left fifth toe, fourth toe ulcerations  Medications Prior to Admission  Medication Sig Dispense Refill  . acetaminophen (TYLENOL) 500 MG tablet Take 500 mg by mouth every 6 (six) hours as needed.    Marland Kitchen aspirin EC 81 MG tablet Take 81 mg by mouth daily.    . Baclofen 5 MG TABS Take 5 mg by mouth at bedtime. 5 tablet 0  . carvedilol (COREG) 3.125 MG tablet Take 3.125 mg by mouth  2 (two) times daily with a meal.    . clotrimazole-betamethasone (LOTRISONE) cream Apply 1 application topically 2 (two) times daily.    Marland Kitchen donepezil (ARICEPT) 5 MG tablet Take 5 mg by mouth daily.    . DULoxetine HCl 60 MG CSDR Take 60 mg by mouth daily.     . furosemide (LASIX) 20 MG tablet Take 20 mg by mouth every other day.     Marland Kitchen gentian violet 2 % topical solution Apply 0.5 mLs topically in the morning and at bedtime. Apply between the toes twice daily.    . insulin lispro (HUMALOG) 100 UNIT/ML KwikPen Inject 200 Units into the skin See admin  instructions.    Marland Kitchen ketoconazole (NIZORAL) 2 % cream Apply 1 application topically in the morning and at bedtime.    . meloxicam (MOBIC) 7.5 MG tablet Take 1 tablet (7.5 mg total) by mouth daily. 10 tablet 0  . metFORMIN (GLUCOPHAGE-XR) 500 MG 24 hr tablet Take 500 mg by mouth daily. With dinner.    . mirabegron ER (MYRBETRIQ) 25 MG TB24 tablet Take 1 tablet (25 mg total) by mouth daily. 30 tablet 0  . potassium chloride (K-DUR,KLOR-CON) 10 MEQ tablet Take 10 mEq by mouth daily.    . ramipril (ALTACE) 5 MG capsule Take 5 mg by mouth daily.    Marland Kitchen SANTYL ointment Apply 1 application topically daily.    . simvastatin (ZOCOR) 40 MG tablet Take 40 mg by mouth every morning.     Marland Kitchen terconazole (TERAZOL 7) 0.4 % vaginal cream Place 1 applicator vaginally at bedtime.    Tyler Aas FLEXTOUCH 200 UNIT/ML FlexTouch Pen Inject 120 Units into the skin in the morning and at bedtime.    . triamcinolone lotion (KENALOG) 0.1 % Apply 1 application topically 3 (three) times daily.      Physical Exam: General: Alert and oriented.  No apparent distress. Vascular: DP/PT pulses nonpalpable to bilateral lower extremities.  Capillary fill time appears to be mildly delayed to the digits of the left foot.  No hair growth present to bilateral lower extremities.  Erythema and edema present to the left foot over the fifth metatarsal phalangeal joint with extension proximally to the midfoot level.  Neuro: Light touch sensation diminished to bilateral lower extremities.  Derm: Ulceration present to the left fifth metatarsal phalangeal joint laterally with fibronecrotic wound base and bone exposed at the fifth metatarsal phalangeal joint level, serous sanguinous drainage present today, no purulence, mild odor, no palpable abscess, no fluctuance, associated erythema and edema present around the periphery of the wound extending proximally to the midfoot.  Ulceration present to the left lateral PIPJ fourth toe, measures  approximately 0.2 x 0.2 x 0.3 cm and goes close to bone to the level of tendon, no associated erythema and edema present to this level, no drainage, no odor  Skin appears to be thin and atrophic to bilateral lower extremities.  Nails are thickened and discolored subungual debris x10 to both feet.  MSK: Hammertoe contractures digits 2 through 5 bilateral.  Limited ankle joint dorsiflexion with knee extended but improved knee flexion bilateral.  Results for orders placed or performed during the hospital encounter of 12/15/19 (from the past 48 hour(s))  CBC with Differential     Status: None   Collection Time: 12/15/19  5:33 PM  Result Value Ref Range   WBC 7.4 4.0 - 10.5 K/uL   RBC 4.72 3.87 - 5.11 MIL/uL   Hemoglobin 14.2 12.0 - 15.0 g/dL  HCT 42.7 36.0 - 46.0 %   MCV 90.5 80.0 - 100.0 fL   MCH 30.1 26.0 - 34.0 pg   MCHC 33.3 30.0 - 36.0 g/dL   RDW 12.4 11.5 - 15.5 %   Platelets 241 150 - 400 K/uL   nRBC 0.0 0.0 - 0.2 %   Neutrophils Relative % 70 %   Neutro Abs 5.2 1.7 - 7.7 K/uL   Lymphocytes Relative 21 %   Lymphs Abs 1.6 0.7 - 4.0 K/uL   Monocytes Relative 7 %   Monocytes Absolute 0.5 0.1 - 1.0 K/uL   Eosinophils Relative 2 %   Eosinophils Absolute 0.1 0.0 - 0.5 K/uL   Basophils Relative 0 %   Basophils Absolute 0.0 0.0 - 0.1 K/uL   Immature Granulocytes 0 %   Abs Immature Granulocytes 0.03 0.00 - 0.07 K/uL    Comment: Performed at [facility], [address] [place], [place] [address]  Comprehensive metabolic panel     Status: Abnormal   Collection Time: 12/15/19  5:33 PM  Result Value Ref Range   Sodium 132 (L) 135 - 145 mmol/L   Potassium 4.9 3.5 - 5.1 mmol/L   Chloride 102 98 - 111 mmol/L   CO2 23 22 - 32 mmol/L   Glucose, Bld 281 (H) 70 - 99 mg/dL    Comment: Glucose reference range applies only to samples taken after fasting for at least 8 hours.   BUN 27 (H) 8 - 23 mg/dL   Creatinine, Ser 0.99 0.44 - 1.00 mg/dL   Calcium 8.9 8.9 - 10.3 mg/dL   Total  Protein 7.2 6.5 - 8.1 g/dL   Albumin 3.1 (L) 3.5 - 5.0 g/dL   AST 22 15 - 41 U/L   ALT 25 0 - 44 U/L   Alkaline Phosphatase 81 38 - 126 U/L   Total Bilirubin 0.5 0.3 - 1.2 mg/dL   GFR calc non Af Amer 58 (L) >60 mL/min   GFR calc Af Amer >60 >60 mL/min   Anion gap 7 5 - 15    Comment: Performed at [facility], [address] [place], [place] [address]  Lactic acid, plasma     Status: Abnormal   Collection Time: 12/15/19  5:33 PM  Result Value Ref Range   Lactic Acid, Venous 2.1 (HH) 0.5 - 1.9 mmol/L    Comment: CRITICAL RESULT CALLED TO, READ BACK BY AND VERIFIED WITH ANNA HOLT, RN AT [phone] ON 12/15/19 SNG Performed at [facility], [address] [place], [place] [address]   Hemoglobin A1c     Status: Abnormal   Collection Time: 12/15/19  5:33 PM  Result Value Ref Range   Hgb A1c MFr Bld 11.3 (H) 4.8 - 5.6 %    Comment: (NOTE) Pre diabetes:          5.7%-6.4% Diabetes:              >6.4% Glycemic control for   <7.0% adults with diabetes    Mean Plasma Glucose 277.61 mg/dL    Comment: Performed at [facility], [place] [address] [place], [place] [address]  Lactic acid, plasma     Status: Abnormal   Collection Time: 12/15/19  9:17 PM  Result Value Ref Range   Lactic Acid, Venous 2.0 (HH) 0.5 - 1.9 mmol/L    Comment: CRITICAL RESULT CALLED TO, READ BACK BY AND VERIFIED WITH [facility] BARKER AT [phone] ON 12/15/19 RWW Performed at [facility], [address] [place], [place] [address]   SARS CORONAVIRUS 2 (TAT 6-24 HRS) Nasopharyngeal Nasopharyngeal Swab     Status: None   Collection Time: 12/15/19  9:57 PM   Specimen: Nasopharyngeal Swab  Result Value Ref Range   SARS Coronavirus 2 NEGATIVE NEGATIVE    Comment: (NOTE) SARS-CoV-2 target nucleic acids are NOT DETECTED. The SARS-CoV-2 RNA is generally detectable in upper and lower respiratory specimens during the acute phase of infection. Negative results do not preclude SARS-CoV-2 infection, do  not rule out co-infections with other pathogens, and should not be used as the sole basis for treatment or other patient management decisions. Negative results must be combined with clinical observations, patient history, and epidemiological information. The expected result is Negative. Fact Sheet for Patients: SugarRoll.be Fact Sheet for Healthcare Providers: https://www.woods-mathews.com/ This test is not yet approved or cleared by the [place] FDA and  has been authorized for detection and/or diagnosis of SARS-CoV-2 by FDA under an Emergency Use Authorization (EUA). This EUA will remain  in effect (meaning this test can be used) for the duration of the COVID-19 declaration under Section 56 4(b)(1) of the Act, 21 U.S.C. section 360bbb-3(b)(1), unless the authorization is terminated or revoked sooner. Performed at [facility], [place] [address] [place], [place] [address]   Glucose, capillary     Status: None   Collection Time: 12/16/19 12:23 AM  Result Value Ref Range   Glucose-Capillary 94 70 - 99 mg/dL    Comment: Glucose reference range applies only to samples taken after fasting for at least 8 hours.  Basic metabolic panel     Status: Abnormal   Collection Time: 12/16/19  4:36 AM  Result Value Ref Range   Sodium 138 135 - 145 mmol/L   Potassium 4.2 3.5 - 5.1 mmol/L   Chloride 105 98 - 111 mmol/L   CO2 26 22 - 32 mmol/L   Glucose, Bld 147 (H) 70 - 99 mg/dL    Comment: Glucose reference range applies only to samples taken after fasting for at least 8 hours.   BUN 25 (H) 8 - 23 mg/dL   Creatinine, Ser 1.09 (H) 0.44 - 1.00 mg/dL   Calcium 8.2 (L) 8.9 - 10.3 mg/dL   GFR calc non Af Amer 52 (L) >60 mL/min   GFR calc Af Amer 60 (L) >60 mL/min   Anion gap 7 5 - 15    Comment: Performed at [facility], [address] [place], [place] [address]  CBC     Status: None   Collection Time: 12/16/19  4:36 AM  Result Value  Ref Range   WBC 7.4 4.0 - 10.5 K/uL   RBC 4.44 3.87 - 5.11 MIL/uL   Hemoglobin 13.4 12.0 - 15.0 g/dL   HCT 39.2 36.0 - 46.0 %   MCV 88.3 80.0 - 100.0 fL   MCH 30.2 26.0 - 34.0 pg   MCHC 34.2 30.0 - 36.0 g/dL   RDW 12.7 11.5 - 15.5 %   Platelets 225 150 - 400 K/uL   nRBC 0.0 0.0 - 0.2 %    Comment: Performed at [facility], [address] [place], [place] [address]  Glucose, capillary     Status: Abnormal   Collection Time: 12/16/19  5:03 AM  Result Value Ref Range   Glucose-Capillary 125 (H) 70 - 99 mg/dL    Comment: Glucose reference range applies only to samples taken after fasting for at least 8 hours.  Glucose, capillary  Status: None   Collection Time: 12/16/19  7:00 AM  Result Value Ref Range   Glucose-Capillary 95 70 - 99 mg/dL    Comment: Glucose reference range applies only to samples taken after fasting for at least 8 hours.  Glucose, capillary     Status: None   Collection Time: 12/16/19  9:26 AM  Result Value Ref Range   Glucose-Capillary 85 70 - 99 mg/dL    Comment: Glucose reference range applies only to samples taken after fasting for at least 8 hours.  Glucose, capillary     Status: None   Collection Time: 12/16/19 11:36 AM  Result Value Ref Range   Glucose-Capillary 75 70 - 99 mg/dL    Comment: Glucose reference range applies only to samples taken after fasting for at least 8 hours.   DG Foot Complete Left  Result Date: 12/15/2019 CLINICAL DATA:  Left foot ulcer.  Evaluate for osteomyelitis. EXAM: LEFT FOOT - COMPLETE 3+ VIEW COMPARISON:  None. FINDINGS: Soft tissue ulcer at the lateral aspect of the fifth MTP joint. Early bony destruction of the fifth metatarsal head and fifth proximal phalanx base. No acute fracture or dislocation. Joint spaces are preserved. Dorsal midfoot degenerative spurring. Plantar enthesophyte. Bone mineralization is normal. IMPRESSION: 1. Soft tissue ulcer at the lateral aspect of the fifth MTP joint with underlying early  osteomyelitis of the fifth metatarsal head and proximal phalanx. Electronically Signed   By: Titus Dubin M.D.   On: 12/15/2019 18:21    Blood pressure 115/63, pulse 75, temperature 98.3 F (36.8 C), temperature source Oral, resp. rate 18, height 5\' 5"  (1.651 m), weight 122.5 kg, SpO2 94 %.  Assessment 1. Osteomyelitis left fifth metatarsal phalangeal joint with associated cellulitis and overlying wound with necrosis of bone to the fifth MPJ 2. Left fourth toe ulceration, depth to tendon at the PIPJ 3. Type 2 diabetes, uncontrolled 4. PVD 5. Dementia  Plan -Patient seen and examined. -Ulceration to the left fifth metatarsal phalangeal joint probes to bone at the fifth metatarsal phalangeal joint area has capsule and bone appears to be exposed through the wound with fibronecrotic changes to the wound base.  Erythema and edema present around the periphery of the wound with extension proximally over the midfoot. -Ulceration to the left fourth toe appears to probe to tendon but still has coverage over bone and no bone is palpable, no associated signs of infection to this area. -X-ray imaging reviewed and discussed with patient and caretaker in detail showing osteomyelitic changes to the left fifth metatarsal phalangeal joint. -Appreciate vascular recommendations.  Plan for angiogram tomorrow to assess blood flow further. -We will plan for procedure on the left foot on Friday consisting of left fifth partial ray amputation.  Discussed all treatment options with the patient both conservative and surgical attempts at correction: Potential risks and complications at this time patient is elected for surgery consisting of left partial fifth ray amputation.  Discussed this with power of attorney, Judith Shaw, patient's sister as well and is agreeable. -Discussed treatment with [name] in detail.  Patient sister is concerned due to patient's comorbidities and would potentially like to have the patient transferred  as she knows the [place] area better.  Reassured family that these conditions that the patient has are relatively common in this area and have been treated for in the past with routine care.  Gave patient sister the hospitalist information to talk about this further. -Orders placed for dressings daily.  Order also placed for wound  culture left foot.  To be collected. -Appreciate recommendations for antibiotics per medicine.  We will take intraoperative cultures well as well as bone cultures. -Patient can maintain partial weightbearing with surgical shoe on heel contact at all times.  PT/OT to be placed after surgical procedure.  Caroline More, DPM 12/16/2019, 3:23 PM

## 2019-12-16 NOTE — Consult Note (Signed)
[facility] Vascular Consult Note  MRN : [id number]  Judith Shaw is a 69 y.o. ([birth year]) female who presents with chief complaint of left foot wound.  History of Present Illness:  The patient is a 69 year old female with multiple medical issues (see below) who presented to the [facility] emergency department with a chief complaint of left foot wound.  The patient endorses a left foot wound, recently seen by her podiatrist.  Patient notes progressively worsening left foot pain.  Describes the pain as intermittent however progressively worsening.  States worsening erythema. Patient underwent x-ray which was notable for possible osteomyelitis.  She denies any fever, nausea vomiting.  Denies any shortness of breath or chest pain.  Patient will be undergoing surgery with podiatry later this week.  On 10/31/19: ABI:   Bilateral: Triphasic blood flow  Vascular surgery was consulted by Dr. Luana Shu for possible endovascular intervention.  Current Facility-Administered Medications  Medication Dose Route Frequency Provider Last Rate Last Admin  . 0.9 %  sodium chloride infusion   Intravenous Continuous Mansy, Jan A, MD 100 mL/hr at 12/16/19 1055 New Bag at 12/16/19 1055  . acetaminophen (TYLENOL) tablet 650 mg  650 mg Oral Q6H PRN Mansy, Jan A, MD       Or  . acetaminophen (TYLENOL) suppository 650 mg  650 mg Rectal Q6H PRN Mansy, Jan A, MD      . baclofen (LIORESAL) tablet 5 mg  5 mg Oral QHS Mansy, Jan A, MD   5 mg at 12/16/19 0112  . carvedilol (COREG) tablet 3.125 mg  3.125 mg Oral BID WC Mansy, Jan A, MD   3.125 mg at 12/16/19 0843  . DULoxetine (CYMBALTA) DR capsule 30 mg  30 mg Oral Daily Mansy, Jan A, MD   30 mg at 12/16/19 [id number]  . insulin aspart (novoLOG) injection 0-15 Units  0-15 Units Subcutaneous Q6H Mansy, Arvella Merles, MD   2 Units at 12/16/19 0513  . magnesium hydroxide (MILK OF MAGNESIA) suspension 30 mL  30 mL Oral Daily PRN Mansy, Jan A, MD       . mirabegron ER [facility]) tablet 25 mg  25 mg Oral Daily Mansy, Jan A, MD   25 mg at 12/16/19 [id number]  . morphine 2 MG/ML injection 2 mg  2 mg Intravenous Q4H PRN Mansy, Jan A, MD      . ondansetron [facility]) tablet 4 mg  4 mg Oral Q6H PRN Mansy, Jan A, MD       Or  . ondansetron [facility]) injection 4 mg  4 mg Intravenous Q6H PRN Mansy, Jan A, MD      . piperacillin-tazobactam (ZOSYN) IVPB 3.375 g  3.375 g Intravenous Q8H Mansy, Jan A, MD 12.5 mL/hr at 12/16/19 0850 3.375 g at 12/16/19 0850  . ramipril (ALTACE) capsule 5 mg  5 mg Oral Daily Mansy, Arvella Merles, MD   Stopped at 12/16/19 1202  . simvastatin (ZOCOR) tablet 40 mg  40 mg Oral q morning - 10a Mansy, Jan A, MD      . traZODone (DESYREL) tablet 25 mg  25 mg Oral QHS PRN Mansy, Jan A, MD      . vancomycin (VANCOREADY) IVPB 1250 mg/250 mL  1,250 mg Intravenous Q24H Mansy, Arvella Merles, MD       Past Medical History:  Diagnosis Date  . Anxiety   . Cancer ([place])    pt states hx of uterine cancer and had a complete hyst  .  CHF (congestive heart failure) ([place])   . Depression   . Diabetes mellitus without complication ([place])   . Hyperlipidemia   . Hypertension    Past Surgical History:  Procedure Laterality Date  . ABDOMINAL HYSTERECTOMY    . CHOLECYSTECTOMY     Social History Social History   Tobacco Use  . Smoking status: Never Smoker  . Smokeless tobacco: Never Used  Substance Use Topics  . Alcohol use: No  . Drug use: No   Family History Family History  Problem Relation Age of Onset  . Hypertension Mother   . Heart disease Father   . Prostate cancer Brother   . Diabetes Maternal Aunt   . Kidney cancer Neg Hx   . Bladder Cancer Neg Hx   Denies family history of peripheral artery disease, venous disease or bleeding/clotting disorders.  Allergies  Allergen Reactions  . Codeine    REVIEW OF SYSTEMS (Negative unless checked)  Constitutional: [] Weight loss  [] Fever  [] Chills Cardiac: [] Chest pain   [] Chest pressure    [] Palpitations   [] Shortness of breath when laying flat   [] Shortness of breath at rest   [] Shortness of breath with exertion. Vascular:  [] Pain in legs with walking   [] Pain in legs at rest   [] Pain in legs when laying flat   [] Claudication   [x] Pain in feet when walking  [x] Pain in feet at rest  [x] Pain in feet when laying flat   [] History of DVT   [] Phlebitis   [] Swelling in legs   [] Varicose veins   [] Non-healing ulcers Pulmonary:   [] Uses home oxygen   [] Productive cough   [] Hemoptysis   [] Wheeze  [] COPD   [] Asthma Neurologic:  [] Dizziness  [] Blackouts   [] Seizures   [] History of stroke   [] History of TIA  [] Aphasia   [] Temporary blindness   [] Dysphagia   [] Weakness or numbness in arms   [] Weakness or numbness in legs Musculoskeletal:  [] Arthritis   [] Joint swelling   [] Joint pain   [] Low back pain Hematologic:  [] Easy bruising  [] Easy bleeding   [] Hypercoagulable state   [] Anemic  [] Hepatitis Gastrointestinal:  [] Blood in stool   [] Vomiting blood  [] Gastroesophageal reflux/heartburn   [] Difficulty swallowing. Genitourinary:  [] Chronic kidney disease   [] Difficult urination  [] Frequent urination  [] Burning with urination   [] Blood in urine Skin:  [] Rashes   [x] Ulcers   [x] Wounds Psychological:  [] History of anxiety   []  History of major depression.  Physical Examination  Vitals:   12/16/19 0636 12/16/19 0841 12/16/19 0843 12/16/19 1140  BP: 99/75 (!) 116/59 (!) 116/59 115/63  Pulse: 74 80 80 75  Resp: 18 18    Temp:    98.3 F (36.8 C)  TempSrc:    Oral  SpO2: 99% 98%  94%  Weight:      Height:       Body mass index is 44.93 kg/m. Gen:  WD/WN, NAD Head: [place]/AT, No temporalis wasting. Prominent temp pulse not noted. Ear/Nose/Throat: Hearing grossly intact, nares w/o erythema or drainage, oropharynx w/o Erythema/Exudate Eyes: Sclera non-icteric, conjunctiva clear Neck: Trachea midline.  No JVD.  Pulmonary:  Good air movement, respirations not labored, equal bilaterally.  Cardiac:  RRR, normal S1, S2. Vascular:  Vessel Right Left  Radial Palpable Palpable  Ulnar Palpable Palpable  Brachial Palpable Palpable  Carotid Palpable, without bruit Palpable, without bruit  Aorta Not palpable N/A  Femoral Palpable Palpable  Popliteal Palpable Palpable  PT Non-Palpable Non-Palpable  DP Non-Palpable Non-Palpable   Left  Lower Extremity: Thigh soft.  Calf soft.  Extremities warm distally to toes however hard to palpate pedal pulses.  Ulcer noted to lateral aspect of foot.  Surrounding erythema.  Motor/sensory intact.   Document Information Photos    12/16/2019 00:10  Attached To:  Hospital Encounter on 12/15/19  Source Information Mansy, Arvella Merles, MD  [facility]    Gastrointestinal: soft, non-tender/non-distended. No guarding/reflex.  Musculoskeletal: M/S 5/5 throughout.  Extremities without ischemic changes.  No deformity or atrophy. No edema. Neurologic: Sensation grossly intact in extremities.  Symmetrical.  Speech is fluent. Motor exam as listed above. Psychiatric: Judgment intact, Mood & affect appropriate for pt's clinical situation. Dermatologic: As above Lymph : No Cervical, Axillary, or Inguinal lymphadenopathy.  CBC Lab Results  Component Value Date   WBC 7.4 12/16/2019   HGB 13.4 12/16/2019   HCT 39.2 12/16/2019   MCV 88.3 12/16/2019   PLT 225 12/16/2019   BMET    Component Value Date/Time   NA 138 12/16/2019 0436   NA 141 05/25/2012 0016   K 4.2 12/16/2019 0436   K 4.2 05/25/2012 0016   CL 105 12/16/2019 0436   CL 105 05/25/2012 0016   CO2 26 12/16/2019 0436   CO2 28 05/25/2012 0016   GLUCOSE 147 (H) 12/16/2019 0436   GLUCOSE 126 (H) 05/25/2012 0016   BUN 25 (H) 12/16/2019 0436   BUN 14 05/25/2012 0016   CREATININE 1.09 (H) 12/16/2019 0436   CREATININE 1.16 05/25/2012 0016   CALCIUM 8.2 (L) 12/16/2019 0436   CALCIUM 9.0 05/25/2012 0016   GFRNONAA 52 (L) 12/16/2019 0436   GFRNONAA 50 (L) 05/25/2012 0016   GFRAA 60 (L)  12/16/2019 0436   GFRAA 58 (L) 05/25/2012 0016   Estimated Creatinine Clearance: 64 mL/min (A) (by C-G formula based on SCr of 1.09 mg/dL (H)).  COAG Lab Results  Component Value Date   INR 1.2 05/21/2012   Radiology DG Foot Complete Left  Result Date: 12/15/2019 CLINICAL DATA:  Left foot ulcer.  Evaluate for osteomyelitis. EXAM: LEFT FOOT - COMPLETE 3+ VIEW COMPARISON:  None. FINDINGS: Soft tissue ulcer at the lateral aspect of the fifth MTP joint. Early bony destruction of the fifth metatarsal head and fifth proximal phalanx base. No acute fracture or dislocation. Joint spaces are preserved. Dorsal midfoot degenerative spurring. Plantar enthesophyte. Bone mineralization is normal. IMPRESSION: 1. Soft tissue ulcer at the lateral aspect of the fifth MTP joint with underlying early osteomyelitis of the fifth metatarsal head and proximal phalanx. Electronically Signed   By: Titus Dubin M.D.   On: 12/15/2019 18:21   Assessment/Plan The patient is a 69 year old female with multiple medical issues (see below) who presented to the [facility] emergency department with a chief complaint of left foot wound.  1.  Left foot wound / possible osteomyelitis: Patient presents with progressively worsening chronic wound to the left lateral aspect of the foot.  Possible osteomyelitis on x-ray.  Patient underwent ABIs on October 31, 2019 - found to have triphasic tibials.  Hard to palpate pedal pulses on exam.  Poorly controlled diabetic.  In the setting of chronic wound with possible osteomyelitis and hard to palpate pedal pulses recommend undergoing a left lower extremity angiogram with possible intervention to assess the patient's anatomy and contributing degree of atherosclerotic disease.  If appropriate an attempt to revascularize the leg came made at that time.  Procedure, risks and benefits explained to the patient.  All questions answered.  The patient wishes  to proceed.  We will  plan on this tomorrow.  2.  Diabetes: Poorly controlled. Encouraged good control as its slows the progression of atherosclerotic disease  3.  Hyperlipidemia: On aspirin and statin for medical management. Encouraged good control as its slows the progression of atherosclerotic disease  Discussed with Dr. Mayme Genta, PA-C  12/16/2019 12:40 PM  This note was created with Dragon medical transcription system.  Any error is purely unintentional

## 2019-12-16 NOTE — ED Notes (Signed)
Pt appears to be sleeping at this time. Not disturbed for assessment to promote rest and recovery. Respirations appear even, unlabored, and symmetrical. Continuous O2 monitoring in place. No signs of distress noted at this time.

## 2019-12-17 ENCOUNTER — Ambulatory Visit: Payer: Medicare PPO

## 2019-12-17 ENCOUNTER — Encounter
Admission: EM | Disposition: A | Discharge status: Skilled Nursing Facility | Payer: Self-pay | Source: Home / Self Care | Attending: Student

## 2019-12-17 DIAGNOSIS — M86172 Other acute osteomyelitis, left ankle and foot: Secondary | ICD-10-CM | POA: Diagnosis not present

## 2019-12-17 DIAGNOSIS — F0390 Unspecified dementia without behavioral disturbance: Secondary | ICD-10-CM | POA: Diagnosis not present

## 2019-12-17 DIAGNOSIS — E119 Type 2 diabetes mellitus without complications: Secondary | ICD-10-CM

## 2019-12-17 DIAGNOSIS — Z794 Long term (current) use of insulin: Secondary | ICD-10-CM

## 2019-12-17 DIAGNOSIS — I70244 Atherosclerosis of native arteries of left leg with ulceration of heel and midfoot: Secondary | ICD-10-CM

## 2019-12-17 DIAGNOSIS — I5032 Chronic diastolic (congestive) heart failure: Secondary | ICD-10-CM | POA: Diagnosis not present

## 2019-12-17 HISTORY — PX: LOWER EXTREMITY ANGIOGRAPHY: CATH118251

## 2019-12-17 LAB — GLUCOSE, CAPILLARY
Glucose-Capillary: 196 mg/dL — ABNORMAL HIGH (ref 70–99)
Glucose-Capillary: 224 mg/dL — ABNORMAL HIGH (ref 70–99)
Glucose-Capillary: 186 mg/dL — ABNORMAL HIGH (ref 70–99)
Glucose-Capillary: 208 mg/dL — ABNORMAL HIGH (ref 70–99)
Glucose-Capillary: 247 mg/dL — ABNORMAL HIGH (ref 70–99)
Glucose-Capillary: 309 mg/dL — ABNORMAL HIGH (ref 70–99)

## 2019-12-17 LAB — BASIC METABOLIC PANEL
Anion gap: 7 (ref 5–15)
BUN: 20 mg/dL (ref 8–23)
CO2: 25 mmol/L (ref 22–32)
Calcium: 8.3 mg/dL — ABNORMAL LOW (ref 8.9–10.3)
Chloride: 106 mmol/L (ref 98–111)
Creatinine, Ser: 0.99 mg/dL (ref 0.44–1.00)
GFR calc Af Amer: >60 mL/min (ref >60)
GFR calc non Af Amer: 58 mL/min — ABNORMAL LOW (ref >60)
Glucose, Bld: 231 mg/dL — ABNORMAL HIGH (ref 70–99)
Potassium: 4.7 mmol/L (ref 3.5–5.1)
Sodium: 138 mmol/L (ref 135–145)

## 2019-12-17 LAB — SURGICAL PCR SCREEN
MRSA, PCR: NEGATIVE
Staphylococcus aureus: POSITIVE — AB

## 2019-12-17 SURGERY — LOWER EXTREMITY ANGIOGRAPHY
Anesthesia: Moderate Sedation | Laterality: Left

## 2019-12-17 MED ORDER — CEFAZOLIN SODIUM-DEXTROSE 2-4 GM/100ML-% IV SOLN
2.0000 g | Freq: Once | INTRAVENOUS | Status: DC
  Administered 2019-12-17: 2 g via INTRAVENOUS

## 2019-12-17 MED ORDER — FENTANYL CITRATE (PF) 100 MCG/2ML IJ SOLN
INTRAMUSCULAR | Status: DC | PRN
  Administered 2019-12-17 (×2): 50 ug via INTRAVENOUS

## 2019-12-17 MED ORDER — CEFAZOLIN SODIUM-DEXTROSE 2-4 GM/100ML-% IV SOLN
INTRAVENOUS | Status: AC
  Filled 2019-12-17: qty 100

## 2019-12-17 MED ORDER — MUPIROCIN 2 % EX OINT
1.0000 "application " | TOPICAL_OINTMENT | Freq: Two times a day (BID) | CUTANEOUS | Status: AC
  Administered 2019-12-17 – 2019-12-22 (×10): 1 via NASAL
  Filled 2019-12-17: qty 22

## 2019-12-17 MED ORDER — MIDAZOLAM HCL 5 MG/5ML IJ SOLN
INTRAMUSCULAR | Status: AC
  Filled 2019-12-17: qty 5

## 2019-12-17 MED ORDER — HYDROMORPHONE HCL 1 MG/ML IJ SOLN
1.0000 mg | Freq: Once | INTRAMUSCULAR | Status: AC | PRN
  Administered 2019-12-19: 1 mg via INTRAVENOUS
  Filled 2019-12-17: qty 1

## 2019-12-17 MED ORDER — INSULIN GLARGINE 100 UNIT/ML ~~LOC~~ SOLN
60.0000 [IU] | Freq: Two times a day (BID) | SUBCUTANEOUS | Status: DC
  Administered 2019-12-17 – 2019-12-23 (×12): 60 [IU] via SUBCUTANEOUS
  Filled 2019-12-17 (×16): qty 0.6

## 2019-12-17 MED ORDER — INSULIN ASPART 100 UNIT/ML ~~LOC~~ SOLN
SUBCUTANEOUS | Status: AC
  Administered 2019-12-17: 5 [IU] via SUBCUTANEOUS
  Filled 2019-12-17: qty 1

## 2019-12-17 MED ORDER — MIDAZOLAM HCL 2 MG/2ML IJ SOLN
INTRAMUSCULAR | Status: DC | PRN
  Administered 2019-12-17: 2 mg via INTRAVENOUS
  Administered 2019-12-17: 1 mg via INTRAVENOUS

## 2019-12-17 MED ORDER — SODIUM CHLORIDE 0.9 % IV SOLN: INTRAVENOUS | Status: DC

## 2019-12-17 MED ORDER — CHLORHEXIDINE GLUCONATE 4 % EX LIQD
60.0000 mL | Freq: Once | CUTANEOUS | Status: AC
  Administered 2019-12-19: 4 via TOPICAL

## 2019-12-17 MED ORDER — METHYLPREDNISOLONE SODIUM SUCC 125 MG IJ SOLR: 125.0000 mg | Freq: Once | INTRAMUSCULAR | Status: DC | PRN

## 2019-12-17 MED ORDER — HEPARIN SODIUM (PORCINE) 1000 UNIT/ML IJ SOLN
INTRAMUSCULAR | Status: DC | PRN
  Administered 2019-12-17: 4000 [IU] via INTRAVENOUS

## 2019-12-17 MED ORDER — IODIXANOL 320 MG/ML IV SOLN
INTRAVENOUS | Status: DC | PRN
  Administered 2019-12-17: 55 mL

## 2019-12-17 MED ORDER — MIDAZOLAM HCL 2 MG/ML PO SYRP: 8.0000 mg | ORAL_SOLUTION | Freq: Once | ORAL | Status: DC | PRN

## 2019-12-17 MED ORDER — DIPHENHYDRAMINE HCL 50 MG/ML IJ SOLN: 50.0000 mg | Freq: Once | INTRAMUSCULAR | Status: DC | PRN

## 2019-12-17 MED ORDER — FAMOTIDINE 20 MG PO TABS: 40.0000 mg | ORAL_TABLET | Freq: Once | ORAL | Status: DC | PRN

## 2019-12-17 MED ORDER — HEPARIN SODIUM (PORCINE) 1000 UNIT/ML IJ SOLN
INTRAMUSCULAR | Status: AC
  Filled 2019-12-17: qty 1

## 2019-12-17 MED ORDER — FENTANYL CITRATE (PF) 100 MCG/2ML IJ SOLN
INTRAMUSCULAR | Status: AC
  Filled 2019-12-17: qty 2

## 2019-12-17 MED ORDER — ONDANSETRON HCL 4 MG/2ML IJ SOLN: 4.0000 mg | Freq: Four times a day (QID) | INTRAMUSCULAR | Status: DC | PRN

## 2019-12-17 MED ORDER — POVIDONE-IODINE 10 % EX SWAB: 2.0000 "application " | Freq: Once | CUTANEOUS | Status: DC

## 2019-12-17 SURGICAL SUPPLY — 15 items
BALLN ULTRVRSE 3X100X150 (BALLOONS) ×2
BALLOON ULTRVRSE 3X100X150 (BALLOONS) IMPLANT
CATH CXI SUPP ANG 4FR 135 (CATHETERS) IMPLANT
CATH CXI SUPP ANG 4FR 135CM (CATHETERS) ×2
CATH PIG 70CM (CATHETERS) ×1 IMPLANT
COVER PROBE U/S 5X48 (MISCELLANEOUS) ×1 IMPLANT
DEVICE PRESTO INFLATION (MISCELLANEOUS) ×1 IMPLANT
DEVICE STARCLOSE SE CLOSURE (Vascular Products) ×1 IMPLANT
PACK ANGIOGRAPHY (CUSTOM PROCEDURE TRAY) ×2 IMPLANT
SHEATH BRITE TIP 5FRX11 (SHEATH) ×1 IMPLANT
SHEATH RAABE 6FRX70 (SHEATH) ×1 IMPLANT
TUBING CONTRAST HIGH PRESS 72 (TUBING) ×1 IMPLANT
WIRE G V18X300CM (WIRE) ×1 IMPLANT
WIRE J 3MM .035X145CM (WIRE) ×2 IMPLANT
WIRE MAGIC TORQUE 260C (WIRE) ×1 IMPLANT

## 2019-12-17 NOTE — Progress Notes (Signed)
Patient had $3 in room on bedside table and transport put the money in drawer in room. Me and NT, Thayer Headings witnessed.

## 2019-12-17 NOTE — H&P (Signed)
[facility] History & Physical Update  The patient was interviewed and re-examined.  The patient's previous History and Physical has been reviewed and is unchanged.  There is no change in the plan of care. We plan to proceed with the scheduled procedure.  [name], MD  12/17/2019, 3:37 PM

## 2019-12-17 NOTE — Progress Notes (Signed)
1        PROGRESS NOTE    Patient: Judith Shaw                            PCP: Kirk Ruths, MD                    DOB: 1951/08/17            DOA: 12/15/2019 [id number]             DOS: 12/17/2019, 1:08 PM   LOS: 2 days   Date of Service: The patient was seen and examined on 12/17/2019  Subjective:   The patient was seen and examined this morning, remained stable in bed, no acute distress Plan of care was discussed with the patient including angiogram and possible amputation and current IV antibiotic use.  She seems to be positive by the findings was question she is stating she just wants her here to get in to understand. Denies of have any pain or discomfort.  Brief Narrative:   [name]. Judith Shaw is a 89-yo F with PMH of HTN, uncontrolled DM 2, chronic diastolic CHF, presented to ED with nonhealing chronic left foot wound, diabetic ulcers Work-up revealed patient has osteomyelitis, started on IV antibiotics Traced and vascular team was consulted.  Planning for arteriogram on 12/17/2019, and  Mutation  To be  pursued on 12/19/2019  Assessment & Plan:   Active Problems:   HTN (hypertension)   Diabetes ([place])   Hyperlipidemia   Chronic diastolic CHF (congestive heart failure) ([facility])   Morbid obesity ([facility])   Sleep apnea   Acute osteomyelitis of left foot ([facility])   Dementia without behavioral disturbance ([facility])   Left foot wound with osteomyelitis.   -Continue current IV antibiotics  -Podiatry following and plans on left fifth partial ray amputation on 4/23.   -Vascular surgery following, anticipating arteriogram today 12/17/2019 of left lower extremity -Currently patient is n.p.o. in anticipation of arteriogram   Diabetes mellitus type II - - She is chronically on Tresiba 120 units twice daily at home, nonformulary -pharmacy consulted, switched to Lantus -We will continue with CBG QA CHS, with SSI -Monitoring CBG closely - A1c 11.3.   - Hold Metformin while in  hospital  Hypertension.    - Stable, continue on carvedilol and ramipril  Chronic diastolic congestive heart failure.  -Appears stable, compensated, monitoring daily weights, I's and O's -Continue to hold Lasix for now - Continue on beta-blockers.  Hyperlipidemia -  Continue statin  Obstructive sleep apnea.  We will continue on CPAP.  Morbid obesity.  Would benefit from weight loss.  Dementia.  This is a very recent diagnosis.  She was started on Aricept.   Nutritional status:          Consultants:   Podiatry  Vascular surgery  Procedures:     Antimicrobials:   Vancomycin 4/19 >  Zosyn 4/19 >     ------------------------------------------------------------------------------------------------------------------------------------------------  DVT prophylaxis: Heparin SQ Code Status:   Code Status: Full Code Family Communication: No family member present at bedside - attempt will be made to update daily speak to [facility], her Sister Judith Shaw [phone]) The above findings and plan of care has been discussed with patient (and family )  in detail,  they expressed understanding and agreement of above. -Advance care planning has been discussed.   Admission status:   Status is: Inpatient  Remains inpatient appropriate because:  IV treatments appropriate due to intensity of illness  Dispo: The patient is from: Home              Anticipated d/c is to: Home with home health versus SNF              Anticipated d/c date is: > 3 days              Patient currently is not medically stable to d/c.  Convention, IV antibiotics, amputation  vascular work-up     Procedures:   No admission procedures for hospital encounter.   Arteriogram 12/17/2019  Anticipating amputation 12/19/2019  Antimicrobials:  Anti-infectives (From admission, onward)   Start     Dose/Rate Route Frequency Ordered Stop   12/16/19 2300  vancomycin (VANCOREADY) IVPB 1250 mg/250 mL      1,250 mg 166.7 mL/hr over 90 Minutes Intravenous Every 24 hours 12/15/19 2302     12/16/19 0900  piperacillin-tazobactam (ZOSYN) IVPB 3.375 g     3.375 g 12.5 mL/hr over 240 Minutes Intravenous Every 8 hours 12/16/19 0528     12/15/19 2300  vancomycin (VANCOCIN) IVPB 1000 mg/200 mL premix  Status:  Discontinued     1,000 mg 200 mL/hr over 60 Minutes Intravenous  Once 12/15/19 2135 12/15/19 2244   12/15/19 2245  vancomycin (VANCOREADY) IVPB 2000 mg/400 mL     2,000 mg 200 mL/hr over 120 Minutes Intravenous  Once 12/15/19 2244 12/16/19 0104   12/15/19 2245  piperacillin-tazobactam (ZOSYN) IVPB 3.375 g  Status:  Discontinued     3.375 g 12.5 mL/hr over 240 Minutes Intravenous Every 8 hours 12/15/19 2244 12/16/19 0528   12/15/19 2230  piperacillin-tazobactam (ZOSYN) IVPB 3.375 g  Status:  Discontinued     3.375 g 100 mL/hr over 30 Minutes Intravenous  Once 12/15/19 2227 12/15/19 2244   12/15/19 2230  vancomycin (VANCOCIN) IVPB 1000 mg/200 mL premix  Status:  Discontinued     1,000 mg 200 mL/hr over 60 Minutes Intravenous  Once 12/15/19 2227 12/15/19 2244   12/15/19 2130  piperacillin-tazobactam (ZOSYN) IVPB 3.375 g  Status:  Discontinued     3.375 g 100 mL/hr over 30 Minutes Intravenous  Once 12/15/19 2120 12/15/19 2244   12/15/19 2130  vancomycin (VANCOCIN) IVPB 1000 mg/200 mL premix  Status:  Discontinued     1,000 mg 200 mL/hr over 60 Minutes Intravenous  Once 12/15/19 2120 12/15/19 2244       Medication:  . aspirin EC  81 mg Oral Daily  . baclofen  5 mg Oral QHS  . carvedilol  3.125 mg Oral BID WC  . donepezil  5 mg Oral Daily  . DULoxetine  30 mg Oral Daily  . heparin injection (subcutaneous)  5,000 Units Subcutaneous Q8H  . insulin aspart  0-15 Units Subcutaneous Q6H  . insulin glargine  60 Units Subcutaneous BID  . mirabegron ER  25 mg Oral Daily  . ramipril  5 mg Oral Daily  . simvastatin  40 mg Oral q morning - 10a    acetaminophen **OR** acetaminophen, magnesium  hydroxide, morphine injection, ondansetron **OR** ondansetron (ZOFRAN) IV, traZODone   Objective:   Vitals:   12/16/19 1140 12/16/19 1610 12/17/19 0021 12/17/19 0820  BP: 115/63 (!) 128/54 (!) 130/53 (!) 147/49  Pulse: 75 71 74 68  Resp:  17 17   Temp: 98.3 F (36.8 C) 97.8 F (36.6 C) 98.6 F (37 C) (!) 97.5 F (36.4 C)  TempSrc: Oral Oral Oral Oral  SpO2: 94% 97% 98% 97%  Weight:      Height:        Intake/Output Summary (Last 24 hours) at 12/17/2019 1308 Last data filed at 12/16/2019 1902 Gross per 24 hour  Intake 1077.04 ml  Output --  Net 1077.04 ml   Filed Weights   12/15/19 1724  Weight: 122.5 kg     Examination:   Physical Exam  Constitution:  Alert, cooperative, no distress,  Appears calm and comfortable  Psychiatric: Normal and stable mood and affect, cognition intact,   HEENT: Normocephalic, PERRL, otherwise with in Normal limits  Chest:Chest symmetric Cardio vascular:  S1/S2, RRR, No murmure, No Rubs or Gallops  pulmonary: Clear to auscultation bilaterally, respirations unlabored, negative wheezes / crackles Abdomen: Soft, non-tender, non-distended, bowel sounds,no masses, no organomegaly Muscular skeletal: Limited exam - in bed, able to move all 4 extremities, Normal strength,  Neuro: CNII-XII intact. , normal motor and sensation, reflexes intact  Extremities: No pitting edema lower extremities, +2 pulses  Skin: Dry, warm to touch, negative for any Rashes, left foot wound/ulcer  wounds: Left foot, dressing in place        LABs:  CBC Latest Ref Rng & Units 12/16/2019 12/15/2019 05/17/2019  WBC 4.0 - 10.5 K/uL 7.4 7.4 8.3  Hemoglobin 12.0 - 15.0 g/dL 13.4 14.2 14.5  Hematocrit 36.0 - 46.0 % 39.2 42.7 42.4  Platelets 150 - 400 K/uL 225 241 293   CMP Latest Ref Rng & Units 12/17/2019 12/16/2019 12/15/2019  Glucose 70 - 99 mg/dL 231(H) 147(H) 281(H)  BUN 8 - 23 mg/dL 20 25(H) 27(H)  Creatinine 0.44 - 1.00 mg/dL 0.99 1.09(H) 0.99  Sodium 135 - 145  mmol/L 138 138 132(L)  Potassium 3.5 - 5.1 mmol/L 4.7 4.2 4.9  Chloride 98 - 111 mmol/L 106 105 102  CO2 22 - 32 mmol/L 25 26 23   Calcium 8.9 - 10.3 mg/dL 8.3(L) 8.2(L) 8.9  Total Protein 6.5 - 8.1 g/dL - - 7.2  Total Bilirubin 0.3 - 1.2 mg/dL - - 0.5  Alkaline Phos 38 - 126 U/L - - 81  AST 15 - 41 U/L - - 22  ALT 0 - 44 U/L - - 25        SIGNED: Deatra James, MD, FACP, FHM. [facility],  Pager [phone] ([email or web address])  If 7PM-7AM, please contact night-coverage [email or web address] Ota [facility] 12/17/2019, 1:08 PM

## 2019-12-17 NOTE — Op Note (Addendum)
[facility]  Percutaneous Study/Intervention Procedural Note   Date of Surgery: 12/17/2019  Surgeon(s):Amadeus Oyama    Assistants:none  Pre-operative Diagnosis: PAD with ulceration and infection left lower extremity  Post-operative diagnosis:  Same  Procedure(s) Performed:             1.  Ultrasound guidance for vascular access right femoral artery             2.  Catheter placement into left SFA from right femoral approach             3.  Aortogram and selective left lower extremity angiogram             4.  Percutaneous transluminal angioplasty of left dorsalis pedis artery with 3 mm diameter by 10 cm length angioplasty balloon             5.  StarClose closure device right femoral artery  EBL: 10 cc  Contrast: 55 cc  Fluoro Time: 4.4 minutes  Moderate Conscious Sedation Time: approximately 30 minutes using 3 mg of Versed and 100 mcg of Fentanyl              Indications:  Patient is a 69 y.o.female with ulceration infection of the left foot. The patient is brought in for angiography for further evaluation and potential treatment.  Due to the limb threatening nature of the situation, angiogram was performed for attempted limb salvage. The patient is aware that if the procedure fails, amputation would be expected.  The patient also understands that even with successful revascularization, amputation may still be required due to the severity of the situation.  Risks and benefits are discussed and informed consent is obtained.   Procedure:  The patient was identified and appropriate procedural time out was performed.  The patient was then placed supine on the table and prepped and draped in the usual sterile fashion. Moderate conscious sedation was administered during a face to face encounter with the patient throughout the procedure with my supervision of the RN administering medicines and monitoring the patient's vital signs, pulse oximetry, telemetry and mental  status throughout from the start of the procedure until the patient was taken to the recovery room. Ultrasound was used to evaluate the right common femoral artery.  It was patent .  A digital ultrasound image was acquired.  A Seldinger needle was used to access the right common femoral artery under direct ultrasound guidance and a permanent image was performed.  A 0.035 J wire was advanced without resistance and a 5Fr sheath was placed.  Pigtail catheter was placed into the aorta and an AP aortogram was performed. This demonstrated normal renal arteries and normal aorta and iliac segments without significant stenosis. I then crossed the aortic bifurcation and advanced to the left femoral head and then into the proximal left SFA. Selective left lower extremity angiogram was then performed. This demonstrated normal common femoral artery, profunda femoris artery, superficial femoral artery, and popliteal artery.  She had an abnormal tibial takeoff with the peroneal artery coming off separately above the knee and being a small vessel distally.  The posterior tibial artery was large and continuous into the foot.  The anterior tibial artery was large and continuous to the foot where there appeared to be a short segment occlusion in the dorsalis pedis artery.  This was difficult to see, so we advanced a CXI catheter with help of a V 18 wire down into the anterior tibial artery perform selective  imaging which did demonstrate a short segment occlusion of the dorsalis pedis artery of about 4 to 5 cm with distal reconstitution and flow in the digital vessels beyond the occlusion.  It was felt that it was in the patient's best interest to proceed with intervention after these images to avoid a second procedure and a larger amount of contrast and fluoroscopy based off of the findings from the initial angiogram. The patient was systemically heparinized and a 6 French 70 cm sheath was then placed over the Magic torque wire. I  then used a CXI catheter and the the 18 wire to get down and across the dorsalis pedis artery occlusion and getting a wire down into a digital artery.  I then used a 3 mm diameter by 10 cm length angioplasty balloon to treat the left dorsalis pedis artery and inflated this up to 12 atm for 1 minute.  Completion imaging showed only about a 20 to 25% residual stenosis in the area treated with some spasm of the vessel distal to the area treated but significantly improved flow into the toes and forefoot. I elected to terminate the procedure. The sheath was removed and StarClose closure device was deployed in the right femoral artery with excellent hemostatic result. The patient was taken to the recovery room in stable condition having tolerated the procedure well.  Findings:               Aortogram:  Normal renal arteries, normal aorta and iliac arteries without significant stenosis.             Left lower Extremity:  Normal common femoral artery, profunda femoris artery, superficial femoral artery, and popliteal artery.  She had an abnormal tibial takeoff with the peroneal artery coming off separately above the knee and being a small vessel distally.  The posterior tibial artery was large and continuous into the foot.  The anterior tibial artery was large and continuous to the foot where there was a short segment occlusion in the dorsalis pedis artery of about 5 cm.  There was reconstitution in the digital vessels beyond this.   Disposition: Patient was taken to the recovery room in stable condition having tolerated the procedure well.  Complications: None  [name] 12/17/2019 4:53 PM   This note was created with Dragon Medical transcription system. Any errors in dictation are purely unintentional.

## 2019-12-18 ENCOUNTER — Encounter: Payer: Self-pay | Admitting: Cardiology

## 2019-12-18 DIAGNOSIS — M86172 Other acute osteomyelitis, left ankle and foot: Secondary | ICD-10-CM | POA: Diagnosis not present

## 2019-12-18 DIAGNOSIS — I5032 Chronic diastolic (congestive) heart failure: Secondary | ICD-10-CM | POA: Diagnosis not present

## 2019-12-18 DIAGNOSIS — F0390 Unspecified dementia without behavioral disturbance: Secondary | ICD-10-CM | POA: Diagnosis not present

## 2019-12-18 DIAGNOSIS — E119 Type 2 diabetes mellitus without complications: Secondary | ICD-10-CM | POA: Diagnosis not present

## 2019-12-18 LAB — CBC
HCT: 41.3 % (ref 36.0–46.0)
Hemoglobin: 13.8 g/dL (ref 12.0–15.0)
MCH: 30.4 pg (ref 26.0–34.0)
MCHC: 33.4 g/dL (ref 30.0–36.0)
MCV: 91 fL (ref 80.0–100.0)
Platelets: 295 10*3/uL (ref 150–400)
RBC: 4.54 MIL/uL (ref 3.87–5.11)
RDW: 12.7 % (ref 11.5–15.5)
WBC: 7.1 10*3/uL (ref 4.0–10.5)
nRBC: 0 % (ref 0.0–0.2)

## 2019-12-18 LAB — BASIC METABOLIC PANEL
Anion gap: 6 (ref 5–15)
BUN: 14 mg/dL (ref 8–23)
CO2: 25 mmol/L (ref 22–32)
Calcium: 8 mg/dL — ABNORMAL LOW (ref 8.9–10.3)
Chloride: 106 mmol/L (ref 98–111)
Creatinine, Ser: 0.92 mg/dL (ref 0.44–1.00)
GFR calc Af Amer: >60 mL/min (ref >60)
GFR calc non Af Amer: >60 mL/min (ref >60)
Glucose, Bld: 217 mg/dL — ABNORMAL HIGH (ref 70–99)
Potassium: 4 mmol/L (ref 3.5–5.1)
Sodium: 137 mmol/L (ref 135–145)

## 2019-12-18 LAB — GLUCOSE, CAPILLARY
Glucose-Capillary: 204 mg/dL — ABNORMAL HIGH (ref 70–99)
Glucose-Capillary: 281 mg/dL — ABNORMAL HIGH (ref 70–99)
Glucose-Capillary: 280 mg/dL — ABNORMAL HIGH (ref 70–99)
Glucose-Capillary: 254 mg/dL — ABNORMAL HIGH (ref 70–99)
Glucose-Capillary: 318 mg/dL — ABNORMAL HIGH (ref 70–99)
Glucose-Capillary: 322 mg/dL — ABNORMAL HIGH (ref 70–99)

## 2019-12-18 MED ORDER — VANCOMYCIN HCL 1500 MG/300ML IV SOLN
1500.0000 mg | INTRAVENOUS | Status: DC
  Administered 2019-12-18 – 2019-12-19 (×2): 1500 mg via INTRAVENOUS
  Filled 2019-12-18 (×3): qty 300

## 2019-12-18 MED ORDER — INSULIN ASPART 100 UNIT/ML ~~LOC~~ SOLN
0.0000 [IU] | Freq: Three times a day (TID) | SUBCUTANEOUS | Status: DC
  Administered 2019-12-18 (×2): 11 [IU] via SUBCUTANEOUS
  Administered 2019-12-19 (×2): 5 [IU] via SUBCUTANEOUS
  Administered 2019-12-19: 3 [IU] via SUBCUTANEOUS
  Administered 2019-12-19: 21:00:00 15 [IU] via SUBCUTANEOUS
  Filled 2019-12-18 (×7): qty 1

## 2019-12-18 MED ORDER — HEPARIN SODIUM (PORCINE) 5000 UNIT/ML IJ SOLN: 5000.0000 [IU] | Freq: Three times a day (TID) | INTRAMUSCULAR | Status: DC

## 2019-12-18 NOTE — TOC Initial Note (Signed)
Transition of Care [facility]) - Initial/Assessment Note    Patient Details  Name: Judith Shaw MRN: [id number] Date of Birth: 1950/12/24  [facility] CM/SW Contact:    Su Hilt, RN Phone Number: 12/18/2019, 11:09 AM  Clinical Narrative:                 Patient had Angio, to have Amputation Friday, Will have PT, OT eval and will have IV ABX determined.  The plan is most likely to go to skilled, short term.  Fl2, PASSR and Bedsearch sent with anticipation of being Discharged Monday, will need to review Bed offers and get insurance auth once PT eval completed        Patient Goals and CMS Choice        Expected Discharge Plan and Services                                                Prior Living Arrangements/Services                       Activities of Daily Living Home Assistive Devices/Equipment: Other (Comment)(ortho boot) ADL Screening (condition at time of admission) Patient's cognitive ability adequate to safely complete daily activities?: Yes Is the patient deaf or have difficulty hearing?: No Does the patient have difficulty seeing, even when wearing glasses/contacts?: No Does the patient have difficulty concentrating, remembering, or making decisions?: Yes Patient able to express need for assistance with ADLs?: Yes Does the patient have difficulty dressing or bathing?: No Independently performs ADLs?: Yes (appropriate for developmental age) Does the patient have difficulty walking or climbing stairs?: No Weakness of Legs: Left Weakness of Arms/Hands: None  Permission Sought/Granted                  Emotional Assessment              Admission diagnosis:  Hyperglycemia [R73.9] Acute osteomyelitis of left foot ([place]) [M86.172] Other osteomyelitis of left foot ([place]) [M86.8X7] Diabetic ulcer of other part of left foot associated with type 2 diabetes mellitus, with other ulcer severity ([place]) [id number], L97.528] Patient  Active Problem List   Diagnosis Date Noted  . Dementia without behavioral disturbance ([place]) 12/16/2019  . Acute osteomyelitis of left foot ([place]) 12/15/2019  . Uncontrolled type 2 diabetes mellitus with hyperglycemia, with long-term current use of insulin ([place]) 04/22/2019  . Panic attacks 03/26/2019  . Benign essential HTN 07/19/2018  . Fatty liver 05/15/2018  . Hx of adenomatous colonic polyps 05/15/2018  . Type 2 diabetes mellitus with diabetic nephropathy, with long-term current use of insulin ([place]) 05/01/2018  . Tinnitus, bilateral 04/05/2017  . Concussion syndrome 04/03/2017  . Concussion without loss of consciousness 01/24/2017  . Difficulty walking 01/24/2017  . Headache disorder 01/24/2017  . Numbness and tingling 01/24/2017  . Postural urinary incontinence 01/24/2017  . Sepsis ([place]) 09/21/2016  . UTI (urinary tract infection) 09/21/2016  . HTN (hypertension) 09/21/2016  . Diabetes ([place]) 09/21/2016  . Depression 09/21/2016  . Health care maintenance 10/05/2015  . Recurrent major depressive disorder, in full remission ([place]) 06/16/2014  . DM (diabetes mellitus) type II controlled, neurological manifestation ([place]) 06/12/2014  . Morbid obesity ([place]) 06/12/2014  . Hyperlipidemia 03/10/2014  . Chronic diastolic CHF (congestive heart failure) ([place]) 03/10/2014  . H/O diastolic dysfunction [id number]  . Sleep  apnea 03/10/2014  . SOB (shortness of breath) 03/10/2014   PCP:  Kirk Ruths, MD Pharmacy:   [facility] [name] [place] [address] Phone: [phone] Fax: [phone]     Social Determinants of Health (SDOH) Interventions    Readmission Risk Interventions No flowsheet data found.

## 2019-12-18 NOTE — Consult Note (Addendum)
PODIATRY / FOOT AND ANKLE SURGERY CONSULTATION NOTE  Requesting Physician: Dr. Eugenie Norrie  Reason for consult: Left foot infection  Chief Complaint: Left fifth metatarsal phalangeal joint wound with cellulitis   HPI: Judith Shaw is a 69 y.o. female who presents with an ulceration to the left fifth metatarsal phalangeal joint.  Patient has been treated in the outpatient clinic setting with multiple wounds to the left foot including the big toe, second toe, third toe, fourth toe and now the fifth metatarsal phalangeal joint.  Patient manipulates the wound and her caretaker also has manipulating the wounds and could have potentially caused some of the wounds on the feet by removing some dry skin present.  Patient was seen in clinic yesterday and was noted to have increased redness and swelling to the left foot with purulent type drainage coming from the wound and the redness appeared to be streaking up the leg to the ankle joint area.  Patient was subsequently sent to the emergency room for admission.  Patient was admitted to the hospital for further work-up.  X-ray was taken showing osteomyelitic changes to the left fifth metatarsal phalangeal joint area.  Patient is type II diabetic and is uncontrolled and her last hemoglobin A1c was around 11%.  Patient has multiple comorbidities and issues.  ABIs were performed in the past within the last month or 2 which showed triphasic waveforms and no evidence of peripheral vascular disease.  Patient presents with caretaker today.  Patient states she has a mild amount of pain to the left foot but overall has mostly numbness and tingling.  Patient sister Judith Shaw is close to being the power of attorney as the patient does not have decision-making capacity due to dementia.  PMHx:  Past Medical History:  Diagnosis Date  . Anxiety   . Cancer ([place])    pt states hx of uterine cancer and had a complete hyst  . CHF (congestive heart failure) ([place])   . Depression   .  Diabetes mellitus without complication ([place])   . Hyperlipidemia   . Hypertension     Surgical Hx:  Past Surgical History:  Procedure Laterality Date  . ABDOMINAL HYSTERECTOMY    . CHOLECYSTECTOMY    . LOWER EXTREMITY ANGIOGRAPHY Left 12/17/2019   Procedure: Lower Extremity Angiography;  Surgeon: Algernon Huxley, MD;  Location: [facility];  Service: Cardiovascular;  Laterality: Left;    FHx:  Family History  Problem Relation Age of Onset  . Hypertension Mother   . Heart disease Father   . Prostate cancer Brother   . Diabetes Maternal Aunt   . Kidney cancer Neg Hx   . Bladder Cancer Neg Hx     Social History:  reports that she has never smoked. She has never used smokeless tobacco. She reports that she does not drink alcohol or use drugs.  Allergies:  Allergies  Allergen Reactions  . Codeine     Review of Systems: General ROS: negative Psychological ROS: negative Respiratory ROS: no cough, shortness of breath, or wheezing Cardiovascular ROS: no chest pain or dyspnea on exertion Musculoskeletal ROS: positive for - joint pain and joint swelling Neurological ROS: positive for - numbness/tingling Dermatological ROS: positive for Left fifth toe, fourth toe ulcerations  Medications Prior to Admission  Medication Sig Dispense Refill  . acetaminophen (TYLENOL) 500 MG tablet Take 500 mg by mouth every 6 (six) hours as needed.    Marland Kitchen aspirin EC 81 MG tablet Take 81 mg by mouth  daily.    . Baclofen 5 MG TABS Take 5 mg by mouth at bedtime. 5 tablet 0  . carvedilol (COREG) 3.125 MG tablet Take 3.125 mg by mouth 2 (two) times daily with a meal.    . clotrimazole-betamethasone (LOTRISONE) cream Apply 1 application topically 2 (two) times daily.    Marland Kitchen donepezil (ARICEPT) 5 MG tablet Take 5 mg by mouth daily.    . DULoxetine HCl 60 MG CSDR Take 60 mg by mouth daily.     . furosemide (LASIX) 20 MG tablet Take 20 mg by mouth every other day.     Marland Kitchen gentian violet 2 % topical solution  Apply 0.5 mLs topically in the morning and at bedtime. Apply between the toes twice daily.    . insulin lispro (HUMALOG) 100 UNIT/ML KwikPen Inject 200 Units into the skin See admin instructions.    Marland Kitchen ketoconazole (NIZORAL) 2 % cream Apply 1 application topically in the morning and at bedtime.    . meloxicam (MOBIC) 7.5 MG tablet Take 1 tablet (7.5 mg total) by mouth daily. 10 tablet 0  . metFORMIN (GLUCOPHAGE-XR) 500 MG 24 hr tablet Take 500 mg by mouth daily. With dinner.    . mirabegron ER (MYRBETRIQ) 25 MG TB24 tablet Take 1 tablet (25 mg total) by mouth daily. 30 tablet 0  . potassium chloride (K-DUR,KLOR-CON) 10 MEQ tablet Take 10 mEq by mouth daily.    . ramipril (ALTACE) 5 MG capsule Take 5 mg by mouth daily.    Marland Kitchen SANTYL ointment Apply 1 application topically daily.    . simvastatin (ZOCOR) 40 MG tablet Take 40 mg by mouth every morning.     Marland Kitchen terconazole (TERAZOL 7) 0.4 % vaginal cream Place 1 applicator vaginally at bedtime.    Tyler Aas FLEXTOUCH 200 UNIT/ML FlexTouch Pen Inject 120 Units into the skin in the morning and at bedtime.    . triamcinolone lotion (KENALOG) 0.1 % Apply 1 application topically 3 (three) times daily.      Physical Exam: General: Alert and oriented.  No apparent distress. Vascular: DP/PT pulses nonpalpable to bilateral lower extremities.  Capillary fill time appears to be mildly delayed to the digits of the left foot.  No hair growth present to bilateral lower extremities.  Erythema and edema present to the left foot over the fifth metatarsal phalangeal joint with extension proximally to the midfoot level.  Neuro: Light touch sensation diminished to bilateral lower extremities.  Derm: Ulceration present to the left fifth metatarsal phalangeal joint laterally with fibronecrotic wound base and bone exposed at the fifth metatarsal phalangeal joint level, serous sanguinous drainage present today, no purulence, mild odor, no palpable abscess, no fluctuance,  associated erythema and edema present around the periphery of the wound extending proximally to the midfoot.  Ulceration present to the left lateral PIPJ fourth toe, measures approximately 0.2 x 0.2 x 0.3 cm and goes close to bone to the level of tendon, no associated erythema and edema present to this level, no drainage, no odor  Skin appears to be thin and atrophic to bilateral lower extremities.  Nails are thickened and discolored subungual debris x10 to both feet.  MSK: Hammertoe contractures digits 2 through 5 bilateral.  Limited ankle joint dorsiflexion with knee extended but improved knee flexion bilateral.  Results for orders placed or performed during the hospital encounter of 12/15/19 (from the past 48 hour(s))  Aerobic/Anaerobic Culture (surgical/deep wound)     Status: None (Preliminary result)   Collection Time:  12/16/19  3:35 PM   Specimen: Wound  Result Value Ref Range   Specimen Description      WOUND Performed at [facility], [address] [place], [place] [address]    Special Requests      NONE Performed at [facility], [place], [place] [address]    Gram Stain      NO WBC SEEN ABUNDANT GRAM POSITIVE COCCI FEW GRAM NEGATIVE RODS    Culture      MODERATE STREPTOCOCCUS ANGINOSIS HOLDING FOR POSSIBLE ANAEROBE Performed at [facility], [place] [address] [place], [place] [address]    Report Status PENDING    Organism ID, Bacteria STREPTOCOCCUS ANGINOSIS       Susceptibility   Streptococcus anginosis - MIC*    PENICILLIN 0.12 SENSITIVE Sensitive     CEFTRIAXONE 0.5 SENSITIVE Sensitive     ERYTHROMYCIN >=8 RESISTANT Resistant     LEVOFLOXACIN 0.5 SENSITIVE Sensitive     VANCOMYCIN 0.5 SENSITIVE Sensitive     * MODERATE STREPTOCOCCUS ANGINOSIS  Glucose, capillary     Status: Abnormal   Collection Time: 12/16/19  4:53 PM  Result Value Ref Range   Glucose-Capillary 253 (H) 70 - 99 mg/dL    Comment: Glucose reference  range applies only to samples taken after fasting for at least 8 hours.   Comment 1 Notify RN   Glucose, capillary     Status: Abnormal   Collection Time: 12/16/19  9:29 PM  Result Value Ref Range   Glucose-Capillary 270 (H) 70 - 99 mg/dL    Comment: Glucose reference range applies only to samples taken after fasting for at least 8 hours.   Comment 1 Notify RN   Glucose, capillary     Status: Abnormal   Collection Time: 12/17/19  4:08 AM  Result Value Ref Range   Glucose-Capillary 196 (H) 70 - 99 mg/dL    Comment: Glucose reference range applies only to samples taken after fasting for at least 8 hours.  Basic metabolic panel     Status: Abnormal   Collection Time: 12/17/19  5:13 AM  Result Value Ref Range   Sodium 138 135 - 145 mmol/L   Potassium 4.7 3.5 - 5.1 mmol/L   Chloride 106 98 - 111 mmol/L   CO2 25 22 - 32 mmol/L   Glucose, Bld 231 (H) 70 - 99 mg/dL    Comment: Glucose reference range applies only to samples taken after fasting for at least 8 hours.   BUN 20 8 - 23 mg/dL   Creatinine, Ser 0.99 0.44 - 1.00 mg/dL   Calcium 8.3 (L) 8.9 - 10.3 mg/dL   GFR calc non Af Amer 58 (L) >60 mL/min   GFR calc Af Amer >60 >60 mL/min   Anion gap 7 5 - 15    Comment: Performed at [facility], [address] [place], [place] [address]  Glucose, capillary     Status: Abnormal   Collection Time: 12/17/19  8:22 AM  Result Value Ref Range   Glucose-Capillary 224 (H) 70 - 99 mg/dL    Comment: Glucose reference range applies only to samples taken after fasting for at least 8 hours.  Surgical pcr screen     Status: Abnormal   Collection Time: 12/17/19  8:53 AM   Specimen: Nasal Mucosa; Nasal Swab  Result Value Ref Range   MRSA, PCR NEGATIVE NEGATIVE   Staphylococcus aureus POSITIVE (A) NEGATIVE    Comment: (NOTE) The  Xpert SA Assay (FDA approved for NASAL specimens in patients 54 years of age and older), is one component of a comprehensive surveillance program. It is not  intended to diagnose infection nor to guide or monitor treatment. Performed at [facility], [address] [place], [place] [address]   Glucose, capillary     Status: Abnormal   Collection Time: 12/17/19  2:01 PM  Result Value Ref Range   Glucose-Capillary 186 (H) 70 - 99 mg/dL    Comment: Glucose reference range applies only to samples taken after fasting for at least 8 hours.  Glucose, capillary     Status: Abnormal   Collection Time: 12/17/19  3:21 PM  Result Value Ref Range   Glucose-Capillary 208 (H) 70 - 99 mg/dL    Comment: Glucose reference range applies only to samples taken after fasting for at least 8 hours.  Glucose, capillary     Status: Abnormal   Collection Time: 12/17/19  5:22 PM  Result Value Ref Range   Glucose-Capillary 247 (H) 70 - 99 mg/dL    Comment: Glucose reference range applies only to samples taken after fasting for at least 8 hours.  Glucose, capillary     Status: Abnormal   Collection Time: 12/17/19 10:08 PM  Result Value Ref Range   Glucose-Capillary 309 (H) 70 - 99 mg/dL    Comment: Glucose reference range applies only to samples taken after fasting for at least 8 hours.  Glucose, capillary     Status: Abnormal   Collection Time: 12/18/19  4:12 AM  Result Value Ref Range   Glucose-Capillary 204 (H) 70 - 99 mg/dL    Comment: Glucose reference range applies only to samples taken after fasting for at least 8 hours.  CBC     Status: None   Collection Time: 12/18/19  5:05 AM  Result Value Ref Range   WBC 7.1 4.0 - 10.5 K/uL   RBC 4.54 3.87 - 5.11 MIL/uL   Hemoglobin 13.8 12.0 - 15.0 g/dL   HCT 41.3 36.0 - 46.0 %   MCV 91.0 80.0 - 100.0 fL   MCH 30.4 26.0 - 34.0 pg   MCHC 33.4 30.0 - 36.0 g/dL   RDW 12.7 11.5 - 15.5 %   Platelets 295 150 - 400 K/uL   nRBC 0.0 0.0 - 0.2 %    Comment: Performed at [facility], [address], [place], [place] [id number]  Basic metabolic panel     Status: Abnormal   Collection Time: 12/18/19   5:05 AM  Result Value Ref Range   Sodium 137 135 - 145 mmol/L   Potassium 4.0 3.5 - 5.1 mmol/L   Chloride 106 98 - 111 mmol/L   CO2 25 22 - 32 mmol/L   Glucose, Bld 217 (H) 70 - 99 mg/dL    Comment: Glucose reference range applies only to samples taken after fasting for at least 8 hours.   BUN 14 8 - 23 mg/dL   Creatinine, Ser 0.92 0.44 - 1.00 mg/dL   Calcium 8.0 (L) 8.9 - 10.3 mg/dL   GFR calc non Af Amer >60 >60 mL/min   GFR calc Af Amer >60 >60 mL/min   Anion gap 6 5 - 15    Comment: Performed at [facility], [address] [place], [place] [address]  Glucose, capillary     Status: Abnormal   Collection Time: 12/18/19  9:35 AM  Result Value Ref Range   Glucose-Capillary 281 (H) 70 -  99 mg/dL    Comment: Glucose reference range applies only to samples taken after fasting for at least 8 hours.  Glucose, capillary     Status: Abnormal   Collection Time: 12/18/19 11:20 AM  Result Value Ref Range   Glucose-Capillary 280 (H) 70 - 99 mg/dL    Comment: Glucose reference range applies only to samples taken after fasting for at least 8 hours.  Glucose, capillary     Status: Abnormal   Collection Time: 12/18/19 12:10 PM  Result Value Ref Range   Glucose-Capillary 254 (H) 70 - 99 mg/dL    Comment: Glucose reference range applies only to samples taken after fasting for at least 8 hours.   PERIPHERAL VASCULAR CATHETERIZATION  Result Date: 12/17/2019 See op note   Blood pressure (!) 154/45, pulse 70, temperature 98.1 F (36.7 C), temperature source Oral, resp. rate 18, height 5\' 5"  (1.651 m), weight 122.5 kg, SpO2 94 %.  Assessment 1. Osteomyelitis left fifth metatarsal phalangeal joint with associated cellulitis and overlying wound with necrosis of bone to the fifth MPJ 2. Left fourth toe ulceration, depth to tendon at the PIPJ 3. Type 2 diabetes, uncontrolled 4. PVD 5. Dementia  Plan -Patient seen and examined. -Ulceration to the left fifth metatarsal phalangeal  joint probes to bone at the fifth metatarsal phalangeal joint area has capsule and bone appears to be exposed through the wound with fibronecrotic changes to the wound base.  Erythema and edema present around the periphery of the wound with extension proximally over the midfoot. -Ulceration to the left fourth toe appears to probe to tendon but still has coverage over bone and no bone is palpable, no associated signs of infection to this area. -X-ray imaging reviewed and discussed with patient and caretaker in detail showing osteomyelitic changes to the left fifth metatarsal phalangeal joint. -Appreciate vascular recommendations.  Plan for angiogram tomorrow to assess blood flow further. -We will plan for procedure on the left foot on Friday consisting of left fifth partial ray amputation.  Discussed all treatment options with the patient both conservative and surgical attempts at correction: Potential risks and complications at this time patient is elected for surgery consisting of left partial fifth ray amputation.  Discussed this with power of attorney, Judith Shaw, patient's sister as well and is agreeable. -Discussed treatment with Judith Shaw in detail.  Patient sister is concerned due to patient's comorbidities and would potentially like to have the patient transferred as she knows the [place] area better.  Reassured family that these conditions that the patient has are relatively common in this area and have been treated for in the past with routine care.  Gave patient sister the hospitalist information to talk about this further. -Orders placed for dressings daily.  Order also placed for wound culture left foot.  To be collected. -Appreciate recommendations for antibiotics per medicine.  We will take intraoperative cultures well as well as bone cultures. -Patient can maintain partial weightbearing with surgical shoe on heel contact at all times.  PT/OT to be placed after surgical procedure. -Hold blood thinner  medications 24h prior to procedure on 12/19/19, restart 24h after surgery on 12/20/19.  Caroline More, DPM 12/18/2019, 12:38 PM

## 2019-12-18 NOTE — Progress Notes (Signed)
[facility] Daily Progress Note   Subjective: 12/17/19: 1. Ultrasound guidance for vascular access right femoral artery 2. Catheter placement into left SFA from right femoral approach 3. Aortogram and selective left lower extremity angiogram 4. Percutaneous transluminal angioplasty of left dorsalis pedis artery with 3 mm diameter by 10 cm length angioplasty balloon 5.  StarClose closure device right femoral artery  Patient without complaint.  No issues overnight.  Objective: Vitals:   12/17/19 1820 12/17/19 1953 12/18/19 0012 12/18/19 0739  BP: 135/64  132/60 (!) 154/45  Pulse: 63  67 70  Resp: 16  17 18   Temp: 97.8 F (36.6 C) 98.6 F (37 C) 98.6 F (37 C) 98.1 F (36.7 C)  TempSrc: Oral Oral Oral Oral  SpO2: 96%  96% 94%  Weight:      Height:        Intake/Output Summary (Last 24 hours) at 12/18/2019 1051 Last data filed at 12/18/2019 0335 Gross per 24 hour  Intake 2057.25 ml  Output --  Net 2057.25 ml   Physical Exam: A&Ox3, NAD CV: RRR Pulmonary: CTA Bilaterally Abdomen: Soft, Nontender, Nondistended Vascular:  Left lower extremity: Thigh soft.  Calf soft.  Extremities warm distally toes.   Laboratory: CBC    Component Value Date/Time   WBC 7.1 12/18/2019 0505   HGB 13.8 12/18/2019 0505   HGB 12.1 05/25/2012 0016   HCT 41.3 12/18/2019 0505   HCT 47.1 (H) 04/17/2019 2322   PLT 295 12/18/2019 0505   PLT 319 05/25/2012 0016   BMET    Component Value Date/Time   NA 137 12/18/2019 0505   NA 141 05/25/2012 0016   K 4.0 12/18/2019 0505   K 4.2 05/25/2012 0016   CL 106 12/18/2019 0505   CL 105 05/25/2012 0016   CO2 25 12/18/2019 0505   CO2 28 05/25/2012 0016   GLUCOSE 217 (H) 12/18/2019 0505   GLUCOSE 126 (H) 05/25/2012 0016   BUN 14 12/18/2019 0505   BUN 14 05/25/2012 0016   CREATININE 0.92 12/18/2019 0505   CREATININE 1.16 05/25/2012 0016   CALCIUM 8.0 (L)  12/18/2019 0505   CALCIUM 9.0 05/25/2012 0016   GFRNONAA >60 12/18/2019 0505   GFRNONAA 50 (L) 05/25/2012 0016   GFRAA >60 12/18/2019 0505   GFRAA 58 (L) 05/25/2012 0016   Assessment/Planning: The patient is a 69 year old female with chronic wounds to the left foot status post left lower extremity endovascular intervention - POD#1  1) atherosclerotic disease left lower extremity: status post endovascular intervention - POD#1 On aspirin and statin for medical management For possible surgery tomorrow with podiatry  Discussed with Dr. Ellis Parents Lorie Cleckley PA-C 12/18/2019 10:51 AM

## 2019-12-18 NOTE — Plan of Care (Signed)
  Problem: Education: Goal: Knowledge of General Education information will improve Description: Including pain rating scale, medication(s)/side effects and non-pharmacologic comfort measures Outcome: Progressing   Problem: Clinical Measurements: Goal: Ability to maintain clinical measurements within normal limits will improve Outcome: Progressing Goal: Will remain free from infection Outcome: Progressing Goal: Diagnostic test results will improve Outcome: Progressing Goal: Respiratory complications will improve Outcome: Progressing Goal: Cardiovascular complication will be avoided Outcome: Progressing   Problem: Activity: Goal: Risk for activity intolerance will decrease Outcome: Progressing   Problem: Pain Managment: Goal: General experience of comfort will improve Outcome: Progressing   Problem: Safety: Goal: Ability to remain free from injury will improve Outcome: Progressing   

## 2019-12-18 NOTE — Care Management Important Message (Signed)
Important Message  Patient Details  Name: Judith Shaw MRN: [id number] Date of Birth: 1951-05-09   Medicare Important Message Given:  Yes     [name] 12/18/2019, 12:50 PM

## 2019-12-18 NOTE — Anesthesia Preprocedure Evaluation (Addendum)
Anesthesia Evaluation  Patient identified by MRN, date of birth, ID band Patient awake    Reviewed: Allergy & Precautions, NPO status , Patient's Chart, lab work & pertinent test results  History of Anesthesia Complications Negative for: history of anesthetic complications  Airway Mallampati: III  TM Distance: >3 FB Neck ROM: Full   Comment: Very large neck Dental no notable dental hx. (+) Teeth Intact, Dental Advisory Given   Pulmonary neg pulmonary ROS, sleep apnea , neg COPD, Patient abstained from smoking.Not current smoker,  Never wore cpap   Pulmonary exam normal breath sounds clear to auscultation       Cardiovascular Exercise Tolerance: Good METShypertension, +CHF  (-) CAD and (-) Past MI negative cardio ROS  (-) dysrhythmias  Rhythm:Regular Rate:Normal - Systolic murmurs    Neuro/Psych  Headaches, PSYCHIATRIC DISORDERS Anxiety Depression Dementia negative neurological ROS  negative psych ROS   GI/Hepatic neg GERD  ,(+)     (-) substance abuse  ,   Endo/Other  diabetesMorbid obesity  Renal/GU CRFRenal diseasenegative Renal ROS     Musculoskeletal   Abdominal (+) + obese,   Peds  Hematology   Anesthesia Other Findings Past Medical History: No date: Anxiety No date: Cancer [facility])     Comment:  pt states hx of uterine cancer and had a complete hyst No date: CHF (congestive heart failure) ([facility]) No date: Depression No date: Diabetes mellitus without complication ([facility]) No date: Hyperlipidemia No date: Hypertension   Reproductive/Obstetrics                           Anesthesia Physical Anesthesia Plan  ASA: III  Anesthesia Plan: General   Post-op Pain Management:    Induction: Intravenous  PONV Risk Score and Plan: 3 and Ondansetron, Propofol infusion and TIVA  Airway Management Planned: Natural Airway and Oral ETT  Additional Equipment: None  Intra-op Plan:    Post-operative Plan:   Informed Consent: I have reviewed the patients History and Physical, chart, labs and discussed the procedure including the risks, benefits and alternatives for the proposed anesthesia with the patient or authorized representative who has indicated his/her understanding and acceptance.     Dental advisory given  Plan Discussed with: CRNA and Surgeon  Anesthesia Plan Comments: (Discussed risks of anesthesia with patient (and also guardian/POA sister Robyn Haber), including possibility of difficulty with spontaneous ventilation under anesthesia necessitating airway intervention, PONV, and rare risks such as cardiac or respiratory or neurological events. Patient and sister understand. Will plan for as light sedation as possible, with local anesthetic infiltration by surgeon.)       Anesthesia Quick Evaluation

## 2019-12-18 NOTE — NC FL2 (Signed)
[place] LEVEL OF CARE SCREENING TOOL     IDENTIFICATION  Patient Name: Judith Shaw Birthdate: 06/19/1951 Sex: female Admission Date (Current Location): 12/15/2019  [place] and [place] Number:  Engineering geologist and Address:  [facility], [address], [place], [place] [address]      Provider Number: [id number]  Attending Physician Name and Address:  Deatra James, MD  Relative Name and Phone Number:  Abe People [phone]    Current Level of Care: Hospital Recommended Level of Care: Anton Ruiz Prior Approval Number:    Date Approved/Denied:   PASRR Number: [id number]  Discharge Plan: SNF    Current Diagnoses: Patient Active Problem List   Diagnosis Date Noted  . Dementia without behavioral disturbance ([place]) 12/16/2019  . Acute osteomyelitis of left foot ([place]) 12/15/2019  . Uncontrolled type 2 diabetes mellitus with hyperglycemia, with long-term current use of insulin ([place]) 04/22/2019  . Panic attacks 03/26/2019  . Benign essential HTN 07/19/2018  . Fatty liver 05/15/2018  . Hx of adenomatous colonic polyps 05/15/2018  . Type 2 diabetes mellitus with diabetic nephropathy, with long-term current use of insulin ([place]) 05/01/2018  . Tinnitus, bilateral 04/05/2017  . Concussion syndrome 04/03/2017  . Concussion without loss of consciousness 01/24/2017  . Difficulty walking 01/24/2017  . Headache disorder 01/24/2017  . Numbness and tingling 01/24/2017  . Postural urinary incontinence 01/24/2017  . Sepsis ([place]) 09/21/2016  . UTI (urinary tract infection) 09/21/2016  . HTN (hypertension) 09/21/2016  . Diabetes ([place]) 09/21/2016  . Depression 09/21/2016  . Health care maintenance 10/05/2015  . Recurrent major depressive disorder, in full remission ([place]) 06/16/2014  . DM (diabetes mellitus) type II controlled, neurological manifestation ([place]) 06/12/2014  . Morbid obesity ([place]) 06/12/2014  . Hyperlipidemia  03/10/2014  . Chronic diastolic CHF (congestive heart failure) ([place]) 03/10/2014  . H/O diastolic dysfunction [id number]  . Sleep apnea 03/10/2014  . SOB (shortness of breath) 03/10/2014    Orientation RESPIRATION BLADDER Height & Weight     Self  Normal External catheter Weight: 122.5 kg Height:  5\' 5"  (165.1 cm)  BEHAVIORAL SYMPTOMS/MOOD NEUROLOGICAL BOWEL NUTRITION STATUS      Continent Diet(Carb Modified)  AMBULATORY STATUS COMMUNICATION OF NEEDS Skin   Extensive Assist Verbally Surgical wounds                       Personal Care Assistance Level of Assistance              Functional Limitations Info             SPECIAL CARE FACTORS FREQUENCY  PT (By licensed PT), OT (By licensed OT)     PT Frequency: 5 times per week OT Frequency: 5 times per week            Contractures Contractures Info: Not present    Additional Factors Info  Allergies   Allergies Info: codeine           Current Medications (12/18/2019):  This is the current hospital active medication list Current Facility-Administered Medications  Medication Dose Route Frequency Provider Last Rate Last Admin  . 0.9 %  sodium chloride infusion   Intravenous Continuous Algernon Huxley, MD 100 mL/hr at 12/17/19 1526 New Bag at 12/17/19 1526  . acetaminophen (TYLENOL) tablet 650 mg  650 mg Oral Q6H PRN Algernon Huxley, MD       Or  . acetaminophen (TYLENOL) suppository 650 mg  650  mg Rectal Q6H PRN Algernon Huxley, MD      . aspirin EC tablet 81 mg  81 mg Oral Daily Algernon Huxley, MD   81 mg at 12/18/19 0905  . baclofen (LIORESAL) tablet 5 mg  5 mg Oral QHS Algernon Huxley, MD   5 mg at 12/17/19 2219  . carvedilol (COREG) tablet 3.125 mg  3.125 mg Oral BID WC Algernon Huxley, MD   3.125 mg at 12/18/19 0904  . chlorhexidine (HIBICLENS) 4 % liquid 4 application  60 mL Topical Once Caroline More, DPM      . donepezil (ARICEPT) tablet 5 mg  5 mg Oral Daily Algernon Huxley, MD   5 mg at 12/18/19 0904  . DULoxetine  (CYMBALTA) DR capsule 30 mg  30 mg Oral Daily Algernon Huxley, MD   30 mg at 12/18/19 0904  . heparin injection 5,000 Units  5,000 Units Subcutaneous Q8H Algernon Huxley, MD   5,000 Units at 12/18/19 0545  . HYDROmorphone (DILAUDID) injection 1 mg  1 mg Intravenous Once PRN Algernon Huxley, MD      . insulin aspart (novoLOG) injection 0-15 Units  0-15 Units Subcutaneous TID AC & HS Shahmehdi, Seyed A, MD      . insulin glargine (LANTUS) injection 60 Units  60 Units Subcutaneous BID Algernon Huxley, MD   60 Units at 12/18/19 [phone]  . magnesium hydroxide (MILK OF MAGNESIA) suspension 30 mL  30 mL Oral Daily PRN Algernon Huxley, MD      . mirabegron ER [facility]) tablet 25 mg  25 mg Oral Daily Algernon Huxley, MD   25 mg at 12/18/19 0905  . morphine 2 MG/ML injection 2 mg  2 mg Intravenous Q4H PRN Algernon Huxley, MD      . mupirocin ointment (BACTROBAN) 2 % 1 application  1 application Nasal BID Algernon Huxley, MD   1 application at [id number] [phone]  . ondansetron (ZOFRAN) tablet 4 mg  4 mg Oral Q6H PRN Algernon Huxley, MD       Or  . ondansetron (ZOFRAN) injection 4 mg  4 mg Intravenous Q6H PRN Algernon Huxley, MD      . ondansetron (ZOFRAN) injection 4 mg  4 mg Intravenous Q6H PRN Algernon Huxley, MD      . piperacillin-tazobactam (ZOSYN) IVPB 3.375 g  3.375 g Intravenous Q8H Algernon Huxley, MD 12.5 mL/hr at 12/18/19 0907 3.375 g at 12/18/19 0907  . povidone-iodine 10 % swab 2 application  2 application Topical Once Caroline More, DPM      . ramipril (ALTACE) capsule 5 mg  5 mg Oral Daily Algernon Huxley, MD   5 mg at 12/18/19 0905  . simvastatin (ZOCOR) tablet 40 mg  40 mg Oral q morning - 10a Algernon Huxley, MD   40 mg at 12/16/19 1741  . traZODone (DESYREL) tablet 25 mg  25 mg Oral QHS PRN Algernon Huxley, MD      . vancomycin (VANCOREADY) IVPB 1500 mg/300 mL  1,500 mg Intravenous Q24H Lu Duffel, [facility]         Discharge Medications: Please see discharge summary for a list of discharge medications.  Relevant Imaging  Results:  Relevant Lab Results:   Additional Information SS# [id number]  Su Hilt, RN

## 2019-12-18 NOTE — Progress Notes (Signed)
1        PROGRESS NOTE    Patient: Judith Shaw                            PCP: Kirk Ruths, MD                    DOB: 12-Aug-1951            DOA: 12/15/2019 [id number]             DOS: 12/18/2019, 1:03 PM   LOS: 3 days   Date of Service: The patient was seen and examined on 12/18/2019  Subjective:   The patient was seen and examined this morning, stable laying in bed. Nursing staff present at bedside  Once again she had been explained the whole plan of care she did not remember from yesterday. She tolerated procedure yesterday well,arteriogram procedure was completed. Currently stable denies any fever, tolerating p.o.  Brief Narrative:   Ms. Judith Body. Shaw is a 58-yo F with PMH of HTN, uncontrolled DM 2, chronic diastolic CHF, presented to ED with nonhealing chronic left foot wound, diabetic ulcers Work-up revealed patient has osteomyelitis, started on IV antibiotics Traced and vascular team was consulted. The patient completed arteriogram on 12/17/2019, and   pursued intraoperative amputation on 12/19/2019  Assessment & Plan:   Active Problems:   HTN (hypertension)   Diabetes ([place])   Hyperlipidemia   Chronic diastolic CHF (congestive heart failure) (HCC)   Morbid obesity (HCC)   Sleep apnea   Acute osteomyelitis of left foot (HCC)   Dementia without behavioral disturbance (HCC)   Left foot wound with osteomyelitis.   -Medical remained stable, will continue current antibiotics -Podiatry following and plans on left fifth partial ray amputation on 4/23.   -Vascular surgery following,  - Status post arteriogram 12/17/2019 of left lower extremity--ulcerated well Report reviewed  Diabetes mellitus type II - - She is chronically on Tresiba 120 units twice daily at home,  pharmacy consulted, switched to Lantus -We will continue with CBG QA CHS, with SSI -Monitoring CBG, stable - A1c 11.3.   - Hold Metformin while in hospital  Hypertension.    - Stable, continue  on carvedilol and ramipril  Chronic diastolic congestive heart failure.  -Appears stable, compensated, monitoring daily weights, I's and O's -Continue to hold Lasix for now - Continue on beta-blockers.  Hyperlipidemia -  Continue statin Obstructive sleep apnea.  We will continue on CPAP. Morbid obesity.  Would benefit from weight loss.  Dementia.  This is a very recent diagnosis.  She was started on Aricept. Poor short-term memory.   Nutritional status:          Consultants:   Podiatry  Vascular surgery  Procedures:     Antimicrobials:   Vancomycin 4/19 >  Zosyn 4/19 >    -------------------------------------------------------------------------------------------------------------------------------- DVT prophylaxis: Heparin SQ Code Status:   Code Status: Full Code Family Communication: No family member present at bedside - attempt will be made to update daily speak to [facility], her Sister Judith Shaw [phone]) The above findings and plan of care has been discussed with patient (and family )  in detail,  they expressed understanding and agreement of above. -Advance care planning has been discussed.   Admission status:   Status is: Inpatient  Remains inpatient appropriate because: IV treatments appropriate due to intensity of illness  Dispo: The patient is from: Home  Anticipated d/c is to: Home with home health versus SNF              Anticipated d/c date is: > 3 days likely Monday  Discussed with social work updates per sister daily plan is to proceed with amputation Friday, 12/19/2019, then to determine IV antibiotics versus p.o., followed by evaluation by PT OT,  subsequently will be determined if she needs to go to SNF-sister is agreeable with plan.                Patient currently is not medically stable to d/c.  Convention, IV antibiotics, amputation  vascular work-up     Procedures:   No admission procedures for hospital  encounter.   Arteriogram 12/17/2019 --- reviewed  Intraoperative surgical Amputation 12/19/2019 >>   Antimicrobials:  Anti-infectives (From admission, onward)   Start     Dose/Rate Route Frequency Ordered Stop   12/18/19 2200  vancomycin (VANCOREADY) IVPB 1500 mg/300 mL     1,500 mg 150 mL/hr over 120 Minutes Intravenous Every 24 hours 12/18/19 0725     12/17/19 1545  ceFAZolin (ANCEF) IVPB 2g/100 mL premix  Status:  Discontinued    Note to Pharmacy: To be given in specials   2 g 200 mL/hr over 30 Minutes Intravenous  Once 12/17/19 1531 12/17/19 1848   12/17/19 1531  ceFAZolin (ANCEF) 2-4 GM/100ML-% IVPB    Note to Pharmacy: Rozanna Box   : cabinet override      12/17/19 1531 12/18/19 0344   12/16/19 2300  vancomycin (VANCOREADY) IVPB 1250 mg/250 mL  Status:  Discontinued     1,250 mg 166.7 mL/hr over 90 Minutes Intravenous Every 24 hours 12/15/19 2302 12/18/19 0725   12/16/19 0900  piperacillin-tazobactam (ZOSYN) IVPB 3.375 g     3.375 g 12.5 mL/hr over 240 Minutes Intravenous Every 8 hours 12/16/19 0528     12/15/19 2300  vancomycin (VANCOCIN) IVPB 1000 mg/200 mL premix  Status:  Discontinued     1,000 mg 200 mL/hr over 60 Minutes Intravenous  Once 12/15/19 2135 12/15/19 2244   12/15/19 2245  vancomycin (VANCOREADY) IVPB 2000 mg/400 mL     2,000 mg 200 mL/hr over 120 Minutes Intravenous  Once 12/15/19 2244 12/16/19 0104   12/15/19 2245  piperacillin-tazobactam (ZOSYN) IVPB 3.375 g  Status:  Discontinued     3.375 g 12.5 mL/hr over 240 Minutes Intravenous Every 8 hours 12/15/19 2244 12/16/19 0528   12/15/19 2230  piperacillin-tazobactam (ZOSYN) IVPB 3.375 g  Status:  Discontinued     3.375 g 100 mL/hr over 30 Minutes Intravenous  Once 12/15/19 2227 12/15/19 2244   12/15/19 2230  vancomycin (VANCOCIN) IVPB 1000 mg/200 mL premix  Status:  Discontinued     1,000 mg 200 mL/hr over 60 Minutes Intravenous  Once 12/15/19 2227 12/15/19 2244   12/15/19 2130  piperacillin-tazobactam  (ZOSYN) IVPB 3.375 g  Status:  Discontinued     3.375 g 100 mL/hr over 30 Minutes Intravenous  Once 12/15/19 2120 12/15/19 2244   12/15/19 2130  vancomycin (VANCOCIN) IVPB 1000 mg/200 mL premix  Status:  Discontinued     1,000 mg 200 mL/hr over 60 Minutes Intravenous  Once 12/15/19 2120 12/15/19 2244       Medication:  . aspirin EC  81 mg Oral Daily  . baclofen  5 mg Oral QHS  . carvedilol  3.125 mg Oral BID WC  . chlorhexidine  60 mL Topical Once  . donepezil  5 mg Oral Daily  .  DULoxetine  30 mg Oral Daily  . heparin injection (subcutaneous)  5,000 Units Subcutaneous Q8H  . insulin aspart  0-15 Units Subcutaneous TID AC & HS  . insulin glargine  60 Units Subcutaneous BID  . mirabegron ER  25 mg Oral Daily  . mupirocin ointment  1 application Nasal BID  . povidone-iodine  2 application Topical Once  . ramipril  5 mg Oral Daily  . simvastatin  40 mg Oral q morning - 10a    acetaminophen **OR** acetaminophen, HYDROmorphone (DILAUDID) injection, magnesium hydroxide, morphine injection, ondansetron **OR** ondansetron (ZOFRAN) IV, ondansetron (ZOFRAN) IV, traZODone   Objective:   Vitals:   12/17/19 1820 12/17/19 1953 12/18/19 0012 12/18/19 0739  BP: 135/64  132/60 (!) 154/45  Pulse: 63  67 70  Resp: 16  17 18   Temp: 97.8 F (36.6 C) 98.6 F (37 C) 98.6 F (37 C) 98.1 F (36.7 C)  TempSrc: Oral Oral Oral Oral  SpO2: 96%  96% 94%  Weight:      Height:        Intake/Output Summary (Last 24 hours) at 12/18/2019 1303 Last data filed at 12/18/2019 0335 Gross per 24 hour  Intake 2057.25 ml  Output --  Net 2057.25 ml   Filed Weights   12/15/19 1724  Weight: 122.5 kg     Examination:     Physical Exam  Constitution:  Alert, cooperative, no distress,  Psychiatric: Poor short-term memory, otherwise stable mood  Mild cognitive deficit HEENT: Normocephalic, PERRL, otherwise with in Normal limits  Chest:Chest symmetric Cardio vascular:  S1/S2, RRR, No murmure, No  Rubs or Gallops  pulmonary: Clear to auscultation bilaterally, respirations unlabored, negative wheezes / crackles Abdomen: Soft, non-tender, non-distended, bowel sounds,no masses, no organomegaly Muscular skeletal: Limited exam - in bed, able to move all 4 extremities, Normal strength,  Neuro: CNII-XII intact. , normal motor and sensation, reflexes intact  Extremities: No pitting edema lower extremities, +2 pulses  Skin: Dry, warm to touch, negative for any Rashes, left foot wound/ulcer  wounds: Left foot, dressing in place        LABs:  CBC Latest Ref Rng & Units 12/18/2019 12/16/2019 12/15/2019  WBC 4.0 - 10.5 K/uL 7.1 7.4 7.4  Hemoglobin 12.0 - 15.0 g/dL 13.8 13.4 14.2  Hematocrit 36.0 - 46.0 % 41.3 39.2 42.7  Platelets 150 - 400 K/uL 295 225 241   CMP Latest Ref Rng & Units 12/18/2019 12/17/2019 12/16/2019  Glucose 70 - 99 mg/dL 217(H) 231(H) 147(H)  BUN 8 - 23 mg/dL 14 20 25(H)  Creatinine 0.44 - 1.00 mg/dL 0.92 0.99 1.09(H)  Sodium 135 - 145 mmol/L 137 138 138  Potassium 3.5 - 5.1 mmol/L 4.0 4.7 4.2  Chloride 98 - 111 mmol/L 106 106 105  CO2 22 - 32 mmol/L 25 25 26   Calcium 8.9 - 10.3 mg/dL 8.0(L) 8.3(L) 8.2(L)  Total Protein 6.5 - 8.1 g/dL - - -  Total Bilirubin 0.3 - 1.2 mg/dL - - -  Alkaline Phos 38 - 126 U/L - - -  AST 15 - 41 U/L - - -  ALT 0 - 44 U/L - - -        SIGNED: Deatra James, MD, FACP, FHM. [facility],  Pager [phone] ([email or web address] [email or web address])  If 7PM-7AM, please contact night-coverage [email or web address] 12/18/2019, 1:03 PM

## 2019-12-18 NOTE — Progress Notes (Signed)
Pharmacy Antibiotic Note  Judith Shaw is a 69 y.o. female admitted on 12/15/2019 with osteomyelitis.  Pharmacy has been consulted for vanc/zosyn dosing.  Plan: Patient received vanc 2g IV x 1  Will change from Vancomycin 1250 mg IV Q 24 hrs to  Vancomycin 1500 mg IV Q 24 hrs.  Goal AUC 400-550. Expected AUC: 515 SCr used: 0.92  Will continue zosyn 3.375g IV q8h per CrCl > 20 ml/min and will continue to monitor.  Height: 5\' 5"  (165.1 cm) Weight: 122.5 kg (270 lb) IBW/kg (Calculated) : 57  Temp (24hrs), Avg:98 F (36.7 C), Min:97.5 F (36.4 C), Max:98.6 F (37 C)  Recent Labs  Lab 12/15/19 1733 12/15/19 2117 12/16/19 0436 12/17/19 0513 12/18/19 0505  WBC 7.4  --  7.4  --  7.1  CREATININE 0.99  --  1.09* 0.99 0.92  LATICACIDVEN 2.1* 2.0*  --   --   --     Estimated Creatinine Clearance: 75.8 mL/min (by C-G formula based on SCr of 0.92 mg/dL).    Allergies  Allergen Reactions  . Codeine     WCx 4/20 >> Abundant GPC, few GNR's  MRSA PCR surgical screen +  Thank you for allowing pharmacy to be a part of this patient's care.  Lu Duffel, PharmD, BCPS Clinical Pharmacist 12/18/2019 7:27 AM

## 2019-12-19 ENCOUNTER — Encounter
Admission: EM | Disposition: A | Discharge status: Skilled Nursing Facility | Payer: Self-pay | Source: Home / Self Care | Attending: Student

## 2019-12-19 ENCOUNTER — Encounter: Payer: Self-pay | Admitting: Family Medicine

## 2019-12-19 ENCOUNTER — Other Ambulatory Visit: Payer: Self-pay

## 2019-12-19 ENCOUNTER — Inpatient Hospital Stay: Payer: Medicare PPO

## 2019-12-19 ENCOUNTER — Inpatient Hospital Stay: Payer: Medicare PPO | Admitting: Anesthesiology

## 2019-12-19 DIAGNOSIS — I5032 Chronic diastolic (congestive) heart failure: Secondary | ICD-10-CM | POA: Diagnosis not present

## 2019-12-19 DIAGNOSIS — M86172 Other acute osteomyelitis, left ankle and foot: Secondary | ICD-10-CM | POA: Diagnosis not present

## 2019-12-19 DIAGNOSIS — L97509 Non-pressure chronic ulcer of other part of unspecified foot with unspecified severity: Secondary | ICD-10-CM

## 2019-12-19 DIAGNOSIS — E11621 Type 2 diabetes mellitus with foot ulcer: Secondary | ICD-10-CM | POA: Diagnosis not present

## 2019-12-19 DIAGNOSIS — F0390 Unspecified dementia without behavioral disturbance: Secondary | ICD-10-CM | POA: Diagnosis not present

## 2019-12-19 HISTORY — PX: AMPUTATION: SHX166

## 2019-12-19 LAB — GLUCOSE, CAPILLARY
Glucose-Capillary: 211 mg/dL — ABNORMAL HIGH (ref 70–99)
Glucose-Capillary: 187 mg/dL — ABNORMAL HIGH (ref 70–99)
Glucose-Capillary: 168 mg/dL — ABNORMAL HIGH (ref 70–99)
Glucose-Capillary: 162 mg/dL — ABNORMAL HIGH (ref 70–99)
Glucose-Capillary: 201 mg/dL — ABNORMAL HIGH (ref 70–99)
Glucose-Capillary: 355 mg/dL — ABNORMAL HIGH (ref 70–99)

## 2019-12-19 LAB — AEROBIC/ANAEROBIC CULTURE W GRAM STAIN (SURGICAL/DEEP WOUND): Gram Stain: NONE SEEN

## 2019-12-19 LAB — CREATININE, SERUM
Creatinine, Ser: 1.07 mg/dL — ABNORMAL HIGH (ref 0.44–1.00)
GFR calc Af Amer: >60 mL/min (ref >60)
GFR calc non Af Amer: 53 mL/min — ABNORMAL LOW (ref >60)

## 2019-12-19 IMAGING — DX DG FOOT 2V*L*
2 series · 2 of 2 positions shown · non-contrast
Comparison: [DATE]

CLINICAL DATA: Left fifth digit amputation

EXAM:
LEFT FOOT - 2 VIEW

[foot ap]
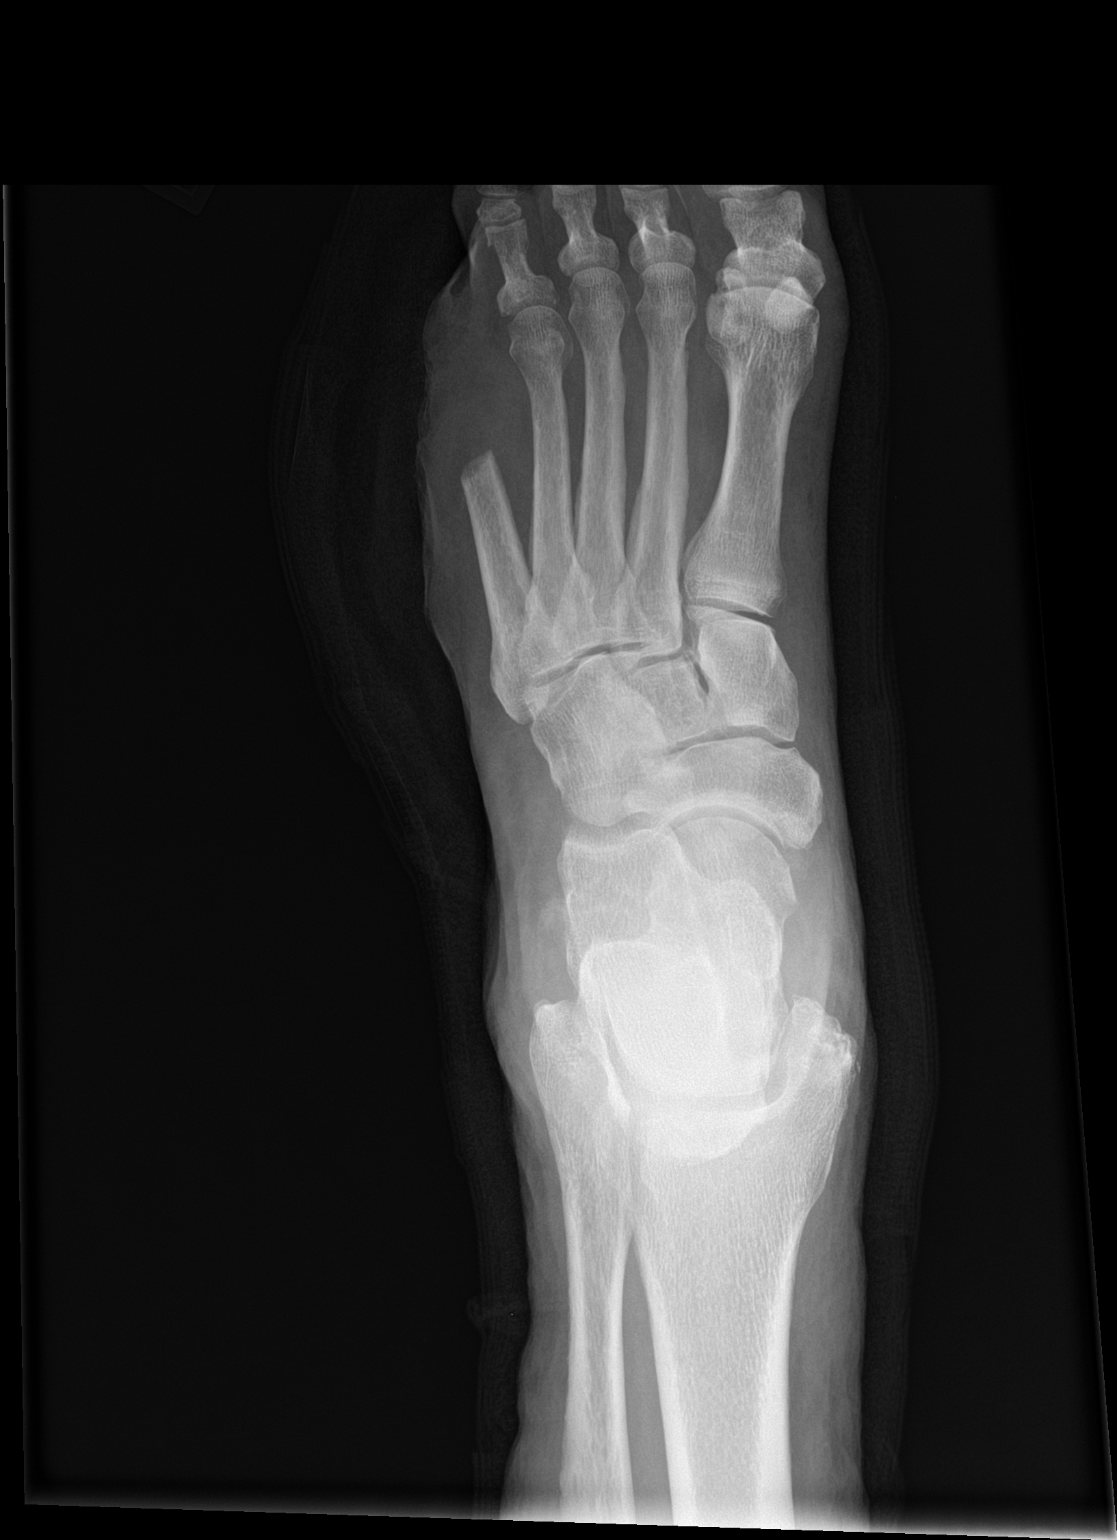

[foot lat]
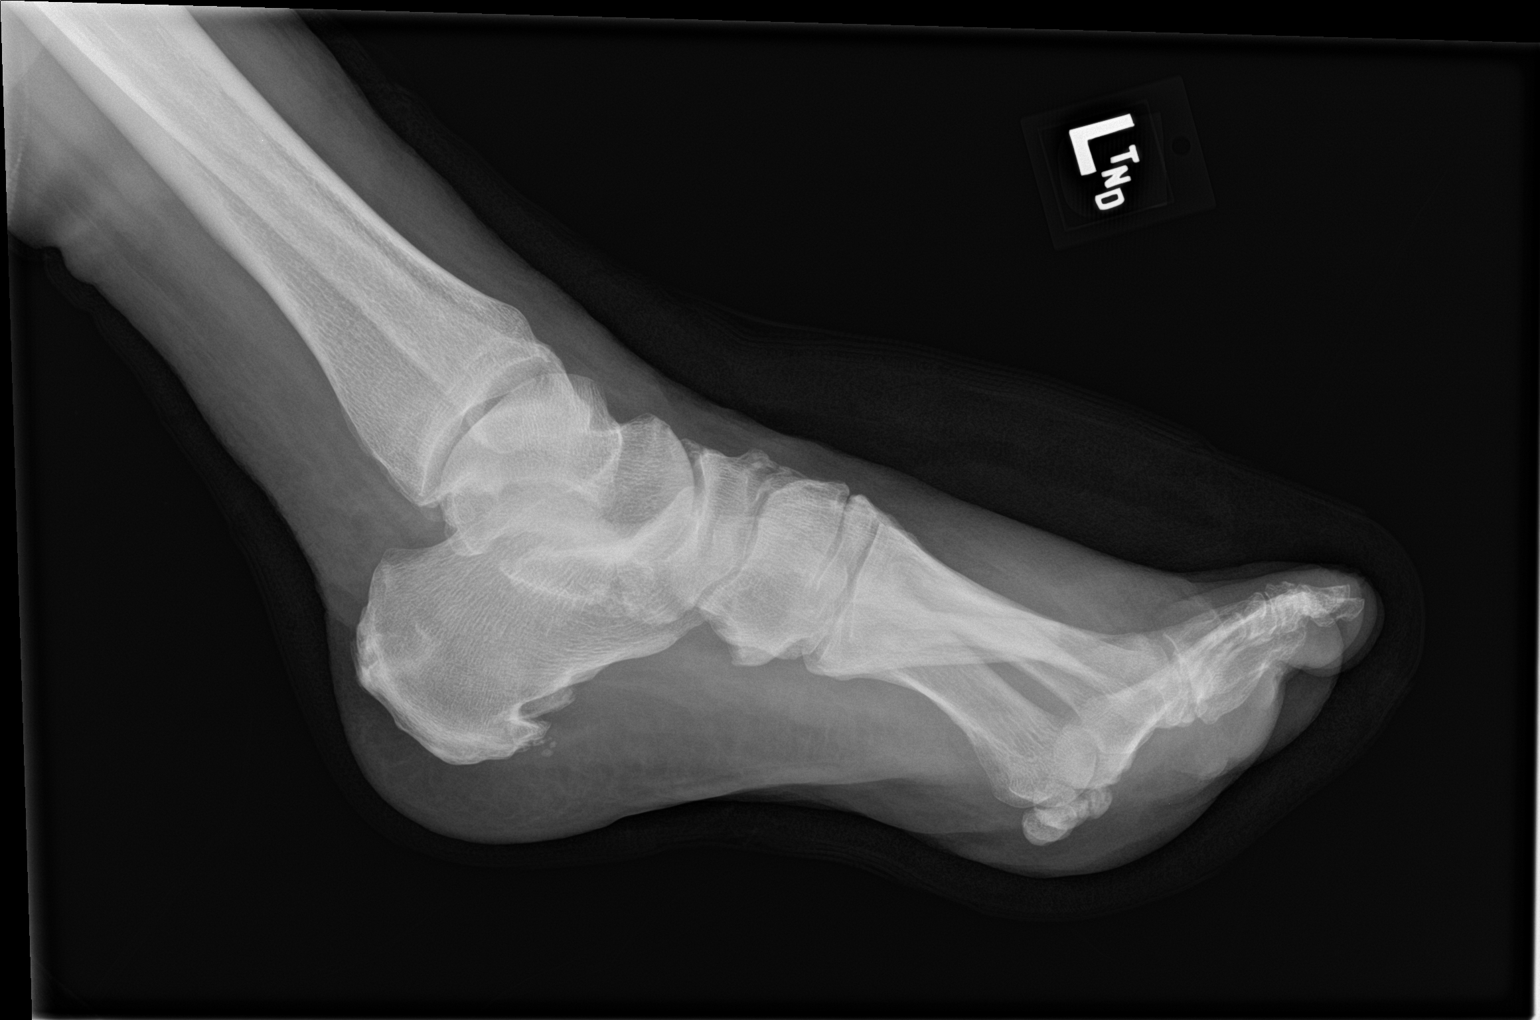

[2 of 2 positions shown; findings below may reference images not displayed]

FINDINGS: Frontal and lateral views of the left foot demonstrate interval
amputation distal aspect fifth metatarsal. Postsurgical changes are
seen in the soft tissues. Remaining bony structures are
unremarkable. Stable midfoot osteoarthritis and prominent calcaneal
spurs.
IMPRESSION: 1. Postsurgical changes amputation at the fifth metatarsal as above.

## 2019-12-19 SURGERY — AMPUTATION, FOOT, RAY
Anesthesia: General | Laterality: Left

## 2019-12-19 MED ORDER — NEOMYCIN-POLYMYXIN B GU 40-200000 IR SOLN
Status: DC | PRN
  Administered 2019-12-19: 2 mL

## 2019-12-19 MED ORDER — ASPIRIN EC 81 MG PO TBEC: 81.0000 mg | DELAYED_RELEASE_TABLET | Freq: Every day | ORAL | Status: DC

## 2019-12-19 MED ORDER — ACETAMINOPHEN 10 MG/ML IV SOLN
INTRAVENOUS | Status: AC
  Filled 2019-12-19: qty 100

## 2019-12-19 MED ORDER — LIDOCAINE HCL (CARDIAC) PF 100 MG/5ML IV SOSY
PREFILLED_SYRINGE | INTRAVENOUS | Status: DC | PRN
  Administered 2019-12-19: 100 mg via INTRAVENOUS

## 2019-12-19 MED ORDER — HEPARIN SODIUM (PORCINE) 5000 UNIT/ML IJ SOLN: 5000.0000 [IU] | Freq: Three times a day (TID) | INTRAMUSCULAR | Status: DC

## 2019-12-19 MED ORDER — DEXMEDETOMIDINE HCL 200 MCG/2ML IV SOLN
INTRAVENOUS | Status: DC | PRN
  Administered 2019-12-19 (×2): 20 ug via INTRAVENOUS

## 2019-12-19 MED ORDER — PROPOFOL 10 MG/ML IV BOLUS
INTRAVENOUS | Status: AC
  Filled 2019-12-19: qty 20

## 2019-12-19 MED ORDER — ACETAMINOPHEN 10 MG/ML IV SOLN: 1000.0000 mg | Freq: Once | INTRAVENOUS | Status: DC | PRN

## 2019-12-19 MED ORDER — FENTANYL CITRATE (PF) 100 MCG/2ML IJ SOLN: 25.0000 ug | INTRAMUSCULAR | Status: DC | PRN

## 2019-12-19 MED ORDER — MIDAZOLAM HCL 2 MG/2ML IJ SOLN
INTRAMUSCULAR | Status: DC | PRN
  Administered 2019-12-19 (×2): 1 mg via INTRAVENOUS

## 2019-12-19 MED ORDER — ONDANSETRON HCL 4 MG/2ML IJ SOLN
INTRAMUSCULAR | Status: DC | PRN
  Administered 2019-12-19: 4 mg via INTRAVENOUS

## 2019-12-19 MED ORDER — EPHEDRINE SULFATE 50 MG/ML IJ SOLN
INTRAMUSCULAR | Status: DC | PRN
  Administered 2019-12-19: 15 mg via INTRAVENOUS
  Administered 2019-12-19: 10 mg via INTRAVENOUS
  Administered 2019-12-19 (×2): 15 mg via INTRAVENOUS
  Administered 2019-12-19: 10 mg via INTRAVENOUS

## 2019-12-19 MED ORDER — MIDAZOLAM HCL 2 MG/2ML IJ SOLN
INTRAMUSCULAR | Status: AC
  Filled 2019-12-19: qty 2

## 2019-12-19 MED ORDER — KETAMINE HCL 50 MG/ML IJ SOLN
INTRAMUSCULAR | Status: AC
  Filled 2019-12-19: qty 10

## 2019-12-19 MED ORDER — ONDANSETRON HCL 4 MG/2ML IJ SOLN: 4.0000 mg | Freq: Once | INTRAMUSCULAR | Status: DC | PRN

## 2019-12-19 MED ORDER — DEXMEDETOMIDINE HCL IN NACL 80 MCG/20ML IV SOLN
INTRAVENOUS | Status: AC
  Filled 2019-12-19: qty 20

## 2019-12-19 MED ORDER — GLYCOPYRROLATE 0.2 MG/ML IJ SOLN
INTRAMUSCULAR | Status: DC | PRN
  Administered 2019-12-19: .2 mg via INTRAVENOUS

## 2019-12-19 MED ORDER — KETAMINE HCL 10 MG/ML IJ SOLN
INTRAMUSCULAR | Status: DC | PRN
  Administered 2019-12-19: 20 mg via INTRAVENOUS
  Administered 2019-12-19: 10 mg via INTRAVENOUS
  Administered 2019-12-19: 20 mg via INTRAVENOUS

## 2019-12-19 MED ORDER — EPHEDRINE 5 MG/ML INJ
INTRAVENOUS | Status: AC
  Filled 2019-12-19: qty 10

## 2019-12-19 MED ORDER — BUPIVACAINE HCL 0.5 % IJ SOLN
INTRAMUSCULAR | Status: DC | PRN
  Administered 2019-12-19: 20 mL

## 2019-12-19 MED ORDER — PROPOFOL 500 MG/50ML IV EMUL
INTRAVENOUS | Status: DC | PRN
  Administered 2019-12-19: 20 mg via INTRAVENOUS
  Administered 2019-12-19: 30 mg via INTRAVENOUS
  Administered 2019-12-19 (×6): 20 mg via INTRAVENOUS

## 2019-12-19 MED ORDER — ACETAMINOPHEN 10 MG/ML IV SOLN
INTRAVENOUS | Status: DC | PRN
  Administered 2019-12-19: 1000 mg via INTRAVENOUS

## 2019-12-19 MED ORDER — SODIUM CHLORIDE (PF) 0.9 % IJ SOLN
INTRAMUSCULAR | Status: AC
  Filled 2019-12-19: qty 10

## 2019-12-19 MED ORDER — PHENYLEPHRINE HCL (PRESSORS) 10 MG/ML IV SOLN
INTRAVENOUS | Status: DC | PRN
  Administered 2019-12-19 (×3): 100 ug via INTRAVENOUS
  Administered 2019-12-19 (×2): 200 ug via INTRAVENOUS
  Administered 2019-12-19: 100 ug via INTRAVENOUS
  Administered 2019-12-19: 200 ug via INTRAVENOUS
  Administered 2019-12-19: 100 ug via INTRAVENOUS

## 2019-12-19 SURGICAL SUPPLY — 50 items
BLADE MED AGGRESSIVE (BLADE) ×2 IMPLANT
BLADE OSC/SAGITTAL MD 5.5X18 (BLADE) IMPLANT
BLADE SURG 15 STRL LF DISP TIS (BLADE) IMPLANT
BLADE SURG 15 STRL SS (BLADE)
BLADE SURG MINI STRL (BLADE) IMPLANT
BNDG CONFORM 2 STRL LF (GAUZE/BANDAGES/DRESSINGS) IMPLANT
BNDG ELASTIC 4X5.8 VLCR STR LF (GAUZE/BANDAGES/DRESSINGS) ×2 IMPLANT
BNDG ESMARK 4X12 TAN STRL LF (GAUZE/BANDAGES/DRESSINGS) ×2 IMPLANT
BNDG GAUZE 4.5X4.1 6PLY STRL (MISCELLANEOUS) ×2 IMPLANT
CANISTER SUCT 1200ML W/VALVE (MISCELLANEOUS) ×2 IMPLANT
CNTNR SPEC 2.5X3XGRAD LEK (MISCELLANEOUS) ×1
CONT SPEC 4OZ STER OR WHT (MISCELLANEOUS) ×1
CONTAINER SPEC 2.5X3XGRAD LEK (MISCELLANEOUS) ×1 IMPLANT
COVER WAND RF STERILE (DRAPES) ×2 IMPLANT
CUFF TOURN 18 STER (MISCELLANEOUS) ×1 IMPLANT
CUFF TOURN DUAL PL 12 NO SLV (MISCELLANEOUS) IMPLANT
DRAPE FLUOR MINI C-ARM 54X84 (DRAPES) IMPLANT
DRESSING SURGICEL FIBRLLR 1X2 (HEMOSTASIS) IMPLANT
DRSG SURGICEL FIBRILLAR 1X2 (HEMOSTASIS) ×2
DURAPREP 26ML APPLICATOR (WOUND CARE) ×2 IMPLANT
ELECT REM PT RETURN 9FT ADLT (ELECTROSURGICAL) ×2
ELECTRODE REM PT RTRN 9FT ADLT (ELECTROSURGICAL) ×1 IMPLANT
GAUZE SPONGE 4X4 12PLY STRL (GAUZE/BANDAGES/DRESSINGS) ×4 IMPLANT
GAUZE XEROFORM 1X8 LF (GAUZE/BANDAGES/DRESSINGS) ×2 IMPLANT
GLOVE BIO SURGEON STRL SZ7 (GLOVE) ×2 IMPLANT
GLOVE INDICATOR 7.0 STRL GRN (GLOVE) ×2 IMPLANT
GOWN STRL REUS W/ TWL LRG LVL3 (GOWN DISPOSABLE) ×2 IMPLANT
GOWN STRL REUS W/TWL LRG LVL3 (GOWN DISPOSABLE) ×2
HANDPIECE VERSAJET DEBRIDEMENT (MISCELLANEOUS) IMPLANT
KIT TURNOVER KIT A (KITS) ×2 IMPLANT
LABEL OR SOLS (LABEL) ×2 IMPLANT
NDL FILTER BLUNT 18X1 1/2 (NEEDLE) ×1 IMPLANT
NDL HYPO 25X1 1.5 SAFETY (NEEDLE) ×2 IMPLANT
NEEDLE FILTER BLUNT 18X 1/2SAF (NEEDLE) ×1
NEEDLE FILTER BLUNT 18X1 1/2 (NEEDLE) ×1 IMPLANT
NEEDLE HYPO 25X1 1.5 SAFETY (NEEDLE) ×4 IMPLANT
NS IRRIG 500ML POUR BTL (IV SOLUTION) ×2 IMPLANT
PACK EXTREMITY (MISCELLANEOUS) ×2 IMPLANT
SOL .9 NS 3000ML IRR  AL (IV SOLUTION)
SOL .9 NS 3000ML IRR UROMATIC (IV SOLUTION) IMPLANT
SOL PREP PVP 2OZ (MISCELLANEOUS) ×2
SOLUTION PREP PVP 2OZ (MISCELLANEOUS) ×1 IMPLANT
SPONGE LAP 18X18 RF (DISPOSABLE) ×1 IMPLANT
STOCKINETTE STRL 6IN 960660 (GAUZE/BANDAGES/DRESSINGS) ×2 IMPLANT
SUT ETHILON 3-0 FS-10 30 BLK (SUTURE) ×4
SUT VIC AB 3-0 SH 27 (SUTURE) ×2
SUT VIC AB 3-0 SH 27X BRD (SUTURE) ×1 IMPLANT
SUTURE EHLN 3-0 FS-10 30 BLK (SUTURE) ×2 IMPLANT
SWAB CULTURE AMIES ANAERIB BLU (MISCELLANEOUS) IMPLANT
SYR 10ML LL (SYRINGE) ×2 IMPLANT

## 2019-12-19 NOTE — Op Note (Signed)
PODIATRY / FOOT AND ANKLE SURGERY OPERATIVE REPORT    SURGEON: Caroline More, DPM  PRE-OPERATIVE DIAGNOSIS:  1.  Left fifth metatarsal phalangeal joint osteomyelitis 2.  Left fifth metatarsal phalangeal joint ulceration with necrosis of bone 3.  Left foot cellulitis 4.  Diabetes type 2 polyneuropathy  POST-OPERATIVE DIAGNOSIS: Same  PROCEDURE(S): 1. Left partial fifth ray amputation  HEMOSTASIS: Left ankle tourniquet  ANESTHESIA: MAC  ESTIMATED BLOOD LOSS: 50 cc  FINDING(S): 1.  Left fifth metatarsal phalangeal joint erosion consistent with osteomyelitic changes  PATHOLOGY/SPECIMEN(S): Left partial fifth ray amputation pathology specimen with proximal margin of metatarsal distal shaft marked in purple ink.  Left fifth metatarsal phalangeal joint bone culture  INDICATIONS:   Judith Shaw is a 69 y.o. female who presents with a nonhealing ulceration to the lateral aspect of the fifth metatarsal phalangeal joint.  Patient was seen in clinic earlier in the week due to evidence of osteomyelitis on x-ray to the fifth metatarsal phalangeal joint and a wound that probes to bone at the lateral aspect of the joint.  Patient also has corresponding cellulitic changes to the left foot in which she has been treated with outpatient antibiotics with but it continues to recur.  Patient was subsequently admitted to the hospital for further work-up and treatment of infection.  Vascular team perform revascularization procedure to the left lower extremity successfully and patient has optimal blood flow for healing amputation at this time.  Discussed all treatment options with the patient and patient's family both conservative and surgical attempts at correction including potential risks and complications of surgical intervention at this time they have elected for procedure consisting of left partial fifth ray amputation..  DESCRIPTION: After obtaining full informed written consent, the patient was brought  back to the operating room and placed supine upon the operating table.  The patient received IV antibiotics prior to induction.  20 cc of half percent Marcaine plain was injected about the left fifth ray in a reverse Mayo type block fashion.  After obtaining adequate anesthesia, the patient was prepped and draped in the standard fashion.  An Esmarch bandage was used to exsanguinate the patient's left lower extremity and pneumatic ankle tourniquet was inflated.  Attention was then directed to the left fifth metatarsal phalangeal joint where a incision was started at the lateral aspect of the fifth metatarsal distal shaft and extended around the ulceration and made into a racquet around the fifth toe at the base the proximal phalanx.  The incision was made straight to bone.  At this time the fifth metatarsal phalangeal joint was identified and extensor tenotomy capsulotomy was performed followed by release the collateral and suspensory ligaments as well as the plantar plate and flexor tendon.  The fifth toe was disarticulated and passed off the operative site.  A bone culture was taken from the lateral base of the proximal phalanx of the fifth toe and sent off.  Circumferential dissection was then performed around the fifth metatarsal phalangeal joint to the distal shaft area.  At this time a sagittal bone saw was used to resect the distal fifth metatarsal with the appropriate beveling.  The distal metatarsal was passed off the operative site and the proximal margin was marked in purple ink and sent off for pathology along with the rest of the fifth toe.  The extensor and flexor tendons as well as the plantar plate were resected and passed off in the operative site.  The surgical site was flushed with copious amounts  normal sterile saline.    The pneumatic ankle tourniquet was deflated after that time and any bleeders were cauterized as necessary.  There appeared to be a decent amount of bleeding during this  process so Surgicel was packed into the wound site.  Hemostasis appeared to be well achieved.  The subcutaneous tissue was then reapproximated well coapted with 3-0 Vicryl and the skin was then reapproximated well coapted with combination of 3-0 and 4-0 nylon.  A postoperative dressing was then applied consisting of Xeroform followed by 4 x 4 gauze, ABD x2, Kerlix x2, and Ace wrap.  Patient tolerated the procedure and anesthesia well was transferred to recovery room vital signs stable vascular status intact to all remaining toes left foot.  The patient be discharged back to the inpatient floor after the procedure for further monitoring and care.  We will continue to follow-up with patient till discharge with appropriate follow-up.  COMPLICATIONS: None  CONDITION: Good, stable  Caroline More, DPM

## 2019-12-19 NOTE — Progress Notes (Signed)
Pharmacy Antibiotic Note  Judith Shaw is a 69 y.o. female admitted on 12/15/2019 with osteomyelitis.  Pharmacy has been consulted for vanc/zosyn dosing.  Plan: Patient received vanc 2g IV x 1  Will change from Vancomycin 1500 mg IV Q 24 hrs to  Vancomycin 1250 mg IV Q 24 hrs.  Goal AUC 400-550. Expected AUC: 492 SCr used: 1.07  Will continue zosyn 3.375g IV q8h per CrCl > 20 ml/min and will continue to monitor.  Further changes to abx will be reserved for after podiatry procedure is complete  Height: 5\' 5"  (165.1 cm) Weight: 122.5 kg (270 lb) IBW/kg (Calculated) : 57  Temp (24hrs), Avg:98.3 F (36.8 C), Min:97.7 F (36.5 C), Max:99.1 F (37.3 C)  Recent Labs  Lab 12/15/19 1733 12/15/19 2117 12/16/19 0436 12/17/19 0513 12/18/19 0505 12/19/19 0351  WBC 7.4  --  7.4  --  7.1  --   CREATININE 0.99  --  1.09* 0.99 0.92 1.07*  LATICACIDVEN 2.1* 2.0*  --   --   --   --     Estimated Creatinine Clearance: 65.2 mL/min (A) (by C-G formula based on SCr of 1.07 mg/dL (H)).    Allergies  Allergen Reactions  . Codeine     WCx 4/20 >> Abundant GPC (streptococcus), few GNR's  MRSA PCR surgical screen +  Thank you for allowing pharmacy to be a part of this patient's care.  Lu Duffel, PharmD, BCPS Clinical Pharmacist 12/19/2019 12:25 PM

## 2019-12-19 NOTE — Progress Notes (Addendum)
Inpatient Diabetes Program Recommendations  AACE/ADA: New Consensus Statement on Inpatient Glycemic Control (2015)  Target Ranges:  Prepandial:   less than 140 mg/dL      Peak postprandial:   less than 180 mg/dL (1-2 hours)      Critically ill patients:  140 - 180 mg/dL   Results for Judith Shaw, Judith Shaw (MRN [id number]) as of 12/19/2019 07:05  Ref. Range 12/18/2019 09:35 12/18/2019 11:20 12/18/2019 12:10 12/18/2019 15:44 12/18/2019 21:45  Glucose-Capillary Latest Ref Range: 70 - 99 mg/dL 281 (H)  8 units NOVOLOG +  60 units LANTUS  280 (H) 254 (H) 318 (H)  11 units NOVOLOG  322 (H)  11 units NOVOLOG +  60 units LANTUS   Results for Judith Shaw, Judith Shaw (MRN [id number]) as of 12/19/2019 09:53  Ref. Range 12/19/2019 07:53  Glucose-Capillary Latest Ref Range: 70 - 99 mg/dL 211 (H)  5 units NOVOLOG      Home DM Meds: Tresiba 120 units BID                             Humalog 60 units TID with meals                             Humalog 5 units for every 50 mg/dl above Target CBG of 150 mg/dl                             Metformin 500 mg Daily  Current Orders: Lantus 60 units BID      Novolog Moderate Correction Scale/ SSI (0-15 units) TID AC + HS      MD- Please consider the following in-hospital insulin adjustments:  1. Increase Lantus to 70 units BID   2. Start Novolog Meal Coverage: Novolog 4 units TID with meals  (Please add the following Hold Parameters: Hold if pt eats <50% of meal, Hold if pt NPO)      --Will follow patient during hospitalization--  Wyn Quaker RN, MSN, CDE Diabetes Coordinator Inpatient Glycemic Control Team Team Pager: [phone] (8a-5p)

## 2019-12-19 NOTE — Progress Notes (Signed)
1        PROGRESS NOTE    Patient: Judith Shaw                            PCP: Kirk Ruths, MD                    DOB: 09-14-1950            DOA: 12/15/2019 [id number]             DOS: 12/19/2019, 1:28 PM   LOS: 4 days   Date of Service: The patient was seen and examined on 12/19/2019  Subjective:   The patient was seen and examined this morning, stable no acute distress. P.o. Anticipated patient to go to the OR today for possible I&D and partial amputation Her blood sugars are running high. No complaints  Brief Narrative:   Ms. Juanelle Desai. Ising is a 82-yo F with PMH of HTN, uncontrolled DM 2, chronic diastolic CHF, presented to ED with nonhealing chronic left foot wound, diabetic ulcers Work-up revealed patient has osteomyelitis, started on IV antibiotics Traced and vascular team was consulted. The patient completed arteriogram on 12/17/2019, and   pursued intraoperative amputation on 12/19/2019  Assessment & Plan:   Active Problems:   HTN (hypertension)   Diabetes ([place])   Hyperlipidemia   Chronic diastolic CHF (congestive heart failure) (HCC)   Morbid obesity (HCC)   Sleep apnea   Acute osteomyelitis of left foot (HCC)   Dementia without behavioral disturbance (HCC)   Left foot wound with osteomyelitis.   -Remained stable afebrile normotensive -N.p.o. overnight -Podiatry following and plans on left fifth partial ray amputation on 12/19/19.   -Vascular surgery following,  - Status post arteriogram 12/17/2019 of left lower extremity--ulcerated well Report reviewed  Diabetes mellitus type II - - She is chronically on Tresiba 120 units twice daily at home,  pharmacy consulted, switched to Lantus --titrating with increased dose -As blood sugars are creeping up,  -Last CBGs  :318 this morning, 322, 211, 187 -We will continue with CBG QA CHS, with SSI - A1c 11.3.   - Hold Metformin while in hospital  Hypertension.    - Stable, continue on carvedilol and  ramipril  Chronic diastolic congestive heart failure.  -Appears stable, compensated, monitoring daily weights, I's and O's -Continue to hold Lasix for now - Continue on beta-blockers.  Hyperlipidemia -  Continue statin Obstructive sleep apnea.  We will continue on CPAP. Morbid obesity.  Would benefit from weight loss.  Dementia.  This is a very recent diagnosis.  She was started on Aricept. Poor short-term memory.   Nutritional status:          Consultants:   Podiatry  Vascular surgery  Procedures:  Arteriogram on 12/17/2019,    left fifth partial ray amputation on 12/19/19.  Antimicrobials:   Vancomycin 4/19 >  Zosyn 4/19 >    -------------------------------------------------------------------------------------------------------------------------------- DVT prophylaxis: Heparin SQ Code Status:   Code Status: Full Code Family Communication: No family member present at bedside - attempt will be made to update daily speak to [facility], her Sister Abe People [phone]) The above findings and plan of care has been discussed with patient (and family )  in detail,  they expressed understanding and agreement of above. -Advance care planning has been discussed.   Admission status:   Status is: Inpatient  Remains inpatient appropriate because: IV treatments appropriate due to intensity of  illness  Dispo: The patient is from: Home              Anticipated d/c is to: Likely SNF              Anticipated d/c date is: > 3 days likely Monday  Discussed with social work updates per sister daily plan is to proceed with amputation Friday, 12/19/2019, then to determine IV antibiotics versus p.o., followed by evaluation by PT OT,  subsequently will be determined if she needs to go to SNF-sister is agreeable with plan.                Patient currently is not medically stable to d/c.  Convention, IV antibiotics, amputation  vascular work-up     Procedures:   No admission  procedures for hospital encounter.   Arteriogram 12/17/2019 --- reviewed  Intraoperative surgical Amputation 12/19/2019 >>   Antimicrobials:  Anti-infectives (From admission, onward)   Start     Dose/Rate Route Frequency Ordered Stop   12/18/19 2200  [MAR Hold]  vancomycin (VANCOREADY) IVPB 1500 mg/300 mL     (MAR Hold since Fri 12/19/2019 at 1312.Hold Reason: Transfer to a Procedural area.)   1,500 mg 150 mL/hr over 120 Minutes Intravenous Every 24 hours 12/18/19 0725     12/17/19 1545  ceFAZolin (ANCEF) IVPB 2g/100 mL premix  Status:  Discontinued    Note to Pharmacy: To be given in specials   2 g 200 mL/hr over 30 Minutes Intravenous  Once 12/17/19 1531 12/17/19 1848   12/17/19 1531  ceFAZolin (ANCEF) 2-4 GM/100ML-% IVPB    Note to Pharmacy: Rozanna Box   : cabinet override      12/17/19 1531 12/18/19 0344   12/16/19 2300  vancomycin (VANCOREADY) IVPB 1250 mg/250 mL  Status:  Discontinued     1,250 mg 166.7 mL/hr over 90 Minutes Intravenous Every 24 hours 12/15/19 2302 12/18/19 0725   12/16/19 0900  [MAR Hold]  piperacillin-tazobactam (ZOSYN) IVPB 3.375 g     (MAR Hold since Fri 12/19/2019 at 1312.Hold Reason: Transfer to a Procedural area.)   3.375 g 12.5 mL/hr over 240 Minutes Intravenous Every 8 hours 12/16/19 0528     12/15/19 2300  vancomycin (VANCOCIN) IVPB 1000 mg/200 mL premix  Status:  Discontinued     1,000 mg 200 mL/hr over 60 Minutes Intravenous  Once 12/15/19 2135 12/15/19 2244   12/15/19 2245  vancomycin (VANCOREADY) IVPB 2000 mg/400 mL     2,000 mg 200 mL/hr over 120 Minutes Intravenous  Once 12/15/19 2244 12/16/19 0104   12/15/19 2245  piperacillin-tazobactam (ZOSYN) IVPB 3.375 g  Status:  Discontinued     3.375 g 12.5 mL/hr over 240 Minutes Intravenous Every 8 hours 12/15/19 2244 12/16/19 0528   12/15/19 2230  piperacillin-tazobactam (ZOSYN) IVPB 3.375 g  Status:  Discontinued     3.375 g 100 mL/hr over 30 Minutes Intravenous  Once 12/15/19 2227 12/15/19 2244    12/15/19 2230  vancomycin (VANCOCIN) IVPB 1000 mg/200 mL premix  Status:  Discontinued     1,000 mg 200 mL/hr over 60 Minutes Intravenous  Once 12/15/19 2227 12/15/19 2244   12/15/19 2130  piperacillin-tazobactam (ZOSYN) IVPB 3.375 g  Status:  Discontinued     3.375 g 100 mL/hr over 30 Minutes Intravenous  Once 12/15/19 2120 12/15/19 2244   12/15/19 2130  vancomycin (VANCOCIN) IVPB 1000 mg/200 mL premix  Status:  Discontinued     1,000 mg 200 mL/hr over 60 Minutes Intravenous  Once  12/15/19 2120 12/15/19 2244       Medication:  . [MAR Hold] aspirin EC  81 mg Oral Daily  . [MAR Hold] baclofen  5 mg Oral QHS  . [MAR Hold] carvedilol  3.125 mg Oral BID WC  . [MAR Hold] donepezil  5 mg Oral Daily  . [MAR Hold] DULoxetine  30 mg Oral Daily  . [MAR Hold] heparin injection (subcutaneous)  5,000 Units Subcutaneous Q8H  . [MAR Hold] insulin aspart  0-15 Units Subcutaneous TID AC & HS  . [MAR Hold] insulin glargine  60 Units Subcutaneous BID  . [MAR Hold] mirabegron ER  25 mg Oral Daily  . [MAR Hold] mupirocin ointment  1 application Nasal BID  . povidone-iodine  2 application Topical Once  . [MAR Hold] ramipril  5 mg Oral Daily  . [MAR Hold] simvastatin  40 mg Oral q morning - 10a    [MAR Hold] acetaminophen **OR** [MAR Hold] acetaminophen, [MAR Hold]  HYDROmorphone (DILAUDID) injection, [MAR Hold] magnesium hydroxide, [MAR Hold]  morphine injection, [MAR Hold] ondansetron **OR** [MAR Hold] ondansetron (ZOFRAN) IV, [MAR Hold] ondansetron (ZOFRAN) IV, [MAR Hold] traZODone   Objective:   Vitals:   12/19/19 0752 12/19/19 0858 12/19/19 1322 12/19/19 1324  BP: (!) 175/73 137/60 (!) 120/48   Pulse: 68 70 63   Resp: 20 18 17    Temp: 98.1 F (36.7 C)  (!) 97.1 F (36.2 C)   TempSrc: Oral  Tympanic   SpO2: 93% 94% 93%   Weight:    122.5 kg  Height:    5\' 5"  (1.651 m)    Intake/Output Summary (Last 24 hours) at 12/19/2019 1328 Last data filed at 12/19/2019 0600 Gross per 24 hour   Intake 882.23 ml  Output --  Net 882.23 ml   Filed Weights   12/15/19 1724 12/19/19 1324  Weight: 122.5 kg 122.5 kg     Examination:   Physical Exam  Constitution:  Alert, cooperative, no distress,  Psychiatric: Normal and stable mood and affect, cognition intact,   HEENT: Normocephalic, PERRL, otherwise with in Normal limits  Chest:Chest symmetric Cardio vascular:  S1/S2, RRR, No murmure, No Rubs or Gallops  pulmonary: Clear to auscultation bilaterally, respirations unlabored, negative wheezes / crackles Abdomen: Soft, non-tender, non-distended, bowel sounds,no masses, no organomegaly Muscular skeletal: Limited exam - in bed, able to move all 4 extremities, Normal strength,  Neuro: CNII-XII intact. , normal motor and sensation, reflexes intact  Extremities: No pitting edema lower extremities, +2 pulses  Skin: Dry, warm to touch, negative for any Rashes, left foot wound/ulcer  wounds: Left foot, dressing in place        LABs:  CBC Latest Ref Rng & Units 12/18/2019 12/16/2019 12/15/2019  WBC 4.0 - 10.5 K/uL 7.1 7.4 7.4  Hemoglobin 12.0 - 15.0 g/dL 13.8 13.4 14.2  Hematocrit 36.0 - 46.0 % 41.3 39.2 42.7  Platelets 150 - 400 K/uL 295 225 241   CMP Latest Ref Rng & Units 12/19/2019 12/18/2019 12/17/2019  Glucose 70 - 99 mg/dL - 217(H) 231(H)  BUN 8 - 23 mg/dL - 14 20  Creatinine 0.44 - 1.00 mg/dL 1.07(H) 0.92 0.99  Sodium 135 - 145 mmol/L - 137 138  Potassium 3.5 - 5.1 mmol/L - 4.0 4.7  Chloride 98 - 111 mmol/L - 106 106  CO2 22 - 32 mmol/L - 25 25  Calcium 8.9 - 10.3 mg/dL - 8.0(L) 8.3(L)  Total Protein 6.5 - 8.1 g/dL - - -  Total Bilirubin 0.3 - 1.2  mg/dL - - -  Alkaline Phos 38 - 126 U/L - - -  AST 15 - 41 U/L - - -  ALT 0 - 44 U/L - - -        SIGNED: Deatra James, MD, FACP, FHM. [facility],  Pager [phone] ([email or web address] [email or web address])  If 7PM-7AM, please contact night-coverage [email or web address] [email or web address] [facility] 12/19/2019, 1:27 PM

## 2019-12-19 NOTE — Plan of Care (Signed)
  Problem: Clinical Measurements: Goal: Ability to maintain clinical measurements within normal limits will improve Outcome: Progressing Goal: Will remain free from infection Outcome: Progressing Goal: Diagnostic test results will improve Outcome: Progressing Goal: Respiratory complications will improve Outcome: Progressing Goal: Cardiovascular complication will be avoided Outcome: Progressing   Problem: Activity: Goal: Risk for activity intolerance will decrease Outcome: Progressing   Problem: Coping: Goal: Level of anxiety will decrease Outcome: Progressing   Problem: Pain Managment: Goal: General experience of comfort will improve Outcome: Progressing   Problem: Safety: Goal: Ability to remain free from injury will improve Outcome: Progressing   

## 2019-12-19 NOTE — Transfer of Care (Signed)
Immediate Anesthesia Transfer of Care Note  Patient: Judith Shaw  Procedure(s) Performed: AMPUTATION RAY Left 5th (Left )  Patient Location: PACU  Anesthesia Type:General  Level of Consciousness: drowsy  Airway & Oxygen Therapy: Patient Spontanous Breathing and Patient connected to face mask oxygen  Post-op Assessment: Report given to RN and Post -op Vital signs reviewed and stable  Post vital signs: Reviewed and stable  Last Vitals:  Vitals Value Taken Time  BP 107/45 12/19/19 1506  Temp    Pulse 69 12/19/19 1511  Resp 18 12/19/19 1511  SpO2 98 % 12/19/19 1511  Vitals shown include unvalidated device data.  Last Pain:  Vitals:   12/19/19 1322  TempSrc: Tympanic  PainSc:          Complications: No apparent anesthesia complications

## 2019-12-19 NOTE — H&P (Signed)
HISTORY AND PHYSICAL INTERVAL NOTE:  12/19/2019  1:36 PM  Judith Shaw  has presented today for surgery, with the diagnosis of osteomyelitis fifth ray left.  The various methods of treatment have been discussed with the patient.  No guarantees were given.  After consideration of risks, benefits and other options for treatment, the patient has consented to surgery.  I have reviewed the patients' chart and labs.    PROCEDURE: LEFT PARTIAL 5TH RAY AMPUTATION  A history and physical examination was performed in my office.  The patient was reexamined.  There have been no changes to this history and physical examination.  Caroline More, DPM

## 2019-12-20 DIAGNOSIS — G4730 Sleep apnea, unspecified: Secondary | ICD-10-CM

## 2019-12-20 DIAGNOSIS — I10 Essential (primary) hypertension: Secondary | ICD-10-CM | POA: Diagnosis not present

## 2019-12-20 DIAGNOSIS — M86172 Other acute osteomyelitis, left ankle and foot: Secondary | ICD-10-CM | POA: Diagnosis not present

## 2019-12-20 DIAGNOSIS — I5032 Chronic diastolic (congestive) heart failure: Secondary | ICD-10-CM | POA: Diagnosis not present

## 2019-12-20 DIAGNOSIS — F0390 Unspecified dementia without behavioral disturbance: Secondary | ICD-10-CM | POA: Diagnosis not present

## 2019-12-20 LAB — GLUCOSE, CAPILLARY
Glucose-Capillary: 197 mg/dL — ABNORMAL HIGH (ref 70–99)
Glucose-Capillary: 227 mg/dL — ABNORMAL HIGH (ref 70–99)
Glucose-Capillary: 286 mg/dL — ABNORMAL HIGH (ref 70–99)
Glucose-Capillary: 254 mg/dL — ABNORMAL HIGH (ref 70–99)

## 2019-12-20 LAB — CREATININE, SERUM
Creatinine, Ser: 1.03 mg/dL — ABNORMAL HIGH (ref 0.44–1.00)
GFR calc Af Amer: >60 mL/min (ref >60)
GFR calc non Af Amer: 55 mL/min — ABNORMAL LOW (ref >60)

## 2019-12-20 MED ORDER — INSULIN ASPART 100 UNIT/ML ~~LOC~~ SOLN
0.0000 [IU] | Freq: Three times a day (TID) | SUBCUTANEOUS | Status: DC
  Administered 2019-12-20: 8 [IU] via SUBCUTANEOUS
  Administered 2019-12-20: 5 [IU] via SUBCUTANEOUS
  Administered 2019-12-20: 3 [IU] via SUBCUTANEOUS
  Filled 2019-12-20 (×3): qty 1

## 2019-12-20 MED ORDER — INSULIN ASPART 100 UNIT/ML ~~LOC~~ SOLN
6.0000 [IU] | Freq: Three times a day (TID) | SUBCUTANEOUS | Status: DC
  Administered 2019-12-20 (×3): 6 [IU] via SUBCUTANEOUS
  Filled 2019-12-20 (×3): qty 1

## 2019-12-20 MED ORDER — OXYCODONE HCL 5 MG PO TABS
5.0000 mg | ORAL_TABLET | Freq: Four times a day (QID) | ORAL | Status: DC | PRN
  Administered 2019-12-20: 5 mg via ORAL
  Filled 2019-12-20: qty 1

## 2019-12-20 MED ORDER — ASPIRIN EC 81 MG PO TBEC
81.0000 mg | DELAYED_RELEASE_TABLET | Freq: Every day | ORAL | Status: DC
  Administered 2019-12-21 – 2019-12-24 (×4): 81 mg via ORAL
  Filled 2019-12-20 (×4): qty 1

## 2019-12-20 MED ORDER — SODIUM CHLORIDE 0.9 % IV SOLN
3.0000 g | Freq: Four times a day (QID) | INTRAVENOUS | Status: DC
  Administered 2019-12-20 – 2019-12-22 (×7): 3 g via INTRAVENOUS
  Filled 2019-12-20 (×4): qty 8
  Filled 2019-12-20 (×2): qty 3
  Filled 2019-12-20 (×4): qty 8
  Filled 2019-12-20: qty 3

## 2019-12-20 MED ORDER — INSULIN ASPART 100 UNIT/ML ~~LOC~~ SOLN
0.0000 [IU] | Freq: Every day | SUBCUTANEOUS | Status: DC
  Administered 2019-12-20: 3 [IU] via SUBCUTANEOUS
  Filled 2019-12-20: qty 1

## 2019-12-20 MED ORDER — HEPARIN SODIUM (PORCINE) 5000 UNIT/ML IJ SOLN
5000.0000 [IU] | Freq: Three times a day (TID) | INTRAMUSCULAR | Status: DC
  Administered 2019-12-20 – 2019-12-22 (×7): 5000 [IU] via SUBCUTANEOUS
  Filled 2019-12-20 (×7): qty 1

## 2019-12-20 MED ORDER — ACETAMINOPHEN 500 MG PO TABS
1000.0000 mg | ORAL_TABLET | Freq: Three times a day (TID) | ORAL | Status: DC
  Administered 2019-12-20 – 2019-12-24 (×9): 1000 mg via ORAL
  Filled 2019-12-20 (×11): qty 2

## 2019-12-20 MED ORDER — MORPHINE SULFATE (PF) 2 MG/ML IV SOLN
2.0000 mg | INTRAVENOUS | Status: DC | PRN
  Administered 2019-12-20: 2 mg via INTRAVENOUS
  Filled 2019-12-20: qty 1

## 2019-12-20 NOTE — Progress Notes (Signed)
Prior during HS medication, patient c/o pain to the same extremity, PRN hydromorphone was administered as per orders and ice applied for 10 minutes. Pt reported some relief. Family at bedside at that time.

## 2019-12-20 NOTE — Evaluation (Signed)
Physical Therapy Evaluation Patient Details Name: Judith Shaw MRN: [id number] DOB: 04/25/1951 Today's Date: 12/20/2019   History of Present Illness  Gerardo L Chevrier is a 69 y.o. female who presents with a nonhealing ulceration to the lateral aspect of the fifth metatarsal phalangeal joint.  Patient was seen in clinic earlier in the week due to evidence of osteomyelitis on x-ray to the fifth metatarsal phalangeal joint and a wound that probes to bone at the lateral aspect of the joint.  Patient also has corresponding cellulitic changes to the left foot in which she has been treated with outpatient antibiotics with but it continues to recur. Partial 5th ray amputation 12/19/19 with NWB precautions  Clinical Impression  Patient is very motivated to work with PT. Patient is able to complete supine > sit modI with HOB elevated. Able to complete STS with heavy cuing for precautions, minA needed at initiation of stand and is able to comply with cuing in standing for heel down without WB. Is able to take hop steps to chair following max cuing and demo after first attempt. Patient is able to comply with NWB precautions but is fatigued following. Would benefit from skilled PT to address above deficits and promote optimal return to PLOF.     Follow Up Recommendations Supervision/Assistance - 24 hour    Equipment Recommendations  Rolling walker with 5" wheels    Recommendations for Other Services       Precautions / Restrictions Precautions Required Braces or Orthoses: Other Brace Other Brace: post-op shoe Restrictions Weight Bearing Restrictions: Yes LLE Weight Bearing: Non weight bearing Other Position/Activity Restrictions: heel touch for transfers only      Mobility  Bed Mobility Overal bed mobility: Needs Assistance Bed Mobility: Supine to Sit     Supine to sit: HOB elevated;Min guard     General bed mobility comments: Cuing for hand placement for efficiency of  transfer  Transfers Overall transfer level: Needs assistance Equipment used: Rolling walker (2 wheeled) Transfers: Sit to/from Stand Sit to Stand: Min assist         General transfer comment: MinA needed for initiation, heavy cuing for precautions  Ambulation/Gait Ambulation/Gait assistance: Min guard Gait Distance (Feet): 4 Feet Assistive device: Rolling walker (2 wheeled) Gait Pattern/deviations: (hop) Gait velocity: decreased   General Gait Details: Pt with difficulty with NWB precautions, able to take multiple small hops following heavy cuing  Stairs            Wheelchair Mobility    Modified Rankin (Stroke Patients Only)       Balance                                             Pertinent Vitals/Pain Pain Assessment: Faces Faces Pain Scale: Hurts a little bit Pain Location: L toes Pain Intervention(s): Limited activity within patient's tolerance    Home Living Family/patient expects to be discharged to:: Private residence Living Arrangements: Alone Available Help at Discharge: Family Type of Home: House Home Access: Level entry     Home Layout: One level Home Equipment: Environmental consultant - 2 wheels;Cane - single point      Prior Function Level of Independence: Independent with assistive device(s)         Comments: uses SPC for all outside mobility, RW inside the home, reports no falls in past year     Hand Dominance  Dominant Hand: Right    Extremity/Trunk Assessment   Upper Extremity Assessment Upper Extremity Assessment: Overall WFL for tasks assessed    Lower Extremity Assessment Lower Extremity Assessment: Overall WFL for tasks assessed    Cervical / Trunk Assessment Cervical / Trunk Assessment: Normal  Communication   Communication: No difficulties  Cognition Arousal/Alertness: Awake/alert Behavior During Therapy: WFL for tasks assessed/performed Overall Cognitive Status: Within Functional Limits for tasks  assessed                                 General Comments: O X4      General Comments      Exercises Other Exercises Other Exercises: Supine > sit HOB elevated, able to complete with min cuing for hand placement Other Exercises: STS transfer with RW, heavy cuing for initiation of stand with patient mostly able to comply with NWB Other Exercises: Hop for NWB to chair, difficulty complying with precautions for first hop, but able to comply with further cuing for this to demonstrate carry over with many very small hops   Assessment/Plan    PT Assessment Patient needs continued PT services  PT Problem List Decreased strength;Decreased mobility;Decreased range of motion;Decreased activity tolerance;Decreased balance;Decreased safety awareness       PT Treatment Interventions DME instruction;Therapeutic activities;Gait training;Therapeutic exercise;Patient/family education;Stair training;Balance training;Functional mobility training;Neuromuscular re-education;Manual techniques    PT Goals (Current goals can be found in the Care Plan section)  Acute Rehab PT Goals Patient Stated Goal: go home PT Goal Formulation: With patient Time For Goal Achievement: 01/03/20 Potential to Achieve Goals: Fair    Frequency Min 2X/week   Barriers to discharge        Co-evaluation               AM-PAC PT "6 Clicks" Mobility  Outcome Measure Help needed turning from your back to your side while in a flat bed without using bedrails?: A Little Help needed moving from lying on your back to sitting on the side of a flat bed without using bedrails?: A Little Help needed moving to and from a bed to a chair (including a wheelchair)?: A Little Help needed standing up from a chair using your arms (e.g., wheelchair or bedside chair)?: A Little Help needed to walk in hospital room?: A Lot Help needed climbing 3-5 steps with a railing? : Total 6 Click Score: 15    End of Session  Equipment Utilized During Treatment: Gait belt Activity Tolerance: Patient tolerated treatment well Patient left: in chair;with call bell/phone within reach;with chair alarm set Nurse Communication: Mobility status PT Visit Diagnosis: Unsteadiness on feet (R26.81);Other abnormalities of gait and mobility (R26.89)    Time: [id number] PT Time Calculation (min) (ACUTE ONLY): 15 min   Charges:     PT Treatments $Therapeutic Activity: 8-22 mins        Shelton Silvas PT, DPT  Shelton Silvas 12/20/2019, 11:29 AM

## 2019-12-20 NOTE — Progress Notes (Signed)
PROGRESS NOTE  Judith Shaw [id number] DOB: 09-09-1950   PCP: Kirk Ruths, MD  Patient is from: Home.  Independently ambulates at baseline.  DOA: 12/15/2019 LOS: 5  Brief Narrative / Interim history: 69 year old female with history of HTN, uncontrolled DM-2, diastolic CHF, OSA and depression presented with left foot wound and diabetic ulcer and found to have osteomyelitis.  Started on IV antibiotics (vancomycin and Zosyn).  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   Subjective: Seen and examined earlier this morning.  No major events overnight of this morning.  Reports mild pain but tolerable.  No other complaints.  She denies chest pain, dyspnea, dizziness, GI or UTI symptoms.  Objective: Vitals:   12/19/19 1537 12/19/19 1627 12/20/19 0021 12/20/19 0749  BP: (!) 118/55 117/60 (!) 128/56 (!) 146/60  Pulse: 70  62 75  Resp: 20 18 18 18   Temp: 98 F (36.7 C) 97.6 F (36.4 C) 97.7 F (36.5 C) 97.7 F (36.5 C)  TempSrc:  Oral Oral Oral  SpO2: 95% 97% 92% 100%  Weight:      Height:        Intake/Output Summary (Last 24 hours) at 12/20/2019 1117 Last data filed at 12/20/2019 0600 Gross per 24 hour  Intake 2343.2 ml  Output 410 ml  Net 1933.2 ml   Filed Weights   12/15/19 1724 12/19/19 1324  Weight: 122.5 kg 122.5 kg    Examination:  GENERAL: No apparent distress. Nontoxic.  HEENT: MMM.  Vision and hearing grossly intact.  NECK: Supple.  No apparent JVD.  RESP:  No IWOB. Good air movement bilaterally. CVS:  RRR. Heart sounds normal.  ABD/GI/GU: BS present. Soft. Non tender.  MSK/EXT:  Moves extremities. No apparent deformity. No edema.  Dressing over left foot DTI. SKIN: no apparent skin lesion or wound.  Dressing over left foot DTI. NEURO: Awake, alert and oriented appropriately.  No apparent focal neuro deficit. PSYCH: Calm. Normal affect.  Procedures:  4/21-angiography 4/23-left fifth ray amputation  Microbiology  summarized: 4/19-COVID-19 PCR negative. 4/21-MRSA PCR negative 4/20-tissue culture with Streptococcus anginosus. 4/23-tissue culture NGTD.   Assessment & Plan: Diabetic left wound infection with osteomyelitis -4/21-status post arteriogram -4/23-left fifth partial ray amputation -Tissue culture on 4/20 with Streptococcus urinalysis.  Tissue culture on 4/23 NGTD. -Continue IV Zosyn and vancomycin.  Will involve ID for antibiotics after culture speciation -Optimize blood glucose control. -Nonweightbearing except for heel contact for transfers only in surgical shoe -PT/OT eval  Uncontrolled DM-2 with hyperglycemia: A1c 11.3%.  Tresiba Metformin at home. Recent Labs    12/19/19 1750 12/19/19 2059 12/20/19 0744  GLUCAP 201* 355* 197*  -Continue moderate SSI.  Add nightly coverage. -Add NovoLog 6 units AC. -Continue Lantus 60 units twice daily. -Continue statin. -Could benefit from GLP-1 inhibitors given obesity  Essential hypertension: [place]. -Continue home carvedilol and ramipril  Chronic diastolic congestive heart failure: no echo in the system.  Appears euvolemic.  No cardiopulmonary symptoms. -Continue home medications -Monitor fluid status -Sodium and fluid restrictions.  Dementia without behavioral disturbance: Recent diagnosis.   -Continue home Aricept  -Frequent reorientation and delirium precautions.  History of depression: Stable. -Continue on Cymbalta.  Hyperlipidemia - Continue statin  Obstructive sleep apnea.  -Nightly CPAP  Morbid obesity: Body mass index is 44.94 kg/m. -Encourage lifestyle change to lose weight. -Could benefit from GLP-1 inhibitors in the setting of diabetes.  DVT prophylaxis: Subcu heparin Code Status: Full code Family Communication: Patient and/or RN. Available if any question.   Discharge barrier: Diabetic left foot infection with osteomyelitis on IV antibiotics. Patient is from: Home Final  disposition: To be determined after therapy evaluation.  Consultants:  Podiatry   Sch Meds:  Scheduled Meds: . aspirin EC  81 mg Oral Daily  . baclofen  5 mg Oral QHS  . carvedilol  3.125 mg Oral BID WC  . donepezil  5 mg Oral Daily  . DULoxetine  30 mg Oral Daily  . heparin injection (subcutaneous)  5,000 Units Subcutaneous Q8H  . insulin aspart  0-15 Units Subcutaneous TID WC  . insulin aspart  0-5 Units Subcutaneous QHS  . insulin aspart  6 Units Subcutaneous TID WC  . insulin glargine  60 Units Subcutaneous BID  . mirabegron ER  25 mg Oral Daily  . mupirocin ointment  1 application Nasal BID  . ramipril  5 mg Oral Daily  . simvastatin  40 mg Oral q morning - 10a   Continuous Infusions: . sodium chloride 100 mL/hr at 12/20/19 0619  . piperacillin-tazobactam (ZOSYN)  IV 3.375 g (12/20/19 0959)  . vancomycin 1,500 mg (12/19/19 2133)   PRN Meds:.acetaminophen **OR** acetaminophen, magnesium hydroxide, morphine injection, ondansetron **OR** ondansetron (ZOFRAN) IV, ondansetron (ZOFRAN) IV, traZODone  Antimicrobials: Anti-infectives (From admission, onward)   Start     Dose/Rate Route Frequency Ordered Stop   12/18/19 2200  vancomycin (VANCOREADY) IVPB 1500 mg/300 mL     1,500 mg 150 mL/hr over 120 Minutes Intravenous Every 24 hours 12/18/19 0725     12/17/19 1545  ceFAZolin (ANCEF) IVPB 2g/100 mL premix  Status:  Discontinued    Note to Pharmacy: To be given in specials   2 g 200 mL/hr over 30 Minutes Intravenous  Once 12/17/19 1531 12/17/19 1848   12/17/19 1531  ceFAZolin (ANCEF) 2-4 GM/100ML-% IVPB    Note to Pharmacy: Rozanna Box   : cabinet override      12/17/19 1531 12/18/19 0344   12/16/19 2300  vancomycin (VANCOREADY) IVPB 1250 mg/250 mL  Status:  Discontinued     1,250 mg 166.7 mL/hr over 90 Minutes Intravenous Every 24 hours 12/15/19 2302 12/18/19 0725   12/16/19 0900  piperacillin-tazobactam (ZOSYN) IVPB 3.375 g     3.375 g 12.5 mL/hr over 240 Minutes  Intravenous Every 8 hours 12/16/19 0528     12/15/19 2300  vancomycin (VANCOCIN) IVPB 1000 mg/200 mL premix  Status:  Discontinued     1,000 mg 200 mL/hr over 60 Minutes Intravenous  Once 12/15/19 2135 12/15/19 2244   12/15/19 2245  vancomycin (VANCOREADY) IVPB 2000 mg/400 mL     2,000 mg 200 mL/hr over 120 Minutes Intravenous  Once 12/15/19 2244 12/16/19 0104   12/15/19 2245  piperacillin-tazobactam (ZOSYN) IVPB 3.375 g  Status:  Discontinued     3.375 g 12.5 mL/hr over 240 Minutes Intravenous Every 8 hours 12/15/19 2244 12/16/19 0528   12/15/19 2230  piperacillin-tazobactam (ZOSYN) IVPB 3.375 g  Status:  Discontinued     3.375 g 100 mL/hr over 30 Minutes Intravenous  Once 12/15/19 2227 12/15/19 2244   12/15/19 2230  vancomycin (VANCOCIN) IVPB 1000 mg/200 mL premix  Status:  Discontinued     1,000 mg 200 mL/hr over 60 Minutes Intravenous  Once 12/15/19 2227 12/15/19 2244   12/15/19 2130  piperacillin-tazobactam (ZOSYN) IVPB 3.375 g  Status:  Discontinued     3.375 g 100 mL/hr over 30  Minutes Intravenous  Once 12/15/19 2120 12/15/19 2244   12/15/19 2130  vancomycin (VANCOCIN) IVPB 1000 mg/200 mL premix  Status:  Discontinued     1,000 mg 200 mL/hr over 60 Minutes Intravenous  Once 12/15/19 2120 12/15/19 2244       I have personally reviewed the following labs and images: CBC: Recent Labs  Lab 12/15/19 1733 12/16/19 0436 12/18/19 0505  WBC 7.4 7.4 7.1  NEUTROABS 5.2  --   --   HGB 14.2 13.4 13.8  HCT 42.7 39.2 41.3  MCV 90.5 88.3 91.0  PLT 241 225 295   BMP &GFR Recent Labs  Lab 12/15/19 1733 12/15/19 1733 12/16/19 0436 12/17/19 0513 12/18/19 0505 12/19/19 0351 12/20/19 0847  NA 132*  --  138 138 137  --   --   K 4.9  --  4.2 4.7 4.0  --   --   CL 102  --  105 106 106  --   --   CO2 23  --  26 25 25   --   --   GLUCOSE 281*  --  147* 231* 217*  --   --   BUN 27*  --  25* 20 14  --   --   CREATININE 0.99   < > 1.09* 0.99 0.92 1.07* 1.03*  CALCIUM 8.9  --  8.2*  8.3* 8.0*  --   --    < > = values in this interval not displayed.   Estimated Creatinine Clearance: 67.7 mL/min (A) (by C-G formula based on SCr of 1.03 mg/dL (H)). Liver & Pancreas: Recent Labs  Lab 12/15/19 1733  AST 22  ALT 25  ALKPHOS 81  BILITOT 0.5  PROT 7.2  ALBUMIN 3.1*   No results for input(s): LIPASE, AMYLASE in the last 168 hours. No results for input(s): AMMONIA in the last 168 hours. Diabetic: No results for input(s): HGBA1C in the last 72 hours. Recent Labs  Lab 12/19/19 1331 12/19/19 1509 12/19/19 1750 12/19/19 2059 12/20/19 0744  GLUCAP 168* 162* 201* 355* 197*   Cardiac Enzymes: No results for input(s): CKTOTAL, CKMB, CKMBINDEX, TROPONINI in the last 168 hours. No results for input(s): PROBNP in the last 8760 hours. Coagulation Profile: No results for input(s): INR, PROTIME in the last 168 hours. Thyroid Function Tests: No results for input(s): TSH, T4TOTAL, FREET4, T3FREE, THYROIDAB in the last 72 hours. Lipid Profile: No results for input(s): CHOL, HDL, LDLCALC, TRIG, CHOLHDL, LDLDIRECT in the last 72 hours. Anemia Panel: No results for input(s): VITAMINB12, FOLATE, FERRITIN, TIBC, IRON, RETICCTPCT in the last 72 hours. Urine analysis:    Component Value Date/Time   COLORURINE YELLOW (A) 05/18/2019 0010   APPEARANCEUR CLOUDY (A) 05/18/2019 0010   APPEARANCEUR Cloudy 05/20/2012 0116   LABSPEC 1.016 05/18/2019 0010   LABSPEC 1.025 05/20/2012 0116   PHURINE 6.0 05/18/2019 0010   GLUCOSEU >=500 (A) 05/18/2019 0010   GLUCOSEU >=500 05/20/2012 0116   HGBUR SMALL (A) 05/18/2019 0010   BILIRUBINUR NEGATIVE 05/18/2019 0010   BILIRUBINUR Negative 05/20/2012 0116   KETONESUR NEGATIVE 05/18/2019 0010   PROTEINUR NEGATIVE 05/18/2019 0010   NITRITE NEGATIVE 05/18/2019 0010   LEUKOCYTESUR LARGE (A) 05/18/2019 0010   LEUKOCYTESUR 2+ 05/20/2012 0116   Sepsis Labs: Invalid input(s): PROCALCITONIN, [place]  Microbiology: Recent Results (from the  past 240 hour(s))  SARS CORONAVIRUS 2 (TAT 6-24 HRS) Nasopharyngeal Nasopharyngeal Swab     Status: None   Collection Time: 12/15/19  9:57 PM   Specimen: Nasopharyngeal Swab  Result Value Ref Range Status   SARS Coronavirus 2 NEGATIVE NEGATIVE Final    Comment: (NOTE) SARS-CoV-2 target nucleic acids are NOT DETECTED. The SARS-CoV-2 RNA is generally detectable in upper and lower respiratory specimens during the acute phase of infection. Negative results do not preclude SARS-CoV-2 infection, do not rule out co-infections with other pathogens, and should not be used as the sole basis for treatment or other patient management decisions. Negative results must be combined with clinical observations, patient history, and epidemiological information. The expected result is Negative. Fact Sheet for Patients: SugarRoll.be Fact Sheet for Healthcare Providers: https://www.woods-mathews.com/ This test is not yet approved or cleared by the [place] FDA and  has been authorized for detection and/or diagnosis of SARS-CoV-2 by FDA under an Emergency Use Authorization (EUA). This EUA will remain  in effect (meaning this test can be used) for the duration of the COVID-19 declaration under Section 56 4(b)(1) of the Act, 21 U.S.C. section 360bbb-3(b)(1), unless the authorization is terminated or revoked sooner. Performed at [facility], [place] [address] [place], [place] [address]   Aerobic/Anaerobic Culture (surgical/deep wound)     Status: None   Collection Time: 12/16/19  3:35 PM   Specimen: Wound  Result Value Ref Range Status   Specimen Description   Final    WOUND Performed at [facility], [address], [place], [place] [address]    Special Requests   Final    NONE Performed at [facility], [address] [place], [place] [address]    Gram Stain   Final    NO WBC SEEN ABUNDANT GRAM POSITIVE COCCI FEW GRAM  NEGATIVE RODS    Culture   Final    MODERATE STREPTOCOCCUS ANGINOSIS FEW BACTEROIDES THETAIOTAOMICRON BETA LACTAMASE POSITIVE Performed at [facility], [place] [address], [place], [place] [address]    Report Status 12/19/2019 FINAL  Final   Organism ID, Bacteria STREPTOCOCCUS ANGINOSIS  Final      Susceptibility   Streptococcus anginosis - MIC*    PENICILLIN 0.12 SENSITIVE Sensitive     CEFTRIAXONE 0.5 SENSITIVE Sensitive     ERYTHROMYCIN >=8 RESISTANT Resistant     LEVOFLOXACIN 0.5 SENSITIVE Sensitive     VANCOMYCIN 0.5 SENSITIVE Sensitive     * MODERATE STREPTOCOCCUS ANGINOSIS  Surgical pcr screen     Status: Abnormal   Collection Time: 12/17/19  8:53 AM   Specimen: Nasal Mucosa; Nasal Swab  Result Value Ref Range Status   MRSA, PCR NEGATIVE NEGATIVE Final   Staphylococcus aureus POSITIVE (A) NEGATIVE Final    Comment: (NOTE) The Xpert SA Assay (FDA approved for NASAL specimens in patients 27 years of age and older), is one component of a comprehensive surveillance program. It is not intended to diagnose infection nor to guide or monitor treatment. Performed at [facility], [address] [place], [place] [address]   Aerobic/Anaerobic Culture (surgical/deep wound)     Status: None (Preliminary result)   Collection Time: 12/19/19  2:26 PM   Specimen: Wound  Result Value Ref Range Status   Specimen Description   Final    WOUND Performed at [facility], [address] [place], [place] [address]    Special Requests   Final    NONE Performed at [facility], [address] [place], [place] [address]    Gram Stain   Final    RARE WBC PRESENT, PREDOMINANTLY PMN NO ORGANISMS SEEN Performed at [facility], [address], [place], [place] [address]    Culture PENDING  Incomplete   Report Status PENDING  Incomplete    Radiology Studies: DG Foot 2 Views Left  Result Date: 12/19/2019 CLINICAL DATA:  Left fifth digit  amputation EXAM: LEFT FOOT - 2 VIEW COMPARISON:  12/15/2019 FINDINGS: Frontal and lateral views of the left foot demonstrate interval amputation distal aspect fifth metatarsal. Postsurgical changes are seen in the soft tissues. Remaining bony structures are unremarkable. Stable midfoot osteoarthritis and prominent calcaneal spurs. IMPRESSION: 1. Postsurgical changes amputation at the fifth metatarsal as above. Electronically Signed   By: Randa Ngo M.D.   On: 12/19/2019 18:07     Lihanna Biever T. [place]  If 7PM-7AM, please contact night-coverage [email or web address] Password [facility] 12/20/2019, 11:17 AM

## 2019-12-20 NOTE — Progress Notes (Signed)
At 0136 Pt was complaining of severe pain in LLE above ankle. Prn morphine given. Stated the wrap was too tight. Was having jerking movements in that extremity. NP Randol Kern notified. She came up to assess. She removed the top wrap.

## 2019-12-20 NOTE — Evaluation (Signed)
Occupational Therapy Evaluation Patient Details Name: Judith Shaw MRN: [id number] DOB: [birth year] Today's Date: 12/20/2019    History of Present Illness Judith Shaw is a 69 y.o. female who presents with a nonhealing ulceration to the lateral aspect of the fifth metatarsal phalangeal joint.  Patient was seen in clinic earlier in the week due to evidence of osteomyelitis on x-ray to the fifth metatarsal phalangeal joint and a wound that probes to bone at the lateral aspect of the joint.  Patient also has corresponding cellulitic changes to the left foot in which she has been treated with outpatient antibiotics with but it continues to recur. Partial 5th ray amputation 12/19/19 with NWB precautions   Clinical Impression   Ms [name] was seen for OT evaluation this date. Prior to hospital admission, pt was MOD I for ADLs and functional mobility using RW and SPC. Pt lives in 1 level home c brother available PRN. Pt presents to acute OT demonstrating impaired ADL performance and functional mobility 2/2 decreased safety awareness, functional strength/ROM/endurance deficits, and decreased LB access. Pt currently requires MIN A to stabilize RW- MAX VCs to maintain RLE NWBing pcns for SPT chair>BSC>bed. Pt repeatedly stated she would be able to walk to bathroom and each time OT reminded pt that to maintain RLE NWBing pt would have to hop to which pt responded she has a wheelchair at home that will work better than hopping. Pt required SUPERVISION toileting at [facility] c MAX A for perihygiene in standing - able to use BUE for offloading weight from RLE but required VCs to keep foot off of ground. Independent self-drinking and grooming seated in chair. Pt would benefit from skilled OT to address noted impairments and functional limitations (see below for any additional details) in order to maximize safety and independence while minimizing falls risk and caregiver burden. Upon hospital discharge, recommend HHOT to maximize pt  safety and return to functional independence during meaningful occupations of daily life.     Follow Up Recommendations  Home health OT;Supervision/Assistance - 24 hour    Equipment Recommendations  None recommended by OT    Recommendations for Other Services       Precautions / Restrictions Precautions Precautions: Fall Other Brace: post-op shoe Restrictions Weight Bearing Restrictions: Yes LLE Weight Bearing: Non weight bearing Other Position/Activity Restrictions: heel touch for transfers only      Mobility Bed Mobility Overal bed mobility: Needs Assistance Bed Mobility: Sit to Supine     Supine to sit: HOB elevated;Supervision        Transfers Overall transfer level: Needs assistance Equipment used: Rolling walker (2 wheeled) Transfers: Sit to/from [facility] Sit to Stand: Min assist         General transfer comment: Assist to stabilize RW- MAX VCs to maintain RLE NWBing pcns    Balance Overall balance assessment: Needs assistance Sitting-balance support: Feet unsupported;Bilateral upper extremity supported Sitting balance-Leahy Scale: Good     Standing balance support: Bilateral upper extremity supported Standing balance-Leahy Scale: Fair                             ADL either performed or assessed with clinical judgement   ADL Overall ADL's : Needs assistance/impaired                                       General  ADL Comments: SUP toileting at [facility] - MAX A for perihygiene. MAX A adjust socks reclined in chair. Independent self-drinking and grooming seated in chair     Vision Baseline Vision/History: Wears glasses Wears Glasses: Reading only       Perception     Praxis      Pertinent Vitals/Pain Pain Assessment: Faces Faces Pain Scale: Hurts even more Pain Location: L toes Pain Descriptors / Indicators: Grimacing;Jabbing Pain Intervention(s): Limited activity within patient's  tolerance;Repositioned     Hand Dominance Right   Extremity/Trunk Assessment Upper Extremity Assessment Upper Extremity Assessment: Overall WFL for tasks assessed   Lower Extremity Assessment Lower Extremity Assessment: Overall WFL for tasks assessed   Cervical / Trunk Assessment Cervical / Trunk Assessment: Normal   Communication Communication Communication: No difficulties   Cognition Arousal/Alertness: Awake/alert Behavior During Therapy: WFL for tasks assessed/performed Overall Cognitive Status: Within Functional Limits for tasks assessed                                     General Comments       Exercises Exercises: Other exercises Other Exercises Other Exercises: Pt educated re: falls prevention, functional application of NWBing pcn, energy conservation, BUE HEP, pain management, DME recommendations, d/c recommendations, safe t/f technique, RW technique Other Exercises: Self-drinking, LBD, toileting at [facility], sit<>stand x2, SPT chair>BSC>bed, bed mobility, sit>sup, repositioning for pain and edema management  Other Exercises: x   Shoulder Instructions      Home Living Family/patient expects to be discharged to:: Private residence Living Arrangements: Other relatives(brother) Available Help at Discharge: Family Type of Home: House Home Access: Stairs to enter Technical brewer of Steps: 1   Home Layout: One level     Bathroom Shower/Tub: Teacher, early years/pre: Standard Bathroom Accessibility: Yes How Accessible: Accessible via wheelchair;Accessible via walker Home Equipment: [place] - 2 wheels;Cane - single point;Shower seat;Grab bars - tub/shower(Pt may have tub transfer bench in storage)          Prior Functioning/Environment Level of Independence: Independent with assistive device(s)        Comments: uses SPC for all outside mobility, RW inside the home, reports no falls in past year        OT Problem List:  Decreased strength;Decreased range of motion;Decreased activity tolerance;Impaired balance (sitting and/or standing);Decreased safety awareness;Decreased knowledge of use of DME or AE;Decreased knowledge of precautions      OT Treatment/Interventions: Self-care/ADL training;Therapeutic exercise;Energy conservation;DME and/or AE instruction;Therapeutic activities;Cognitive remediation/compensation;Patient/family education;Balance training    OT Goals(Current goals can be found in the care plan section) Acute Rehab OT Goals Patient Stated Goal: go home OT Goal Formulation: With patient Time For Goal Achievement: 01/03/20 Potential to Achieve Goals: Good ADL Goals Pt Will Perform Lower Body Dressing: with min assist;with adaptive equipment;sitting/lateral leans Pt Will Transfer to Toilet: with modified independence;stand pivot transfer;bedside commode(c no cues to maintain NWBing pcn) Pt Will Perform Toileting - Clothing Manipulation and hygiene: with modified independence;sitting/lateral leans Additional ADL Goal #1: Pt will Independently verbalize x3 functional application of NWBing pcn during ADLs  OT Frequency: Min 2X/week   Barriers to D/C: Inaccessible home environment;Decreased caregiver support          Co-evaluation              AM-PAC OT "6 Clicks" Daily Activity     Outcome Measure Help from another person eating meals?: None Help from  another person taking care of personal grooming?: None Help from another person toileting, which includes using toliet, bedpan, or urinal?: A Lot Help from another person bathing (including washing, rinsing, drying)?: A Lot Help from another person to put on and taking off regular upper body clothing?: None Help from another person to put on and taking off regular lower body clothing?: A Lot 6 Click Score: 18   End of Session Equipment Utilized During Treatment: Rolling walker;Other (comment)(post-op shoe)  Activity Tolerance: Patient  tolerated treatment well Patient left: in bed;with call bell/phone within reach;with bed alarm set  OT Visit Diagnosis: Unsteadiness on feet (R26.81);Other abnormalities of gait and mobility (R26.89)                Time: [id number] OT Time Calculation (min): 47 min Charges:  OT General Charges $OT Visit: 1 Visit OT Evaluation $OT Eval Moderate Complexity: 1 Mod OT Treatments $Self Care/Home Management : 38-52 mins  Dessie Coma, M.S. OTR/L  12/20/19, 5:02 PM

## 2019-12-20 NOTE — Progress Notes (Signed)
PODIATRY / FOOT AND ANKLE SURGERY PROGRESS NOTE  Reason for consult: Left foot infection  Chief Complaint: Left foot infection   HPI: Judith Shaw is a 69 y.o. female who presents status post 1 day left partial fifth ray amputation.  Patient notes that she had pain overnight because the wrap that was placed postoperatively was fairly tight.  She states that she had relief after the external compression was removed and another Ace wrap was applied.  Patient rates her pain today at 0/10 and states that she is doing fairly well.  Patient denies any other complaints.  PMHx:  Past Medical History:  Diagnosis Date  . Anxiety   . Cancer ([place])    pt states hx of uterine cancer and had a complete hyst  . CHF (congestive heart failure) ([place])   . Depression   . Diabetes mellitus without complication ([place])   . Hyperlipidemia   . Hypertension     Surgical Hx:  Past Surgical History:  Procedure Laterality Date  . ABDOMINAL HYSTERECTOMY    . CHOLECYSTECTOMY    . LOWER EXTREMITY ANGIOGRAPHY Left 12/17/2019   Procedure: Lower Extremity Angiography;  Surgeon: Algernon Huxley, MD;  Location: [facility];  Service: Cardiovascular;  Laterality: Left;    FHx:  Family History  Problem Relation Age of Onset  . Hypertension Mother   . Heart disease Father   . Prostate cancer Brother   . Diabetes Maternal Aunt   . Kidney cancer Neg Hx   . Bladder Cancer Neg Hx     Social History:  reports that she has never smoked. She has never used smokeless tobacco. She reports that she does not drink alcohol or use drugs.  Allergies:  Allergies  Allergen Reactions  . Codeine     Review of Systems: General ROS: negative Respiratory ROS: no cough, shortness of breath, or wheezing Cardiovascular ROS: no chest pain or dyspnea on exertion Gastrointestinal ROS: no abdominal pain, change in bowel habits, or black or bloody stools Musculoskeletal ROS: positive for - joint pain and joint  swelling Neurological ROS: positive for - numbness/tingling Dermatological ROS: positive for Left partial fifth ray amputation incision site, left fourth toe lateral ulceration  Medications Prior to Admission  Medication Sig Dispense Refill  . acetaminophen (TYLENOL) 500 MG tablet Take 500 mg by mouth every 6 (six) hours as needed.    Marland Kitchen aspirin EC 81 MG tablet Take 81 mg by mouth daily.    . Baclofen 5 MG TABS Take 5 mg by mouth at bedtime. 5 tablet 0  . carvedilol (COREG) 3.125 MG tablet Take 3.125 mg by mouth 2 (two) times daily with a meal.    . clotrimazole-betamethasone (LOTRISONE) cream Apply 1 application topically 2 (two) times daily.    Marland Kitchen donepezil (ARICEPT) 5 MG tablet Take 5 mg by mouth daily.    . DULoxetine HCl 60 MG CSDR Take 60 mg by mouth daily.     . furosemide (LASIX) 20 MG tablet Take 20 mg by mouth every other day.     Marland Kitchen gentian violet 2 % topical solution Apply 0.5 mLs topically in the morning and at bedtime. Apply between the toes twice daily.    . insulin lispro (HUMALOG) 100 UNIT/ML KwikPen Inject 200 Units into the skin See admin instructions.    Marland Kitchen ketoconazole (NIZORAL) 2 % cream Apply 1 application topically in the morning and at bedtime.    . meloxicam (MOBIC) 7.5 MG tablet Take 1 tablet (  7.5 mg total) by mouth daily. 10 tablet 0  . metFORMIN (GLUCOPHAGE-XR) 500 MG 24 hr tablet Take 500 mg by mouth daily. With dinner.    . mirabegron ER (MYRBETRIQ) 25 MG TB24 tablet Take 1 tablet (25 mg total) by mouth daily. 30 tablet 0  . potassium chloride (K-DUR,KLOR-CON) 10 MEQ tablet Take 10 mEq by mouth daily.    . ramipril (ALTACE) 5 MG capsule Take 5 mg by mouth daily.    Marland Kitchen SANTYL ointment Apply 1 application topically daily.    . simvastatin (ZOCOR) 40 MG tablet Take 40 mg by mouth every morning.     Marland Kitchen terconazole (TERAZOL 7) 0.4 % vaginal cream Place 1 applicator vaginally at bedtime.    Tyler Aas FLEXTOUCH 200 UNIT/ML FlexTouch Pen Inject 120 Units into the skin in the  morning and at bedtime.    . triamcinolone lotion (KENALOG) 0.1 % Apply 1 application topically 3 (three) times daily.      Physical Exam: General: Alert and oriented.  No apparent distress.  Vascular: DP/PT pulses faintly palpable bilateral, capillary fill time intact to digits and skin flaps at the left partial fifth ray amputation site, no hair growth to feet bilateral.  Mild bilateral lower extremity edema.  Erythema present to the left foot over the amputation site area but appears to be improving since admission.  Neuro: Light touch sensation absent to bilateral lower extremities.  Derm: Left partial fifth ray amputation site appears to have sutures intact with skin edges well coapted with capillary fill time intact to the skin edge margins with mostly pinkish hue, no drainage, mild edema with erythema but improved overall since hospitalization.  Left fourth toe lateral PIPJ ulceration probes to subcutaneous tissue and close to tendon, mild maceration, mild serous drainage, wound bed appears to be fibrogranular overall, mild edema with no erythema to this area.    MSK: No pain on palpation left foot.  Left partial fifth ray amputation.  Results for orders placed or performed during the hospital encounter of 12/15/19 (from the past 48 hour(s))  Glucose, capillary     Status: Abnormal   Collection Time: 12/18/19 11:20 AM  Result Value Ref Range   Glucose-Capillary 280 (H) 70 - 99 mg/dL    Comment: Glucose reference range applies only to samples taken after fasting for at least 8 hours.  Glucose, capillary     Status: Abnormal   Collection Time: 12/18/19 12:10 PM  Result Value Ref Range   Glucose-Capillary 254 (H) 70 - 99 mg/dL    Comment: Glucose reference range applies only to samples taken after fasting for at least 8 hours.  Glucose, capillary     Status: Abnormal   Collection Time: 12/18/19  3:44 PM  Result Value Ref Range   Glucose-Capillary 318 (H) 70 - 99 mg/dL     Comment: Glucose reference range applies only to samples taken after fasting for at least 8 hours.  Glucose, capillary     Status: Abnormal   Collection Time: 12/18/19  9:45 PM  Result Value Ref Range   Glucose-Capillary 322 (H) 70 - 99 mg/dL    Comment: Glucose reference range applies only to samples taken after fasting for at least 8 hours.  Creatinine, serum     Status: Abnormal   Collection Time: 12/19/19  3:51 AM  Result Value Ref Range   Creatinine, Ser 1.07 (H) 0.44 - 1.00 mg/dL   GFR calc non Af Amer 53 (L) >60 mL/min   GFR  calc Af Amer >60 >60 mL/min    Comment: Performed at [facility], [address] [place], [place] [address]  Glucose, capillary     Status: Abnormal   Collection Time: 12/19/19  7:53 AM  Result Value Ref Range   Glucose-Capillary 211 (H) 70 - 99 mg/dL    Comment: Glucose reference range applies only to samples taken after fasting for at least 8 hours.  Glucose, capillary     Status: Abnormal   Collection Time: 12/19/19 11:41 AM  Result Value Ref Range   Glucose-Capillary 187 (H) 70 - 99 mg/dL    Comment: Glucose reference range applies only to samples taken after fasting for at least 8 hours.  Glucose, capillary     Status: Abnormal   Collection Time: 12/19/19  1:31 PM  Result Value Ref Range   Glucose-Capillary 168 (H) 70 - 99 mg/dL    Comment: Glucose reference range applies only to samples taken after fasting for at least 8 hours.  Aerobic/Anaerobic Culture (surgical/deep wound)     Status: None (Preliminary result)   Collection Time: 12/19/19  2:26 PM   Specimen: Wound  Result Value Ref Range   Specimen Description      WOUND Performed at [facility], [address], [place], [place] [address]    Special Requests      NONE Performed at [facility], [place], [place] [address]    Gram Stain      RARE WBC PRESENT, PREDOMINANTLY PMN NO ORGANISMS SEEN Performed at [facility], [place]  [address], [place], [place] [address]    Culture PENDING    Report Status PENDING   Glucose, capillary     Status: Abnormal   Collection Time: 12/19/19  3:09 PM  Result Value Ref Range   Glucose-Capillary 162 (H) 70 - 99 mg/dL    Comment: Glucose reference range applies only to samples taken after fasting for at least 8 hours.  Glucose, capillary     Status: Abnormal   Collection Time: 12/19/19  5:50 PM  Result Value Ref Range   Glucose-Capillary 201 (H) 70 - 99 mg/dL    Comment: Glucose reference range applies only to samples taken after fasting for at least 8 hours.   Comment 1 Notify RN    Comment 2 Document in Chart   Glucose, capillary     Status: Abnormal   Collection Time: 12/19/19  8:59 PM  Result Value Ref Range   Glucose-Capillary 355 (H) 70 - 99 mg/dL    Comment: Glucose reference range applies only to samples taken after fasting for at least 8 hours.  Glucose, capillary     Status: Abnormal   Collection Time: 12/20/19  7:44 AM  Result Value Ref Range   Glucose-Capillary 197 (H) 70 - 99 mg/dL    Comment: Glucose reference range applies only to samples taken after fasting for at least 8 hours.  Creatinine, serum     Status: Abnormal   Collection Time: 12/20/19  8:47 AM  Result Value Ref Range   Creatinine, Ser 1.03 (H) 0.44 - 1.00 mg/dL   GFR calc non Af Amer 55 (L) >60 mL/min   GFR calc Af Amer >60 >60 mL/min    Comment: Performed at [facility], [address] [place], [place] [address]   DG Foot 2 Views Left  Result Date: 12/19/2019 CLINICAL DATA:  Left fifth digit amputation EXAM: LEFT FOOT - 2 VIEW COMPARISON:  12/15/2019  FINDINGS: Frontal and lateral views of the left foot demonstrate interval amputation distal aspect fifth metatarsal. Postsurgical changes are seen in the soft tissues. Remaining bony structures are unremarkable. Stable midfoot osteoarthritis and prominent calcaneal spurs. IMPRESSION: 1. Postsurgical changes amputation at the fifth metatarsal as  above. Electronically Signed   By: Randa Ngo M.D.   On: 12/19/2019 18:07    Blood pressure (!) 146/60, pulse 75, temperature 97.7 F (36.5 C), temperature source Oral, resp. rate 18, height 5\' 5"  (1.651 m), weight 122.5 kg, SpO2 100 %.  Assessment 1. 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 Left foot cellulitis secondary to osteomyelitis and wound left fifth metatarsal phalangeal joint 3. Subcutaneous ulceration that goes close to tendon to the lateral aspect of the left fourth toe PIPJ 4. Diabetes type 2 uncontrolled with polyneuropathy  Plan -Patient seen and examined. -Incision site to the left partial fifth ray amputation appears to be well coapted with sutures intact with capillary fill time appeared to be intact to the skin flaps.  Sutures intact. -Erythema and edema has lessened since admission overall. -Continue to appreciate recommendations for antibiotics per medicine.  Can likely transition to 7 days oral medication for discharge based on culture results.  Previous wound culture growing strep sensitive to penicillin, few bacteroides.  Awaiting pathology result.  Anticipate that it will be clear of infection as the distal shaft of the fifth metatarsal did not appear to be diseased. -Patient's blood glucose still is up and down needs to be closely monitored and adjusted as high blood sugars will compromise her wound healing further.  Appreciate med recs. -Patient should be for the most part nonweightbearing to the left lower extremity.  Patient can use heel contact for transfers only in a surgical shoe.  PT/OT ordered. -Okay to restart heparin and aspirin as there is no active bleeding.  Since patient will be fairly nonambulatory for the next at least 3 weeks would recommend DVT prophylaxis on discharge either Lovenox or Eliquis.  Defer to medicine recs. -Patient is to leave the dressing  ambulates Dr. Luana Shu with its regional test clean, dry, and intact. -Discussed with POA [name], patient's sister, in detail. -Likely will sign off on Monday for the patient for discharge when medically cleared from medicine standpoint.  Caroline More, DPM 12/20/2019, 9:47 AM

## 2019-12-20 NOTE — Progress Notes (Signed)
PODIATRY / FOOT AND ANKLE SURGERY PROGRESS NOTE  Reason for consult: Left foot infection  Chief Complaint: Left foot infection   HPI: Judith Shaw is a 69 y.o. female who presents status post 1 day left partial fifth ray amputation.  Patient notes that she had pain overnight because the wrap that was placed postoperatively was fairly tight.  She states that she had relief after the external compression was removed and another Ace wrap was applied.  Patient rates her pain today at 0/10 and states that she is doing fairly well.  Patient denies any other complaints.  PMHx:  Past Medical History:  Diagnosis Date  . Anxiety   . Cancer ([place])    pt states hx of uterine cancer and had a complete hyst  . CHF (congestive heart failure) ([place])   . Depression   . Diabetes mellitus without complication ([place])   . Hyperlipidemia   . Hypertension     Surgical Hx:  Past Surgical History:  Procedure Laterality Date  . ABDOMINAL HYSTERECTOMY    . CHOLECYSTECTOMY    . LOWER EXTREMITY ANGIOGRAPHY Left 12/17/2019   Procedure: Lower Extremity Angiography;  Surgeon: Algernon Huxley, MD;  Location: [facility];  Service: Cardiovascular;  Laterality: Left;    FHx:  Family History  Problem Relation Age of Onset  . Hypertension Mother   . Heart disease Father   . Prostate cancer Brother   . Diabetes Maternal Aunt   . Kidney cancer Neg Hx   . Bladder Cancer Neg Hx     Social History:  reports that she has never smoked. She has never used smokeless tobacco. She reports that she does not drink alcohol or use drugs.  Allergies:  Allergies  Allergen Reactions  . Codeine     Review of Systems: General ROS: negative Respiratory ROS: no cough, shortness of breath, or wheezing Cardiovascular ROS: no chest pain or dyspnea on exertion Gastrointestinal ROS: no abdominal pain, change in bowel habits, or black or bloody stools Musculoskeletal ROS: positive for - joint pain and joint  swelling Neurological ROS: positive for - numbness/tingling Dermatological ROS: positive for Left partial fifth ray amputation incision site, left fourth toe lateral ulceration  Medications Prior to Admission  Medication Sig Dispense Refill  . acetaminophen (TYLENOL) 500 MG tablet Take 500 mg by mouth every 6 (six) hours as needed.    Marland Kitchen aspirin EC 81 MG tablet Take 81 mg by mouth daily.    . Baclofen 5 MG TABS Take 5 mg by mouth at bedtime. 5 tablet 0  . carvedilol (COREG) 3.125 MG tablet Take 3.125 mg by mouth 2 (two) times daily with a meal.    . clotrimazole-betamethasone (LOTRISONE) cream Apply 1 application topically 2 (two) times daily.    Marland Kitchen donepezil (ARICEPT) 5 MG tablet Take 5 mg by mouth daily.    . DULoxetine HCl 60 MG CSDR Take 60 mg by mouth daily.     . furosemide (LASIX) 20 MG tablet Take 20 mg by mouth every other day.     Marland Kitchen gentian violet 2 % topical solution Apply 0.5 mLs topically in the morning and at bedtime. Apply between the toes twice daily.    . insulin lispro (HUMALOG) 100 UNIT/ML KwikPen Inject 200 Units into the skin See admin instructions.    Marland Kitchen ketoconazole (NIZORAL) 2 % cream Apply 1 application topically in the morning and at bedtime.    . meloxicam (MOBIC) 7.5 MG tablet Take 1 tablet (  7.5 mg total) by mouth daily. 10 tablet 0  . metFORMIN (GLUCOPHAGE-XR) 500 MG 24 hr tablet Take 500 mg by mouth daily. With dinner.    . mirabegron ER (MYRBETRIQ) 25 MG TB24 tablet Take 1 tablet (25 mg total) by mouth daily. 30 tablet 0  . potassium chloride (K-DUR,KLOR-CON) 10 MEQ tablet Take 10 mEq by mouth daily.    . ramipril (ALTACE) 5 MG capsule Take 5 mg by mouth daily.    Marland Kitchen SANTYL ointment Apply 1 application topically daily.    . simvastatin (ZOCOR) 40 MG tablet Take 40 mg by mouth every morning.     Marland Kitchen terconazole (TERAZOL 7) 0.4 % vaginal cream Place 1 applicator vaginally at bedtime.    Tyler Aas FLEXTOUCH 200 UNIT/ML FlexTouch Pen Inject 120 Units into the skin in the  morning and at bedtime.    . triamcinolone lotion (KENALOG) 0.1 % Apply 1 application topically 3 (three) times daily.      Physical Exam: General: Alert and oriented.  No apparent distress.  Vascular: DP/PT pulses faintly palpable bilateral, capillary fill time intact to digits and skin flaps at the left partial fifth ray amputation site, no hair growth to feet bilateral.  Mild bilateral lower extremity edema.  Erythema present to the left foot over the amputation site area but appears to be improving since admission.  Neuro: Light touch sensation absent to bilateral lower extremities.  Derm: Left partial fifth ray amputation site appears to have sutures intact with skin edges well coapted with capillary fill time intact to the skin edge margins with mostly pinkish hue, no drainage, mild edema with erythema but improved overall since hospitalization.  Left fourth toe lateral PIPJ ulceration probes to subcutaneous tissue and close to tendon, mild maceration, mild serous drainage, wound bed appears to be fibrogranular overall, mild edema with no erythema to this area.    MSK: No pain on palpation left foot.  Left partial fifth ray amputation.  Results for orders placed or performed during the hospital encounter of 12/15/19 (from the past 48 hour(s))  Glucose, capillary     Status: Abnormal   Collection Time: 12/18/19 11:20 AM  Result Value Ref Range   Glucose-Capillary 280 (H) 70 - 99 mg/dL    Comment: Glucose reference range applies only to samples taken after fasting for at least 8 hours.  Glucose, capillary     Status: Abnormal   Collection Time: 12/18/19 12:10 PM  Result Value Ref Range   Glucose-Capillary 254 (H) 70 - 99 mg/dL    Comment: Glucose reference range applies only to samples taken after fasting for at least 8 hours.  Glucose, capillary     Status: Abnormal   Collection Time: 12/18/19  3:44 PM  Result Value Ref Range   Glucose-Capillary 318 (H) 70 - 99 mg/dL     Comment: Glucose reference range applies only to samples taken after fasting for at least 8 hours.  Glucose, capillary     Status: Abnormal   Collection Time: 12/18/19  9:45 PM  Result Value Ref Range   Glucose-Capillary 322 (H) 70 - 99 mg/dL    Comment: Glucose reference range applies only to samples taken after fasting for at least 8 hours.  Creatinine, serum     Status: Abnormal   Collection Time: 12/19/19  3:51 AM  Result Value Ref Range   Creatinine, Ser 1.07 (H) 0.44 - 1.00 mg/dL   GFR calc non Af Amer 53 (L) >60 mL/min   GFR  calc Af Amer >60 >60 mL/min    Comment: Performed at [facility], [address] [place], [place] [address]  Glucose, capillary     Status: Abnormal   Collection Time: 12/19/19  7:53 AM  Result Value Ref Range   Glucose-Capillary 211 (H) 70 - 99 mg/dL    Comment: Glucose reference range applies only to samples taken after fasting for at least 8 hours.  Glucose, capillary     Status: Abnormal   Collection Time: 12/19/19 11:41 AM  Result Value Ref Range   Glucose-Capillary 187 (H) 70 - 99 mg/dL    Comment: Glucose reference range applies only to samples taken after fasting for at least 8 hours.  Glucose, capillary     Status: Abnormal   Collection Time: 12/19/19  1:31 PM  Result Value Ref Range   Glucose-Capillary 168 (H) 70 - 99 mg/dL    Comment: Glucose reference range applies only to samples taken after fasting for at least 8 hours.  Aerobic/Anaerobic Culture (surgical/deep wound)     Status: None (Preliminary result)   Collection Time: 12/19/19  2:26 PM   Specimen: Wound  Result Value Ref Range   Specimen Description      WOUND Performed at [facility], [address] [place], [place] [address]    Special Requests      NONE Performed at [facility], [place], [place] [address]    Gram Stain      RARE WBC PRESENT, PREDOMINANTLY PMN NO ORGANISMS SEEN Performed at [facility], [place]  [address] [place], [place] [address]    Culture PENDING    Report Status PENDING   Glucose, capillary     Status: Abnormal   Collection Time: 12/19/19  3:09 PM  Result Value Ref Range   Glucose-Capillary 162 (H) 70 - 99 mg/dL    Comment: Glucose reference range applies only to samples taken after fasting for at least 8 hours.  Glucose, capillary     Status: Abnormal   Collection Time: 12/19/19  5:50 PM  Result Value Ref Range   Glucose-Capillary 201 (H) 70 - 99 mg/dL    Comment: Glucose reference range applies only to samples taken after fasting for at least 8 hours.   Comment 1 Notify RN    Comment 2 Document in Chart   Glucose, capillary     Status: Abnormal   Collection Time: 12/19/19  8:59 PM  Result Value Ref Range   Glucose-Capillary 355 (H) 70 - 99 mg/dL    Comment: Glucose reference range applies only to samples taken after fasting for at least 8 hours.  Glucose, capillary     Status: Abnormal   Collection Time: 12/20/19  7:44 AM  Result Value Ref Range   Glucose-Capillary 197 (H) 70 - 99 mg/dL    Comment: Glucose reference range applies only to samples taken after fasting for at least 8 hours.  Creatinine, serum     Status: Abnormal   Collection Time: 12/20/19  8:47 AM  Result Value Ref Range   Creatinine, Ser 1.03 (H) 0.44 - 1.00 mg/dL   GFR calc non Af Amer 55 (L) >60 mL/min   GFR calc Af Amer >60 >60 mL/min    Comment: Performed at [facility], [address] [place], [place] [address]   DG Foot 2 Views Left  Result Date: 12/19/2019 CLINICAL DATA:  Left fifth digit amputation EXAM: LEFT FOOT - 2 VIEW COMPARISON:  12/15/2019  FINDINGS: Frontal and lateral views of the left foot demonstrate interval amputation distal aspect fifth metatarsal. Postsurgical changes are seen in the soft tissues. Remaining bony structures are unremarkable. Stable midfoot osteoarthritis and prominent calcaneal spurs. IMPRESSION: 1. Postsurgical changes amputation at the fifth metatarsal as  above. Electronically Signed   By: Randa Ngo M.D.   On: 12/19/2019 18:07    Blood pressure (!) 146/60, pulse 75, temperature 97.7 F (36.5 C), temperature source Oral, resp. rate 18, height 5\' 5"  (1.651 m), weight 122.5 kg, SpO2 100 %.  Assessment 1. 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 Left foot cellulitis secondary to osteomyelitis and wound left fifth metatarsal phalangeal joint 3. Subcutaneous ulceration that goes close to tendon to the lateral aspect of the left fourth toe PIPJ 4. Diabetes type 2 uncontrolled with polyneuropathy  Plan -Patient seen and examined. -Incision site to the left partial fifth ray amputation appears to be well coapted with sutures intact with capillary fill time appeared to be intact to the skin flaps.  Sutures intact. -Erythema and edema has lessened since admission overall. -Redressed with Xeroform followed by 4 x 4 gauze, ABD, Kerlix, Ace wrap. -Continue to appreciate recommendations for antibiotics per medicine.  Can likely transition to 7 days oral medication for discharge based on culture results.  Previous wound culture growing strep sensitive to penicillin, few bacteroides.  Awaiting pathology result.  Anticipate that it will be clear of infection as the distal shaft of the fifth metatarsal did not appear to be diseased. -Patient's blood glucose still is up and down needs to be closely monitored and adjusted as high blood sugars will compromise her wound healing further.  Appreciate med recs. -Patient should be for the most part nonweightbearing to the left lower extremity.  Patient can use heel contact for transfers only in a surgical shoe.  PT/OT ordered. -Okay to restart heparin and aspirin as there is no active bleeding.  Since patient will be fairly nonambulatory for the next at least 3 weeks would recommend DVT prophylaxis on discharge either Lovenox or  Eliquis.  Defer to medicine recs. -Patient is to leave the dressing ambulates Dr. Luana Shu with its regional test clean, dry, and intact. -Discussed with POA [name], patient's sister, in detail. -Likely will sign off on Monday for the patient for discharge when medically cleared from medicine standpoint.  Caroline More, DPM 12/20/2019, 10:17 AM

## 2019-12-21 DIAGNOSIS — I5032 Chronic diastolic (congestive) heart failure: Secondary | ICD-10-CM | POA: Diagnosis not present

## 2019-12-21 DIAGNOSIS — I10 Essential (primary) hypertension: Secondary | ICD-10-CM | POA: Diagnosis not present

## 2019-12-21 DIAGNOSIS — M86172 Other acute osteomyelitis, left ankle and foot: Secondary | ICD-10-CM | POA: Diagnosis not present

## 2019-12-21 DIAGNOSIS — F0390 Unspecified dementia without behavioral disturbance: Secondary | ICD-10-CM | POA: Diagnosis not present

## 2019-12-21 LAB — GLUCOSE, CAPILLARY
Glucose-Capillary: 168 mg/dL — ABNORMAL HIGH (ref 70–99)
Glucose-Capillary: 265 mg/dL — ABNORMAL HIGH (ref 70–99)
Glucose-Capillary: 110 mg/dL — ABNORMAL HIGH (ref 70–99)
Glucose-Capillary: 135 mg/dL — ABNORMAL HIGH (ref 70–99)

## 2019-12-21 LAB — CBC
HCT: 39 % (ref 36.0–46.0)
Hemoglobin: 12.8 g/dL (ref 12.0–15.0)
MCH: 30.3 pg (ref 26.0–34.0)
MCHC: 32.8 g/dL (ref 30.0–36.0)
MCV: 92.2 fL (ref 80.0–100.0)
Platelets: 266 10*3/uL (ref 150–400)
RBC: 4.23 MIL/uL (ref 3.87–5.11)
RDW: 13.1 % (ref 11.5–15.5)
WBC: 9.2 10*3/uL (ref 4.0–10.5)
nRBC: 0 % (ref 0.0–0.2)

## 2019-12-21 LAB — BASIC METABOLIC PANEL
Anion gap: 4 — ABNORMAL LOW (ref 5–15)
BUN: 14 mg/dL (ref 8–23)
CO2: 24 mmol/L (ref 22–32)
Calcium: 8.2 mg/dL — ABNORMAL LOW (ref 8.9–10.3)
Chloride: 107 mmol/L (ref 98–111)
Creatinine, Ser: 1.16 mg/dL — ABNORMAL HIGH (ref 0.44–1.00)
GFR calc Af Amer: 56 mL/min — ABNORMAL LOW (ref >60)
GFR calc non Af Amer: 48 mL/min — ABNORMAL LOW (ref >60)
Glucose, Bld: 216 mg/dL — ABNORMAL HIGH (ref 70–99)
Potassium: 4.9 mmol/L (ref 3.5–5.1)
Sodium: 135 mmol/L (ref 135–145)

## 2019-12-21 LAB — CREATININE, SERUM
Creatinine, Ser: 1.12 mg/dL — ABNORMAL HIGH (ref 0.44–1.00)
GFR calc Af Amer: 58 mL/min — ABNORMAL LOW (ref >60)
GFR calc non Af Amer: 50 mL/min — ABNORMAL LOW (ref >60)

## 2019-12-21 LAB — MAGNESIUM: Magnesium: 1.9 mg/dL (ref 1.7–2.4)

## 2019-12-21 MED ORDER — INSULIN ASPART 100 UNIT/ML ~~LOC~~ SOLN
0.0000 [IU] | Freq: Three times a day (TID) | SUBCUTANEOUS | Status: DC
  Administered 2019-12-21: 11 [IU] via SUBCUTANEOUS
  Administered 2019-12-21: 4 [IU] via SUBCUTANEOUS
  Filled 2019-12-21 (×2): qty 1

## 2019-12-21 MED ORDER — INSULIN ASPART 100 UNIT/ML ~~LOC~~ SOLN
8.0000 [IU] | Freq: Three times a day (TID) | SUBCUTANEOUS | Status: DC
  Administered 2019-12-21 (×2): 8 [IU] via SUBCUTANEOUS
  Filled 2019-12-21 (×2): qty 1

## 2019-12-21 MED ORDER — INSULIN ASPART 100 UNIT/ML ~~LOC~~ SOLN: 0.0000 [IU] | Freq: Every day | SUBCUTANEOUS | Status: DC

## 2019-12-21 NOTE — Plan of Care (Signed)
  Problem: Education: Goal: Knowledge of General Education information will improve Description: Including pain rating scale, medication(s)/side effects and non-pharmacologic comfort measures Outcome: Progressing   Problem: Clinical Measurements: Goal: Ability to maintain clinical measurements within normal limits will improve Outcome: Progressing Goal: Will remain free from infection Outcome: Progressing Goal: Diagnostic test results will improve Outcome: Progressing Goal: Respiratory complications will improve Outcome: Progressing Goal: Cardiovascular complication will be avoided Outcome: Progressing   Problem: Coping: Goal: Level of anxiety will decrease Outcome: Progressing   Problem: Pain Managment: Goal: General experience of comfort will improve Outcome: Progressing   Problem: Safety: Goal: Ability to remain free from injury will improve Outcome: Progressing   

## 2019-12-21 NOTE — Progress Notes (Signed)
PROGRESS NOTE  Judith Shaw [id number] DOB: [birth year]   PCP: Kirk Ruths, MD  Patient is from: Home.  Independently ambulates at baseline.  DOA: 12/15/2019 LOS: 6  Brief Narrative / Interim history: 69 year old female with history of HTN, uncontrolled DM-2, diastolic CHF, OSA and depression presented with left foot wound and diabetic ulcer and found to have osteomyelitis.  Started on IV antibiotics (vancomycin and Zosyn).  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  Tissue cultures on 4/20 with Streptococcus anginosus and few bacteriodes.  Surgical tissue culture on 4/23 pending.  Antibiotics descalated to IV Unasyn.   Subjective: Seen and examined earlier this morning.  No major events overnight of this morning.  Feels a sleepy.  She has history of sleep apnea but does not use CPAP.  Pain fairly controlled.  No other complaints.  Denies chest pain, dyspnea, GI or UTI symptoms.  Objective: Vitals:   12/20/19 0749 12/20/19 1536 12/20/19 2342 12/21/19 0811  BP: (!) 146/60 (!) 153/61 (!) 118/49 (!) 121/54  Pulse: 75 82 67 79  Resp: 18 18 18 16   Temp: 97.7 F (36.5 C) 97.8 F (36.6 C) 98.6 F (37 C) 98.3 F (36.8 C)  TempSrc: Oral Oral Oral   SpO2: 100% 94% 92% 90%  Weight:      Height:        Intake/Output Summary (Last 24 hours) at 12/21/2019 1003 Last data filed at 12/21/2019 0500 Gross per 24 hour  Intake 2229.25 ml  Output 450 ml  Net 1779.25 ml   Filed Weights   12/15/19 1724 12/19/19 1324  Weight: 122.5 kg 122.5 kg    Examination:  GENERAL: No apparent distress.  Nontoxic. HEENT: MMM.  Vision and hearing grossly intact.  NECK: Supple.  No apparent JVD.  RESP: On room air.  No IWOB.  Fair aeration bilaterally. CVS:  RRR. Heart sounds normal.  ABD/GI/GU: Bowel sounds present. Soft. Non tender.  MSK/EXT:  Moves extremities. No apparent deformity. No edema.  SKIN: Dressing over LLE DCI. NEURO: Sleepy but wakes to voice  easily.  Oriented x4 except to date.  No apparent focal neuro deficit. PSYCH: Calm. Normal affect.  Procedures:  4/21-angiography 4/23-left fifth ray amputation  Microbiology summarized: 4/19-COVID-19 PCR negative. 4/21-MRSA PCR negative 4/20-tissue culture with Streptococcus anginosus and few bacteriodes 4/23-tissue culture pending.  Assessment & Plan: Diabetic left wound infection with osteomyelitis -4/21-status post arteriogram -4/23-left fifth partial ray amputation -Tissue culture on 4/20 with Streptococcus anginosus and few bacteriodes.  Tissue culture on 4/23 NGTD. - IV Zosyn and vancomycin>> IV Unasyn on 4/24.  Will involve ID for antibiotics after culture speciation -Optimize blood glucose control. -Nonweightbearing except for heel contact for transfers only in surgical shoe -Scheduled Tylenol with as needed oxygen morphine for pain control. -PT/OT-recommended HH PT/OT with DME.   Uncontrolled DM-2 with hyperglycemia: A1c 11.3%.  Tresiba Metformin at home. Recent Labs    12/20/19 1634 12/20/19 2057 12/21/19 0807  GLUCAP 286* 254* 168*  -Increase SSI to high with night coverage. -Increase NovoLog from 6 to 8 units AC. -Continue Lantus 60 units twice daily. -Continue statin. -Could benefit from GLP-1 inhibitors given obesity  Essential hypertension: Normotensive. -Continue home carvedilol and ramipril  Chronic diastolic congestive heart failure: no echo in the system.  Appears euvolemic.  No cardiopulmonary symptoms. -Continue home medications -Monitor fluid status -Sodium and fluid restrictions.  Dementia without behavioral disturbance: Recent diagnosis.   -Continue home Aricept  -Frequent reorientation and delirium  precautions.  History of depression: Stable. -Continue on Cymbalta.  Hyperlipidemia - Continue statin  Obstructive sleep apnea-does not use CPAP at home. -Nightly CPAP  Morbid obesity: Body mass index is 44.94 kg/m. -Encourage  lifestyle change to lose weight. -Could benefit from GLP-1 inhibitors in the setting of diabetes.                   DVT prophylaxis: Subcu heparin Code Status: Full code Family Communication: Patient and/or RN. Available if any question.   Discharge barrier: Diabetic left foot infection with osteomyelitis on IV antibiotics. Patient is from: Home Final disposition: Likely home with home health and DME in the next 24 to 48 hours pending tissue culture hydration and sensitivity from 4/23 and clearance by podiatry  Consultants:  Podiatry   Sch Meds:  Scheduled Meds: . acetaminophen  1,000 mg Oral Q8H  . aspirin EC  81 mg Oral Daily  . baclofen  5 mg Oral QHS  . carvedilol  3.125 mg Oral BID WC  . donepezil  5 mg Oral Daily  . DULoxetine  30 mg Oral Daily  . heparin injection (subcutaneous)  5,000 Units Subcutaneous Q8H  . insulin aspart  0-20 Units Subcutaneous TID WC  . insulin aspart  0-5 Units Subcutaneous QHS  . insulin aspart  8 Units Subcutaneous TID WC  . insulin glargine  60 Units Subcutaneous BID  . mirabegron ER  25 mg Oral Daily  . mupirocin ointment  1 application Nasal BID  . ramipril  5 mg Oral Daily  . simvastatin  40 mg Oral q morning - 10a   Continuous Infusions: . sodium chloride 100 mL/hr at 12/21/19 0555  . ampicillin-sulbactam (UNASYN) IV 3 g (12/21/19 0557)   PRN Meds:.magnesium hydroxide, morphine injection, ondansetron **OR** ondansetron (ZOFRAN) IV, ondansetron (ZOFRAN) IV, oxyCODONE, traZODone  Antimicrobials: Anti-infectives (From admission, onward)   Start     Dose/Rate Route Frequency Ordered Stop   12/20/19 1800  Ampicillin-Sulbactam (UNASYN) 3 g in sodium chloride 0.9 % 100 mL IVPB     3 g 200 mL/hr over 30 Minutes Intravenous Every 6 hours 12/20/19 1537     12/18/19 2200  vancomycin (VANCOREADY) IVPB 1500 mg/300 mL  Status:  Discontinued     1,500 mg 150 mL/hr over 120 Minutes Intravenous Every 24 hours 12/18/19 0725 12/20/19  1537   12/17/19 1545  ceFAZolin (ANCEF) IVPB 2g/100 mL premix  Status:  Discontinued    Note to Pharmacy: To be given in specials   2 g 200 mL/hr over 30 Minutes Intravenous  Once 12/17/19 1531 12/17/19 1848   12/17/19 1531  ceFAZolin (ANCEF) 2-4 GM/100ML-% IVPB    Note to Pharmacy: Rozanna Box   : cabinet override      12/17/19 1531 12/18/19 0344   12/16/19 2300  vancomycin (VANCOREADY) IVPB 1250 mg/250 mL  Status:  Discontinued     1,250 mg 166.7 mL/hr over 90 Minutes Intravenous Every 24 hours 12/15/19 2302 12/18/19 0725   12/16/19 0900  piperacillin-tazobactam (ZOSYN) IVPB 3.375 g  Status:  Discontinued     3.375 g 12.5 mL/hr over 240 Minutes Intravenous Every 8 hours 12/16/19 0528 12/20/19 1537   12/15/19 2300  vancomycin (VANCOCIN) IVPB 1000 mg/200 mL premix  Status:  Discontinued     1,000 mg 200 mL/hr over 60 Minutes Intravenous  Once 12/15/19 2135 12/15/19 2244   12/15/19 2245  vancomycin (VANCOREADY) IVPB 2000 mg/400 mL     2,000 mg 200 mL/hr over 120 Minutes  Intravenous  Once 12/15/19 2244 12/16/19 0104   12/15/19 2245  piperacillin-tazobactam (ZOSYN) IVPB 3.375 g  Status:  Discontinued     3.375 g 12.5 mL/hr over 240 Minutes Intravenous Every 8 hours 12/15/19 2244 12/16/19 0528   12/15/19 2230  piperacillin-tazobactam (ZOSYN) IVPB 3.375 g  Status:  Discontinued     3.375 g 100 mL/hr over 30 Minutes Intravenous  Once 12/15/19 2227 12/15/19 2244   12/15/19 2230  vancomycin (VANCOCIN) IVPB 1000 mg/200 mL premix  Status:  Discontinued     1,000 mg 200 mL/hr over 60 Minutes Intravenous  Once 12/15/19 2227 12/15/19 2244   12/15/19 2130  piperacillin-tazobactam (ZOSYN) IVPB 3.375 g  Status:  Discontinued     3.375 g 100 mL/hr over 30 Minutes Intravenous  Once 12/15/19 2120 12/15/19 2244   12/15/19 2130  vancomycin (VANCOCIN) IVPB 1000 mg/200 mL premix  Status:  Discontinued     1,000 mg 200 mL/hr over 60 Minutes Intravenous  Once 12/15/19 2120 12/15/19 2244       I have  personally reviewed the following labs and images: CBC: Recent Labs  Lab 12/15/19 1733 12/16/19 0436 12/18/19 0505 12/21/19 0003  WBC 7.4 7.4 7.1 9.2  NEUTROABS 5.2  --   --   --   HGB 14.2 13.4 13.8 12.8  HCT 42.7 39.2 41.3 39.0  MCV 90.5 88.3 91.0 92.2  PLT 241 225 295 266   BMP &GFR Recent Labs  Lab 12/15/19 1733 12/15/19 1733 12/16/19 0436 12/16/19 0436 12/17/19 0513 12/17/19 0513 12/18/19 0505 12/19/19 0351 12/20/19 0847 12/21/19 0003 12/21/19 0425  NA 132*  --  138  --  138  --  137  --   --  135  --   K 4.9  --  4.2  --  4.7  --  4.0  --   --  4.9  --   CL 102  --  105  --  106  --  106  --   --  107  --   CO2 23  --  26  --  25  --  25  --   --  24  --   GLUCOSE 281*  --  147*  --  231*  --  217*  --   --  216*  --   BUN 27*  --  25*  --  20  --  14  --   --  14  --   CREATININE 0.99   < > 1.09*   < > 0.99   < > 0.92 1.07* 1.03* 1.16* 1.12*  CALCIUM 8.9  --  8.2*  --  8.3*  --  8.0*  --   --  8.2*  --   MG  --   --   --   --   --   --   --   --   --  1.9  --    < > = values in this interval not displayed.   Estimated Creatinine Clearance: 62.3 mL/min (A) (by C-G formula based on SCr of 1.12 mg/dL (H)). Liver & Pancreas: Recent Labs  Lab 12/15/19 1733  AST 22  ALT 25  ALKPHOS 81  BILITOT 0.5  PROT 7.2  ALBUMIN 3.1*   No results for input(s): LIPASE, AMYLASE in the last 168 hours. No results for input(s): AMMONIA in the last 168 hours. Diabetic: No results for input(s): HGBA1C in the last 72 hours. Recent Labs  Lab 12/20/19 0744 12/20/19  1203 12/20/19 1634 12/20/19 2057 12/21/19 0807  GLUCAP 197* 227* 286* 254* 168*   Cardiac Enzymes: No results for input(s): CKTOTAL, CKMB, CKMBINDEX, TROPONINI in the last 168 hours. No results for input(s): PROBNP in the last 8760 hours. Coagulation Profile: No results for input(s): INR, PROTIME in the last 168 hours. Thyroid Function Tests: No results for input(s): TSH, T4TOTAL, FREET4, T3FREE, THYROIDAB  in the last 72 hours. Lipid Profile: No results for input(s): CHOL, HDL, LDLCALC, TRIG, CHOLHDL, LDLDIRECT in the last 72 hours. Anemia Panel: No results for input(s): VITAMINB12, FOLATE, FERRITIN, TIBC, IRON, RETICCTPCT in the last 72 hours. Urine analysis:    Component Value Date/Time   COLORURINE YELLOW (A) 05/18/2019 0010   APPEARANCEUR CLOUDY (A) 05/18/2019 0010   APPEARANCEUR Cloudy 05/20/2012 0116   LABSPEC 1.016 05/18/2019 0010   LABSPEC 1.025 05/20/2012 0116   PHURINE 6.0 05/18/2019 0010   GLUCOSEU >=500 (A) 05/18/2019 0010   GLUCOSEU >=500 05/20/2012 0116   HGBUR SMALL (A) 05/18/2019 0010   BILIRUBINUR NEGATIVE 05/18/2019 0010   BILIRUBINUR Negative 05/20/2012 0116   KETONESUR NEGATIVE 05/18/2019 0010   PROTEINUR NEGATIVE 05/18/2019 0010   NITRITE NEGATIVE 05/18/2019 0010   LEUKOCYTESUR LARGE (A) 05/18/2019 0010   LEUKOCYTESUR 2+ 05/20/2012 0116   Sepsis Labs: Invalid input(s): PROCALCITONIN, [place]  Microbiology: Recent Results (from the past 240 hour(s))  SARS CORONAVIRUS 2 (TAT 6-24 HRS) Nasopharyngeal Nasopharyngeal Swab     Status: None   Collection Time: 12/15/19  9:57 PM   Specimen: Nasopharyngeal Swab  Result Value Ref Range Status   SARS Coronavirus 2 NEGATIVE NEGATIVE Final    Comment: (NOTE) SARS-CoV-2 target nucleic acids are NOT DETECTED. The SARS-CoV-2 RNA is generally detectable in upper and lower respiratory specimens during the acute phase of infection. Negative results do not preclude SARS-CoV-2 infection, do not rule out co-infections with other pathogens, and should not be used as the sole basis for treatment or other patient management decisions. Negative results must be combined with clinical observations, patient history, and epidemiological information. The expected result is Negative. Fact Sheet for Patients: SugarRoll.be Fact Sheet for Healthcare  Providers: https://www.woods-mathews.com/ This test is not yet approved or cleared by the [place] FDA and  has been authorized for detection and/or diagnosis of SARS-CoV-2 by FDA under an Emergency Use Authorization (EUA). This EUA will remain  in effect (meaning this test can be used) for the duration of the COVID-19 declaration under Section 56 4(b)(1) of the Act, 21 U.S.C. section 360bbb-3(b)(1), unless the authorization is terminated or revoked sooner. Performed at [facility], [place] [address], [place], [place] [address]   Aerobic/Anaerobic Culture (surgical/deep wound)     Status: None   Collection Time: 12/16/19  3:35 PM   Specimen: Wound  Result Value Ref Range Status   Specimen Description   Final    WOUND Performed at [facility], [address], [place], [place] [address]    Special Requests   Final    NONE Performed at [facility], [address] [place], [place] [address]    Gram Stain   Final    NO WBC SEEN ABUNDANT GRAM POSITIVE COCCI FEW GRAM NEGATIVE RODS    Culture   Final    MODERATE STREPTOCOCCUS ANGINOSIS FEW BACTEROIDES THETAIOTAOMICRON BETA LACTAMASE POSITIVE Performed at [facility], [place] [address], [place], [place] [address]    Report Status 12/19/2019 FINAL  Final   Organism ID, Bacteria STREPTOCOCCUS ANGINOSIS  Final  Susceptibility   Streptococcus anginosis - MIC*    PENICILLIN 0.12 SENSITIVE Sensitive     CEFTRIAXONE 0.5 SENSITIVE Sensitive     ERYTHROMYCIN >=8 RESISTANT Resistant     LEVOFLOXACIN 0.5 SENSITIVE Sensitive     VANCOMYCIN 0.5 SENSITIVE Sensitive     * MODERATE STREPTOCOCCUS ANGINOSIS  Surgical pcr screen     Status: Abnormal   Collection Time: 12/17/19  8:53 AM   Specimen: Nasal Mucosa; Nasal Swab  Result Value Ref Range Status   MRSA, PCR NEGATIVE NEGATIVE Final   Staphylococcus aureus POSITIVE (A) NEGATIVE Final    Comment: (NOTE) The Xpert SA Assay (FDA approved  for NASAL specimens in patients 61 years of age and older), is one component of a comprehensive surveillance program. It is not intended to diagnose infection nor to guide or monitor treatment. Performed at [facility], [address] [place], [place] [address]   Aerobic/Anaerobic Culture (surgical/deep wound)     Status: None (Preliminary result)   Collection Time: 12/19/19  2:26 PM   Specimen: Wound  Result Value Ref Range Status   Specimen Description   Final    WOUND Performed at [facility], [address], [place], [place] [address]    Special Requests   Final    NONE Performed at [facility], [address] [place], [place] [address]    Gram Stain   Final    RARE WBC PRESENT, PREDOMINANTLY PMN NO ORGANISMS SEEN Performed at [facility], [place] [address], [place], [place] [address]    Culture PENDING  Incomplete   Report Status PENDING  Incomplete    Radiology Studies: No results found.   Santia Labate T. [place]  If 7PM-7AM, please contact night-coverage [email or web address] Password [facility] 12/21/2019, 10:03 AM

## 2019-12-21 NOTE — Progress Notes (Signed)
Pt refused cpap

## 2019-12-22 DIAGNOSIS — I10 Essential (primary) hypertension: Secondary | ICD-10-CM | POA: Diagnosis not present

## 2019-12-22 DIAGNOSIS — M86172 Other acute osteomyelitis, left ankle and foot: Secondary | ICD-10-CM | POA: Diagnosis not present

## 2019-12-22 DIAGNOSIS — F0390 Unspecified dementia without behavioral disturbance: Secondary | ICD-10-CM | POA: Diagnosis not present

## 2019-12-22 DIAGNOSIS — I5032 Chronic diastolic (congestive) heart failure: Secondary | ICD-10-CM | POA: Diagnosis not present

## 2019-12-22 LAB — RENAL FUNCTION PANEL
Albumin: 2.7 g/dL — ABNORMAL LOW (ref 3.5–5.0)
Anion gap: 4 — ABNORMAL LOW (ref 5–15)
BUN: 11 mg/dL (ref 8–23)
CO2: 23 mmol/L (ref 22–32)
Calcium: 8.2 mg/dL — ABNORMAL LOW (ref 8.9–10.3)
Chloride: 113 mmol/L — ABNORMAL HIGH (ref 98–111)
Creatinine, Ser: 0.85 mg/dL (ref 0.44–1.00)
GFR calc Af Amer: >60 mL/min (ref >60)
GFR calc non Af Amer: >60 mL/min (ref >60)
Glucose, Bld: 68 mg/dL — ABNORMAL LOW (ref 70–99)
Phosphorus: 2.1 mg/dL — ABNORMAL LOW (ref 2.5–4.6)
Potassium: 3.8 mmol/L (ref 3.5–5.1)
Sodium: 140 mmol/L (ref 135–145)

## 2019-12-22 LAB — GLUCOSE, CAPILLARY
Glucose-Capillary: 57 mg/dL — ABNORMAL LOW (ref 70–99)
Glucose-Capillary: 71 mg/dL (ref 70–99)
Glucose-Capillary: 213 mg/dL — ABNORMAL HIGH (ref 70–99)
Glucose-Capillary: 124 mg/dL — ABNORMAL HIGH (ref 70–99)
Glucose-Capillary: 223 mg/dL — ABNORMAL HIGH (ref 70–99)

## 2019-12-22 LAB — MAGNESIUM: Magnesium: 1.9 mg/dL (ref 1.7–2.4)

## 2019-12-22 LAB — SARS CORONAVIRUS 2 (TAT 6-24 HRS): SARS Coronavirus 2: NEGATIVE

## 2019-12-22 MED ORDER — INSULIN ASPART 100 UNIT/ML ~~LOC~~ SOLN
6.0000 [IU] | Freq: Three times a day (TID) | SUBCUTANEOUS | Status: DC
  Administered 2019-12-22: 6 [IU] via SUBCUTANEOUS
  Filled 2019-12-22: qty 1

## 2019-12-22 MED ORDER — ACETAMINOPHEN 500 MG PO TABS: 1000.0000 mg | ORAL_TABLET | Freq: Three times a day (TID) | ORAL | 0 refills | Status: DC

## 2019-12-22 MED ORDER — INSULIN ASPART 100 UNIT/ML ~~LOC~~ SOLN: 6.0000 [IU] | Freq: Three times a day (TID) | SUBCUTANEOUS | 11 refills | Status: DC

## 2019-12-22 MED ORDER — INSULIN ASPART 100 UNIT/ML ~~LOC~~ SOLN
0.0000 [IU] | Freq: Every day | SUBCUTANEOUS | Status: DC
  Administered 2019-12-22: 2 [IU] via SUBCUTANEOUS
  Filled 2019-12-22: qty 1

## 2019-12-22 MED ORDER — AMOXICILLIN-POT CLAVULANATE 875-125 MG PO TABS: 1.0000 | ORAL_TABLET | Freq: Two times a day (BID) | ORAL | 0 refills | Status: DC

## 2019-12-22 MED ORDER — INSULIN GLARGINE 100 UNIT/ML ~~LOC~~ SOLN: 60.0000 [IU] | Freq: Two times a day (BID) | SUBCUTANEOUS | 11 refills | Status: DC

## 2019-12-22 MED ORDER — ENOXAPARIN SODIUM 40 MG/0.4ML ~~LOC~~ SOLN
40.0000 mg | SUBCUTANEOUS | Status: DC
  Administered 2019-12-22 – 2019-12-23 (×2): 40 mg via SUBCUTANEOUS
  Filled 2019-12-22 (×2): qty 0.4

## 2019-12-22 MED ORDER — INSULIN ASPART 100 UNIT/ML ~~LOC~~ SOLN
0.0000 [IU] | Freq: Three times a day (TID) | SUBCUTANEOUS | Status: DC
  Administered 2019-12-22: 2 [IU] via SUBCUTANEOUS
  Administered 2019-12-22: 5 [IU] via SUBCUTANEOUS
  Administered 2019-12-23: 17:00:00 3 [IU] via SUBCUTANEOUS
  Administered 2019-12-23: 2 [IU] via SUBCUTANEOUS
  Administered 2019-12-23: 3 [IU] via SUBCUTANEOUS
  Administered 2019-12-24: 12:00:00 2 [IU] via SUBCUTANEOUS
  Filled 2019-12-22 (×6): qty 1

## 2019-12-22 MED ORDER — SACCHAROMYCES BOULARDII 250 MG PO CAPS
250.0000 mg | ORAL_CAPSULE | Freq: Two times a day (BID) | ORAL | Status: DC
  Administered 2019-12-22 – 2019-12-24 (×4): 250 mg via ORAL
  Filled 2019-12-22 (×6): qty 1

## 2019-12-22 MED ORDER — SACCHAROMYCES BOULARDII 250 MG PO CAPS: 250.0000 mg | ORAL_CAPSULE | Freq: Two times a day (BID) | ORAL | 0 refills | Status: DC

## 2019-12-22 MED ORDER — TRAZODONE HCL 50 MG PO TABS: 25.0000 mg | ORAL_TABLET | Freq: Every evening | ORAL | 1 refills | Status: DC | PRN

## 2019-12-22 MED ORDER — INSULIN ASPART 100 UNIT/ML ~~LOC~~ SOLN: 0.0000 [IU] | Freq: Three times a day (TID) | SUBCUTANEOUS | 11 refills | Status: DC

## 2019-12-22 MED ORDER — AMOXICILLIN-POT CLAVULANATE 875-125 MG PO TABS
1.0000 | ORAL_TABLET | Freq: Two times a day (BID) | ORAL | Status: DC
  Administered 2019-12-22 – 2019-12-24 (×4): 1 via ORAL
  Filled 2019-12-22 (×4): qty 1

## 2019-12-22 NOTE — TOC Progression Note (Signed)
Transition of Care [facility]) - Progression Note    Patient Details  Name: Judith Shaw MRN: [id number] Date of Birth: [birth year]  Transition of Care [facility]) CM/SW [place], RN Phone Number: 12/22/2019, 9:28 AM  Clinical Narrative:     I met with the patient and reviewed bed offers, she would like to go to [facility], notified Claiborne Billings at [facility], I called [place] to start insurance approval, ref number [phone], faxed clinical to [phone]        Expected Discharge Plan and Services                                                 Social Determinants of Health (SDOH) Interventions    Readmission Risk Interventions No flowsheet data found.

## 2019-12-22 NOTE — Progress Notes (Signed)
PODIATRY / FOOT AND ANKLE SURGERY PROGRESS NOTE  Chief Complaint: Left foot infection s/p surgery   HPI: Judith Shaw is a 69 y.o. female who presents status post 3 days left partial fifth ray amputation.  Patient denies any complaints today.  Patient rates her pain today at 0/10 and states that she is doing fairly well.  Patient denies any other complaints.  Patient has been working with PT and OT and has done fairly well and stay off the left foot is much as possible for transfers.  Her dressing today is clean, dry, and intact.  PMHx:  Past Medical History:  Diagnosis Date  . Anxiety   . Cancer ([place])    pt states hx of uterine cancer and had a complete hyst  . CHF (congestive heart failure) ([place])   . Depression   . Diabetes mellitus without complication ([place])   . Hyperlipidemia   . Hypertension     Surgical Hx:  Past Surgical History:  Procedure Laterality Date  . ABDOMINAL HYSTERECTOMY    . AMPUTATION Left 12/19/2019   Procedure: AMPUTATION RAY Left 5th;  Surgeon: Caroline More, DPM;  Location: [facility];  Service: Podiatry;  Laterality: Left;  . CHOLECYSTECTOMY    . LOWER EXTREMITY ANGIOGRAPHY Left 12/17/2019   Procedure: Lower Extremity Angiography;  Surgeon: Algernon Huxley, MD;  Location: [facility];  Service: Cardiovascular;  Laterality: Left;    FHx:  Family History  Problem Relation Age of Onset  . Hypertension Mother   . Heart disease Father   . Prostate cancer Brother   . Diabetes Maternal Aunt   . Kidney cancer Neg Hx   . Bladder Cancer Neg Hx     Social History:  reports that she has never smoked. She has never used smokeless tobacco. She reports that she does not drink alcohol or use drugs.  Allergies:  Allergies  Allergen Reactions  . Codeine     Review of Systems: General ROS: negative Respiratory ROS: no cough, shortness of breath, or wheezing Cardiovascular ROS: no chest pain or dyspnea on exertion Gastrointestinal ROS: no abdominal pain,  change in bowel habits, or black or bloody stools Musculoskeletal ROS: positive for - joint pain and joint swelling Neurological ROS: positive for - numbness/tingling Dermatological ROS: positive for Left partial fifth ray amputation incision site, left fourth toe lateral ulceration  Medications Prior to Admission  Medication Sig Dispense Refill  . acetaminophen (TYLENOL) 500 MG tablet Take 500 mg by mouth every 6 (six) hours as needed.    Marland Kitchen aspirin EC 81 MG tablet Take 81 mg by mouth daily.    . Baclofen 5 MG TABS Take 5 mg by mouth at bedtime. 5 tablet 0  . carvedilol (COREG) 3.125 MG tablet Take 3.125 mg by mouth 2 (two) times daily with a meal.    . clotrimazole-betamethasone (LOTRISONE) cream Apply 1 application topically 2 (two) times daily.    Marland Kitchen donepezil (ARICEPT) 5 MG tablet Take 5 mg by mouth daily.    . DULoxetine HCl 60 MG CSDR Take 60 mg by mouth daily.     . furosemide (LASIX) 20 MG tablet Take 20 mg by mouth every other day.     Marland Kitchen gentian violet 2 % topical solution Apply 0.5 mLs topically in the morning and at bedtime. Apply between the toes twice daily.    . insulin lispro (HUMALOG) 100 UNIT/ML KwikPen Inject 200 Units into the skin See admin instructions.    Marland Kitchen ketoconazole (  NIZORAL) 2 % cream Apply 1 application topically in the morning and at bedtime.    . meloxicam (MOBIC) 7.5 MG tablet Take 1 tablet (7.5 mg total) by mouth daily. 10 tablet 0  . metFORMIN (GLUCOPHAGE-XR) 500 MG 24 hr tablet Take 500 mg by mouth daily. With dinner.    . mirabegron ER (MYRBETRIQ) 25 MG TB24 tablet Take 1 tablet (25 mg total) by mouth daily. 30 tablet 0  . potassium chloride (K-DUR,KLOR-CON) 10 MEQ tablet Take 10 mEq by mouth daily.    . ramipril (ALTACE) 5 MG capsule Take 5 mg by mouth daily.    Marland Kitchen SANTYL ointment Apply 1 application topically daily.    . simvastatin (ZOCOR) 40 MG tablet Take 40 mg by mouth every morning.     Marland Kitchen terconazole (TERAZOL 7) 0.4 % vaginal cream Place 1 applicator  vaginally at bedtime.    Tyler Aas FLEXTOUCH 200 UNIT/ML FlexTouch Pen Inject 120 Units into the skin in the morning and at bedtime.    . triamcinolone lotion (KENALOG) 0.1 % Apply 1 application topically 3 (three) times daily.      Physical Exam: General: Alert and oriented.  No apparent distress. Dressing left intact to the left lower extremity as it appears to be clean, dry, and intact and stable.  No strikethrough noted.  No pain on palpation of amputation site left foot.  Previous clinical photo status post 1 day left partial fifth ray amputation.    Results for orders placed or performed during the hospital encounter of 12/15/19 (from the past 48 hour(s))  Glucose, capillary     Status: Abnormal   Collection Time: 12/20/19  4:34 PM  Result Value Ref Range   Glucose-Capillary 286 (H) 70 - 99 mg/dL    Comment: Glucose reference range applies only to samples taken after fasting for at least 8 hours.   Comment 1 Notify RN   Glucose, capillary     Status: Abnormal   Collection Time: 12/20/19  8:57 PM  Result Value Ref Range   Glucose-Capillary 254 (H) 70 - 99 mg/dL    Comment: Glucose reference range applies only to samples taken after fasting for at least 8 hours.  CBC     Status: None   Collection Time: 12/21/19 12:03 AM  Result Value Ref Range   WBC 9.2 4.0 - 10.5 K/uL   RBC 4.23 3.87 - 5.11 MIL/uL   Hemoglobin 12.8 12.0 - 15.0 g/dL   HCT 39.0 36.0 - 46.0 %   MCV 92.2 80.0 - 100.0 fL   MCH 30.3 26.0 - 34.0 pg   MCHC 32.8 30.0 - 36.0 g/dL   RDW 13.1 11.5 - 15.5 %   Platelets 266 150 - 400 K/uL   nRBC 0.0 0.0 - 0.2 %    Comment: Performed at [facility], [address], [place], [place] [id number]  Basic metabolic panel     Status: Abnormal   Collection Time: 12/21/19 12:03 AM  Result Value Ref Range   Sodium 135 135 - 145 mmol/L   Potassium 4.9 3.5 - 5.1 mmol/L   Chloride 107 98 - 111 mmol/L   CO2 24 22 - 32 mmol/L   Glucose, Bld 216 (H) 70 - 99 mg/dL     Comment: Glucose reference range applies only to samples taken after fasting for at least 8 hours.   BUN 14 8 - 23 mg/dL   Creatinine, Ser 1.16 (H) 0.44 - 1.00 mg/dL   Calcium 8.2 (L)  8.9 - 10.3 mg/dL   GFR calc non Af Amer 48 (L) >60 mL/min   GFR calc Af Amer 56 (L) >60 mL/min   Anion gap 4 (L) 5 - 15    Comment: Performed at [facility], [address], [place], [place] [address]  Magnesium     Status: None   Collection Time: 12/21/19 12:03 AM  Result Value Ref Range   Magnesium 1.9 1.7 - 2.4 mg/dL    Comment: Performed at [facility], [address] [place], [place] [address]  Creatinine, serum     Status: Abnormal   Collection Time: 12/21/19  4:25 AM  Result Value Ref Range   Creatinine, Ser 1.12 (H) 0.44 - 1.00 mg/dL   GFR calc non Af Amer 50 (L) >60 mL/min   GFR calc Af Amer 58 (L) >60 mL/min    Comment: Performed at [facility], [address] [place], [place] [address]  Glucose, capillary     Status: Abnormal   Collection Time: 12/21/19  8:07 AM  Result Value Ref Range   Glucose-Capillary 168 (H) 70 - 99 mg/dL    Comment: Glucose reference range applies only to samples taken after fasting for at least 8 hours.  Glucose, capillary     Status: Abnormal   Collection Time: 12/21/19 11:35 AM  Result Value Ref Range   Glucose-Capillary 265 (H) 70 - 99 mg/dL    Comment: Glucose reference range applies only to samples taken after fasting for at least 8 hours.  Glucose, capillary     Status: Abnormal   Collection Time: 12/21/19  4:56 PM  Result Value Ref Range   Glucose-Capillary 110 (H) 70 - 99 mg/dL    Comment: Glucose reference range applies only to samples taken after fasting for at least 8 hours.  Glucose, capillary     Status: Abnormal   Collection Time: 12/21/19  9:11 PM  Result Value Ref Range   Glucose-Capillary 135 (H) 70 - 99 mg/dL    Comment: Glucose reference range applies only to samples taken after fasting for at least 8 hours.    Comment 1 Notify RN   Renal function panel     Status: Abnormal   Collection Time: 12/22/19  7:14 AM  Result Value Ref Range   Sodium 140 135 - 145 mmol/L   Potassium 3.8 3.5 - 5.1 mmol/L   Chloride 113 (H) 98 - 111 mmol/L   CO2 23 22 - 32 mmol/L   Glucose, Bld 68 (L) 70 - 99 mg/dL    Comment: Glucose reference range applies only to samples taken after fasting for at least 8 hours.   BUN 11 8 - 23 mg/dL   Creatinine, Ser 0.85 0.44 - 1.00 mg/dL   Calcium 8.2 (L) 8.9 - 10.3 mg/dL   Phosphorus 2.1 (L) 2.5 - 4.6 mg/dL   Albumin 2.7 (L) 3.5 - 5.0 g/dL   GFR calc non Af Amer >60 >60 mL/min   GFR calc Af Amer >60 >60 mL/min   Anion gap 4 (L) 5 - 15    Comment: Performed at [facility], [address], [place], [place] [address]  Magnesium     Status: None   Collection Time: 12/22/19  7:14 AM  Result Value Ref Range   Magnesium 1.9 1.7 - 2.4 mg/dL    Comment: Performed at [facility], [address] [place], [place] [address]  Glucose, capillary     Status: Abnormal   Collection  Time: 12/22/19  8:24 AM  Result Value Ref Range   Glucose-Capillary 57 (L) 70 - 99 mg/dL    Comment: Glucose reference range applies only to samples taken after fasting for at least 8 hours.   Comment 1 Notify RN   Glucose, capillary     Status: None   Collection Time: 12/22/19  8:48 AM  Result Value Ref Range   Glucose-Capillary 71 70 - 99 mg/dL    Comment: Glucose reference range applies only to samples taken after fasting for at least 8 hours.   Comment 1 Notify RN   Glucose, capillary     Status: Abnormal   Collection Time: 12/22/19 12:11 PM  Result Value Ref Range   Glucose-Capillary 213 (H) 70 - 99 mg/dL    Comment: Glucose reference range applies only to samples taken after fasting for at least 8 hours.   Comment 1 Notify RN    No results found.  Blood pressure (!) 158/84, pulse 75, temperature 98 F (36.7 C), temperature source Oral, resp. rate 19, height 5\' 5"  (1.651  m), weight 122.5 kg, SpO2 99 %.  Assessment 1. Acute osteomyelitis left fifth metatarsal phalangeal joint with ulceration present status post partial left fifth ray amputation -stable 2. Left foot cellulitis secondary to osteomyelitis and wound left fifth metatarsal phalangeal joint - resolved 3. Subcutaneous ulceration that goes close to tendon to the lateral aspect of the left fourth toe PIPJ 4. Diabetes type 2 uncontrolled with polyneuropathy  Plan -Patient seen and examined. -Dressing appears clean, dry, and intact.   -Appreciate recommendations for antibiotics per medicine.  Agree with transition from IV Unasyn to Augmentin for 5 more days.  Previous wound culture growing strep sensitive to penicillin, few bacteroides.  Awaiting pathology result.  Anticipate that it will be clear of infection as the distal shaft of the fifth metatarsal did not appear to be diseased. -Appreciate med recs for blood glucose control.  Patient more stable than admission. -Appreciate PT/OT recs.  Patient to continue partial weightbearing with heel contact for transfers only but otherwise needs to be nonweightbearing. -Appreciate med recs for DVT prophylaxis at discharge. -Patient is to leave the dressing clean, dry, and intact until postoperative follow-up visit in clinic.  Podiatry team to sign off at this point.  Discharge instructions placed in chart.  Caroline More, DPM 12/22/2019, 12:54 PM

## 2019-12-22 NOTE — Plan of Care (Signed)
  Problem: Education: Goal: Knowledge of General Education information will improve Description: Including pain rating scale, medication(s)/side effects and non-pharmacologic comfort measures Outcome: Progressing   Problem: Clinical Measurements: Goal: Ability to maintain clinical measurements within normal limits will improve Outcome: Progressing Goal: Will remain free from infection Outcome: Progressing Goal: Diagnostic test results will improve Outcome: Progressing Goal: Respiratory complications will improve Outcome: Progressing Goal: Cardiovascular complication will be avoided Outcome: Progressing   Problem: Activity: Goal: Risk for activity intolerance will decrease Outcome: Progressing   Problem: Coping: Goal: Level of anxiety will decrease Outcome: Progressing   Problem: Safety: Goal: Ability to remain free from injury will improve Outcome: Progressing   

## 2019-12-22 NOTE — Anesthesia Postprocedure Evaluation (Signed)
Anesthesia Post Note  Patient: Judith Shaw  Procedure(s) Performed: AMPUTATION RAY Left 5th (Left )  Patient location during evaluation: PACU Anesthesia Type: General Level of consciousness: awake and alert Pain management: pain level controlled Vital Signs Assessment: post-procedure vital signs reviewed and stable Respiratory status: spontaneous breathing, nonlabored ventilation, respiratory function stable and patient connected to nasal cannula oxygen Cardiovascular status: blood pressure returned to baseline and stable Postop Assessment: no apparent nausea or vomiting Anesthetic complications: no     Last Vitals:  Vitals:   12/21/19 1654 12/21/19 2352  BP: (!) 148/49 (!) 111/53  Pulse: 74 62  Resp: 18 17  Temp: 36.8 C 36.6 C  SpO2: 93% 97%    Last Pain:  Vitals:   12/21/19 2352  TempSrc: Oral  PainSc:                  [name]

## 2019-12-22 NOTE — Progress Notes (Signed)
PROGRESS NOTE  Judith Shaw [id number] DOB: 02/19/1951   PCP: Kirk Ruths, MD  Patient is from: Home.  Independently ambulates at baseline.  DOA: 12/15/2019 LOS: 7  Brief Narrative / Interim history: 69 year old female with history of HTN, uncontrolled DM-2, diastolic CHF, OSA and depression presented with left foot wound and diabetic ulcer and found to have osteomyelitis.  Started on IV antibiotics (vancomycin and Zosyn).  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  Tissue cultures on 4/20 with Streptococcus anginosus and few bacteriodes.  Surgical tissue culture on 4/23 NGTD.  Antibiotics descalated to IV Unasyn, then to Augmentin on 4/26 for 5 more days.  Evaluated by PT/OT.  Plan is for discharge to SNF.  Subjective: Seen and examined earlier this morning.  Was hypoglycemic to 51 earlier this morning.  No complaints.  Denies surgical site pain, chest pain, shortness of breath, GI or UTI symptoms.  Objective: Vitals:   12/21/19 0811 12/21/19 1654 12/21/19 2352 12/22/19 0824  BP: (!) 121/54 (!) 148/49 (!) 111/53 (!) 158/84  Pulse: 79 74 62 75  Resp: 16 18 17 19   Temp: 98.3 F (36.8 C) 98.3 F (36.8 C) 97.8 F (36.6 C) 98 F (36.7 C)  TempSrc:  Oral Oral Oral  SpO2: 90% 93% 97% 99%  Weight:      Height:        Intake/Output Summary (Last 24 hours) at 12/22/2019 1210 Last data filed at 12/22/2019 0900 Gross per 24 hour  Intake 1480 ml  Output 2 ml  Net 1478 ml   Filed Weights   12/15/19 1724 12/19/19 1324  Weight: 122.5 kg 122.5 kg    Examination:  GENERAL: No apparent distress.  Nontoxic. HEENT: MMM.  Vision and hearing grossly intact.  NECK: Supple.  No apparent JVD.  RESP: On room air.  No IWOB.  Fair aeration bilaterally. CVS:  RRR. Heart sounds normal.  ABD/GI/GU: Bowel sounds present. Soft. Non tender.  MSK/EXT:  Moves extremities. No apparent deformity. No edema.  SKIN: Dressing over LLE DCI. NEURO: Awake, alert and  oriented appropriately.  No apparent focal neuro deficit. PSYCH: Calm. Normal affect.  Procedures:  4/21-angiography 4/23-left fifth ray amputation  Microbiology summarized: 4/19-COVID-19 PCR negative. 4/21-MRSA PCR negative 4/20-tissue culture with Streptococcus anginosus and few bacteriodes 4/23-tissue culture NGTD.  Assessment & Plan: Diabetic left wound infection with osteomyelitis -4/21-status post arteriogram -4/23-left fifth partial ray amputation -Tissue culture on 4/20 with Streptococcus anginosus and few bacteriodes.  Tissue culture on 4/23 NGTD. - IV Zosyn and vancomycin>> IV Unasyn on 4/24> Augmentin on 4/26 for 5 more days. -Optimize blood glucose control. -Nonweightbearing except for heel contact for transfers only in surgical shoe -VTE prophylaxis for 3 weeks -Scheduled Tylenol with as needed oxygen.  Discontinue morphine. -PT/OT  Uncontrolled DM-2 with hyperglycemia and hypoglycemia: A1c 11.3%.  Tresiba Metformin at home. Recent Labs    12/21/19 2111 12/22/19 0824 12/22/19 0848  GLUCAP 135* 57* 71  -Reduce SSI to moderate and NovoLog to 6 units AC. -Continue Lantus 60 units twice daily. -Continue statin. -Could benefit from GLP-1 inhibitors given obesity  Essential hypertension: Soft blood pressures but not symptomatic. -Continue home ramipril. -Discontinue Coreg.  Chronic diastolic congestive heart failure: no echo in the system.  Appears euvolemic.  No cardiopulmonary symptoms. -Continue home medications -Monitor fluid status -Sodium and fluid restrictions.  Dementia without behavioral disturbance: Recent diagnosis.   -Continue home Aricept  -Frequent reorientation and delirium precautions.  History of depression:  Stable. -Continue on Cymbalta.  Hyperlipidemia - Continue statin  Obstructive sleep apnea-does not use CPAP at home.  Refuses CPAP.  Morbid obesity: Body mass index is 44.94 kg/m. -Encourage lifestyle change to lose  weight. -Could benefit from GLP-1 inhibitors in the setting of diabetes.                   DVT prophylaxis: Subcu heparin Code Status: Full code Family Communication: Patient and/or RN. Available if any question.   Discharge barrier: SNF bed.  Medically stable. Patient is from: Home Final disposition: SNF when bed available, likely in the next 24 to 48 hours.  Consultants:  Podiatry   Sch Meds:  Scheduled Meds: . acetaminophen  1,000 mg Oral Q8H  . amoxicillin-clavulanate  1 tablet Oral BID  . aspirin EC  81 mg Oral Daily  . baclofen  5 mg Oral QHS  . carvedilol  3.125 mg Oral BID WC  . donepezil  5 mg Oral Daily  . DULoxetine  30 mg Oral Daily  . heparin injection (subcutaneous)  5,000 Units Subcutaneous Q8H  . insulin aspart  0-15 Units Subcutaneous TID WC  . insulin aspart  0-5 Units Subcutaneous QHS  . insulin aspart  6 Units Subcutaneous TID WC  . insulin glargine  60 Units Subcutaneous BID  . mirabegron ER  25 mg Oral Daily  . ramipril  5 mg Oral Daily  . saccharomyces boulardii  250 mg Oral BID  . simvastatin  40 mg Oral q morning - 10a   Continuous Infusions:  PRN Meds:.magnesium hydroxide, morphine injection, ondansetron **OR** ondansetron (ZOFRAN) IV, ondansetron (ZOFRAN) IV, oxyCODONE, traZODone  Antimicrobials: Anti-infectives (From admission, onward)   Start     Dose/Rate Route Frequency Ordered Stop   12/22/19 2000  amoxicillin-clavulanate (AUGMENTIN) 875-125 MG per tablet 1 tablet     1 tablet Oral 2 times daily 12/22/19 1041 12/27/19 1959   12/20/19 1800  Ampicillin-Sulbactam (UNASYN) 3 g in sodium chloride 0.9 % 100 mL IVPB  Status:  Discontinued     3 g 200 mL/hr over 30 Minutes Intravenous Every 6 hours 12/20/19 1537 12/22/19 1041   12/18/19 2200  vancomycin (VANCOREADY) IVPB 1500 mg/300 mL  Status:  Discontinued     1,500 mg 150 mL/hr over 120 Minutes Intravenous Every 24 hours 12/18/19 0725 12/20/19 1537   12/17/19 1545  ceFAZolin  (ANCEF) IVPB 2g/100 mL premix  Status:  Discontinued    Note to Pharmacy: To be given in specials   2 g 200 mL/hr over 30 Minutes Intravenous  Once 12/17/19 1531 12/17/19 1848   12/17/19 1531  ceFAZolin (ANCEF) 2-4 GM/100ML-% IVPB    Note to Pharmacy: Rozanna Box   : cabinet override      12/17/19 1531 12/18/19 0344   12/16/19 2300  vancomycin (VANCOREADY) IVPB 1250 mg/250 mL  Status:  Discontinued     1,250 mg 166.7 mL/hr over 90 Minutes Intravenous Every 24 hours 12/15/19 2302 12/18/19 0725   12/16/19 0900  piperacillin-tazobactam (ZOSYN) IVPB 3.375 g  Status:  Discontinued     3.375 g 12.5 mL/hr over 240 Minutes Intravenous Every 8 hours 12/16/19 0528 12/20/19 1537   12/15/19 2300  vancomycin (VANCOCIN) IVPB 1000 mg/200 mL premix  Status:  Discontinued     1,000 mg 200 mL/hr over 60 Minutes Intravenous  Once 12/15/19 2135 12/15/19 2244   12/15/19 2245  vancomycin (VANCOREADY) IVPB 2000 mg/400 mL     2,000 mg 200 mL/hr over 120 Minutes Intravenous  Once 12/15/19 2244 12/16/19 0104   12/15/19 2245  piperacillin-tazobactam (ZOSYN) IVPB 3.375 g  Status:  Discontinued     3.375 g 12.5 mL/hr over 240 Minutes Intravenous Every 8 hours 12/15/19 2244 12/16/19 0528   12/15/19 2230  piperacillin-tazobactam (ZOSYN) IVPB 3.375 g  Status:  Discontinued     3.375 g 100 mL/hr over 30 Minutes Intravenous  Once 12/15/19 2227 12/15/19 2244   12/15/19 2230  vancomycin (VANCOCIN) IVPB 1000 mg/200 mL premix  Status:  Discontinued     1,000 mg 200 mL/hr over 60 Minutes Intravenous  Once 12/15/19 2227 12/15/19 2244   12/15/19 2130  piperacillin-tazobactam (ZOSYN) IVPB 3.375 g  Status:  Discontinued     3.375 g 100 mL/hr over 30 Minutes Intravenous  Once 12/15/19 2120 12/15/19 2244   12/15/19 2130  vancomycin (VANCOCIN) IVPB 1000 mg/200 mL premix  Status:  Discontinued     1,000 mg 200 mL/hr over 60 Minutes Intravenous  Once 12/15/19 2120 12/15/19 2244       I have personally reviewed the  following labs and images: CBC: Recent Labs  Lab 12/15/19 1733 12/16/19 0436 12/18/19 0505 12/21/19 0003  WBC 7.4 7.4 7.1 9.2  NEUTROABS 5.2  --   --   --   HGB 14.2 13.4 13.8 12.8  HCT 42.7 39.2 41.3 39.0  MCV 90.5 88.3 91.0 92.2  PLT 241 225 295 266   BMP &GFR Recent Labs  Lab 12/16/19 0436 12/16/19 0436 12/17/19 0513 12/17/19 0513 12/18/19 0505 12/18/19 0505 12/19/19 0351 12/20/19 0847 12/21/19 0003 12/21/19 0425 12/22/19 0714  NA 138  --  138  --  137  --   --   --  135  --  140  K 4.2  --  4.7  --  4.0  --   --   --  4.9  --  3.8  CL 105  --  106  --  106  --   --   --  107  --  113*  CO2 26  --  25  --  25  --   --   --  24  --  23  GLUCOSE 147*  --  231*  --  217*  --   --   --  216*  --  68*  BUN 25*  --  20  --  14  --   --   --  14  --  11  CREATININE 1.09*   < > 0.99   < > 0.92   < > 1.07* 1.03* 1.16* 1.12* 0.85  CALCIUM 8.2*  --  8.3*  --  8.0*  --   --   --  8.2*  --  8.2*  MG  --   --   --   --   --   --   --   --  1.9  --  1.9  PHOS  --   --   --   --   --   --   --   --   --   --  2.1*   < > = values in this interval not displayed.   Estimated Creatinine Clearance: 82 mL/min (by C-G formula based on SCr of 0.85 mg/dL). Liver & Pancreas: Recent Labs  Lab 12/15/19 1733 12/22/19 0714  AST 22  --   ALT 25  --   ALKPHOS 81  --   BILITOT 0.5  --   PROT 7.2  --  ALBUMIN 3.1* 2.7*   No results for input(s): LIPASE, AMYLASE in the last 168 hours. No results for input(s): AMMONIA in the last 168 hours. Diabetic: No results for input(s): HGBA1C in the last 72 hours. Recent Labs  Lab 12/21/19 1135 12/21/19 1656 12/21/19 2111 12/22/19 0824 12/22/19 0848  GLUCAP 265* 110* 135* 57* 71   Cardiac Enzymes: No results for input(s): CKTOTAL, CKMB, CKMBINDEX, TROPONINI in the last 168 hours. No results for input(s): PROBNP in the last 8760 hours. Coagulation Profile: No results for input(s): INR, PROTIME in the last 168 hours. Thyroid Function  Tests: No results for input(s): TSH, T4TOTAL, FREET4, T3FREE, THYROIDAB in the last 72 hours. Lipid Profile: No results for input(s): CHOL, HDL, LDLCALC, TRIG, CHOLHDL, LDLDIRECT in the last 72 hours. Anemia Panel: No results for input(s): VITAMINB12, FOLATE, FERRITIN, TIBC, IRON, RETICCTPCT in the last 72 hours. Urine analysis:    Component Value Date/Time   COLORURINE YELLOW (A) 05/18/2019 0010   APPEARANCEUR CLOUDY (A) 05/18/2019 0010   APPEARANCEUR Cloudy 05/20/2012 0116   LABSPEC 1.016 05/18/2019 0010   LABSPEC 1.025 05/20/2012 0116   PHURINE 6.0 05/18/2019 0010   GLUCOSEU >=500 (A) 05/18/2019 0010   GLUCOSEU >=500 05/20/2012 0116   HGBUR SMALL (A) 05/18/2019 0010   BILIRUBINUR NEGATIVE 05/18/2019 0010   BILIRUBINUR Negative 05/20/2012 0116   KETONESUR NEGATIVE 05/18/2019 0010   PROTEINUR NEGATIVE 05/18/2019 0010   NITRITE NEGATIVE 05/18/2019 0010   LEUKOCYTESUR LARGE (A) 05/18/2019 0010   LEUKOCYTESUR 2+ 05/20/2012 0116   Sepsis Labs: Invalid input(s): PROCALCITONIN, [place]  Microbiology: Recent Results (from the past 240 hour(s))  SARS CORONAVIRUS 2 (TAT 6-24 HRS) Nasopharyngeal Nasopharyngeal Swab     Status: None   Collection Time: 12/15/19  9:57 PM   Specimen: Nasopharyngeal Swab  Result Value Ref Range Status   SARS Coronavirus 2 NEGATIVE NEGATIVE Final    Comment: (NOTE) SARS-CoV-2 target nucleic acids are NOT DETECTED. The SARS-CoV-2 RNA is generally detectable in upper and lower respiratory specimens during the acute phase of infection. Negative results do not preclude SARS-CoV-2 infection, do not rule out co-infections with other pathogens, and should not be used as the sole basis for treatment or other patient management decisions. Negative results must be combined with clinical observations, patient history, and epidemiological information. The expected result is Negative. Fact Sheet for Patients: SugarRoll.be Fact  Sheet for Healthcare Providers: https://www.woods-mathews.com/ This test is not yet approved or cleared by the [place] FDA and  has been authorized for detection and/or diagnosis of SARS-CoV-2 by FDA under an Emergency Use Authorization (EUA). This EUA will remain  in effect (meaning this test can be used) for the duration of the COVID-19 declaration under Section 56 4(b)(1) of the Act, 21 U.S.C. section 360bbb-3(b)(1), unless the authorization is terminated or revoked sooner. Performed at [facility], [place] [address] [place], [place] [address]   Aerobic/Anaerobic Culture (surgical/deep wound)     Status: None   Collection Time: 12/16/19  3:35 PM   Specimen: Wound  Result Value Ref Range Status   Specimen Description   Final    WOUND Performed at [facility], [address] [place], [place] [address]    Special Requests   Final    NONE Performed at [facility], [place] [place], [place] [address]    Gram Stain   Final    NO WBC SEEN ABUNDANT GRAM POSITIVE COCCI FEW GRAM NEGATIVE RODS    Culture   Final  MODERATE STREPTOCOCCUS ANGINOSIS FEW BACTEROIDES THETAIOTAOMICRON BETA LACTAMASE POSITIVE Performed at [facility], [place] [address], [place], [place] [address]    Report Status 12/19/2019 FINAL  Final   Organism ID, Bacteria STREPTOCOCCUS ANGINOSIS  Final      Susceptibility   Streptococcus anginosis - MIC*    PENICILLIN 0.12 SENSITIVE Sensitive     CEFTRIAXONE 0.5 SENSITIVE Sensitive     ERYTHROMYCIN >=8 RESISTANT Resistant     LEVOFLOXACIN 0.5 SENSITIVE Sensitive     VANCOMYCIN 0.5 SENSITIVE Sensitive     * MODERATE STREPTOCOCCUS ANGINOSIS  Surgical pcr screen     Status: Abnormal   Collection Time: 12/17/19  8:53 AM   Specimen: Nasal Mucosa; Nasal Swab  Result Value Ref Range Status   MRSA, PCR NEGATIVE NEGATIVE Final   Staphylococcus aureus POSITIVE (A) NEGATIVE Final    Comment: (NOTE) The Xpert SA  Assay (FDA approved for NASAL specimens in patients 35 years of age and older), is one component of a comprehensive surveillance program. It is not intended to diagnose infection nor to guide or monitor treatment. Performed at [facility], [address] [place], [place] [address]   Aerobic/Anaerobic Culture (surgical/deep wound)     Status: None (Preliminary result)   Collection Time: 12/19/19  2:26 PM   Specimen: Wound  Result Value Ref Range Status   Specimen Description   Final    WOUND Performed at [facility], [address], [place], [place] [address]    Special Requests   Final    NONE Performed at [facility], [place] [place], [place] [address]    Gram Stain   Final    RARE WBC PRESENT, PREDOMINANTLY PMN NO ORGANISMS SEEN    Culture   Final    NO GROWTH 2 DAYS NO ANAEROBES ISOLATED; CULTURE IN PROGRESS FOR 5 DAYS Performed at [facility], [place] [address], [place], [place] [address]    Report Status PENDING  Incomplete    Radiology Studies: No results found.   Randle Shatzer T. [place]  If 7PM-7AM, please contact night-coverage [email or web address] Password [facility] 12/22/2019, 12:10 PM

## 2019-12-22 NOTE — Care Management Important Message (Signed)
Important Message  Patient Details  Name: Judith Shaw MRN: [id number] Date of Birth: 1951-05-16   Medicare Important Message Given:  Yes     [name] 12/22/2019, 11:40 AM

## 2019-12-22 NOTE — Progress Notes (Addendum)
Occupational Therapy Treatment Patient Details Name: Judith Shaw MRN: [id number] DOB: [birth year] Today's Date: 12/22/2019    History of present illness Pt. is a 69 y.o. female who presents with a nonhealing ulceration to the lateral aspect of the fifth metatarsal phalangeal joint.  Patient was seen in clinic earlier in the week due to evidence of osteomyelitis on x-ray to the fifth metatarsal phalangeal joint and a wound that probes to bone at the lateral aspect of the joint.  Patient also has corresponding cellulitic changes to the left foot in which she has been treated with outpatient antibiotics with but it continues to recur. Partial 5th ray amputation 12/19/19 with NWB precautions   OT comments  Pt. is NWB on the left. Pt. education was provided about A/E use for LE ADLs. Pt. education was provided about NWB status during IADL tasks at home, home routines, home set-up, layout, and anticipated needs at home. Pt. Continues to benefit from OT services for ADL training, A/E training, and pt. education about home modification, and DME. Pt. Plans to return home upon discharge with family to assist pt. as needed. Pt. reports that her brother is at is at home, and is able to help however he is currently on vacation. Pt. Continues to be appropriate for follow-up HHOT services upon discharge.   Follow Up Recommendations  Home health OT;Supervision/Assistance - 24 hour    Equipment Recommendations  None recommended by OT    Recommendations for Other Services      Precautions / Restrictions Precautions Precautions: Fall Required Braces or Orthoses: Other Brace Other Brace: post-op shoe Restrictions LLE Weight Bearing: Non weight bearing       Mobility Bed Mobility      Pt. Seen at bedlevel            Transfers                 General transfer comment: Deferred    Balance                                           ADL either performed or assessed with  clinical judgement   ADL Overall ADL's : Needs assistance/impaired Eating/Feeding: Set up;Independent   Grooming: Set up;Independent;Sitting   Upper Body Bathing: Set up   Lower Body Bathing: Set up;Maximal assistance   Upper Body Dressing : Set up   Lower Body Dressing: Set up;Maximal assistance                       Vision Baseline Vision/History: Wears glasses Wears Glasses: Reading only     Perception     Praxis      Cognition Arousal/Alertness: Awake/alert Behavior During Therapy: WFL for tasks assessed/performed Overall Cognitive Status: Within Functional Limits for tasks assessed                                          Exercises     Shoulder Instructions       General Comments      Pertinent Vitals/ Pain       Pain Assessment: No/denies pain Pain Score: 0-No pain  Home Living  Prior Functioning/Environment              Frequency  Min 2X/week        Progress Toward Goals  OT Goals(current goals can now be found in the care plan section)  Progress towards OT goals: Progressing toward goals  Acute Rehab OT Goals Patient Stated Goal: go home OT Goal Formulation: With patient Potential to Achieve Goals: Good  Plan Discharge plan remains appropriate    Co-evaluation                 AM-PAC OT "6 Clicks" Daily Activity     Outcome Measure   Help from another person eating meals?: None Help from another person taking care of personal grooming?: None Help from another person toileting, which includes using toliet, bedpan, or urinal?: A Lot Help from another person bathing (including washing, rinsing, drying)?: A Lot Help from another person to put on and taking off regular upper body clothing?: None Help from another person to put on and taking off regular lower body clothing?: A Lot 6 Click Score: 18    End of Session Equipment Utilized During  Treatment: Rolling walker;Other (comment)  OT Visit Diagnosis: Unsteadiness on feet (R26.81);Other abnormalities of gait and mobility (R26.89)   Activity Tolerance Patient tolerated treatment well   Patient Left in bed;with call bell/phone within reach;with bed alarm set   Nurse Communication          Time: 1043-1100 OT Time Calculation (min): 17 min  Charges: OT Evaluation $OT Eval Low Complexity: 1 Low OT Treatments $Self Care/Home Management : 8-22 mins Harrel Carina, MS, OTR/L  Harrel Carina 12/22/2019, 11:22 AM

## 2019-12-22 NOTE — Progress Notes (Signed)
Inpatient Diabetes Program Recommendations  AACE/ADA: New Consensus Statement on Inpatient Glycemic Control (2015)  Target Ranges:  Prepandial:   less than 140 mg/dL      Peak postprandial:   less than 180 mg/dL (1-2 hours)      Critically ill patients:  140 - 180 mg/dL   Results for CIARAN, IMDIEKE (MRN [id number]) as of 12/22/2019 10:08  Ref. Range 12/21/2019 08:07 12/21/2019 11:35 12/21/2019 16:56 12/21/2019 21:11  Glucose-Capillary Latest Ref Range: 70 - 99 mg/dL 168 (H)  12 units NOVOLOG  265 (H)  19 units NOVOLOG +  60 units LANTUS  110 (H) 135 (H)     60 units LANTUS   Results for ALLIA, HIEGEL (MRN [id number]) as of 12/22/2019 10:08  Ref. Range 12/22/2019 08:24  Glucose-Capillary Latest Ref Range: 70 - 99 mg/dL 57 (L)   Home DM Meds:Tresiba 120 units BID Humalog 60 units TID with meals Humalog 5 units for every 50 mg/dl above Target CBG of 150 mg/dl Metformin 500 mg Daily   Current Orders: Lantus 60 units BID      Novolog Resistant Correction Scale/ SSI (0-20 units) TID AC + HS      Novolog 8 units TID with meals     MD- Note patient with Hypoglycemia this AM.  Suspect the Lantus was the causative factor.  Please consider reducing Lantus to 55 units BID    --Will follow patient during hospitalization--  Wyn Quaker RN, MSN, CDE Diabetes Coordinator Inpatient Glycemic Control Team Team Pager: [phone] (8a-5p)

## 2019-12-22 NOTE — TOC Progression Note (Signed)
Transition of Care [facility]) - Progression Note    Patient Details  Name: MAESON GRIFFON MRN: [id number] Date of Birth: 1951-07-17  Transition of Care [facility] CM/SW [place], RN Phone Number: 12/22/2019, 2:05 PM  Clinical Narrative:    Received a call from [place] with auth approval, Ref number [id number] next review date 4/28 Jones Skene is the coordinator, Notified Claiborne Billings with [facility] and Erroll Luna called, I reviewed the information with her, she stated she wanted the patient to have the 2nd covid vaccine her, I explained that we did not give the covid vaccine here at the hospital she stated she would call the doctor,       Expected Discharge Plan and Services                                                 Social Determinants of Health (SDOH) Interventions    Readmission Risk Interventions No flowsheet data found.

## 2019-12-22 NOTE — TOC Progression Note (Signed)
Transition of Care [facility]) - Progression Note    Patient Details  Name: Judith Shaw MRN: [id number] Date of Birth: 1951/04/23  Transition of Care [facility]) CM/SW [place], RN Phone Number: 12/22/2019, 3:07 PM  Clinical Narrative:    Damaris Schooner with [employer] the sister and she stated that she did not want to accept the bed offer from [facility], She said it is across town and not convenient for the family to visit, I explained that there are no visitors in most facilities, she stated she wanted her to go to [facility], I explained that they declined the bed request, she stated that she would call the facility and get them to accept.  I let her know I will have to start insurance again so I need to know ASAP.  She will return my call.  She asked me to notify the physician that the patient had her first [place] on 3/29 and the 2nd is due 3 days ago, I notified the physician.        Expected Discharge Plan and Services                                                 Social Determinants of Health (SDOH) Interventions    Readmission Risk Interventions No flowsheet data found.

## 2019-12-22 NOTE — Discharge Instructions (Signed)
[place]  POST OPERATIVE INSTRUCTIONS FOR DR. [place]   1. Take your medication as prescribed.  Pain medication should be taken only as needed.  Take antibiotics as prescribed until gone.  Take blood thinner medication as prescribed, if prescribed any, until you are able to start walking to circulate your blood better.  2. Keep the dressing clean, dry and intact until next appointment.  If dressing becomes loosened or saturated then the dressing can be changed by applying Xeroform or Adaptic to the incision line in the wound to the left fourth toe followed by 4 x 4 gauze, ABD pad, Kerlix, and Ace wrap, otherwise dressing should stay on that was placed in the hospital until patient is seen again in clinic.  3. Keep your foot elevated above the heart level for the first 48 hours.  4. Remain for the most part nonweightbearing to the left lower extremity all times.  Okay to use the heel for transfers only but needs to wear surgical shoe when doing so.  Otherwise patient is to remain nonweightbearing at all times to ensure proper incision healing.  5. Do not take a shower. Baths are permissible as long as the foot is kept out of the water.   6. Every hour you are awake:  - Bend your knee 15 times. - Flex foot 15 times - Massage calf 15 times  7. Call [facility] [phone]) if any of the following problems occur: - You develop a temperature or fever. - The bandage becomes saturated with blood. - Medication does not stop your pain. - Injury of the foot occurs. - Any symptoms of infection including redness, odor, or red streaks running from wound.

## 2019-12-23 DIAGNOSIS — F0390 Unspecified dementia without behavioral disturbance: Secondary | ICD-10-CM | POA: Diagnosis not present

## 2019-12-23 DIAGNOSIS — M86172 Other acute osteomyelitis, left ankle and foot: Secondary | ICD-10-CM | POA: Diagnosis not present

## 2019-12-23 DIAGNOSIS — I5032 Chronic diastolic (congestive) heart failure: Secondary | ICD-10-CM | POA: Diagnosis not present

## 2019-12-23 DIAGNOSIS — I10 Essential (primary) hypertension: Secondary | ICD-10-CM | POA: Diagnosis not present

## 2019-12-23 LAB — GLUCOSE, CAPILLARY
Glucose-Capillary: 136 mg/dL — ABNORMAL HIGH (ref 70–99)
Glucose-Capillary: 157 mg/dL — ABNORMAL HIGH (ref 70–99)
Glucose-Capillary: 181 mg/dL — ABNORMAL HIGH (ref 70–99)
Glucose-Capillary: 175 mg/dL — ABNORMAL HIGH (ref 70–99)

## 2019-12-23 LAB — SURGICAL PATHOLOGY

## 2019-12-23 MED ORDER — INSULIN ASPART 100 UNIT/ML ~~LOC~~ SOLN
8.0000 [IU] | Freq: Three times a day (TID) | SUBCUTANEOUS | Status: DC
  Administered 2019-12-23 (×3): 8 [IU] via SUBCUTANEOUS
  Filled 2019-12-23 (×3): qty 1

## 2019-12-23 NOTE — TOC Progression Note (Signed)
Transition of Care [facility]) - Progression Note    Patient Details  Name: Judith Shaw MRN: [id number] Date of Birth: 25-Nov-1950  Transition of Care [facility]) CM/SW [place], RN Phone Number: 12/23/2019, 8:56 AM  Clinical Narrative:    I reached back out to [facility] the patient's sister as she was to call me back about the conversation she had with Magda Paganini at [facility], She stated that she did speak with Magda Paganini at length.  I resent the request for a bed back to [facility] at the sister's request, I reached out to [place] at [facility] and she stated that she would take a look at it and let me know, I explained to both the sister and to [place] that we would have to get the facility changed with the insurance to get approval again.         Expected Discharge Plan and Services                                                 Social Determinants of Health (SDOH) Interventions    Readmission Risk Interventions No flowsheet data found.

## 2019-12-23 NOTE — TOC Progression Note (Signed)
Transition of Care [facility]) - Progression Note    Patient Details  Name: Judith Shaw MRN: [id number] Date of Birth: 1951/03/15  Transition of Care [facility]) CM/SW [place], RN Phone Number: 12/23/2019, 11:44 AM  Clinical Narrative:     Spoke with the sister Larna Daughters and interim legal guardian she chose the bed for [facility], I notified Tammy at [facility] and called [place] to switch the facility to [facility] ref number [id number], talked to [facility] with [facility] and she will change the facility to [facility] same ref number, the auth is good until tomorrow as long as she goes end of day tomorrow, Jones Skene is the coord., I notified Tammy at [facility]      Expected Discharge Plan and Services                                                 Social Determinants of Health (SDOH) Interventions    Readmission Risk Interventions No flowsheet data found.

## 2019-12-23 NOTE — Progress Notes (Signed)
PROGRESS NOTE  Judith Shaw [id number] DOB: [birth year]   PCP: Kirk Ruths, MD  Patient is from: Home.  Independently ambulates at baseline.  DOA: 12/15/2019 LOS: 8  Brief Narrative / Interim history: 69 year old female with history of HTN, uncontrolled DM-2, diastolic CHF, OSA and depression presented with left foot wound and diabetic ulcer and found to have osteomyelitis.  Started on IV antibiotics (vancomycin and Zosyn).  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  Tissue cultures on 4/20 with Streptococcus anginosus and few bacteriodes.  Surgical tissue culture on 4/23 NGTD.  Antibiotics descalated to IV Unasyn, then to Augmentin on 4/26 for 5 more days.  Evaluated by PT/OT.  Plan is for discharge to SNF.  Patient was tunrned down by [facility] but patient's sister insist patient goes to [facility]. She refused [facility]. TOC reaching out to [facility] again.  Subjective: Seen and examined earlier this morning.  No major events overnight of this morning.  No complaints.  Pain fairly controlled.  She denies chest pain, dyspnea, GI or UTI symptoms.  Objective: Vitals:   12/22/19 0824 12/22/19 1558 12/22/19 2344 12/23/19 0802  BP: (!) 158/84 (!) 151/67 (!) 158/56 (!) 176/84  Pulse: 75 69 80 79  Resp: 19 17 20 17   Temp: 98 F (36.7 C) 98.3 F (36.8 C)  98.2 F (36.8 C)  TempSrc: Oral Oral  Oral  SpO2: 99% 95% (!) 89% 95%  Weight:      Height:        Intake/Output Summary (Last 24 hours) at 12/23/2019 1138 Last data filed at 12/23/2019 0945 Gross per 24 hour  Intake 510 ml  Output 1200 ml  Net -690 ml   Filed Weights   12/15/19 1724 12/19/19 1324  Weight: 122.5 kg 122.5 kg    Examination:  GENERAL: No apparent distress.  Nontoxic. HEENT: MMM.  Vision and hearing grossly intact.  NECK: Supple.  No apparent JVD.  RESP:  No IWOB.  Fair aeration bilaterally. CVS:  RRR. Heart sounds normal.  ABD/GI/GU: Bowel sounds present.  Soft. Non tender.  MSK/EXT:  Moves extremities. No apparent deformity. No edema.  SKIN: Dressing over LLE DCI. NEURO: Awake, alert and oriented appropriately.  No apparent focal neuro deficit. PSYCH: Calm. Normal affect.  Procedures:  4/21-angiography 4/23-left fifth ray amputation  Microbiology summarized: 4/19-COVID-19 PCR negative. 4/21-MRSA PCR negative 4/20-tissue culture with Streptococcus anginosus and few bacteriodes 4/23-tissue culture NGTD.  Assessment & Plan: Diabetic left wound infection with osteomyelitis -4/21-status post arteriogram -4/23-left fifth partial ray amputation -Tissue culture on 4/20 with Streptococcus anginosus and few bacteriodes.  Tissue culture on 4/23 NGTD. - IV Zosyn and vancomycin>> IV Unasyn on 4/24> Augmentin on 4/26 for 5 more days. -Optimize blood glucose control. -Nonweightbearing except for heel contact for transfers only in surgical shoe -VTE prophylaxis for 3 weeks -Scheduled Tylenol with as needed oxygen.  -Waiting on SNF bed  Uncontrolled DM-2 with hyperglycemia and hypoglycemia: A1c 11.3%.  Tresiba Metformin at home. Recent Labs    12/22/19 1651 12/22/19 2043 12/23/19 0753  GLUCAP 124* 223* 136*  -Continue SSI moderate.  Increase NovoLog to 8 unit AC -Continue Lantus 60 units twice daily. -Continue statin. -Could benefit from GLP-1 inhibitors given obesity  Essential hypertension: Normotensive. -Continue home ramipril. -Discontinued Coreg due to hypotension.  Chronic diastolic congestive heart failure: no echo in the system.  Appears euvolemic.  No cardiopulmonary symptoms. -Continue home medications -Monitor fluid status -Sodium and fluid restrictions.  Dementia without behavioral disturbance: Recent diagnosis.   -Continue home Aricept  -Frequent reorientation and delirium precautions.  History of depression: Stable. -Continue on Cymbalta.  Hyperlipidemia - Continue statin  Obstructive sleep apnea-does not use  CPAP at home.  Refuses CPAP.  Morbid obesity: Body mass index is 44.94 kg/m. -Encourage lifestyle change to lose weight. -Could benefit from GLP-1 inhibitors in the setting of diabetes.                   DVT prophylaxis: Subcu heparin Code Status: Full code Family Communication: Patient and/or RN. Available if any question.   Discharge barrier: SNF bed.  Medically stable. Patient is from: Home Final disposition: SNF when bed available, likely in the next 24 to 48 hours.  Consultants:  Podiatry   Sch Meds:  Scheduled Meds: . acetaminophen  1,000 mg Oral Q8H  . amoxicillin-clavulanate  1 tablet Oral BID  . aspirin EC  81 mg Oral Daily  . baclofen  5 mg Oral QHS  . carvedilol  3.125 mg Oral BID WC  . donepezil  5 mg Oral Daily  . DULoxetine  30 mg Oral Daily  . enoxaparin (LOVENOX) injection  40 mg Subcutaneous Q24H  . insulin aspart  0-15 Units Subcutaneous TID WC  . insulin aspart  0-5 Units Subcutaneous QHS  . insulin aspart  8 Units Subcutaneous TID WC  . insulin glargine  60 Units Subcutaneous BID  . mirabegron ER  25 mg Oral Daily  . ramipril  5 mg Oral Daily  . saccharomyces boulardii  250 mg Oral BID  . simvastatin  40 mg Oral q morning - 10a   Continuous Infusions:  PRN Meds:.magnesium hydroxide, morphine injection, ondansetron **OR** ondansetron (ZOFRAN) IV, ondansetron (ZOFRAN) IV, oxyCODONE, traZODone  Antimicrobials: Anti-infectives (From admission, onward)   Start     Dose/Rate Route Frequency Ordered Stop   12/22/19 2000  amoxicillin-clavulanate (AUGMENTIN) 875-125 MG per tablet 1 tablet     1 tablet Oral 2 times daily 12/22/19 1041 12/27/19 1959   12/22/19 0000  amoxicillin-clavulanate (AUGMENTIN) 875-125 MG tablet     1 tablet Oral 2 times daily 12/22/19 1455     12/20/19 1800  Ampicillin-Sulbactam (UNASYN) 3 g in sodium chloride 0.9 % 100 mL IVPB  Status:  Discontinued     3 g 200 mL/hr over 30 Minutes Intravenous Every 6 hours 12/20/19  1537 12/22/19 1041   12/18/19 2200  vancomycin (VANCOREADY) IVPB 1500 mg/300 mL  Status:  Discontinued     1,500 mg 150 mL/hr over 120 Minutes Intravenous Every 24 hours 12/18/19 0725 12/20/19 1537   12/17/19 1545  ceFAZolin (ANCEF) IVPB 2g/100 mL premix  Status:  Discontinued    Note to Pharmacy: To be given in specials   2 g 200 mL/hr over 30 Minutes Intravenous  Once 12/17/19 1531 12/17/19 1848   12/17/19 1531  ceFAZolin (ANCEF) 2-4 GM/100ML-% IVPB    Note to Pharmacy: Rozanna Box   : cabinet override      12/17/19 1531 12/18/19 0344   12/16/19 2300  vancomycin (VANCOREADY) IVPB 1250 mg/250 mL  Status:  Discontinued     1,250 mg 166.7 mL/hr over 90 Minutes Intravenous Every 24 hours 12/15/19 2302 12/18/19 0725   12/16/19 0900  piperacillin-tazobactam (ZOSYN) IVPB 3.375 g  Status:  Discontinued     3.375 g 12.5 mL/hr over 240 Minutes Intravenous Every 8 hours 12/16/19 0528 12/20/19 1537   12/15/19 2300  vancomycin (VANCOCIN) IVPB 1000 mg/200 mL premix  Status:  Discontinued     1,000 mg 200 mL/hr over 60 Minutes Intravenous  Once 12/15/19 2135 12/15/19 2244   12/15/19 2245  vancomycin (VANCOREADY) IVPB 2000 mg/400 mL     2,000 mg 200 mL/hr over 120 Minutes Intravenous  Once 12/15/19 2244 12/16/19 0104   12/15/19 2245  piperacillin-tazobactam (ZOSYN) IVPB 3.375 g  Status:  Discontinued     3.375 g 12.5 mL/hr over 240 Minutes Intravenous Every 8 hours 12/15/19 2244 12/16/19 0528   12/15/19 2230  piperacillin-tazobactam (ZOSYN) IVPB 3.375 g  Status:  Discontinued     3.375 g 100 mL/hr over 30 Minutes Intravenous  Once 12/15/19 2227 12/15/19 2244   12/15/19 2230  vancomycin (VANCOCIN) IVPB 1000 mg/200 mL premix  Status:  Discontinued     1,000 mg 200 mL/hr over 60 Minutes Intravenous  Once 12/15/19 2227 12/15/19 2244   12/15/19 2130  piperacillin-tazobactam (ZOSYN) IVPB 3.375 g  Status:  Discontinued     3.375 g 100 mL/hr over 30 Minutes Intravenous  Once 12/15/19 2120 12/15/19  2244   12/15/19 2130  vancomycin (VANCOCIN) IVPB 1000 mg/200 mL premix  Status:  Discontinued     1,000 mg 200 mL/hr over 60 Minutes Intravenous  Once 12/15/19 2120 12/15/19 2244       I have personally reviewed the following labs and images: CBC: Recent Labs  Lab 12/18/19 0505 12/21/19 0003  WBC 7.1 9.2  HGB 13.8 12.8  HCT 41.3 39.0  MCV 91.0 92.2  PLT 295 266   BMP &GFR Recent Labs  Lab 12/17/19 0513 12/17/19 0513 12/18/19 0505 12/18/19 0505 12/19/19 0351 12/20/19 0847 12/21/19 0003 12/21/19 0425 12/22/19 0714  NA 138  --  137  --   --   --  135  --  140  K 4.7  --  4.0  --   --   --  4.9  --  3.8  CL 106  --  106  --   --   --  107  --  113*  CO2 25  --  25  --   --   --  24  --  23  GLUCOSE 231*  --  217*  --   --   --  216*  --  68*  BUN 20  --  14  --   --   --  14  --  11  CREATININE 0.99   < > 0.92   < > 1.07* 1.03* 1.16* 1.12* 0.85  CALCIUM 8.3*  --  8.0*  --   --   --  8.2*  --  8.2*  MG  --   --   --   --   --   --  1.9  --  1.9  PHOS  --   --   --   --   --   --   --   --  2.1*   < > = values in this interval not displayed.   Estimated Creatinine Clearance: 82 mL/min (by C-G formula based on SCr of 0.85 mg/dL). Liver & Pancreas: Recent Labs  Lab 12/22/19 0714  ALBUMIN 2.7*   No results for input(s): LIPASE, AMYLASE in the last 168 hours. No results for input(s): AMMONIA in the last 168 hours. Diabetic: No results for input(s): HGBA1C in the last 72 hours. Recent Labs  Lab 12/22/19 0848 12/22/19 1211 12/22/19 1651 12/22/19 2043 12/23/19 0753  GLUCAP 71 213* 124* 223* 136*   Cardiac Enzymes:  No results for input(s): CKTOTAL, CKMB, CKMBINDEX, TROPONINI in the last 168 hours. No results for input(s): PROBNP in the last 8760 hours. Coagulation Profile: No results for input(s): INR, PROTIME in the last 168 hours. Thyroid Function Tests: No results for input(s): TSH, T4TOTAL, FREET4, T3FREE, THYROIDAB in the last 72 hours. Lipid Profile: No  results for input(s): CHOL, HDL, LDLCALC, TRIG, CHOLHDL, LDLDIRECT in the last 72 hours. Anemia Panel: No results for input(s): VITAMINB12, FOLATE, FERRITIN, TIBC, IRON, RETICCTPCT in the last 72 hours. Urine analysis:    Component Value Date/Time   COLORURINE YELLOW (A) 05/18/2019 0010   APPEARANCEUR CLOUDY (A) 05/18/2019 0010   APPEARANCEUR Cloudy 05/20/2012 0116   LABSPEC 1.016 05/18/2019 0010   LABSPEC 1.025 05/20/2012 0116   PHURINE 6.0 05/18/2019 0010   GLUCOSEU >=500 (A) 05/18/2019 0010   GLUCOSEU >=500 05/20/2012 0116   HGBUR SMALL (A) 05/18/2019 0010   BILIRUBINUR NEGATIVE 05/18/2019 0010   BILIRUBINUR Negative 05/20/2012 0116   KETONESUR NEGATIVE 05/18/2019 0010   PROTEINUR NEGATIVE 05/18/2019 0010   NITRITE NEGATIVE 05/18/2019 0010   LEUKOCYTESUR LARGE (A) 05/18/2019 0010   LEUKOCYTESUR 2+ 05/20/2012 0116   Sepsis Labs: Invalid input(s): PROCALCITONIN, [place]  Microbiology: Recent Results (from the past 240 hour(s))  SARS CORONAVIRUS 2 (TAT 6-24 HRS) Nasopharyngeal Nasopharyngeal Swab     Status: None   Collection Time: 12/15/19  9:57 PM   Specimen: Nasopharyngeal Swab  Result Value Ref Range Status   SARS Coronavirus 2 NEGATIVE NEGATIVE Final    Comment: (NOTE) SARS-CoV-2 target nucleic acids are NOT DETECTED. The SARS-CoV-2 RNA is generally detectable in upper and lower respiratory specimens during the acute phase of infection. Negative results do not preclude SARS-CoV-2 infection, do not rule out co-infections with other pathogens, and should not be used as the sole basis for treatment or other patient management decisions. Negative results must be combined with clinical observations, patient history, and epidemiological information. The expected result is Negative. Fact Sheet for Patients: SugarRoll.be Fact Sheet for Healthcare Providers: https://www.woods-mathews.com/ This test is not yet approved or cleared  by the [place] FDA and  has been authorized for detection and/or diagnosis of SARS-CoV-2 by FDA under an Emergency Use Authorization (EUA). This EUA will remain  in effect (meaning this test can be used) for the duration of the COVID-19 declaration under Section 56 4(b)(1) of the Act, 21 U.S.C. section 360bbb-3(b)(1), unless the authorization is terminated or revoked sooner. Performed at [facility], [place] [address] [place], [place] [address]   Aerobic/Anaerobic Culture (surgical/deep wound)     Status: None   Collection Time: 12/16/19  3:35 PM   Specimen: Wound  Result Value Ref Range Status   Specimen Description   Final    WOUND Performed at [facility], [address] [place], [place] [address]    Special Requests   Final    NONE Performed at [facility], [address] [place], [place] [address]    Gram Stain   Final    NO WBC SEEN ABUNDANT GRAM POSITIVE COCCI FEW GRAM NEGATIVE RODS    Culture   Final    MODERATE STREPTOCOCCUS ANGINOSIS FEW BACTEROIDES THETAIOTAOMICRON BETA LACTAMASE POSITIVE Performed at [facility], [place] [address] [place], [place] [address]    Report Status 12/19/2019 FINAL  Final   Organism ID, Bacteria STREPTOCOCCUS ANGINOSIS  Final      Susceptibility   Streptococcus anginosis - MIC*    PENICILLIN 0.12 SENSITIVE Sensitive  CEFTRIAXONE 0.5 SENSITIVE Sensitive     ERYTHROMYCIN >=8 RESISTANT Resistant     LEVOFLOXACIN 0.5 SENSITIVE Sensitive     VANCOMYCIN 0.5 SENSITIVE Sensitive     * MODERATE STREPTOCOCCUS ANGINOSIS  Surgical pcr screen     Status: Abnormal   Collection Time: 12/17/19  8:53 AM   Specimen: Nasal Mucosa; Nasal Swab  Result Value Ref Range Status   MRSA, PCR NEGATIVE NEGATIVE Final   Staphylococcus aureus POSITIVE (A) NEGATIVE Final    Comment: (NOTE) The Xpert SA Assay (FDA approved for NASAL specimens in patients 40 years of age and older), is one component of a  comprehensive surveillance program. It is not intended to diagnose infection nor to guide or monitor treatment. Performed at [facility], [address] [place], [place] [address]   Aerobic/Anaerobic Culture (surgical/deep wound)     Status: None (Preliminary result)   Collection Time: 12/19/19  2:26 PM   Specimen: Wound  Result Value Ref Range Status   Specimen Description   Final    WOUND Performed at [facility], [address] [place], [place] [address]    Special Requests   Final    NONE Performed at [facility], [address] [place], [place] [address]    Gram Stain   Final    RARE WBC PRESENT, PREDOMINANTLY PMN NO ORGANISMS SEEN    Culture   Final    NO GROWTH 3 DAYS NO ANAEROBES ISOLATED; CULTURE IN PROGRESS FOR 5 DAYS Performed at [facility], [place] [address], [place], [place] [address]    Report Status PENDING  Incomplete  SARS CORONAVIRUS 2 (TAT 6-24 HRS) Nasopharyngeal Nasopharyngeal Swab     Status: None   Collection Time: 12/22/19 11:29 AM   Specimen: Nasopharyngeal Swab  Result Value Ref Range Status   SARS Coronavirus 2 NEGATIVE NEGATIVE Final    Comment: (NOTE) SARS-CoV-2 target nucleic acids are NOT DETECTED. The SARS-CoV-2 RNA is generally detectable in upper and lower respiratory specimens during the acute phase of infection. Negative results do not preclude SARS-CoV-2 infection, do not rule out co-infections with other pathogens, and should not be used as the sole basis for treatment or other patient management decisions. Negative results must be combined with clinical observations, patient history, and epidemiological information. The expected result is Negative. Fact Sheet for Patients: SugarRoll.be Fact Sheet for Healthcare Providers: https://www.woods-mathews.com/ This test is not yet approved or cleared by the [place] FDA and  has been authorized for  detection and/or diagnosis of SARS-CoV-2 by FDA under an Emergency Use Authorization (EUA). This EUA will remain  in effect (meaning this test can be used) for the duration of the COVID-19 declaration under Section 56 4(b)(1) of the Act, 21 U.S.C. section 360bbb-3(b)(1), unless the authorization is terminated or revoked sooner. Performed at [facility], [place] [address], [place],  [address]     Radiology Studies: No results found.   Braelynn Lupton T. [place]  If 7PM-7AM, please contact night-coverage [email or web address] Password [facility] 12/23/2019, 11:38 AM

## 2019-12-23 NOTE — Progress Notes (Signed)
Physical Therapy Treatment Patient Details Name: Judith Shaw MRN: [id number] DOB: Mar 20, 1951 Today's Date: 12/23/2019    History of Present Illness Pt. is a 69 y.o. female who presents with a nonhealing ulceration to the lateral aspect of the fifth metatarsal phalangeal joint.  Patient was seen in clinic earlier in the week due to evidence of osteomyelitis on x-ray to the fifth metatarsal phalangeal joint and a wound that probes to bone at the lateral aspect of the joint.  Patient also has corresponding cellulitic changes to the left foot in which she has been treated with outpatient antibiotics with but it continues to recur. Partial 5th ray amputation 12/19/19 with NWB precautions    PT Comments    Pt was long sitting in bed upon arriving. She agrees to PT session and is cooperative throughout. She agrees to OOB to recliner. She did not know correct amount of wt bearing or need to wear post op shoe with transfers. Pt states she has been getting to/from [facility] but by putting wt on BLEs. Therapist educated pt on safety concerns and MD orders for proper wt bearing/use of post op shoe. Pt was able to exit bed with min assist, stand to RW with min assist, and hop to recliner ~ 5 ft with RW. She was very fatigued from minimal activity. Pt is progressing well but will benefit from continued skilled PT at DC to address strength and endurance deficits. Follow surgeons recommendations. Acute PT will continue to follow per POC.    Follow Up Recommendations  Supervision/Assistance - 24 hour;Follow surgeon's recommendation for DC plan and follow-up therapies     Equipment Recommendations  Rolling walker with 5" wheels    Recommendations for Other Services       Precautions / Restrictions Precautions Precautions: Fall Required Braces or Orthoses: Other Brace(Darco post-op shoe) Other Brace: post-op shoe Restrictions Weight Bearing Restrictions: Yes LLE Weight Bearing: Non weight bearing     Mobility  Bed Mobility Overal bed mobility: Needs Assistance Bed Mobility: Supine to Sit     Supine to sit: Min assist;HOB elevated     General bed mobility comments: Pt required min assist to exit R side of bed with vcs for tehcnique and sequencing.  Transfers Overall transfer level: Needs assistance Equipment used: Rolling walker (2 wheeled) Transfers: Sit to/from Stand Sit to Stand: Min assist         General transfer comment: Pt performed STS 3 x throughout session from elevated bed height. gait belt for safety. therapist demonstarted prior to pt performing. Cued for handplacement and technique to maintain NWB/minimize wt bearing during transfers  Ambulation/Gait Ambulation/Gait assistance: Min assist Gait Distance (Feet): 5 Feet Assistive device: Rolling walker (2 wheeled) Gait Pattern/deviations: (" hop") Gait velocity: decreased   General Gait Details: pt was able to hop ~ 5 ft from EOB to recliner. She fatigues extremely quickly.    Stairs             Wheelchair Mobility    Modified Rankin (Stroke Patients Only)       Balance                                            Cognition   Behavior During Therapy: WFL for tasks assessed/performed Overall Cognitive Status: Within Functional Limits for tasks assessed  General Comments: A & O X4      Exercises      General Comments        Pertinent Vitals/Pain Pain Assessment: No/denies pain Pain Score: 0-No pain Faces Pain Scale: No hurt Pain Intervention(s): Monitored during session    Home Living                      Prior Function            PT Goals (current goals can now be found in the care plan section) Acute Rehab PT Goals Patient Stated Goal: go home Progress towards PT goals: Progressing toward goals    Frequency    Min 2X/week      PT Plan Current plan remains appropriate    Co-evaluation               AM-PAC PT "6 Clicks" Mobility   Outcome Measure  Help needed turning from your back to your side while in a flat bed without using bedrails?: A Little Help needed moving from lying on your back to sitting on the side of a flat bed without using bedrails?: A Little Help needed moving to and from a bed to a chair (including a wheelchair)?: A Little Help needed standing up from a chair using your arms (e.g., wheelchair or bedside chair)?: A Lot Help needed to walk in hospital room?: A Lot Help needed climbing 3-5 steps with a railing? : A Lot 6 Click Score: 15    End of Session Equipment Utilized During Treatment: Gait belt Activity Tolerance: Patient limited by fatigue Patient left: in chair;with call bell/phone within reach;with chair alarm set Nurse Communication: Mobility status PT Visit Diagnosis: Unsteadiness on feet (R26.81);Other abnormalities of gait and mobility (R26.89)     Time: [id number] PT Time Calculation (min) (ACUTE ONLY): 14 min  Charges:  $Therapeutic Activity: 8-22 mins                     Julaine Fusi PTA 12/23/19, 5:19 PM

## 2019-12-23 NOTE — TOC Progression Note (Signed)
Transition of Care [facility]) - Progression Note    Patient Details  Name: Judith Shaw MRN: [id number] Date of Birth: 1951/01/13  Transition of Care [facility]) CM/SW [place], RN Phone Number: 12/23/2019, 10:38 AM  Clinical Narrative:    Damaris Schooner to Magda Paganini at [facility], she stated they are not going to make a bed offer, I called the patient's sister Abe People and let her know and she is going to reach out to Caney at [facility]. I offered the other beds that have made a bed offer, she stated no that the patient is going to go to [facility].   Awaiting a call back       Expected Discharge Plan and Services                                                 Social Determinants of Health (SDOH) Interventions    Readmission Risk Interventions No flowsheet data found.

## 2019-12-24 ENCOUNTER — Encounter: Payer: Self-pay | Admitting: Family Medicine

## 2019-12-24 DIAGNOSIS — M86172 Other acute osteomyelitis, left ankle and foot: Secondary | ICD-10-CM | POA: Diagnosis not present

## 2019-12-24 LAB — RENAL FUNCTION PANEL
Albumin: 2.7 g/dL — ABNORMAL LOW (ref 3.5–5.0)
Anion gap: 7 (ref 5–15)
BUN: 8 mg/dL (ref 8–23)
CO2: 25 mmol/L (ref 22–32)
Calcium: 8.8 mg/dL — ABNORMAL LOW (ref 8.9–10.3)
Chloride: 108 mmol/L (ref 98–111)
Creatinine, Ser: 0.68 mg/dL (ref 0.44–1.00)
GFR calc Af Amer: >60 mL/min (ref >60)
GFR calc non Af Amer: >60 mL/min (ref >60)
Glucose, Bld: 111 mg/dL — ABNORMAL HIGH (ref 70–99)
Phosphorus: 3 mg/dL (ref 2.5–4.6)
Potassium: 3.6 mmol/L (ref 3.5–5.1)
Sodium: 140 mmol/L (ref 135–145)

## 2019-12-24 LAB — GLUCOSE, CAPILLARY
Glucose-Capillary: 68 mg/dL — ABNORMAL LOW (ref 70–99)
Glucose-Capillary: 96 mg/dL (ref 70–99)
Glucose-Capillary: 145 mg/dL — ABNORMAL HIGH (ref 70–99)
Glucose-Capillary: 229 mg/dL — ABNORMAL HIGH (ref 70–99)

## 2019-12-24 LAB — MAGNESIUM: Magnesium: 1.6 mg/dL — ABNORMAL LOW (ref 1.7–2.4)

## 2019-12-24 LAB — CBC
HCT: 37.7 % (ref 36.0–46.0)
Hemoglobin: 13.1 g/dL (ref 12.0–15.0)
MCH: 30.5 pg (ref 26.0–34.0)
MCHC: 34.7 g/dL (ref 30.0–36.0)
MCV: 87.7 fL (ref 80.0–100.0)
Platelets: 271 10*3/uL (ref 150–400)
RBC: 4.3 MIL/uL (ref 3.87–5.11)
RDW: 13.2 % (ref 11.5–15.5)
WBC: 7.3 10*3/uL (ref 4.0–10.5)
nRBC: 0 % (ref 0.0–0.2)

## 2019-12-24 MED ORDER — MAGNESIUM OXIDE 400 (241.3 MG) MG PO TABS
400.0000 mg | ORAL_TABLET | Freq: Two times a day (BID) | ORAL | Status: DC
  Administered 2019-12-24: 09:00:00 400 mg via ORAL
  Filled 2019-12-24: qty 1

## 2019-12-24 MED ORDER — MAGNESIUM OXIDE 400 (241.3 MG) MG PO TABS: 400.0000 mg | ORAL_TABLET | Freq: Two times a day (BID) | ORAL | 0 refills | Status: AC

## 2019-12-24 MED ORDER — AMOXICILLIN-POT CLAVULANATE 875-125 MG PO TABS: 1.0000 | ORAL_TABLET | Freq: Two times a day (BID) | ORAL | 0 refills | Status: DC

## 2019-12-24 MED ORDER — ENOXAPARIN SODIUM 40 MG/0.4ML ~~LOC~~ SOLN: 40.0000 mg | SUBCUTANEOUS | 0 refills | Status: DC

## 2019-12-24 MED ORDER — INSULIN GLARGINE 100 UNIT/ML ~~LOC~~ SOLN
50.0000 [IU] | Freq: Two times a day (BID) | SUBCUTANEOUS | Status: DC
  Administered 2019-12-24: 12:00:00 50 [IU] via SUBCUTANEOUS
  Filled 2019-12-24 (×3): qty 0.5

## 2019-12-24 NOTE — Progress Notes (Signed)
Gave report to [place] [facility] [employer].

## 2019-12-24 NOTE — Progress Notes (Signed)
OT Cancellation Note  Patient Details Name: SINCLAIR COMPTON MRN: [id number] DOB: Jun 15, 1951   Cancelled Treatment:    Reason Eval/Treat Not Completed: Patient declined, no reason specified   Pt just awakening and starting to eat breakfast at this time when OT presents. OT offers to assist pt with transfer to the chair to make eating breakfast easier. Pt states she does not want to get up right now, but will later. Will f/u for OT tx at later date/time as able/pt agreeable.   Gerrianne Scale, [place], OTR/L ascom [phone] 12/24/19, 10:35 AM

## 2019-12-24 NOTE — TOC Progression Note (Signed)
Transition of Care [facility]) - Progression Note    Patient Details  Name: Judith Shaw MRN: [id number] Date of Birth: July 07, 1951  Transition of Care [facility]) CM/SW [place], RN Phone Number: 12/24/2019, 12:34 PM  Clinical Narrative:     RNCM called EMS for transport, the bedside nurse is aware, there are 6 ahead of them  Expected Discharge Plan: [place] Barriers to Discharge: Continued Medical Work up  Expected Discharge Plan and Services Expected Discharge Plan: [place]         Expected Discharge Date: 12/24/19                                     Social Determinants of Health (SDOH) Interventions    Readmission Risk Interventions No flowsheet data found.

## 2019-12-24 NOTE — Discharge Summary (Addendum)
Physician Discharge Summary  Judith Shaw [id number] DOB: [birth year] DOA: 12/15/2019  PCP: Kirk Ruths, MD  Admit date: 12/15/2019 Discharge date: 12/24/2019  Discharge disposition: Skilled nursing facility   Recommendations for Outpatient Follow-Up:   Follow-up with Dr. Caroline More, podiatrist, in 1 week   Discharge Diagnosis:   Active Problems:   HTN (hypertension)   Diabetes ([place])   Hyperlipidemia   Chronic diastolic CHF (congestive heart failure) ([place])   Morbid obesity ([place])   Sleep apnea   Acute osteomyelitis of left foot ([place])   Dementia without behavioral disturbance ([place])    Discharge Condition: Stable.  Diet recommendation: Heart healthy and diabetic diet  Code status: Full code.    Hospital Course:   Judith Shaw is a 70 year old female with history of HTN, uncontrolled DM-2, diastolic CHF, OSA and depression, who presented with left diabetic foot wound.  She was found to have cellulitis and osteomyelitis of the left foot.  She was treated with IV antibiotics (vancomycin and Zosyn).  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  Tissue cultures on 4/20 with Streptococcus anginosus and few bacteriodes.  Surgical tissue culture on 4/23 NGTD.  Antibiotics were deescalated to Augmentin. She was evaluated by PT and OT because of debility.  Her condition has improved and she is deemed stable for discharge.    Medical Consultants:    Podiatrist, Dr. Caroline More  Vascular surgeon Dr. [name]    Discharge Exam:   Vitals:   12/23/19 2257 12/24/19 0757  BP: (!) 158/72 (!) 122/55  Pulse: 67 61  Resp: 17 16  Temp: 98.5 F (36.9 C) 98.2 F (36.8 C)  SpO2: 96% 90%   Vitals:   12/23/19 0802 12/23/19 1551 12/23/19 2257 12/24/19 0757  BP: (!) 176/84 (!) 157/63 (!) 158/72 (!) 122/55  Pulse: 79 69 67 61  Resp: 17 16 17 16   Temp: 98.2 F (36.8 C) 98.7 F (37.1 C) 98.5 F (36.9 C) 98.2 F (36.8 C)  TempSrc: Oral  Oral Oral   SpO2: 95% 93% 96% 90%  Weight:      Height:         GEN: NAD SKIN: No rash EYES: EOMI ENT: MMM CV: RRR PULM: CTA B ABD: soft, obese, NT, +BS CNS: AAO x 3, non focal EXT: No edema or tenderness.  Dressing on left foot is clean, dry and intact   The results of significant diagnostics from this hospitalization (including imaging, microbiology, ancillary and laboratory) are listed below for reference.     Procedures and Diagnostic Studies:   DG Foot Complete Left  Result Date: 12/15/2019 CLINICAL DATA:  Left foot ulcer.  Evaluate for osteomyelitis. EXAM: LEFT FOOT - COMPLETE 3+ VIEW COMPARISON:  None. FINDINGS: Soft tissue ulcer at the lateral aspect of the fifth MTP joint. Early bony destruction of the fifth metatarsal head and fifth proximal phalanx base. No acute fracture or dislocation. Joint spaces are preserved. Dorsal midfoot degenerative spurring. Plantar enthesophyte. Bone mineralization is normal. IMPRESSION: 1. Soft tissue ulcer at the lateral aspect of the fifth MTP joint with underlying early osteomyelitis of the fifth metatarsal head and proximal phalanx. Electronically Signed   By: Titus Dubin M.D.   On: 12/15/2019 18:21     Labs:   Basic Metabolic Panel: Recent Labs  Lab 12/18/19 0505 12/18/19 0505 12/19/19 [id number] 12/20/19 [id number] 12/21/19 0003 12/21/19 0003 12/21/19 0425 12/22/19 0714 12/24/19 0439  NA 137  --   --   --  135  --   --  140 140  K 4.0   < >  --   --  4.9   < >  --  3.8 3.6  CL 106  --   --   --  107  --   --  113* 108  CO2 25  --   --   --  24  --   --  23 25  GLUCOSE 217*  --   --   --  216*  --   --  68* 111*  BUN 14  --   --   --  14  --   --  11 8  CREATININE 0.92  --    < > 1.03* 1.16*  --  1.12* 0.85 0.68  CALCIUM 8.0*  --   --   --  8.2*  --   --  8.2* 8.8*  MG  --   --   --   --  1.9  --   --  1.9 1.6*  PHOS  --   --   --   --   --   --   --  2.1* 3.0   < > = values in this interval not displayed.   GFR Estimated  Creatinine Clearance: 87.2 mL/min (by C-G formula based on SCr of 0.68 mg/dL). Liver Function Tests: Recent Labs  Lab 12/22/19 0714 12/24/19 0439  ALBUMIN 2.7* 2.7*   No results for input(s): LIPASE, AMYLASE in the last 168 hours. No results for input(s): AMMONIA in the last 168 hours. Coagulation profile No results for input(s): INR, PROTIME in the last 168 hours.  CBC: Recent Labs  Lab 12/18/19 0505 12/21/19 0003 12/24/19 0439  WBC 7.1 9.2 7.3  HGB 13.8 12.8 13.1  HCT 41.3 39.0 37.7  MCV 91.0 92.2 87.7  PLT 295 266 271   Cardiac Enzymes: No results for input(s): CKTOTAL, CKMB, CKMBINDEX, TROPONINI in the last 168 hours. BNP: Invalid input(s): POCBNP CBG: Recent Labs  Lab 12/23/19 1644 12/23/19 2123 12/24/19 0753 12/24/19 0952 12/24/19 1136  GLUCAP 181* 175* 68* 96 145*   D-Dimer No results for input(s): DDIMER in the last 72 hours. Hgb A1c No results for input(s): HGBA1C in the last 72 hours. Lipid Profile No results for input(s): CHOL, HDL, LDLCALC, TRIG, CHOLHDL, LDLDIRECT in the last 72 hours. Thyroid function studies No results for input(s): TSH, T4TOTAL, T3FREE, THYROIDAB in the last 72 hours.  Invalid input(s): FREET3 Anemia work up No results for input(s): VITAMINB12, FOLATE, FERRITIN, TIBC, IRON, RETICCTPCT in the last 72 hours. Microbiology Recent Results (from the past 240 hour(s))  SARS CORONAVIRUS 2 (TAT 6-24 HRS) Nasopharyngeal Nasopharyngeal Swab     Status: None   Collection Time: 12/15/19  9:57 PM   Specimen: Nasopharyngeal Swab  Result Value Ref Range Status   SARS Coronavirus 2 NEGATIVE NEGATIVE Final    Comment: (NOTE) SARS-CoV-2 target nucleic acids are NOT DETECTED. The SARS-CoV-2 RNA is generally detectable in upper and lower respiratory specimens during the acute phase of infection. Negative results do not preclude SARS-CoV-2 infection, do not rule out co-infections with other pathogens, and should not be used as the sole basis  for treatment or other patient management decisions. Negative results must be combined with clinical observations, patient history, and epidemiological information. The expected result is Negative. Fact Sheet for Patients: SugarRoll.be Fact Sheet for Healthcare Providers: https://www.woods-mathews.com/ This test is not yet approved or cleared by the [place] FDA and  has been authorized for detection and/or diagnosis of SARS-CoV-2 by FDA under an Emergency Use Authorization (EUA). This EUA will remain  in effect (meaning this test can be used) for the duration of the COVID-19 declaration under Section 56 4(b)(1) of the Act, 21 U.S.C. section 360bbb-3(b)(1), unless the authorization is terminated or revoked sooner. Performed at [facility], [place] [address], [place], [place] [address]   Aerobic/Anaerobic Culture (surgical/deep wound)     Status: None   Collection Time: 12/16/19  3:35 PM   Specimen: Wound  Result Value Ref Range Status   Specimen Description   Final    WOUND Performed at [facility], [address], [place], [place] [address]    Special Requests   Final    NONE Performed at [facility], [address] [place], [place] [address]    Gram Stain   Final    NO WBC SEEN ABUNDANT GRAM POSITIVE COCCI FEW GRAM NEGATIVE RODS    Culture   Final    MODERATE STREPTOCOCCUS ANGINOSIS FEW BACTEROIDES THETAIOTAOMICRON BETA LACTAMASE POSITIVE Performed at [facility], [place] [address], [place], [place] [address]    Report Status 12/19/2019 FINAL  Final   Organism ID, Bacteria STREPTOCOCCUS ANGINOSIS  Final      Susceptibility   Streptococcus anginosis - MIC*    PENICILLIN 0.12 SENSITIVE Sensitive     CEFTRIAXONE 0.5 SENSITIVE Sensitive     ERYTHROMYCIN >=8 RESISTANT Resistant     LEVOFLOXACIN 0.5 SENSITIVE Sensitive     VANCOMYCIN 0.5 SENSITIVE Sensitive     * MODERATE STREPTOCOCCUS ANGINOSIS   Surgical pcr screen     Status: Abnormal   Collection Time: 12/17/19  8:53 AM   Specimen: Nasal Mucosa; Nasal Swab  Result Value Ref Range Status   MRSA, PCR NEGATIVE NEGATIVE Final   Staphylococcus aureus POSITIVE (A) NEGATIVE Final    Comment: (NOTE) The Xpert SA Assay (FDA approved for NASAL specimens in patients 63 years of age and older), is one component of a comprehensive surveillance program. It is not intended to diagnose infection nor to guide or monitor treatment. Performed at [facility], [address] [place], [place] [address]   Aerobic/Anaerobic Culture (surgical/deep wound)     Status: None (Preliminary result)   Collection Time: 12/19/19  2:26 PM   Specimen: Wound  Result Value Ref Range Status   Specimen Description   Final    WOUND Performed at [facility], [address] [place], [place] [address]    Special Requests   Final    NONE Performed at [facility], [address] [place], [place] [address]    Gram Stain   Final    RARE WBC PRESENT, PREDOMINANTLY PMN NO ORGANISMS SEEN    Culture   Final    NO GROWTH 4 DAYS NO ANAEROBES ISOLATED; CULTURE IN PROGRESS FOR 5 DAYS Performed at [facility], [place] [address] [place], [place] [address]    Report Status PENDING  Incomplete  SARS CORONAVIRUS 2 (TAT 6-24 HRS) Nasopharyngeal Nasopharyngeal Swab     Status: None   Collection Time: 12/22/19 11:29 AM   Specimen: Nasopharyngeal Swab  Result Value Ref Range Status   SARS Coronavirus 2 NEGATIVE NEGATIVE Final    Comment: (NOTE) SARS-CoV-2 target nucleic acids are NOT DETECTED. The SARS-CoV-2 RNA is generally detectable in upper and lower respiratory specimens during the acute phase of infection. Negative results do not preclude SARS-CoV-2 infection,  do not rule out co-infections with other pathogens, and should not be used as the sole basis for treatment or other patient management decisions. Negative results must  be combined with clinical observations, patient history, and epidemiological information. The expected result is Negative. Fact Sheet for Patients: SugarRoll.be Fact Sheet for Healthcare Providers: https://www.woods-mathews.com/ This test is not yet approved or cleared by the [place] FDA and  has been authorized for detection and/or diagnosis of SARS-CoV-2 by FDA under an Emergency Use Authorization (EUA). This EUA will remain  in effect (meaning this test can be used) for the duration of the COVID-19 declaration under Section 56 4(b)(1) of the Act, 21 U.S.C. section 360bbb-3(b)(1), unless the authorization is terminated or revoked sooner. Performed at [facility], [place] [address] [place], [place] [address]      Discharge Instructions:   Discharge Instructions    Diet - low sodium heart healthy   Complete by: As directed    Diet Carb Modified   Complete by: As directed    Discharge instructions   Complete by: As directed    Follow up with physician at the nursing home within 3 days of discharge   Increase activity slowly   Complete by: As directed      Allergies as of 12/24/2019      Reactions   Codeine       Medication List    STOP taking these medications   carvedilol 3.125 MG tablet Commonly known as: COREG   insulin lispro 100 UNIT/ML KwikPen Commonly known as: HUMALOG   meloxicam 7.5 MG tablet Commonly known as: Mobic   Tresiba FlexTouch 200 UNIT/ML FlexTouch Pen Generic drug: insulin degludec     TAKE these medications   acetaminophen 500 MG tablet Commonly known as: TYLENOL Take 2 tablets (1,000 mg total) by mouth every 8 (eight) hours. What changed:   how much to take  when to take this  reasons to take this   amoxicillin-clavulanate 875-125 MG tablet Commonly known as: AUGMENTIN Take 1 tablet by mouth 2 (two) times daily.   aspirin EC 81 MG tablet Take 81 mg by mouth daily.    Baclofen 5 MG Tabs Take 5 mg by mouth at bedtime.   clotrimazole-betamethasone cream Commonly known as: LOTRISONE Apply 1 application topically 2 (two) times daily.   donepezil 5 MG tablet Commonly known as: ARICEPT Take 5 mg by mouth daily.   DULoxetine HCl 60 MG Csdr Take 60 mg by mouth daily.   enoxaparin 40 MG/0.4ML injection Commonly known as: LOVENOX Inject 0.4 mLs (40 mg total) into the skin daily for 17 days.   furosemide 20 MG tablet Commonly known as: LASIX Take 20 mg by mouth every other day.   gentian violet 2 % topical solution Apply 0.5 mLs topically in the morning and at bedtime. Apply between the toes twice daily.   insulin aspart 100 UNIT/ML injection Commonly known as: novoLOG Inject 0-15 Units into the skin 3 (three) times daily with meals. CBG 70 - 120: 0 units CBG 121 - 150: 2 units CBG 151 - 200: 3 units CBG 201 - 250: 5 units CBG 251 - 300: 8 units CBG 301 - 350: 11 units CBG 351 - 400: 15 units CBG > 400: call MD and obtain STAT lab verification   insulin aspart 100 UNIT/ML injection Commonly known as: novoLOG Inject 6 Units into the skin 3 (three) times daily with meals.   insulin glargine 100 UNIT/ML injection Commonly known  as: LANTUS Inject 0.6 mLs (60 Units total) into the skin 2 (two) times daily.   ketoconazole 2 % cream Commonly known as: NIZORAL Apply 1 application topically in the morning and at bedtime.   magnesium oxide 400 (241.3 Mg) MG tablet Commonly known as: MAG-OX Take 1 tablet (400 mg total) by mouth 2 (two) times daily for 5 days.   metFORMIN 500 MG 24 hr tablet Commonly known as: GLUCOPHAGE-XR Take 500 mg by mouth daily. With dinner.   mirabegron ER 25 MG Tb24 tablet Commonly known as: MYRBETRIQ Take 1 tablet (25 mg total) by mouth daily.   potassium chloride 10 MEQ tablet Commonly known as: KLOR-CON Take 10 mEq by mouth daily.   ramipril 5 MG capsule Commonly known as: ALTACE Take 5 mg by mouth daily.    saccharomyces boulardii 250 MG capsule Commonly known as: FLORASTOR Take 1 capsule (250 mg total) by mouth 2 (two) times daily.   Santyl ointment Generic drug: collagenase Apply 1 application topically daily.   simvastatin 40 MG tablet Commonly known as: ZOCOR Take 40 mg by mouth every morning.   terconazole 0.4 % vaginal cream Commonly known as: TERAZOL 7 Place 1 applicator vaginally at bedtime.   traZODone 50 MG tablet Commonly known as: DESYREL Take 0.5 tablets (25 mg total) by mouth at bedtime as needed for sleep.   triamcinolone lotion 0.1 % Commonly known as: KENALOG Apply 1 application topically 3 (three) times daily.       Contact information for follow-up providers    Caroline More, DPM. Schedule an appointment as soon as possible for a visit in 1 week(s).   Specialty: Podiatry Contact information: [place] [address] [phone]            Contact information for after-discharge care    Destination    [facility] Preferred SNF .   Service: Skilled Nursing Contact information: [address] [place] [phone]                   Time coordinating discharge: 33 minutes  Signed:  Jennye Boroughs  [facility] 12/24/2019, 12:23 PM

## 2019-12-24 NOTE — TOC Progression Note (Signed)
Transition of Care [facility]) - Progression Note    Patient Details  Name: Judith Shaw MRN: [id number] Date of Birth: [birth year]  Transition of Care [facility]) CM/SW [place], RN Phone Number: 12/24/2019, 12:30 PM  Clinical Narrative:    Patient to DC to [facility] today, [name] her sister is aware, The bedside nurse to call report to [facility], EMS to be called by [facility]    Expected Discharge Plan: Skilled Nursing Facility Barriers to Discharge: Continued Medical Work up  Expected Discharge Plan and Services Expected Discharge Plan: [place]         Expected Discharge Date: 12/24/19                                     Social Determinants of Health (SDOH) Interventions    Readmission Risk Interventions No flowsheet data found.

## 2019-12-25 LAB — AEROBIC/ANAEROBIC CULTURE W GRAM STAIN (SURGICAL/DEEP WOUND): Culture: NO GROWTH

## 2020-01-13 DIAGNOSIS — Z89422 Acquired absence of other left toe(s): Secondary | ICD-10-CM | POA: Insufficient documentation

## 2020-01-15 ENCOUNTER — Emergency Department: Payer: Medicare PPO

## 2020-01-15 ENCOUNTER — Other Ambulatory Visit: Payer: Self-pay

## 2020-01-15 ENCOUNTER — Encounter: Payer: Self-pay | Admitting: Emergency Medicine

## 2020-01-15 DIAGNOSIS — Z5321 Procedure and treatment not carried out due to patient leaving prior to being seen by health care provider: Secondary | ICD-10-CM | POA: Diagnosis not present

## 2020-01-15 DIAGNOSIS — F419 Anxiety disorder, unspecified: Secondary | ICD-10-CM | POA: Insufficient documentation

## 2020-01-15 DIAGNOSIS — R0789 Other chest pain: Secondary | ICD-10-CM | POA: Insufficient documentation

## 2020-01-15 LAB — CBC WITH DIFFERENTIAL/PLATELET
Abs Immature Granulocytes: 0.05 10*3/uL (ref 0.00–0.07)
Basophils Absolute: 0 10*3/uL (ref 0.0–0.1)
Basophils Relative: 0 %
Eosinophils Absolute: 0.3 10*3/uL (ref 0.0–0.5)
Eosinophils Relative: 3 %
HCT: 46 % (ref 36.0–46.0)
Hemoglobin: 15 g/dL (ref 12.0–15.0)
Immature Granulocytes: 1 %
Lymphocytes Relative: 29 %
Lymphs Abs: 2.6 10*3/uL (ref 0.7–4.0)
MCH: 29.3 pg (ref 26.0–34.0)
MCHC: 32.6 g/dL (ref 30.0–36.0)
MCV: 89.8 fL (ref 80.0–100.0)
Monocytes Absolute: 0.7 10*3/uL (ref 0.1–1.0)
Monocytes Relative: 8 %
Neutro Abs: 5.4 10*3/uL (ref 1.7–7.7)
Neutrophils Relative %: 59 %
Platelets: 328 10*3/uL (ref 150–400)
RBC: 5.12 MIL/uL — ABNORMAL HIGH (ref 3.87–5.11)
RDW: 13.2 % (ref 11.5–15.5)
WBC: 9 10*3/uL (ref 4.0–10.5)
nRBC: 0 % (ref 0.0–0.2)

## 2020-01-15 IMAGING — CR DG CHEST 2V
1 series · 2 of 2 positions shown · non-contrast
Comparison: [DATE]

CLINICAL DATA: Chest pain radiating into the upper back.

EXAM:
CHEST - 2 VIEW

[Series 1: dg chest 2 view · 0.14mm/px · 2 of 2 slices shown]
[im 1/2]
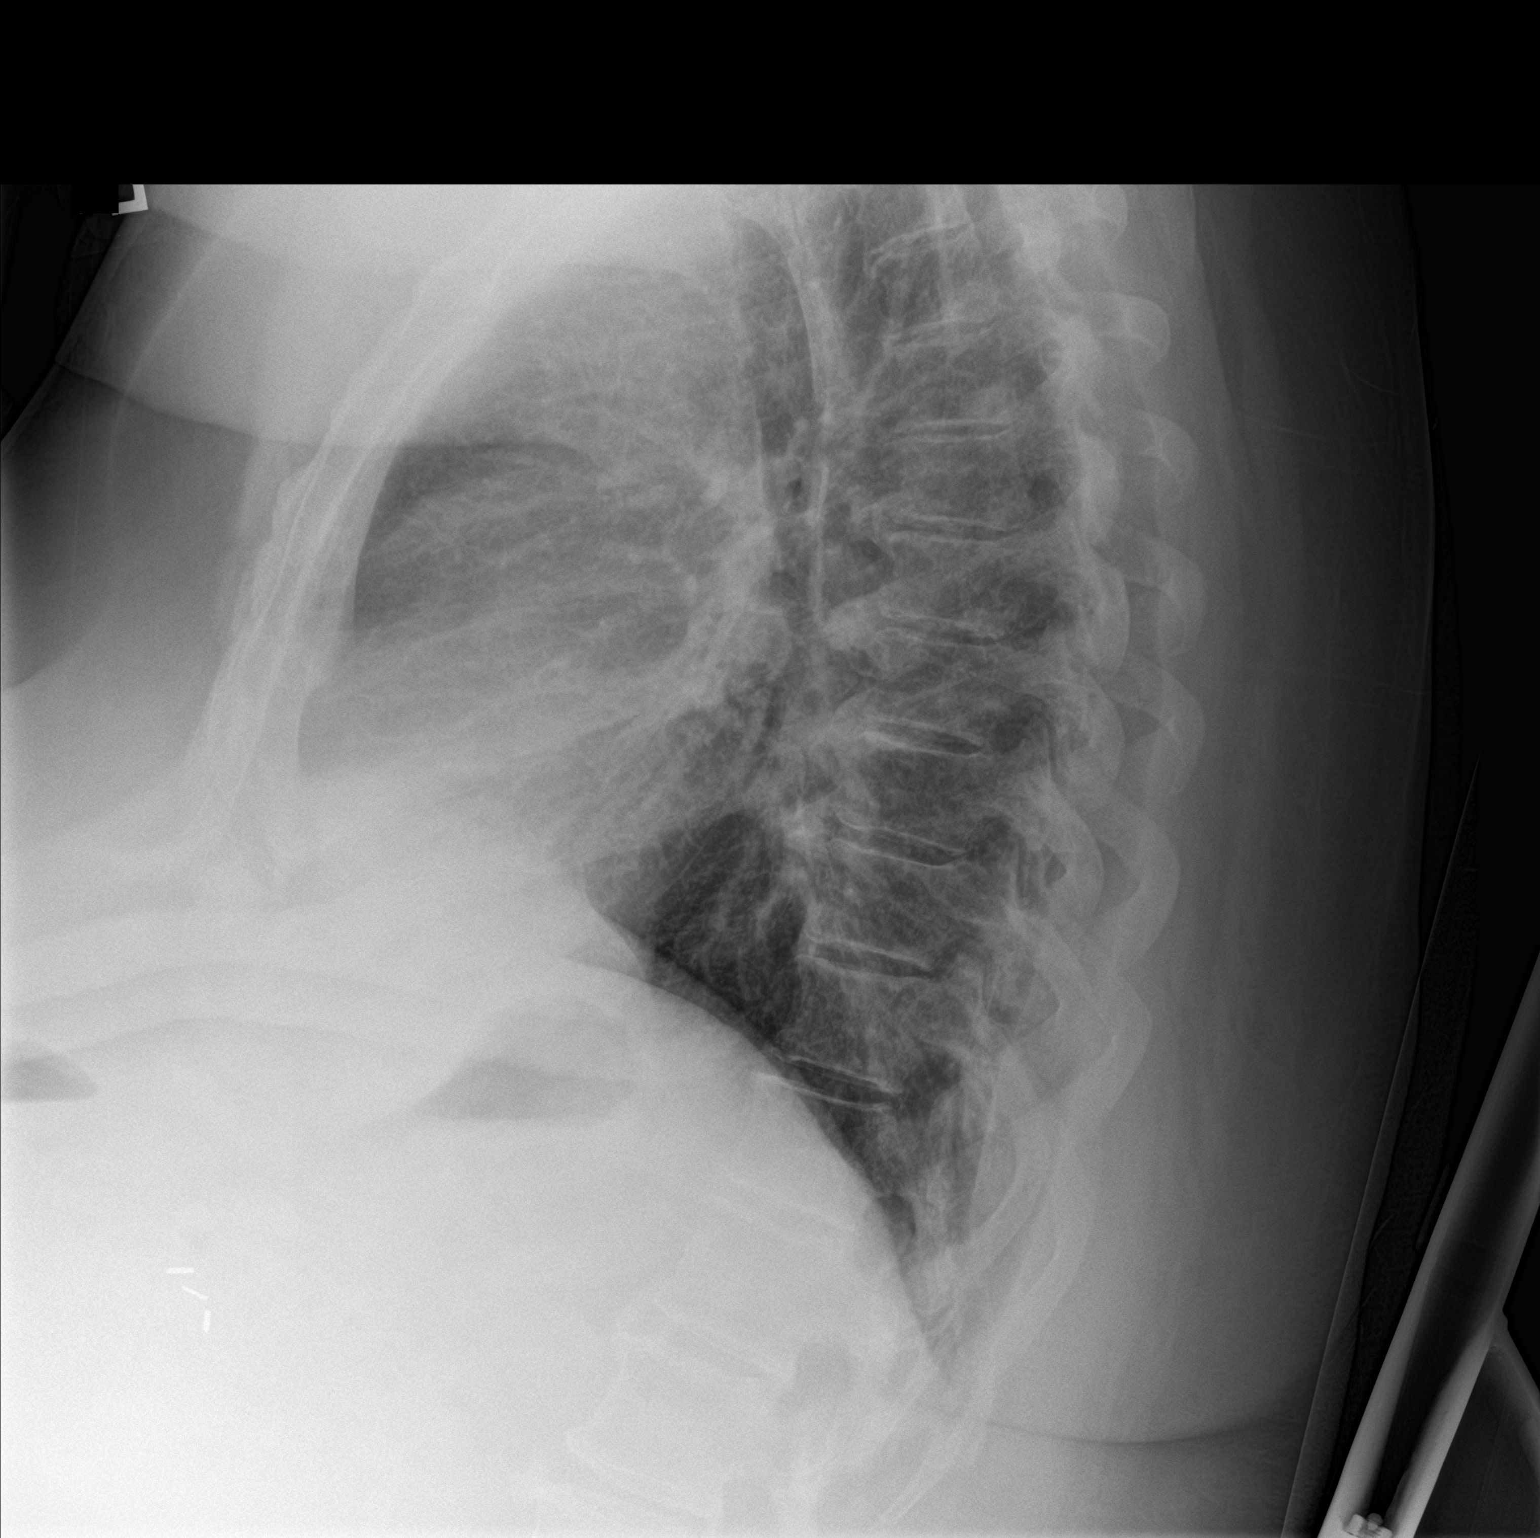
[im 2/2]
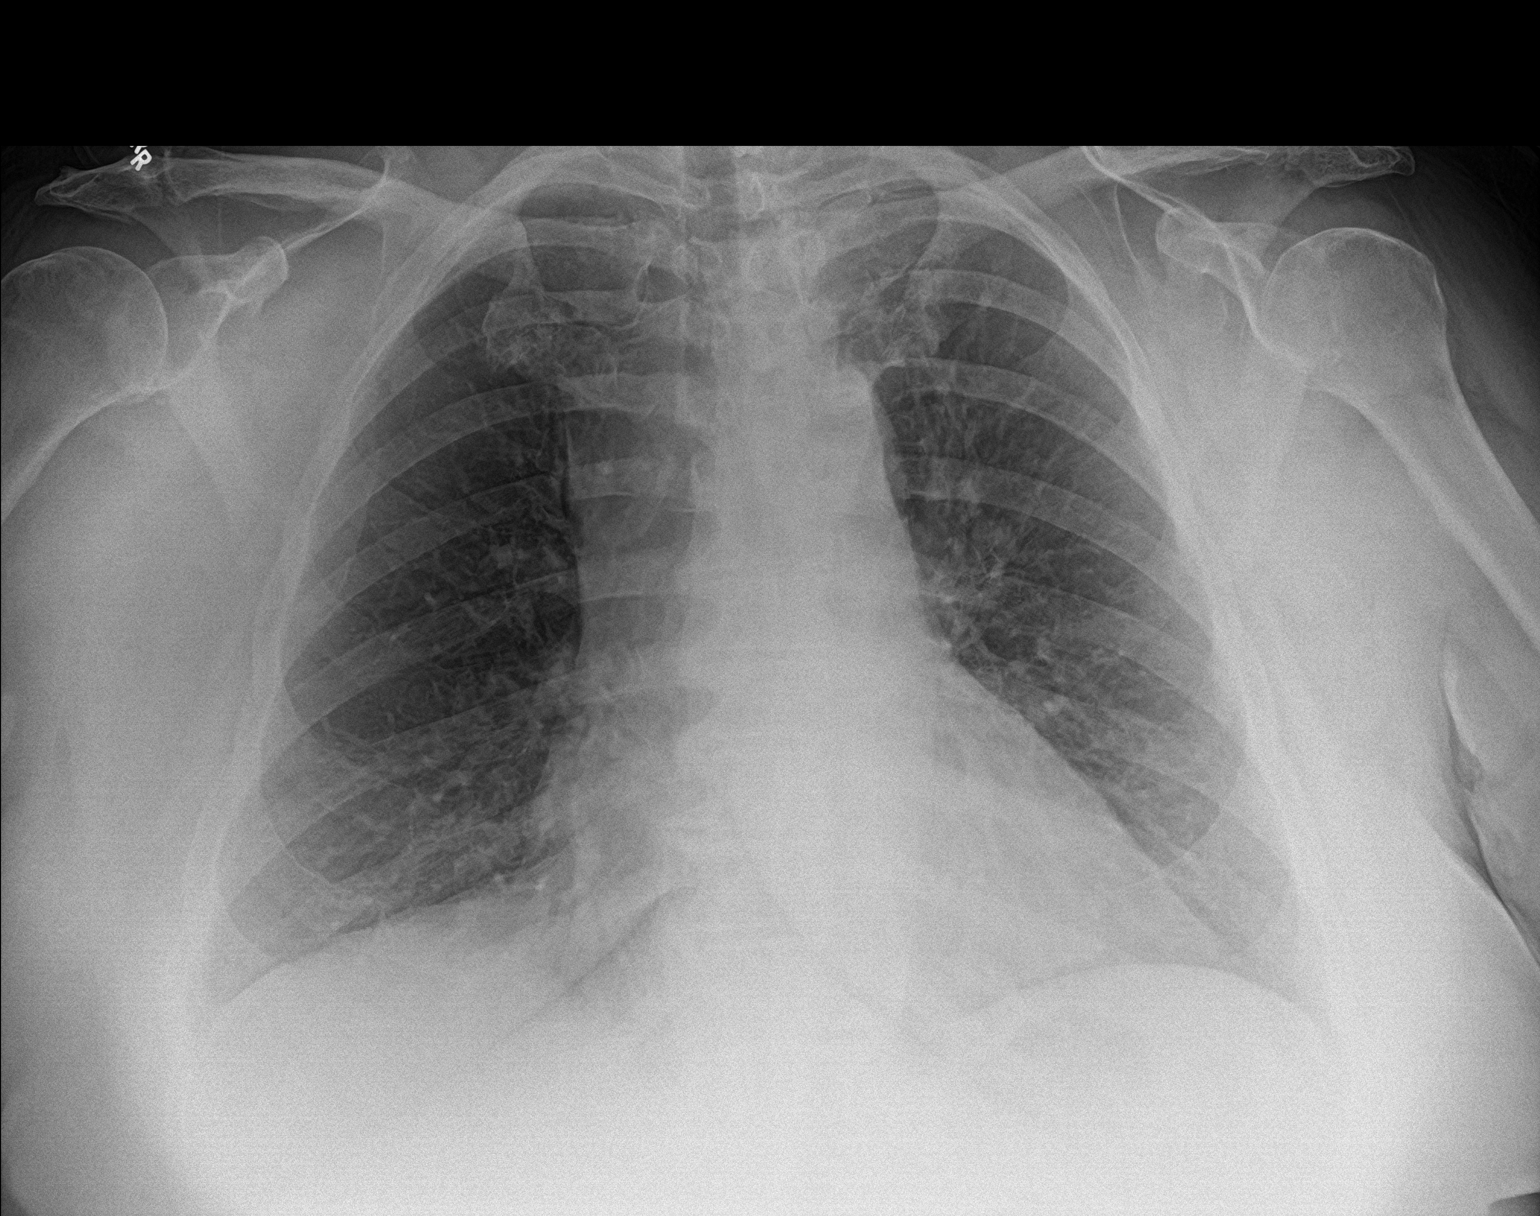

[2 of 2 positions shown; findings below may reference images not displayed]

FINDINGS: There is no evidence of acute infiltrate, pleural effusion or
pneumothorax. The heart size and mediastinal contours are within
normal limits. The visualized skeletal structures are unremarkable.
IMPRESSION: No active cardiopulmonary disease.

## 2020-01-15 NOTE — ED Triage Notes (Signed)
Pt to triage via w/c with no distress noted, mask in place; pt reports mid CP radiating into upper back accomp by "anxiety"

## 2020-01-16 ENCOUNTER — Telehealth: Payer: Self-pay | Admitting: Emergency Medicine

## 2020-01-16 ENCOUNTER — Emergency Department
Admission: EM | Admit: 2020-01-16 | Discharge: 2020-01-16 | Disposition: A | Discharge status: Home / Self Care | Payer: Medicare PPO | Attending: Emergency Medicine | Admitting: Emergency Medicine

## 2020-01-16 LAB — COMPREHENSIVE METABOLIC PANEL
ALT: 21 U/L (ref 0–44)
AST: 25 U/L (ref 15–41)
Albumin: 3.7 g/dL (ref 3.5–5.0)
Alkaline Phosphatase: 78 U/L (ref 38–126)
Anion gap: 10 (ref 5–15)
BUN: 30 mg/dL — ABNORMAL HIGH (ref 8–23)
CO2: 24 mmol/L (ref 22–32)
Calcium: 9.6 mg/dL (ref 8.9–10.3)
Chloride: 100 mmol/L (ref 98–111)
Creatinine, Ser: 1.44 mg/dL — ABNORMAL HIGH (ref 0.44–1.00)
GFR calc Af Amer: 43 mL/min — ABNORMAL LOW (ref >60)
GFR calc non Af Amer: 37 mL/min — ABNORMAL LOW (ref >60)
Glucose, Bld: 179 mg/dL — ABNORMAL HIGH (ref 70–99)
Potassium: 5.5 mmol/L — ABNORMAL HIGH (ref 3.5–5.1)
Sodium: 134 mmol/L — ABNORMAL LOW (ref 135–145)
Total Bilirubin: 0.6 mg/dL (ref 0.3–1.2)
Total Protein: 7.9 g/dL (ref 6.5–8.1)

## 2020-01-16 LAB — TROPONIN I (HIGH SENSITIVITY): Troponin I (High Sensitivity): 8 ng/L (ref <18)

## 2020-01-16 NOTE — Telephone Encounter (Signed)
Called patient due to lwot to inquire about condition and follow up plans. Left message.   

## 2020-01-18 ENCOUNTER — Encounter: Payer: Self-pay | Admitting: Emergency Medicine

## 2020-01-18 ENCOUNTER — Other Ambulatory Visit: Payer: Self-pay

## 2020-01-18 ENCOUNTER — Emergency Department: Payer: Medicare PPO

## 2020-01-18 ENCOUNTER — Emergency Department
Admission: EM | Admit: 2020-01-18 | Discharge: 2020-01-18 | Disposition: A | Discharge status: Home / Self Care | Payer: Medicare PPO | Attending: Emergency Medicine | Admitting: Emergency Medicine

## 2020-01-18 DIAGNOSIS — Z7982 Long term (current) use of aspirin: Secondary | ICD-10-CM | POA: Diagnosis not present

## 2020-01-18 DIAGNOSIS — F419 Anxiety disorder, unspecified: Secondary | ICD-10-CM | POA: Diagnosis not present

## 2020-01-18 DIAGNOSIS — Z79899 Other long term (current) drug therapy: Secondary | ICD-10-CM | POA: Diagnosis not present

## 2020-01-18 DIAGNOSIS — R413 Other amnesia: Secondary | ICD-10-CM

## 2020-01-18 DIAGNOSIS — Z794 Long term (current) use of insulin: Secondary | ICD-10-CM | POA: Diagnosis not present

## 2020-01-18 DIAGNOSIS — I110 Hypertensive heart disease with heart failure: Secondary | ICD-10-CM | POA: Diagnosis not present

## 2020-01-18 DIAGNOSIS — Z8542 Personal history of malignant neoplasm of other parts of uterus: Secondary | ICD-10-CM | POA: Insufficient documentation

## 2020-01-18 DIAGNOSIS — E119 Type 2 diabetes mellitus without complications: Secondary | ICD-10-CM | POA: Diagnosis not present

## 2020-01-18 DIAGNOSIS — I5032 Chronic diastolic (congestive) heart failure: Secondary | ICD-10-CM | POA: Insufficient documentation

## 2020-01-18 DIAGNOSIS — Z90710 Acquired absence of both cervix and uterus: Secondary | ICD-10-CM | POA: Diagnosis not present

## 2020-01-18 DIAGNOSIS — R079 Chest pain, unspecified: Secondary | ICD-10-CM | POA: Diagnosis present

## 2020-01-18 LAB — BASIC METABOLIC PANEL
Anion gap: 9 (ref 5–15)
BUN: 25 mg/dL — ABNORMAL HIGH (ref 8–23)
CO2: 25 mmol/L (ref 22–32)
Calcium: 9.6 mg/dL (ref 8.9–10.3)
Chloride: 102 mmol/L (ref 98–111)
Creatinine, Ser: 0.99 mg/dL (ref 0.44–1.00)
GFR calc Af Amer: >60 mL/min (ref >60)
GFR calc non Af Amer: 58 mL/min — ABNORMAL LOW (ref >60)
Glucose, Bld: 296 mg/dL — ABNORMAL HIGH (ref 70–99)
Potassium: 5.4 mmol/L — ABNORMAL HIGH (ref 3.5–5.1)
Sodium: 136 mmol/L (ref 135–145)

## 2020-01-18 LAB — CBC
HCT: 41.3 % (ref 36.0–46.0)
Hemoglobin: 14.3 g/dL (ref 12.0–15.0)
MCH: 30.2 pg (ref 26.0–34.0)
MCHC: 34.6 g/dL (ref 30.0–36.0)
MCV: 87.1 fL (ref 80.0–100.0)
Platelets: 309 10*3/uL (ref 150–400)
RBC: 4.74 MIL/uL (ref 3.87–5.11)
RDW: 13.2 % (ref 11.5–15.5)
WBC: 7.6 10*3/uL (ref 4.0–10.5)
nRBC: 0 % (ref 0.0–0.2)

## 2020-01-18 LAB — TROPONIN I (HIGH SENSITIVITY): Troponin I (High Sensitivity): 6 ng/L (ref <18)

## 2020-01-18 IMAGING — CR DG CHEST 2V
2 series · 2 of 2 positions shown · non-contrast
Comparison: [DATE]

CLINICAL DATA: Chest pain and shortness of breath.

EXAM:
CHEST - 2 VIEW

[chest lat]
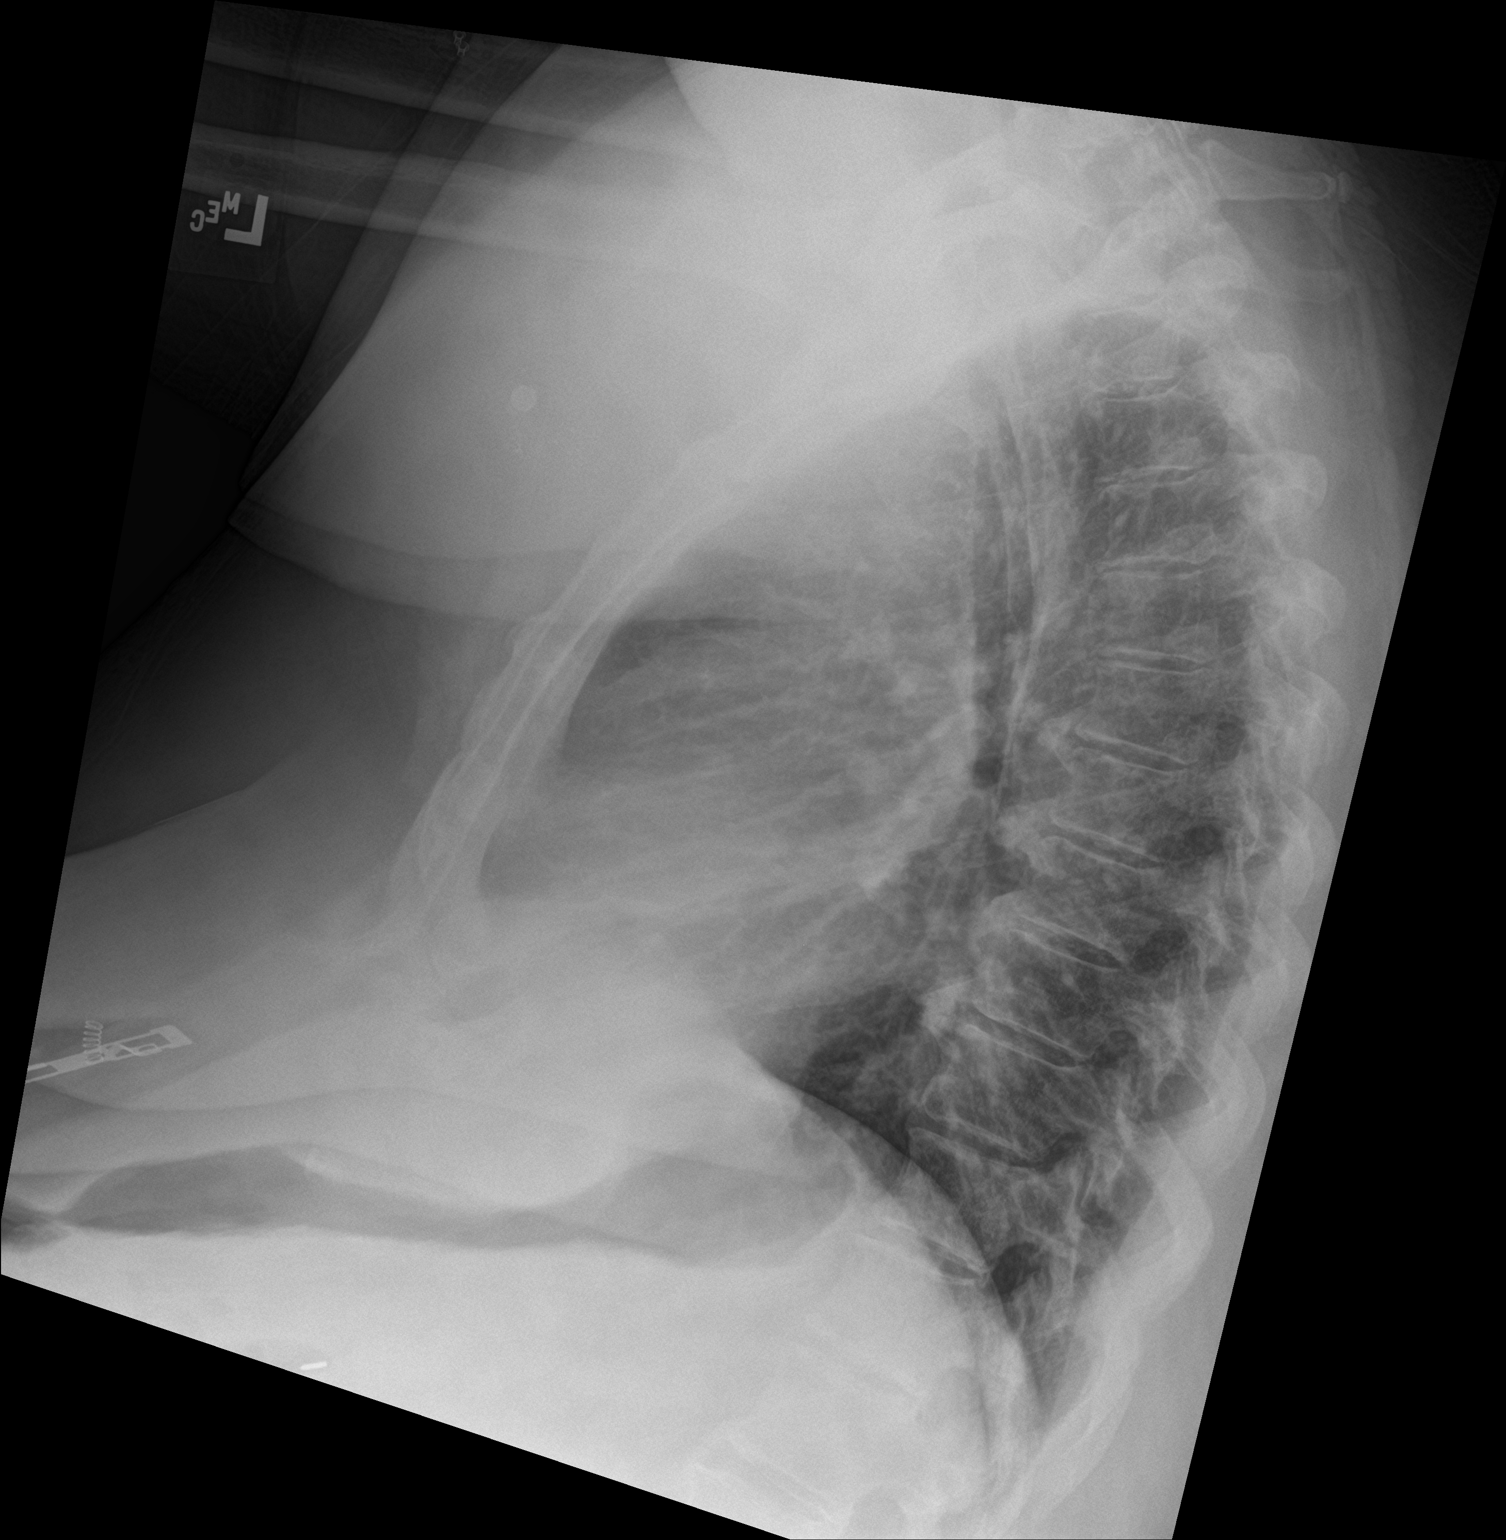

[chest ap]
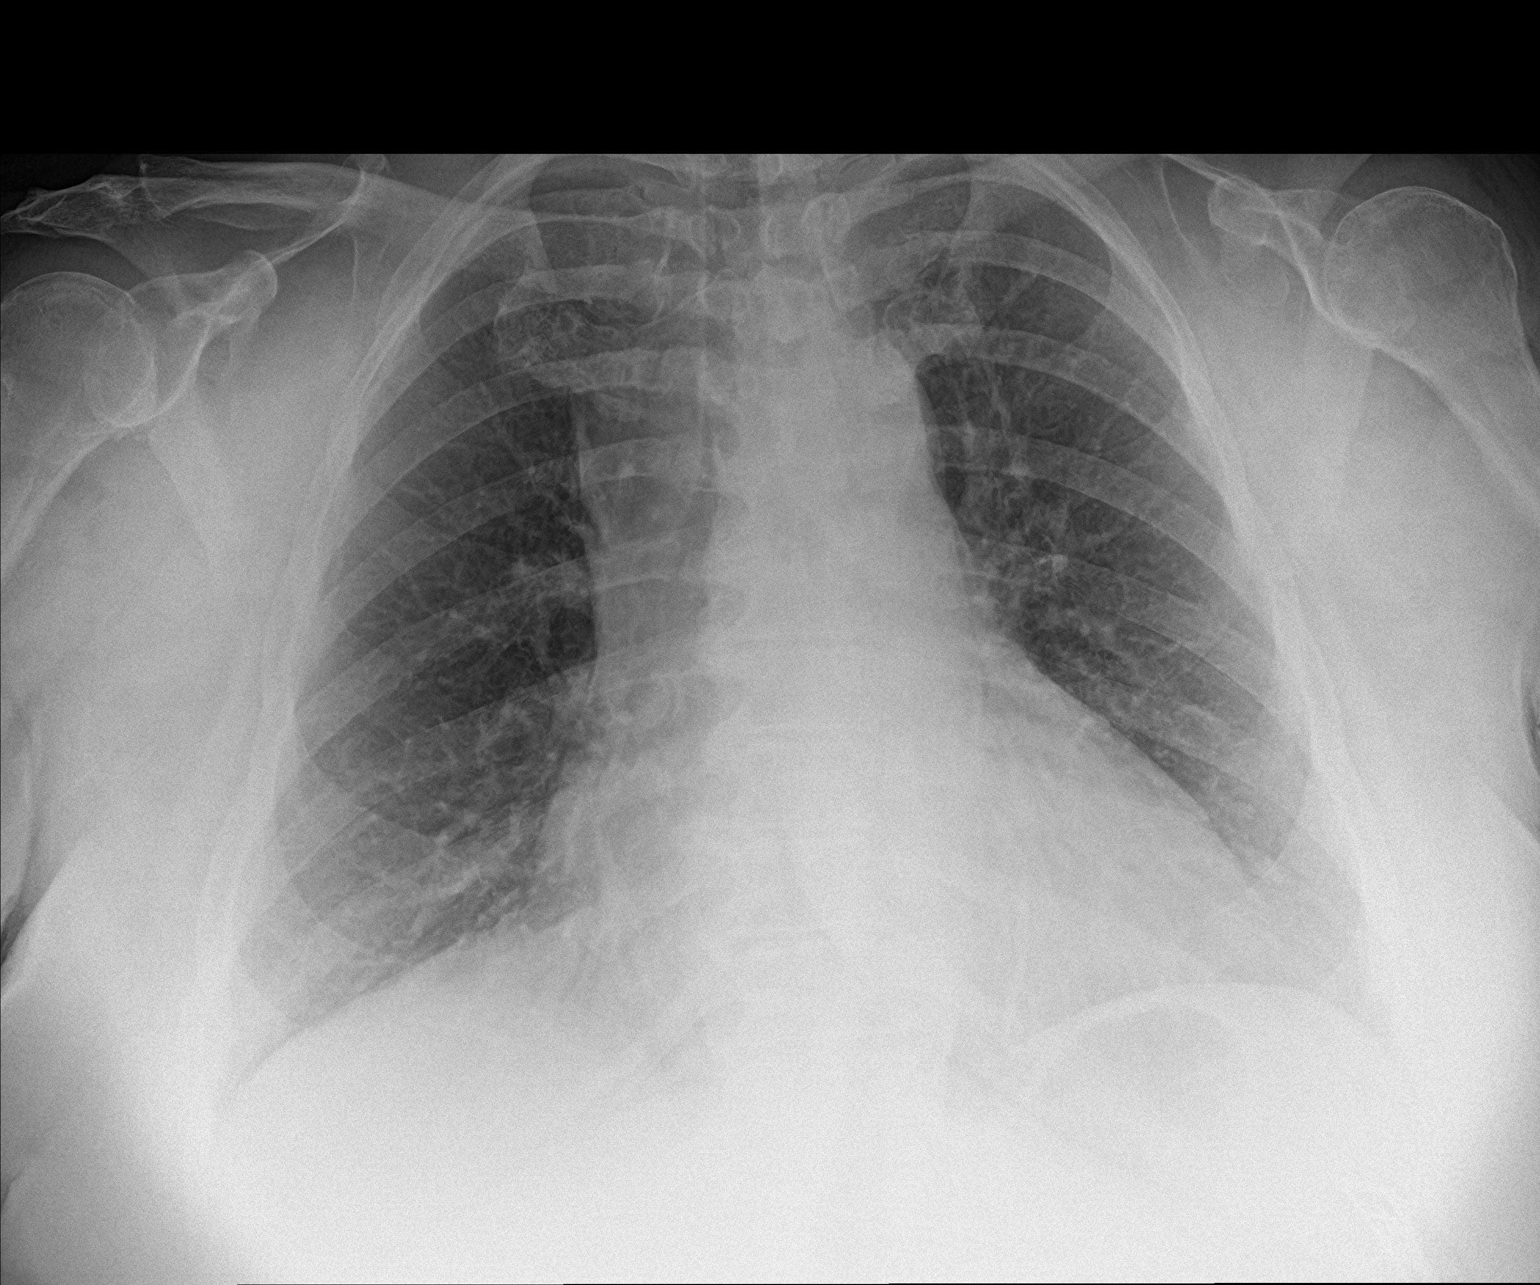

[2 of 2 positions shown; findings below may reference images not displayed]

FINDINGS: There is no evidence of acute infiltrate, pleural effusion or
pneumothorax. The cardiac silhouette is very mildly enlarged.
Multilevel degenerative changes seen throughout the thoracic spine.
The visualized skeletal structures are otherwise unremarkable.
IMPRESSION: No active cardiopulmonary disease.

## 2020-01-18 MED ORDER — LORAZEPAM 0.5 MG PO TABS
0.5000 mg | ORAL_TABLET | Freq: Once | ORAL | Status: AC
  Administered 2020-01-18: 0.5 mg via ORAL
  Filled 2020-01-18: qty 1

## 2020-01-18 NOTE — ED Notes (Signed)
Pt is discharges, waiting for her transportation. Pt was clam and cooperative during assessment. Pt denied SI, SIB & HI during assessment. Pt denied an cardiac or Rep distress and none was assessed. Pt sister Luetta Nutting was contacted for disposition, she was informed about her follow-up appointment, verbal report of lab and medication was given upon her request.

## 2020-01-18 NOTE — ED Provider Notes (Signed)
[facility] Emergency Department Provider Note  ____________________________________________  Time seen: Approximately 7:31 PM  I have reviewed the triage vital signs and the nursing notes.   HISTORY  Chief Complaint Anxiety    HPI Judith Shaw is a 69 y.o. female with a history of anxiety diabetes hypertension CHF morbid obesity who comes to the ED due to a feeling of chest pain that started about 2:00 PM today, lasted about 2 hours, and then resolved.  This occurred shortly after eating lunch  while waiting in a car for her friend to do grocery shopping.  No aggravating or alleviating factors, no shortness of breath diaphoresis or vomiting.  Associated feeling of anxiety which the patient reports is a chronic recurrent issue for her.  She also reports prominent issues with memory loss.     Past Medical History:  Diagnosis Date  . Anxiety   . Cancer ([place])    pt states hx of uterine cancer and had a complete hyst  . CHF (congestive heart failure) ([place])   . Depression   . Diabetes mellitus without complication ([place])   . Hyperlipidemia   . Hypertension      Patient Active Problem List   Diagnosis Date Noted  . Dementia without behavioral disturbance ([place]) 12/16/2019  . Acute osteomyelitis of left foot ([place]) 12/15/2019  . Uncontrolled type 2 diabetes mellitus with hyperglycemia, with long-term current use of insulin ([place]) 04/22/2019  . Panic attacks 03/26/2019  . Benign essential HTN 07/19/2018  . Fatty liver 05/15/2018  . Hx of adenomatous colonic polyps 05/15/2018  . Type 2 diabetes mellitus with diabetic nephropathy, with long-term current use of insulin ([place]) 05/01/2018  . Tinnitus, bilateral 04/05/2017  . Concussion syndrome 04/03/2017  . Concussion without loss of consciousness 01/24/2017  . Difficulty walking 01/24/2017  . Headache disorder 01/24/2017  . Numbness and tingling 01/24/2017  . Postural urinary incontinence 01/24/2017  . Sepsis  ([place]) 09/21/2016  . UTI (urinary tract infection) 09/21/2016  . HTN (hypertension) 09/21/2016  . Diabetes ([place]) 09/21/2016  . Depression 09/21/2016  . Health care maintenance 10/05/2015  . Recurrent major depressive disorder, in full remission ([place]) 06/16/2014  . DM (diabetes mellitus) type II controlled, neurological manifestation ([place]) 06/12/2014  . Morbid obesity ([place]) 06/12/2014  . Hyperlipidemia 03/10/2014  . Chronic diastolic CHF (congestive heart failure) ([place]) 03/10/2014  . H/O diastolic dysfunction [id number]  . Sleep apnea 03/10/2014  . SOB (shortness of breath) 03/10/2014     Past Surgical History:  Procedure Laterality Date  . ABDOMINAL HYSTERECTOMY    . AMPUTATION Left 12/19/2019   Procedure: AMPUTATION RAY Left 5th;  Surgeon: Caroline More, DPM;  Location: [facility] ORS;  Service: Podiatry;  Laterality: Left;  . CHOLECYSTECTOMY    . LOWER EXTREMITY ANGIOGRAPHY Left 12/17/2019   Procedure: Lower Extremity Angiography;  Surgeon: Algernon Huxley, MD;  Location: [place] [facility];  Service: Cardiovascular;  Laterality: Left;     Prior to Admission medications   Medication Sig Start Date End Date Taking? Authorizing Provider  acetaminophen (TYLENOL) 500 MG tablet Take 2 tablets (1,000 mg total) by mouth every 8 (eight) hours. 12/22/19   Mercy Riding, MD  amoxicillin-clavulanate (AUGMENTIN) 875-125 MG tablet Take 1 tablet by mouth 2 (two) times daily. 12/24/19   Jennye Boroughs, MD  aspirin EC 81 MG tablet Take 81 mg by mouth daily.    [provider]  Baclofen 5 MG TABS Take 5 mg by mouth at bedtime. 06/12/19  Laban Emperor, PA-C  clotrimazole-betamethasone (LOTRISONE) cream Apply 1 application topically 2 (two) times daily.    [provider]  donepezil (ARICEPT) 5 MG tablet Take 5 mg by mouth daily. 11/25/19   [provider]  DULoxetine HCl 60 MG CSDR Take 60 mg by mouth daily.     [provider]  enoxaparin (LOVENOX) 40 MG/0.4ML injection  Inject 0.4 mLs (40 mg total) into the skin daily for 17 days. 12/24/19 01/10/20  Jennye Boroughs, MD  furosemide (LASIX) 20 MG tablet Take 20 mg by mouth every other day.  11/29/16   [provider]  gentian violet 2 % topical solution Apply 0.5 mLs topically in the morning and at bedtime. Apply between the toes twice daily. 10/20/19   [provider]  insulin aspart (NOVOLOG) 100 UNIT/ML injection Inject 0-15 Units into the skin 3 (three) times daily with meals. CBG 70 - 120: 0 units CBG 121 - 150: 2 units CBG 151 - 200: 3 units CBG 201 - 250: 5 units CBG 251 - 300: 8 units CBG 301 - 350: 11 units CBG 351 - 400: 15 units CBG > 400: call MD and obtain STAT lab verification 12/22/19   Mercy Riding, MD  insulin aspart (NOVOLOG) 100 UNIT/ML injection Inject 6 Units into the skin 3 (three) times daily with meals. 12/22/19   Mercy Riding, MD  insulin glargine (LANTUS) 100 UNIT/ML injection Inject 0.6 mLs (60 Units total) into the skin 2 (two) times daily. 12/22/19   Mercy Riding, MD  ketoconazole (NIZORAL) 2 % cream Apply 1 application topically in the morning and at bedtime. 09/24/19   [provider]  metFORMIN (GLUCOPHAGE-XR) 500 MG 24 hr tablet Take 500 mg by mouth daily. With dinner.    [provider]  mirabegron ER (MYRBETRIQ) 25 MG TB24 tablet Take 1 tablet (25 mg total) by mouth daily. 07/01/19   Zara Council A, PA-C  potassium chloride (K-DUR,KLOR-CON) 10 MEQ tablet Take 10 mEq by mouth daily.    [provider]  ramipril (ALTACE) 5 MG capsule Take 5 mg by mouth daily.    [provider]  saccharomyces boulardii (FLORASTOR) 250 MG capsule Take 1 capsule (250 mg total) by mouth 2 (two) times daily. 12/22/19   Mercy Riding, MD  SANTYL ointment Apply 1 application topically daily. 11/28/19   [provider]  simvastatin (ZOCOR) 40 MG tablet Take 40 mg by mouth every morning.     [provider]  terconazole (TERAZOL 7) 0.4 %  vaginal cream Place 1 applicator vaginally at bedtime. 09/02/18   [provider]  traZODone (DESYREL) 50 MG tablet Take 0.5 tablets (25 mg total) by mouth at bedtime as needed for sleep. 12/22/19   Mercy Riding, MD  triamcinolone lotion (KENALOG) 0.1 % Apply 1 application topically 3 (three) times daily. 08/19/18   [provider]     Allergies Codeine   Family History  Problem Relation Age of Onset  . Hypertension Mother   . Heart disease Father   . Prostate cancer Brother   . Diabetes Maternal Aunt   . Kidney cancer Neg Hx   . Bladder Cancer Neg Hx     Social History Social History   Tobacco Use  . Smoking status: Never Smoker  . Smokeless tobacco: Never Used  Substance Use Topics  . Alcohol use: No  . Drug use: No    Review of Systems  Constitutional:   No fever  or chills.  ENT:   No sore throat. No rhinorrhea. Cardiovascular: Positive chest pain as above without syncope. Respiratory:   No dyspnea or cough. Gastrointestinal:   Negative for abdominal pain, vomiting and diarrhea.  Musculoskeletal:   Negative for focal pain or swelling All other systems reviewed and are negative except as documented above in ROS and HPI.  ____________________________________________   PHYSICAL EXAM:  VITAL SIGNS: ED Triage Vitals  Enc Vitals Group     BP 01/18/20 1636 (!) 150/45     Pulse Rate 01/18/20 1636 91     Resp --      Temp 01/18/20 1636 98.2 F (36.8 C)     Temp Source 01/18/20 1636 Oral     SpO2 01/18/20 1636 93 %     Weight --      Height --      Head Circumference --      Peak [place] --      Pain Score 01/18/20 1658 0     Pain Loc --      Pain Edu? --      Excl. in [place]? --     Vital signs reviewed, nursing assessments reviewed.   Constitutional:   Alert and oriented. Non-toxic appearance. Eyes:   Conjunctivae are normal. EOMI. PERRL. ENT      Head:   Normocephalic and atraumatic.      Nose:   Wearing a mask.      Mouth/Throat:    Wearing a mask.      Neck:   No meningismus. Full ROM. Hematological/Lymphatic/Immunilogical:   No cervical lymphadenopathy. Cardiovascular:   RRR. Symmetric bilateral radial and DP pulses.  No murmurs. Cap refill less than 2 seconds. Respiratory:   Normal respiratory effort without tachypnea/retractions. Breath sounds are clear and equal bilaterally. No wheezes/rales/rhonchi. Gastrointestinal:   Soft and nontender. Non distended. There is no CVA tenderness.  No rebound, rigidity, or guarding. Musculoskeletal:   Normal range of motion in all extremities. No joint effusions.  No lower extremity tenderness.  No edema. Neurologic:   Normal speech and language.  Motor grossly intact. No acute focal neurologic deficits are appreciated.  Skin:    Skin is warm, dry and intact. No rash noted.  No petechiae, purpura, or bullae.  ____________________________________________    LABS (pertinent positives/negatives) (all labs ordered are listed, but only abnormal results are displayed) Labs Reviewed  BASIC METABOLIC PANEL - Abnormal; Notable for the following components:      Result Value   Potassium 5.4 (*)    Glucose, Bld 296 (*)    BUN 25 (*)    GFR calc non Af Amer 58 (*)    All other components within normal limits  CBC  TROPONIN I (HIGH SENSITIVITY)  TROPONIN I (HIGH SENSITIVITY)   ____________________________________________   EKG  Interpreted by me Normal sinus rhythm rate of 86, normal axis and intervals.  Poor R wave progression.  Normal ST segments and T waves.  ____________________________________________    RADIOLOGY  DG Chest 2 View  Result Date: 01/18/2020 CLINICAL DATA:  Chest pain and shortness of breath. EXAM: CHEST - 2 VIEW COMPARISON:  Jan 15, 2020 FINDINGS: There is no evidence of acute infiltrate, pleural effusion or pneumothorax. The cardiac silhouette is very mildly enlarged. Multilevel degenerative changes seen throughout the thoracic spine. The visualized  skeletal structures are otherwise unremarkable. IMPRESSION: No active cardiopulmonary disease. Electronically Signed   By: Virgina Norfolk M.D.   On: 01/18/2020 17:45  ____________________________________________   PROCEDURES Procedures  ____________________________________________    CLINICAL IMPRESSION / ASSESSMENT AND PLAN / ED COURSE  Medications ordered in the ED: Medications  LORazepam (ATIVAN) tablet 0.5 mg (has no administration in time range)    Pertinent labs & imaging results that were available during my care of the patient were reviewed by me and considered in my medical decision making (see chart for details).  Judith Shaw was evaluated in Emergency Department on 01/18/2020 for the symptoms described in the history of present illness. She was evaluated in the context of the global COVID-19 pandemic, which necessitated consideration that the patient might be at risk for infection with the SARS-CoV-2 virus that causes COVID-19. Institutional protocols and algorithms that pertain to the evaluation of patients at risk for COVID-19 are in a state of rapid change based on information released by regulatory bodies including the CDC and federal and state organizations. These policies and algorithms were followed during the patient's care in the ED.   Patient presents with atypical chest pain and episodes of anxiety in the setting of known dementia and chronic anxiety feelings.  Memory loss is prominent for the patient.  She is nontoxic with unremarkable vital signs and reassuring exam, currently asymptomatic in the ED.  EKG, chest x-ray, labs are reassuring.   Considering the patient's symptoms, medical history, and physical examination today, I have low suspicion for ACS, PE, TAD, pneumothorax, carditis, mediastinitis, pneumonia, CHF, or sepsis.  I will offer her a single dose of low-dose Ativan, recommend follow-up with her primary care doctor for further assessment of how to  manage her symptoms.     ____________________________________________   FINAL CLINICAL IMPRESSION(S) / ED DIAGNOSES    Final diagnoses:  Anxiety  Memory difficulty     ED Discharge Orders    None      Portions of this note were generated with dragon dictation software. Dictation errors may occur despite best attempts at proofreading.   Carrie Mew, MD 01/18/20 [place]

## 2020-01-18 NOTE — ED Triage Notes (Signed)
Pt has been upset and crying at night even though nothing wrong per caregiver.  Caregiver reports panic attacks.  Was here Thursday and pt left hospital since caregiver was not here.  Has vascular dementia.  trazadone was stopped when at [employer]. Reports blood sugar was a little up after lunch.  C/o chest hurting per caregiver.  Was having trouble breathing as well. Pt reports "I am afraid of nothing".

## 2020-01-18 NOTE — ED Notes (Signed)
Pt was given a cup of diet soda with ice, no lid or straw.

## 2020-01-18 NOTE — ED Notes (Signed)
Pt reports she is here because she was out to lunch with her caregiver and started having mid back pain shooting to center of chest. Pt denies any pain at this time, pt states she has been having difficulty sleeping off and on at night, pt states she has hx of anxiety.  Initially on contact pt stated she could not remember why she was here, pt does have hx of dementia no family member present at this time.

## 2020-01-19 ENCOUNTER — Encounter: Payer: Self-pay | Admitting: Emergency Medicine

## 2020-01-19 ENCOUNTER — Emergency Department
Admission: EM | Admit: 2020-01-19 | Discharge: 2020-01-19 | Disposition: A | Discharge status: Home / Self Care | Payer: Medicare PPO | Attending: Emergency Medicine | Admitting: Emergency Medicine

## 2020-01-19 DIAGNOSIS — Z5321 Procedure and treatment not carried out due to patient leaving prior to being seen by health care provider: Secondary | ICD-10-CM | POA: Insufficient documentation

## 2020-01-19 DIAGNOSIS — F410 Panic disorder [episodic paroxysmal anxiety] without agoraphobia: Secondary | ICD-10-CM | POA: Diagnosis present

## 2020-01-19 NOTE — ED Triage Notes (Signed)
States "I'm having a major anxiety attack".  Was seen last night for same.  AAOx3.  Skin warm and dry. NAD

## 2020-02-24 ENCOUNTER — Encounter (INDEPENDENT_AMBULATORY_CARE_PROVIDER_SITE_OTHER): Payer: Self-pay | Admitting: Vascular Surgery

## 2020-02-24 ENCOUNTER — Other Ambulatory Visit: Payer: Self-pay

## 2020-02-24 ENCOUNTER — Ambulatory Visit (INDEPENDENT_AMBULATORY_CARE_PROVIDER_SITE_OTHER): Payer: Medicare PPO | Admitting: Vascular Surgery

## 2020-02-24 VITALS — BP 145/73 | HR 75 | Ht 65.0 in | Wt 270.0 lb

## 2020-02-24 DIAGNOSIS — I7025 Atherosclerosis of native arteries of other extremities with ulceration: Secondary | ICD-10-CM | POA: Diagnosis not present

## 2020-02-24 DIAGNOSIS — E1140 Type 2 diabetes mellitus with diabetic neuropathy, unspecified: Secondary | ICD-10-CM

## 2020-02-24 DIAGNOSIS — I10 Essential (primary) hypertension: Secondary | ICD-10-CM

## 2020-02-24 DIAGNOSIS — Z794 Long term (current) use of insulin: Secondary | ICD-10-CM

## 2020-02-24 DIAGNOSIS — E785 Hyperlipidemia, unspecified: Secondary | ICD-10-CM

## 2020-02-24 NOTE — Progress Notes (Signed)
Patient ID: Judith Shaw, female   DOB: 1951-05-05, 69 y.o.   MRN: [id number]  Chief Complaint  Patient presents with   New Patient (Initial Visit)    PVD    HPI Judith Shaw is a 69 y.o. female.  I am asked to see the patient by Dr. Luana Shu for evaluation of her peripheral arterial disease.  She was seen in the hospital and had an angiogram performed with tibial intervention about 2 months ago.  Since that time, she has undergone a left fifth toe amputation which seems to be healing reasonably well. Her access site is well healed.  She has no significant LE swelling. The pain is improved.     Past Medical History:  Diagnosis Date   Anxiety    Cancer ([place])    pt states hx of uterine cancer and had a complete hyst   CHF (congestive heart failure) ([place])    Depression    Diabetes mellitus without complication ([employer])    Hyperlipidemia    Hypertension     Past Surgical History:  Procedure Laterality Date   ABDOMINAL HYSTERECTOMY     AMPUTATION Left 12/19/2019   Procedure: AMPUTATION RAY Left 5th;  Surgeon: Caroline More, DPM;  Location: [facility];  Service: Podiatry;  Laterality: Left;   CHOLECYSTECTOMY     LOWER EXTREMITY ANGIOGRAPHY Left 12/17/2019   Procedure: Lower Extremity Angiography;  Surgeon: Algernon Huxley, MD;  Location: [facility];  Service: Cardiovascular;  Laterality: Left;     Family History  Problem Relation Age of Onset   Hypertension Mother    Heart disease Father    Prostate cancer Brother    Diabetes Maternal Aunt    Kidney cancer Neg Hx    Bladder Cancer Neg Hx      Social History   Tobacco Use   Smoking status: Never Smoker   Smokeless tobacco: Never Used  Substance Use Topics   Alcohol use: No   Drug use: No    Allergies  Allergen Reactions   Codeine     Current Outpatient Medications  Medication Sig Dispense Refill   acetaminophen (TYLENOL) 500 MG tablet Take 2 tablets (1,000 mg total) by mouth every 8  (eight) hours. 30 tablet 0   amoxicillin-clavulanate (AUGMENTIN) 875-125 MG tablet Take 1 tablet by mouth 2 (two) times daily. 8 tablet 0   aspirin EC 81 MG tablet Take 81 mg by mouth daily.     Baclofen 5 MG TABS Take 5 mg by mouth at bedtime. 5 tablet 0   clotrimazole-betamethasone (LOTRISONE) cream Apply 1 application topically 2 (two) times daily.     donepezil (ARICEPT) 5 MG tablet Take 5 mg by mouth daily.     DULoxetine (CYMBALTA) 60 MG capsule      DULoxetine HCl 60 MG CSDR Take 60 mg by mouth daily.      furosemide (LASIX) 20 MG tablet Take 20 mg by mouth every other day.      gentian violet 2 % topical solution Apply 0.5 mLs topically in the morning and at bedtime. Apply between the toes twice daily.     insulin aspart (NOVOLOG) 100 UNIT/ML injection Inject 0-15 Units into the skin 3 (three) times daily with meals. CBG 70 - 120: 0 units CBG 121 - 150: 2 units CBG 151 - 200: 3 units CBG 201 - 250: 5 units CBG 251 - 300: 8 units CBG 301 - 350: 11 units CBG 351 - 400: 15  units CBG > 400: call MD and obtain STAT lab verification 10 mL 11   insulin aspart (NOVOLOG) 100 UNIT/ML injection Inject 6 Units into the skin 3 (three) times daily with meals. 10 mL 11   insulin glargine (LANTUS) 100 UNIT/ML injection Inject 0.6 mLs (60 Units total) into the skin 2 (two) times daily. 10 mL 11   ketoconazole (NIZORAL) 2 % cream Apply 1 application topically in the morning and at bedtime.     metFORMIN (GLUCOPHAGE) 500 MG tablet      metFORMIN (GLUCOPHAGE-XR) 500 MG 24 hr tablet Take 500 mg by mouth daily. With dinner.     mirabegron ER (MYRBETRIQ) 25 MG TB24 tablet Take 1 tablet (25 mg total) by mouth daily. 30 tablet 0   potassium chloride (K-DUR,KLOR-CON) 10 MEQ tablet Take 10 mEq by mouth daily.     potassium chloride (KLOR-CON) 10 MEQ tablet      QUEtiapine (SEROQUEL) 25 MG tablet Take by mouth.     ramipril (ALTACE) 5 MG capsule Take 5 mg by mouth daily.      saccharomyces boulardii (FLORASTOR) 250 MG capsule Take 1 capsule (250 mg total) by mouth 2 (two) times daily. 10 capsule 0   SANTYL ointment Apply 1 application topically daily.     simvastatin (ZOCOR) 40 MG tablet Take 40 mg by mouth every morning.      terconazole (TERAZOL 7) 0.4 % vaginal cream Place 1 applicator vaginally at bedtime.     traZODone (DESYREL) 50 MG tablet Take 0.5 tablets (25 mg total) by mouth at bedtime as needed for sleep. 30 tablet 1   triamcinolone cream (KENALOG) 0.1 %      triamcinolone lotion (KENALOG) 0.1 % Apply 1 application topically 3 (three) times daily.     enoxaparin (LOVENOX) 40 MG/0.4ML injection Inject 0.4 mLs (40 mg total) into the skin daily for 17 days. 6.8 mL 0   No current facility-administered medications for this visit.      REVIEW OF SYSTEMS (Negative unless checked)  Constitutional: [] Weight loss  [] Fever  [] Chills Cardiac: [] Chest pain   [] Chest pressure   [] Palpitations   [] Shortness of breath when laying flat   [] Shortness of breath at rest   [] Shortness of breath with exertion. Vascular:  [x] Pain in legs with walking   [] Pain in legs at rest   [] Pain in legs when laying flat   [] Claudication   [] Pain in feet when walking  [] Pain in feet at rest  [] Pain in feet when laying flat   [] History of DVT   [] Phlebitis   [] Swelling in legs   [] Varicose veins   [x] Non-healing ulcers Pulmonary:   [] Uses home oxygen   [] Productive cough   [] Hemoptysis   [] Wheeze  [] COPD   [] Asthma Neurologic:  [] Dizziness  [] Blackouts   [] Seizures   [] History of stroke   [] History of TIA  [] Aphasia   [] Temporary blindness   [] Dysphagia   [] Weakness or numbness in arms   [] Weakness or numbness in legs Musculoskeletal:  [] Arthritis   [] Joint swelling   [] Joint pain   [] Low back pain Hematologic:  [] Easy bruising  [] Easy bleeding   [] Hypercoagulable state   [] Anemic  [] Hepatitis Gastrointestinal:  [] Blood in stool   [] Vomiting blood  [] Gastroesophageal  reflux/heartburn   [] Abdominal pain Genitourinary:  [] Chronic kidney disease   [] Difficult urination  [] Frequent urination  [] Burning with urination   [] Hematuria Skin:  [] Rashes   [x] Ulcers   [x] Wounds Psychological:  [] History of anxiety   []   History of major depression.    Physical Exam BP (!) 145/73    Pulse 75    Ht 5\' 5"  (1.651 m)    Wt 270 lb (122.5 kg)    BMI 44.93 kg/m  Gen:  WD/WN, NAD Head: [place]/AT, No temporalis wasting.  Ear/Nose/Throat: Hearing grossly intact, nares w/o erythema or drainage, oropharynx w/o Erythema/Exudate Eyes: Conjunctiva clear, sclera non-icteric  Neck: trachea midline.  No JVD.  Pulmonary:  Good air movement, respirations not labored, no use of accessory muscles  Cardiac: RRR, no JVD Vascular:  Vessel Right Left  Radial Palpable Palpable                          DP 2+ 1+  PT 1+ 2+   Gastrointestinal:. No masses, surgical incisions, or scars. Musculoskeletal: M/S 5/5 throughout.  Extremities without ischemic changes.  No deformity or atrophy. Left foot wrapped and in a walking boot.  No edema. Neurologic: Sensation grossly intact in extremities.  Symmetrical.  Speech is fluent. Motor exam as listed above. Psychiatric: Judgment intact, Mood & affect appropriate for pt's clinical situation. Dermatologic: No rashes or ulcers noted.  No cellulitis or open wounds.    Radiology No results found.  Labs Recent Results (from the past 2160 hour(s))  CBC with Differential     Status: None   Collection Time: 12/15/19  5:33 PM  Result Value Ref Range   WBC 7.4 4.0 - 10.5 K/uL   RBC 4.72 3.87 - 5.11 MIL/uL   Hemoglobin 14.2 12.0 - 15.0 g/dL   HCT 42.7 36 - 46 %   MCV 90.5 80.0 - 100.0 fL   MCH 30.1 26.0 - 34.0 pg   MCHC 33.3 30.0 - 36.0 g/dL   RDW 12.4 11.5 - 15.5 %   Platelets 241 150 - 400 K/uL   nRBC 0.0 0.0 - 0.2 %   Neutrophils Relative % 70 %   Neutro Abs 5.2 1.7 - 7.7 K/uL   Lymphocytes Relative 21 %   Lymphs Abs 1.6 0.7 - 4.0 K/uL    Monocytes Relative 7 %   Monocytes Absolute 0.5 0 - 1 K/uL   Eosinophils Relative 2 %   Eosinophils Absolute 0.1 0 - 0 K/uL   Basophils Relative 0 %   Basophils Absolute 0.0 0 - 0 K/uL   Immature Granulocytes 0 %   Abs Immature Granulocytes 0.03 0.00 - 0.07 K/uL    Comment: Performed at [facility], [address] [place], [place] [address]  Comprehensive metabolic panel     Status: Abnormal   Collection Time: 12/15/19  5:33 PM  Result Value Ref Range   Sodium 132 (L) 135 - 145 mmol/L   Potassium 4.9 3.5 - 5.1 mmol/L   Chloride 102 98 - 111 mmol/L   CO2 23 22 - 32 mmol/L   Glucose, Bld 281 (H) 70 - 99 mg/dL    Comment: Glucose reference range applies only to samples taken after fasting for at least 8 hours.   BUN 27 (H) 8 - 23 mg/dL   Creatinine, Ser 0.99 0.44 - 1.00 mg/dL   Calcium 8.9 8.9 - 10.3 mg/dL   Total Protein 7.2 6.5 - 8.1 g/dL   Albumin 3.1 (L) 3.5 - 5.0 g/dL   AST 22 15 - 41 U/L   ALT 25 0 - 44 U/L   Alkaline Phosphatase 81 38 - 126 U/L   Total Bilirubin 0.5 0.3 - 1.2 mg/dL  GFR calc non Af Amer 58 (L) >60 mL/min   GFR calc Af Amer >60 >60 mL/min   Anion gap 7 5 - 15    Comment: Performed at [facility], [place], [place] [address]  Lactic acid, plasma     Status: Abnormal   Collection Time: 12/15/19  5:33 PM  Result Value Ref Range   Lactic Acid, Venous 2.1 (HH) 0.5 - 1.9 mmol/L    Comment: CRITICAL RESULT CALLED TO, READ BACK BY AND VERIFIED WITH ANNA HOLT, RN AT [phone] ON 12/15/19 SNG Performed at [facility], [address] [place], [place] [address]   Hemoglobin A1c     Status: Abnormal   Collection Time: 12/15/19  5:33 PM  Result Value Ref Range   Hgb A1c MFr Bld 11.3 (H) 4.8 - 5.6 %    Comment: (NOTE) Pre diabetes:          5.7%-6.4% Diabetes:              >6.4% Glycemic control for   <7.0% adults with diabetes    Mean Plasma Glucose 277.61 mg/dL    Comment: Performed at [facility] [address], [place], [place] [address]  Lactic acid, plasma     Status: Abnormal   Collection Time: 12/15/19  9:17 PM  Result Value Ref Range   Lactic Acid, Venous 2.0 (HH) 0.5 - 1.9 mmol/L    Comment: CRITICAL RESULT CALLED TO, READ BACK BY AND VERIFIED WITH [facility] BARKER AT 2151 ON 12/15/19 RWW Performed at [facility], [address] [place], [place] [address]   SARS CORONAVIRUS 2 (TAT 6-24 HRS) Nasopharyngeal Nasopharyngeal Swab     Status: None   Collection Time: 12/15/19  9:57 PM   Specimen: Nasopharyngeal Swab  Result Value Ref Range   SARS Coronavirus 2 NEGATIVE NEGATIVE    Comment: (NOTE) SARS-CoV-2 target nucleic acids are NOT DETECTED. The SARS-CoV-2 RNA is generally detectable in upper and lower respiratory specimens during the acute phase of infection. Negative results do not preclude SARS-CoV-2 infection, do not rule out co-infections with other pathogens, and should not be used as the sole basis for treatment or other patient management decisions. Negative results must be combined with clinical observations, patient history, and epidemiological information. The expected result is Negative. Fact Sheet for Patients: SugarRoll.be Fact Sheet for Healthcare Providers: https://www.woods-mathews.com/ This test is not yet approved or cleared by the [place] FDA and  has been authorized for detection and/or diagnosis of SARS-CoV-2 by FDA under an Emergency Use Authorization (EUA). This EUA will remain  in effect (meaning this test can be used) for the duration of the COVID-19 declaration under Section 56 4(b)(1) of the Act, 21 U.S.C. section 360bbb-3(b)(1), unless the authorization is terminated or revoked sooner. Performed at [facility], [place] [address] [place],  [address]   Glucose, capillary     Status: None   Collection Time: 12/16/19 12:23 AM  Result Value Ref Range   Glucose-Capillary 94 70 - 99 mg/dL     Comment: Glucose reference range applies only to samples taken after fasting for at least 8 hours.  Basic metabolic panel     Status: Abnormal   Collection Time: 12/16/19  4:36 AM  Result Value Ref Range   Sodium 138 135 - 145 mmol/L   Potassium 4.2 3.5 - 5.1 mmol/L   Chloride 105 98 - 111 mmol/L   CO2 26 22 - 32 mmol/L  Glucose, Bld 147 (H) 70 - 99 mg/dL    Comment: Glucose reference range applies only to samples taken after fasting for at least 8 hours.   BUN 25 (H) 8 - 23 mg/dL   Creatinine, Ser 1.09 (H) 0.44 - 1.00 mg/dL   Calcium 8.2 (L) 8.9 - 10.3 mg/dL   GFR calc non Af Amer 52 (L) >60 mL/min   GFR calc Af Amer 60 (L) >60 mL/min   Anion gap 7 5 - 15    Comment: Performed at [facility], [address] [place], [place] [address]  CBC     Status: None   Collection Time: 12/16/19  4:36 AM  Result Value Ref Range   WBC 7.4 4.0 - 10.5 K/uL   RBC 4.44 3.87 - 5.11 MIL/uL   Hemoglobin 13.4 12.0 - 15.0 g/dL   HCT 39.2 36 - 46 %   MCV 88.3 80.0 - 100.0 fL   MCH 30.2 26.0 - 34.0 pg   MCHC 34.2 30.0 - 36.0 g/dL   RDW 12.7 11.5 - 15.5 %   Platelets 225 150 - 400 K/uL   nRBC 0.0 0.0 - 0.2 %    Comment: Performed at [facility], [address] [place], [place] [address]  Glucose, capillary     Status: Abnormal   Collection Time: 12/16/19  5:03 AM  Result Value Ref Range   Glucose-Capillary 125 (H) 70 - 99 mg/dL    Comment: Glucose reference range applies only to samples taken after fasting for at least 8 hours.  Glucose, capillary     Status: None   Collection Time: 12/16/19  7:00 AM  Result Value Ref Range   Glucose-Capillary 95 70 - 99 mg/dL    Comment: Glucose reference range applies only to samples taken after fasting for at least 8 hours.  Glucose, capillary     Status: None   Collection Time: 12/16/19  9:26 AM  Result Value Ref Range   Glucose-Capillary 85 70 - 99 mg/dL    Comment: Glucose reference range applies only to samples taken after  fasting for at least 8 hours.  Glucose, capillary     Status: None   Collection Time: 12/16/19 11:36 AM  Result Value Ref Range   Glucose-Capillary 75 70 - 99 mg/dL    Comment: Glucose reference range applies only to samples taken after fasting for at least 8 hours.  Aerobic/Anaerobic Culture (surgical/deep wound)     Status: None   Collection Time: 12/16/19  3:35 PM   Specimen: Wound  Result Value Ref Range   Specimen Description      WOUND Performed at [facility], [address] [place], [place] [address]    Special Requests      NONE Performed at [facility], [place], [place] [address]    Gram Stain      NO WBC SEEN ABUNDANT GRAM POSITIVE COCCI FEW GRAM NEGATIVE RODS    Culture      MODERATE STREPTOCOCCUS ANGINOSIS FEW BACTEROIDES THETAIOTAOMICRON BETA LACTAMASE POSITIVE Performed at [facility], [place] [address], [place], [place] [address]    Report Status 12/19/2019 FINAL    Organism ID, Bacteria STREPTOCOCCUS ANGINOSIS       Susceptibility   Streptococcus anginosis - MIC*    PENICILLIN 0.12 SENSITIVE Sensitive     CEFTRIAXONE 0.5 SENSITIVE Sensitive     ERYTHROMYCIN >=8 RESISTANT Resistant     LEVOFLOXACIN 0.5 SENSITIVE Sensitive  VANCOMYCIN 0.5 SENSITIVE Sensitive     * MODERATE STREPTOCOCCUS ANGINOSIS  Glucose, capillary     Status: Abnormal   Collection Time: 12/16/19  4:53 PM  Result Value Ref Range   Glucose-Capillary 253 (H) 70 - 99 mg/dL    Comment: Glucose reference range applies only to samples taken after fasting for at least 8 hours.   Comment 1 Notify RN   Glucose, capillary     Status: Abnormal   Collection Time: 12/16/19  9:29 PM  Result Value Ref Range   Glucose-Capillary 270 (H) 70 - 99 mg/dL    Comment: Glucose reference range applies only to samples taken after fasting for at least 8 hours.   Comment 1 Notify RN   Glucose, capillary     Status: Abnormal   Collection Time: 12/17/19  4:08 AM    Result Value Ref Range   Glucose-Capillary 196 (H) 70 - 99 mg/dL    Comment: Glucose reference range applies only to samples taken after fasting for at least 8 hours.  Basic metabolic panel     Status: Abnormal   Collection Time: 12/17/19  5:13 AM  Result Value Ref Range   Sodium 138 135 - 145 mmol/L   Potassium 4.7 3.5 - 5.1 mmol/L   Chloride 106 98 - 111 mmol/L   CO2 25 22 - 32 mmol/L   Glucose, Bld 231 (H) 70 - 99 mg/dL    Comment: Glucose reference range applies only to samples taken after fasting for at least 8 hours.   BUN 20 8 - 23 mg/dL   Creatinine, Ser 0.99 0.44 - 1.00 mg/dL   Calcium 8.3 (L) 8.9 - 10.3 mg/dL   GFR calc non Af Amer 58 (L) >60 mL/min   GFR calc Af Amer >60 >60 mL/min   Anion gap 7 5 - 15    Comment: Performed at [facility], [address] [place], [place] [address]  Glucose, capillary     Status: Abnormal   Collection Time: 12/17/19  8:22 AM  Result Value Ref Range   Glucose-Capillary 224 (H) 70 - 99 mg/dL    Comment: Glucose reference range applies only to samples taken after fasting for at least 8 hours.  Surgical pcr screen     Status: Abnormal   Collection Time: 12/17/19  8:53 AM   Specimen: Nasal Mucosa; Nasal Swab  Result Value Ref Range   MRSA, PCR NEGATIVE NEGATIVE   Staphylococcus aureus POSITIVE (A) NEGATIVE    Comment: (NOTE) The Xpert SA Assay (FDA approved for NASAL specimens in patients 37 years of age and older), is one component of a comprehensive surveillance program. It is not intended to diagnose infection nor to guide or monitor treatment. Performed at [facility], [address] [place], [place] [address]   Glucose, capillary     Status: Abnormal   Collection Time: 12/17/19  2:01 PM  Result Value Ref Range   Glucose-Capillary 186 (H) 70 - 99 mg/dL    Comment: Glucose reference range applies only to samples taken after fasting for at least 8 hours.  Glucose, capillary     Status: Abnormal   Collection  Time: 12/17/19  3:21 PM  Result Value Ref Range   Glucose-Capillary 208 (H) 70 - 99 mg/dL    Comment: Glucose reference range applies only to samples taken after fasting for at least 8 hours.  Glucose, capillary     Status: Abnormal   Collection Time: 12/17/19  5:22 PM  Result Value Ref Range   Glucose-Capillary 247 (H) 70 - 99 mg/dL    Comment: Glucose reference range applies only to samples taken after fasting for at least 8 hours.  Glucose, capillary     Status: Abnormal   Collection Time: 12/17/19 10:08 PM  Result Value Ref Range   Glucose-Capillary 309 (H) 70 - 99 mg/dL    Comment: Glucose reference range applies only to samples taken after fasting for at least 8 hours.  Glucose, capillary     Status: Abnormal   Collection Time: 12/18/19  4:12 AM  Result Value Ref Range   Glucose-Capillary 204 (H) 70 - 99 mg/dL    Comment: Glucose reference range applies only to samples taken after fasting for at least 8 hours.  CBC     Status: None   Collection Time: 12/18/19  5:05 AM  Result Value Ref Range   WBC 7.1 4.0 - 10.5 K/uL   RBC 4.54 3.87 - 5.11 MIL/uL   Hemoglobin 13.8 12.0 - 15.0 g/dL   HCT 41.3 36 - 46 %   MCV 91.0 80.0 - 100.0 fL   MCH 30.4 26.0 - 34.0 pg   MCHC 33.4 30.0 - 36.0 g/dL   RDW 12.7 11.5 - 15.5 %   Platelets 295 150 - 400 K/uL   nRBC 0.0 0.0 - 0.2 %    Comment: Performed at [facility], [address], [place], [place] [address]  Basic metabolic panel     Status: Abnormal   Collection Time: 12/18/19  5:05 AM  Result Value Ref Range   Sodium 137 135 - 145 mmol/L   Potassium 4.0 3.5 - 5.1 mmol/L   Chloride 106 98 - 111 mmol/L   CO2 25 22 - 32 mmol/L   Glucose, Bld 217 (H) 70 - 99 mg/dL    Comment: Glucose reference range applies only to samples taken after fasting for at least 8 hours.   BUN 14 8 - 23 mg/dL   Creatinine, Ser 0.92 0.44 - 1.00 mg/dL   Calcium 8.0 (L) 8.9 - 10.3 mg/dL   GFR calc non Af Amer >60 >60 mL/min   GFR calc Af Amer >60 >60  mL/min   Anion gap 6 5 - 15    Comment: Performed at [facility], [address] [place], [place] [address]  Glucose, capillary     Status: Abnormal   Collection Time: 12/18/19  9:35 AM  Result Value Ref Range   Glucose-Capillary 281 (H) 70 - 99 mg/dL    Comment: Glucose reference range applies only to samples taken after fasting for at least 8 hours.  Glucose, capillary     Status: Abnormal   Collection Time: 12/18/19 11:20 AM  Result Value Ref Range   Glucose-Capillary 280 (H) 70 - 99 mg/dL    Comment: Glucose reference range applies only to samples taken after fasting for at least 8 hours.  Glucose, capillary     Status: Abnormal   Collection Time: 12/18/19 12:10 PM  Result Value Ref Range   Glucose-Capillary 254 (H) 70 - 99 mg/dL    Comment: Glucose reference range applies only to samples taken after fasting for at least 8 hours.  Glucose, capillary     Status: Abnormal   Collection Time: 12/18/19  3:44 PM  Result Value Ref Range   Glucose-Capillary 318 (H) 70 - 99 mg/dL    Comment: Glucose reference range applies only to samples taken after fasting for at least 8 hours.  Glucose, capillary     Status: Abnormal   Collection Time: 12/18/19  9:45 PM  Result Value Ref Range   Glucose-Capillary 322 (H) 70 - 99 mg/dL    Comment: Glucose reference range applies only to samples taken after fasting for at least 8 hours.  Creatinine, serum     Status: Abnormal   Collection Time: 12/19/19  3:51 AM  Result Value Ref Range   Creatinine, Ser 1.07 (H) 0.44 - 1.00 mg/dL   GFR calc non Af Amer 53 (L) >60 mL/min   GFR calc Af Amer >60 >60 mL/min    Comment: Performed at [facility], [address] [place], [place] [address]  Glucose, capillary     Status: Abnormal   Collection Time: 12/19/19  7:53 AM  Result Value Ref Range   Glucose-Capillary 211 (H) 70 - 99 mg/dL    Comment: Glucose reference range applies only to samples taken after fasting for at least 8 hours.    Glucose, capillary     Status: Abnormal   Collection Time: 12/19/19 11:41 AM  Result Value Ref Range   Glucose-Capillary 187 (H) 70 - 99 mg/dL    Comment: Glucose reference range applies only to samples taken after fasting for at least 8 hours.  Glucose, capillary     Status: Abnormal   Collection Time: 12/19/19  1:31 PM  Result Value Ref Range   Glucose-Capillary 168 (H) 70 - 99 mg/dL    Comment: Glucose reference range applies only to samples taken after fasting for at least 8 hours.  Aerobic/Anaerobic Culture (surgical/deep wound)     Status: None   Collection Time: 12/19/19  2:26 PM   Specimen: Wound  Result Value Ref Range   Specimen Description      WOUND Performed at [facility], [address], [place], [place] [address]    Special Requests      NONE Performed at [facility], [place], [place] [address]    Gram Stain      RARE WBC PRESENT, PREDOMINANTLY PMN NO ORGANISMS SEEN    Culture      No growth aerobically or anaerobically. Performed at [facility], [place] [address], [place], [place] [address]    Report Status 12/25/2019 FINAL   Surgical pathology     Status: None   Collection Time: 12/19/19  2:32 PM  Result Value Ref Range   SURGICAL PATHOLOGY      SURGICAL PATHOLOGY CASE: [id number] PATIENT: Temisha Escudero Surgical Pathology Report     Specimen Submitted: A. 5th toe, left  Clinical History: Osteomyelitis fifth ray left      DIAGNOSIS: A. 5TH TOE, LEFT; AMPUTATION: - ACUTE OSTEOMYELITIS. - RESECTION MARGINS ARE NEGATIVE FOR ACTIVE INFLAMMATION. - NEGATIVE FOR MALIGNANCY.   GROSS DESCRIPTION: A. Labeled: Left fifth toe Received: In formalin Size: 5.3 x 1.5 x 1.5 cm Description of lesion(s): The skin surface is slightly granular, but a discrete ulceration is not grossly noted. Proximal margin: The skin and soft tissue at the proximal margin resection appears grossly viable Bone: The bone at the  proximal margin of the toe is unremarkable.  This area is inked blue.  Separately submitted in the same container is a piece of bone that measures 2.1 x 1.2 x 0.8 cm which is inked blue at one end to designate the proximal margin.  Block summary: 1 -skin and soft tissue at proximal margin of resection 2-p erpendicular section of bone of toe  with inked margin, after decalcification 3-perpendicular section of separately submitted bone after decalcification  Tissue decalcification: Cassettes 2 and 3   Final Diagnosis performed by Betsy Pries, MD.   Electronically signed 12/23/2019 12:22:56PM The electronic signature indicates that the named Attending Pathologist has evaluated the specimen Technical component performed at [facility], [address], [place], [place] [address] Lab: [phone] Dir: Rush Farmer, MD, MMM  Professional component performed at [facility], [facility], [place], [place], [place] [address] Lab: [phone] Dir: Dellia Nims. Rubinas, MD   Glucose, capillary     Status: Abnormal   Collection Time: 12/19/19  3:09 PM  Result Value Ref Range   Glucose-Capillary 162 (H) 70 - 99 mg/dL    Comment: Glucose reference range applies only to samples taken after fasting for at least 8 hours.  Glucose, capillary     Status: Abnormal   Collection Time: 12/19/19  5:50 PM  Result Value Ref Range   Glucose-Capillary 201 (H) 70 - 99 mg/dL    Comment: Glucose reference range applies only to samples taken after fasting for at least 8 hours.   Comment 1 Notify RN    Comment 2 Document in Chart   Glucose, capillary     Status: Abnormal   Collection Time: 12/19/19  8:59 PM  Result Value Ref Range   Glucose-Capillary 355 (H) 70 - 99 mg/dL    Comment: Glucose reference range applies only to samples taken after fasting for at least 8 hours.  Glucose, capillary     Status: Abnormal   Collection Time: 12/20/19  7:44 AM  Result Value Ref Range    Glucose-Capillary 197 (H) 70 - 99 mg/dL    Comment: Glucose reference range applies only to samples taken after fasting for at least 8 hours.  Creatinine, serum     Status: Abnormal   Collection Time: 12/20/19  8:47 AM  Result Value Ref Range   Creatinine, Ser 1.03 (H) 0.44 - 1.00 mg/dL   GFR calc non Af Amer 55 (L) >60 mL/min   GFR calc Af Amer >60 >60 mL/min    Comment: Performed at [facility], [address] [place], [place] [address]  Glucose, capillary     Status: Abnormal   Collection Time: 12/20/19 12:03 PM  Result Value Ref Range   Glucose-Capillary 227 (H) 70 - 99 mg/dL    Comment: Glucose reference range applies only to samples taken after fasting for at least 8 hours.   Comment 1 Notify RN   Glucose, capillary     Status: Abnormal   Collection Time: 12/20/19  4:34 PM  Result Value Ref Range   Glucose-Capillary 286 (H) 70 - 99 mg/dL    Comment: Glucose reference range applies only to samples taken after fasting for at least 8 hours.   Comment 1 Notify RN   Glucose, capillary     Status: Abnormal   Collection Time: 12/20/19  8:57 PM  Result Value Ref Range   Glucose-Capillary 254 (H) 70 - 99 mg/dL    Comment: Glucose reference range applies only to samples taken after fasting for at least 8 hours.  CBC     Status: None   Collection Time: 12/21/19 12:03 AM  Result Value Ref Range   WBC 9.2 4.0 - 10.5 K/uL   RBC 4.23 3.87 - 5.11 MIL/uL   Hemoglobin 12.8 12.0 - 15.0 g/dL   HCT 39.0 36 - 46 %   MCV 92.2 80.0 - 100.0 fL  MCH 30.3 26.0 - 34.0 pg   MCHC 32.8 30.0 - 36.0 g/dL   RDW 13.1 11.5 - 15.5 %   Platelets 266 150 - 400 K/uL   nRBC 0.0 0.0 - 0.2 %    Comment: Performed at [facility], [address] [place], [place] [address]  Basic metabolic panel     Status: Abnormal   Collection Time: 12/21/19 12:03 AM  Result Value Ref Range   Sodium 135 135 - 145 mmol/L   Potassium 4.9 3.5 - 5.1 mmol/L   Chloride 107 98 - 111 mmol/L   CO2 24 22 - 32  mmol/L   Glucose, Bld 216 (H) 70 - 99 mg/dL    Comment: Glucose reference range applies only to samples taken after fasting for at least 8 hours.   BUN 14 8 - 23 mg/dL   Creatinine, Ser 1.16 (H) 0.44 - 1.00 mg/dL   Calcium 8.2 (L) 8.9 - 10.3 mg/dL   GFR calc non Af Amer 48 (L) >60 mL/min   GFR calc Af Amer 56 (L) >60 mL/min   Anion gap 4 (L) 5 - 15    Comment: Performed at [facility], [address], [place], [place] [address]  Magnesium     Status: None   Collection Time: 12/21/19 12:03 AM  Result Value Ref Range   Magnesium 1.9 1.7 - 2.4 mg/dL    Comment: Performed at [facility], [address] [place], [place] [address]  Creatinine, serum     Status: Abnormal   Collection Time: 12/21/19  4:25 AM  Result Value Ref Range   Creatinine, Ser 1.12 (H) 0.44 - 1.00 mg/dL   GFR calc non Af Amer 50 (L) >60 mL/min   GFR calc Af Amer 58 (L) >60 mL/min    Comment: Performed at [facility], [address] [place], [place] [address]  Glucose, capillary     Status: Abnormal   Collection Time: 12/21/19  8:07 AM  Result Value Ref Range   Glucose-Capillary 168 (H) 70 - 99 mg/dL    Comment: Glucose reference range applies only to samples taken after fasting for at least 8 hours.  Glucose, capillary     Status: Abnormal   Collection Time: 12/21/19 11:35 AM  Result Value Ref Range   Glucose-Capillary 265 (H) 70 - 99 mg/dL    Comment: Glucose reference range applies only to samples taken after fasting for at least 8 hours.  Glucose, capillary     Status: Abnormal   Collection Time: 12/21/19  4:56 PM  Result Value Ref Range   Glucose-Capillary 110 (H) 70 - 99 mg/dL    Comment: Glucose reference range applies only to samples taken after fasting for at least 8 hours.  Glucose, capillary     Status: Abnormal   Collection Time: 12/21/19  9:11 PM  Result Value Ref Range   Glucose-Capillary 135 (H) 70 - 99 mg/dL    Comment: Glucose reference range applies only to  samples taken after fasting for at least 8 hours.   Comment 1 Notify RN   Renal function panel     Status: Abnormal   Collection Time: 12/22/19  7:14 AM  Result Value Ref Range   Sodium 140 135 - 145 mmol/L   Potassium 3.8 3.5 - 5.1 mmol/L   Chloride 113 (H) 98 - 111 mmol/L   CO2 23 22 - 32 mmol/L   Glucose, Bld 68 (L) 70 - 99 mg/dL  Comment: Glucose reference range applies only to samples taken after fasting for at least 8 hours.   BUN 11 8 - 23 mg/dL   Creatinine, Ser 0.85 0.44 - 1.00 mg/dL   Calcium 8.2 (L) 8.9 - 10.3 mg/dL   Phosphorus 2.1 (L) 2.5 - 4.6 mg/dL   Albumin 2.7 (L) 3.5 - 5.0 g/dL   GFR calc non Af Amer >60 >60 mL/min   GFR calc Af Amer >60 >60 mL/min   Anion gap 4 (L) 5 - 15    Comment: Performed at [facility], [address] [place], [place] [address]  Magnesium     Status: None   Collection Time: 12/22/19  7:14 AM  Result Value Ref Range   Magnesium 1.9 1.7 - 2.4 mg/dL    Comment: Performed at [facility], [address] [place],  [address]  Glucose, capillary     Status: Abnormal   Collection Time: 12/22/19  8:24 AM  Result Value Ref Range   Glucose-Capillary 57 (L) 70 - 99 mg/dL    Comment: Glucose reference range applies only to samples taken after fasting for at least 8 hours.   Comment 1 Notify RN   Glucose, capillary     Status: None   Collection Time: 12/22/19  8:48 AM  Result Value Ref Range   Glucose-Capillary 71 70 - 99 mg/dL    Comment: Glucose reference range applies only to samples taken after fasting for at least 8 hours.   Comment 1 Notify RN   SARS CORONAVIRUS 2 (TAT 6-24 HRS) Nasopharyngeal Nasopharyngeal Swab     Status: None   Collection Time: 12/22/19 11:29 AM   Specimen: Nasopharyngeal Swab  Result Value Ref Range   SARS Coronavirus 2 NEGATIVE NEGATIVE    Comment: (NOTE) SARS-CoV-2 target nucleic acids are NOT DETECTED. The SARS-CoV-2 RNA is generally detectable in upper and lower respiratory  specimens during the acute phase of infection. Negative results do not preclude SARS-CoV-2 infection, do not rule out co-infections with other pathogens, and should not be used as the sole basis for treatment or other patient management decisions. Negative results must be combined with clinical observations, patient history, and epidemiological information. The expected result is Negative. Fact Sheet for Patients: SugarRoll.be Fact Sheet for Healthcare Providers: https://www.woods-mathews.com/ This test is not yet approved or cleared by the [place] FDA and  has been authorized for detection and/or diagnosis of SARS-CoV-2 by FDA under an Emergency Use Authorization (EUA). This EUA will remain  in effect (meaning this test can be used) for the duration of the COVID-19 declaration under Section 56 4(b)(1) of the Act, 21 U.S.C. section 360bbb-3(b)(1), unless the authorization is terminated or revoked sooner. Performed at [facility], [place] [address] [place], [place] [address]   Glucose, capillary     Status: Abnormal   Collection Time: 12/22/19 12:11 PM  Result Value Ref Range   Glucose-Capillary 213 (H) 70 - 99 mg/dL    Comment: Glucose reference range applies only to samples taken after fasting for at least 8 hours.   Comment 1 Notify RN   Glucose, capillary     Status: Abnormal   Collection Time: 12/22/19  4:51 PM  Result Value Ref Range   Glucose-Capillary 124 (H) 70 - 99 mg/dL    Comment: Glucose reference range applies only to samples taken after fasting for at least 8 hours.   Comment 1 Notify RN   Glucose, capillary     Status: Abnormal  Collection Time: 12/22/19  8:43 PM  Result Value Ref Range   Glucose-Capillary 223 (H) 70 - 99 mg/dL    Comment: Glucose reference range applies only to samples taken after fasting for at least 8 hours.  Glucose, capillary     Status: Abnormal   Collection Time: 12/23/19  7:53 AM    Result Value Ref Range   Glucose-Capillary 136 (H) 70 - 99 mg/dL    Comment: Glucose reference range applies only to samples taken after fasting for at least 8 hours.  Glucose, capillary     Status: Abnormal   Collection Time: 12/23/19 12:06 PM  Result Value Ref Range   Glucose-Capillary 157 (H) 70 - 99 mg/dL    Comment: Glucose reference range applies only to samples taken after fasting for at least 8 hours.   Comment 1 Notify RN   Glucose, capillary     Status: Abnormal   Collection Time: 12/23/19  4:44 PM  Result Value Ref Range   Glucose-Capillary 181 (H) 70 - 99 mg/dL    Comment: Glucose reference range applies only to samples taken after fasting for at least 8 hours.   Comment 1 Notify RN   Glucose, capillary     Status: Abnormal   Collection Time: 12/23/19  9:23 PM  Result Value Ref Range   Glucose-Capillary 175 (H) 70 - 99 mg/dL    Comment: Glucose reference range applies only to samples taken after fasting for at least 8 hours.  Renal function panel     Status: Abnormal   Collection Time: 12/24/19  4:39 AM  Result Value Ref Range   Sodium 140 135 - 145 mmol/L   Potassium 3.6 3.5 - 5.1 mmol/L   Chloride 108 98 - 111 mmol/L   CO2 25 22 - 32 mmol/L   Glucose, Bld 111 (H) 70 - 99 mg/dL    Comment: Glucose reference range applies only to samples taken after fasting for at least 8 hours.   BUN 8 8 - 23 mg/dL   Creatinine, Ser 0.68 0.44 - 1.00 mg/dL   Calcium 8.8 (L) 8.9 - 10.3 mg/dL   Phosphorus 3.0 2.5 - 4.6 mg/dL   Albumin 2.7 (L) 3.5 - 5.0 g/dL   GFR calc non Af Amer >60 >60 mL/min   GFR calc Af Amer >60 >60 mL/min   Anion gap 7 5 - 15    Comment: Performed at [facility], [address] [place], [place] [address]  Magnesium     Status: Abnormal   Collection Time: 12/24/19  4:39 AM  Result Value Ref Range   Magnesium 1.6 (L) 1.7 - 2.4 mg/dL    Comment: Performed at [facility], [address] [place], [place] [address]  CBC     Status: None    Collection Time: 12/24/19  4:39 AM  Result Value Ref Range   WBC 7.3 4.0 - 10.5 K/uL   RBC 4.30 3.87 - 5.11 MIL/uL   Hemoglobin 13.1 12.0 - 15.0 g/dL   HCT 37.7 36 - 46 %   MCV 87.7 80.0 - 100.0 fL   MCH 30.5 26.0 - 34.0 pg   MCHC 34.7 30.0 - 36.0 g/dL   RDW 13.2 11.5 - 15.5 %   Platelets 271 150 - 400 K/uL   nRBC 0.0 0.0 - 0.2 %    Comment: Performed at [facility], [address] [place],  [address]  Glucose, capillary     Status: Abnormal   Collection  Time: 12/24/19  7:53 AM  Result Value Ref Range   Glucose-Capillary 68 (L) 70 - 99 mg/dL    Comment: Glucose reference range applies only to samples taken after fasting for at least 8 hours.  Glucose, capillary     Status: None   Collection Time: 12/24/19  9:52 AM  Result Value Ref Range   Glucose-Capillary 96 70 - 99 mg/dL    Comment: Glucose reference range applies only to samples taken after fasting for at least 8 hours.  Glucose, capillary     Status: Abnormal   Collection Time: 12/24/19 11:36 AM  Result Value Ref Range   Glucose-Capillary 145 (H) 70 - 99 mg/dL    Comment: Glucose reference range applies only to samples taken after fasting for at least 8 hours.  Glucose, capillary     Status: Abnormal   Collection Time: 12/24/19  5:26 PM  Result Value Ref Range   Glucose-Capillary 229 (H) 70 - 99 mg/dL    Comment: Glucose reference range applies only to samples taken after fasting for at least 8 hours.  CBC with Differential     Status: Abnormal   Collection Time: 01/15/20 11:24 PM  Result Value Ref Range   WBC 9.0 4.0 - 10.5 K/uL   RBC 5.12 (H) 3.87 - 5.11 MIL/uL   Hemoglobin 15.0 12.0 - 15.0 g/dL   HCT 46.0 36 - 46 %   MCV 89.8 80.0 - 100.0 fL   MCH 29.3 26.0 - 34.0 pg   MCHC 32.6 30.0 - 36.0 g/dL   RDW 13.2 11.5 - 15.5 %   Platelets 328 150 - 400 K/uL   nRBC 0.0 0.0 - 0.2 %   Neutrophils Relative % 59 %   Neutro Abs 5.4 1.7 - 7.7 K/uL   Lymphocytes Relative 29 %   Lymphs Abs 2.6 0.7 - 4.0  K/uL   Monocytes Relative 8 %   Monocytes Absolute 0.7 0 - 1 K/uL   Eosinophils Relative 3 %   Eosinophils Absolute 0.3 0 - 0 K/uL   Basophils Relative 0 %   Basophils Absolute 0.0 0 - 0 K/uL   Immature Granulocytes 1 %   Abs Immature Granulocytes 0.05 0.00 - 0.07 K/uL    Comment: Performed at [facility], [address] [place], [place] [address]  Comprehensive metabolic panel     Status: Abnormal   Collection Time: 01/15/20 11:24 PM  Result Value Ref Range   Sodium 134 (L) 135 - 145 mmol/L   Potassium 5.5 (H) 3.5 - 5.1 mmol/L    Comment: HEMOLYSIS AT THIS LEVEL MAY AFFECT RESULT   Chloride 100 98 - 111 mmol/L   CO2 24 22 - 32 mmol/L   Glucose, Bld 179 (H) 70 - 99 mg/dL    Comment: Glucose reference range applies only to samples taken after fasting for at least 8 hours.   BUN 30 (H) 8 - 23 mg/dL   Creatinine, Ser 1.44 (H) 0.44 - 1.00 mg/dL   Calcium 9.6 8.9 - 10.3 mg/dL   Total Protein 7.9 6.5 - 8.1 g/dL   Albumin 3.7 3.5 - 5.0 g/dL   AST 25 15 - 41 U/L   ALT 21 0 - 44 U/L   Alkaline Phosphatase 78 38 - 126 U/L   Total Bilirubin 0.6 0.3 - 1.2 mg/dL   GFR calc non Af Amer 37 (L) >60 mL/min   GFR calc Af Amer 43 (L) >60 mL/min   Anion gap 10 5 -  15    Comment: Performed at [facility], [place], [place] [address]  Troponin I (High Sensitivity)     Status: None   Collection Time: 01/15/20 11:24 PM  Result Value Ref Range   Troponin I (High Sensitivity) 8 <18 ng/L    Comment: (NOTE) Elevated high sensitivity troponin I (hsTnI) values and significant  changes across serial measurements may suggest ACS but many other  chronic and acute conditions are known to elevate hsTnI results.  Refer to the "Links" section for chest pain algorithms and additional  guidance. Performed at [facility], [address] [place], [place] [address]   Basic metabolic panel     Status: Abnormal   Collection Time: 01/18/20  4:54 PM  Result Value  Ref Range   Sodium 136 135 - 145 mmol/L   Potassium 5.4 (H) 3.5 - 5.1 mmol/L   Chloride 102 98 - 111 mmol/L   CO2 25 22 - 32 mmol/L   Glucose, Bld 296 (H) 70 - 99 mg/dL    Comment: Glucose reference range applies only to samples taken after fasting for at least 8 hours.   BUN 25 (H) 8 - 23 mg/dL   Creatinine, Ser 0.99 0.44 - 1.00 mg/dL   Calcium 9.6 8.9 - 10.3 mg/dL   GFR calc non Af Amer 58 (L) >60 mL/min   GFR calc Af Amer >60 >60 mL/min   Anion gap 9 5 - 15    Comment: Performed at [facility], [address] [place], [place] [address]  CBC     Status: None   Collection Time: 01/18/20  4:54 PM  Result Value Ref Range   WBC 7.6 4.0 - 10.5 K/uL   RBC 4.74 3.87 - 5.11 MIL/uL   Hemoglobin 14.3 12.0 - 15.0 g/dL   HCT 41.3 36 - 46 %   MCV 87.1 80.0 - 100.0 fL   MCH 30.2 26.0 - 34.0 pg   MCHC 34.6 30.0 - 36.0 g/dL   RDW 13.2 11.5 - 15.5 %   Platelets 309 150 - 400 K/uL   nRBC 0.0 0.0 - 0.2 %    Comment: Performed at [facility], [place], [place] [address]  Troponin I (High Sensitivity)     Status: None   Collection Time: 01/18/20  4:54 PM  Result Value Ref Range   Troponin I (High Sensitivity) 6 <18 ng/L    Comment: (NOTE) Elevated high sensitivity troponin I (hsTnI) values and significant  changes across serial measurements may suggest ACS but many other  chronic and acute conditions are known to elevate hsTnI results.  Refer to the "Links" section for chest pain algorithms and additional  guidance. Performed at [facility], [address] [place], [place] [address]     Assessment/Plan:  Benign essential HTN blood pressure control important in reducing the progression of atherosclerotic disease. On appropriate oral medications.   DM (diabetes mellitus) type II controlled, neurological manifestation (HCC) blood glucose control important in reducing the progression of atherosclerotic disease. Also, involved in wound  healing. On appropriate medications.   Hyperlipidemia lipid control important in reducing the progression of atherosclerotic disease. 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  Foot is warm well perfused currently.  Will be bring her back in the next couple of months for noninvasive studies.  [name] 02/24/2020, 3:09 PM   This note was created with Dragon medical transcription system.  Any errors from dictation are unintentional.

## 2020-02-24 NOTE — Assessment & Plan Note (Signed)
blood pressure control important in reducing the progression of atherosclerotic disease. On appropriate oral medications.  

## 2020-02-24 NOTE — Assessment & Plan Note (Signed)
blood glucose control important in reducing the progression of atherosclerotic disease. Also, involved in wound healing. On appropriate medications.  

## 2020-02-24 NOTE — Assessment & Plan Note (Signed)
lipid control important in reducing the progression of atherosclerotic disease. Continue statin therapy  

## 2020-02-24 NOTE — Assessment & Plan Note (Signed)
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  Foot is warm well perfused currently.  Will be bring her back in the next couple of months for noninvasive studies.

## 2020-04-09 ENCOUNTER — Emergency Department
Admission: EM | Admit: 2020-04-09 | Discharge: 2020-04-09 | Disposition: A | Discharge status: Other Acute Inpatient Hospital | Payer: Medicare PPO

## 2020-04-27 ENCOUNTER — Ambulatory Visit (INDEPENDENT_AMBULATORY_CARE_PROVIDER_SITE_OTHER): Payer: Medicare PPO | Admitting: Vascular Surgery

## 2020-04-27 ENCOUNTER — Encounter (INDEPENDENT_AMBULATORY_CARE_PROVIDER_SITE_OTHER): Payer: Medicare PPO

## 2020-09-03 ENCOUNTER — Other Ambulatory Visit: Payer: Self-pay | Admitting: Nurse Practitioner

## 2020-09-03 ENCOUNTER — Ambulatory Visit (HOSPITAL_COMMUNITY)
Admission: RE | Admit: 2020-09-03 | Discharge: 2020-09-03 | Disposition: A | Discharge status: Home / Self Care | Payer: Medicare PPO | Source: Ambulatory Visit | Attending: Pulmonary Disease | Admitting: Pulmonary Disease

## 2020-09-03 DIAGNOSIS — U071 COVID-19: Secondary | ICD-10-CM

## 2020-09-03 DIAGNOSIS — E1140 Type 2 diabetes mellitus with diabetic neuropathy, unspecified: Secondary | ICD-10-CM

## 2020-09-03 DIAGNOSIS — Z794 Long term (current) use of insulin: Secondary | ICD-10-CM | POA: Insufficient documentation

## 2020-09-03 MED ORDER — EPINEPHRINE 0.3 MG/0.3ML IJ SOAJ: 0.3000 mg | Freq: Once | INTRAMUSCULAR | Status: DC | PRN

## 2020-09-03 MED ORDER — ALBUTEROL SULFATE HFA 108 (90 BASE) MCG/ACT IN AERS: 2.0000 | INHALATION_SPRAY | Freq: Once | RESPIRATORY_TRACT | Status: DC | PRN

## 2020-09-03 MED ORDER — METHYLPREDNISOLONE SODIUM SUCC 125 MG IJ SOLR: 125.0000 mg | Freq: Once | INTRAMUSCULAR | Status: DC | PRN

## 2020-09-03 MED ORDER — DIPHENHYDRAMINE HCL 50 MG/ML IJ SOLN: 50.0000 mg | Freq: Once | INTRAMUSCULAR | Status: DC | PRN

## 2020-09-03 MED ORDER — FAMOTIDINE IN NACL 20-0.9 MG/50ML-% IV SOLN: 20.0000 mg | Freq: Once | INTRAVENOUS | Status: DC | PRN

## 2020-09-03 MED ORDER — SODIUM CHLORIDE 0.9 % IV SOLN: INTRAVENOUS | Status: DC | PRN

## 2020-09-03 MED ORDER — SOTROVIMAB 500 MG/8ML IV SOLN
500.0000 mg | Freq: Once | INTRAVENOUS | Status: AC
  Administered 2020-09-03: 500 mg via INTRAVENOUS

## 2020-09-03 NOTE — Progress Notes (Signed)
I connected by phone with Judith Shaw on 09/03/2020 at 2:56 PM to discuss the potential use of a new treatment for mild to moderate COVID-19 viral infection in non-hospitalized patients.  This patient is a 70 y.o. female that meets the FDA criteria for Emergency Use Authorization of COVID monoclonal antibody sotrovimab.  Has a (+) direct SARS-CoV-2 viral test result  Has mild or moderate COVID-19   Is NOT hospitalized due to COVID-19  Is within 10 days of symptom onset  Has at least one of the high risk factor(s) for progression to severe COVID-19 and/or hospitalization as defined in EUA.  Specific high risk criteria : Diabetes   I have spoken and communicated the following to the patient or parent/caregiver regarding COVID monoclonal antibody treatment:  1. FDA has authorized the emergency use for the treatment of mild to moderate COVID-19 in adults and pediatric patients with positive results of direct SARS-CoV-2 viral testing who are 63 years of age and older weighing at least 40 kg, and who are at high risk for progressing to severe COVID-19 and/or hospitalization.  2. The significant known and potential risks and benefits of COVID monoclonal antibody, and the extent to which such potential risks and benefits are unknown.  3. Information on available alternative treatments and the risks and benefits of those alternatives, including clinical trials.  4. Patients treated with COVID monoclonal antibody should continue to self-isolate and use infection control measures (e.g., wear mask, isolate, social distance, avoid sharing personal items, clean and disinfect "high touch" surfaces, and frequent handwashing) according to CDC guidelines.   5. The patient or parent/caregiver has the option to accept or refuse COVID monoclonal antibody treatment.  After reviewing this information with the patient, the patient has agreed to receive one of the available covid 19 monoclonal antibodies and will  be provided an appropriate fact sheet prior to infusion. Fenton Foy, NP 09/03/2020 2:56 PM

## 2020-09-03 NOTE — Progress Notes (Signed)
Diagnosis: COVID-19  Physician: Dr. Patrick Wright  Procedure: [facility] Infusion [facility] Med: Sotrovimab infusion - Provided patient with sotrovimab fact sheet for patients, parents, and caregivers prior to infusion.   Complications: No immediate complications noted  Discharge: Discharged home    

## 2020-09-03 NOTE — Discharge Instructions (Signed)
10 Things You Can Do to Manage Your COVID-19 Symptoms at Home If you have possible or confirmed COVID-19: 1. Stay home from work and school. And stay away from other public places. If you must go out, avoid using any kind of public transportation, ridesharing, or taxis. 2. Monitor your symptoms carefully. If your symptoms get worse, call your healthcare provider immediately. 3. Get rest and stay hydrated. 4. If you have a medical appointment, call the healthcare provider ahead of time and tell them that you have or may have COVID-19. 5. For medical emergencies, call 911 and notify the dispatch personnel that you have or may have COVID-19. 6. Cover your cough and sneezes with a tissue or use the inside of your elbow. 7. Wash your hands often with soap and water for at least 20 seconds or clean your hands with an alcohol-based hand sanitizer that contains at least 60% alcohol. 8. As much as possible, stay in a specific room and away from other people in your home. Also, you should use a separate bathroom, if available. If you need to be around other people in or outside of the home, wear a mask. 9. Avoid sharing personal items with other people in your household, like dishes, towels, and bedding. 10. Clean all surfaces that are touched often, like counters, tabletops, and doorknobs. Use household cleaning sprays or wipes according to the label instructions. cdc.gov/coronavirus 02/26/2019 This information is not intended to replace advice given to you by your health care provider. Make sure you discuss any questions you have with your health care provider. Document Revised: 07/31/2019 Document Reviewed: 07/31/2019 Elsevier Patient Education  2020 Elsevier Inc. What types of side effects do monoclonal antibody drugs cause?  Common side effects  In general, the more common side effects caused by monoclonal antibody drugs include: . Allergic reactions, such as hives or itching . Flu-like signs and  symptoms, including chills, fatigue, fever, and muscle aches and pains . Nausea, vomiting . Diarrhea . Skin rashes . Low blood pressure   The CDC is recommending patients who receive monoclonal antibody treatments wait at least 90 days before being vaccinated.  Currently, there are no data on the safety and efficacy of mRNA COVID-19 vaccines in persons who received monoclonal antibodies or convalescent plasma as part of COVID-19 treatment. Based on the estimated half-life of such therapies as well as evidence suggesting that reinfection is uncommon in the 90 days after initial infection, vaccination should be deferred for at least 90 days, as a precautionary measure until additional information becomes available, to avoid interference of the antibody treatment with vaccine-induced immune responses. If you have any questions or concerns after the infusion please call the Advanced Practice Provider on call at [phone]. This number is ONLY intended for your use regarding questions or concerns about the infusion post-treatment side-effects.  Please do not provide this number to others for use. For return to work notes please contact your primary care provider.   If someone you know is interested in receiving treatment please have them call the COVID hotline at [phone].   

## 2020-09-03 NOTE — Progress Notes (Signed)
Patient reviewed Fact Sheet for Patients, Parents, and Caregivers for Emergency Use Authorization (EUA) of Sotrovimab for the Treatment of Coronavirus. Patient also reviewed and is agreeable to the estimated cost of treatment. Patient is agreeable to proceed.   

## 2021-04-30 ENCOUNTER — Other Ambulatory Visit: Payer: Self-pay

## 2021-04-30 ENCOUNTER — Emergency Department: Payer: Medicare PPO

## 2021-04-30 ENCOUNTER — Emergency Department
Admission: EM | Admit: 2021-04-30 | Discharge: 2021-04-30 | Disposition: A | Discharge status: Home / Self Care | Payer: Medicare PPO | Attending: Emergency Medicine | Admitting: Emergency Medicine

## 2021-04-30 DIAGNOSIS — Z79899 Other long term (current) drug therapy: Secondary | ICD-10-CM | POA: Insufficient documentation

## 2021-04-30 DIAGNOSIS — M545 Low back pain, unspecified: Secondary | ICD-10-CM | POA: Diagnosis not present

## 2021-04-30 DIAGNOSIS — I110 Hypertensive heart disease with heart failure: Secondary | ICD-10-CM | POA: Diagnosis not present

## 2021-04-30 DIAGNOSIS — Z7984 Long term (current) use of oral hypoglycemic drugs: Secondary | ICD-10-CM | POA: Diagnosis not present

## 2021-04-30 DIAGNOSIS — S50312A Abrasion of left elbow, initial encounter: Secondary | ICD-10-CM | POA: Diagnosis not present

## 2021-04-30 DIAGNOSIS — R55 Syncope and collapse: Secondary | ICD-10-CM | POA: Insufficient documentation

## 2021-04-30 DIAGNOSIS — I5032 Chronic diastolic (congestive) heart failure: Secondary | ICD-10-CM | POA: Insufficient documentation

## 2021-04-30 DIAGNOSIS — Z7982 Long term (current) use of aspirin: Secondary | ICD-10-CM | POA: Diagnosis not present

## 2021-04-30 DIAGNOSIS — Z8542 Personal history of malignant neoplasm of other parts of uterus: Secondary | ICD-10-CM | POA: Diagnosis not present

## 2021-04-30 DIAGNOSIS — W19XXXA Unspecified fall, initial encounter: Secondary | ICD-10-CM

## 2021-04-30 DIAGNOSIS — Z794 Long term (current) use of insulin: Secondary | ICD-10-CM | POA: Diagnosis not present

## 2021-04-30 DIAGNOSIS — S59902A Unspecified injury of left elbow, initial encounter: Secondary | ICD-10-CM | POA: Diagnosis present

## 2021-04-30 DIAGNOSIS — W1830XA Fall on same level, unspecified, initial encounter: Secondary | ICD-10-CM | POA: Diagnosis not present

## 2021-04-30 DIAGNOSIS — E119 Type 2 diabetes mellitus without complications: Secondary | ICD-10-CM | POA: Diagnosis not present

## 2021-04-30 LAB — CBC WITH DIFFERENTIAL/PLATELET
Abs Immature Granulocytes: 0.03 10*3/uL (ref 0.00–0.07)
Basophils Absolute: 0 10*3/uL (ref 0.0–0.1)
Basophils Relative: 0 %
Eosinophils Absolute: 0.7 10*3/uL — ABNORMAL HIGH (ref 0.0–0.5)
Eosinophils Relative: 8 %
HCT: 43.1 % (ref 36.0–46.0)
Hemoglobin: 14.4 g/dL (ref 12.0–15.0)
Immature Granulocytes: 0 %
Lymphocytes Relative: 29 %
Lymphs Abs: 2.6 10*3/uL (ref 0.7–4.0)
MCH: 30.6 pg (ref 26.0–34.0)
MCHC: 33.4 g/dL (ref 30.0–36.0)
MCV: 91.7 fL (ref 80.0–100.0)
Monocytes Absolute: 0.7 10*3/uL (ref 0.1–1.0)
Monocytes Relative: 7 %
Neutro Abs: 5 10*3/uL (ref 1.7–7.7)
Neutrophils Relative %: 56 %
Platelets: 253 10*3/uL (ref 150–400)
RBC: 4.7 MIL/uL (ref 3.87–5.11)
RDW: 13.5 % (ref 11.5–15.5)
WBC: 9 10*3/uL (ref 4.0–10.5)
nRBC: 0 % (ref 0.0–0.2)

## 2021-04-30 LAB — COMPREHENSIVE METABOLIC PANEL
ALT: 21 U/L (ref 0–44)
AST: 29 U/L (ref 15–41)
Albumin: 3.2 g/dL — ABNORMAL LOW (ref 3.5–5.0)
Alkaline Phosphatase: 73 U/L (ref 38–126)
Anion gap: 6 (ref 5–15)
BUN: 31 mg/dL — ABNORMAL HIGH (ref 8–23)
CO2: 25 mmol/L (ref 22–32)
Calcium: 8.7 mg/dL — ABNORMAL LOW (ref 8.9–10.3)
Chloride: 105 mmol/L (ref 98–111)
Creatinine, Ser: 1.07 mg/dL — ABNORMAL HIGH (ref 0.44–1.00)
GFR, Estimated: 56 mL/min — ABNORMAL LOW (ref >60)
Glucose, Bld: 284 mg/dL — ABNORMAL HIGH (ref 70–99)
Potassium: 5.5 mmol/L — ABNORMAL HIGH (ref 3.5–5.1)
Sodium: 136 mmol/L (ref 135–145)
Total Bilirubin: 0.8 mg/dL (ref 0.3–1.2)
Total Protein: 7.5 g/dL (ref 6.5–8.1)

## 2021-04-30 IMAGING — CT CT L SPINE W/O CM
3 of 4 series · 15 of 34 positions shown, 17 images · non-contrast
Comparison: CT [DATE].

CLINICAL DATA: fall with lumbar and left paraspinal pain/TTP. eval
fx

EXAM:
CT LUMBAR SPINE WITHOUT CONTRAST
TECHNIQUE: Multidetector CT imaging of the lumbar spine was performed without
intravenous contrast administration. Multiplanar CT image
reconstructions were also generated.

[Series 4: l spine soft · axial · 0.40mm/px · z∈[-706,-522]mm · 6 of 121 slices shown, 8 images]
[im 19/121  soft-tissue]
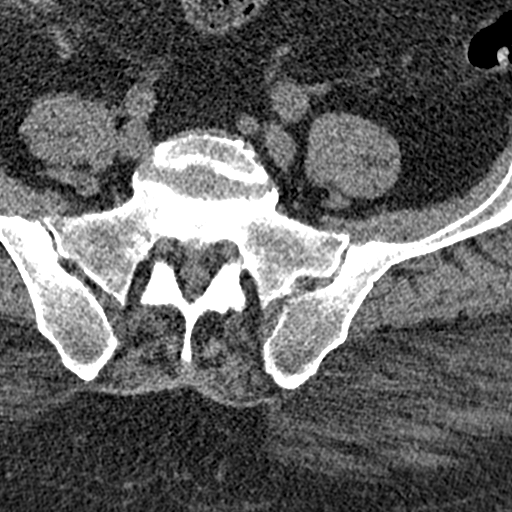
[im 19/121  bone]
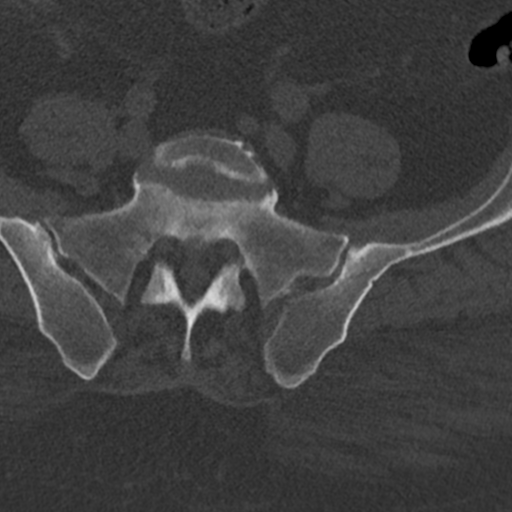
[im 37/121  bone]
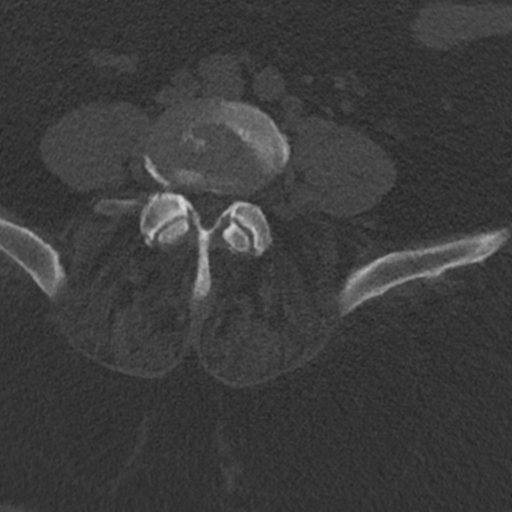
[im 56/121  bone]
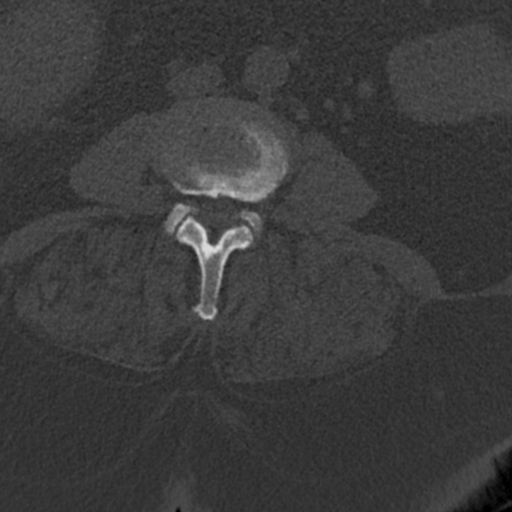
[im 74/121  bone]
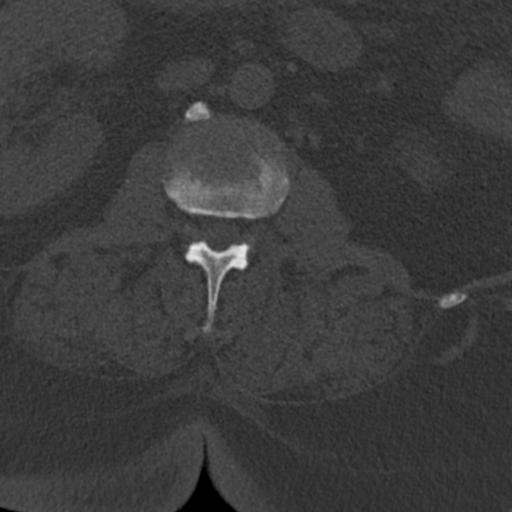
[im 93/121  soft-tissue]
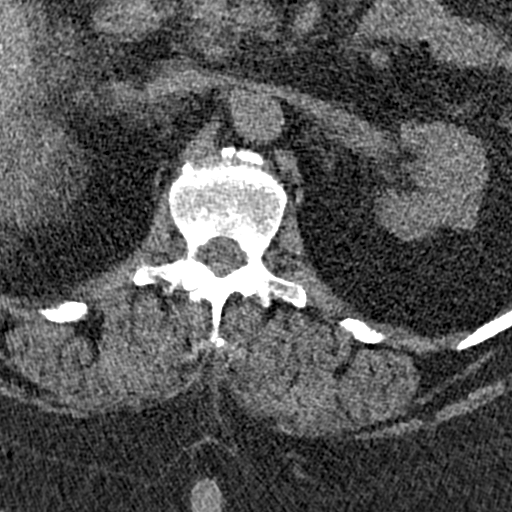
[im 93/121  bone]
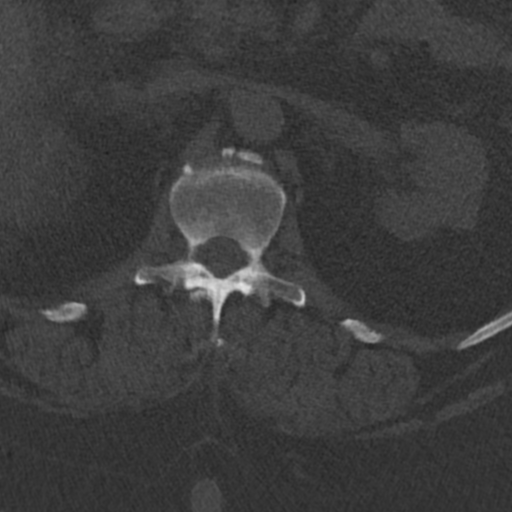
[im 111/121  bone]
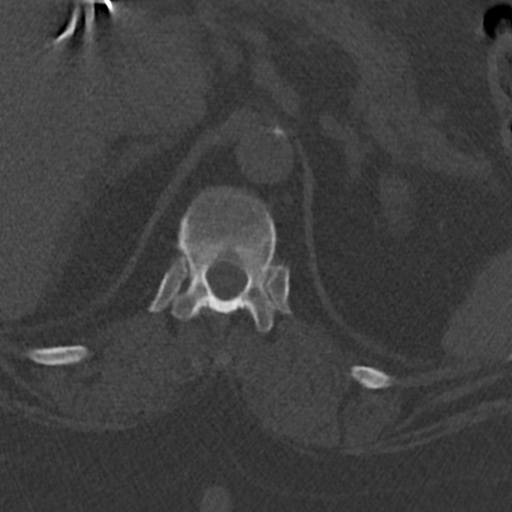

[Series 6: sagittal st · sagittal · 0.45mm/px · 6 of 110 slices shown]
[im 19/110  bone]
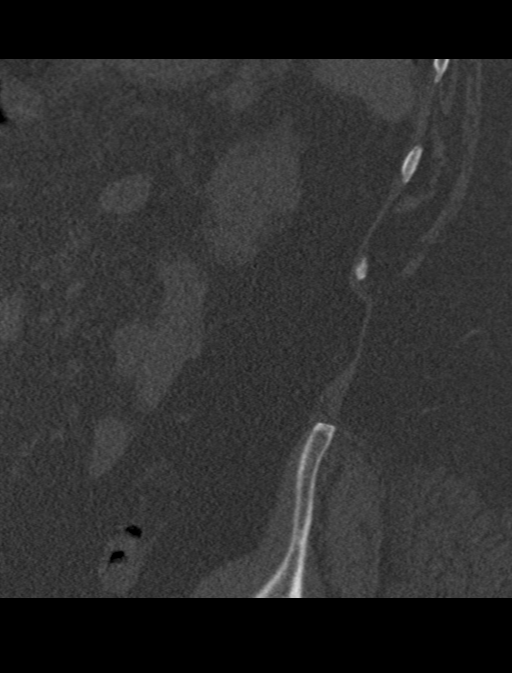
[im 37/110  bone]
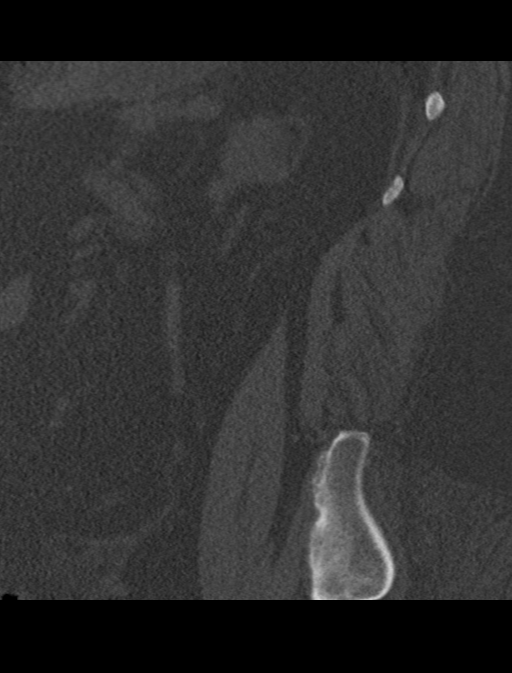
[im 55/110  bone]
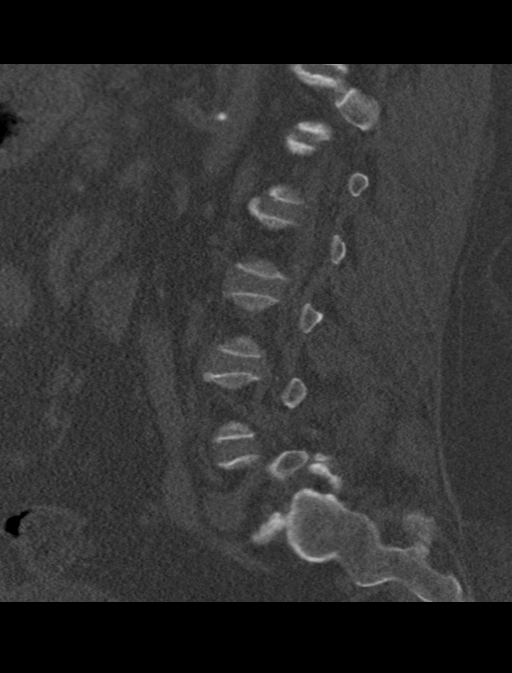
[im 58/110  soft-tissue]
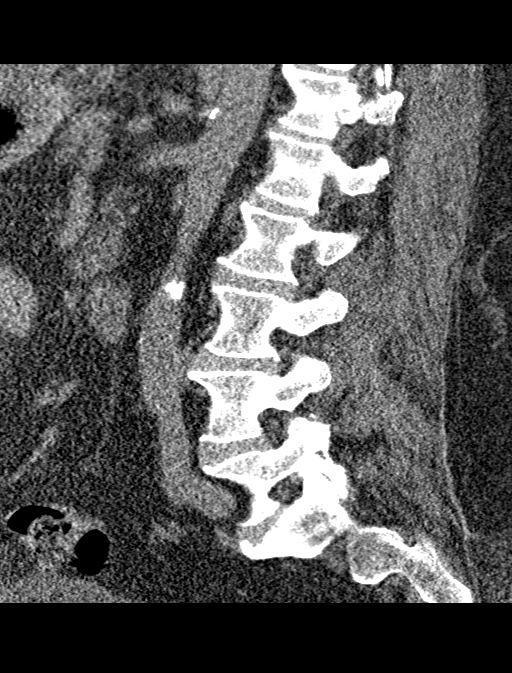
[im 73/110  bone]
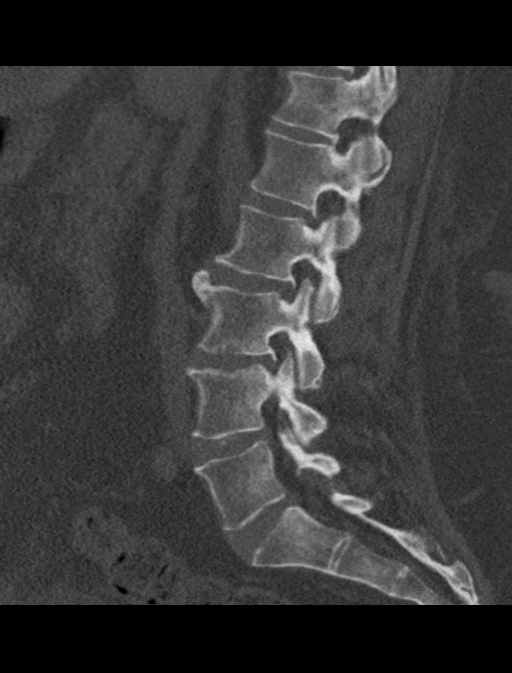
[im 91/110  bone]
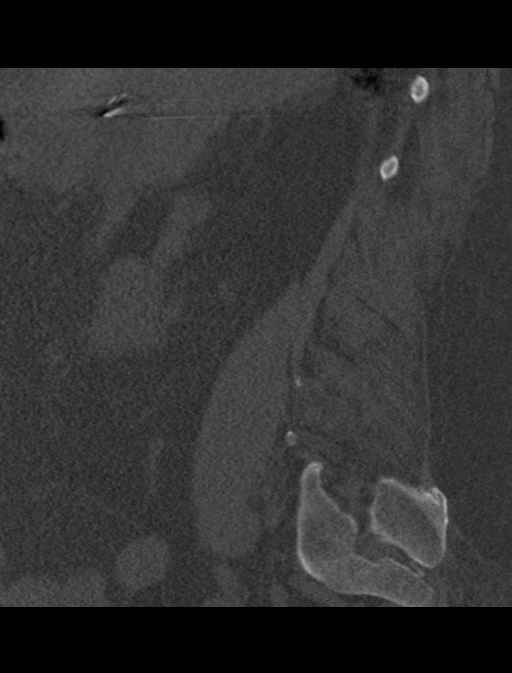

[Series 7: coronal bone · coronal · 0.48mm/px · 3 of 119 slices shown]
[im 24/119  bone]
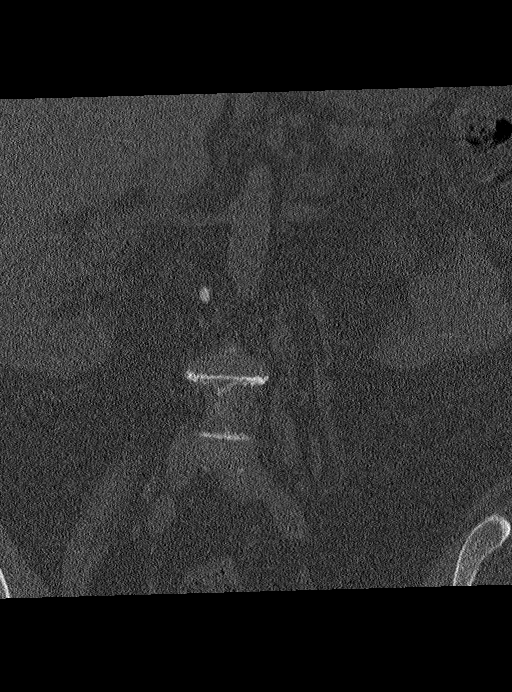
[im 48/119  bone]
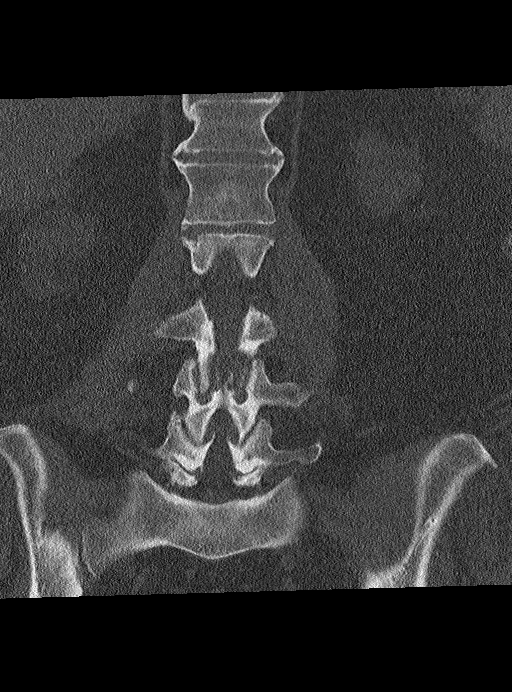
[im 71/119  bone]
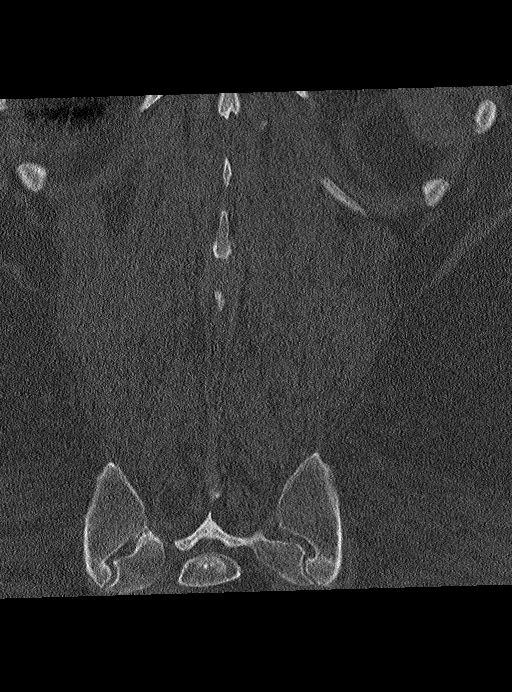

[15 of 34 positions shown; findings below may reference images not displayed]

FINDINGS: Segmentation: 5 lumbar type vertebrae.

Alignment: Trace retrolisthesis at L3-L4.

Vertebrae: No acute lumbar spine fracture. No aggressive osseous
lesion.

Paraspinal and other soft tissues: Atrophic left kidney.

Disc levels: Mild multilevel degenerative disc disease and bilateral
facet arthropathy. Multilevel posterior disc osteophyte complexes.
No visible impingement on noncontrast CT.
IMPRESSION: No acute lumbar spine fracture.

Mild multilevel degenerative disc disease and facet arthropathy.

## 2021-04-30 IMAGING — CR DG CHEST 2V
1 series · 2 of 2 positions shown · non-contrast
Comparison: Chest radiograph [DATE].

CLINICAL DATA: Patient status post fall.

EXAM:
CHEST - 2 VIEW

[Series 1: dg chest 2 view · 0.14mm/px · 2 of 2 slices shown]
[im 1/2]
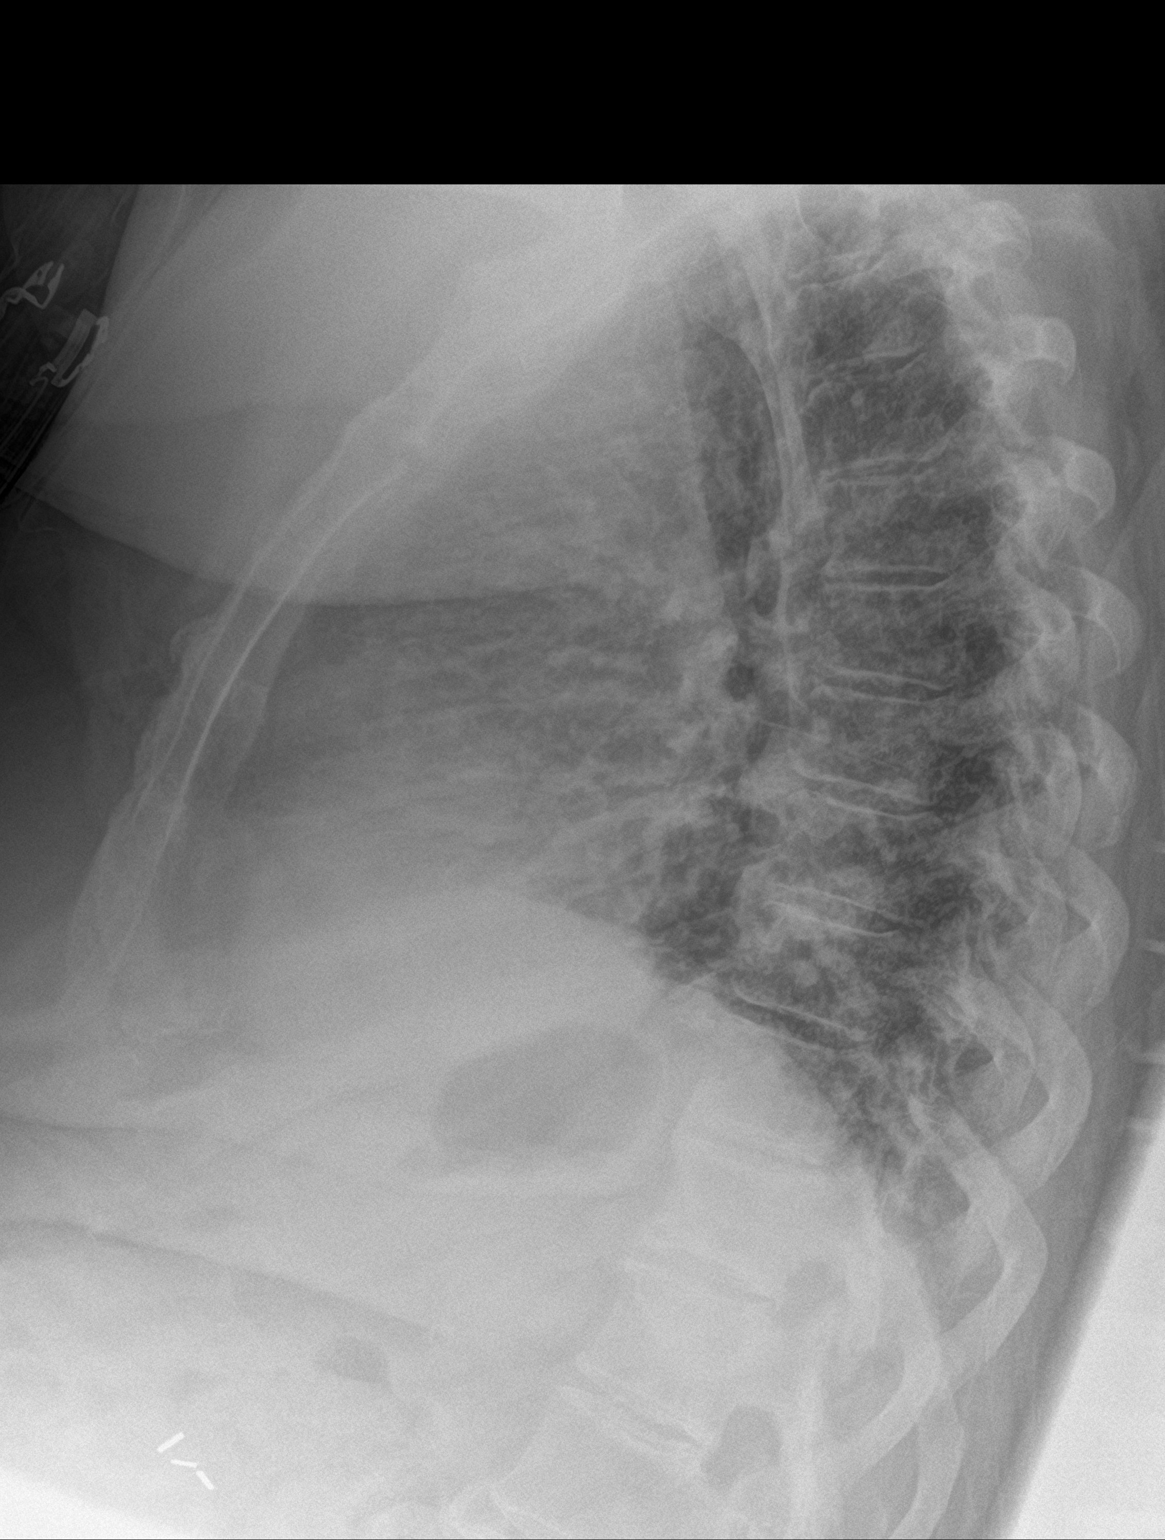
[im 2/2]
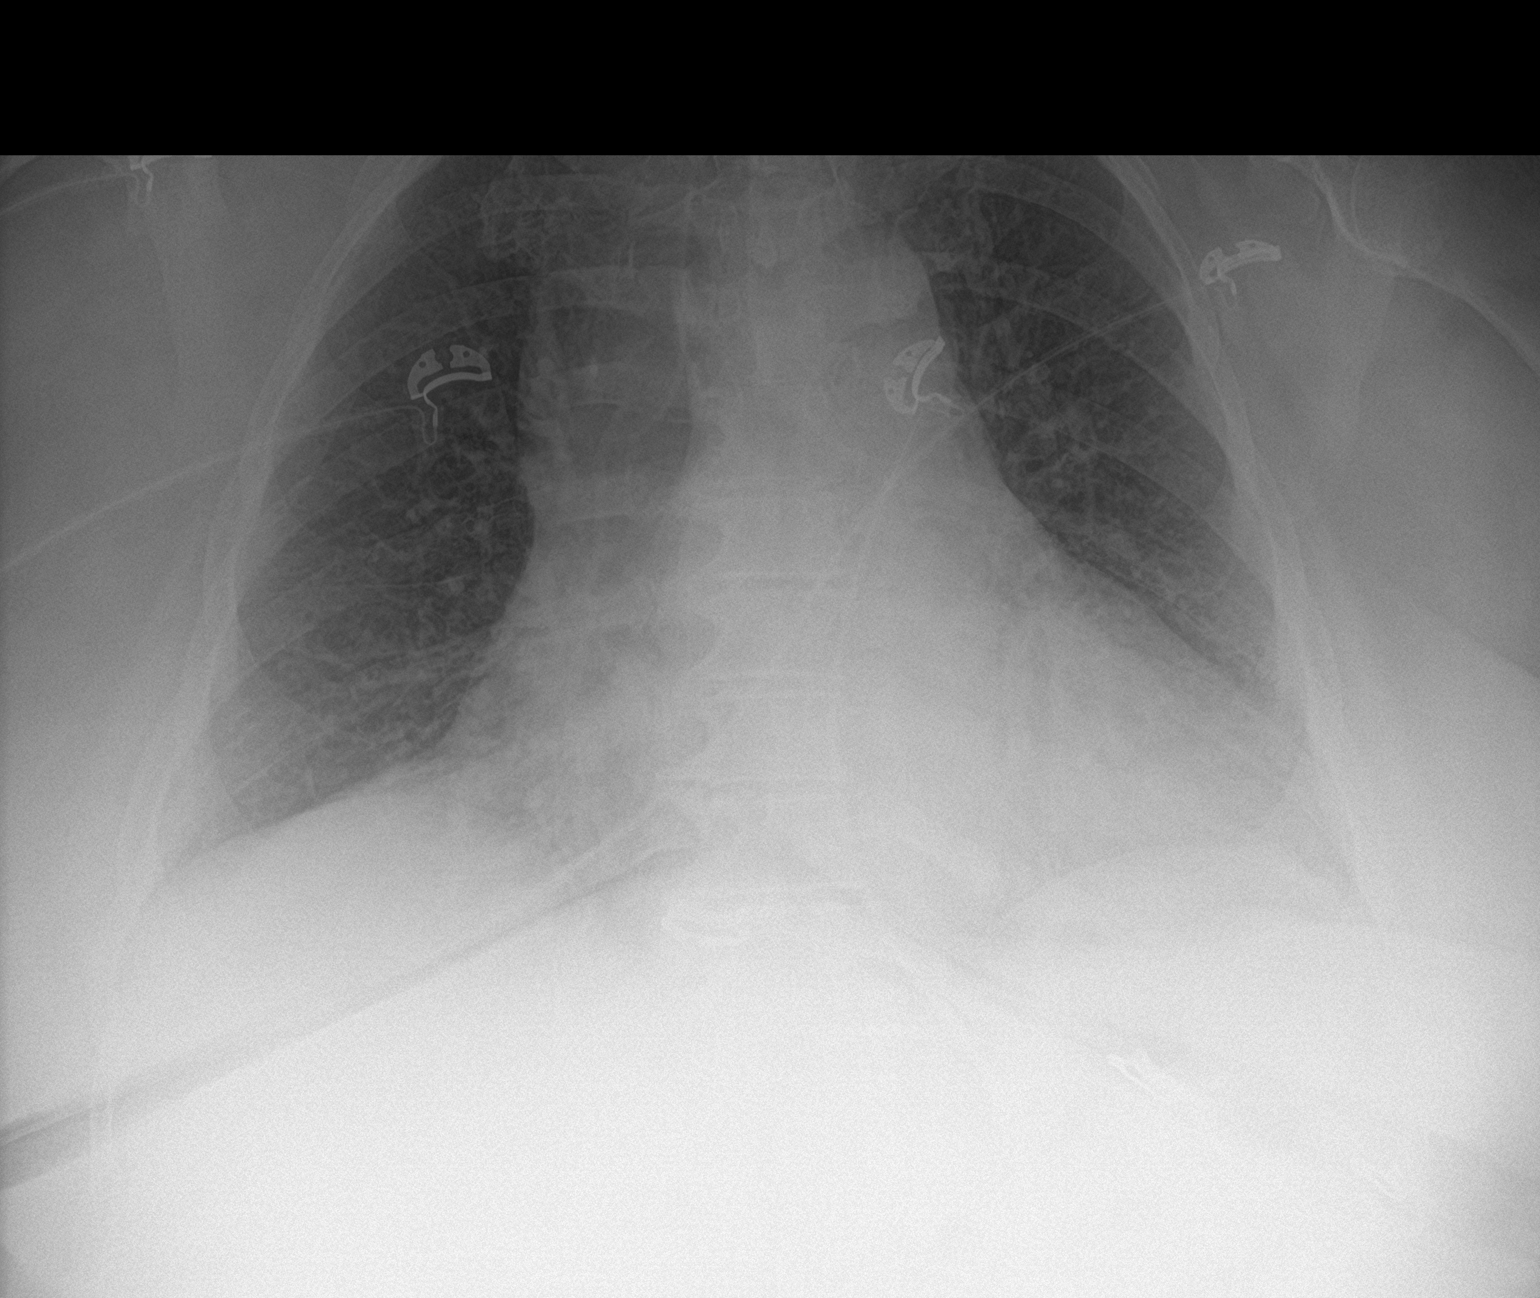

[2 of 2 positions shown; findings below may reference images not displayed]

FINDINGS: Monitoring leads overlie the patient. Stable enlarged cardiac and
mediastinal contours. Low lung volumes. Bibasilar atelectasis. No
large area pulmonary consolidation. No pleural effusion or
pneumothorax. Thoracic spine degenerative changes.
IMPRESSION: No acute cardiopulmonary process.

## 2021-04-30 MED ORDER — ACETAMINOPHEN 500 MG PO TABS
1000.0000 mg | ORAL_TABLET | Freq: Once | ORAL | Status: AC
  Administered 2021-04-30: 1000 mg via ORAL
  Filled 2021-04-30: qty 2

## 2021-04-30 NOTE — ED Provider Notes (Signed)
[facility] Emergency Department Provider Note ____________________________________________   Event Date/Time   First MD Initiated Contact with Patient 04/30/21 [phone]     (approximate)  I have reviewed the triage vital signs and the nursing notes.  HISTORY  Chief Complaint Fall   HPI Judith Shaw is a 70 y.o. femalewho presents to the ED for evaluation of fall and possible syncope.   Chart review indicates HTN, HLD, DM and morbid obesity.  Patient resides at home with her brother, she ambulates independently or with a walker during long distances.  She reports getting up this morning to void when she fell to the ground and "does not know what happened."  She reports that she may have passed out.  She reports pain to her left lumbar back, 6/10 intensity, and improving from when she first fell.   She denies any recent illnesses and reports that she was fine yesterday.  She reports an ulcerative lesion or a sore to her frontal abdomen, just to the right of her umbilicus, that has been there for a few weeks. Denies fevers, additional falls or syncopal episodes, chest pain, shortness of breath.  Denies abdominal pain.  Past Medical History:  Diagnosis Date   Anxiety    Cancer ([place])    pt states hx of uterine cancer and had a complete hyst   CHF (congestive heart failure) ([place])    Depression    Diabetes mellitus without complication ([place])    Hyperlipidemia    Hypertension     Patient Active Problem List   Diagnosis Date Noted   Atherosclerosis of native arteries of the extremities with ulceration ([place]) 02/24/2020   S/P amputation of lesser toe, left ([place]) 01/13/2020   Dementia without behavioral disturbance ([place]) 12/16/2019   Acute osteomyelitis of left foot ([place]) 12/15/2019   Memory impairment 11/30/2019   Complaints of memory disturbance 11/20/2019   Diabetic ulcer of left midfoot associated with type 2 diabetes mellitus ([place]) 11/18/2019    Seizure-like activity ([place]) 07/15/2019   Intractable 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 06/19/2019   Uncontrolled type 2 diabetes mellitus with hyperglycemia, with long-term current use of insulin ([place]) 04/22/2019   Panic attacks 03/26/2019   Benign essential HTN 07/19/2018   Fatty liver 05/15/2018   Hx of adenomatous colonic polyps 05/15/2018   Type 2 diabetes mellitus with diabetic nephropathy, with long-term current use of insulin ([place]) 05/01/2018   Tinnitus, bilateral 04/05/2017   Concussion syndrome 04/03/2017   Concussion without loss of consciousness 01/24/2017   Difficulty walking 01/24/2017   Headache disorder 01/24/2017   Numbness and tingling 01/24/2017   Postural urinary incontinence 01/24/2017   Sepsis ([place]) 09/21/2016   UTI (urinary tract infection) 09/21/2016   HTN (hypertension) 09/21/2016   Diabetes ([place]) 09/21/2016   Depression 09/21/2016   Health care maintenance 10/05/2015   Recurrent major depressive disorder, in full remission ([place]) 06/16/2014   DM (diabetes mellitus) type II controlled, neurological manifestation ([place]) 06/12/2014   Morbid obesity ([place]) 06/12/2014   Hyperlipidemia 03/10/2014   Chronic diastolic CHF (congestive heart failure) ([place]) 123XX123   H/O diastolic dysfunction 123XX123   Sleep apnea 03/10/2014   SOB (shortness of breath) 03/10/2014    Past Surgical History:  Procedure Laterality Date   ABDOMINAL HYSTERECTOMY     AMPUTATION Left 12/19/2019   Procedure: AMPUTATION RAY Left 5th;  Surgeon: Caroline More, DPM;  Location: [facility] ORS;  Service: Podiatry;  Laterality: Left;   CHOLECYSTECTOMY  LOWER EXTREMITY ANGIOGRAPHY Left 12/17/2019   Procedure: Lower Extremity Angiography;  Surgeon: Algernon Huxley, MD;  Location: [facility];  Service: Cardiovascular;  Laterality: Left;    Prior to Admission medications   Medication Sig Start Date End Date Taking? Authorizing Provider  acetaminophen (TYLENOL) 500  MG tablet Take 2 tablets (1,000 mg total) by mouth every 8 (eight) hours. 12/22/19   Mercy Riding, MD  amoxicillin-clavulanate (AUGMENTIN) 875-125 MG tablet Take 1 tablet by mouth 2 (two) times daily. 12/24/19   Jennye Boroughs, MD  aspirin EC 81 MG tablet Take 81 mg by mouth daily.    [provider]  Baclofen 5 MG TABS Take 5 mg by mouth at bedtime. 06/12/19   Laban Emperor, PA-C  clotrimazole-betamethasone (LOTRISONE) cream Apply 1 application topically 2 (two) times daily.    [provider]  donepezil (ARICEPT) 5 MG tablet Take 5 mg by mouth daily. 11/25/19   [provider]  DULoxetine (CYMBALTA) 60 MG capsule  12/24/19   [provider]  DULoxetine HCl 60 MG CSDR Take 60 mg by mouth daily.     [provider]  enoxaparin (LOVENOX) 40 MG/0.4ML injection Inject 0.4 mLs (40 mg total) into the skin daily for 17 days. 12/24/19 01/10/20  Jennye Boroughs, MD  furosemide (LASIX) 20 MG tablet Take 20 mg by mouth every other day.  11/29/16   [provider]  gentian violet 2 % topical solution Apply 0.5 mLs topically in the morning and at bedtime. Apply between the toes twice daily. 10/20/19   [provider]  insulin aspart (NOVOLOG) 100 UNIT/ML injection Inject 0-15 Units into the skin 3 (three) times daily with meals. CBG 70 - 120: 0 units CBG 121 - 150: 2 units CBG 151 - 200: 3 units CBG 201 - 250: 5 units CBG 251 - 300: 8 units CBG 301 - 350: 11 units CBG 351 - 400: 15 units CBG > 400: call MD and obtain STAT lab verification 12/22/19   Mercy Riding, MD  insulin aspart (NOVOLOG) 100 UNIT/ML injection Inject 6 Units into the skin 3 (three) times daily with meals. 12/22/19   Mercy Riding, MD  insulin glargine (LANTUS) 100 UNIT/ML injection Inject 0.6 mLs (60 Units total) into the skin 2 (two) times daily. 12/22/19   Mercy Riding, MD  ketoconazole (NIZORAL) 2 % cream Apply 1 application topically in the morning and at bedtime. 09/24/19    [provider]  metFORMIN (GLUCOPHAGE) 500 MG tablet  12/24/19   [provider]  metFORMIN (GLUCOPHAGE-XR) 500 MG 24 hr tablet Take 500 mg by mouth daily. With dinner.    [provider]  mirabegron ER (MYRBETRIQ) 25 MG TB24 tablet Take 1 tablet (25 mg total) by mouth daily. 07/01/19   Zara Council A, PA-C  potassium chloride (K-DUR,KLOR-CON) 10 MEQ tablet Take 10 mEq by mouth daily.    [provider]  potassium chloride (KLOR-CON) 10 MEQ tablet  02/15/20   [provider]  QUEtiapine (SEROQUEL) 25 MG tablet Take by mouth. 01/27/20   [provider]  ramipril (ALTACE) 5 MG capsule Take 5 mg by mouth daily.    [provider]  saccharomyces boulardii (FLORASTOR) 250 MG capsule Take 1 capsule (250 mg total) by mouth 2 (two) times daily. 12/22/19   Mercy Riding, MD  SANTYL ointment Apply 1 application topically daily. 11/28/19   [provider]  simvastatin (ZOCOR) 40 MG tablet Take 40  mg by mouth every morning.     [provider]  terconazole (TERAZOL 7) 0.4 % vaginal cream Place 1 applicator vaginally at bedtime. 09/02/18   [provider]  traZODone (DESYREL) 50 MG tablet Take 0.5 tablets (25 mg total) by mouth at bedtime as needed for sleep. 12/22/19   Mercy Riding, MD  triamcinolone cream (KENALOG) 0.1 %  12/24/19   [provider]  triamcinolone lotion (KENALOG) 0.1 % Apply 1 application topically 3 (three) times daily. 08/19/18   [provider]    Allergies Codeine  Family History  Problem Relation Age of Onset   Hypertension Mother    Heart disease Father    Prostate cancer Brother    Diabetes Maternal Aunt    Kidney cancer Neg Hx    Bladder Cancer Neg Hx     Social History Social History   Tobacco Use   Smoking status: Never   Smokeless tobacco: Never  Substance Use Topics   Alcohol use: No   Drug use: No    Review of Systems  Constitutional: No fever/chills.   Positive for generalized weakness, dizziness Eyes: No visual changes. ENT: No sore throat. Cardiovascular: Denies chest pain. Respiratory: Denies shortness of breath. Gastrointestinal: No abdominal pain.  No nausea, no vomiting.  No diarrhea.  No constipation. Genitourinary: Negative for dysuria. Musculoskeletal: Positive for for back pain. Skin: Negative for rash. Neurological: Negative for headaches, focal weakness or numbness.  ____________________________________________   PHYSICAL EXAM:  VITAL SIGNS: Vitals:   04/30/21 1005 04/30/21 1100  BP:  (!) 155/66  Pulse: 64 70  Resp: 13 17  Temp:    SpO2: 95% 99%     Constitutional: Alert and oriented x4. Well appearing and in no acute distress.  Morbidly obese.  Follows commands in all 4 extremities.  Conversational. Eyes: Conjunctivae are normal. PERRL. EOMI. Head: Minimal flat erythema to the inferior aspect of the left orbit, seems to be developing bruise.  No associated bony step-offs or signs of EOM entrapment.. Nose: No congestion/rhinnorhea. Mouth/Throat: Mucous membranes are moist.  Oropharynx non-erythematous. Neck: No stridor. No cervical spine tenderness to palpation. Cardiovascular: Normal rate, regular rhythm. Grossly normal heart sounds.  Good peripheral circulation. Respiratory: Normal respiratory effort.  No retractions. Lungs CTAB. Gastrointestinal: Soft , nondistended, nontender to palpation. No CVA tenderness. Scabbing superficial ulcerative lesion to frontal abdomen, just to the right of the umbilicus.  About 5 cm in diameter.  Right side half of this lesion seems to have the scab unroofed.  No induration, purulence or fluctuance. Musculoskeletal: No lower extremity tenderness .  No joint effusions. Superficial abrasion to the lateral aspect of the right elbow, but the elbow has full active and passive ROM without pain. Palpation of all 4 extremities otherwise no evidence of deformity, tenderness or  trauma. Neurologic:  Normal speech and language. No gross focal neurologic deficits are appreciated.  Cranial nerves II through XII intact 5/5 strength and sensation in all 4 extremities Skin:  Skin is warm, dry and intact. No rash noted. Psychiatric: Mood and affect are normal. Speech and behavior are normal.  ____________________________________________   LABS (all labs ordered are listed, but only abnormal results are displayed)  Labs Reviewed  COMPREHENSIVE METABOLIC PANEL - Abnormal; Notable for the following components:      Result Value   Potassium 5.5 (*)    Glucose, Bld 284 (*)    BUN 31 (*)    Creatinine, Ser 1.07 (*)    Calcium 8.7 (*)  Albumin 3.2 (*)    GFR, Estimated 56 (*)    All other components within normal limits  CBC WITH DIFFERENTIAL/PLATELET - Abnormal; Notable for the following components:   Eosinophils Absolute 0.7 (*)    All other components within normal limits  URINALYSIS, COMPLETE (UACMP) WITH MICROSCOPIC   ____________________________________________  12 Lead EKG  Sinus rhythm with a rate of 69 bpm.  Normal axis.  Left anterior fascicular block.  Nonspecific ST changes inferiorly without STEMI Similar to EKG from May 2021 ____________________________________________  RADIOLOGY  ED MD interpretation: 2 view CXR reviewed by me without evidence of acute cardiopulmonary pathology. CT head reviewed by me without evidence of acute intracranial pathology.  Official radiology report(s): DG Chest 2 View  Result Date: 04/30/2021 CLINICAL DATA:  Patient status post fall. EXAM: CHEST - 2 VIEW COMPARISON:  Chest radiograph 01/18/2020. FINDINGS: Monitoring leads overlie the patient. Stable enlarged cardiac and mediastinal contours. Low lung volumes. Bibasilar atelectasis. No large area pulmonary consolidation. No pleural effusion or pneumothorax. Thoracic spine degenerative changes. IMPRESSION: No acute cardiopulmonary process. Electronically Signed   By:  Lovey Newcomer M.D.   On: 04/30/2021 09:23   CT HEAD WO CONTRAST (5MM)  Result Date: 04/30/2021 CLINICAL DATA:  fall, possible syncope. eval ich EXAM: CT HEAD WITHOUT CONTRAST TECHNIQUE: Contiguous axial images were obtained from the base of the skull through the vertex without intravenous contrast. COMPARISON:  Head CT 06/12/2019. FINDINGS: Brain: No evidence of acute intracranial hemorrhage or extra-axial collection.No evidence of mass lesion/concern mass effect.The ventricles are normal in size.Minimal scattered subcortical and periventricular white matter hypodensities, nonspecific but likely sequela of chronic small vessel ischemic disease.Minimal atrophy. Vascular: No hyperdense vessel or unexpected calcification. Skull: Normal. Negative for fracture or focal lesion. Sinuses/Orbits: Scattered ethmoid air cell mucosal thickening. Other: None. IMPRESSION: No acute intracranial abnormality. Electronically Signed   By: Maurine Simmering M.D.   On: 04/30/2021 09:15   CT Lumbar Spine Wo Contrast  Result Date: 04/30/2021 CLINICAL DATA:  fall with lumbar and left paraspinal pain/TTP. eval fx EXAM: CT LUMBAR SPINE WITHOUT CONTRAST TECHNIQUE: Multidetector CT imaging of the lumbar spine was performed without intravenous contrast administration. Multiplanar CT image reconstructions were also generated. COMPARISON:  CT 01/11/2018. FINDINGS: Segmentation: 5 lumbar type vertebrae. Alignment: Trace retrolisthesis at L3-L4. Vertebrae: No acute lumbar spine fracture. No aggressive osseous lesion. Paraspinal and other soft tissues: Atrophic left kidney. Disc levels: Mild multilevel degenerative disc disease and bilateral facet arthropathy. Multilevel posterior disc osteophyte complexes. No visible impingement on noncontrast CT. IMPRESSION: No acute lumbar spine fracture. Mild multilevel degenerative disc disease and facet arthropathy. Electronically Signed   By: Maurine Simmering M.D.   On: 04/30/2021 09:11     ____________________________________________   PROCEDURES and INTERVENTIONS  Procedure(s) performed (including Critical Care):  .1-3 Lead EKG Interpretation  Date/Time: 04/30/2021 8:44 AM Performed by: Vladimir Crofts, MD Authorized by: Vladimir Crofts, MD     Interpretation: normal     ECG rate:  68   ECG rate assessment: normal     Rhythm: sinus rhythm     Ectopy: none     Conduction: normal    Medications  acetaminophen (TYLENOL) tablet 1,000 mg (1,000 mg Oral Given 04/30/21 0920)    ____________________________________________   MDM / ED COURSE   70 year old female presents to the ED for evaluation after a fall after getting up from the toilet this morning, without evidence of significant injury, and ultimately amenable to outpatient management.  Normal vitals.  Exam is  generally reassuring without evidence of neurologic or vascular deficits.  Minimal abrasions but no indications for imaging to the extremities.  Work-up is generally benign without evidence of lumbar fracture, intracranial pathology, rib fractures or PTX.  Hemolyzed metabolic panel with hyperglycemia without acidosis.  No barriers to outpatient management.  She is requesting discharge and I think this is reasonable.  We will discharge with return precautions.   Clinical Course as of 04/30/21 1310  Sat Apr 30, 2021  1009 Reassessed. Pt reports feeling fine. She has drank a cup of water and feels better. We discussed largely benign workup. She reports wanting to go home. I think this is reasonable. She reports that she "just fell." When getting up. Denies syncope. We discussed return precautions [DS]    Clinical Course User Index [DS] Vladimir Crofts, MD    ____________________________________________   FINAL CLINICAL IMPRESSION(S) / ED DIAGNOSES  Final diagnoses:  Fall, initial encounter     ED Discharge Orders     None        Kiren Mcisaac Tamala Julian   Note:  This document was prepared using Dragon voice  recognition software and may include unintentional dictation errors.    Vladimir Crofts, MD 04/30/21 1314

## 2021-04-30 NOTE — ED Triage Notes (Signed)
Pt arrives via EMS from home where she lives with her brother- pt told EMS that she got up too fast and got dizzy and fell- pt was incontinent of urine and that's not normal for her- pt was able to answer all of EMS's questions until they helped her get changed and then EMS states that she was unable to answer their questions and unable to tell them what happened- pt has a sore on her R abdomen that's been there for a couple weeks- pt having back pain from the fall- pt now A&O x4 with equal pupils- pt normally uses a walker at home

## 2021-04-30 NOTE — ED Notes (Signed)
Pt in scans. °

## 2021-04-30 NOTE — Discharge Instructions (Signed)
Make sure you change positions slowly to prevent further falls. Return to the ED with any passing out or worsening symptoms.

## 2021-07-18 ENCOUNTER — Emergency Department
Admission: EM | Admit: 2021-07-18 | Discharge: 2021-07-18 | Disposition: A | Discharge status: Home / Self Care | Payer: Medicare PPO | Attending: Emergency Medicine | Admitting: Emergency Medicine

## 2021-07-18 ENCOUNTER — Other Ambulatory Visit: Payer: Self-pay

## 2021-07-18 ENCOUNTER — Emergency Department: Payer: Medicare PPO

## 2021-07-18 DIAGNOSIS — R5381 Other malaise: Secondary | ICD-10-CM | POA: Diagnosis not present

## 2021-07-18 DIAGNOSIS — F0390 Unspecified dementia without behavioral disturbance: Secondary | ICD-10-CM | POA: Insufficient documentation

## 2021-07-18 DIAGNOSIS — Z7982 Long term (current) use of aspirin: Secondary | ICD-10-CM | POA: Insufficient documentation

## 2021-07-18 DIAGNOSIS — Z723 Lack of physical exercise: Secondary | ICD-10-CM | POA: Insufficient documentation

## 2021-07-18 DIAGNOSIS — I5032 Chronic diastolic (congestive) heart failure: Secondary | ICD-10-CM | POA: Insufficient documentation

## 2021-07-18 DIAGNOSIS — Z8542 Personal history of malignant neoplasm of other parts of uterus: Secondary | ICD-10-CM | POA: Diagnosis not present

## 2021-07-18 DIAGNOSIS — Z20822 Contact with and (suspected) exposure to covid-19: Secondary | ICD-10-CM | POA: Diagnosis not present

## 2021-07-18 DIAGNOSIS — I110 Hypertensive heart disease with heart failure: Secondary | ICD-10-CM | POA: Diagnosis not present

## 2021-07-18 DIAGNOSIS — Z79899 Other long term (current) drug therapy: Secondary | ICD-10-CM | POA: Diagnosis not present

## 2021-07-18 DIAGNOSIS — E119 Type 2 diabetes mellitus without complications: Secondary | ICD-10-CM | POA: Insufficient documentation

## 2021-07-18 DIAGNOSIS — R5383 Other fatigue: Secondary | ICD-10-CM | POA: Diagnosis not present

## 2021-07-18 DIAGNOSIS — R1110 Vomiting, unspecified: Secondary | ICD-10-CM | POA: Insufficient documentation

## 2021-07-18 DIAGNOSIS — R1030 Lower abdominal pain, unspecified: Secondary | ICD-10-CM | POA: Insufficient documentation

## 2021-07-18 DIAGNOSIS — Z7984 Long term (current) use of oral hypoglycemic drugs: Secondary | ICD-10-CM | POA: Diagnosis not present

## 2021-07-18 DIAGNOSIS — Z794 Long term (current) use of insulin: Secondary | ICD-10-CM | POA: Diagnosis not present

## 2021-07-18 LAB — BASIC METABOLIC PANEL
Anion gap: 7 (ref 5–15)
BUN: 35 mg/dL — ABNORMAL HIGH (ref 8–23)
CO2: 22 mmol/L (ref 22–32)
Calcium: 8.8 mg/dL — ABNORMAL LOW (ref 8.9–10.3)
Chloride: 105 mmol/L (ref 98–111)
Creatinine, Ser: 1.28 mg/dL — ABNORMAL HIGH (ref 0.44–1.00)
GFR, Estimated: 45 mL/min — ABNORMAL LOW (ref >60)
Glucose, Bld: 138 mg/dL — ABNORMAL HIGH (ref 70–99)
Potassium: 5.5 mmol/L — ABNORMAL HIGH (ref 3.5–5.1)
Sodium: 134 mmol/L — ABNORMAL LOW (ref 135–145)

## 2021-07-18 LAB — CBC
HCT: 48.5 % — ABNORMAL HIGH (ref 36.0–46.0)
Hemoglobin: 15.7 g/dL — ABNORMAL HIGH (ref 12.0–15.0)
MCH: 29.6 pg (ref 26.0–34.0)
MCHC: 32.4 g/dL (ref 30.0–36.0)
MCV: 91.3 fL (ref 80.0–100.0)
Platelets: 316 10*3/uL (ref 150–400)
RBC: 5.31 MIL/uL — ABNORMAL HIGH (ref 3.87–5.11)
RDW: 13.4 % (ref 11.5–15.5)
WBC: 8.7 10*3/uL (ref 4.0–10.5)
nRBC: 0 % (ref 0.0–0.2)

## 2021-07-18 LAB — HEPATIC FUNCTION PANEL
ALT: 23 U/L (ref 0–44)
AST: 29 U/L (ref 15–41)
Albumin: 2.9 g/dL — ABNORMAL LOW (ref 3.5–5.0)
Alkaline Phosphatase: 73 U/L (ref 38–126)
Bilirubin, Direct: <0.1 mg/dL (ref 0.0–0.2)
Total Bilirubin: 0.7 mg/dL (ref 0.3–1.2)
Total Protein: 7 g/dL (ref 6.5–8.1)

## 2021-07-18 LAB — RESP PANEL BY RT-PCR (FLU A&B, COVID) ARPGX2
Influenza A by PCR: NEGATIVE
Influenza B by PCR: NEGATIVE
SARS Coronavirus 2 by RT PCR: NEGATIVE

## 2021-07-18 LAB — LIPASE, BLOOD: Lipase: 54 U/L — ABNORMAL HIGH (ref 11–51)

## 2021-07-18 IMAGING — CT CT ABD-PELV W/ CM
2 of 5 series · 16 of 46 positions shown, 18 images · IV contrast (APPLIED)
Comparison: CT abdomen pelvis [DATE]

CLINICAL DATA: Of the and vomiting.  Right lower quadrant pain.

EXAM:
CT ABDOMEN AND PELVIS WITH CONTRAST
TECHNIQUE: Multidetector CT imaging of the abdomen and pelvis was performed
using the standard protocol following bolus administration of
intravenous contrast.
CONTRAST:  100mL OMNIPAQUE IOHEXOL 300 MG/ML  SOLN

[Series 2: routine abd/pel with · axial · 0.98mm/px · z∈[-920,-440]mm · 13 of 108 slices shown, 15 images]
[im 6/108  soft-tissue]
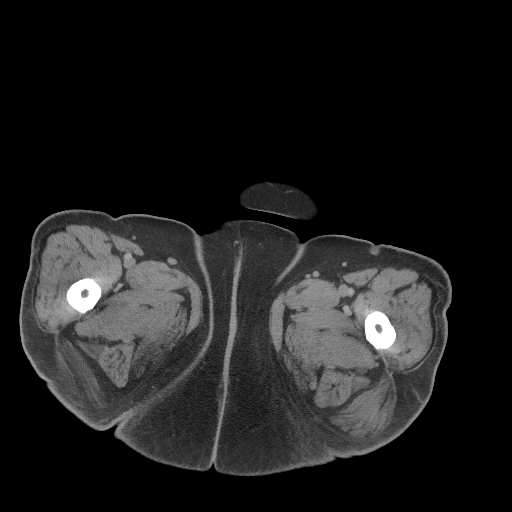
[im 6/108  bone]
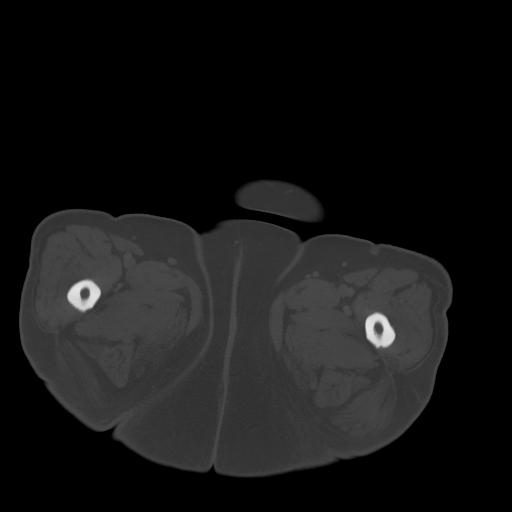
[im 17/108  soft-tissue]
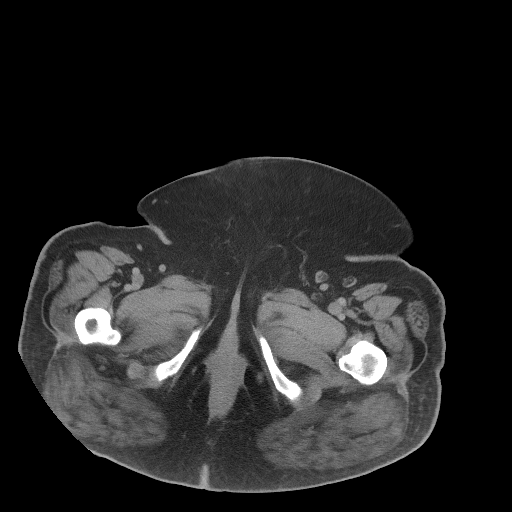
[im 22/108  soft-tissue]
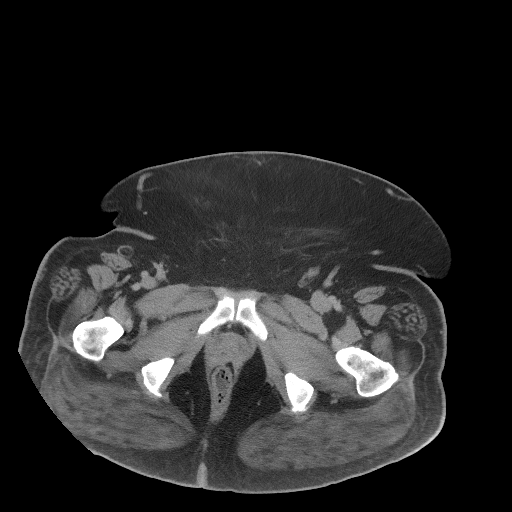
[im 33/108  soft-tissue]
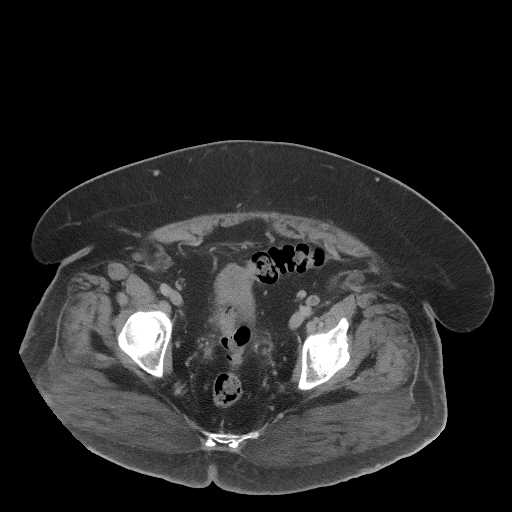
[im 38/108  soft-tissue]
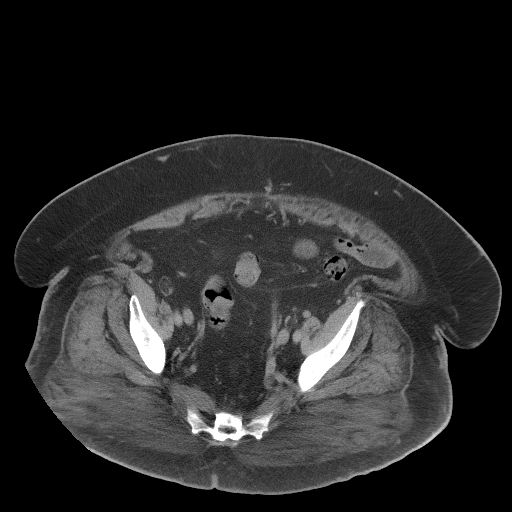
[im 49/108  soft-tissue]
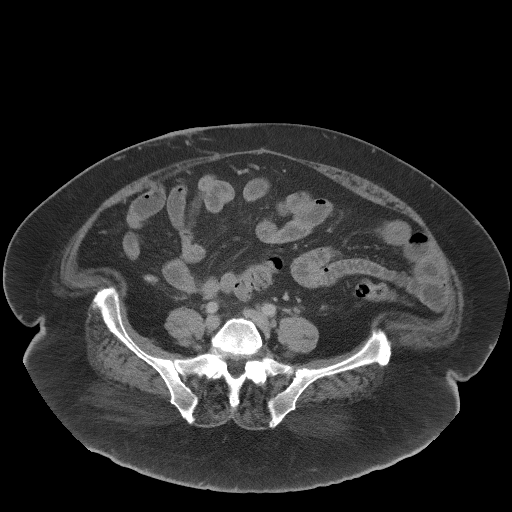
[im 54/108  soft-tissue]
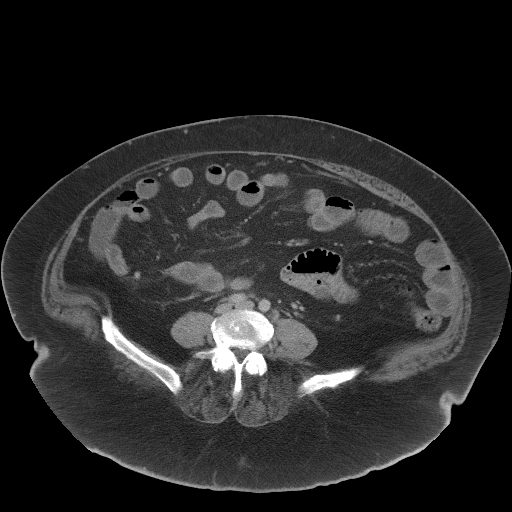
[im 59/108  soft-tissue]
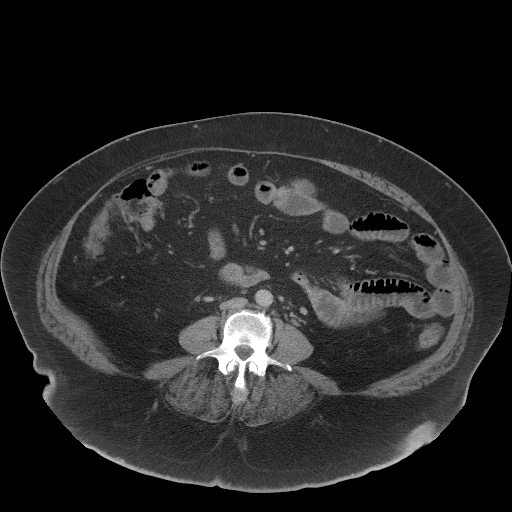
[im 70/108  soft-tissue]
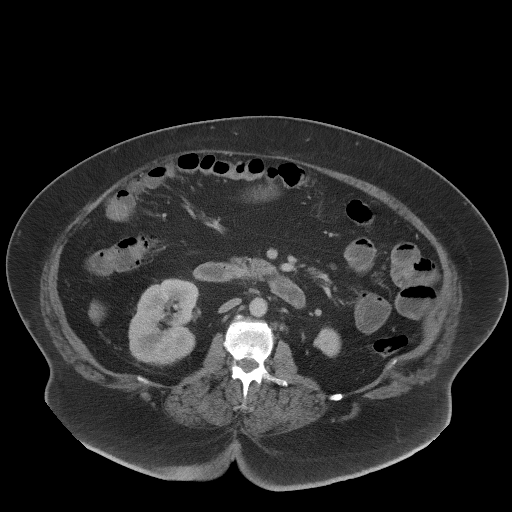
[im 70/108  bone]
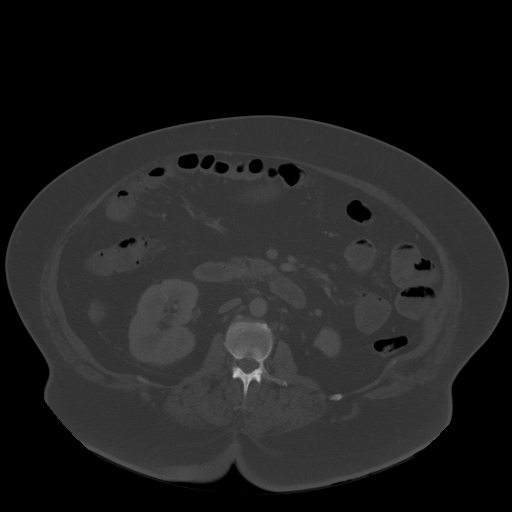
[im 75/108  soft-tissue]
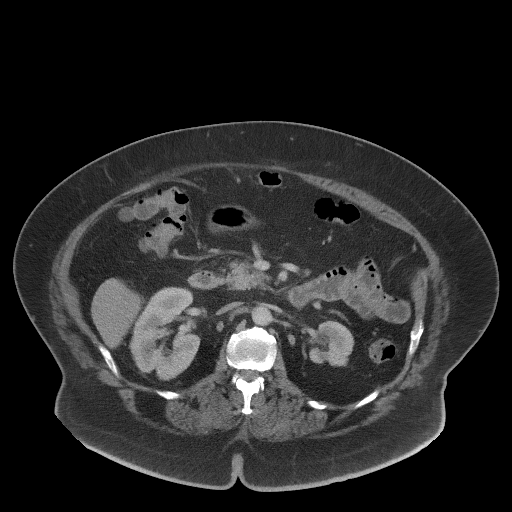
[im 86/108  soft-tissue]
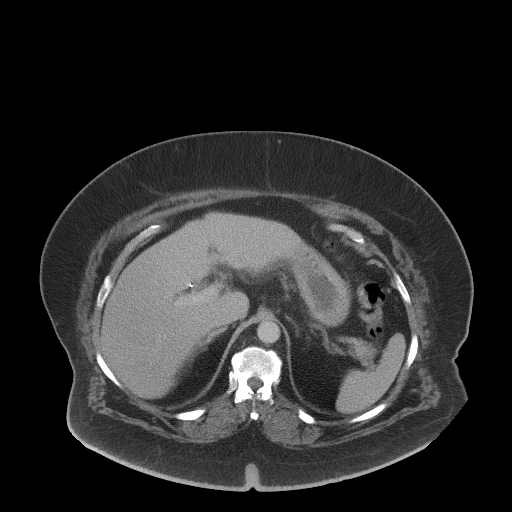
[im 91/108  soft-tissue]
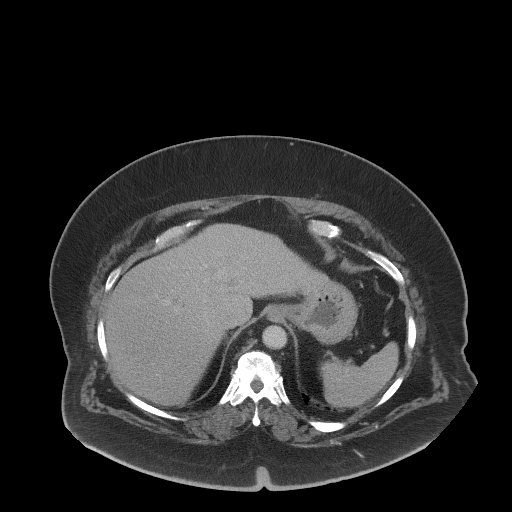
[im 102/108  soft-tissue]
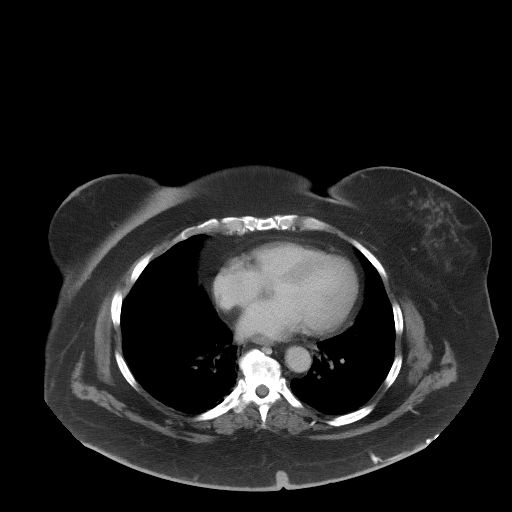

[Series 5: coronal st · coronal · 0.98mm/px · 3 of 126 slices shown]
[im 42/126  soft-tissue]
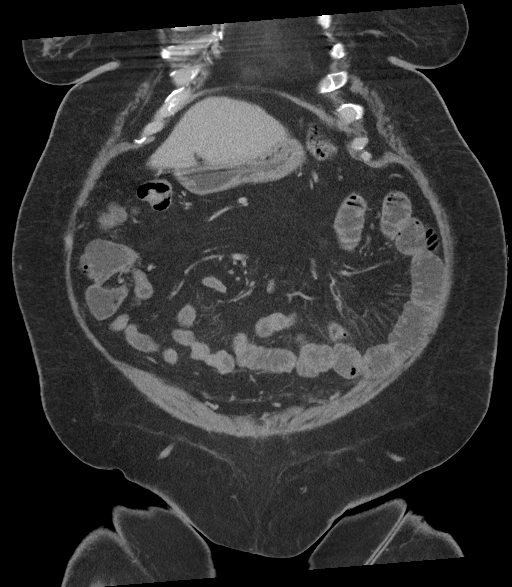
[im 56/126  soft-tissue]
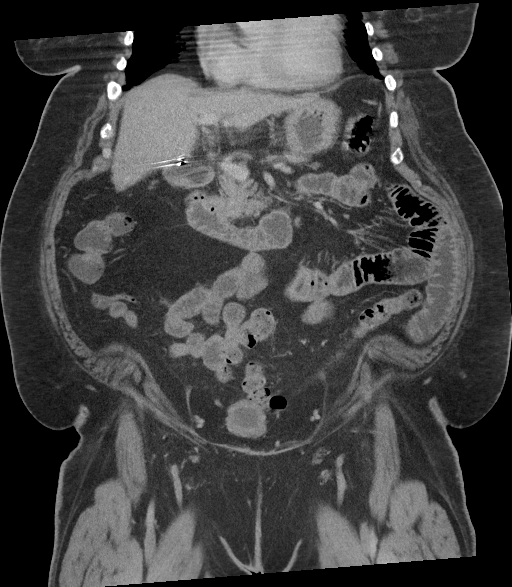
[im 70/126  soft-tissue]
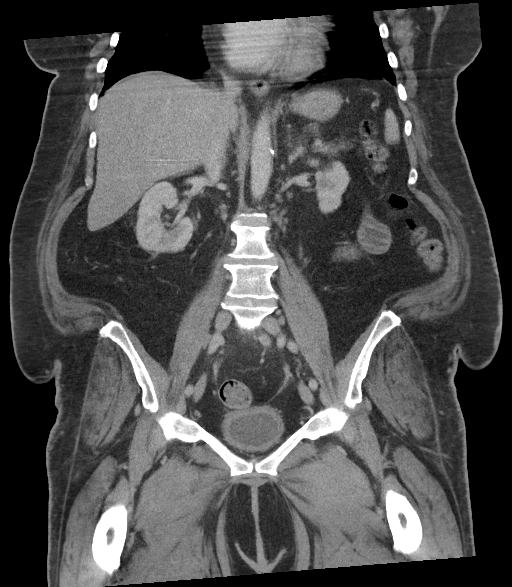

[16 of 46 positions shown; findings below may reference images not displayed]

FINDINGS: Lower chest: The lung bases are clear without focal nodule, mass, or
airspace disease. Heart is mildly enlarged. No edema or effusion is
present.

Hepatobiliary: No focal liver abnormality is seen. Status post
cholecystectomy. No biliary dilatation.

Pancreas: Unremarkable. No pancreatic ductal dilatation or
surrounding inflammatory changes.

Spleen: Normal in size without focal abnormality.

Adrenals/Urinary Tract: Chronic scarring of the left kidney is again
noted. Right kidney is unremarkable. No stone or mass lesion is
present. No obstruction is present. Ureters are within normal
limits. The urinary bladder is mostly collapsed.

Stomach/Bowel: Stomach is within normal limits. Duodenal
diverticulum is again noted. No inflammatory changes are associated.
Distal duodenum is within normal limits. Thickened loops of small
bowel are present on the left. Fluid levels are present. No dilated
loops of bowel are present. There is some stranding in the
mesentery. Terminal ileum is within normal limits. The appendix is
visualized and within normal limits. Contrast and air are present in
the appendix to the tip. The ascending and transverse colon are
within normal limits. Descending colon is unremarkable. Sigmoid
colon is within normal limits.

Vascular/Lymphatic: Minimal atherosclerotic changes are present. No
aneurysm is present. No significant retroperitoneal adenopathy is
present. Bilateral inguinal lymph nodes appear reactive.

Reproductive: Status post hysterectomy. No adnexal masses.

Other: Small umbilical hernia contains fat. No other significant
ventral hernias are present. No free fluid or free air is present.

Musculoskeletal: Slight degenerative retrolisthesis again noted at
L1-2, L2-3 and L3-4. No focal osseous lesions are present. Fused
anterior osteophytes are present in the lower thoracic spine. Bony
pelvis is within normal limits. Degenerative changes are noted in
both hips.
IMPRESSION: 1. Some stranding is noted in the mesentery. No associated
obstruction.
2. No other acute or focal lesion to explain the patient's symptoms.
Normal CT appearance of the appendix.
3. Chronic scarring of the left kidney.
4. Cholecystectomy and hysterectomy.
5. Small umbilical hernia contains fat.

## 2021-07-18 MED ORDER — ONDANSETRON 4 MG PO TBDP: 4.0000 mg | ORAL_TABLET | Freq: Three times a day (TID) | ORAL | 0 refills | Status: DC | PRN

## 2021-07-18 MED ORDER — ACETAMINOPHEN 325 MG PO TABS: 650.0000 mg | ORAL_TABLET | Freq: Four times a day (QID) | ORAL | 0 refills | Status: AC | PRN

## 2021-07-18 MED ORDER — ONDANSETRON HCL 4 MG/2ML IJ SOLN
4.0000 mg | Freq: Once | INTRAMUSCULAR | Status: AC
  Administered 2021-07-18: 4 mg via INTRAVENOUS
  Filled 2021-07-18: qty 2

## 2021-07-18 MED ORDER — SODIUM CHLORIDE 0.9 % IV BOLUS
1000.0000 mL | Freq: Once | INTRAVENOUS | Status: AC
  Administered 2021-07-18: 1000 mL via INTRAVENOUS

## 2021-07-18 MED ORDER — IOHEXOL 300 MG/ML  SOLN
100.0000 mL | Freq: Once | INTRAMUSCULAR | Status: AC | PRN
  Administered 2021-07-18: 100 mL via INTRAVENOUS
  Filled 2021-07-18: qty 100

## 2021-07-18 MED ORDER — ALUMINUM-MAGNESIUM-SIMETHICONE 200-200-20 MG/5ML PO SUSP: 30.0000 mL | Freq: Three times a day (TID) | ORAL | 0 refills | Status: DC

## 2021-07-18 MED ORDER — ACETAMINOPHEN 500 MG PO TABS
1000.0000 mg | ORAL_TABLET | Freq: Once | ORAL | Status: AC
  Administered 2021-07-18: 1000 mg via ORAL
  Filled 2021-07-18: qty 2

## 2021-07-18 MED ORDER — FAMOTIDINE 20 MG PO TABS: 20.0000 mg | ORAL_TABLET | Freq: Two times a day (BID) | ORAL | 0 refills | Status: DC

## 2021-07-18 NOTE — ED Triage Notes (Signed)
Pt comes into the ED via EMS from home with UTI, is suppose to be on abx but EMS reports there were 6 pills left and the prescription was written a week ago for a one week treatment   130/60 66HR 18RR CBG90 98.1 oral temp

## 2021-07-18 NOTE — ED Notes (Addendum)
Pt given diet ginger ale for PO challenge. Family member with pt wants pt to be kept overnight in the hospital-msg relayed to Dr. Joni Fears.

## 2021-07-18 NOTE — Discharge Instructions (Addendum)
Your blood tests and CT scan of the abdomen and pelvis today were all okay.  Please take tylenol and zofran as needed for pain and nausea and focus on drinking adequate fluids to remain hydrated. Please follow up with your doctor for continued monitoring of your symptoms.  Until you are able to eat more regularly, please decrease your Tresiba dose from your usual 200 units each night. Instead, use a dose of 140 units each night.  Continue to monitor your blood sugar and use the sliding-scale insulin for elevated blood sugar throughout the day.

## 2021-07-18 NOTE — ED Triage Notes (Signed)
See first nurse note, pt to ED from home [facility]. Reports lower pelvic pain for the past few days, dx with UTI. Denies urinary sx. +diarrhea yesterday

## 2021-07-18 NOTE — ED Provider Notes (Signed)
[facility] Emergency Department Provider Note  ____________________________________________  Time seen: Approximately 4:25 PM  I have reviewed the triage vital signs and the nursing notes.   HISTORY  Chief Complaint Abdominal Pain and Urinary Tract Infection  Level 5 Caveat: Portions of the History and Physical including HPI and review of systems are unable to be completely obtained due to patient being a poor historian due to dementia   HPI Judith Shaw is a 70 y.o. female with a past history of anxiety cancer CHF diabetes hypertension and dementia who is brought to the ED due to suprapubic pain for the past few days.  Recently diagnosed with UTI and placed on Macrobid by primary care.  Caregiver at bedside reports that patient has not been eating well over the past week and seems to have low energy level.  Patient has had decreased activity as well.  She has had a few episodes of vomiting.  No chest pain or shortness of breath, no fever    Past Medical History:  Diagnosis Date   Anxiety    Cancer ([place])    pt states hx of uterine cancer and had a complete hyst   CHF (congestive heart failure) ([place])    Depression    Diabetes mellitus without complication ([place])    Hyperlipidemia    Hypertension      Patient Active Problem List   Diagnosis Date Noted   Atherosclerosis of native arteries of the extremities with ulceration ([place]) 02/24/2020   S/P amputation of lesser toe, left ([place]) 01/13/2020   Dementia without behavioral disturbance ([place]) 12/16/2019   Acute osteomyelitis of left foot ([place]) 12/15/2019   Memory impairment 11/30/2019   Complaints of memory disturbance 11/20/2019   Diabetic ulcer of left midfoot associated with type 2 diabetes mellitus ([place]) 11/18/2019   Seizure-like activity ([place]) 07/15/2019   Intractable 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 06/19/2019   Uncontrolled type 2 diabetes mellitus with hyperglycemia,  with long-term current use of insulin ([place]) 04/22/2019   Panic attacks 03/26/2019   Benign essential HTN 07/19/2018   Fatty liver 05/15/2018   Hx of adenomatous colonic polyps 05/15/2018   Type 2 diabetes mellitus with diabetic nephropathy, with long-term current use of insulin ([place]) 05/01/2018   Tinnitus, bilateral 04/05/2017   Concussion syndrome 04/03/2017   Concussion without loss of consciousness 01/24/2017   Difficulty walking 01/24/2017   Headache disorder 01/24/2017   Numbness and tingling 01/24/2017   Postural urinary incontinence 01/24/2017   Sepsis ([place]) 09/21/2016   UTI (urinary tract infection) 09/21/2016   HTN (hypertension) 09/21/2016   Diabetes ([place]) 09/21/2016   Depression 09/21/2016   Health care maintenance 10/05/2015   Recurrent major depressive disorder, in full remission ([place]) 06/16/2014   DM (diabetes mellitus) type II controlled, neurological manifestation ([place]) 06/12/2014   Morbid obesity ([place]) 06/12/2014   Hyperlipidemia 03/10/2014   Chronic diastolic CHF (congestive heart failure) ([place]) 19/62/2297   H/O diastolic dysfunction 98/92/1194   Sleep apnea 03/10/2014   SOB (shortness of breath) 03/10/2014     Past Surgical History:  Procedure Laterality Date   ABDOMINAL HYSTERECTOMY     AMPUTATION Left 12/19/2019   Procedure: AMPUTATION RAY Left 5th;  Surgeon: Caroline More, DPM;  Location: [facility] ORS;  Service: Podiatry;  Laterality: Left;   CHOLECYSTECTOMY     LOWER EXTREMITY ANGIOGRAPHY Left 12/17/2019   Procedure: Lower Extremity Angiography;  Surgeon: Algernon Huxley, MD;  Location: [facility];  Service: Cardiovascular;  Laterality:  Left;     Prior to Admission medications   Medication Sig Start Date End Date Taking? Authorizing Provider  acetaminophen (TYLENOL) 325 MG tablet Take 2 tablets (650 mg total) by mouth every 6 (six) hours as needed. 07/18/21  Yes Carrie Mew, MD  aluminum-magnesium hydroxide-simethicone (MAALOX) 200-200-20  MG/5ML SUSP Take 30 mLs by mouth 4 (four) times daily -  before meals and at bedtime. 07/18/21  Yes Carrie Mew, MD  famotidine (PEPCID) 20 MG tablet Take 1 tablet (20 mg total) by mouth 2 (two) times daily. 07/18/21  Yes Carrie Mew, MD  ondansetron (ZOFRAN ODT) 4 MG disintegrating tablet Take 1 tablet (4 mg total) by mouth every 8 (eight) hours as needed for nausea or vomiting. 07/18/21  Yes Carrie Mew, MD  amoxicillin-clavulanate (AUGMENTIN) 875-125 MG tablet Take 1 tablet by mouth 2 (two) times daily. 12/24/19   Jennye Boroughs, MD  aspirin EC 81 MG tablet Take 81 mg by mouth daily.    [provider]  Baclofen 5 MG TABS Take 5 mg by mouth at bedtime. 06/12/19   Laban Emperor, PA-C  clotrimazole-betamethasone (LOTRISONE) cream Apply 1 application topically 2 (two) times daily.    [provider]  donepezil (ARICEPT) 5 MG tablet Take 5 mg by mouth daily. 11/25/19   [provider]  DULoxetine (CYMBALTA) 60 MG capsule  12/24/19   [provider]  DULoxetine HCl 60 MG CSDR Take 60 mg by mouth daily.     [provider]  enoxaparin (LOVENOX) 40 MG/0.4ML injection Inject 0.4 mLs (40 mg total) into the skin daily for 17 days. 12/24/19 01/10/20  Jennye Boroughs, MD  furosemide (LASIX) 20 MG tablet Take 20 mg by mouth every other day.  11/29/16   [provider]  gentian violet 2 % topical solution Apply 0.5 mLs topically in the morning and at bedtime. Apply between the toes twice daily. 10/20/19   [provider]  insulin aspart (NOVOLOG) 100 UNIT/ML injection Inject 0-15 Units into the skin 3 (three) times daily with meals. CBG 70 - 120: 0 units CBG 121 - 150: 2 units CBG 151 - 200: 3 units CBG 201 - 250: 5 units CBG 251 - 300: 8 units CBG 301 - 350: 11 units CBG 351 - 400: 15 units CBG > 400: call MD and obtain STAT lab verification 12/22/19   Mercy Riding, MD  insulin aspart (NOVOLOG) 100 UNIT/ML injection Inject 6 Units  into the skin 3 (three) times daily with meals. 12/22/19   Mercy Riding, MD  insulin glargine (LANTUS) 100 UNIT/ML injection Inject 0.6 mLs (60 Units total) into the skin 2 (two) times daily. 12/22/19   Mercy Riding, MD  ketoconazole (NIZORAL) 2 % cream Apply 1 application topically in the morning and at bedtime. 09/24/19   [provider]  metFORMIN (GLUCOPHAGE) 500 MG tablet  12/24/19   [provider]  metFORMIN (GLUCOPHAGE-XR) 500 MG 24 hr tablet Take 500 mg by mouth daily. With dinner.    [provider]  mirabegron ER (MYRBETRIQ) 25 MG TB24 tablet Take 1 tablet (25 mg total) by mouth daily. 07/01/19   Zara Council A, PA-C  potassium chloride (K-DUR,KLOR-CON) 10 MEQ tablet Take 10 mEq by mouth daily.    [provider]  potassium chloride (KLOR-CON) 10 MEQ tablet  02/15/20   [provider]  QUEtiapine (SEROQUEL) 25 MG tablet Take by mouth. 01/27/20   [provider]  ramipril (ALTACE) 5 MG capsule Take  5 mg by mouth daily.    [provider]  saccharomyces boulardii (FLORASTOR) 250 MG capsule Take 1 capsule (250 mg total) by mouth 2 (two) times daily. 12/22/19   Mercy Riding, MD  SANTYL ointment Apply 1 application topically daily. 11/28/19   [provider]  simvastatin (ZOCOR) 40 MG tablet Take 40 mg by mouth every morning.     [provider]  terconazole (TERAZOL 7) 0.4 % vaginal cream Place 1 applicator vaginally at bedtime. 09/02/18   [provider]  traZODone (DESYREL) 50 MG tablet Take 0.5 tablets (25 mg total) by mouth at bedtime as needed for sleep. 12/22/19   Mercy Riding, MD  triamcinolone cream (KENALOG) 0.1 %  12/24/19   [provider]  triamcinolone lotion (KENALOG) 0.1 % Apply 1 application topically 3 (three) times daily. 08/19/18   [provider]     Allergies Codeine   Family History  Problem Relation Age of Onset   Hypertension Mother    Heart disease Father     Prostate cancer Brother    Diabetes Maternal Aunt    Kidney cancer Neg Hx    Bladder Cancer Neg Hx     Social History Social History   Tobacco Use   Smoking status: Never   Smokeless tobacco: Never  Substance Use Topics   Alcohol use: No   Drug use: No    Review of Systems  Constitutional:   No fever or chills.  ENT:   No sore throat. No rhinorrhea. Cardiovascular:   No chest pain or syncope. Respiratory:   No dyspnea or cough. Gastrointestinal:   Negative for abdominal pain, vomiting and, positive diarrhea.  Musculoskeletal:   Negative for focal pain or swelling All other systems reviewed and are negative except as documented above in ROS and HPI.  ____________________________________________   PHYSICAL EXAM:  VITAL SIGNS: ED Triage Vitals  Enc Vitals Group     BP 07/18/21 1025 111/82     Pulse Rate 07/18/21 1025 66     Resp 07/18/21 1024 20     Temp 07/18/21 1024 97.6 F (36.4 C)     Temp Source 07/18/21 1024 Oral     SpO2 07/18/21 1025 96 %     Weight 07/18/21 1024 250 lb (113.4 kg)     Height 07/18/21 1024 5\' 5"  (1.651 m)     Head Circumference --      Peak Flow --      Pain Score 07/18/21 1024 7     Pain Loc --      Pain Edu? --      Excl. in [place]? --     Vital signs reviewed, nursing assessments reviewed.   Constitutional:   Alert and oriented to self. Non-toxic appearance. Eyes:   Conjunctivae are normal. EOMI. PERRL. ENT      Head:   Normocephalic and atraumatic.      Nose:   Normal.      Mouth/Throat:   Dry mucous membranes      Neck:   No meningismus. Full ROM. Hematological/Lymphatic/Immunilogical:   No cervical lymphadenopathy. Cardiovascular:   RRR. Symmetric bilateral radial and DP pulses.  No murmurs. Cap refill less than 2 seconds. Respiratory:   Normal respiratory effort without tachypnea/retractions. Breath sounds are clear and equal bilaterally. No wheezes/rales/rhonchi. Gastrointestinal:   Soft and nontender. Non distended. There is  no CVA tenderness.  No rebound, rigidity, or guarding. Genitourinary:   deferred Musculoskeletal:   Normal range  of motion in all extremities. No joint effusions.  No lower extremity tenderness.  No edema. Neurologic:   Normal speech and language.  Motor grossly intact. No acute focal neurologic deficits are appreciated.  Skin:    Skin is warm, dry and intact. No rash noted.  No petechiae, purpura, or bullae.  ____________________________________________    LABS (pertinent positives/negatives) (all labs ordered are listed, but only abnormal results are displayed) Labs Reviewed  CBC - Abnormal; Notable for the following components:      Result Value   RBC 5.31 (*)    Hemoglobin 15.7 (*)    HCT 48.5 (*)    All other components within normal limits  BASIC METABOLIC PANEL - Abnormal; Notable for the following components:   Sodium 134 (*)    Potassium 5.5 (*)    Glucose, Bld 138 (*)    BUN 35 (*)    Creatinine, Ser 1.28 (*)    Calcium 8.8 (*)    GFR, Estimated 45 (*)    All other components within normal limits  HEPATIC FUNCTION PANEL - Abnormal; Notable for the following components:   Albumin 2.9 (*)    All other components within normal limits  LIPASE, BLOOD - Abnormal; Notable for the following components:   Lipase 54 (*)    All other components within normal limits  RESP PANEL BY RT-PCR (FLU A&B, COVID) ARPGX2  URINALYSIS, COMPLETE (UACMP) WITH MICROSCOPIC   ____________________________________________   EKG    ____________________________________________    RADIOLOGY  CT ABDOMEN PELVIS W CONTRAST  Result Date: 07/18/2021 CLINICAL DATA:  Of the and vomiting.  Right lower quadrant pain. EXAM: CT ABDOMEN AND PELVIS WITH CONTRAST TECHNIQUE: Multidetector CT imaging of the abdomen and pelvis was performed using the standard protocol following bolus administration of intravenous contrast. CONTRAST:  149mL OMNIPAQUE IOHEXOL 300 MG/ML  SOLN COMPARISON:  CT abdomen pelvis  01/11/2018 FINDINGS: Lower chest: The lung bases are clear without focal nodule, mass, or airspace disease. Heart is mildly enlarged. No edema or effusion is present. Hepatobiliary: No focal liver abnormality is seen. Status post cholecystectomy. No biliary dilatation. Pancreas: Unremarkable. No pancreatic ductal dilatation or surrounding inflammatory changes. Spleen: Normal in size without focal abnormality. Adrenals/Urinary Tract: Chronic scarring of the left kidney is again noted. Right kidney is unremarkable. No stone or mass lesion is present. No obstruction is present. Ureters are within normal limits. The urinary bladder is mostly collapsed. Stomach/Bowel: Stomach is within normal limits. Duodenal diverticulum is again noted. No inflammatory changes are associated. Distal duodenum is within normal limits. Thickened loops of small bowel are present on the left. Fluid levels are present. No dilated loops of bowel are present. There is some stranding in the mesentery. Terminal ileum is within normal limits. The appendix is visualized and within normal limits. Contrast and air are present in the appendix to the tip. The ascending and transverse colon are within normal limits. Descending colon is unremarkable. Sigmoid colon is within normal limits. Vascular/Lymphatic: Minimal atherosclerotic changes are present. No aneurysm is present. No significant retroperitoneal adenopathy is present. Bilateral inguinal lymph nodes appear reactive. Reproductive: Status post hysterectomy. No adnexal masses. Other: Small umbilical hernia contains fat. No other significant ventral hernias are present. No free fluid or free air is present. Musculoskeletal: Slight degenerative retrolisthesis again noted at L1-2, L2-3 and L3-4. No focal osseous lesions are present. Fused anterior osteophytes are present in the lower thoracic spine. Bony pelvis is within normal limits. Degenerative changes are noted in both  hips. IMPRESSION: 1. Some  stranding is noted in the mesentery. No associated obstruction. 2. No other acute or focal lesion to explain the patient's symptoms. Normal CT appearance of the appendix. 3. Chronic scarring of the left kidney. 4. Cholecystectomy and hysterectomy. 5. Small umbilical hernia contains fat. Electronically Signed   By: San Morelle M.D.   On: 07/18/2021 13:50    ____________________________________________   PROCEDURES Procedures  ____________________________________________  DIFFERENTIAL DIAGNOSIS   Appendicitis, cystitis, kidney stone, colitis, diverticulitis bowel obstruction  CLINICAL IMPRESSION / ASSESSMENT AND PLAN / ED COURSE  Medications ordered in the ED: Medications  sodium chloride 0.9 % bolus 1,000 mL (0 mLs Intravenous Stopped 07/18/21 1401)  ondansetron (ZOFRAN) injection 4 mg (4 mg Intravenous Given 07/18/21 1301)  iohexol (OMNIPAQUE) 300 MG/ML solution 100 mL (100 mLs Intravenous Contrast Given 07/18/21 1333)  acetaminophen (TYLENOL) tablet 1,000 mg (1,000 mg Oral Given 07/18/21 1552)    Pertinent labs & imaging results that were available during my care of the patient were reviewed by me and considered in my medical decision making (see chart for details).  Judith Shaw was evaluated in Emergency Department on 07/18/2021 for the symptoms described in the history of present illness. She was evaluated in the context of the global COVID-19 pandemic, which necessitated consideration that the patient might be at risk for infection with the SARS-CoV-2 virus that causes COVID-19. Institutional protocols and algorithms that pertain to the evaluation of patients at risk for COVID-19 are in a state of rapid change based on information released by regulatory bodies including the CDC and federal and state organizations. These policies and algorithms were followed during the patient's care in the ED.   Patient presents with malaise fatigue decreased oral intake and a few episodes of  vomiting.  Labs show baseline CKD, some evidence of dehydration.  Serum labs otherwise unremarkable.  Lungs are clear, oxygenation is normal, no respiratory symptoms.  Reviewed EMR, previous UA appears contaminated, not a clear UTI.  CT abdomen pelvis obtained which is unremarkable, no acute findings, no appendicitis or obstructing stone or other inflammatory changes.  Suspect a viral illness. Had a lengthy conversation with the patient's caregiver at bedside and her sister (who identifies herself as POA) who lives in [place].  Will place a request for social work evaluation and home health needs, recommend close follow-up with PCP.       ____________________________________________   FINAL CLINICAL IMPRESSION(S) / ED DIAGNOSES    Final diagnoses:  Malaise and fatigue  Physical deconditioning  Chronic dementia ([place])  Type 2 diabetes mellitus without complication, with long-term current use of insulin [facility])     ED Discharge Orders          Ordered    acetaminophen (TYLENOL) 325 MG tablet  Every 6 hours PRN        07/18/21 1625    famotidine (PEPCID) 20 MG tablet  2 times daily        07/18/21 1625    ondansetron (ZOFRAN ODT) 4 MG disintegrating tablet  Every 8 hours PRN        07/18/21 1625    aluminum-magnesium hydroxide-simethicone (MAALOX) 202-542-70 MG/5ML SUSP  3 times daily before meals & bedtime        07/18/21 1625            Portions of this note were generated with dragon dictation software. Dictation errors may occur despite best attempts at proofreading.    Carrie Mew, MD 07/18/21 [phone]

## 2021-07-18 NOTE — ED Notes (Signed)
Pt repositioned in bed for comfort. No additional needs verbalized at this time. Pt resting comfortably in bed, NAD, family at [facility]. Bed low & locked; call light & personal items within reach.

## 2021-07-27 ENCOUNTER — Other Ambulatory Visit: Payer: Self-pay

## 2021-07-27 ENCOUNTER — Emergency Department: Payer: Medicare PPO

## 2021-07-27 ENCOUNTER — Inpatient Hospital Stay
Admission: EM | Admit: 2021-07-27 | Discharge: 2021-08-02 | DRG: 871 | Disposition: A | Discharge status: Skilled Nursing Facility | Payer: Medicare PPO | Source: Ambulatory Visit | Attending: Internal Medicine | Admitting: Internal Medicine

## 2021-07-27 DIAGNOSIS — N10 Acute tubulo-interstitial nephritis: Secondary | ICD-10-CM | POA: Diagnosis present

## 2021-07-27 DIAGNOSIS — G4733 Obstructive sleep apnea (adult) (pediatric): Secondary | ICD-10-CM | POA: Diagnosis present

## 2021-07-27 DIAGNOSIS — N179 Acute kidney failure, unspecified: Secondary | ICD-10-CM | POA: Diagnosis present

## 2021-07-27 DIAGNOSIS — Z6841 Body Mass Index (BMI) 40.0 and over, adult: Secondary | ICD-10-CM

## 2021-07-27 DIAGNOSIS — R451 Restlessness and agitation: Secondary | ICD-10-CM

## 2021-07-27 DIAGNOSIS — A419 Sepsis, unspecified organism: Secondary | ICD-10-CM

## 2021-07-27 DIAGNOSIS — L304 Erythema intertrigo: Secondary | ICD-10-CM | POA: Diagnosis present

## 2021-07-27 DIAGNOSIS — E1121 Type 2 diabetes mellitus with diabetic nephropathy: Secondary | ICD-10-CM | POA: Diagnosis present

## 2021-07-27 DIAGNOSIS — Z8249 Family history of ischemic heart disease and other diseases of the circulatory system: Secondary | ICD-10-CM

## 2021-07-27 DIAGNOSIS — F0390 Unspecified dementia without behavioral disturbance: Secondary | ICD-10-CM | POA: Diagnosis present

## 2021-07-27 DIAGNOSIS — N390 Urinary tract infection, site not specified: Secondary | ICD-10-CM | POA: Diagnosis present

## 2021-07-27 DIAGNOSIS — I110 Hypertensive heart disease with heart failure: Secondary | ICD-10-CM | POA: Diagnosis present

## 2021-07-27 DIAGNOSIS — E875 Hyperkalemia: Secondary | ICD-10-CM | POA: Diagnosis present

## 2021-07-27 DIAGNOSIS — R6520 Severe sepsis without septic shock: Secondary | ICD-10-CM

## 2021-07-27 DIAGNOSIS — A4152 Sepsis due to Pseudomonas: Principal | ICD-10-CM | POA: Diagnosis present

## 2021-07-27 DIAGNOSIS — Z89432 Acquired absence of left foot: Secondary | ICD-10-CM

## 2021-07-27 DIAGNOSIS — E785 Hyperlipidemia, unspecified: Secondary | ICD-10-CM | POA: Diagnosis present

## 2021-07-27 DIAGNOSIS — G9340 Encephalopathy, unspecified: Secondary | ICD-10-CM

## 2021-07-27 DIAGNOSIS — G4730 Sleep apnea, unspecified: Secondary | ICD-10-CM | POA: Diagnosis present

## 2021-07-27 DIAGNOSIS — I739 Peripheral vascular disease, unspecified: Secondary | ICD-10-CM

## 2021-07-27 DIAGNOSIS — I5032 Chronic diastolic (congestive) heart failure: Secondary | ICD-10-CM | POA: Diagnosis present

## 2021-07-27 DIAGNOSIS — G9341 Metabolic encephalopathy: Secondary | ICD-10-CM | POA: Diagnosis present

## 2021-07-27 DIAGNOSIS — Z7984 Long term (current) use of oral hypoglycemic drugs: Secondary | ICD-10-CM

## 2021-07-27 DIAGNOSIS — E1165 Type 2 diabetes mellitus with hyperglycemia: Secondary | ICD-10-CM | POA: Diagnosis present

## 2021-07-27 DIAGNOSIS — B9689 Other specified bacterial agents as the cause of diseases classified elsewhere: Secondary | ICD-10-CM | POA: Diagnosis present

## 2021-07-27 DIAGNOSIS — Z8042 Family history of malignant neoplasm of prostate: Secondary | ICD-10-CM

## 2021-07-27 DIAGNOSIS — Z79899 Other long term (current) drug therapy: Secondary | ICD-10-CM

## 2021-07-27 DIAGNOSIS — F32A Depression, unspecified: Secondary | ICD-10-CM | POA: Diagnosis present

## 2021-07-27 DIAGNOSIS — E872 Acidosis, unspecified: Secondary | ICD-10-CM | POA: Diagnosis present

## 2021-07-27 DIAGNOSIS — Z7982 Long term (current) use of aspirin: Secondary | ICD-10-CM

## 2021-07-27 DIAGNOSIS — Z20822 Contact with and (suspected) exposure to covid-19: Secondary | ICD-10-CM | POA: Diagnosis present

## 2021-07-27 DIAGNOSIS — Z794 Long term (current) use of insulin: Secondary | ICD-10-CM

## 2021-07-27 LAB — URINE DRUG SCREEN, QUALITATIVE (ARMC ONLY)
Amphetamines, Ur Screen: NOT DETECTED
Amphetamines, Ur Screen: NOT DETECTED
Barbiturates, Ur Screen: NOT DETECTED
Barbiturates, Ur Screen: NOT DETECTED
Benzodiazepine, Ur Scrn: NOT DETECTED
Benzodiazepine, Ur Scrn: POSITIVE — AB
Cannabinoid 50 Ng, Ur ~~LOC~~: NOT DETECTED
Cannabinoid 50 Ng, Ur ~~LOC~~: NOT DETECTED
Cocaine Metabolite,Ur ~~LOC~~: NOT DETECTED
Cocaine Metabolite,Ur ~~LOC~~: NOT DETECTED
MDMA (Ecstasy)Ur Screen: NOT DETECTED
MDMA (Ecstasy)Ur Screen: NOT DETECTED
Methadone Scn, Ur: NOT DETECTED
Methadone Scn, Ur: NOT DETECTED
Opiate, Ur Screen: NOT DETECTED
Opiate, Ur Screen: NOT DETECTED
Phencyclidine (PCP) Ur S: NOT DETECTED
Phencyclidine (PCP) Ur S: NOT DETECTED
Tricyclic, Ur Screen: NOT DETECTED
Tricyclic, Ur Screen: NOT DETECTED

## 2021-07-27 LAB — CBG MONITORING, ED
Glucose-Capillary: 136 mg/dL — ABNORMAL HIGH (ref 70–99)
Glucose-Capillary: 86 mg/dL (ref 70–99)

## 2021-07-27 LAB — URINALYSIS, COMPLETE (UACMP) WITH MICROSCOPIC
Bilirubin Urine: NEGATIVE
Glucose, UA: NEGATIVE mg/dL
Ketones, ur: NEGATIVE mg/dL
Nitrite: POSITIVE — AB
Protein, ur: 100 mg/dL — AB
Specific Gravity, Urine: 1.015 (ref 1.005–1.030)
pH: 5.5 (ref 5.0–8.0)

## 2021-07-27 LAB — COMPREHENSIVE METABOLIC PANEL
ALT: 33 U/L (ref 0–44)
AST: 42 U/L — ABNORMAL HIGH (ref 15–41)
Albumin: 3.4 g/dL — ABNORMAL LOW (ref 3.5–5.0)
Alkaline Phosphatase: 89 U/L (ref 38–126)
Anion gap: 5 (ref 5–15)
BUN: 31 mg/dL — ABNORMAL HIGH (ref 8–23)
CO2: 26 mmol/L (ref 22–32)
Calcium: 9.4 mg/dL (ref 8.9–10.3)
Chloride: 104 mmol/L (ref 98–111)
Creatinine, Ser: 1.32 mg/dL — ABNORMAL HIGH (ref 0.44–1.00)
GFR, Estimated: 43 mL/min — ABNORMAL LOW (ref >60)
Glucose, Bld: 126 mg/dL — ABNORMAL HIGH (ref 70–99)
Potassium: 4.6 mmol/L (ref 3.5–5.1)
Sodium: 135 mmol/L (ref 135–145)
Total Bilirubin: 0.5 mg/dL (ref 0.3–1.2)
Total Protein: 8 g/dL (ref 6.5–8.1)

## 2021-07-27 LAB — DIFFERENTIAL
Abs Immature Granulocytes: 0.03 10*3/uL (ref 0.00–0.07)
Basophils Absolute: 0 10*3/uL (ref 0.0–0.1)
Basophils Relative: 0 %
Eosinophils Absolute: 0.4 10*3/uL (ref 0.0–0.5)
Eosinophils Relative: 4 %
Immature Granulocytes: 0 %
Lymphocytes Relative: 31 %
Lymphs Abs: 3 10*3/uL (ref 0.7–4.0)
Monocytes Absolute: 0.7 10*3/uL (ref 0.1–1.0)
Monocytes Relative: 7 %
Neutro Abs: 5.5 10*3/uL (ref 1.7–7.7)
Neutrophils Relative %: 58 %

## 2021-07-27 LAB — CBC
HCT: 49.1 % — ABNORMAL HIGH (ref 36.0–46.0)
Hemoglobin: 16.1 g/dL — ABNORMAL HIGH (ref 12.0–15.0)
MCH: 29.6 pg (ref 26.0–34.0)
MCHC: 32.8 g/dL (ref 30.0–36.0)
MCV: 90.3 fL (ref 80.0–100.0)
Platelets: 298 10*3/uL (ref 150–400)
RBC: 5.44 MIL/uL — ABNORMAL HIGH (ref 3.87–5.11)
RDW: 13.6 % (ref 11.5–15.5)
WBC: 9.7 10*3/uL (ref 4.0–10.5)
nRBC: 0 % (ref 0.0–0.2)

## 2021-07-27 LAB — RESP PANEL BY RT-PCR (FLU A&B, COVID) ARPGX2
Influenza A by PCR: NEGATIVE
Influenza B by PCR: NEGATIVE
SARS Coronavirus 2 by RT PCR: NEGATIVE

## 2021-07-27 LAB — BRAIN NATRIURETIC PEPTIDE: B Natriuretic Peptide: 12 pg/mL (ref 0.0–100.0)

## 2021-07-27 LAB — TROPONIN I (HIGH SENSITIVITY)
Troponin I (High Sensitivity): 14 ng/L (ref <18)
Troponin I (High Sensitivity): 13 ng/L (ref <18)

## 2021-07-27 LAB — APTT: aPTT: 28 seconds (ref 24–36)

## 2021-07-27 LAB — PROTIME-INR
INR: 1 (ref 0.8–1.2)
Prothrombin Time: 13.6 seconds (ref 11.4–15.2)

## 2021-07-27 LAB — LACTIC ACID, PLASMA: Lactic Acid, Venous: 3.4 mmol/L (ref 0.5–1.9)

## 2021-07-27 LAB — TSH: TSH: 6.743 u[IU]/mL — ABNORMAL HIGH (ref 0.350–4.500)

## 2021-07-27 LAB — T4, FREE: Free T4: 0.9 ng/dL (ref 0.61–1.12)

## 2021-07-27 IMAGING — CT CT ANGIO CHEST-ABD-PELV FOR DISSECTION W/ AND WO/W CM
2 of 7 series · 15 of 46 positions shown, 17 images · IV contrast (APPLIED)
Comparison: CT abdomen [DATE]

CLINICAL DATA: Abdominal pain, aortic dissection suspected

EXAM:
CT ANGIOGRAPHY CHEST, ABDOMEN AND PELVIS
TECHNIQUE: Non-contrast CT of the chest was initially obtained.

[Series 5: axial arterial · axial · arterial · 0.83mm/px · z∈[-468,+90]mm · 12 of 214 slices shown, 14 images]
[im 14/214  soft-tissue]
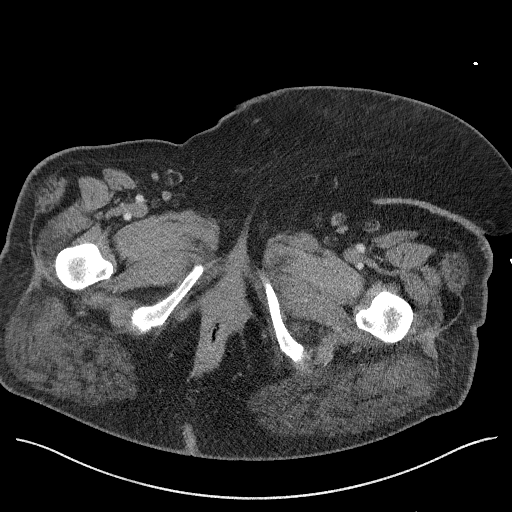
[im 14/214  bone]
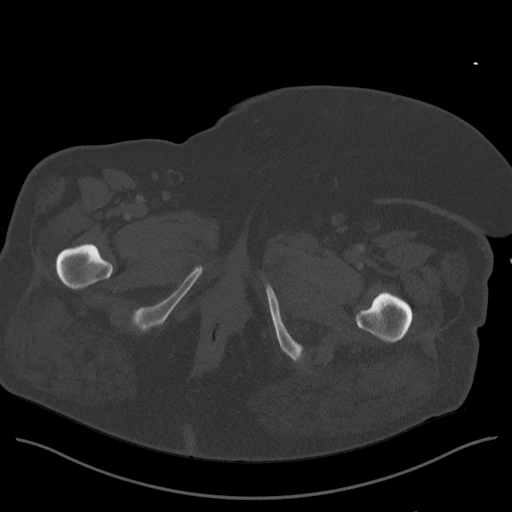
[im 27/214  soft-tissue]
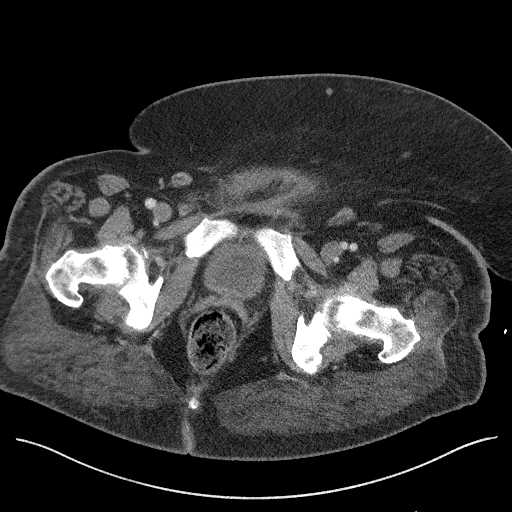
[im 54/214  soft-tissue]
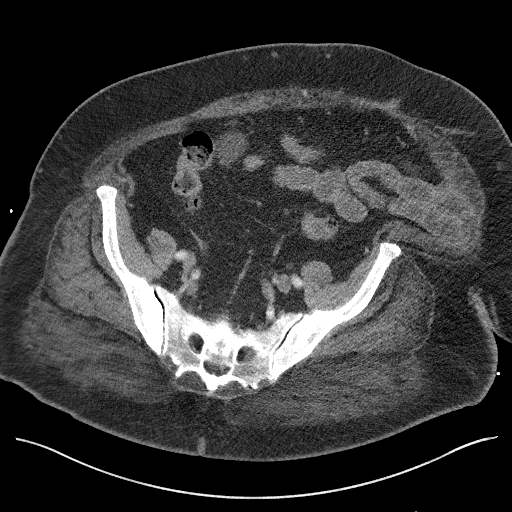
[im 67/214  soft-tissue]
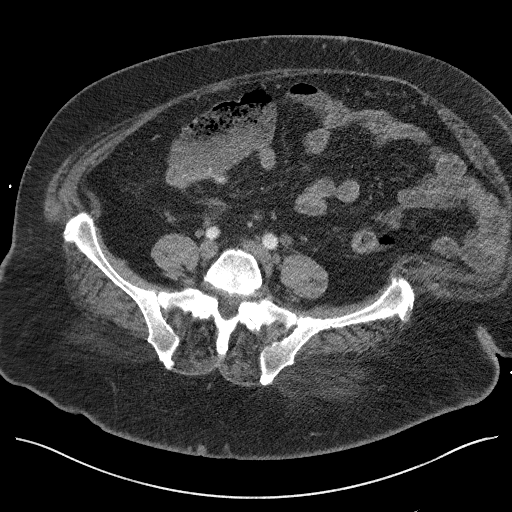
[im 80/214  soft-tissue]
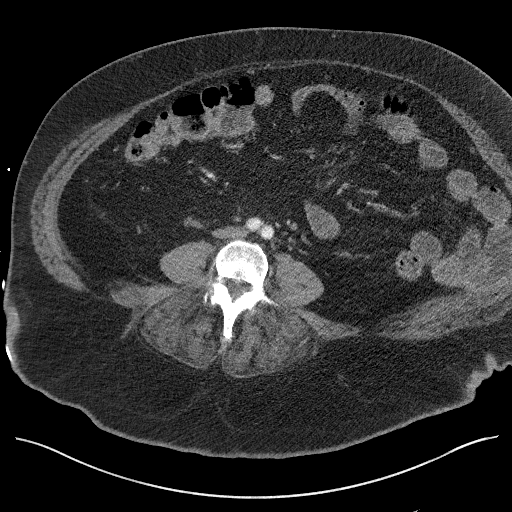
[im 94/214  soft-tissue]
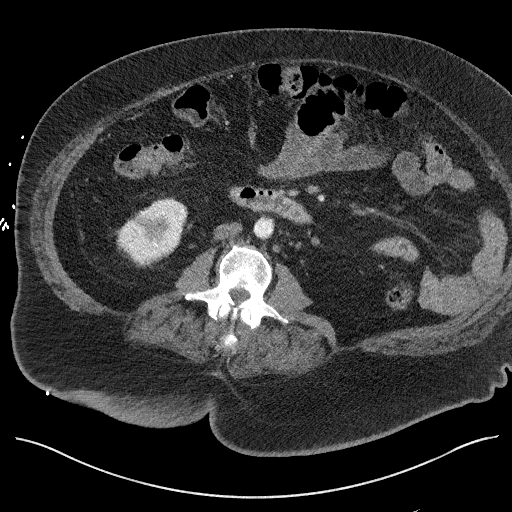
[im 120/214  soft-tissue]
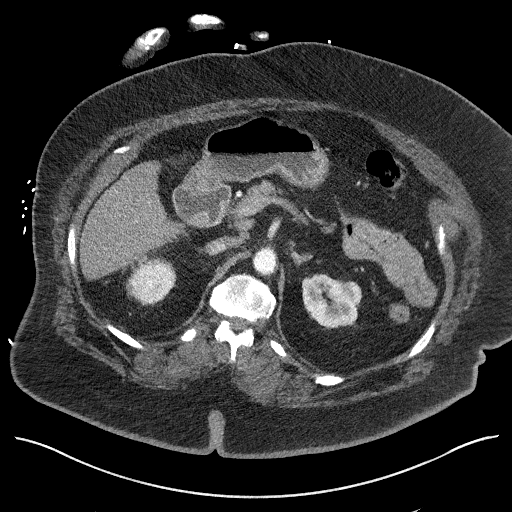
[im 134/214  soft-tissue]
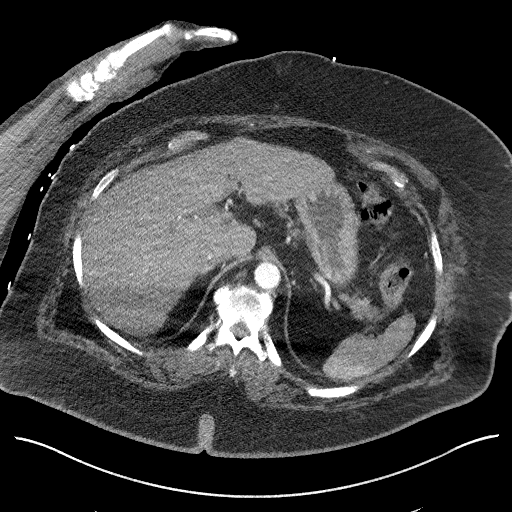
[im 147/214  soft-tissue]
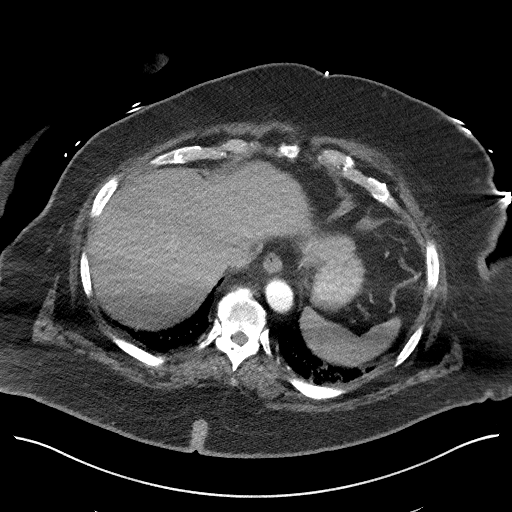
[im 147/214  bone]
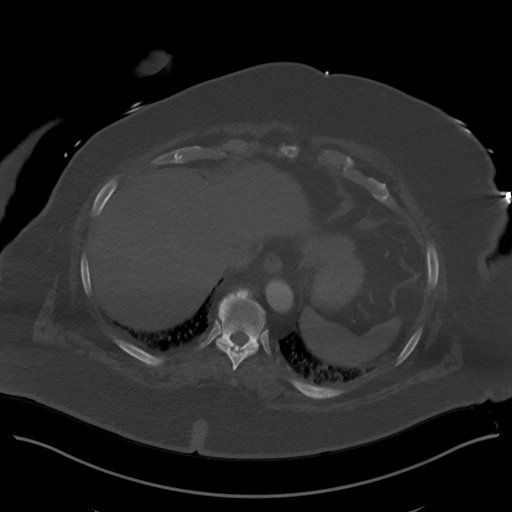
[im 160/214  soft-tissue]
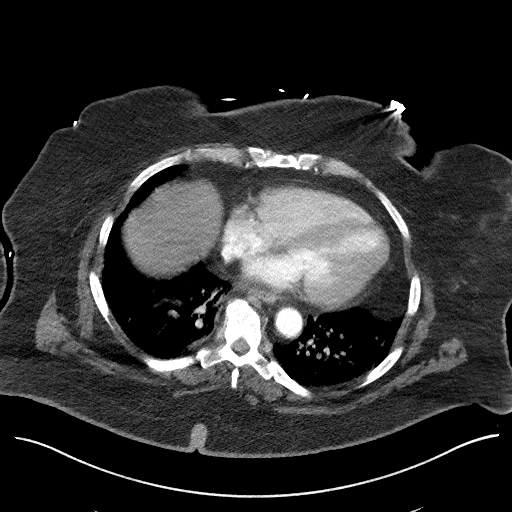
[im 187/214  soft-tissue]
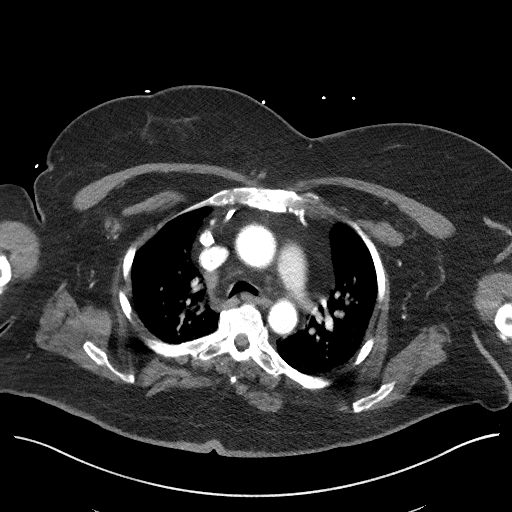
[im 200/214  soft-tissue]
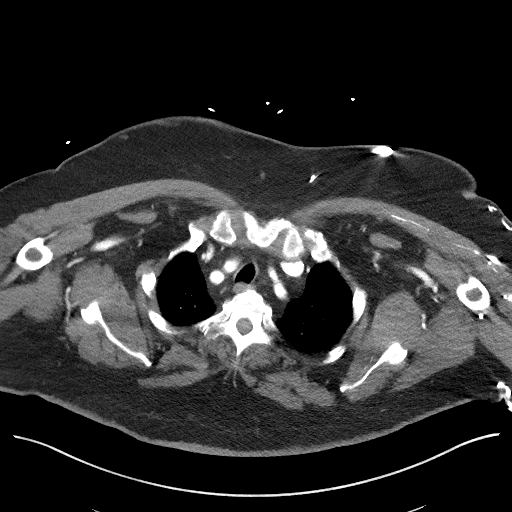

[Series 8: coronals · coronal · 0.97mm/px · 3 of 189 slices shown]
[im 48/189  soft-tissue]
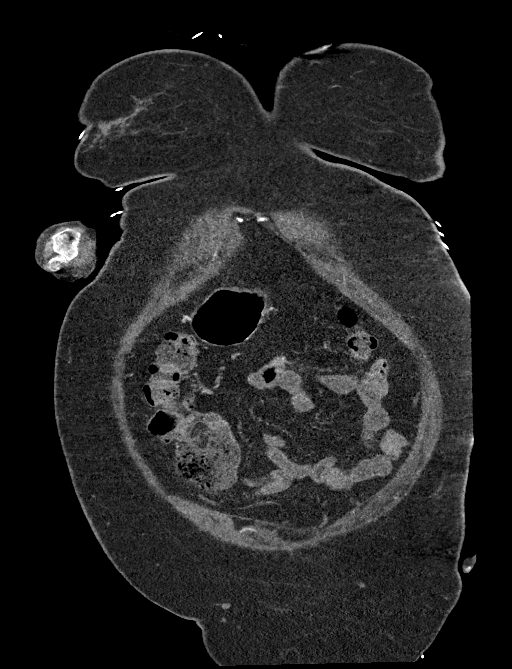
[im 95/189  soft-tissue]
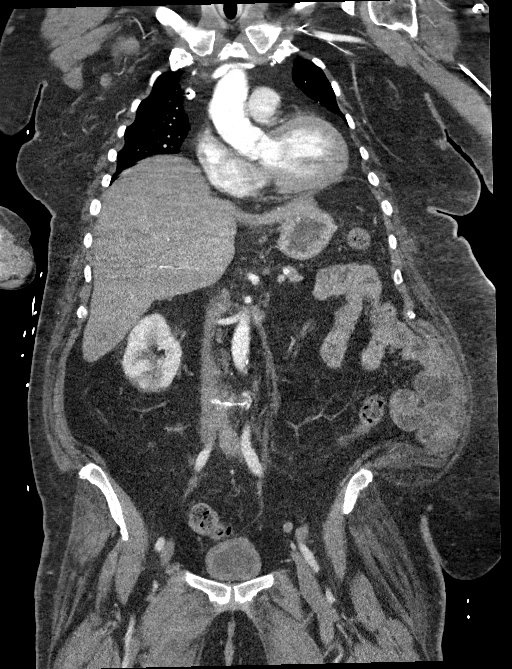
[im 142/189  soft-tissue]
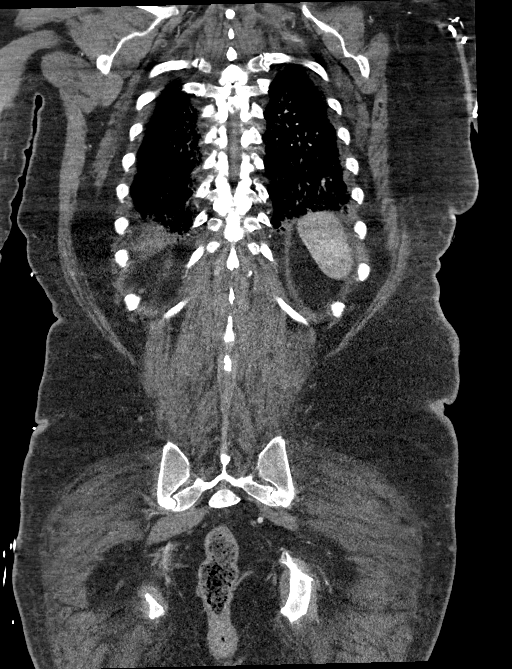

[15 of 46 positions shown; findings below may reference images not displayed]

Multidetector CT imaging through the chest, abdomen and pelvis was
performed using the standard protocol during bolus administration of
intravenous contrast. Multiplanar reconstructed images and MIPs were
obtained and reviewed to evaluate the vascular anatomy.

CONTRAST:  80mL OMNIPAQUE IOHEXOL 350 MG/ML SOLN
FINDINGS: CTA CHEST FINDINGS

Cardiovascular: Thoracic aorta is normal in caliber without evidence
of intramural hematoma or dissection. Normal heart size. No
pericardial effusion.

Mediastinum/Nodes: No enlarged nodes. Imaged thyroid is
unremarkable.

Lungs/Pleura: Imaged in expiration with patchy atelectasis
bilaterally. No pleural effusion or pneumothorax.

Musculoskeletal: Degenerative changes of the included spine.

Review of the MIP images confirms the above findings.

CTA ABDOMEN AND PELVIS FINDINGS

VASCULAR

Aorta: Normal caliber aorta without aneurysm, dissection, vasculitis
or significant stenosis. Mild atherosclerotic plaque.

Celiac: Patent.  No significant origin stenosis.

SMA: Patent.  No significant origin stenosis.

Renals: Patent.  No significant origin stenosis.

IMA: Patent.  No significant origin stenosis.

Inflow: Patent.  Normal in caliber.

Veins: Not well evaluated.

Review of the MIP images confirms the above findings.

NON-VASCULAR

Hepatobiliary: No focal liver abnormality is seen. Status post
cholecystectomy. No biliary dilatation.

Pancreas: Unremarkable.

Spleen: Unremarkable.

Adrenals/Urinary Tract: Adrenals are unremarkable. There is a new
small area of hypoenhancement at the lower pole of the right kidney.
Unchanged left renal scarring with atrophy. Similar circumferential
bladder wall thickening.

Stomach/Bowel: Stomach is within normal limits. Bowel is normal in
caliber. Normal appendix.

Lymphatic: No enlarged nodes.

Reproductive: Status post hysterectomy. No adnexal masses.

Other: No free fluid. No acute abnormality of the abdominal wall.
Left lateral abdominal wall is partially excluded.

Musculoskeletal: Degenerative changes of the included spine.
Degenerative changes of the hips.

Review of the MIP images confirms the above findings.
IMPRESSION: No evidence of aortic dissection.

Small area of hypoenhancement at the lower pole of the right kidney
suspicious for pyelonephritis. Circumferential bladder wall
thickening may reflect cystitis or under distension.

Mild aortic atherosclerosis.

## 2021-07-27 IMAGING — DX DG CHEST 1V PORT
3 series · 3 of 3 positions shown · non-contrast
Comparison: Chest x-ray dated [DATE]

CLINICAL DATA: Altered mental status

EXAM:
PORTABLE CHEST 1 VIEW

[chest ap (1 of 3)]
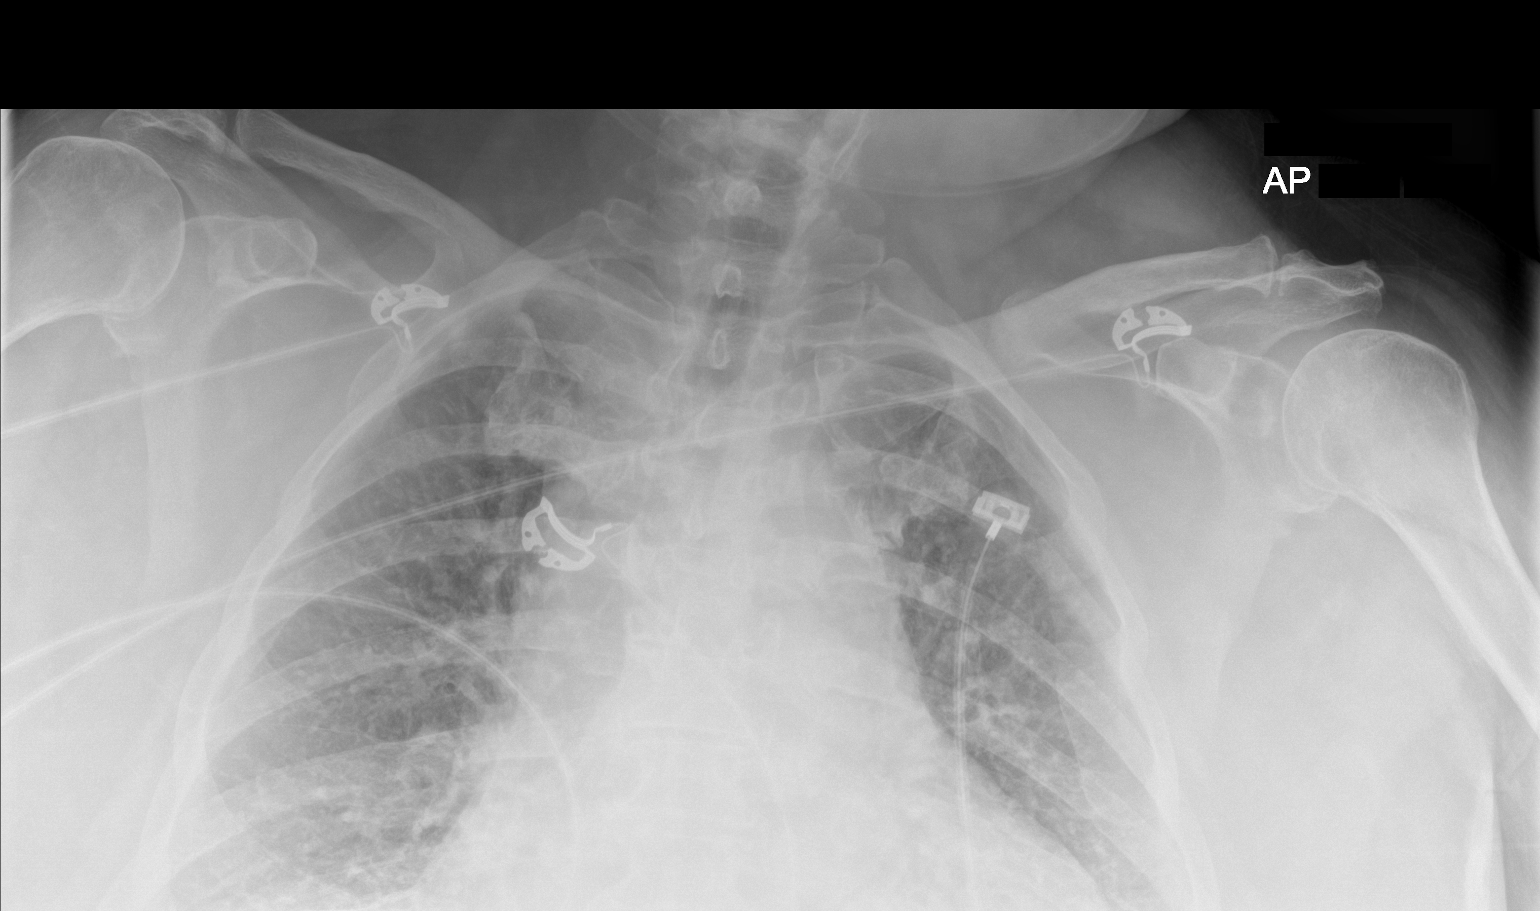

[chest ap (2 of 3)]
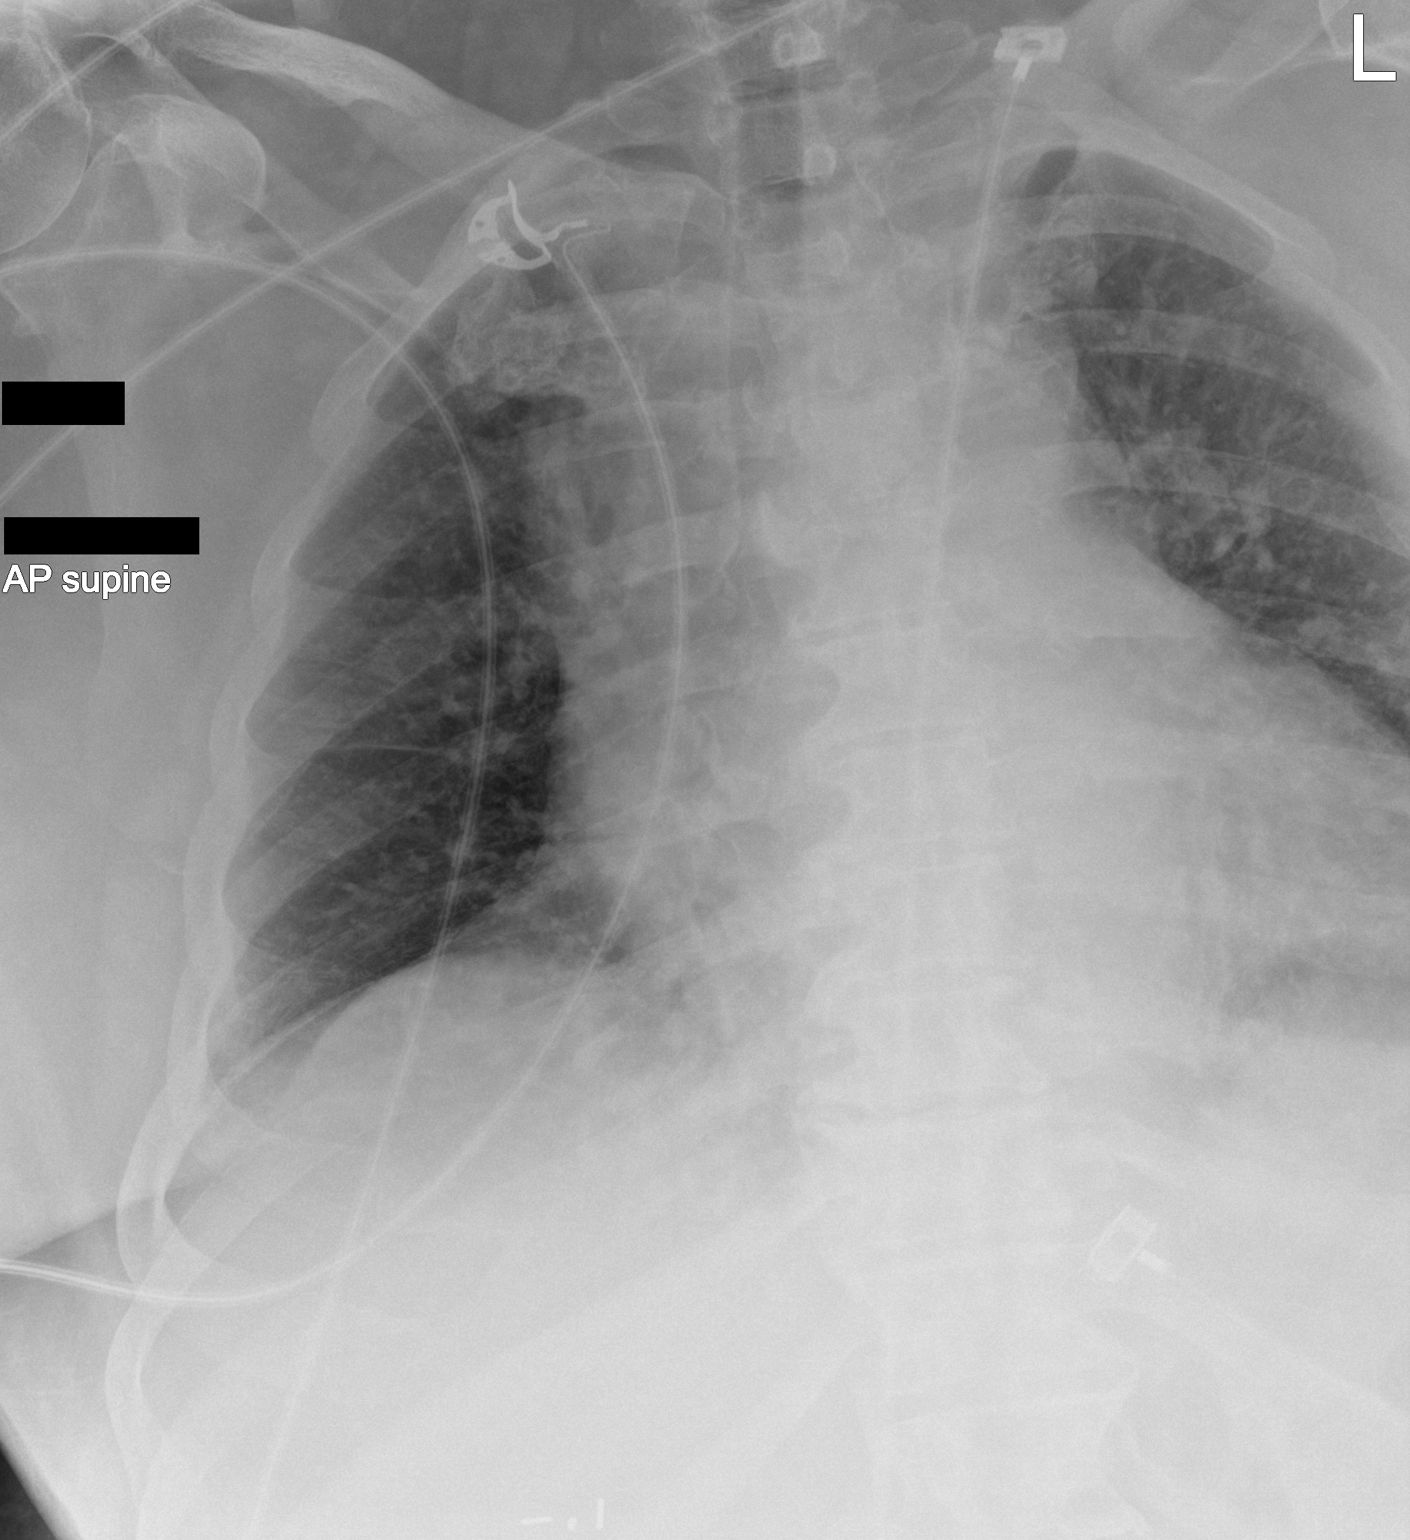

[chest ap (3 of 3)]
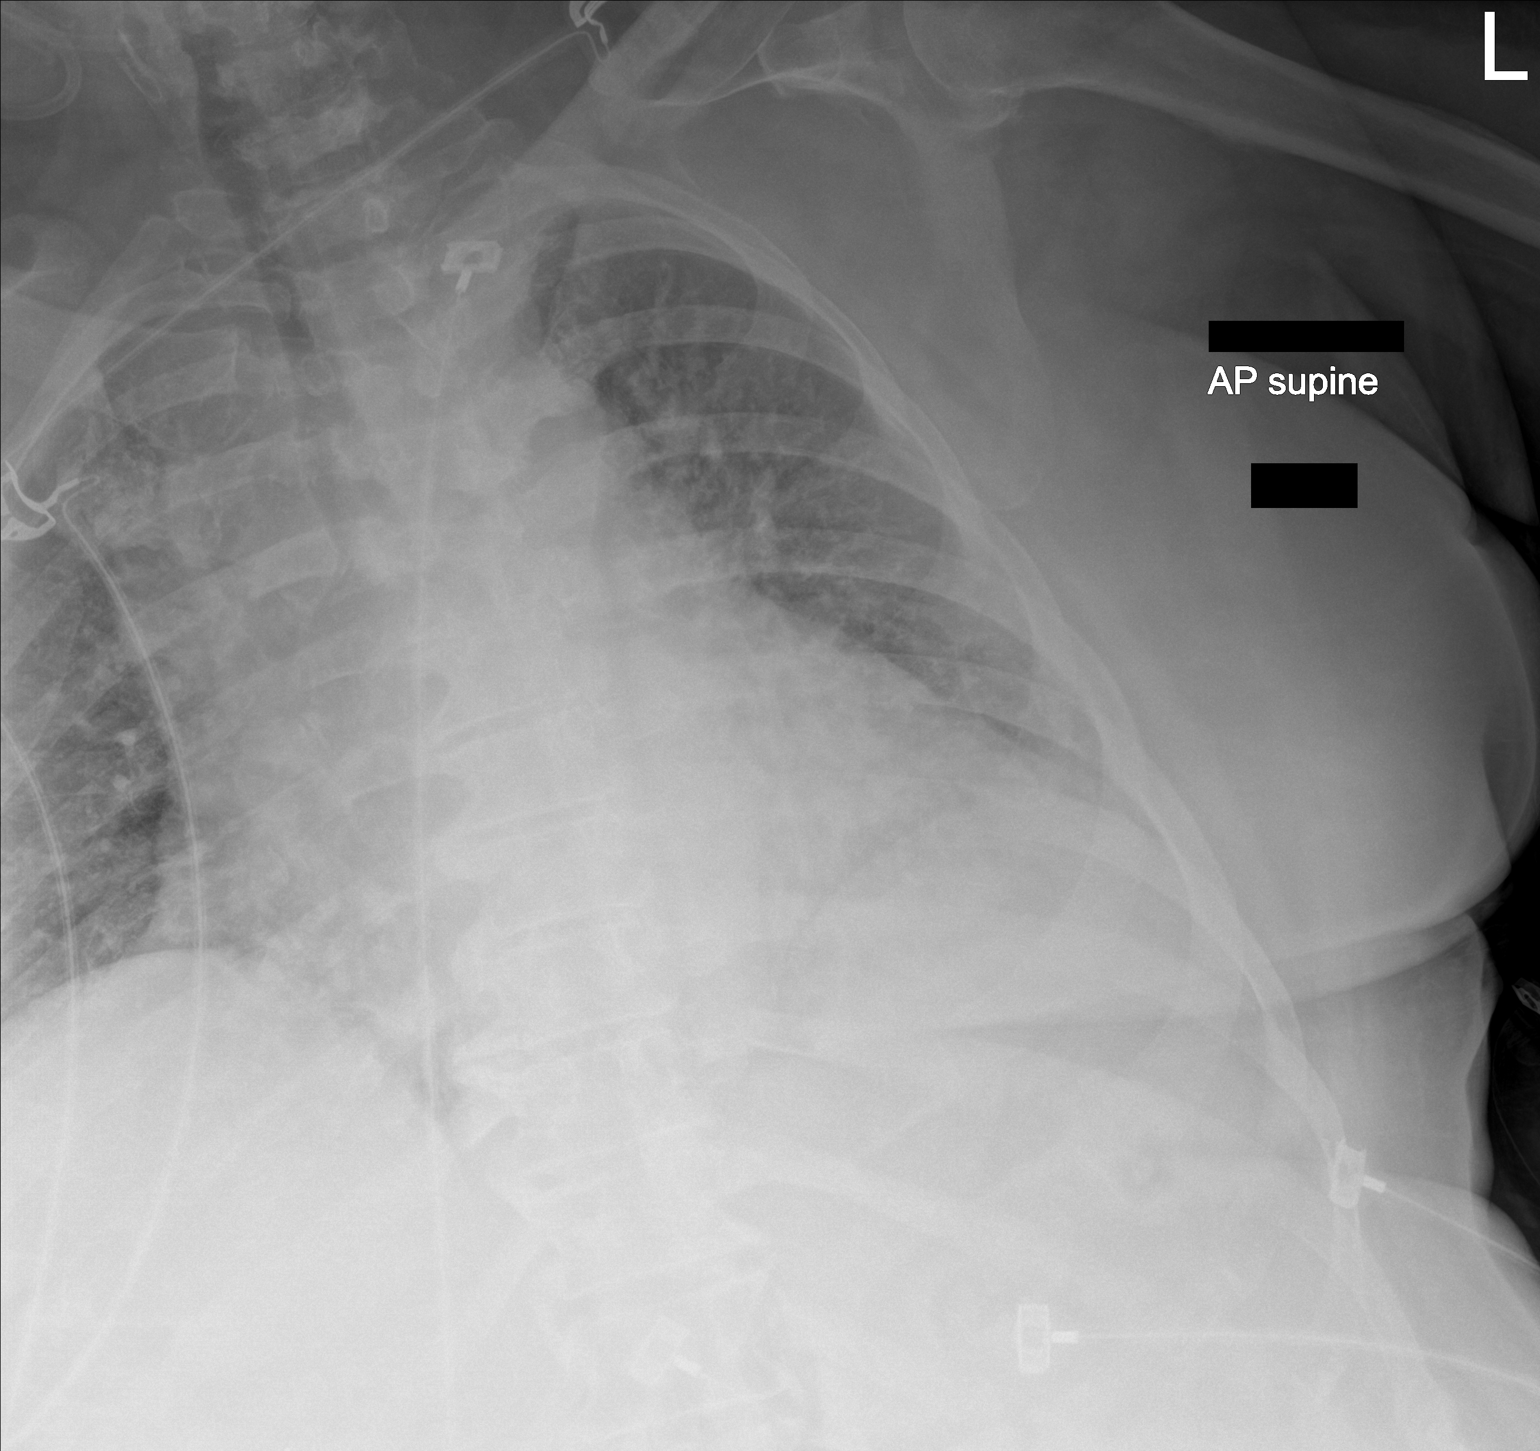

[3 of 3 positions shown; findings below may reference images not displayed]

FINDINGS: Unchanged enlarged cardiac and mediastinal contours. Mild bilateral
heterogeneous opacities. No large pleural effusion or pneumothorax.
IMPRESSION: Mild bilateral heterogeneous opacities, possibly due to pulmonary
edema. SP

## 2021-07-27 IMAGING — CT CT HEAD W/O CM
4 series · 16 of 47 positions shown, 18 images · non-contrast
Comparison: CT head dated [DATE]

CLINICAL DATA: Neurological deficit

EXAM:
CT HEAD WITHOUT CONTRAST
TECHNIQUE: Contiguous axial images were obtained from the base of the skull
through the vertex without intravenous contrast.

[Series 2: head bone · axial · 0.41mm/px · z∈[-210,-178]mm · 3 of 82 slices shown]
[im 9/82  bone]
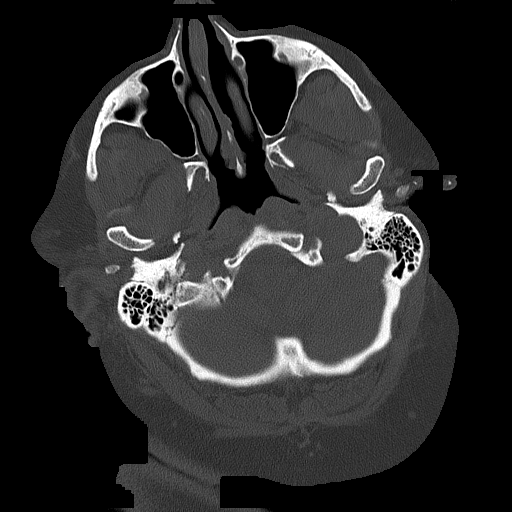
[im 17/82  bone]
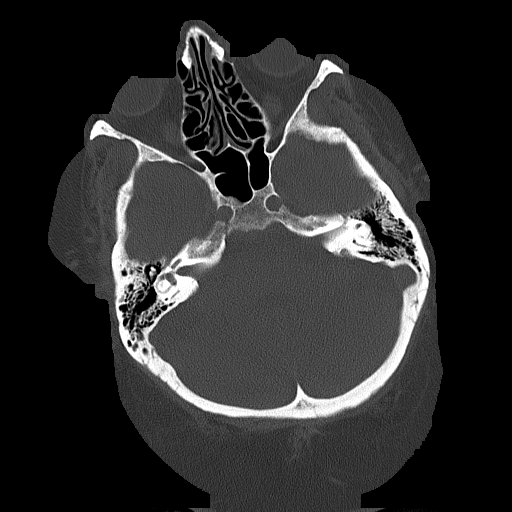
[im 25/82  bone]
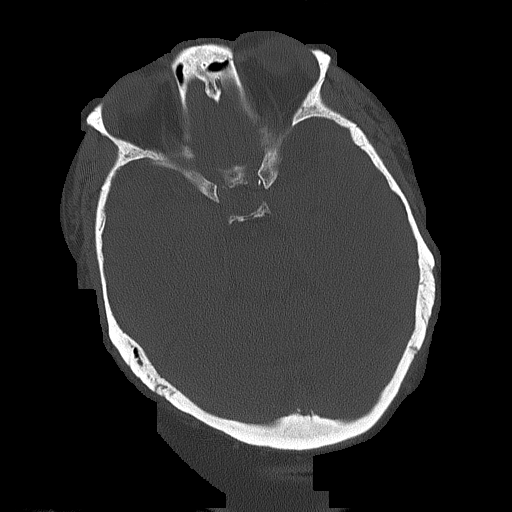

[Series 3: head wo · axial · 0.41mm/px · z∈[-206,-86]mm · 7 of 33 slices shown, 9 images]
[im 5/33  brain]
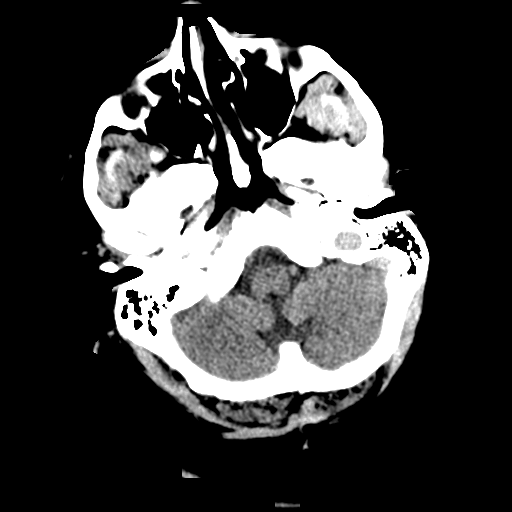
[im 5/33  bone]
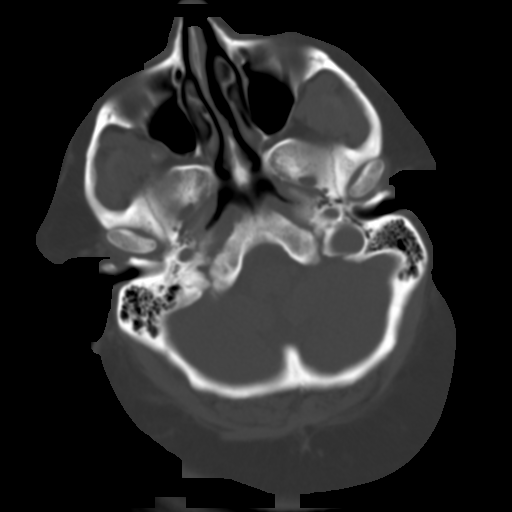
[im 9/33  brain]
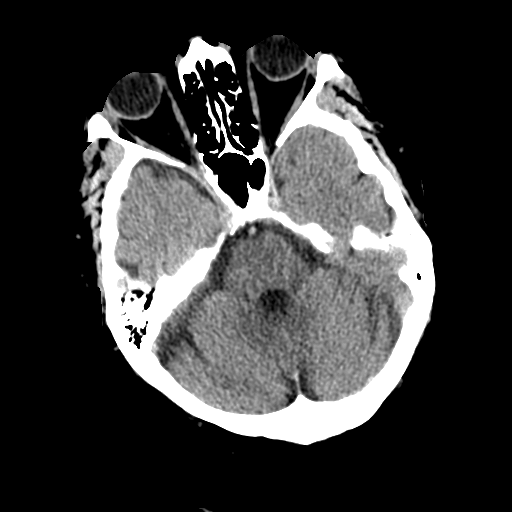
[im 13/33  brain]
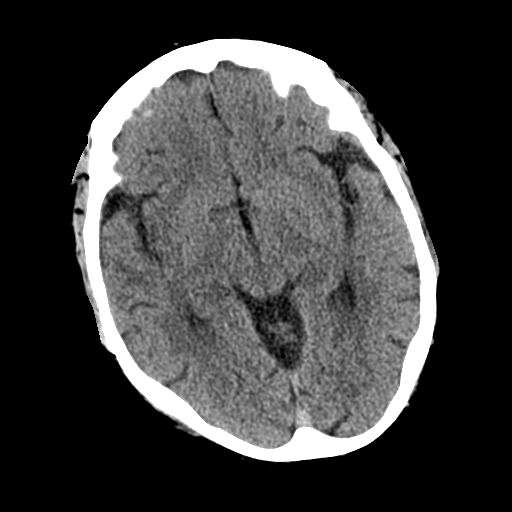
[im 17/33  brain]
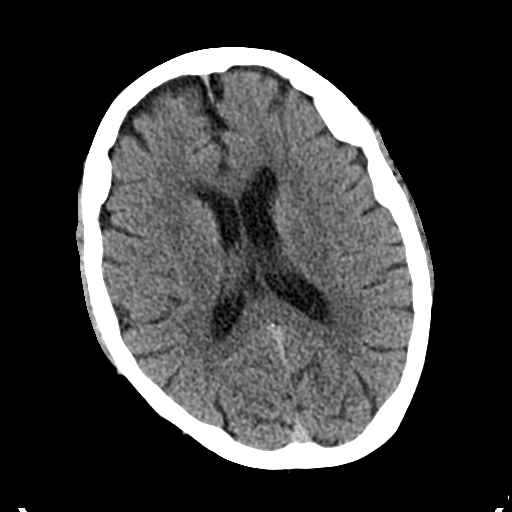
[im 21/33  brain]
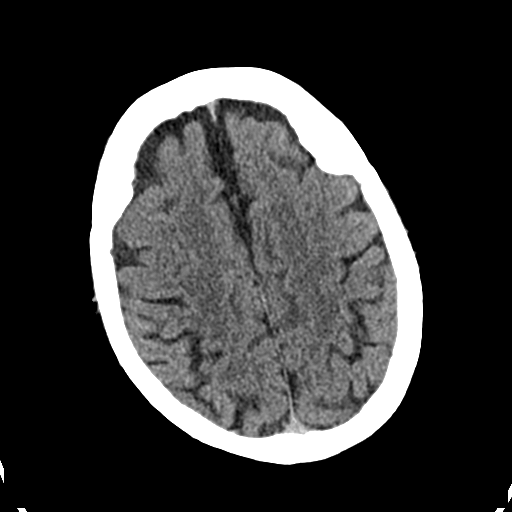
[im 21/33  bone]
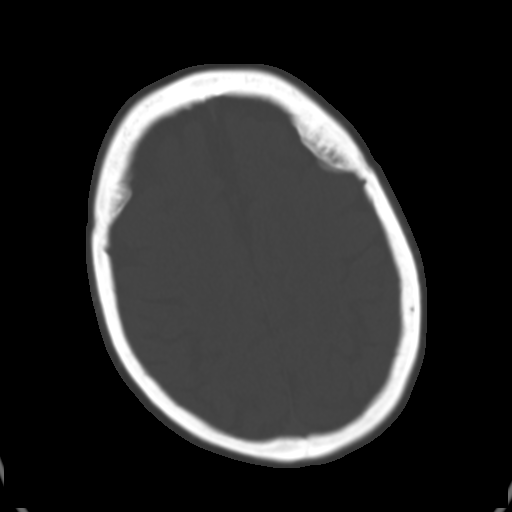
[im 25/33  brain]
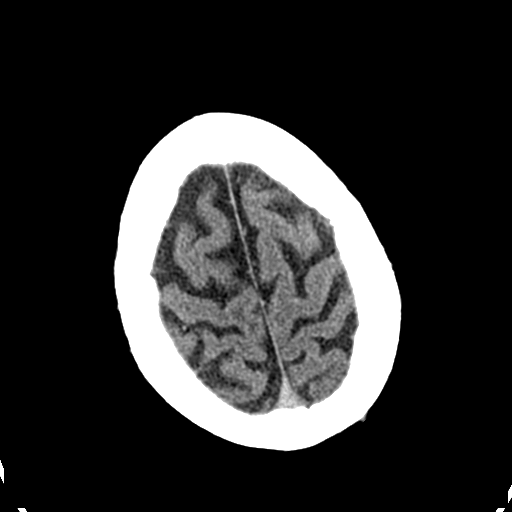
[im 29/33  brain]
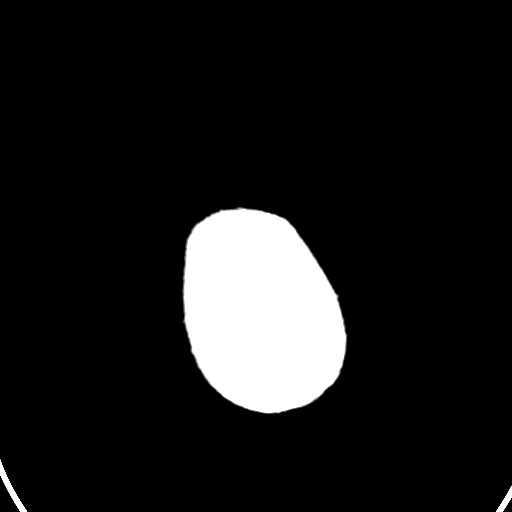

[Series 4: coronal soft tissue · coronal · 0.32mm/px · 3 of 75 slices shown]
[im 25/75  brain]
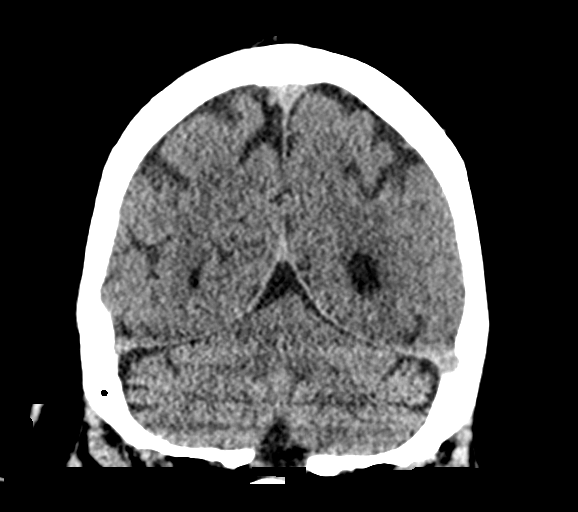
[im 33/75  brain]
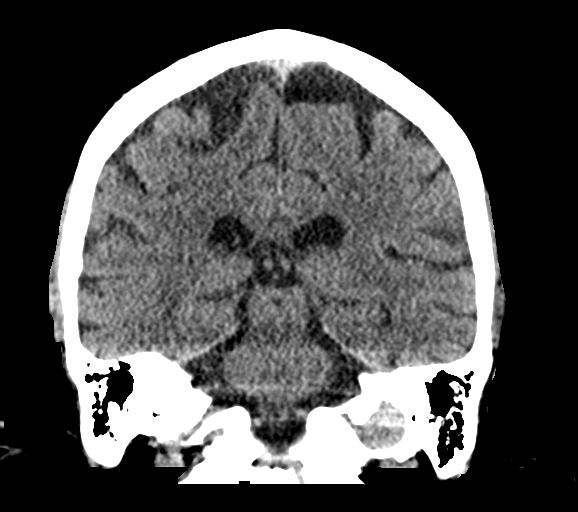
[im 42/75  brain]
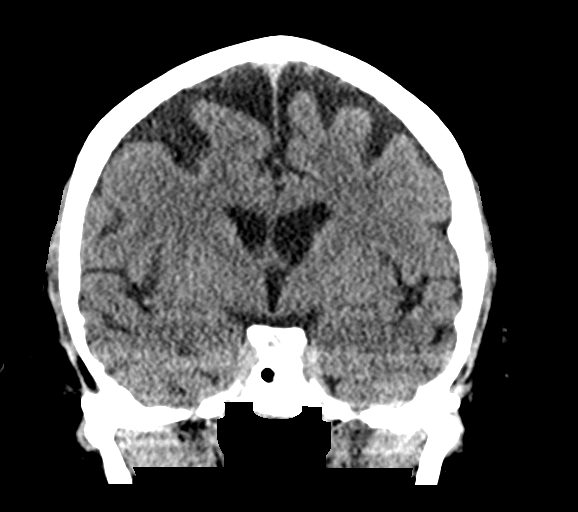

[Series 5: sagittal soft tissue · sagittal · 0.32mm/px · 3 of 62 slices shown]
[im 21/62  brain]
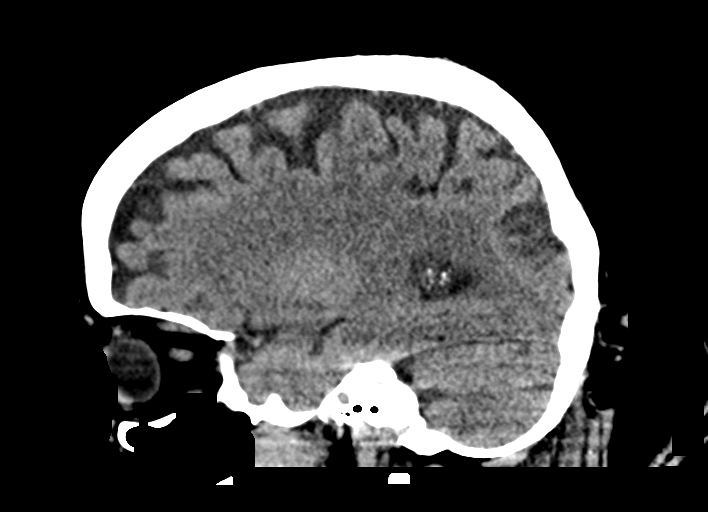
[im 31/62  brain]
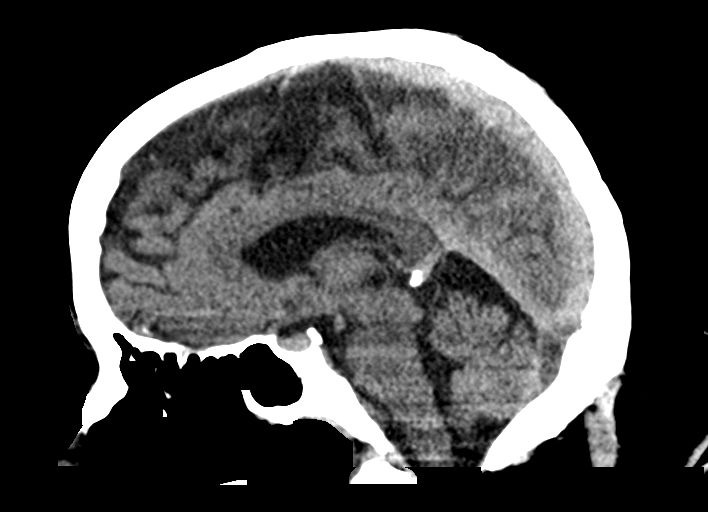
[im 41/62  brain]
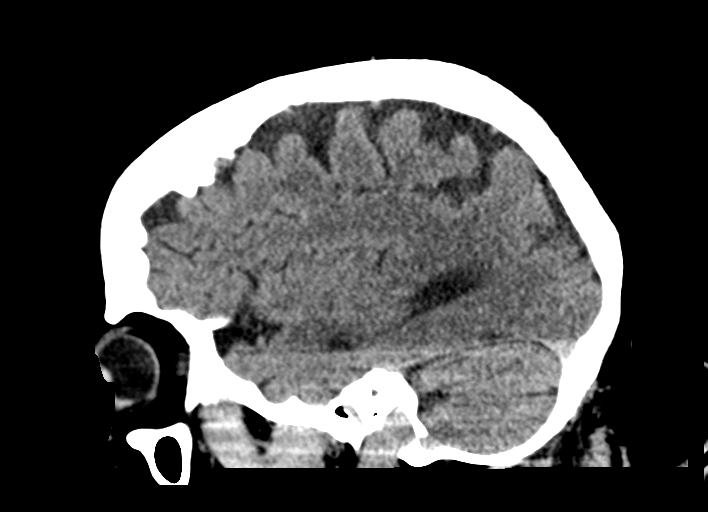

[16 of 47 positions shown; findings below may reference images not displayed]

FINDINGS: Brain: Chronic white matter ischemic change. No evidence of acute
infarction, hemorrhage, hydrocephalus, extra-axial collection or
mass lesion/mass effect.

Vascular: No hyperdense vessel or unexpected calcification.

Skull: Normal. Negative for fracture or focal lesion.

Sinuses/Orbits: No acute finding.

Other: None.
IMPRESSION: No acute intracranial abnormality.

## 2021-07-27 MED ORDER — SODIUM CHLORIDE 0.9 % IV SOLN
2.0000 g | Freq: Once | INTRAVENOUS | Status: AC
  Administered 2021-07-27: 2 g via INTRAVENOUS
  Filled 2021-07-27: qty 2

## 2021-07-27 MED ORDER — ACETAMINOPHEN 325 MG PO TABS
650.0000 mg | ORAL_TABLET | Freq: Four times a day (QID) | ORAL | Status: DC | PRN
  Administered 2021-08-01: 650 mg via ORAL
  Filled 2021-07-27: qty 2

## 2021-07-27 MED ORDER — ACETAMINOPHEN 650 MG RE SUPP: 650.0000 mg | Freq: Four times a day (QID) | RECTAL | Status: DC | PRN

## 2021-07-27 MED ORDER — ASPIRIN EC 81 MG PO TBEC
81.0000 mg | DELAYED_RELEASE_TABLET | Freq: Every day | ORAL | Status: DC
  Administered 2021-07-28 – 2021-08-02 (×6): 81 mg via ORAL
  Filled 2021-07-27 (×6): qty 1

## 2021-07-27 MED ORDER — MIDAZOLAM HCL 2 MG/2ML IJ SOLN
INTRAMUSCULAR | Status: AC
  Administered 2021-07-27: 1 mg via INTRAVENOUS
  Filled 2021-07-27: qty 2

## 2021-07-27 MED ORDER — SODIUM CHLORIDE 0.9 % IV SOLN: INTRAVENOUS | Status: DC

## 2021-07-27 MED ORDER — VANCOMYCIN HCL 2000 MG/400ML IV SOLN
2000.0000 mg | Freq: Once | INTRAVENOUS | Status: AC
  Administered 2021-07-27: 2000 mg via INTRAVENOUS
  Filled 2021-07-27: qty 400

## 2021-07-27 MED ORDER — STERILE WATER FOR INJECTION IJ SOLN
INTRAMUSCULAR | Status: AC
  Administered 2021-07-27: 1.2 mL
  Filled 2021-07-27: qty 10

## 2021-07-27 MED ORDER — HEPARIN SODIUM (PORCINE) 5000 UNIT/ML IJ SOLN
5000.0000 [IU] | Freq: Three times a day (TID) | INTRAMUSCULAR | Status: DC
  Administered 2021-07-27 – 2021-07-28 (×2): 5000 [IU] via SUBCUTANEOUS
  Filled 2021-07-27 (×2): qty 1

## 2021-07-27 MED ORDER — ONDANSETRON HCL 4 MG/2ML IJ SOLN: 4.0000 mg | Freq: Four times a day (QID) | INTRAMUSCULAR | Status: DC | PRN

## 2021-07-27 MED ORDER — NYSTATIN 100000 UNIT/GM EX POWD
Freq: Two times a day (BID) | CUTANEOUS | Status: AC
  Filled 2021-07-27: qty 15

## 2021-07-27 MED ORDER — SODIUM CHLORIDE 0.9 % IV BOLUS
1000.0000 mL | Freq: Once | INTRAVENOUS | Status: DC
  Administered 2021-07-27: 1000 mL via INTRAVENOUS

## 2021-07-27 MED ORDER — ONDANSETRON HCL 4 MG PO TABS: 4.0000 mg | ORAL_TABLET | Freq: Four times a day (QID) | ORAL | Status: DC | PRN

## 2021-07-27 MED ORDER — MIDAZOLAM HCL 2 MG/2ML IJ SOLN
2.0000 mg | Freq: Once | INTRAMUSCULAR | Status: AC
  Administered 2021-07-27: 2 mg via INTRAVENOUS
  Filled 2021-07-27: qty 2

## 2021-07-27 MED ORDER — HALOPERIDOL LACTATE 5 MG/ML IJ SOLN
5.0000 mg | Freq: Once | INTRAMUSCULAR | Status: DC
  Filled 2021-07-27: qty 1

## 2021-07-27 MED ORDER — MIDAZOLAM HCL 2 MG/2ML IJ SOLN: 2.0000 mg | Freq: Once | INTRAMUSCULAR | Status: DC

## 2021-07-27 MED ORDER — SODIUM CHLORIDE 0.9 % IV BOLUS
500.0000 mL | Freq: Once | INTRAVENOUS | Status: AC
  Administered 2021-07-27: 500 mL via INTRAVENOUS

## 2021-07-27 MED ORDER — IOHEXOL 350 MG/ML SOLN
80.0000 mL | Freq: Once | INTRAVENOUS | Status: AC | PRN
  Administered 2021-07-27: 80 mL via INTRAVENOUS

## 2021-07-27 MED ORDER — HALOPERIDOL LACTATE 5 MG/ML IJ SOLN: 5.0000 mg | Freq: Once | INTRAMUSCULAR | Status: AC

## 2021-07-27 MED ORDER — SIMVASTATIN 20 MG PO TABS
40.0000 mg | ORAL_TABLET | Freq: Every morning | ORAL | Status: DC
  Administered 2021-07-28 – 2021-08-02 (×6): 40 mg via ORAL
  Filled 2021-07-27 (×5): qty 2
  Filled 2021-07-27: qty 4

## 2021-07-27 MED ORDER — MIDAZOLAM HCL 2 MG/2ML IJ SOLN: 1.0000 mg | Freq: Once | INTRAMUSCULAR | Status: AC

## 2021-07-27 MED ORDER — HALOPERIDOL LACTATE 5 MG/ML IJ SOLN
INTRAMUSCULAR | Status: AC
  Administered 2021-07-27: 5 mg via INTRAMUSCULAR
  Filled 2021-07-27: qty 1

## 2021-07-27 MED ORDER — SODIUM CHLORIDE 0.9 % IV SOLN
2.0000 g | INTRAVENOUS | Status: DC
  Administered 2021-07-28 – 2021-07-29 (×2): 2 g via INTRAVENOUS
  Filled 2021-07-27: qty 2
  Filled 2021-07-27: qty 20

## 2021-07-27 MED ORDER — HALOPERIDOL LACTATE 5 MG/ML IJ SOLN
5.0000 mg | Freq: Once | INTRAMUSCULAR | Status: AC
  Administered 2021-07-27: 5 mg via INTRAMUSCULAR

## 2021-07-27 MED ORDER — ZIPRASIDONE MESYLATE 20 MG IM SOLR
20.0000 mg | Freq: Once | INTRAMUSCULAR | Status: AC
  Administered 2021-07-27: 20 mg via INTRAMUSCULAR
  Filled 2021-07-27: qty 20

## 2021-07-27 MED ORDER — VANCOMYCIN HCL IN DEXTROSE 1-5 GM/200ML-% IV SOLN: 1000.0000 mg | INTRAVENOUS | Status: DC

## 2021-07-27 MED ORDER — QUETIAPINE FUMARATE 25 MG PO TABS: 25.0000 mg | ORAL_TABLET | Freq: Every day | ORAL | Status: DC

## 2021-07-27 MED ORDER — DEXTROSE 50 % IV SOLN: 1.0000 | Freq: Once | INTRAVENOUS | Status: DC

## 2021-07-27 NOTE — ED Provider Notes (Signed)
[facility] Emergency Department Provider Note    Event Date/Time   First MD Initiated Contact with Patient 07/27/21 1314     (approximate)  I have reviewed the triage vital signs and the nursing notes.   HISTORY  Chief Complaint Weakness  Level V caveat:  agitation  HPI Judith Shaw is a 69 y.o. female below listed past medical history presents to the ER for evaluation of tremors with mass and decline over the past several weeks and days.  Having poor p.o. intake.  Does take Seroquel as needed for agitated.  On arrival to the ER patient got clearly very agitated swinging at staff uncooperative speaking in incomprehensible sentences.  Caregiver tearful and says that she never acts like this and has been steadily declining over the past several days.  Also has reported some abdominal pain and back pain.  Has noted some discoloration to the bilateral feet which is new.  Did have reported chest pain.  Past Medical History:  Diagnosis Date   Anxiety    Cancer ([place])    pt states hx of uterine cancer and had a complete hyst   CHF (congestive heart failure) (HCC)    Depression    Diabetes mellitus without complication ([place])    Hyperlipidemia    Hypertension    Family History  Problem Relation Age of Onset   Hypertension Mother    Heart disease Father    Prostate cancer Brother    Diabetes Maternal Aunt    Kidney cancer Neg Hx    Bladder Cancer Neg Hx    Past Surgical History:  Procedure Laterality Date   ABDOMINAL HYSTERECTOMY     AMPUTATION Left 12/19/2019   Procedure: AMPUTATION RAY Left 5th;  Surgeon: Caroline More, DPM;  Location: [facility] ORS;  Service: Podiatry;  Laterality: Left;   CHOLECYSTECTOMY     LOWER EXTREMITY ANGIOGRAPHY Left 12/17/2019   Procedure: Lower Extremity Angiography;  Surgeon: Algernon Huxley, MD;  Location: [facility];  Service: Cardiovascular;  Laterality: Left;   Patient Active Problem List   Diagnosis Date Noted    Atherosclerosis of native arteries of the extremities with ulceration ([place]) 02/24/2020   S/P amputation of lesser toe, left ([place]) 01/13/2020   Dementia without behavioral disturbance ([place]) 12/16/2019   Acute osteomyelitis of left foot ([place]) 12/15/2019   Memory impairment 11/30/2019   Complaints of memory disturbance 11/20/2019   Diabetic ulcer of left midfoot associated with type 2 diabetes mellitus ([place]) 11/18/2019   Seizure-like activity ([place]) 07/15/2019   Intractable 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 06/19/2019   Uncontrolled type 2 diabetes mellitus with hyperglycemia, with long-term current use of insulin ([place]) 04/22/2019   Panic attacks 03/26/2019   Benign essential HTN 07/19/2018   Fatty liver 05/15/2018   Hx of adenomatous colonic polyps 05/15/2018   Type 2 diabetes mellitus with diabetic nephropathy, with long-term current use of insulin ([place]) 05/01/2018   Tinnitus, bilateral 04/05/2017   Concussion syndrome 04/03/2017   Concussion without loss of consciousness 01/24/2017   Difficulty walking 01/24/2017   Headache disorder 01/24/2017   Numbness and tingling 01/24/2017   Postural urinary incontinence 01/24/2017   Sepsis ([place]) 09/21/2016   UTI (urinary tract infection) 09/21/2016   HTN (hypertension) 09/21/2016   Diabetes ([place]) 09/21/2016   Depression 09/21/2016   Health care maintenance 10/05/2015   Recurrent major depressive disorder, in full remission ([place]) 06/16/2014   DM (diabetes mellitus) type II controlled, neurological manifestation ([place])  06/12/2014   Morbid obesity ([place]) 06/12/2014   Hyperlipidemia 03/10/2014   Chronic diastolic CHF (congestive heart failure) ([place]) 25/42/7062   H/O diastolic dysfunction 37/62/8315   Sleep apnea 03/10/2014   SOB (shortness of breath) 03/10/2014      Prior to Admission medications   Medication Sig Start Date End Date Taking? Authorizing Provider  acetaminophen (TYLENOL) 325 MG tablet Take 2 tablets  (650 mg total) by mouth every 6 (six) hours as needed. 07/18/21   Carrie Mew, MD  aluminum-magnesium hydroxide-simethicone (MAALOX) 200-200-20 MG/5ML SUSP Take 30 mLs by mouth 4 (four) times daily -  before meals and at bedtime. 07/18/21   Carrie Mew, MD  amoxicillin-clavulanate (AUGMENTIN) 875-125 MG tablet Take 1 tablet by mouth 2 (two) times daily. 12/24/19   Jennye Boroughs, MD  aspirin EC 81 MG tablet Take 81 mg by mouth daily.    [provider]  Baclofen 5 MG TABS Take 5 mg by mouth at bedtime. 06/12/19   Laban Emperor, PA-C  clotrimazole-betamethasone (LOTRISONE) cream Apply 1 application topically 2 (two) times daily.    [provider]  donepezil (ARICEPT) 5 MG tablet Take 5 mg by mouth daily. 11/25/19   [provider]  DULoxetine (CYMBALTA) 60 MG capsule  12/24/19   [provider]  DULoxetine HCl 60 MG CSDR Take 60 mg by mouth daily.     [provider]  enoxaparin (LOVENOX) 40 MG/0.4ML injection Inject 0.4 mLs (40 mg total) into the skin daily for 17 days. 12/24/19 01/10/20  Jennye Boroughs, MD  famotidine (PEPCID) 20 MG tablet Take 1 tablet (20 mg total) by mouth 2 (two) times daily. 07/18/21   Carrie Mew, MD  furosemide (LASIX) 20 MG tablet Take 20 mg by mouth every other day.  11/29/16   [provider]  gentian violet 2 % topical solution Apply 0.5 mLs topically in the morning and at bedtime. Apply between the toes twice daily. 10/20/19   [provider]  insulin aspart (NOVOLOG) 100 UNIT/ML injection Inject 0-15 Units into the skin 3 (three) times daily with meals. CBG 70 - 120: 0 units CBG 121 - 150: 2 units CBG 151 - 200: 3 units CBG 201 - 250: 5 units CBG 251 - 300: 8 units CBG 301 - 350: 11 units CBG 351 - 400: 15 units CBG > 400: call MD and obtain STAT lab verification 12/22/19   Mercy Riding, MD  insulin aspart (NOVOLOG) 100 UNIT/ML injection Inject 6 Units into the skin 3 (three) times daily  with meals. 12/22/19   Mercy Riding, MD  insulin glargine (LANTUS) 100 UNIT/ML injection Inject 0.6 mLs (60 Units total) into the skin 2 (two) times daily. 12/22/19   Mercy Riding, MD  ketoconazole (NIZORAL) 2 % cream Apply 1 application topically in the morning and at bedtime. 09/24/19   [provider]  metFORMIN (GLUCOPHAGE) 500 MG tablet  12/24/19   [provider]  metFORMIN (GLUCOPHAGE-XR) 500 MG 24 hr tablet Take 500 mg by mouth daily. With dinner.    [provider]  mirabegron ER (MYRBETRIQ) 25 MG TB24 tablet Take 1 tablet (25 mg total) by mouth daily. 07/01/19   Zara Council A, PA-C  ondansetron (ZOFRAN ODT) 4 MG disintegrating tablet Take 1 tablet (4 mg total) by mouth every 8 (eight) hours as needed for nausea or vomiting. 07/18/21   Carrie Mew, MD  potassium chloride (K-DUR,KLOR-CON) 10 MEQ tablet Take 10 mEq by mouth daily.  [provider]  potassium chloride (KLOR-CON) 10 MEQ tablet  02/15/20   [provider]  QUEtiapine (SEROQUEL) 25 MG tablet Take by mouth. 01/27/20   [provider]  ramipril (ALTACE) 5 MG capsule Take 5 mg by mouth daily.    [provider]  saccharomyces boulardii (FLORASTOR) 250 MG capsule Take 1 capsule (250 mg total) by mouth 2 (two) times daily. 12/22/19   Mercy Riding, MD  SANTYL ointment Apply 1 application topically daily. 11/28/19   [provider]  simvastatin (ZOCOR) 40 MG tablet Take 40 mg by mouth every morning.     [provider]  terconazole (TERAZOL 7) 0.4 % vaginal cream Place 1 applicator vaginally at bedtime. 09/02/18   [provider]  traZODone (DESYREL) 50 MG tablet Take 0.5 tablets (25 mg total) by mouth at bedtime as needed for sleep. 12/22/19   Mercy Riding, MD  triamcinolone cream (KENALOG) 0.1 %  12/24/19   [provider]  triamcinolone lotion (KENALOG) 0.1 % Apply 1 application topically 3 (three) times daily. 08/19/18   [provider]    Allergies Codeine    Social History Social History   Tobacco Use   Smoking status: Never   Smokeless tobacco: Never  Substance Use Topics   Alcohol use: No   Drug use: No    Review of Systems Patient denies headaches, rhinorrhea, blurry vision, numbness, shortness of breath, chest pain, edema, cough, abdominal pain, nausea, vomiting, diarrhea, dysuria, fevers, rashes or hallucinations unless otherwise stated above in HPI. ____________________________________________   PHYSICAL EXAM:  VITAL SIGNS: Vitals:   07/27/21 1515 07/27/21 1520  BP: (!) 121/46 (!) 121/46  Pulse: 76 77  Resp: 16 18  Temp:  (!) 97 F (36.1 C)  SpO2: 100% 100%    Constitutional: Alert, acutely agitated and aggressive towards staff, unable to be redirected Eyes: Conjunctivae are normal.  Head: Atraumatic. Nose: No congestion/rhinnorhea. Mouth/Throat: Mucous membranes are moist.   Neck: No stridor. Painless ROM.  Cardiovascular: Normal rate, regular rhythm. Grossly normal heart sounds.  Good peripheral circulation. Respiratory: Normal respiratory effort.  No retractions. Lungs CTAB. Gastrointestinal: Soft and nontender. No distention. No abdominal bruits. No CVA tenderness. Genitourinary:  Musculoskeletal: No lower extremity tenderness,  mottled discoloration of BLE, cap refill <3sec.  No warmth.  Weakn pt and dp pulses. No joint effusions. Neurologic:  MAE, but unable to stand on her own.  Not following commands Skin:  Skin is warm, dry and intact. No rash noted. Psychiatric: acutely agitated, uncooperative  ____________________________________________   LABS (all labs ordered are listed, but only abnormal results are displayed)  Results for orders placed or performed during the hospital encounter of 07/27/21 (from the past 24 hour(s))  Protime-INR     Status: None   Collection Time: 07/27/21 11:59 AM  Result Value Ref Range   Prothrombin Time 13.6 11.4 - 15.2 seconds    INR 1.0 0.8 - 1.2  APTT     Status: None   Collection Time: 07/27/21 11:59 AM  Result Value Ref Range   aPTT 28 24 - 36 seconds  CBC     Status: Abnormal   Collection Time: 07/27/21 11:59 AM  Result Value Ref Range   WBC 9.7 4.0 - 10.5 K/uL   RBC 5.44 (H) 3.87 - 5.11 MIL/uL   Hemoglobin 16.1 (H) 12.0 - 15.0 g/dL   HCT 49.1 (H) 36.0 - 46.0 %   MCV 90.3 80.0 - 100.0 fL  MCH 29.6 26.0 - 34.0 pg   MCHC 32.8 30.0 - 36.0 g/dL   RDW 13.6 11.5 - 15.5 %   Platelets 298 150 - 400 K/uL   nRBC 0.0 0.0 - 0.2 %  Differential     Status: None   Collection Time: 07/27/21 11:59 AM  Result Value Ref Range   Neutrophils Relative % 58 %   Neutro Abs 5.5 1.7 - 7.7 K/uL   Lymphocytes Relative 31 %   Lymphs Abs 3.0 0.7 - 4.0 K/uL   Monocytes Relative 7 %   Monocytes Absolute 0.7 0.1 - 1.0 K/uL   Eosinophils Relative 4 %   Eosinophils Absolute 0.4 0.0 - 0.5 K/uL   Basophils Relative 0 %   Basophils Absolute 0.0 0.0 - 0.1 K/uL   Immature Granulocytes 0 %   Abs Immature Granulocytes 0.03 0.00 - 0.07 K/uL  Comprehensive metabolic panel     Status: Abnormal   Collection Time: 07/27/21 11:59 AM  Result Value Ref Range   Sodium 135 135 - 145 mmol/L   Potassium 4.6 3.5 - 5.1 mmol/L   Chloride 104 98 - 111 mmol/L   CO2 26 22 - 32 mmol/L   Glucose, Bld 126 (H) 70 - 99 mg/dL   BUN 31 (H) 8 - 23 mg/dL   Creatinine, Ser 1.32 (H) 0.44 - 1.00 mg/dL   Calcium 9.4 8.9 - 10.3 mg/dL   Total Protein 8.0 6.5 - 8.1 g/dL   Albumin 3.4 (L) 3.5 - 5.0 g/dL   AST 42 (H) 15 - 41 U/L   ALT 33 0 - 44 U/L   Alkaline Phosphatase 89 38 - 126 U/L   Total Bilirubin 0.5 0.3 - 1.2 mg/dL   GFR, Estimated 43 (L) >60 mL/min   Anion gap 5 5 - 15  Troponin I (High Sensitivity)     Status: None   Collection Time: 07/27/21 11:59 AM  Result Value Ref Range   Troponin I (High Sensitivity) 14 <18 ng/L  CBG monitoring, ED     Status: Abnormal   Collection Time: 07/27/21 12:03 PM  Result Value Ref Range   Glucose-Capillary  136 (H) 70 - 99 mg/dL   Comment 1 Notify RN    Comment 2 Document in Chart   CBG monitoring, ED     Status: None   Collection Time: 07/27/21  1:29 PM  Result Value Ref Range   Glucose-Capillary 86 70 - 99 mg/dL   ____________________________________________  EKG My review and personal interpretation at Time: 14:16   Indication: ams  Rate: 80  Rhythm: sinus Axis: left Other: normal intervals,  poor  r wave progression ____________________________________________  RADIOLOGY  I personally reviewed all radiographic images ordered to evaluate for the above acute complaints and reviewed radiology reports and findings.  These findings were personally discussed with the patient.  Please see medical record for radiology report.  ____________________________________________   PROCEDURES  Procedure(s) performed:  .Critical Care Performed by: Merlyn Lot, MD Authorized by: Merlyn Lot, MD   Critical care provider statement:    Critical care time (minutes):  35   Critical care was necessary to treat or prevent imminent or life-threatening deterioration of the following conditions:  CNS failure or compromise (acute agitation)   Critical care was time spent personally by me on the following activities:  Ordering and performing treatments and interventions, ordering and review of laboratory studies, ordering and review of radiographic studies, pulse oximetry, re-evaluation of patient's condition, review of old charts, obtaining  history from patient or surrogate, examination of patient, evaluation of patient's response to treatment, discussions with primary provider, discussions with consultants and development of treatment plan with patient or surrogate    Critical Care performed: yes ____________________________________________   INITIAL IMPRESSION / ASSESSMENT AND PLAN / ED COURSE  Pertinent labs & imaging results that were available during my care of the patient were  reviewed by me and considered in my medical decision making (see chart for details).   DDX: Dehydration, sepsis, pna, uti, hypoglycemia, cva, drug effect, withdrawal, encephalitis   Dyneshia L Starkel is a 70 y.o. who presents to the ED with altered mental status weakness over the past 2 weeks.  Reportedly recently treated for UTI she is afebrile initially hemodynamically stable but repeat blood pressure downtrending she is encephalopathic.  She is not meningitic.  She is moving all extremities spontaneously but seems to have some weakness in her legs.  CT head without acute abnormality.  Patient is acutely agitated and aggressive swinging at staff.  Patient given IV fluids.  Will give IM calming medication to be O to further evaluate and assess.  Clinical Course as of 07/27/21 1546  Wed Jul 27, 2021  1419 Patient starting to calm down to be more cooperative.  Her abdominal exam is benign appearing but limited by obesity.  Appears quite encephalopathic given her low blood pressure with altered mental status concern for dissection.  Contacted CT for emergent CTA.  Giving IV fluids given her low blood pressure. [PR]  1510 Repeat blood pressure improving with IV fluid. [PR]  1520 Blood pressure improved after IV fluids.  She is satting well but is requiring 2 L nasal cannula.  Chest x-ray does show cardiomegaly and pulmonary edema.  At this point uncertain as to whether this is cardiac event.  On my review of CT I do not see any sign of dissection.  CVA is on the differential but no lateralizing deficits and seems more metabolic particularly with episode of hypotension. [PR]  1525 IMPRESSION: No evidence of aortic dissection.   Small area of hypoenhancement at the lower pole of the right kidney suspicious for pyelonephritis. Circumferential bladder wall thickening may reflect cystitis or under distension.   Mild aortic atherosclerosis.      [KM]    Clinical Course User Index [KM] Rada Hay,  MD [PR] Merlyn Lot, MD   Patient again becoming agitated will require additional IM calming medication.  She is on telemetry.  Blood pressure improved after IV hydration.  She is not hypoxic on 2 L.  Awaiting urinalysis.  Has already received IV antibiotics for presumed pyelonephritis.  Patient be signed out to oncoming physician pending lab results.  Anticipate admission  The patient was evaluated in Emergency Department today for the symptoms described in the history of present illness. He/she was evaluated in the context of the global COVID-19 pandemic, which necessitated consideration that the patient might be at risk for infection with the SARS-CoV-2 virus that causes COVID-19. Institutional protocols and algorithms that pertain to the evaluation of patients at risk for COVID-19 are in a state of rapid change based on information released by regulatory bodies including the CDC and federal and state organizations. These policies and algorithms were followed during the patient's care in the ED.  As part of my medical decision making, I reviewed the following data within the [facility] notes reviewed and incorporated, Labs reviewed, notes from prior ED visits and [place] Controlled Substance Database  ____________________________________________   FINAL CLINICAL IMPRESSION(S) / ED DIAGNOSES  Final diagnoses:  Acute encephalopathy  Agitation      NEW MEDICATIONS STARTED DURING THIS VISIT:  New Prescriptions   No medications on file     Note:  This document was prepared using Dragon voice recognition software and may include unintentional dictation errors.    Merlyn Lot, MD 07/27/21 [phone]

## 2021-07-27 NOTE — ED Provider Notes (Signed)
Patient signed out to me at 53 PM.  70 year old female with peripheral vascular disease who presents with significant altered mental status and functional decline.  She is brought in by her caregiver.  She is hypotensive initially but responded to IV fluids.  Labs are overall reassuring but we are pending a troponin UA and lactate.  CT head is negative and chest x-ray does not show any obvious infiltrate.  A CT angio of the chest abdomen pelvis was obtained by the previous provider which is read as may be a right-sided pyelo and cystitis.  She was covered with cefepime.  Required Haldol and Versed for significant agitation.  We will follow-up the urine and her troponin.  Patient will need admission.  Patient's urine is positive.  Her lactate is 3.4.  She got 500 cc of saline initially and her blood pressure improved.  We will give her another 500 cc.  She has a history of CHF so do not want to give a whole 30 cc/kg especially now that her blood pressure is improved.  Patient required additional sedation while in the ED including IM Geodon and IV Versed for ongoing agitation.   Rada Hay, MD 07/27/21 [phone]

## 2021-07-27 NOTE — ED Notes (Signed)
Pt placed in a recliner and placed in SW with her daughter present

## 2021-07-27 NOTE — Consult Note (Signed)
PHARMACY -  BRIEF ANTIBIOTIC NOTE   Pharmacy has received consult(s) for cefepime from an ED provider.  The patient's profile has been reviewed for ht/wt/allergies/indication/available labs.    One time order(s) placed for cefepime 2 g  Further antibiotics/pharmacy consults should be ordered by admitting physician if indicated.                       Thank you, Darnelle Bos, PharmD 07/27/2021  2:58 PM

## 2021-07-27 NOTE — Progress Notes (Signed)
Pharmacy Antibiotic Note  Judith Shaw is a 70 y.o. female w/ PMH of PVDadmitted on 07/27/2021 with sepsis.  Pharmacy has been consulted for vancomycin dosing.  Plan: start vancomycin 2000 mg IV x 1 then 1000 mg IV every 24 hours Goal AUC 400-550 Expected AUC: 522.2 SCr used: 1.32 mg/dL T1/2 20.6 h, Ke 0.0034 h-1 Daily renal function assessment while on IV vancomycin   Height: 5\' 5"  (165.1 cm) Weight: 114 kg (251 lb 5.2 oz) IBW/kg (Calculated) : 57  Temp (24hrs), Avg:97.3 F (36.3 C), Min:97 F (36.1 C), Max:97.5 F (36.4 C)  Recent Labs  Lab 07/27/21 1159 07/27/21 1620  WBC 9.7  --   CREATININE 1.32*  --   LATICACIDVEN  --  3.4*    Estimated Creatinine Clearance: 49.2 mL/min (A) (by C-G formula based on SCr of 1.32 mg/dL (H)).    Allergies  Allergen Reactions   Codeine     Antimicrobials this admission: 11/30 vancomycin >>  11/30 ceftriaxone >>   Microbiology results: 11/30 BCx: pending 11/30 UCx: pending 11/30 SARS CoV-2: negative 11/30 influenza A/B: negative   Thank you for allowing pharmacy to be a part of this patient's care.  Dallie Piles 07/27/2021 7:06 PM

## 2021-07-27 NOTE — ED Notes (Signed)
Resting finally with eyes closed. Caregiver in room with  patient.

## 2021-07-27 NOTE — ED Triage Notes (Signed)
Pt caregiver reports the last 2 weeks pt is declining. Unable to walk now, AMS, decreased appetite, disoriented. Pt not answering all orientation questions appropriately. Pt has had multiple falls, unsure of hitting head.

## 2021-07-27 NOTE — ED Notes (Signed)
Haldol 5mg  given IM and 1mg  ov versed given IVP for increase agitation.

## 2021-07-27 NOTE — ED Triage Notes (Signed)
First nurse note: Per MD from [employer], pt being sent from [facility] for decreased eating, increased generalized weakness, fall over the past few days. Appeared pale and clammy in office visit.  Type 2 diabetic.

## 2021-07-27 NOTE — ED Notes (Signed)
Patient resting in bed with eyes closed. Resp even, unlabored on 3L per [place]. Patient responds to painful stimulation: opened eyes and stated, "Ahh! What are you doing? You are hurting me!" Then immediately closed eyes and is sleeping. Vancomycin infusing with no S/S of infiltration. Purewick external catheter in place.

## 2021-07-27 NOTE — H&P (Signed)
History and Physical   Judith Shaw DHW:[id number] DOB: 1950/12/16 DOA: 07/27/2021  PCP: Pcp, No  Outpatient Specialists: Dr. Jacklynn Ganong [facility] endocrinology Patient coming from: Home via EMS  I have personally briefly reviewed patient's old medical records in [place].  Chief Concern: Altered mental status  HPI: Judith Shaw is a 70 y.o. female with medical history significant for obstructive sleep apnea, obesity, anxiety, hypertension, history of fatty liver, hyperlipidemia, history of uncontrolled insulin-dependent diabetes mellitus type 2, who presents emergency department for chief concerns of altered mental status.  At bedside patient was sleeping and snoring deeply and did not appear to be in acute distress.  She was difficult to arouse.  When she did awake she did tell me her name was Judith Shaw.  She was able to tell me her age of 70 years old.  And then she probably fell back asleep.  Social history: Unknown  Vaccination history: Unknown  ROS: Constitutional: no weight change, no fever ENT/Mouth: no sore throat, no rhinorrhea Eyes: no eye pain, no vision changes Cardiovascular: no chest pain, no dyspnea,  no edema, no palpitations Respiratory: no cough, no sputum, no wheezing Gastrointestinal: no nausea, no vomiting, no diarrhea, no constipation Genitourinary: no urinary incontinence, no dysuria, no hematuria Musculoskeletal: no arthralgias, no myalgias Skin: no skin lesions, no pruritus, Neuro: + weakness, no loss of consciousness, no syncope Psych: no anxiety, no depression, + decrease appetite Heme/Lymph: no bruising, no bleeding  ED Course: Discussed with ED provider, patient requiring hospitalization for chief concerns of altered mental status presumed secondary to sepsis.  Vitals in the emergency department was remarkable for temperature of 97, respiration rate of 20, heart rate of 73, blood pressure 95/65, and improved to 98/57, SPO2 of 96% on 3 L nasal  cannula.  Labs in the emergency department was remarkable for serum sodium 135, potassium 4.6, chloride 104, bicarb 26, BUN of 31, serum creatinine of 1.32, nonfasting blood glucose 126, WBC 9.7, hemoglobin 16.1, platelets 298.  Lactic acid was elevated at 3.4.  Troponin high-sensitivity was 13.  UA was positive for small leukocytes and positive for nitrates.  Blood cultures, urine culture are pending.  Assessment/Plan  Principal Problem:   Severe sepsis with acute organ dysfunction (HCC) Active Problems:   Sepsis ([place])   UTI (urinary tract infection)   Sleep apnea   Type 2 diabetes mellitus with diabetic nephropathy, with long-term current use of insulin ([place])   # Altered mental status-presumed metabolic encephalopathy secondary to sepsis # Patient initially met sepsis criteria with mild hypotension, increased respiration rate, elevated lactic acid, source of urine - Patient initially required 2 doses of haloperidol 5 mg each, Versed 1 mg and Versed 2 mg IV, Geodon 20 mg IV - Admit to telemetry medicine floor - Status post cefepime per EDP - Continue with ceftriaxone 2 g IV - Vancomycin per pharmacy for sepsis  # Bilateral inguinal intertrigo - nystatin powder bid to inguinal groin ordered   # Hyperlipidemia-simvastatin 40 mg daily resumed  # OSA-CPAP nightly ordered  # Morbid obesity  # A.m. team to complete med reconciliation  Chart reviewed.   DVT prophylaxis: Heparin 5000 units subcutaneous every 8 hours Code Status: Full code Diet: Full code Family Communication: No Disposition Plan: Pending clinical course Consults called: None at this time Admission status: Telemetry medical  Past Medical History:  Diagnosis Date   Anxiety    Cancer ([place])    pt states hx of uterine cancer and had a  complete hyst   CHF (congestive heart failure) ([place])    Depression    Diabetes mellitus without complication ([place])    Hyperlipidemia    Hypertension    Past Surgical History:   Procedure Laterality Date   ABDOMINAL HYSTERECTOMY     AMPUTATION Left 12/19/2019   Procedure: AMPUTATION RAY Left 5th;  Surgeon: Caroline More, DPM;  Location: [facility] ORS;  Service: Podiatry;  Laterality: Left;   CHOLECYSTECTOMY     LOWER EXTREMITY ANGIOGRAPHY Left 12/17/2019   Procedure: Lower Extremity Angiography;  Surgeon: Algernon Huxley, MD;  Location: [facility];  Service: Cardiovascular;  Laterality: Left;   Social History:  reports that she has never smoked. She has never used smokeless tobacco. She reports that she does not drink alcohol and does not use drugs.  Allergies  Allergen Reactions   Codeine    Family History  Problem Relation Age of Onset   Hypertension Mother    Heart disease Father    Prostate cancer Brother    Diabetes Maternal Aunt    Kidney cancer Neg Hx    Bladder Cancer Neg Hx    Family history: Family history reviewed and not pertinent  Prior to Admission medications   Medication Sig Start Date End Date Taking? Authorizing Provider  acetaminophen (TYLENOL) 325 MG tablet Take 2 tablets (650 mg total) by mouth every 6 (six) hours as needed. 07/18/21   Carrie Mew, MD  aluminum-magnesium hydroxide-simethicone (MAALOX) 200-200-20 MG/5ML SUSP Take 30 mLs by mouth 4 (four) times daily -  before meals and at bedtime. 07/18/21   Carrie Mew, MD  aspirin EC 81 MG tablet Take 81 mg by mouth daily.    [provider]  Baclofen 5 MG TABS Take 5 mg by mouth at bedtime. 06/12/19   Laban Emperor, PA-C  clotrimazole-betamethasone (LOTRISONE) cream Apply 1 application topically 2 (two) times daily.    [provider]  donepezil (ARICEPT) 5 MG tablet Take 5 mg by mouth daily. 11/25/19   [provider]  DULoxetine (CYMBALTA) 60 MG capsule  12/24/19   [provider]  DULoxetine HCl 60 MG CSDR Take 60 mg by mouth daily.     [provider]  famotidine (PEPCID) 20 MG tablet Take 1 tablet (20 mg total) by mouth 2  (two) times daily. 07/18/21   Carrie Mew, MD  furosemide (LASIX) 20 MG tablet Take 20 mg by mouth every other day.  11/29/16   [provider]  gentian violet 2 % topical solution Apply 0.5 mLs topically in the morning and at bedtime. Apply between the toes twice daily. 10/20/19   [provider]  insulin aspart (NOVOLOG) 100 UNIT/ML injection Inject 0-15 Units into the skin 3 (three) times daily with meals. CBG 70 - 120: 0 units CBG 121 - 150: 2 units CBG 151 - 200: 3 units CBG 201 - 250: 5 units CBG 251 - 300: 8 units CBG 301 - 350: 11 units CBG 351 - 400: 15 units CBG > 400: call MD and obtain STAT lab verification 12/22/19   Mercy Riding, MD  insulin aspart (NOVOLOG) 100 UNIT/ML injection Inject 6 Units into the skin 3 (three) times daily with meals. 12/22/19   Mercy Riding, MD  insulin glargine (LANTUS) 100 UNIT/ML injection Inject 0.6 mLs (60 Units total) into the skin 2 (two) times daily. 12/22/19   Mercy Riding, MD  ketoconazole (NIZORAL) 2 % cream Apply 1 application topically in the morning and  at bedtime. 09/24/19   [provider]  metFORMIN (GLUCOPHAGE) 500 MG tablet  12/24/19   [provider]  metFORMIN (GLUCOPHAGE-XR) 500 MG 24 hr tablet Take 500 mg by mouth daily. With dinner.    [provider]  mirabegron ER (MYRBETRIQ) 25 MG TB24 tablet Take 1 tablet (25 mg total) by mouth daily. 07/01/19   Zara Council A, PA-C  ondansetron (ZOFRAN ODT) 4 MG disintegrating tablet Take 1 tablet (4 mg total) by mouth every 8 (eight) hours as needed for nausea or vomiting. 07/18/21   Carrie Mew, MD  potassium chloride (K-DUR,KLOR-CON) 10 MEQ tablet Take 10 mEq by mouth daily.    [provider]  potassium chloride (KLOR-CON) 10 MEQ tablet  02/15/20   [provider]  QUEtiapine (SEROQUEL) 25 MG tablet Take by mouth. 01/27/20   [provider]  ramipril (ALTACE) 5 MG capsule Take 5 mg by mouth daily.    [provider]  saccharomyces boulardii (FLORASTOR) 250 MG capsule Take 1 capsule (250 mg total) by mouth 2 (two) times daily. 12/22/19   Mercy Riding, MD  SANTYL ointment Apply 1 application topically daily. 11/28/19   [provider]  simvastatin (ZOCOR) 40 MG tablet Take 40 mg by mouth every morning.     [provider]  terconazole (TERAZOL 7) 0.4 % vaginal cream Place 1 applicator vaginally at bedtime. 09/02/18   [provider]  traZODone (DESYREL) 50 MG tablet Take 0.5 tablets (25 mg total) by mouth at bedtime as needed for sleep. 12/22/19   Mercy Riding, MD  triamcinolone cream (KENALOG) 0.1 %  12/24/19   [provider]  triamcinolone lotion (KENALOG) 0.1 % Apply 1 application topically 3 (three) times daily. 08/19/18   [provider]   Physical Exam: Vitals:   07/27/21 1520 07/27/21 1530 07/27/21 1800 07/27/21 1838  BP: (!) 121/46 106/86 136/63 (!) 98/57  Pulse: 77 83 100 84  Resp: 18 17 (!) 21 16  Temp: (!) 97 F (36.1 C)     TempSrc: Oral     SpO2: 100% 97% 100% 100%  Weight:      Height:       Constitutional: appears older than chronological age, NAD, hirsutism on her chin Eyes: PERRL, lids and conjunctivae normal ENMT: Mucous membranes are moist. Posterior pharynx clear of any exudate or lesions. Age-appropriate dentition. Hearing appropriate Neck: normal, supple, no masses, no thyromegaly Respiratory: clear to auscultation bilaterally, no wheezing, no crackles. Normal respiratory effort. No accessory muscle use.  Cardiovascular: Regular rate and rhythm, no murmurs / rubs / gallops. No extremity edema. 2+ pedal pulses. No carotid bruits.  Abdomen: morbidly obese, no tenderness, no masses palpated, no hepatosplenomegaly. Bowel sounds positive.  Musculoskeletal: no clubbing / cyanosis. No joint deformity upper and lower extremities. Good ROM, no contractures, no atrophy. Normal muscle tone.  Skin: bilateral inguinal groin  rashes. Neurologic: Sensation intact. Strength 5/5 in all 4.  Psychiatric: Normal judgment and insight. Alert and oriented x 3. Normal mood.   EKG: independently reviewed, showing sinus rhythm with rate of 80, QTc 449  Chest x-ray on Admission: I personally reviewed and I agree with radiologist reading as below.  CT HEAD WO CONTRAST  Result Date: 07/27/2021 CLINICAL DATA:  Neurological deficit EXAM: CT HEAD WITHOUT CONTRAST TECHNIQUE: Contiguous axial images were obtained from the base of the skull through the vertex without intravenous contrast. COMPARISON:  CT head dated April 30, 2021 FINDINGS: Brain: Chronic white matter  ischemic change. No evidence of acute infarction, hemorrhage, hydrocephalus, extra-axial collection or mass lesion/mass effect. Vascular: No hyperdense vessel or unexpected calcification. Skull: Normal. Negative for fracture or focal lesion. Sinuses/Orbits: No acute finding. Other: None. IMPRESSION: No acute intracranial abnormality. Electronically Signed   By: Yetta Glassman M.D.   On: 07/27/2021 13:04   DG Chest Portable 1 View  Result Date: 07/27/2021 CLINICAL DATA:  Altered mental status EXAM: PORTABLE CHEST 1 VIEW COMPARISON:  Chest x-ray dated April 30, 2021 FINDINGS: Unchanged enlarged cardiac and mediastinal contours. Mild bilateral heterogeneous opacities. No large pleural effusion or pneumothorax. IMPRESSION: Mild bilateral heterogeneous opacities, possibly due to pulmonary edema. SP Electronically Signed   By: Yetta Glassman M.D.   On: 07/27/2021 14:36   CT Angio Chest/Abd/Pel for Dissection W and/or Wo Contrast  Result Date: 07/27/2021 CLINICAL DATA:  Abdominal pain, aortic dissection suspected EXAM: CT ANGIOGRAPHY CHEST, ABDOMEN AND PELVIS TECHNIQUE: Non-contrast CT of the chest was initially obtained. Multidetector CT imaging through the chest, abdomen and pelvis was performed using the standard protocol during bolus administration of intravenous  contrast. Multiplanar reconstructed images and MIPs were obtained and reviewed to evaluate the vascular anatomy. CONTRAST:  49m OMNIPAQUE IOHEXOL 350 MG/ML SOLN COMPARISON:  CT abdomen 07/18/2021 FINDINGS: CTA CHEST FINDINGS Cardiovascular: Thoracic aorta is normal in caliber without evidence of intramural hematoma or dissection. Normal heart size. No pericardial effusion. Mediastinum/Nodes: No enlarged nodes. Imaged thyroid is unremarkable. Lungs/Pleura: Imaged in expiration with patchy atelectasis bilaterally. No pleural effusion or pneumothorax. Musculoskeletal: Degenerative changes of the included spine. Review of the MIP images confirms the above findings. CTA ABDOMEN AND PELVIS FINDINGS VASCULAR Aorta: Normal caliber aorta without aneurysm, dissection, vasculitis or significant stenosis. Mild atherosclerotic plaque. Celiac: Patent.  No significant origin stenosis. SMA: Patent.  No significant origin stenosis. Renals: Patent.  No significant origin stenosis. IMA: Patent.  No significant origin stenosis. Inflow: Patent.  Normal in caliber. Veins: Not well evaluated. Review of the MIP images confirms the above findings. NON-VASCULAR Hepatobiliary: No focal liver abnormality is seen. Status post cholecystectomy. No biliary dilatation. Pancreas: Unremarkable. Spleen: Unremarkable. Adrenals/Urinary Tract: Adrenals are unremarkable. There is a new small area of hypoenhancement at the lower pole of the right kidney. Unchanged left renal scarring with atrophy. Similar circumferential bladder wall thickening. Stomach/Bowel: Stomach is within normal limits. Bowel is normal in caliber. Normal appendix. Lymphatic: No enlarged nodes. Reproductive: Status post hysterectomy. No adnexal masses. Other: No free fluid. No acute abnormality of the abdominal wall. Left lateral abdominal wall is partially excluded. Musculoskeletal: Degenerative changes of the included spine. Degenerative changes of the hips. Review of the MIP  images confirms the above findings. IMPRESSION: No evidence of aortic dissection. Small area of hypoenhancement at the lower pole of the right kidney suspicious for pyelonephritis. Circumferential bladder wall thickening may reflect cystitis or under distension. Mild aortic atherosclerosis. Electronically Signed   By: PMacy MisM.D.   On: 07/27/2021 15:21    Labs on Admission: I have personally reviewed following labs  CBC: Recent Labs  Lab 07/27/21 1159  WBC 9.7  NEUTROABS 5.5  HGB 16.1*  HCT 49.1*  MCV 90.3  PLT 2185  Basic Metabolic Panel: Recent Labs  Lab 07/27/21 1159  NA 135  K 4.6  CL 104  CO2 26  GLUCOSE 126*  BUN 31*  CREATININE 1.32*  CALCIUM 9.4   GFR: Estimated Creatinine Clearance: 49.2 mL/min (A) (by C-G formula based on SCr of 1.32 mg/dL (H)).  Liver Function  Tests: Recent Labs  Lab 07/27/21 1159  AST 42*  ALT 33  ALKPHOS 89  BILITOT 0.5  PROT 8.0  ALBUMIN 3.4*   Coagulation Profile: Recent Labs  Lab 07/27/21 1159  INR 1.0   CBG: Recent Labs  Lab 07/27/21 1203 07/27/21 1329  GLUCAP 136* 86   Thyroid Function Tests: Recent Labs    07/27/21 1159 07/27/21 1620  TSH 6.743*  --   FREET4  --  0.90   Anemia Panel: No results for input(s): VITAMINB12, FOLATE, FERRITIN, TIBC, IRON, RETICCTPCT in the last 72 hours.  Urine analysis:    Component Value Date/Time   COLORURINE YELLOW 07/27/2021 1713   APPEARANCEUR CLEAR 07/27/2021 1713   APPEARANCEUR Cloudy 05/20/2012 0116   LABSPEC 1.015 07/27/2021 1713   LABSPEC 1.025 05/20/2012 0116   PHURINE 5.5 07/27/2021 1713   GLUCOSEU NEGATIVE 07/27/2021 1713   GLUCOSEU >=500 05/20/2012 0116   HGBUR TRACE (A) 07/27/2021 1713   BILIRUBINUR NEGATIVE 07/27/2021 1713   BILIRUBINUR Negative 05/20/2012 0116   KETONESUR NEGATIVE 07/27/2021 1713   PROTEINUR 100 (A) 07/27/2021 1713   NITRITE POSITIVE (A) 07/27/2021 1713   LEUKOCYTESUR SMALL (A) 07/27/2021 1713   LEUKOCYTESUR 2+ 05/20/2012 0116    Dr. Tobie Poet [facility]  If 7PM-7AM, please contact overnight-coverage provider If 7AM-7PM, please contact day coverage provider [email or web address]  07/27/2021, 7:00 PM

## 2021-07-27 NOTE — ED Notes (Signed)
Patient confused making up words that are not noticeable. Artist. Yelling and cursing. IV insert LAC. Rhythm Sinus on Cardiac monitor. Caregiver present. Linde Gillis is the she is her guardian [phone].

## 2021-07-27 NOTE — ED Notes (Signed)
Respiratory therapy called requesting CPAP to be placed on patient.

## 2021-07-28 ENCOUNTER — Encounter: Payer: Self-pay | Admitting: Internal Medicine

## 2021-07-28 DIAGNOSIS — F0390 Unspecified dementia without behavioral disturbance: Secondary | ICD-10-CM | POA: Diagnosis present

## 2021-07-28 DIAGNOSIS — G9341 Metabolic encephalopathy: Secondary | ICD-10-CM | POA: Diagnosis present

## 2021-07-28 DIAGNOSIS — Z7982 Long term (current) use of aspirin: Secondary | ICD-10-CM | POA: Diagnosis not present

## 2021-07-28 DIAGNOSIS — R6520 Severe sepsis without septic shock: Secondary | ICD-10-CM | POA: Diagnosis present

## 2021-07-28 DIAGNOSIS — I110 Hypertensive heart disease with heart failure: Secondary | ICD-10-CM | POA: Diagnosis present

## 2021-07-28 DIAGNOSIS — N10 Acute tubulo-interstitial nephritis: Secondary | ICD-10-CM | POA: Diagnosis present

## 2021-07-28 DIAGNOSIS — E875 Hyperkalemia: Secondary | ICD-10-CM | POA: Diagnosis present

## 2021-07-28 DIAGNOSIS — E1165 Type 2 diabetes mellitus with hyperglycemia: Secondary | ICD-10-CM | POA: Diagnosis present

## 2021-07-28 DIAGNOSIS — I5032 Chronic diastolic (congestive) heart failure: Secondary | ICD-10-CM | POA: Diagnosis present

## 2021-07-28 DIAGNOSIS — N179 Acute kidney failure, unspecified: Secondary | ICD-10-CM | POA: Diagnosis present

## 2021-07-28 DIAGNOSIS — G4733 Obstructive sleep apnea (adult) (pediatric): Secondary | ICD-10-CM | POA: Diagnosis present

## 2021-07-28 DIAGNOSIS — L304 Erythema intertrigo: Secondary | ICD-10-CM | POA: Diagnosis present

## 2021-07-28 DIAGNOSIS — E785 Hyperlipidemia, unspecified: Secondary | ICD-10-CM | POA: Diagnosis present

## 2021-07-28 DIAGNOSIS — E1121 Type 2 diabetes mellitus with diabetic nephropathy: Secondary | ICD-10-CM | POA: Diagnosis present

## 2021-07-28 DIAGNOSIS — F32A Depression, unspecified: Secondary | ICD-10-CM | POA: Diagnosis present

## 2021-07-28 DIAGNOSIS — E872 Acidosis, unspecified: Secondary | ICD-10-CM | POA: Diagnosis present

## 2021-07-28 DIAGNOSIS — A419 Sepsis, unspecified organism: Secondary | ICD-10-CM | POA: Diagnosis not present

## 2021-07-28 DIAGNOSIS — Z6841 Body Mass Index (BMI) 40.0 and over, adult: Secondary | ICD-10-CM | POA: Diagnosis not present

## 2021-07-28 DIAGNOSIS — R451 Restlessness and agitation: Secondary | ICD-10-CM | POA: Diagnosis present

## 2021-07-28 DIAGNOSIS — A4152 Sepsis due to Pseudomonas: Secondary | ICD-10-CM | POA: Diagnosis present

## 2021-07-28 DIAGNOSIS — Z8042 Family history of malignant neoplasm of prostate: Secondary | ICD-10-CM | POA: Diagnosis not present

## 2021-07-28 DIAGNOSIS — Z20822 Contact with and (suspected) exposure to covid-19: Secondary | ICD-10-CM | POA: Diagnosis present

## 2021-07-28 DIAGNOSIS — Z794 Long term (current) use of insulin: Secondary | ICD-10-CM | POA: Diagnosis not present

## 2021-07-28 DIAGNOSIS — Z79899 Other long term (current) drug therapy: Secondary | ICD-10-CM | POA: Diagnosis not present

## 2021-07-28 DIAGNOSIS — Z8249 Family history of ischemic heart disease and other diseases of the circulatory system: Secondary | ICD-10-CM | POA: Diagnosis not present

## 2021-07-28 LAB — CORTISOL-AM, BLOOD: Cortisol - AM: 9.1 ug/dL (ref 6.7–22.6)

## 2021-07-28 LAB — CBC WITH DIFFERENTIAL/PLATELET
Abs Immature Granulocytes: 0.04 10*3/uL (ref 0.00–0.07)
Basophils Absolute: 0 10*3/uL (ref 0.0–0.1)
Basophils Relative: 0 %
Eosinophils Absolute: 0.4 10*3/uL (ref 0.0–0.5)
Eosinophils Relative: 4 %
HCT: 45.6 % (ref 36.0–46.0)
Hemoglobin: 15 g/dL (ref 12.0–15.0)
Immature Granulocytes: 1 %
Lymphocytes Relative: 21 %
Lymphs Abs: 1.9 10*3/uL (ref 0.7–4.0)
MCH: 30.2 pg (ref 26.0–34.0)
MCHC: 32.9 g/dL (ref 30.0–36.0)
MCV: 91.8 fL (ref 80.0–100.0)
Monocytes Absolute: 0.7 10*3/uL (ref 0.1–1.0)
Monocytes Relative: 8 %
Neutro Abs: 5.8 10*3/uL (ref 1.7–7.7)
Neutrophils Relative %: 66 %
Platelets: 252 10*3/uL (ref 150–400)
RBC: 4.97 MIL/uL (ref 3.87–5.11)
RDW: 13.7 % (ref 11.5–15.5)
WBC: 8.8 10*3/uL (ref 4.0–10.5)
nRBC: 0 % (ref 0.0–0.2)

## 2021-07-28 LAB — PHOSPHORUS: Phosphorus: 4.3 mg/dL (ref 2.5–4.6)

## 2021-07-28 LAB — GLUCOSE, CAPILLARY: Glucose-Capillary: 163 mg/dL — ABNORMAL HIGH (ref 70–99)

## 2021-07-28 LAB — BASIC METABOLIC PANEL
Anion gap: 6 (ref 5–15)
BUN: 35 mg/dL — ABNORMAL HIGH (ref 8–23)
CO2: 21 mmol/L — ABNORMAL LOW (ref 22–32)
Calcium: 8.7 mg/dL — ABNORMAL LOW (ref 8.9–10.3)
Chloride: 109 mmol/L (ref 98–111)
Creatinine, Ser: 1.19 mg/dL — ABNORMAL HIGH (ref 0.44–1.00)
GFR, Estimated: 49 mL/min — ABNORMAL LOW (ref >60)
Glucose, Bld: 168 mg/dL — ABNORMAL HIGH (ref 70–99)
Potassium: 5.2 mmol/L — ABNORMAL HIGH (ref 3.5–5.1)
Sodium: 136 mmol/L (ref 135–145)

## 2021-07-28 LAB — MAGNESIUM: Magnesium: 1.9 mg/dL (ref 1.7–2.4)

## 2021-07-28 LAB — PROTIME-INR
INR: 1.1 (ref 0.8–1.2)
Prothrombin Time: 13.7 seconds (ref 11.4–15.2)

## 2021-07-28 LAB — PROCALCITONIN: Procalcitonin: <0.1 ng/mL

## 2021-07-28 LAB — CBG MONITORING, ED: Glucose-Capillary: 134 mg/dL — ABNORMAL HIGH (ref 70–99)

## 2021-07-28 LAB — LACTIC ACID, PLASMA: Lactic Acid, Venous: 1.4 mmol/L (ref 0.5–1.9)

## 2021-07-28 MED ORDER — INSULIN GLARGINE-YFGN 100 UNIT/ML ~~LOC~~ SOLN: 60.0000 [IU] | Freq: Every day | SUBCUTANEOUS | Status: DC

## 2021-07-28 MED ORDER — HALOPERIDOL LACTATE 5 MG/ML IJ SOLN
2.0000 mg | Freq: Four times a day (QID) | INTRAMUSCULAR | Status: DC | PRN
  Administered 2021-07-28 – 2021-07-29 (×3): 2 mg via INTRAMUSCULAR
  Filled 2021-07-28 (×3): qty 1

## 2021-07-28 MED ORDER — MIDAZOLAM HCL 2 MG/2ML IJ SOLN
2.0000 mg | Freq: Once | INTRAMUSCULAR | Status: AC
  Administered 2021-07-28: 2 mg via INTRAVENOUS
  Filled 2021-07-28: qty 2

## 2021-07-28 MED ORDER — DONEPEZIL HCL 5 MG PO TABS
10.0000 mg | ORAL_TABLET | Freq: Every day | ORAL | Status: DC
  Administered 2021-07-28 – 2021-08-01 (×5): 10 mg via ORAL
  Filled 2021-07-28 (×5): qty 2

## 2021-07-28 MED ORDER — DULOXETINE HCL 60 MG PO CPEP
60.0000 mg | ORAL_CAPSULE | Freq: Every day | ORAL | Status: DC
  Administered 2021-07-28 – 2021-08-02 (×6): 60 mg via ORAL
  Filled 2021-07-28 (×6): qty 1

## 2021-07-28 MED ORDER — SODIUM POLYSTYRENE SULFONATE 15 GM/60ML PO SUSP
15.0000 g | Freq: Once | ORAL | Status: AC
  Administered 2021-07-28: 15 g via ORAL
  Filled 2021-07-28 (×2): qty 60

## 2021-07-28 MED ORDER — ENOXAPARIN SODIUM 60 MG/0.6ML IJ SOSY
0.5000 mg/kg | PREFILLED_SYRINGE | INTRAMUSCULAR | Status: DC
  Administered 2021-07-28 – 2021-08-01 (×5): 57.5 mg via SUBCUTANEOUS
  Filled 2021-07-28 (×6): qty 0.6

## 2021-07-28 MED ORDER — INSULIN GLARGINE-YFGN 100 UNIT/ML ~~LOC~~ SOLN
12.0000 [IU] | Freq: Once | SUBCUTANEOUS | Status: AC
  Administered 2021-07-28: 12 [IU] via SUBCUTANEOUS
  Filled 2021-07-28: qty 0.12

## 2021-07-28 MED ORDER — LACTATED RINGERS IV SOLN
INTRAVENOUS | Status: DC
Start: 2021-07-28 — End: 2021-07-30

## 2021-07-28 MED ORDER — ZIPRASIDONE MESYLATE 20 MG IM SOLR: 20.0000 mg | Freq: Once | INTRAMUSCULAR | Status: DC

## 2021-07-28 MED ORDER — QUETIAPINE FUMARATE 25 MG PO TABS
25.0000 mg | ORAL_TABLET | Freq: Every day | ORAL | Status: DC
  Administered 2021-07-28 – 2021-08-01 (×5): 25 mg via ORAL
  Filled 2021-07-28 (×5): qty 1

## 2021-07-28 MED ORDER — INSULIN GLARGINE-YFGN 100 UNIT/ML ~~LOC~~ SOLN
22.0000 [IU] | Freq: Every day | SUBCUTANEOUS | Status: DC
  Administered 2021-07-29 – 2021-07-30 (×2): 22 [IU] via SUBCUTANEOUS
  Filled 2021-07-28 (×2): qty 0.22

## 2021-07-28 NOTE — Progress Notes (Signed)
PROGRESS NOTE    Judith Shaw  JGO:[id number] DOB: 05/10/1951 DOA: 07/27/2021 PCP: Pcp, No    Brief Narrative:  Judith Shaw a 70 y.o. female with medical history significant for obstructive sleep apnea, obesity, anxiety, hypertension, history of fatty liver, hyperlipidemia, history of uncontrolled insulin-dependent diabetes mellitus type 2, who presents emergency department for chief concerns of altered mental status. Shaw diagnosed with UTI and severe sepsis.  Given fluids and antibiotics.   Assessment & Plan:   Principal Problem:   Severe sepsis with acute organ dysfunction (HCC) Active Problems:   Sepsis ([place])   UTI (urinary tract infection)   Sleep apnea   Type 2 diabetes mellitus with diabetic nephropathy, with long-term current use of insulin (HCC)  Severe sepsis secondary to pyelonephritis. Acute pyelonephritis. Acute metabolic encephalopathy secondary to pyelonephritis. Acute kidney injury secondary to severe sepsis Hyperkalemia. Shaw met severe sepsis criteria at time admission, Judith Shaw had a tachypnea and tachycardia, also had a severe hypotension with blood pressure at 53/43.  Severe elevated lactic acid level and acute kidney injury and altered mental status. Judith Shaw had abnormal urine, CT scan also showed evidence of pyelonephritis. Cultures have been sent out.  Shaw received cefepime in Judith emergency room, continued on Rocephin. Shaw has been very agitated, Judith Shaw pulled all her IV out.  Judith Shaw Shaw given Haldol as needed.  I also started Seroquel every evening. Nurse Shaw instructed to place another IV in for antibiotics. Shaw potassium 5.2 injunction with acute kidney injury.  We will give a dose of Kayexalate.  Check level tomorrow.  Morbid obesity. Obstructive sleep apnea. CPAP while asleep.  Dementia. Discussed with Shaw sister, Shaw has baseline dementia.  Type 2 diabetes. Home dose of Lantus dose was decreased due to poor p.o. intake.  DVT  prophylaxis: Lovenox Code Status: full Family Communication: sister, Abe People, Legal guardian updated Disposition Plan:    Status Shaw: Observation  Judith Shaw will require care spanning > 2 midnights and should be moved to inpatient because: Severity of disease, altered mental status.  Required IV antibiotics.       I/O last 3 completed shifts: In: 2500 [IV Piggyback:2500] Out: 400 [Urine:400] Total I/O In: 5597 [I.V.:1191; IV Piggyback:100] Out: 300 [Urine:300]     Consultants:  none  Procedures: none  Antimicrobials: rocephin  Subjective: Shaw Shaw very agitated today, Judith Shaw pulled out her IV.  Judith Shaw received Haldol as needed. Judith Shaw Shaw still confused, but Judith Shaw Shaw more calm now. No short of breath or cough. Abdominal pain or nausea vomiting. No fever or chills.  Objective: Vitals:   07/28/21 0530 07/28/21 0600 07/28/21 0935 07/28/21 1200  BP: (!) 154/88 (!) 172/81 (!) 124/59 102/71  Pulse: 83 77 82 86  Resp: 17 12 20  (!) 22  Temp:      TempSrc:      SpO2: 95% 100% 99% 97%  Weight:      Height:        Intake/Output Summary (Last 24 hours) at 07/28/2021 1323 Last data filed at 07/28/2021 1139 Gross per 24 hour  Intake 3791 ml  Output 700 ml  Net 3091 ml   Filed Weights   07/27/21 1158  Weight: 114 kg    Examination:  General exam: Morbid obese Respiratory system: Clear to auscultation. Respiratory effort normal. Cardiovascular system: S1 & S2 heard, RRR. No JVD, murmurs, rubs, gallops or clicks. No pedal edema. Gastrointestinal system: Abdomen Shaw nondistended, soft and nontender. No organomegaly or masses felt. Normal bowel sounds heard.  Central nervous system: Alert and oriented x1. No focal neurological deficits. Extremities: Symmetric 5 x 5 power. Skin: No rashes, lesions or ulcers Psychiatry: Mood & affect appropriate.     Data Reviewed: I have personally reviewed following labs and imaging studies  CBC: Recent Labs  Lab 07/27/21 1159  07/28/21 0644  WBC 9.7 8.8  NEUTROABS 5.5 5.8  HGB 16.1* 15.0  HCT 49.1* 45.6  MCV 90.3 91.8  PLT 298 048   Basic Metabolic Panel: Recent Labs  Lab 07/27/21 1159 07/28/21 0644  NA 135 136  K 4.6 5.2*  CL 104 109  CO2 26 21*  GLUCOSE 126* 168*  BUN 31* 35*  CREATININE 1.32* 1.19*  CALCIUM 9.4 8.7*  MG  --  1.9  PHOS  --  4.3   GFR: Estimated Creatinine Clearance: 54.6 mL/min (A) (by C-G formula based on SCr of 1.19 mg/dL (H)). Liver Function Tests: Recent Labs  Lab 07/27/21 1159  AST 42*  ALT 33  ALKPHOS 89  BILITOT 0.5  PROT 8.0  ALBUMIN 3.4*   No results for input(s): LIPASE, AMYLASE in Judith last 168 hours. No results for input(s): AMMONIA in Judith last 168 hours. Coagulation Profile: Recent Labs  Lab 07/27/21 1159 07/28/21 0644  INR 1.0 1.1   Cardiac Enzymes: No results for input(s): CKTOTAL, CKMB, CKMBINDEX, TROPONINI in Judith last 168 hours. BNP (last 3 results) No results for input(s): PROBNP in Judith last 8760 hours. HbA1C: No results for input(s): HGBA1C in Judith last 72 hours. CBG: Recent Labs  Lab 07/27/21 1203 07/27/21 1329 07/28/21 0124  GLUCAP 136* 86 134*   Lipid Profile: No results for input(s): CHOL, HDL, LDLCALC, TRIG, CHOLHDL, LDLDIRECT in Judith last 72 hours. Thyroid Function Tests: Recent Labs    07/27/21 1159 07/27/21 1620  TSH 6.743*  --   FREET4  --  0.90   Anemia Panel: No results for input(s): VITAMINB12, FOLATE, FERRITIN, TIBC, IRON, RETICCTPCT in Judith last 72 hours. Sepsis Labs: Recent Labs  Lab 07/27/21 1620 07/28/21 0131 07/28/21 0140  PROCALCITON  --  <0.10  --   LATICACIDVEN 3.4*  --  1.4    Recent Results (from Judith past 240 hour(s))  Resp Panel by RT-PCR (Flu A&B, Covid) Nasopharyngeal Swab     Status: None   Collection Time: 07/18/21  3:53 PM   Specimen: Nasopharyngeal Swab; Nasopharyngeal(NP) swabs in vial transport medium  Result Value Ref Range Status   SARS Coronavirus 2 by RT PCR NEGATIVE NEGATIVE Final     Comment: (NOTE) SARS-CoV-2 target nucleic acids are NOT DETECTED.  Judith SARS-CoV-2 RNA Shaw generally detectable in upper respiratory specimens during Judith acute phase of infection. Judith lowest concentration of SARS-CoV-2 viral copies this assay can detect Shaw 138 copies/mL. A negative result does not preclude SARS-Cov-2 infection and should not be used as Judith sole basis for treatment or other Shaw management decisions. A negative result may occur with  improper specimen collection/handling, submission of specimen other than nasopharyngeal swab, presence of viral mutation(s) within Judith areas targeted by this assay, and inadequate number of viral copies(<138 copies/mL). A negative result must be combined with clinical observations, Shaw history, and epidemiological information. Judith expected result Shaw Negative.  Fact Sheet for Patients:  EntrepreneurPulse.com.au  Fact Sheet for Healthcare Providers:  IncredibleEmployment.be  This test Shaw no t yet approved or cleared by [place] FDA and  has been authorized for detection and/or diagnosis of SARS-CoV-2 by FDA under an Emergency Use Authorization (EUA). This  EUA will remain  in effect (meaning this test can be used) for Judith duration of Judith COVID-19 declaration under Section 564(b)(1) of Judith Act, 21 U.S.C.section 360bbb-3(b)(1), unless Judith authorization Shaw terminated  or revoked sooner.       Influenza A by PCR NEGATIVE NEGATIVE Final   Influenza B by PCR NEGATIVE NEGATIVE Final    Comment: (NOTE) Judith Xpert Xpress SARS-CoV-2/FLU/RSV plus assay Shaw intended as an aid in Judith diagnosis of influenza from Nasopharyngeal swab specimens and should not be used as a sole basis for treatment. Nasal washings and aspirates are unacceptable for Xpert Xpress SARS-CoV-2/FLU/RSV testing.  Fact Sheet for Patients: EntrepreneurPulse.com.au  Fact Sheet for Healthcare  Providers: IncredibleEmployment.be  This test Shaw not yet approved or cleared by [place] FDA and has been authorized for detection and/or diagnosis of SARS-CoV-2 by FDA under an Emergency Use Authorization (EUA). This EUA will remain in effect (meaning this test can be used) for Judith duration of Judith COVID-19 declaration under Section 564(b)(1) of Judith Act, 21 U.S.C. section 360bbb-3(b)(1), unless Judith authorization Shaw terminated or revoked.  Performed at [facility], [address] [place], [place] [address]   Resp Panel by RT-PCR (Flu A&B, Covid) Nasopharyngeal Swab     Status: None   Collection Time: 07/27/21  4:20 PM   Specimen: Nasopharyngeal Swab; Nasopharyngeal(NP) swabs in vial transport medium  Result Value Ref Range Status   SARS Coronavirus 2 by RT PCR NEGATIVE NEGATIVE Final    Comment: (NOTE) SARS-CoV-2 target nucleic acids are NOT DETECTED.  Judith SARS-CoV-2 RNA Shaw generally detectable in upper respiratory specimens during Judith acute phase of infection. Judith lowest concentration of SARS-CoV-2 viral copies this assay can detect Shaw 138 copies/mL. A negative result does not preclude SARS-Cov-2 infection and should not be used as Judith sole basis for treatment or other Shaw management decisions. A negative result may occur with  improper specimen collection/handling, submission of specimen other than nasopharyngeal swab, presence of viral mutation(s) within Judith areas targeted by this assay, and inadequate number of viral copies(<138 copies/mL). A negative result must be combined with clinical observations, Shaw history, and epidemiological information. Judith expected result Shaw Negative.  Fact Sheet for Patients:  EntrepreneurPulse.com.au  Fact Sheet for Healthcare Providers:  IncredibleEmployment.be  This test Shaw no t yet approved or cleared by [place] FDA and  has been authorized for  detection and/or diagnosis of SARS-CoV-2 by FDA under an Emergency Use Authorization (EUA). This EUA will remain  in effect (meaning this test can be used) for Judith duration of Judith COVID-19 declaration under Section 564(b)(1) of Judith Act, 21 U.S.C.section 360bbb-3(b)(1), unless Judith authorization Shaw terminated  or revoked sooner.       Influenza A by PCR NEGATIVE NEGATIVE Final   Influenza B by PCR NEGATIVE NEGATIVE Final    Comment: (NOTE) Judith Xpert Xpress SARS-CoV-2/FLU/RSV plus assay Shaw intended as an aid in Judith diagnosis of influenza from Nasopharyngeal swab specimens and should not be used as a sole basis for treatment. Nasal washings and aspirates are unacceptable for Xpert Xpress SARS-CoV-2/FLU/RSV testing.  Fact Sheet for Patients: EntrepreneurPulse.com.au  Fact Sheet for Healthcare Providers: IncredibleEmployment.be  This test Shaw not yet approved or cleared by [place] FDA and has been authorized for detection and/or diagnosis of SARS-CoV-2 by FDA under an Emergency Use Authorization (EUA). This EUA will remain in effect (meaning this test can be used) for Judith duration of Judith COVID-19 declaration under Section 564(b)(1) of  Judith Act, 21 U.S.C. section 360bbb-3(b)(1), unless Judith authorization Shaw terminated or revoked.  Performed at [facility], [address] [place], [place] [address]   Blood Culture (routine x 2)     Status: None (Preliminary result)   Collection Time: 07/27/21  4:20 PM   Specimen: BLOOD  Result Value Ref Range Status   Specimen Description BLOOD LEFT ANTECUBITAL  Final   Special Requests   Final    BOTTLES DRAWN AEROBIC AND ANAEROBIC Blood Culture adequate volume   Culture   Final    NO GROWTH < 24 HOURS Performed at [facility], [address] [place], [place] [address]    Report Status PENDING  Incomplete  Blood Culture (routine x 2)     Status: None (Preliminary result)    Collection Time: 07/27/21  4:39 PM   Specimen: BLOOD  Result Value Ref Range Status   Specimen Description BLOOD BLOOD RIGHT FOREARM  Final   Special Requests   Final    BOTTLES DRAWN AEROBIC AND ANAEROBIC Blood Culture results may not be optimal due to an inadequate volume of blood received in culture bottles   Culture   Final    NO GROWTH < 24 HOURS Performed at [facility], [address] [place], [place] [address]    Report Status PENDING  Incomplete         Radiology Studies: CT HEAD WO CONTRAST  Result Date: 07/27/2021 CLINICAL DATA:  Neurological deficit EXAM: CT HEAD WITHOUT CONTRAST TECHNIQUE: Contiguous axial images were obtained from Judith base of Judith skull through Judith vertex without intravenous contrast. COMPARISON:  CT head dated April 30, 2021 FINDINGS: Brain: Chronic white matter ischemic change. No evidence of acute infarction, hemorrhage, hydrocephalus, extra-axial collection or mass lesion/mass effect. Vascular: No hyperdense vessel or unexpected calcification. Skull: Normal. Negative for fracture or focal lesion. Sinuses/Orbits: No acute finding. Other: None. IMPRESSION: No acute intracranial abnormality. Electronically Signed   By: Yetta Glassman M.D.   On: 07/27/2021 13:04   DG Chest Portable 1 View  Result Date: 07/27/2021 CLINICAL DATA:  Altered mental status EXAM: PORTABLE CHEST 1 VIEW COMPARISON:  Chest x-ray dated April 30, 2021 FINDINGS: Unchanged enlarged cardiac and mediastinal contours. Mild bilateral heterogeneous opacities. No large pleural effusion or pneumothorax. IMPRESSION: Mild bilateral heterogeneous opacities, possibly due to pulmonary edema. SP Electronically Signed   By: Yetta Glassman M.D.   On: 07/27/2021 14:36   CT Angio Chest/Abd/Pel for Dissection W and/or Wo Contrast  Result Date: 07/27/2021 CLINICAL DATA:  Abdominal pain, aortic dissection suspected EXAM: CT ANGIOGRAPHY CHEST, ABDOMEN AND PELVIS TECHNIQUE: Non-contrast  CT of Judith chest was initially obtained. Multidetector CT imaging through Judith chest, abdomen and pelvis was performed using Judith standard protocol during bolus administration of intravenous contrast. Multiplanar reconstructed images and MIPs were obtained and reviewed to evaluate Judith vascular anatomy. CONTRAST:  30m OMNIPAQUE IOHEXOL 350 MG/ML SOLN COMPARISON:  CT abdomen 07/18/2021 FINDINGS: CTA CHEST FINDINGS Cardiovascular: Thoracic aorta Shaw normal in caliber without evidence of intramural hematoma or dissection. Normal heart size. No pericardial effusion. Mediastinum/Nodes: No enlarged nodes. Imaged thyroid Shaw unremarkable. Lungs/Pleura: Imaged in expiration with patchy atelectasis bilaterally. No pleural effusion or pneumothorax. Musculoskeletal: Degenerative changes of Judith included spine. Review of Judith MIP images confirms Judith above findings. CTA ABDOMEN AND PELVIS FINDINGS VASCULAR Aorta: Normal caliber aorta without aneurysm, dissection, vasculitis or significant stenosis. Mild atherosclerotic plaque. Celiac: Patent.  No significant origin stenosis. SMA: Patent.  No significant origin stenosis.  Renals: Patent.  No significant origin stenosis. IMA: Patent.  No significant origin stenosis. Inflow: Patent.  Normal in caliber. Veins: Not well evaluated. Review of Judith MIP images confirms Judith above findings. NON-VASCULAR Hepatobiliary: No focal liver abnormality Shaw seen. Status post cholecystectomy. No biliary dilatation. Pancreas: Unremarkable. Spleen: Unremarkable. Adrenals/Urinary Tract: Adrenals are unremarkable. There Shaw a new small area of hypoenhancement at Judith lower pole of Judith right kidney. Unchanged left renal scarring with atrophy. Similar circumferential bladder wall thickening. Stomach/Bowel: Stomach Shaw within normal limits. Bowel Shaw normal in caliber. Normal appendix. Lymphatic: No enlarged nodes. Reproductive: Status post hysterectomy. No adnexal masses. Other: No free fluid. No acute abnormality of  Judith abdominal wall. Left lateral abdominal wall Shaw partially excluded. Musculoskeletal: Degenerative changes of Judith included spine. Degenerative changes of Judith hips. Review of Judith MIP images confirms Judith above findings. IMPRESSION: No evidence of aortic dissection. Small area of hypoenhancement at Judith lower pole of Judith right kidney suspicious for pyelonephritis. Circumferential bladder wall thickening may reflect cystitis or under distension. Mild aortic atherosclerosis. Electronically Signed   By: Macy Mis M.D.   On: 07/27/2021 15:21        Scheduled Meds:  aspirin EC  81 mg Oral Daily   donepezil  10 mg Oral QHS   DULoxetine  60 mg Oral Daily   heparin  5,000 Units Subcutaneous Q8H   [START ON 07/29/2021] insulin glargine-yfgn  22 Units Subcutaneous Daily   nystatin   Topical BID   QUEtiapine  25 mg Oral QHS   simvastatin  40 mg Oral q morning   sodium polystyrene  15 g Oral Once   Continuous Infusions:  cefTRIAXone (ROCEPHIN)  IV Stopped (07/28/21 1139)     LOS: 0 days    Time spent: 32 minutes    Sharen Hones, MD [facility]   To contact Judith attending provider between 7A-7P or Judith covering provider during after hours 7P-7A, please log into Judith web site [email or web address] and access using universal [place] password for that web site. If you do not have Judith password, please call Judith hospital operator.  07/28/2021, 1:23 PM

## 2021-07-28 NOTE — ED Notes (Signed)
Caregiver at bedside

## 2021-07-28 NOTE — ED Notes (Signed)
Patient yelling "Help! Help! Help! Push my leg over there!" Patient repositioned for comfort.

## 2021-07-28 NOTE — ED Notes (Signed)
Pt pulled all monitoring equipment off again, pulled of gown and blankets again, and was yelling to be sat up more. This RN informed pt that back of bed was as high as it can go and pt can not sit up any further with bed, this RN repositioned pillows and blankets behind pt to attempt to sit pt up more, pt continued to scream that they wanted to sit up higher/more straight. This RN again attempted to explain pt was sitting at almost a 90 degree angle and could not sit up any straighter. Pt covered with blanket and this RN requested pt keep blanket in place d/t need for blinds to be open for pt safety and to keep this pt modestly covered. Pt yelled they didn't want to be covered and would not keep it on. This RN requested pt keep blanket in place for modesty reasons. Bed in lowest position, call bell within reach, pt in view of RN station, pt connected to monitoring equipment.

## 2021-07-28 NOTE — Plan of Care (Signed)

## 2021-07-28 NOTE — ED Notes (Signed)
Messaged provider regarding pt's increased agitation. Requested medication to help calm patient due to her removing her own IV catheter, taking off nasal cannula and removing all monitoring equipment. Needed to replace IV catheter and put on all monitoring equipment for continued patient care and patient's safety.

## 2021-07-28 NOTE — ED Notes (Signed)
Patient yelling and asking for water. Mouth swabs provided to patient as she is currently NPO.

## 2021-07-28 NOTE — ED Notes (Signed)
Patient pulled one IV and pulled cardiac monitor, BP cuff and oxygen off. Continuously requesting to be pulled over. Patient repositioned in bed. Resp even, unlabored on RA. No distress noted at this time.

## 2021-07-28 NOTE — ED Notes (Addendum)
Called and spoke with sister, Robyn Haber who had called earlier requesting update. Gave nursing update and informed that she is still in the ED and we are waiting on an inpatient bed. Sister reported that she is the POA. Also informed me that Maryruth Eve, her caregiver would be coming by today as well as her other sister Donzetta Starch.

## 2021-07-28 NOTE — ED Notes (Signed)
This RN and Educational psychologist fully changed this pt's linen, chucks pads, cleaned pt, repositioned pt, hooked pt back up to monitoring equipment, and got this pt fresh water. Side rails up, bed in lowest position, call bell within reach, side table within reach. Pt given fresh warm blankets and purewick also changed.

## 2021-07-28 NOTE — ED Notes (Signed)
Patient screaming at staff to help her up despite staff multiple attempts to reposition. Patient pulled off gown and cardiac monitor again. Continuously yelling "Come help me up! What's wrong with you all?" Sharion Settler NP paged.

## 2021-07-28 NOTE — ED Notes (Signed)
Respiratory therapist at bedside attempting to place CPAP on patient. Patient became very agitated, pulled CPAP off, pulled nasal cannula off, patient continuously pulls gown off. Unable to place CPAP or [place] on patient at this time.

## 2021-07-28 NOTE — ED Notes (Signed)
Informed RN bed assigned 

## 2021-07-28 NOTE — Progress Notes (Signed)
Late entry: Approx 2330, attempted to place patient on CPAP. Patient refused to wear unit. Unit remains at patient's bedside. No distress noted.

## 2021-07-28 NOTE — ED Notes (Signed)
Patient yelling, asking to sit up in bed. Patient repositioned in bed per request.

## 2021-07-28 NOTE — Progress Notes (Signed)
OTC at 450. Will continue to monitor.

## 2021-07-28 NOTE — ED Notes (Signed)
Patient placed from ED stretcher onto hospital bed and positioned for comfort. Patient positioned on side per request. Patient now appears to be sleeping, allowed New Site to be placed. Oxygen saturation 97% on 2L. Purewick catheter in place. NS infusing at 100 ml/hr. No distress noted at this time.

## 2021-07-29 LAB — HEMOGLOBIN A1C
Hgb A1c MFr Bld: 8.5 % — ABNORMAL HIGH (ref 4.8–5.6)
Mean Plasma Glucose: 197 mg/dL

## 2021-07-29 LAB — CBC WITH DIFFERENTIAL/PLATELET
Abs Immature Granulocytes: 0.03 10*3/uL (ref 0.00–0.07)
Basophils Absolute: 0.1 10*3/uL (ref 0.0–0.1)
Basophils Relative: 1 %
Eosinophils Absolute: 0.4 10*3/uL (ref 0.0–0.5)
Eosinophils Relative: 6 %
HCT: 45.4 % (ref 36.0–46.0)
Hemoglobin: 14.5 g/dL (ref 12.0–15.0)
Immature Granulocytes: 0 %
Lymphocytes Relative: 18 %
Lymphs Abs: 1.3 10*3/uL (ref 0.7–4.0)
MCH: 28.9 pg (ref 26.0–34.0)
MCHC: 31.9 g/dL (ref 30.0–36.0)
MCV: 90.6 fL (ref 80.0–100.0)
Monocytes Absolute: 0.6 10*3/uL (ref 0.1–1.0)
Monocytes Relative: 8 %
Neutro Abs: 5.1 10*3/uL (ref 1.7–7.7)
Neutrophils Relative %: 67 %
Platelets: 249 10*3/uL (ref 150–400)
RBC: 5.01 MIL/uL (ref 3.87–5.11)
RDW: 13.7 % (ref 11.5–15.5)
WBC: 7.5 10*3/uL (ref 4.0–10.5)
nRBC: 0 % (ref 0.0–0.2)

## 2021-07-29 LAB — GLUCOSE, CAPILLARY
Glucose-Capillary: 173 mg/dL — ABNORMAL HIGH (ref 70–99)
Glucose-Capillary: 213 mg/dL — ABNORMAL HIGH (ref 70–99)
Glucose-Capillary: 172 mg/dL — ABNORMAL HIGH (ref 70–99)
Glucose-Capillary: 182 mg/dL — ABNORMAL HIGH (ref 70–99)

## 2021-07-29 LAB — BASIC METABOLIC PANEL
Anion gap: 6 (ref 5–15)
BUN: 30 mg/dL — ABNORMAL HIGH (ref 8–23)
CO2: 23 mmol/L (ref 22–32)
Calcium: 8.9 mg/dL (ref 8.9–10.3)
Chloride: 107 mmol/L (ref 98–111)
Creatinine, Ser: 1.06 mg/dL — ABNORMAL HIGH (ref 0.44–1.00)
GFR, Estimated: 56 mL/min — ABNORMAL LOW (ref >60)
Glucose, Bld: 183 mg/dL — ABNORMAL HIGH (ref 70–99)
Potassium: 5.2 mmol/L — ABNORMAL HIGH (ref 3.5–5.1)
Sodium: 136 mmol/L (ref 135–145)

## 2021-07-29 LAB — MAGNESIUM: Magnesium: 1.7 mg/dL (ref 1.7–2.4)

## 2021-07-29 MED ORDER — INSULIN ASPART 100 UNIT/ML IJ SOLN: 0.0000 [IU] | Freq: Every day | INTRAMUSCULAR | Status: DC

## 2021-07-29 MED ORDER — INSULIN ASPART 100 UNIT/ML IJ SOLN
0.0000 [IU] | Freq: Three times a day (TID) | INTRAMUSCULAR | Status: DC
Start: 2021-07-29 — End: 2021-08-02
  Administered 2021-07-29: 2 [IU] via SUBCUTANEOUS
  Administered 2021-07-29: 3 [IU] via SUBCUTANEOUS
  Administered 2021-07-30: 5 [IU] via SUBCUTANEOUS
  Administered 2021-07-30 – 2021-07-31 (×3): 2 [IU] via SUBCUTANEOUS
  Administered 2021-07-31: 12:00:00 3 [IU] via SUBCUTANEOUS
  Administered 2021-07-31: 18:00:00 2 [IU] via SUBCUTANEOUS
  Administered 2021-08-01: 3 [IU] via SUBCUTANEOUS
  Administered 2021-08-01: 1 [IU] via SUBCUTANEOUS
  Administered 2021-08-01: 2 [IU] via SUBCUTANEOUS
  Administered 2021-08-02: 3 [IU] via SUBCUTANEOUS
  Administered 2021-08-02: 2 [IU] via SUBCUTANEOUS
  Filled 2021-07-29 (×12): qty 1

## 2021-07-29 MED ORDER — INSULIN ASPART 100 UNIT/ML IJ SOLN
INTRAMUSCULAR | Status: AC
  Filled 2021-07-29: qty 1

## 2021-07-29 MED ORDER — SODIUM CHLORIDE 0.9 % IV SOLN
2.0000 g | Freq: Three times a day (TID) | INTRAVENOUS | Status: DC
  Administered 2021-07-29 – 2021-08-02 (×12): 2 g via INTRAVENOUS
  Filled 2021-07-29 (×14): qty 2

## 2021-07-29 MED ORDER — ORAL CARE MOUTH RINSE
15.0000 mL | Freq: Two times a day (BID) | OROMUCOSAL | Status: DC
  Administered 2021-07-29 – 2021-08-01 (×8): 15 mL via OROMUCOSAL

## 2021-07-29 MED ORDER — SODIUM ZIRCONIUM CYCLOSILICATE 10 G PO PACK
10.0000 g | PACK | Freq: Once | ORAL | Status: AC
  Administered 2021-07-29: 10 g via ORAL
  Filled 2021-07-29: qty 1

## 2021-07-29 NOTE — NC FL2 (Signed)
[place] LEVEL OF CARE SCREENING TOOL     IDENTIFICATION  Patient Name: Judith Shaw Birthdate: [birth year] Sex: female Admission Date (Current Location): 07/27/2021  [facility] and [place] Number:  Engineering geologist and Address:  [facility], [address] [place], [place] [address]      Provider Number: [id number]  Attending Physician Name and Address:  Sharen Hones, MD  Relative Name and Phone Number:  Abe People, sibling [phone]    Current Level of Care: Hospital Recommended Level of Care: [place] Prior Approval Number:    Date Approved/Denied:   PASRR Number: [id number]  Discharge Plan: SNF    Current Diagnoses: Patient Active Problem List   Diagnosis Date Noted   Acute metabolic encephalopathy 75/64/3329   Acute pyelonephritis 07/28/2021   Severe sepsis ([place]) 07/28/2021   Severe sepsis with acute organ dysfunction ([place]) 07/27/2021   Atherosclerosis of native arteries of the extremities with ulceration ([place]) 02/24/2020   S/P amputation of lesser toe, left ([place]) 01/13/2020   Dementia without behavioral disturbance ([place]) 12/16/2019   Acute osteomyelitis of left foot ([place]) 12/15/2019   Memory impairment 11/30/2019   Complaints of memory disturbance 11/20/2019   Diabetic ulcer of left midfoot associated with type 2 diabetes mellitus ([place]) 11/18/2019   Seizure-like activity ([place]) 07/15/2019   Intractable 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 06/19/2019   Uncontrolled type 2 diabetes mellitus with hyperglycemia, with long-term current use of insulin ([place]) 04/22/2019   Panic attacks 03/26/2019   Benign essential HTN 07/19/2018   Fatty liver 05/15/2018   Hx of adenomatous colonic polyps 05/15/2018   Type 2 diabetes mellitus with diabetic nephropathy, with long-term current use of insulin (HCC) 05/01/2018   Tinnitus, bilateral 04/05/2017   Concussion syndrome 04/03/2017   Concussion  without loss of consciousness 01/24/2017   Difficulty walking 01/24/2017   Headache disorder 01/24/2017   Numbness and tingling 01/24/2017   Postural urinary incontinence 01/24/2017   Sepsis ([place]) 09/21/2016   UTI (urinary tract infection) 09/21/2016   HTN (hypertension) 09/21/2016   Diabetes ([place]) 09/21/2016   Depression 09/21/2016   Health care maintenance 10/05/2015   Recurrent major depressive disorder, in full remission ([place]) 06/16/2014   DM (diabetes mellitus) type II controlled, neurological manifestation ([place]) 06/12/2014   Morbid obesity ([place]) 06/12/2014   Hyperlipidemia 03/10/2014   Chronic diastolic CHF (congestive heart failure) ([place]) 51/88/4166   H/O diastolic dysfunction 02/25/1600   Sleep apnea 03/10/2014   SOB (shortness of breath) 03/10/2014    Orientation RESPIRATION BLADDER Height & Weight     Self, Time, Situation, Place  O2 (2 liters) Continent Weight: 114 kg Height:  5\' 5"  (165.1 cm)  BEHAVIORAL SYMPTOMS/MOOD NEUROLOGICAL BOWEL NUTRITION STATUS      Continent Diet (regular)  AMBULATORY STATUS COMMUNICATION OF NEEDS Skin   Extensive Assist Verbally Normal                       Personal Care Assistance Level of Assistance  Bathing, Feeding, Dressing Bathing Assistance: Limited assistance Feeding assistance: Independent Dressing Assistance: Limited assistance     Functional Limitations Info             SPECIAL CARE FACTORS FREQUENCY  PT (By licensed PT), OT (By licensed OT)     PT Frequency: 5 times per week OT Frequency: 5 times per week            Contractures Contractures Info: Not present  Additional Factors Info  Code Status, Allergies Code Status Info: Full code Allergies Info: COdeine           Current Medications (07/29/2021):  This is the current hospital active medication list Current Facility-Administered Medications  Medication Dose Route Frequency Provider Last Rate Last Admin   insulin aspart (novoLOG) 100  UNIT/ML injection            acetaminophen (TYLENOL) tablet 650 mg  650 mg Oral Q6H PRN Cox, Amy N, DO       Or   acetaminophen (TYLENOL) suppository 650 mg  650 mg Rectal Q6H PRN Cox, Amy N, DO       aspirin EC tablet 81 mg  81 mg Oral Daily Cox, Amy N, DO   81 mg at 07/29/21 1007   ceFEPIme (MAXIPIME) 2 g in sodium chloride 0.9 % 100 mL IVPB  2 g Intravenous Q8H Sharen Hones, MD       donepezil (ARICEPT) tablet 10 mg  10 mg Oral QHS Cox, Amy N, DO   10 mg at 07/28/21 2218   DULoxetine (CYMBALTA) DR capsule 60 mg  60 mg Oral Daily Cox, Amy N, DO   60 mg at 07/29/21 0952   enoxaparin (LOVENOX) injection 57.5 mg  0.5 mg/kg Subcutaneous Q24H Sharen Hones, MD   57.5 mg at 07/29/21 1238   haloperidol lactate (HALDOL) injection 2 mg  2 mg Intramuscular Q6H PRN Sharen Hones, MD   2 mg at 07/29/21 0618   insulin aspart (novoLOG) injection 0-5 Units  0-5 Units Subcutaneous QHS Sharen Hones, MD       insulin aspart (novoLOG) injection 0-9 Units  0-9 Units Subcutaneous TID WC Sharen Hones, MD   3 Units at 07/29/21 1237   insulin glargine-yfgn (SEMGLEE) injection 22 Units  22 Units Subcutaneous Daily Sharen Hones, MD   22 Units at 07/29/21 7680   lactated ringers infusion   Intravenous Continuous Sharen Hones, MD   Paused at 07/29/21 1242   MEDLINE mouth rinse  15 mL Mouth Rinse BID Sharen Hones, MD   15 mL at 07/29/21 8811   nystatin (MYCOSTATIN/NYSTOP) topical powder   Topical BID Cox, Amy N, DO   Given at 07/29/21 0953   ondansetron (ZOFRAN) tablet 4 mg  4 mg Oral Q6H PRN Cox, Amy N, DO       Or   ondansetron (ZOFRAN) injection 4 mg  4 mg Intravenous Q6H PRN Cox, Amy N, DO       QUEtiapine (SEROQUEL) tablet 25 mg  25 mg Oral QHS Sharen Hones, MD   25 mg at 07/28/21 2218   simvastatin (ZOCOR) tablet 40 mg  40 mg Oral q morning Cox, Amy N, DO   40 mg at 07/29/21 1018     Discharge Medications: Please see discharge summary for a list of discharge medications.  Relevant Imaging Results:  Relevant  Lab Results:   Additional Information SS# [id number]  Conception Oms, RN

## 2021-07-29 NOTE — Evaluation (Signed)
Occupational Therapy Evaluation Patient Details Name: Judith Shaw MRN: [id number] DOB: [birth year] Today's Date: 07/29/2021   History of Present Illness Judith Shaw is a 48yoF who comes [facility] on 11/30 from outpatient provider for decreased PO intake c anorexia, weakness, unable to walk, multiple falls. CT head is negative and chest x-ray does not show any obvious infiltrate.  A CT angio of the chest abdomen pelvis was obtained by the previous provider which is read as may be a right-sided pyelo and cystitis.  She was covered with cefepime.  Required Haldol and Versed for significant agitation.   Clinical Impression   Patient presenting with decreased Ind in self care, balance, functional mobility/transfers,endurance, and safety awareness. Patient reports being mod I with self care and functional mobility with use of RW PTA. Pt endorses living with her brother. She does not drive and relies on friends for appointments and groceries. Patient currently functioning at heavy max A to stand from recliner chair with therapist front facing pt. Pt is unable to take steps or remain standing longer than ~ 20 seconds. She fatigues very quickly. Grooming tasks performed while pt is seated with set up A to obtain all needed items.  Patient will benefit from acute OT to increase overall independence in the areas of ADLs, functional mobility, and safety awareness in order to safely discharge to next venue of care.      Recommendations for follow up therapy are one component of a multi-disciplinary discharge planning process, led by the attending physician.  Recommendations may be updated based on patient status, additional functional criteria and insurance authorization.   Follow Up Recommendations  Skilled nursing-short term rehab (<3 hours/day)    Assistance Recommended at Discharge Frequent or constant Supervision/Assistance  Functional Status Assessment  Patient has had a recent decline in their functional  status and demonstrates the ability to make significant improvements in function in a reasonable and predictable amount of time.  Equipment Recommendations  Other (comment) (defer to next venue of care)       Precautions / Restrictions Precautions Precautions: Fall Restrictions Weight Bearing Restrictions: No      Mobility Bed Mobility               General bed mobility comments: Pt seated in recliner chair    Transfers Overall transfer level: Needs assistance Equipment used: 1 person hand held assist Transfers: Sit to/from Stand Sit to Stand: Max assist           General transfer comment: heavy max A to come into standing from recliner chair.      Balance Overall balance assessment: Needs assistance                                         ADL either performed or assessed with clinical judgement   ADL Overall ADL's : Needs assistance/impaired     Grooming: Wash/dry hands;Wash/dry face;Sitting;Set up;Supervision/safety           Upper Body Dressing : Minimal assistance   Lower Body Dressing: Total assistance                       Vision Patient Visual Report: No change from baseline              Pertinent Vitals/Pain Pain Assessment: No/denies pain     Hand Dominance Right   Extremity/Trunk Assessment  Upper Extremity Assessment Upper Extremity Assessment: Generalized weakness   Lower Extremity Assessment Lower Extremity Assessment: Generalized weakness       Communication Communication Communication: No difficulties   Cognition Arousal/Alertness: Awake/alert Behavior During Therapy: WFL for tasks assessed/performed Overall Cognitive Status: Within Functional Limits for tasks assessed                                          Exercises Other Exercises Other Exercises: Seated LAQ 1x10x1secH; extensive cues for quality Other Exercises: Seated hip extension, manually resisted from end range  flexion to floor 1x10 bilat (left side severely weak)        Home Living Family/patient expects to be discharged to:: Private residence Living Arrangements: Spouse/significant other;Other relatives (brother) Available Help at Discharge: Family (brother) Type of Home: House Home Access: Stairs to enter Technical brewer of Steps: 1-2 Entrance Stairs-Rails: Right Home Layout: One level     Bathroom Shower/Tub: Tub/shower unit         Home Equipment: Conservation officer, nature (2 wheels);Shower seat          Prior Functioning/Environment Prior Level of Function : Needs assist             Mobility Comments: uses RW all the time, balance issues; doesnt drive, uses transportation. ADLs Comments: Pt reports being mod I with self care tasks. Pt has assistance with IADLs. She reports people bring meals to the home.        OT Problem List: Decreased strength;Decreased activity tolerance;Impaired balance (sitting and/or standing);Decreased safety awareness;Decreased knowledge of use of DME or AE      OT Treatment/Interventions: Self-care/ADL training;Therapeutic exercise;Therapeutic activities;Energy conservation;DME and/or AE instruction;Patient/family education;Balance training    OT Goals(Current goals can be found in the care plan section) Acute Rehab OT Goals Patient Stated Goal: to get stronger OT Goal Formulation: With patient Time For Goal Achievement: 08/12/21 Potential to Achieve Goals: Good  OT Frequency: Min 2X/week   Barriers to D/C:    none known at this time          AM-PAC OT "6 Clicks" Daily Activity     Outcome Measure Help from another person eating meals?: None Help from another person taking care of personal grooming?: None Help from another person toileting, which includes using toliet, bedpan, or urinal?: Total Help from another person bathing (including washing, rinsing, drying)?: Total Help from another person to put on and taking off regular  upper body clothing?: A Lot Help from another person to put on and taking off regular lower body clothing?: Total 6 Click Score: 13   End of Session Nurse Communication: Mobility status  Activity Tolerance: Patient limited by fatigue Patient left: in chair;with call bell/phone within reach;with chair alarm set  OT Visit Diagnosis: Unsteadiness on feet (R26.81);Muscle weakness (generalized) (M62.81)                Time: 8756-4332 OT Time Calculation (min): 17 min Charges:  OT General Charges $OT Visit: 1 Visit OT Evaluation $OT Eval Moderate Complexity: 1 Mod OT Treatments $Self Care/Home Management : 8-22 mins  Darleen Crocker, MS, OTR/L , CBIS ascom [phone]  07/29/21, 1:47 PM

## 2021-07-29 NOTE — Evaluation (Signed)
Physical Therapy Evaluation Patient Details Name: Judith Shaw MRN: [id number] DOB: 03-06-1951 Today's Date: 07/29/2021  History of Present Illness  Judith Shaw is a 20yoF who [facility] from outpatient provider for decreased PO intake c anorexia, weakness, unable to walk, multiple falls. CT head is negative and chest x-ray does not show any obvious infiltrate.  A CT angio of the chest abdomen pelvis was obtained by the previous provider which is read as may be a right-sided pyelo and cystitis.  She was covered with cefepime.  Required Haldol and Versed for significant agitation.  Clinical Impression  Pt admitted with above diagnosis. Pt currently with functional limitations due to the deficits listed below (see "PT Problem List"). Upon entry, pt in recliner, awake and agreeable to participate. Pt denies pain but reports feeling sluggish and hypoactive, says her mental clarity is improved and close to baseline. The pt is alert, pleasant, interactive, and able to provide info regarding prior level of function, both in tolerance and independence. Pt severely weak today, unable to sit up forward from recliner without BUE, maxA dependent transfer to rise to standing briefly, unable to remain up. Pt has significant sensory loss from knees down, unclear etiology/chronicity, although pt reports it as new. Pt reports her brother would not be able to provide this level of physical assist at DC. Patient's performance this date reveals decreased ability, independence, and tolerance in performing all basic mobility required for performance of activities of daily living. Pt requires additional DME, close physical assistance, and cues for safe participate in mobility. Pt will benefit from skilled PT intervention to increase independence and safety with basic mobility in preparation for discharge to the venue listed below.          Recommendations for follow up therapy are one component of a multi-disciplinary  discharge planning process, led by the attending physician.  Recommendations may be updated based on patient status, additional functional criteria and insurance authorization.  Follow Up Recommendations Skilled nursing-short term rehab (<3 hours/day)    Assistance Recommended at Discharge None  Functional Status Assessment Patient has had a recent decline in their functional status and demonstrates the ability to make significant improvements in function in a reasonable and predictable amount of time.  Equipment Recommendations  Other (comment)    Recommendations for Other Services       Precautions / Restrictions Precautions Precautions: Fall Restrictions Weight Bearing Restrictions: No      Mobility  Bed Mobility               General bed mobility comments: in chair upon entry    Transfers Overall transfer level: Needs assistance Equipment used: Rolling walker (2 wheels) Transfers: Sit to/from Stand Sit to Stand: Max assist;+2 safety/equipment           General transfer comment: maxA depednet STS transfer with knee block, pt not comfortabel remaining up to trial stand    Ambulation/Gait Ambulation/Gait assistance:  (unable)                Stairs            Wheelchair Mobility    Modified Rankin (Stroke Patients Only)       Balance                                             Pertinent Vitals/Pain  Pain Assessment: No/denies pain    Home Living Family/patient expects to be discharged to:: Private residence Living Arrangements: Alone Available Help at Discharge: Family (brother) Type of Home: House Home Access: Stairs to enter   Technical brewer of Steps: 1-2   Home Layout: One level Home Equipment: Conservation officer, nature (2 wheels)      Prior Function Prior Level of Function : Needs assist             Mobility Comments: uses RW all the time, balance issues; doesnt drive, uses transportation. ADLs  Comments: modI     Hand Dominance        Extremity/Trunk Assessment   Upper Extremity Assessment Upper Extremity Assessment: Generalized weakness (bilat elbows 4/5 grossly)    Lower Extremity Assessment Lower Extremity Assessment: Generalized weakness (severe weakness quads 3+/5 bilat, seated hip extension 3+/5 Rt; 3/5 Left; siognificant sensory loss from knees to feet bilat)       Communication      Cognition Arousal/Alertness: Awake/alert (drowsy, largely intact with brief intermittent confusion) Behavior During Therapy: WFL for tasks assessed/performed Overall Cognitive Status: Within Functional Limits for tasks assessed                                          General Comments      Exercises Other Exercises Other Exercises: Seated LAQ 1x10x1secH; extensive cues for quality Other Exercises: Seated hip extension, manually resisted from end range flexion to floor 1x10 bilat (left side severely weak)   Assessment/Plan    PT Assessment Patient needs continued PT services  PT Problem List Decreased strength;Decreased range of motion;Decreased cognition;Obesity;Decreased activity tolerance;Decreased balance;Decreased mobility;Decreased knowledge of precautions;Impaired sensation       PT Treatment Interventions DME instruction;Gait training;Stair training;Functional mobility training;Therapeutic activities;Therapeutic exercise;Patient/family education;Balance training;Neuromuscular re-education;Cognitive remediation    PT Goals (Current goals can be found in the Care Plan section)  Acute Rehab PT Goals Patient Stated Goal: return to ability well enough for DC to home. PT Goal Formulation: With patient Time For Goal Achievement: 08/12/21 Potential to Achieve Goals: Good    Frequency Min 2X/week   Barriers to discharge Inaccessible home environment      Co-evaluation               AM-PAC PT "6 Clicks" Mobility  Outcome Measure Help needed  turning from your back to your side while in a flat bed without using bedrails?: Total Help needed moving from lying on your back to sitting on the side of a flat bed without using bedrails?: Total Help needed moving to and from a bed to a chair (including a wheelchair)?: Total Help needed standing up from a chair using your arms (e.g., wheelchair or bedside chair)?: Total Help needed to walk in hospital room?: Total Help needed climbing 3-5 steps with a railing? : Total 6 Click Score: 6    End of Session   Activity Tolerance: Patient tolerated treatment well;Patient limited by fatigue Patient left: in chair;with nursing/sitter in room;with call bell/phone within reach Nurse Communication: Mobility status PT Visit Diagnosis: Unsteadiness on feet (R26.81);Other abnormalities of gait and mobility (R26.89);Muscle weakness (generalized) (M62.81);Repeated falls (R29.6);History of falling (Z91.81);Difficulty in walking, not elsewhere classified (R26.2);Other symptoms and signs involving the nervous system (R29.898)    Time: 2637-8588 PT Time Calculation (min) (ACUTE ONLY): 20 min   Charges:   PT Evaluation $PT Eval High Complexity: 1  High PT Treatments $Therapeutic Exercise: 8-22 mins      12:15 PM, 07/29/21 Etta Grandchild, PT, DPT Physical Therapist - [facility]  [phone] ([place])     [place] C 07/29/2021, 12:11 PM

## 2021-07-29 NOTE — Progress Notes (Signed)
Pt QTC at 430. Will continue to monitor.

## 2021-07-29 NOTE — Progress Notes (Addendum)
Pt BP at 178 79 MAP 103 HR 76 at 2021. At 0340 BP at 170/62 MAP 94 HR. 82. NP Randol Kern made aware. Will continue to monitor.  Update 0502: No new order place. Will continue to monitor.

## 2021-07-29 NOTE — TOC Progression Note (Signed)
Transition of Care [facility]) - Progression Note    Patient Details  Name: Judith Shaw MRN: [id number] Date of Birth: 08-Aug-1951  Transition of Care [facility]) CM/SW [place], RN Phone Number: 07/29/2021, 1:45 PM  Clinical Narrative:    Met with the patient to discuss DC plan and needs, she lives at home with her brother and does not have assistance, the patient is agreeable to go to [facility], [place] sent        Expected Discharge Plan and Services                                                 Social Determinants of Health (SDOH) Interventions    Readmission Risk Interventions No flowsheet data found.

## 2021-07-29 NOTE — Progress Notes (Signed)
PROGRESS NOTE    Judith Shaw  UDJ:[id number] DOB: 09-02-1950 DOA: 07/27/2021 PCP: Pcp, No    Brief Narrative:   Judith Shaw is a 70 y.o. female with medical history significant for obstructive sleep apnea, obesity, anxiety, hypertension, history of fatty liver, hyperlipidemia, history of uncontrolled insulin-dependent diabetes mellitus type 2, who presents emergency department for chief concerns of altered mental status. Patient diagnosed with UTI and severe sepsis.  Given fluids and antibiotics.  Assessment & Plan:   Principal Problem:   Severe sepsis with acute organ dysfunction (HCC) Active Problems:   Sepsis ([place])   UTI (urinary tract infection)   Sleep apnea   Type 2 diabetes mellitus with diabetic nephropathy, with long-term current use of insulin (HCC)   Acute metabolic encephalopathy   Acute pyelonephritis   Severe sepsis (HCC)  Severe sepsis secondary to pyelonephritis due to Pseudomonas Acute pyelonephritis due to Pseudomonas  Acute metabolic encephalopathy secondary to pyelonephritis. Patient mental status much improved today, she has some baseline confusion.  Otherwise improved. Urine culture grew Pseudomonas aeruginosa.  Blood cultures so far negative. Antibiotic changed to cefepime.   Acute kidney injury secondary to severe sepsis Metabolic acidosis secondary to renal failure. Hyperkalemia. Renal function continued to improve after giving fluids.  Potassium level still elevated, will give a dose of Lokelma. Continue IV fluid for another day.  Morbid obesity. Obstructive sleep apnea. Continue CPAP while asleep.  Chronic dementia. Continue home medicines per  Type 2 diabetes. Continue decreased dose of insulin glargine, add sliding scale insulin.  Anticipate increased dose tomorrow.   DVT prophylaxis: Lovenox Code Status: full Family Communication: sister updated Disposition Plan:    Status is: Inpatient  Remains inpatient appropriate because:  severity of disease, iv antibiotics        I/O last 3 completed shifts: In: 3218.8 [I.V.:2718.8; IV Piggyback:500] Out: 700 [Urine:700] No intake/output data recorded.     Consultants:  none  Procedures: none  Antimicrobials: cefepime  Subjective: Patient improved significantly this morning, she was able to eat breakfast by herself. She does not seem to have significant confusion today. She slept well last night.  Appears that she does not tolerate the CPAP. Denies any short of breath or cough. No dysuria hematuria No fever or chills.  Objective: Vitals:   07/28/21 1504 07/28/21 2021 07/29/21 0340 07/29/21 0730  BP: (!) 149/55 (!) 178/79 (!) 170/62 (!) 160/81  Pulse: 67 75 80 76  Resp: 16 20 19 18   Temp: 97.9 F (36.6 C) 97.9 F (36.6 C) 97.7 F (36.5 C) (!) 97.5 F (36.4 C)  TempSrc:  Oral Oral   SpO2: 95% 100% 95% 99%  Weight:      Height:        Intake/Output Summary (Last 24 hours) at 07/29/2021 1111 Last data filed at 07/29/2021 0700 Gross per 24 hour  Intake 1627.79 ml  Output 300 ml  Net 1327.79 ml   Filed Weights   07/27/21 1158  Weight: 114 kg    Examination:  General exam: Appears calm and comfortable  Respiratory system: Clear to auscultation. Respiratory effort normal. Cardiovascular system: S1 & S2 heard, RRR. No JVD, murmurs, rubs, gallops or clicks. No pedal edema. Gastrointestinal system: Abdomen is nondistended, soft and nontender. No organomegaly or masses felt. Normal bowel sounds heard. Central nervous system: Alert and oriented x2. No focal neurological deficits. Extremities: Symmetric 5 x 5 power. Skin: No rashes, lesions or ulcers Psychiatry: Mood & affect appropriate.     Data Reviewed:  I have personally reviewed following labs and imaging studies  CBC: Recent Labs  Lab 07/27/21 1159 07/28/21 0644 07/29/21 0238  WBC 9.7 8.8 7.5  NEUTROABS 5.5 5.8 5.1  HGB 16.1* 15.0 14.5  HCT 49.1* 45.6 45.4  MCV 90.3 91.8 90.6   PLT 298 252 176   Basic Metabolic Panel: Recent Labs  Lab 07/27/21 1159 07/28/21 0644 07/29/21 0238  NA 135 136 136  K 4.6 5.2* 5.2*  CL 104 109 107  CO2 26 21* 23  GLUCOSE 126* 168* 183*  BUN 31* 35* 30*  CREATININE 1.32* 1.19* 1.06*  CALCIUM 9.4 8.7* 8.9  MG  --  1.9 1.7  PHOS  --  4.3  --    GFR: Estimated Creatinine Clearance: 61.3 mL/min (A) (by C-G formula based on SCr of 1.06 mg/dL (H)). Liver Function Tests: Recent Labs  Lab 07/27/21 1159  AST 42*  ALT 33  ALKPHOS 89  BILITOT 0.5  PROT 8.0  ALBUMIN 3.4*   No results for input(s): LIPASE, AMYLASE in the last 168 hours. No results for input(s): AMMONIA in the last 168 hours. Coagulation Profile: Recent Labs  Lab 07/27/21 1159 07/28/21 0644  INR 1.0 1.1   Cardiac Enzymes: No results for input(s): CKTOTAL, CKMB, CKMBINDEX, TROPONINI in the last 168 hours. BNP (last 3 results) No results for input(s): PROBNP in the last 8760 hours. HbA1C: No results for input(s): HGBA1C in the last 72 hours. CBG: Recent Labs  Lab 07/27/21 1203 07/27/21 1329 07/28/21 0124 07/28/21 1643 07/29/21 0749  GLUCAP 136* 86 134* 163* 173*   Lipid Profile: No results for input(s): CHOL, HDL, LDLCALC, TRIG, CHOLHDL, LDLDIRECT in the last 72 hours. Thyroid Function Tests: Recent Labs    07/27/21 1159 07/27/21 1620  TSH 6.743*  --   FREET4  --  0.90   Anemia Panel: No results for input(s): VITAMINB12, FOLATE, FERRITIN, TIBC, IRON, RETICCTPCT in the last 72 hours. Sepsis Labs: Recent Labs  Lab 07/27/21 1620 07/28/21 0131 07/28/21 0140  PROCALCITON  --  <0.10  --   LATICACIDVEN 3.4*  --  1.4    Recent Results (from the past 240 hour(s))  Resp Panel by RT-PCR (Flu A&B, Covid) Nasopharyngeal Swab     Status: None   Collection Time: 07/27/21  4:20 PM   Specimen: Nasopharyngeal Swab; Nasopharyngeal(NP) swabs in vial transport medium  Result Value Ref Range Status   SARS Coronavirus 2 by RT PCR NEGATIVE NEGATIVE  Final    Comment: (NOTE) SARS-CoV-2 target nucleic acids are NOT DETECTED.  The SARS-CoV-2 RNA is generally detectable in upper respiratory specimens during the acute phase of infection. The lowest concentration of SARS-CoV-2 viral copies this assay can detect is 138 copies/mL. A negative result does not preclude SARS-Cov-2 infection and should not be used as the sole basis for treatment or other patient management decisions. A negative result may occur with  improper specimen collection/handling, submission of specimen other than nasopharyngeal swab, presence of viral mutation(s) within the areas targeted by this assay, and inadequate number of viral copies(<138 copies/mL). A negative result must be combined with clinical observations, patient history, and epidemiological information. The expected result is Negative.  Fact Sheet for Patients:  EntrepreneurPulse.com.au  Fact Sheet for Healthcare Providers:  IncredibleEmployment.be  This test is no t yet approved or cleared by the [place] FDA and  has been authorized for detection and/or diagnosis of SARS-CoV-2 by FDA under an Emergency Use Authorization (EUA). This EUA will remain  in effect (  meaning this test can be used) for the duration of the COVID-19 declaration under Section 564(b)(1) of the Act, 21 U.S.C.section 360bbb-3(b)(1), unless the authorization is terminated  or revoked sooner.       Influenza A by PCR NEGATIVE NEGATIVE Final   Influenza B by PCR NEGATIVE NEGATIVE Final    Comment: (NOTE) The Xpert Xpress SARS-CoV-2/FLU/RSV plus assay is intended as an aid in the diagnosis of influenza from Nasopharyngeal swab specimens and should not be used as a sole basis for treatment. Nasal washings and aspirates are unacceptable for Xpert Xpress SARS-CoV-2/FLU/RSV testing.  Fact Sheet for Patients: EntrepreneurPulse.com.au  Fact Sheet for Healthcare  Providers: IncredibleEmployment.be  This test is not yet approved or cleared by the [place] FDA and has been authorized for detection and/or diagnosis of SARS-CoV-2 by FDA under an Emergency Use Authorization (EUA). This EUA will remain in effect (meaning this test can be used) for the duration of the COVID-19 declaration under Section 564(b)(1) of the Act, 21 U.S.C. section 360bbb-3(b)(1), unless the authorization is terminated or revoked.  Performed at [facility], [address] [place], [place] [address]   Blood Culture (routine x 2)     Status: None (Preliminary result)   Collection Time: 07/27/21  4:20 PM   Specimen: BLOOD  Result Value Ref Range Status   Specimen Description BLOOD LEFT ANTECUBITAL  Final   Special Requests   Final    BOTTLES DRAWN AEROBIC AND ANAEROBIC Blood Culture adequate volume   Culture   Final    NO GROWTH 2 DAYS Performed at [facility], [address], [place], [place] [address]    Report Status PENDING  Incomplete  Blood Culture (routine x 2)     Status: None (Preliminary result)   Collection Time: 07/27/21  4:39 PM   Specimen: BLOOD  Result Value Ref Range Status   Specimen Description BLOOD BLOOD RIGHT FOREARM  Final   Special Requests   Final    BOTTLES DRAWN AEROBIC AND ANAEROBIC Blood Culture results may not be optimal due to an inadequate volume of blood received in culture bottles   Culture   Final    NO GROWTH 2 DAYS Performed at [facility], [address], [place], [place] [address]    Report Status PENDING  Incomplete  Urine Culture     Status: Abnormal (Preliminary result)   Collection Time: 07/27/21  5:13 PM   Specimen: Urine, Catheterized  Result Value Ref Range Status   Specimen Description   Final    URINE, CATHETERIZED Performed at [facility], [address] [place], [place] [address]    Special Requests   Final    NONE Performed at [facility], [address] [place], [place] [address]    Culture (A)  Final    >=100,000 COLONIES/mL PSEUDOMONAS AERUGINOSA SUSCEPTIBILITIES TO FOLLOW Performed at [facility], [place] [address] [place], [place] [address]    Report Status PENDING  Incomplete         Radiology Studies: CT HEAD WO CONTRAST  Result Date: 07/27/2021 CLINICAL DATA:  Neurological deficit EXAM: CT HEAD WITHOUT CONTRAST TECHNIQUE: Contiguous axial images were obtained from the base of the skull through the vertex without intravenous contrast. COMPARISON:  CT head dated April 30, 2021 FINDINGS: Brain: Chronic white matter ischemic change. No evidence of acute infarction, hemorrhage, hydrocephalus, extra-axial collection or mass lesion/mass effect. Vascular: No hyperdense vessel or unexpected calcification. Skull: Normal. Negative for fracture  or focal lesion. Sinuses/Orbits: No acute finding. Other: None. IMPRESSION: No acute intracranial abnormality. Electronically Signed   By: Yetta Glassman M.D.   On: 07/27/2021 13:04   DG Chest Portable 1 View  Result Date: 07/27/2021 CLINICAL DATA:  Altered mental status EXAM: PORTABLE CHEST 1 VIEW COMPARISON:  Chest x-ray dated April 30, 2021 FINDINGS: Unchanged enlarged cardiac and mediastinal contours. Mild bilateral heterogeneous opacities. No large pleural effusion or pneumothorax. IMPRESSION: Mild bilateral heterogeneous opacities, possibly due to pulmonary edema. SP Electronically Signed   By: Yetta Glassman M.D.   On: 07/27/2021 14:36   CT Angio Chest/Abd/Pel for Dissection W and/or Wo Contrast  Result Date: 07/27/2021 CLINICAL DATA:  Abdominal pain, aortic dissection suspected EXAM: CT ANGIOGRAPHY CHEST, ABDOMEN AND PELVIS TECHNIQUE: Non-contrast CT of the chest was initially obtained. Multidetector CT imaging through the chest, abdomen and pelvis was performed using the standard protocol during bolus administration of intravenous contrast. Multiplanar  reconstructed images and MIPs were obtained and reviewed to evaluate the vascular anatomy. CONTRAST:  28mL OMNIPAQUE IOHEXOL 350 MG/ML SOLN COMPARISON:  CT abdomen 07/18/2021 FINDINGS: CTA CHEST FINDINGS Cardiovascular: Thoracic aorta is normal in caliber without evidence of intramural hematoma or dissection. Normal heart size. No pericardial effusion. Mediastinum/Nodes: No enlarged nodes. Imaged thyroid is unremarkable. Lungs/Pleura: Imaged in expiration with patchy atelectasis bilaterally. No pleural effusion or pneumothorax. Musculoskeletal: Degenerative changes of the included spine. Review of the MIP images confirms the above findings. CTA ABDOMEN AND PELVIS FINDINGS VASCULAR Aorta: Normal caliber aorta without aneurysm, dissection, vasculitis or significant stenosis. Mild atherosclerotic plaque. Celiac: Patent.  No significant origin stenosis. SMA: Patent.  No significant origin stenosis. Renals: Patent.  No significant origin stenosis. IMA: Patent.  No significant origin stenosis. Inflow: Patent.  Normal in caliber. Veins: Not well evaluated. Review of the MIP images confirms the above findings. NON-VASCULAR Hepatobiliary: No focal liver abnormality is seen. Status post cholecystectomy. No biliary dilatation. Pancreas: Unremarkable. Spleen: Unremarkable. Adrenals/Urinary Tract: Adrenals are unremarkable. There is a new small area of hypoenhancement at the lower pole of the right kidney. Unchanged left renal scarring with atrophy. Similar circumferential bladder wall thickening. Stomach/Bowel: Stomach is within normal limits. Bowel is normal in caliber. Normal appendix. Lymphatic: No enlarged nodes. Reproductive: Status post hysterectomy. No adnexal masses. Other: No free fluid. No acute abnormality of the abdominal wall. Left lateral abdominal wall is partially excluded. Musculoskeletal: Degenerative changes of the included spine. Degenerative changes of the hips. Review of the MIP images confirms the above  findings. IMPRESSION: No evidence of aortic dissection. Small area of hypoenhancement at the lower pole of the right kidney suspicious for pyelonephritis. Circumferential bladder wall thickening may reflect cystitis or under distension. Mild aortic atherosclerosis. Electronically Signed   By: Macy Mis M.D.   On: 07/27/2021 15:21        Scheduled Meds:  aspirin EC  81 mg Oral Daily   donepezil  10 mg Oral QHS   DULoxetine  60 mg Oral Daily   enoxaparin (LOVENOX) injection  0.5 mg/kg Subcutaneous Q24H   insulin glargine-yfgn  22 Units Subcutaneous Daily   mouth rinse  15 mL Mouth Rinse BID   nystatin   Topical BID   QUEtiapine  25 mg Oral QHS   simvastatin  40 mg Oral q morning   sodium zirconium cyclosilicate  10 g Oral Once   Continuous Infusions:  ceFEPime (MAXIPIME) IV     lactated ringers 100 mL/hr at 07/29/21 0311     LOS: 1 day  Time spent: 32 minutes, more than 50% time involved in direct patient care.    Sharen Hones, MD [facility]   To contact the attending provider between 7A-7P or the covering provider during after hours 7P-7A, please log into the web site [email or web address] and access using universal [place] password for that web site. If you do not have the password, please call the hospital operator.  07/29/2021, 11:11 AM

## 2021-07-29 NOTE — Plan of Care (Signed)
  Problem: Education: Goal: Knowledge of General Education information will improve Description: Including pain rating scale, medication(s)/side effects and non-pharmacologic comfort measures Outcome: Progressing   Problem: Coping: Goal: Level of anxiety will decrease Outcome: Progressing   Problem: Elimination: Goal: Will not experience complications related to bowel motility Outcome: Progressing   Problem: Elimination: Goal: Will not experience complications related to urinary retention Outcome: Progressing   Problem: Pain Managment: Goal: General experience of comfort will improve Outcome: Progressing   Problem: Safety: Goal: Ability to remain free from injury will improve Outcome: Progressing

## 2021-07-30 LAB — BASIC METABOLIC PANEL
Anion gap: 5 (ref 5–15)
BUN: 30 mg/dL — ABNORMAL HIGH (ref 8–23)
CO2: 22 mmol/L (ref 22–32)
Calcium: 8.6 mg/dL — ABNORMAL LOW (ref 8.9–10.3)
Chloride: 108 mmol/L (ref 98–111)
Creatinine, Ser: 0.86 mg/dL (ref 0.44–1.00)
GFR, Estimated: >60 mL/min (ref >60)
Glucose, Bld: 180 mg/dL — ABNORMAL HIGH (ref 70–99)
Potassium: 4.4 mmol/L (ref 3.5–5.1)
Sodium: 135 mmol/L (ref 135–145)

## 2021-07-30 LAB — GLUCOSE, CAPILLARY
Glucose-Capillary: 192 mg/dL — ABNORMAL HIGH (ref 70–99)
Glucose-Capillary: 269 mg/dL — ABNORMAL HIGH (ref 70–99)
Glucose-Capillary: 182 mg/dL — ABNORMAL HIGH (ref 70–99)
Glucose-Capillary: 151 mg/dL — ABNORMAL HIGH (ref 70–99)

## 2021-07-30 LAB — CBC WITH DIFFERENTIAL/PLATELET
Abs Immature Granulocytes: 0.03 10*3/uL (ref 0.00–0.07)
Basophils Absolute: 0 10*3/uL (ref 0.0–0.1)
Basophils Relative: 1 %
Eosinophils Absolute: 0.5 10*3/uL (ref 0.0–0.5)
Eosinophils Relative: 7 %
HCT: 42.4 % (ref 36.0–46.0)
Hemoglobin: 13.8 g/dL (ref 12.0–15.0)
Immature Granulocytes: 0 %
Lymphocytes Relative: 21 %
Lymphs Abs: 1.5 10*3/uL (ref 0.7–4.0)
MCH: 29.4 pg (ref 26.0–34.0)
MCHC: 32.5 g/dL (ref 30.0–36.0)
MCV: 90.4 fL (ref 80.0–100.0)
Monocytes Absolute: 0.6 10*3/uL (ref 0.1–1.0)
Monocytes Relative: 9 %
Neutro Abs: 4.5 10*3/uL (ref 1.7–7.7)
Neutrophils Relative %: 62 %
Platelets: 229 10*3/uL (ref 150–400)
RBC: 4.69 MIL/uL (ref 3.87–5.11)
RDW: 13.4 % (ref 11.5–15.5)
WBC: 7.1 10*3/uL (ref 4.0–10.5)
nRBC: 0 % (ref 0.0–0.2)

## 2021-07-30 LAB — URINE CULTURE: Culture: >=100000 — AB

## 2021-07-30 MED ORDER — SODIUM CHLORIDE 0.9% FLUSH: 10.0000 mL | INTRAVENOUS | Status: DC | PRN

## 2021-07-30 MED ORDER — INSULIN GLARGINE-YFGN 100 UNIT/ML ~~LOC~~ SOLN
32.0000 [IU] | Freq: Every day | SUBCUTANEOUS | Status: DC
  Administered 2021-07-31 – 2021-08-02 (×3): 32 [IU] via SUBCUTANEOUS
  Filled 2021-07-30 (×3): qty 0.32

## 2021-07-30 MED ORDER — HYDRALAZINE HCL 20 MG/ML IJ SOLN
10.0000 mg | Freq: Four times a day (QID) | INTRAMUSCULAR | Status: DC | PRN
  Administered 2021-07-30 – 2021-07-31 (×2): 10 mg via INTRAVENOUS
  Filled 2021-07-30 (×2): qty 1

## 2021-07-30 MED ORDER — SODIUM CHLORIDE 0.9 % IV SOLN: INTRAVENOUS | Status: DC | PRN

## 2021-07-30 NOTE — Progress Notes (Signed)
PROGRESS NOTE    Judith Shaw  OZD:[id number] DOB: [birth year] DOA: 07/27/2021 PCP: Pcp, No    Brief Narrative:  Judith Shaw is a 70 y.o. female with medical history significant for obstructive sleep apnea, obesity, anxiety, hypertension, history of fatty liver, hyperlipidemia, history of uncontrolled insulin-dependent diabetes mellitus type 2, who presents emergency department for chief concerns of altered mental status. Patient diagnosed with UTI and severe sepsis.  Given fluids and antibiotics.  Blood culture negative, urine culture grows Pseudomonas aeruginosa.  Continue cefepime.  Due to Cipro at time of discharge.   Assessment & Plan:   Principal Problem:   Severe sepsis with acute organ dysfunction (HCC) Active Problems:   Sepsis ([place])   UTI (urinary tract infection)   Sleep apnea   Type 2 diabetes mellitus with diabetic nephropathy, with long-term current use of insulin (HCC)   Acute metabolic encephalopathy   Acute pyelonephritis   Severe sepsis (HCC)  Severe sepsis secondary to pyelonephritis due to Pseudomonas Acute pyelonephritis due to Pseudomonas  Acute metabolic encephalopathy secondary to pyelonephritis. Patient condition continued to improve.  Culture finalized, Pseudomonas susceptible to cefepime and Cipro.  Planning to continue cefepime, changed to oral Cipro at time of discharge.  Total course of 10 days for pyelonephritis.  Acute kidney injury secondary to severe sepsis Metabolic acidosis secondary to renal failure. Hyperkalemia. Conditions are improved.  Uncontrolled type 2 diabetes with hyperglycemia. Glucose running higher due to better appetite.  Increase Lantus dose.  Morbid obesity. Obstructive sleep apnea. Continue CPAP while asleep.  Chronic dementia. Continue home medicines   DVT prophylaxis: Lovenox Code Status: full Family Communication:  Disposition Plan:      Status is: Inpatient   Remains inpatient appropriate because: severity  of disease, iv antibiotics      I/O last 3 completed shifts: In: 2619.7 [I.V.:2321.3; IV Piggyback:298.4] Out: 1300 [Urine:1300] Total I/O In: 240 [P.O.:240] Out: -       Subjective: Condition much improved.  She no longer has any confusion. Due to significant fatigue, weakness. Denies any abdominal pain nausea vomiting. No dysuria hematuria  No fever or chills.   Objective: Vitals:   07/30/21 0515 07/30/21 0612 07/30/21 0840 07/30/21 1141  BP: (!) 194/77 (!) 164/66 (!) 171/65 (!) 127/53  Pulse: 64 66 74 75  Resp: 18  20 18   Temp:   98.3 F (36.8 C) 98 F (36.7 C)  TempSrc:      SpO2: 94%  91% 97%  Weight:      Height:        Intake/Output Summary (Last 24 hours) at 07/30/2021 1249 Last data filed at 07/30/2021 1000 Gross per 24 hour  Intake 1661.89 ml  Output 600 ml  Net 1061.89 ml   Filed Weights   07/27/21 1158  Weight: 114 kg    Examination:  General exam: Appears calm and comfortable  Respiratory system: Clear to auscultation. Respiratory effort normal. Cardiovascular system: S1 & S2 heard, RRR. No JVD, murmurs, rubs, gallops or clicks. No pedal edema. Gastrointestinal system: Abdomen is nondistended, soft and nontender. No organomegaly or masses felt. Normal bowel sounds heard. Central nervous system: Alert and oriented x3. No focal neurological deficits. Extremities: Symmetric 5 x 5 power. Skin: No rashes, lesions or ulcers Psychiatry: Judgement and insight appear normal. Mood & affect appropriate.     Data Reviewed: I have personally reviewed following labs and imaging studies  CBC: Recent Labs  Lab 07/27/21 1159 07/28/21 0644 07/29/21 0238 07/30/21 0421  WBC 9.7  8.8 7.5 7.1  NEUTROABS 5.5 5.8 5.1 4.5  HGB 16.1* 15.0 14.5 13.8  HCT 49.1* 45.6 45.4 42.4  MCV 90.3 91.8 90.6 90.4  PLT 298 252 249 213   Basic Metabolic Panel: Recent Labs  Lab 07/27/21 1159 07/28/21 0644 07/29/21 0238 07/30/21 0421  NA 135 136 136 135  K 4.6 5.2*  5.2* 4.4  CL 104 109 107 108  CO2 26 21* 23 22  GLUCOSE 126* 168* 183* 180*  BUN 31* 35* 30* 30*  CREATININE 1.32* 1.19* 1.06* 0.86  CALCIUM 9.4 8.7* 8.9 8.6*  MG  --  1.9 1.7  --   PHOS  --  4.3  --   --    GFR: Estimated Creatinine Clearance: 75.6 mL/min (by C-G formula based on SCr of 0.86 mg/dL). Liver Function Tests: Recent Labs  Lab 07/27/21 1159  AST 42*  ALT 33  ALKPHOS 89  BILITOT 0.5  PROT 8.0  ALBUMIN 3.4*   No results for input(s): LIPASE, AMYLASE in the last 168 hours. No results for input(s): AMMONIA in the last 168 hours. Coagulation Profile: Recent Labs  Lab 07/27/21 1159 07/28/21 0644  INR 1.0 1.1   Cardiac Enzymes: No results for input(s): CKTOTAL, CKMB, CKMBINDEX, TROPONINI in the last 168 hours. BNP (last 3 results) No results for input(s): PROBNP in the last 8760 hours. HbA1C: Recent Labs    07/29/21 1154  HGBA1C 8.5*   CBG: Recent Labs  Lab 07/29/21 1204 07/29/21 1755 07/29/21 2130 07/30/21 0835 07/30/21 1143  GLUCAP 213* 172* 182* 192* 269*   Lipid Profile: No results for input(s): CHOL, HDL, LDLCALC, TRIG, CHOLHDL, LDLDIRECT in the last 72 hours. Thyroid Function Tests: Recent Labs    07/27/21 1620  FREET4 0.90   Anemia Panel: No results for input(s): VITAMINB12, FOLATE, FERRITIN, TIBC, IRON, RETICCTPCT in the last 72 hours. Sepsis Labs: Recent Labs  Lab 07/27/21 1620 07/28/21 0131 07/28/21 0140  PROCALCITON  --  <0.10  --   LATICACIDVEN 3.4*  --  1.4    Recent Results (from the past 240 hour(s))  Resp Panel by RT-PCR (Flu A&B, Covid) Nasopharyngeal Swab     Status: None   Collection Time: 07/27/21  4:20 PM   Specimen: Nasopharyngeal Swab; Nasopharyngeal(NP) swabs in vial transport medium  Result Value Ref Range Status   SARS Coronavirus 2 by RT PCR NEGATIVE NEGATIVE Final    Comment: (NOTE) SARS-CoV-2 target nucleic acids are NOT DETECTED.  The SARS-CoV-2 RNA is generally detectable in upper  respiratory specimens during the acute phase of infection. The lowest concentration of SARS-CoV-2 viral copies this assay can detect is 138 copies/mL. A negative result does not preclude SARS-Cov-2 infection and should not be used as the sole basis for treatment or other patient management decisions. A negative result may occur with  improper specimen collection/handling, submission of specimen other than nasopharyngeal swab, presence of viral mutation(s) within the areas targeted by this assay, and inadequate number of viral copies(<138 copies/mL). A negative result must be combined with clinical observations, patient history, and epidemiological information. The expected result is Negative.  Fact Sheet for Patients:  EntrepreneurPulse.com.au  Fact Sheet for Healthcare Providers:  IncredibleEmployment.be  This test is no t yet approved or cleared by the [place] FDA and  has been authorized for detection and/or diagnosis of SARS-CoV-2 by FDA under an Emergency Use Authorization (EUA). This EUA will remain  in effect (meaning this test can be used) for the duration of the COVID-19 declaration  under Section 564(b)(1) of the Act, 21 U.S.C.section 360bbb-3(b)(1), unless the authorization is terminated  or revoked sooner.       Influenza A by PCR NEGATIVE NEGATIVE Final   Influenza B by PCR NEGATIVE NEGATIVE Final    Comment: (NOTE) The Xpert Xpress SARS-CoV-2/FLU/RSV plus assay is intended as an aid in the diagnosis of influenza from Nasopharyngeal swab specimens and should not be used as a sole basis for treatment. Nasal washings and aspirates are unacceptable for Xpert Xpress SARS-CoV-2/FLU/RSV testing.  Fact Sheet for Patients: EntrepreneurPulse.com.au  Fact Sheet for Healthcare Providers: IncredibleEmployment.be  This test is not yet approved or cleared by the [place] FDA and has been  authorized for detection and/or diagnosis of SARS-CoV-2 by FDA under an Emergency Use Authorization (EUA). This EUA will remain in effect (meaning this test can be used) for the duration of the COVID-19 declaration under Section 564(b)(1) of the Act, 21 U.S.C. section 360bbb-3(b)(1), unless the authorization is terminated or revoked.  Performed at [facility], [address] [place], [place] [address]   Blood Culture (routine x 2)     Status: None (Preliminary result)   Collection Time: 07/27/21  4:20 PM   Specimen: BLOOD  Result Value Ref Range Status   Specimen Description BLOOD LEFT ANTECUBITAL  Final   Special Requests   Final    BOTTLES DRAWN AEROBIC AND ANAEROBIC Blood Culture adequate volume   Culture   Final    NO GROWTH 3 DAYS Performed at [facility], [address], [place], [place] [address]    Report Status PENDING  Incomplete  Blood Culture (routine x 2)     Status: None (Preliminary result)   Collection Time: 07/27/21  4:39 PM   Specimen: BLOOD  Result Value Ref Range Status   Specimen Description BLOOD BLOOD RIGHT FOREARM  Final   Special Requests   Final    BOTTLES DRAWN AEROBIC AND ANAEROBIC Blood Culture results may not be optimal due to an inadequate volume of blood received in culture bottles   Culture   Final    NO GROWTH 3 DAYS Performed at [facility], [address], [place], [place] [address]    Report Status PENDING  Incomplete  Urine Culture     Status: Abnormal   Collection Time: 07/27/21  5:13 PM   Specimen: Urine, Catheterized  Result Value Ref Range Status   Specimen Description   Final    URINE, CATHETERIZED Performed at [facility], [address] [place], [place] [address]    Special Requests   Final    NONE Performed at [facility], [address] [place], [place] [address]    Culture >=100,000 COLONIES/mL PSEUDOMONAS AERUGINOSA (A)  Final   Report Status 07/30/2021 FINAL   Final   Organism ID, Bacteria PSEUDOMONAS AERUGINOSA (A)  Final      Susceptibility   Pseudomonas aeruginosa - MIC*    CEFTAZIDIME 4 SENSITIVE Sensitive     CIPROFLOXACIN <=0.25 SENSITIVE Sensitive     GENTAMICIN <=1 SENSITIVE Sensitive     IMIPENEM 1 SENSITIVE Sensitive     PIP/TAZO 8 SENSITIVE Sensitive     CEFEPIME 2 SENSITIVE Sensitive     * >=100,000 COLONIES/mL PSEUDOMONAS AERUGINOSA         Radiology Studies: No results found.      Scheduled Meds:  aspirin EC  81 mg Oral Daily   donepezil  10 mg Oral QHS   DULoxetine  60 mg  Oral Daily   enoxaparin (LOVENOX) injection  0.5 mg/kg Subcutaneous Q24H   insulin aspart  0-5 Units Subcutaneous QHS   insulin aspart  0-9 Units Subcutaneous TID WC   insulin glargine-yfgn  22 Units Subcutaneous Daily   mouth rinse  15 mL Mouth Rinse BID   nystatin   Topical BID   QUEtiapine  25 mg Oral QHS   simvastatin  40 mg Oral q morning   Continuous Infusions:  ceFEPime (MAXIPIME) IV 2 g (07/30/21 1239)     LOS: 2 days    Time spent: 28 minutes    Sharen Hones, MD [facility]   To contact the attending provider between 7A-7P or the covering provider during after hours 7P-7A, please log into the web site [email or web address] and access using universal [place] password for that web site. If you do not have the password, please call the hospital operator.  07/30/2021, 12:49 PM

## 2021-07-30 NOTE — Progress Notes (Signed)
Patient was being assisted from chair to bed. Patient was unable to assist supporting herself halfway through the transfer. Nursing staff was required to safely assist her to the ground. Staff assisted patient from ground to bed without issue. Patient denies any pain or injuries. Vital signs are stable. MD was notified. No further orders were placed. Patient reports being comfortable in bed and appreciated the assistance.

## 2021-07-31 ENCOUNTER — Inpatient Hospital Stay: Payer: Medicare PPO

## 2021-07-31 LAB — GLUCOSE, CAPILLARY
Glucose-Capillary: 195 mg/dL — ABNORMAL HIGH (ref 70–99)
Glucose-Capillary: 212 mg/dL — ABNORMAL HIGH (ref 70–99)
Glucose-Capillary: 225 mg/dL — ABNORMAL HIGH (ref 70–99)
Glucose-Capillary: 195 mg/dL — ABNORMAL HIGH (ref 70–99)
Glucose-Capillary: 150 mg/dL — ABNORMAL HIGH (ref 70–99)

## 2021-07-31 MED ORDER — POLYVINYL ALCOHOL 1.4 % OP SOLN
2.0000 [drp] | Freq: Four times a day (QID) | OPHTHALMIC | Status: DC | PRN
  Administered 2021-07-31: 22:00:00 2 [drp] via OPHTHALMIC
  Filled 2021-07-31: qty 15

## 2021-07-31 NOTE — TOC Progression Note (Signed)
Transition of Care [facility]) - Progression Note    Patient Details  Name: Judith Shaw MRN: [id number] Date of Birth: 03/26/1951  Transition of Care [facility]) CM/SW [name], RN Phone Number: 07/31/2021, 1:02 PM  Clinical Narrative:   Patient has no bed offers today, bed search re-sent in hub.  TOC to follow         Expected Discharge Plan and Services                                                 Social Determinants of Health (SDOH) Interventions    Readmission Risk Interventions No flowsheet data found.

## 2021-07-31 NOTE — Progress Notes (Signed)
PROGRESS NOTE    Judith Shaw  ERX:[id number] DOB: 1951-07-09 DOA: 07/27/2021 PCP: Pcp, No    Brief Narrative:   Judith Shaw is a 70 y.o. female with medical history significant for obstructive sleep apnea, obesity, anxiety, hypertension, history of fatty liver, hyperlipidemia, history of uncontrolled insulin-dependent diabetes mellitus type 2, who presents emergency department for chief concerns of altered mental status. Patient diagnosed with UTI and severe sepsis.  Given fluids and antibiotics.  Blood culture negative, urine culture grows Pseudomonas aeruginosa.  Continue cefepime.  Change to Cipro at time of discharge.  Assessment & Plan:   Principal Problem:   Severe sepsis with acute organ dysfunction (HCC) Active Problems:   Sepsis ([place])   UTI (urinary tract infection)   Sleep apnea   Type 2 diabetes mellitus with diabetic nephropathy, with long-term current use of insulin (HCC)   Acute metabolic encephalopathy   Acute pyelonephritis   Severe sepsis (HCC)  Severe sepsis secondary to pyelonephritis due to Pseudomonas Acute pyelonephritis due to Pseudomonas  Acute metabolic encephalopathy secondary to pyelonephritis. Condition had improved, continue cefepime.   Acute kidney injury secondary to severe sepsis Metabolic acidosis secondary to renal failure. Hyperkalemia. Renal function has normalized.   Uncontrolled type 2 diabetes with hyperglycemia. Continue current regimen   Morbid obesity. Obstructive sleep apnea. Patient is not able to tolerate the CPAP.  She refused to wear it again.  She was drowsy this morning, but with day goes on, she is more awake.  Chronic dementia. Continue home medicines   Possible peripheral arterial disease.   Patient bilateral foot appear purple, will obtain duplex arterial ultrasound.    DVT prophylaxis: Lovenox Code Status: full Family Communication:  Disposition Plan:      Status is: Inpatient   Remains inpatient  appropriate because: severity of disease, iv antibiotics        I/O last 3 completed shifts: In: 2349.2 [P.O.:480; I.V.:1369.2; IV PJKDTOIZT:245] Out: 1300 [Urine:1300] Total I/O In: 240 [P.O.:240] Out: 350 [Urine:350]     Subjective: Patient was very sleepy this morning, but no confusion.  She refused to wear CPAP.  She woke up more during the day. She denies any short of breath or cough. No abdominal pain or nausea vomiting. No fever or chills.  Objective: Vitals:   07/31/21 0507 07/31/21 0623 07/31/21 0716 07/31/21 1301  BP: (!) 176/75 (!) 188/80 (!) 142/56 (!) 109/57  Pulse: 66 75 71 79  Resp: 20  15 17   Temp: 97.6 F (36.4 C)  97.8 F (36.6 C) (!) 97.4 F (36.3 C)  TempSrc:      SpO2: 92%  97% 98%  Weight:      Height:        Intake/Output Summary (Last 24 hours) at 07/31/2021 1333 Last data filed at 07/31/2021 1018 Gross per 24 hour  Intake 1081.26 ml  Output 1050 ml  Net 31.26 ml   Filed Weights   07/27/21 1158  Weight: 114 kg    Examination:  General exam: Appears calm and comfortable  Respiratory system: Clear to auscultation. Respiratory effort normal. Cardiovascular system: S1 & S2 heard, RRR. No JVD, murmurs, rubs, gallops or clicks. No pedal edema. Gastrointestinal system: Abdomen is nondistended, soft and nontender. No organomegaly or masses felt. Normal bowel sounds heard. Central nervous system: Alert and oriented. No focal neurological deficits. Extremities:Bilateral foot purple in color, mild swelling. Skin: No rashes, lesions or ulcers Psychiatry: Mood & affect appropriate.     Data Reviewed: I have personally  reviewed following labs and imaging studies  CBC: Recent Labs  Lab 07/27/21 1159 07/28/21 0644 07/29/21 0238 07/30/21 0421  WBC 9.7 8.8 7.5 7.1  NEUTROABS 5.5 5.8 5.1 4.5  HGB 16.1* 15.0 14.5 13.8  HCT 49.1* 45.6 45.4 42.4  MCV 90.3 91.8 90.6 90.4  PLT 298 252 249 767   Basic Metabolic Panel: Recent Labs  Lab  07/27/21 1159 07/28/21 0644 07/29/21 0238 07/30/21 0421  NA 135 136 136 135  K 4.6 5.2* 5.2* 4.4  CL 104 109 107 108  CO2 26 21* 23 22  GLUCOSE 126* 168* 183* 180*  BUN 31* 35* 30* 30*  CREATININE 1.32* 1.19* 1.06* 0.86  CALCIUM 9.4 8.7* 8.9 8.6*  MG  --  1.9 1.7  --   PHOS  --  4.3  --   --    GFR: Estimated Creatinine Clearance: 75.6 mL/min (by C-G formula based on SCr of 0.86 mg/dL). Liver Function Tests: Recent Labs  Lab 07/27/21 1159  AST 42*  ALT 33  ALKPHOS 89  BILITOT 0.5  PROT 8.0  ALBUMIN 3.4*   No results for input(s): LIPASE, AMYLASE in the last 168 hours. No results for input(s): AMMONIA in the last 168 hours. Coagulation Profile: Recent Labs  Lab 07/27/21 1159 07/28/21 0644  INR 1.0 1.1   Cardiac Enzymes: No results for input(s): CKTOTAL, CKMB, CKMBINDEX, TROPONINI in the last 168 hours. BNP (last 3 results) No results for input(s): PROBNP in the last 8760 hours. HbA1C: Recent Labs    07/29/21 1154  HGBA1C 8.5*   CBG: Recent Labs  Lab 07/30/21 1622 07/30/21 2135 07/31/21 0730 07/31/21 1223 07/31/21 1304  GLUCAP 182* 151* 195* 212* 225*   Lipid Profile: No results for input(s): CHOL, HDL, LDLCALC, TRIG, CHOLHDL, LDLDIRECT in the last 72 hours. Thyroid Function Tests: No results for input(s): TSH, T4TOTAL, FREET4, T3FREE, THYROIDAB in the last 72 hours. Anemia Panel: No results for input(s): VITAMINB12, FOLATE, FERRITIN, TIBC, IRON, RETICCTPCT in the last 72 hours. Sepsis Labs: Recent Labs  Lab 07/27/21 1620 07/28/21 0131 07/28/21 0140  PROCALCITON  --  <0.10  --   LATICACIDVEN 3.4*  --  1.4    Recent Results (from the past 240 hour(s))  Resp Panel by RT-PCR (Flu A&B, Covid) Nasopharyngeal Swab     Status: None   Collection Time: 07/27/21  4:20 PM   Specimen: Nasopharyngeal Swab; Nasopharyngeal(NP) swabs in vial transport medium  Result Value Ref Range Status   SARS Coronavirus 2 by RT PCR NEGATIVE NEGATIVE Final    Comment:  (NOTE) SARS-CoV-2 target nucleic acids are NOT DETECTED.  The SARS-CoV-2 RNA is generally detectable in upper respiratory specimens during the acute phase of infection. The lowest concentration of SARS-CoV-2 viral copies this assay can detect is 138 copies/mL. A negative result does not preclude SARS-Cov-2 infection and should not be used as the sole basis for treatment or other patient management decisions. A negative result may occur with  improper specimen collection/handling, submission of specimen other than nasopharyngeal swab, presence of viral mutation(s) within the areas targeted by this assay, and inadequate number of viral copies(<138 copies/mL). A negative result must be combined with clinical observations, patient history, and epidemiological information. The expected result is Negative.  Fact Sheet for Patients:  EntrepreneurPulse.com.au  Fact Sheet for Healthcare Providers:  IncredibleEmployment.be  This test is no t yet approved or cleared by the [place] FDA and  has been authorized for detection and/or diagnosis of SARS-CoV-2 by FDA under  an Emergency Use Authorization (EUA). This EUA will remain  in effect (meaning this test can be used) for the duration of the COVID-19 declaration under Section 564(b)(1) of the Act, 21 U.S.C.section 360bbb-3(b)(1), unless the authorization is terminated  or revoked sooner.       Influenza A by PCR NEGATIVE NEGATIVE Final   Influenza B by PCR NEGATIVE NEGATIVE Final    Comment: (NOTE) The Xpert Xpress SARS-CoV-2/FLU/RSV plus assay is intended as an aid in the diagnosis of influenza from Nasopharyngeal swab specimens and should not be used as a sole basis for treatment. Nasal washings and aspirates are unacceptable for Xpert Xpress SARS-CoV-2/FLU/RSV testing.  Fact Sheet for Patients: EntrepreneurPulse.com.au  Fact Sheet for Healthcare  Providers: IncredibleEmployment.be  This test is not yet approved or cleared by the [place] FDA and has been authorized for detection and/or diagnosis of SARS-CoV-2 by FDA under an Emergency Use Authorization (EUA). This EUA will remain in effect (meaning this test can be used) for the duration of the COVID-19 declaration under Section 564(b)(1) of the Act, 21 U.S.C. section 360bbb-3(b)(1), unless the authorization is terminated or revoked.  Performed at [facility], [address] [place], [place] [address]   Blood Culture (routine x 2)     Status: None (Preliminary result)   Collection Time: 07/27/21  4:20 PM   Specimen: BLOOD  Result Value Ref Range Status   Specimen Description BLOOD LEFT ANTECUBITAL  Final   Special Requests   Final    BOTTLES DRAWN AEROBIC AND ANAEROBIC Blood Culture adequate volume   Culture   Final    NO GROWTH 4 DAYS Performed at [facility], [address], [place], [place] [address]    Report Status PENDING  Incomplete  Blood Culture (routine x 2)     Status: None (Preliminary result)   Collection Time: 07/27/21  4:39 PM   Specimen: BLOOD  Result Value Ref Range Status   Specimen Description BLOOD BLOOD RIGHT FOREARM  Final   Special Requests   Final    BOTTLES DRAWN AEROBIC AND ANAEROBIC Blood Culture results may not be optimal due to an inadequate volume of blood received in culture bottles   Culture   Final    NO GROWTH 4 DAYS Performed at [facility], [address] [place], [place] [address]    Report Status PENDING  Incomplete  Urine Culture     Status: Abnormal   Collection Time: 07/27/21  5:13 PM   Specimen: Urine, Catheterized  Result Value Ref Range Status   Specimen Description   Final    URINE, CATHETERIZED Performed at [facility], [address] [place], [place] [address]    Special Requests   Final    NONE Performed at [facility], [address] [place], [place] [address]    Culture >=100,000 COLONIES/mL PSEUDOMONAS AERUGINOSA (A)  Final   Report Status 07/30/2021 FINAL  Final   Organism ID, Bacteria PSEUDOMONAS AERUGINOSA (A)  Final      Susceptibility   Pseudomonas aeruginosa - MIC*    CEFTAZIDIME 4 SENSITIVE Sensitive     CIPROFLOXACIN <=0.25 SENSITIVE Sensitive     GENTAMICIN <=1 SENSITIVE Sensitive     IMIPENEM 1 SENSITIVE Sensitive     PIP/TAZO 8 SENSITIVE Sensitive     CEFEPIME 2 SENSITIVE Sensitive     * >=100,000 COLONIES/mL PSEUDOMONAS AERUGINOSA         Radiology Studies: No results found.  Scheduled Meds:  aspirin EC  81 mg Oral Daily   donepezil  10 mg Oral QHS   DULoxetine  60 mg Oral Daily   enoxaparin (LOVENOX) injection  0.5 mg/kg Subcutaneous Q24H   insulin aspart  0-5 Units Subcutaneous QHS   insulin aspart  0-9 Units Subcutaneous TID WC   insulin glargine-yfgn  32 Units Subcutaneous Daily   mouth rinse  15 mL Mouth Rinse BID   nystatin   Topical BID   QUEtiapine  25 mg Oral QHS   simvastatin  40 mg Oral q morning   Continuous Infusions:  sodium chloride Stopped (07/31/21 0620)   ceFEPime (MAXIPIME) IV 2 g (07/31/21 1331)     LOS: 3 days    Time spent: 28 minutes    Sharen Hones, MD [facility]   To contact the attending provider between 7A-7P or the covering provider during after hours 7P-7A, please log into the web site [email or web address] and access using universal [place] password for that web site. If you do not have the password, please call the hospital operator.  07/31/2021, 1:33 PM

## 2021-08-01 ENCOUNTER — Inpatient Hospital Stay: Payer: Medicare PPO

## 2021-08-01 LAB — GLUCOSE, CAPILLARY
Glucose-Capillary: 156 mg/dL — ABNORMAL HIGH (ref 70–99)
Glucose-Capillary: 215 mg/dL — ABNORMAL HIGH (ref 70–99)
Glucose-Capillary: 142 mg/dL — ABNORMAL HIGH (ref 70–99)
Glucose-Capillary: 158 mg/dL — ABNORMAL HIGH (ref 70–99)

## 2021-08-01 LAB — CULTURE, BLOOD (ROUTINE X 2)
Culture: NO GROWTH
Culture: NO GROWTH
Special Requests: ADEQUATE

## 2021-08-01 LAB — RESP PANEL BY RT-PCR (FLU A&B, COVID) ARPGX2
Influenza A by PCR: NEGATIVE
Influenza B by PCR: NEGATIVE
SARS Coronavirus 2 by RT PCR: NEGATIVE

## 2021-08-01 MED ORDER — OXYCODONE-ACETAMINOPHEN 5-325 MG PO TABS: 0.5000 | ORAL_TABLET | ORAL | Status: DC | PRN

## 2021-08-01 NOTE — TOC Progression Note (Signed)
Transition of Care [facility]) - Progression Note    Patient Details  Name: Judith Shaw MRN: [id number] Date of Birth: 06-19-1951  Transition [facility]) CM/SW [place], RN Phone Number: 08/01/2021, 12:30 PM  Clinical Narrative:    Spoke with the patient and her caregiver, called her sister [name] and  reviewed the bed offers, [name] is a Designer, jewellery, [name] and the patient have agreed to go to [facility], I notified [facility], [employer] insurance process started        Expected Discharge Plan and Services                                                 Social Determinants of Health (SDOH) Interventions    Readmission Risk Interventions No flowsheet data found.

## 2021-08-01 NOTE — Progress Notes (Signed)
PROGRESS NOTE    Judith Shaw  PYP:[id number] DOB: [birth year] DOA: 07/27/2021 PCP: Pcp, No   Chief complaint.  Altered mental status. Brief Narrative:  Judith Shaw is a 70 y.o. female with medical history significant for obstructive sleep apnea, obesity, anxiety, hypertension, history of fatty liver, hyperlipidemia, history of uncontrolled insulin-dependent diabetes mellitus type 2, who presents emergency department for chief concerns of altered mental status. Patient diagnosed with UTI and severe sepsis.  Given fluids and antibiotics.  Blood culture negative, urine culture grows Pseudomonas aeruginosa.  Continue cefepime.  Change to Cipro at time of discharge.   Assessment & Plan:   Principal Problem:   Severe sepsis with acute organ dysfunction (HCC) Active Problems:   Sepsis ([place])   UTI (urinary tract infection)   Sleep apnea   Type 2 diabetes mellitus with diabetic nephropathy, with long-term current use of insulin (HCC)   Acute metabolic encephalopathy   Acute pyelonephritis   Severe sepsis (HCC)  Severe sepsis secondary to pyelonephritis due to Pseudomonas Acute pyelonephritis due to Pseudomonas  Acute metabolic encephalopathy secondary to pyelonephritis. Patient has improved.  Currently on cefepime.  Will change to Cipro at time of discharge, total course of antibiotics 14 days.   Acute kidney injury secondary to severe sepsis Metabolic acidosis secondary to renal failure. Hyperkalemia. Conditions are improved.  Morbid obesity. Obstructive sleep apnea. Not tolerating CPAP.  Uncontrolled type 2 diabetes with hyperglycemia No change in regimen today.   Chronic dementia. Continue home medicines   Possible peripheral arterial disease.   Patient bilateral foot appear purple, pending duplex arterial ultrasound.   DVT prophylaxis: Lovenox Code Status: full Family Communication: Talked with Sister, Billyyesterday. Disposition Plan:      Status is: Inpatient    Remains inpatient appropriate because: severity of disease, iv antibiotics          I/O last 3 completed shifts: In: 1048.5 [P.O.:720; I.V.:28.5; IV Piggyback:300] Out: 1050 [Urine:1050] No intake/output data recorded.     Subjective: Patient slept well last night, she is more awake today.  She appeared to have some short-term memory issues. Denies any short of breath or cough. No abdominal pain nausea vomiting or diarrhea No dysuria hematuria.  Objective: Vitals:   07/31/21 1634 07/31/21 2023 08/01/21 0546 08/01/21 0739  BP: 137/64 (!) 169/79 (!) 176/87 (!) 158/63  Pulse: 71 68 76 79  Resp: 18 18 19 17   Temp: 97.8 F (36.6 C) (!) 97.5 F (36.4 C) (!) 97.5 F (36.4 C) 98 F (36.7 C)  TempSrc:  Oral Oral   SpO2: 92% 95% 92% 93%  Weight:      Height:        Intake/Output Summary (Last 24 hours) at 08/01/2021 1019 Last data filed at 08/01/2021 0100 Gross per 24 hour  Intake 580 ml  Output --  Net 580 ml   Filed Weights   07/27/21 1158  Weight: 114 kg    Examination:  General exam: Appears calm and comfortable  Respiratory system: Clear to auscultation. Respiratory effort normal. Cardiovascular system: S1 & S2 heard, RRR. No JVD, murmurs, rubs, gallops or clicks. No pedal edema. Gastrointestinal system: Abdomen is nondistended, soft and nontender. No organomegaly or masses felt. Normal bowel sounds heard. Central nervous system: Alert and oriented x3. No focal neurological deficits. Extremities: Both foot swelling Skin: No rashes, lesions or ulcers Psychiatry:  Mood & affect appropriate.     Data Reviewed: I have personally reviewed following labs and imaging studies  CBC: Recent Labs  Lab 07/27/21 1159 07/28/21 0644 07/29/21 0238 07/30/21 0421  WBC 9.7 8.8 7.5 7.1  NEUTROABS 5.5 5.8 5.1 4.5  HGB 16.1* 15.0 14.5 13.8  HCT 49.1* 45.6 45.4 42.4  MCV 90.3 91.8 90.6 90.4  PLT 298 252 249 010   Basic Metabolic Panel: Recent Labs  Lab  07/27/21 1159 07/28/21 0644 07/29/21 0238 07/30/21 0421  NA 135 136 136 135  K 4.6 5.2* 5.2* 4.4  CL 104 109 107 108  CO2 26 21* 23 22  GLUCOSE 126* 168* 183* 180*  BUN 31* 35* 30* 30*  CREATININE 1.32* 1.19* 1.06* 0.86  CALCIUM 9.4 8.7* 8.9 8.6*  MG  --  1.9 1.7  --   PHOS  --  4.3  --   --    GFR: Estimated Creatinine Clearance: 75.6 mL/min (by C-G formula based on SCr of 0.86 mg/dL). Liver Function Tests: Recent Labs  Lab 07/27/21 1159  AST 42*  ALT 33  ALKPHOS 89  BILITOT 0.5  PROT 8.0  ALBUMIN 3.4*   No results for input(s): LIPASE, AMYLASE in the last 168 hours. No results for input(s): AMMONIA in the last 168 hours. Coagulation Profile: Recent Labs  Lab 07/27/21 1159 07/28/21 0644  INR 1.0 1.1   Cardiac Enzymes: No results for input(s): CKTOTAL, CKMB, CKMBINDEX, TROPONINI in the last 168 hours. BNP (last 3 results) No results for input(s): PROBNP in the last 8760 hours. HbA1C: Recent Labs    07/29/21 1154  HGBA1C 8.5*   CBG: Recent Labs  Lab 07/31/21 1223 07/31/21 1304 07/31/21 1636 07/31/21 2101 08/01/21 0741  GLUCAP 212* 225* 195* 150* 156*   Lipid Profile: No results for input(s): CHOL, HDL, LDLCALC, TRIG, CHOLHDL, LDLDIRECT in the last 72 hours. Thyroid Function Tests: No results for input(s): TSH, T4TOTAL, FREET4, T3FREE, THYROIDAB in the last 72 hours. Anemia Panel: No results for input(s): VITAMINB12, FOLATE, FERRITIN, TIBC, IRON, RETICCTPCT in the last 72 hours. Sepsis Labs: Recent Labs  Lab 07/27/21 1620 07/28/21 0131 07/28/21 0140  PROCALCITON  --  <0.10  --   LATICACIDVEN 3.4*  --  1.4    Recent Results (from the past 240 hour(s))  Resp Panel by RT-PCR (Flu A&B, Covid) Nasopharyngeal Swab     Status: None   Collection Time: 07/27/21  4:20 PM   Specimen: Nasopharyngeal Swab; Nasopharyngeal(NP) swabs in vial transport medium  Result Value Ref Range Status   SARS Coronavirus 2 by RT PCR NEGATIVE NEGATIVE Final    Comment:  (NOTE) SARS-CoV-2 target nucleic acids are NOT DETECTED.  The SARS-CoV-2 RNA is generally detectable in upper respiratory specimens during the acute phase of infection. The lowest concentration of SARS-CoV-2 viral copies this assay can detect is 138 copies/mL. A negative result does not preclude SARS-Cov-2 infection and should not be used as the sole basis for treatment or other patient management decisions. A negative result may occur with  improper specimen collection/handling, submission of specimen other than nasopharyngeal swab, presence of viral mutation(s) within the areas targeted by this assay, and inadequate number of viral copies(<138 copies/mL). A negative result must be combined with clinical observations, patient history, and epidemiological information. The expected result is Negative.  Fact Sheet for Patients:  EntrepreneurPulse.com.au  Fact Sheet for Healthcare Providers:  IncredibleEmployment.be  This test is no t yet approved or cleared by the [place] FDA and  has been authorized for detection and/or diagnosis of SARS-CoV-2 by FDA under an Emergency Use Authorization (EUA). This EUA will remain  in  effect (meaning this test can be used) for the duration of the COVID-19 declaration under Section 564(b)(1) of the Act, 21 U.S.C.section 360bbb-3(b)(1), unless the authorization is terminated  or revoked sooner.       Influenza A by PCR NEGATIVE NEGATIVE Final   Influenza B by PCR NEGATIVE NEGATIVE Final    Comment: (NOTE) The Xpert Xpress SARS-CoV-2/FLU/RSV plus assay is intended as an aid in the diagnosis of influenza from Nasopharyngeal swab specimens and should not be used as a sole basis for treatment. Nasal washings and aspirates are unacceptable for Xpert Xpress SARS-CoV-2/FLU/RSV testing.  Fact Sheet for Patients: EntrepreneurPulse.com.au  Fact Sheet for Healthcare  Providers: IncredibleEmployment.be  This test is not yet approved or cleared by the [place] FDA and has been authorized for detection and/or diagnosis of SARS-CoV-2 by FDA under an Emergency Use Authorization (EUA). This EUA will remain in effect (meaning this test can be used) for the duration of the COVID-19 declaration under Section 564(b)(1) of the Act, 21 U.S.C. section 360bbb-3(b)(1), unless the authorization is terminated or revoked.  Performed at [facility], [address] [place], [place] [address]   Blood Culture (routine x 2)     Status: None   Collection Time: 07/27/21  4:20 PM   Specimen: BLOOD  Result Value Ref Range Status   Specimen Description BLOOD LEFT ANTECUBITAL  Final   Special Requests   Final    BOTTLES DRAWN AEROBIC AND ANAEROBIC Blood Culture adequate volume   Culture   Final    NO GROWTH 5 DAYS Performed at [facility], [address] [place], [place] [address]    Report Status 08/01/2021 FINAL  Final  Blood Culture (routine x 2)     Status: None   Collection Time: 07/27/21  4:39 PM   Specimen: BLOOD  Result Value Ref Range Status   Specimen Description BLOOD BLOOD RIGHT FOREARM  Final   Special Requests   Final    BOTTLES DRAWN AEROBIC AND ANAEROBIC Blood Culture results may not be optimal due to an inadequate volume of blood received in culture bottles   Culture   Final    NO GROWTH 5 DAYS Performed at [facility], [address], [place], [place] [address]    Report Status 08/01/2021 FINAL  Final  Urine Culture     Status: Abnormal   Collection Time: 07/27/21  5:13 PM   Specimen: Urine, Catheterized  Result Value Ref Range Status   Specimen Description   Final    URINE, CATHETERIZED Performed at [facility], [address], [place], [place] [address]    Special Requests   Final    NONE Performed at [facility], [address] [place], [place] [address]     Culture >=100,000 COLONIES/mL PSEUDOMONAS AERUGINOSA (A)  Final   Report Status 07/30/2021 FINAL  Final   Organism ID, Bacteria PSEUDOMONAS AERUGINOSA (A)  Final      Susceptibility   Pseudomonas aeruginosa - MIC*    CEFTAZIDIME 4 SENSITIVE Sensitive     CIPROFLOXACIN <=0.25 SENSITIVE Sensitive     GENTAMICIN <=1 SENSITIVE Sensitive     IMIPENEM 1 SENSITIVE Sensitive     PIP/TAZO 8 SENSITIVE Sensitive     CEFEPIME 2 SENSITIVE Sensitive     * >=100,000 COLONIES/mL PSEUDOMONAS AERUGINOSA         Radiology Studies: US ARTERIAL LOWER EXTREMITY DUPLEX BILATERAL  Result Date: 08/01/2021 CLINICAL DATA:  70 year old female with a history of peripheral  vascular disease EXAM: NONINVASIVE PHYSIOLOGIC VASCULAR STUDY OF BILATERAL LOWER EXTREMITIES TECHNIQUE: Evaluation of both lower extremities was performed at rest, including calculation of ankle-brachial indices, directed duplex COMPARISON:  None. FINDINGS: Right ABI:  Not acquired Left ABI:  Not acquired Right Lower Extremity: Directed duplex of the right lower extremity demonstrates triphasic waveforms throughout. No elevated velocity identified. Left Lower Extremity: Directed duplex left lower extremity demonstrates triphasic waveforms throughout. No elevated velocities. IMPRESSION: Directed duplex of the bilateral lower extremities demonstrates waveforms maintained bilaterally Signed, Dulcy Fanny. Dellia Nims, RPVI [facility] Electronically Signed   By: Corrie Mckusick D.O.   On: 08/01/2021 10:15        Scheduled Meds:  aspirin EC  81 mg Oral Daily   donepezil  10 mg Oral QHS   DULoxetine  60 mg Oral Daily   enoxaparin (LOVENOX) injection  0.5 mg/kg Subcutaneous Q24H   insulin aspart  0-5 Units Subcutaneous QHS   insulin aspart  0-9 Units Subcutaneous TID WC   insulin glargine-yfgn  32 Units Subcutaneous Daily   mouth rinse  15 mL Mouth Rinse BID   nystatin   Topical BID   QUEtiapine   25 mg Oral QHS   simvastatin  40 mg Oral q morning   Continuous Infusions:  sodium chloride Stopped (07/31/21 0620)   ceFEPime (MAXIPIME) IV 2 g (08/01/21 0533)     LOS: 4 days    Time spent: 25 minutes    Sharen Hones, MD [facility]   To contact the attending provider between 7A-7P or the covering provider during after hours 7P-7A, please log into the web site [email or web address] and access using universal [place] password for that web site. If you do not have the password, please call the hospital operator.  08/01/2021, 10:19 AM

## 2021-08-01 NOTE — TOC Progression Note (Signed)
Transition of Care [facility]) - Progression Note    Patient Details  Name: Judith Shaw MRN: [id number] Date of Birth: 11-14-1950  Transition of Care [facility]) CM/SW [place], RN Phone Number: 08/01/2021, 3:20 PM  Clinical Narrative:   Submitted clinical notes to [facility] to obtain SNF Approval, ref number [id number]         Expected Discharge Plan and Services                                                 Social Determinants of Health (SDOH) Interventions    Readmission Risk Interventions No flowsheet data found.

## 2021-08-01 NOTE — Progress Notes (Signed)
Physical Therapy Treatment Patient Details Name: Judith Shaw MRN: [id number] DOB: 10/15/1950 Today's Date: 08/01/2021   History of Present Illness Judith Shaw is a 85yoF who comes to [facility] on 11/30 from outpatient provider for decreased PO intake c anorexia, weakness, unable to walk, multiple falls. CT head is negative and chest x-ray does not show any obvious infiltrate.  A CT angio of the chest abdomen pelvis was obtained by the previous provider which is read as may be a right-sided pyelo and cystitis.  She was covered with cefepime.  Required Haldol and Versed for significant agitation.    PT Comments    Pt in chair upon arrival, OT at bedside. [place] this session due to pt's heavy physical needs and complexity of symptoms. Pt still c/o paresthesias in bilat LE same as a few days ago, today now c/o significant loss of sensation in bilat hands. Strength remains similar to few days prior. +2 MaxA to come to standing, BLE severe weakness limiting safety and postural control, not tolerating much more than a few seconds in partial stance. Will continue to advance as able, but AMB this date is not a possibility given substantial weakness.      Recommendations for follow up therapy are one component of a multi-disciplinary discharge planning process, led by the attending physician.  Recommendations may be updated based on patient status, additional functional criteria and insurance authorization.  Follow Up Recommendations  Skilled nursing-short term rehab (<3 hours/day)     Assistance Recommended at Discharge Frequent or constant Supervision/Assistance  Equipment Recommendations  Other (comment)    Recommendations for Other Services       Precautions / Restrictions Precautions Precautions: Fall Restrictions Weight Bearing Restrictions: No     Mobility  Bed Mobility               General bed mobility comments: Pt seated in recliner    Transfers Overall transfer level: Needs  assistance Equipment used: None (sink/counter) Transfers: Sit to/from Stand Sit to Stand: Max assist;+2 physical assistance;+2 safety/equipment           General transfer comment: attempted twice at sink, tolerates less than 30sec each time due to progressive weakness of legs bilat.    Ambulation/Gait                   Stairs             Wheelchair Mobility    Modified Rankin (Stroke Patients Only)       Balance                                            Cognition Arousal/Alertness: Awake/alert Behavior During Therapy: WFL for tasks assessed/performed Overall Cognitive Status: Within Functional Limits for tasks assessed                                 General Comments: short term memory deficits at baseline        Exercises Other Exercises Other Exercises: STS from recliner x2 +2, education on safe technique, sustained standing attempts. Other Exercises: Seated hip extension, manually resisted from end range flexion to floor 1x5 bilat Other Exercises: Seated hip flexion, manually assisted from floor 1x5 bilat    General Comments        Pertinent Vitals/Pain Pain Assessment:  No/denies pain    Home Living                          Prior Function            PT Goals (current goals can now be found in the care plan section) Acute Rehab PT Goals Patient Stated Goal: return to ability well enough for DC to home. PT Goal Formulation: With patient Time For Goal Achievement: 08/12/21 Potential to Achieve Goals: Fair Progress towards PT goals: Not progressing toward goals - comment    Frequency    Min 2X/week      PT Plan Current plan remains appropriate    Co-evaluation PT/OT/SLP Co-Evaluation/Treatment: Yes Reason for Co-Treatment: Complexity of the patient's impairments (multi-system involvement);For patient/therapist safety;To address functional/ADL transfers PT goals addressed during  session: Mobility/safety with mobility;Balance;Strengthening/ROM OT goals addressed during session: ADL's and self-care;Proper use of Adaptive equipment and DME;Strengthening/ROM      AM-PAC PT "6 Clicks" Mobility   Outcome Measure  Help needed turning from your back to your side while in a flat bed without using bedrails?: Total Help needed moving from lying on your back to sitting on the side of a flat bed without using bedrails?: Total Help needed moving to and from a bed to a chair (including a wheelchair)?: Total Help needed standing up from a chair using your arms (e.g., wheelchair or bedside chair)?: Total Help needed to walk in hospital room?: Total Help needed climbing 3-5 steps with a railing? : Total 6 Click Score: 6    End of Session Equipment Utilized During Treatment: Gait belt Activity Tolerance: Patient tolerated treatment well;Patient limited by fatigue Patient left: in chair;with nursing/sitter in room;with family/visitor present Nurse Communication: Mobility status PT Visit Diagnosis: Unsteadiness on feet (R26.81);Other abnormalities of gait and mobility (R26.89);Muscle weakness (generalized) (M62.81);Repeated falls (R29.6);History of falling (Z91.81);Difficulty in walking, not elsewhere classified (R26.2);Other symptoms and signs involving the nervous system (R29.898)     Time: 8182-9937 PT Time Calculation (min) (ACUTE ONLY): 14 min  Charges:  $Therapeutic Activity: 8-22 mins                    2:48 PM, 08/01/21 Etta Grandchild, PT, DPT Physical Therapist - [facility]  [phone] ([place])    [place] C 08/01/2021, 2:46 PM

## 2021-08-01 NOTE — Progress Notes (Signed)
Occupational Therapy Treatment Patient Details Name: Judith Shaw MRN: [id number] DOB: 06/06/1951 Today's Date: 08/01/2021   History of present illness Judith Shaw is a 51yoF who comes to [facility] on 11/30 from outpatient provider for decreased PO intake c anorexia, weakness, unable to walk, multiple falls. CT head is negative and chest x-ray does not show any obvious infiltrate.  A CT angio of the chest abdomen pelvis was obtained by the previous provider which is read as may be a right-sided pyelo and cystitis.  She was covered with cefepime.  Required Haldol and Versed for significant agitation.   OT comments  Pt seen for OT treatment on this date. Mobility portion coordinated with PT to address functional/ADL transfers. Upon arrival to room pt awake and seated upright in recliner with caregiver present. Pt reporting no pain, however endorses "numbness" in b/l hands. Pt currently presents with decreased UE sensation, decreased short term/working memory, decreased strength, and decreased balance. Due to these functional impairments, pt requires MAX A+2 for sit<>stand attempts and SUPERVISION/SET-UP for seated grooming tasks. Pt and caregiver educated on importance of pt continuing to engage b/l UE in grooming tasks while in hospital to promote UE strength and maximize independence with ADLs; pt and caregiver verbalized understanding. Pt is making good progress toward goals and continues to benefit from skilled OT services to maximize return to PLOF and minimize risk of future falls, injury, caregiver burden, and readmission. Will continue to follow POC. Discharge recommendation remains appropriate.     Recommendations for follow up therapy are one component of a multi-disciplinary discharge planning process, led by the attending physician.  Recommendations may be updated based on patient status, additional functional criteria and insurance authorization.    Follow Up Recommendations  Skilled nursing-short  term rehab (<3 hours/day)    Assistance Recommended at Discharge Frequent or constant Supervision/Assistance  Equipment Recommendations  Other (comment) (defer to next venue of care)       Precautions / Restrictions Precautions Precautions: Fall Restrictions Weight Bearing Restrictions: No       Mobility Bed Mobility               General bed mobility comments: Pt seated in recliner at beginning/end of session    Transfers Overall transfer level: Needs assistance Equipment used:  (sink counter) Transfers: Sit to/from Stand Sit to Stand: Max assist;+2 physical assistance;+2 safety/equipment           General transfer comment: x2 bouts at sink, with pt tolerating standing less than 30sec each time     Balance Overall balance assessment: Needs assistance Sitting-balance support: No upper extremity supported;Feet supported Sitting balance-Leahy Scale: Fair Sitting balance - Comments: Able to maintain balance while seated in recliner during grooming tasks   Standing balance support: Bilateral upper extremity supported;During functional activity Standing balance-Leahy Scale: Zero Standing balance comment: Requires MAX A+2 to maintain static standing balance for only 30sec                           ADL either performed or assessed with clinical judgement   ADL Overall ADL's : Needs assistance/impaired     Grooming: Wash/dry hands;Wash/dry face;Oral care;Brushing hair;Supervision/safety;Set up;Sitting;Cueing for sequencing Grooming Details (indicate cue type and reason): Pt required MOD verbal cues for sequencing in sitting of decreased working memory (e.g., pt forgetting goal of oral care halfway through task)  Cognition Arousal/Alertness: Awake/alert Behavior During Therapy: WFL for tasks assessed/performed Overall Cognitive Status: Impaired/Different from baseline                                  General Comments: Caregiver reporting that pt has been having memory impairments over past few months, however reports that memory continues to be different from baseline          Exercises Other Exercises Other Exercises: Educated pt and caregiver on importance of pt continuing to engage b/l UE in grooming tasks while in hospital to promote UE strength and maximize independence with ADLs Other Exercises: Seated hip extension, manually resisted from end range flexion to floor 1x5 bilat Other Exercises: Seated hip flexion, manually assisted from floor 1x5 bilat           Pertinent Vitals/ Pain       Pain Assessment: No/denies pain         Frequency  Min 2X/week        Progress Toward Goals  OT Goals(current goals can now be found in the care plan section)  Progress towards OT goals: Progressing toward goals  Acute Rehab OT Goals Patient Stated Goal: to get stronger OT Goal Formulation: With patient Time For Goal Achievement: 08/12/21 Potential to Achieve Goals: Good  Plan Discharge plan remains appropriate;Frequency remains appropriate    Co-evaluation    PT/OT/SLP Co-Evaluation/Treatment: Yes Reason for Co-Treatment: Complexity of the patient's impairments (multi-system involvement);For patient/therapist safety;To address functional/ADL transfers PT goals addressed during session: Mobility/safety with mobility;Balance;Strengthening/ROM OT goals addressed during session: ADL's and self-care;Strengthening/ROM      AM-PAC OT "6 Clicks" Daily Activity     Outcome Measure   Help from another person eating meals?: None Help from another person taking care of personal grooming?: A Little Help from another person toileting, which includes using toliet, bedpan, or urinal?: A Lot Help from another person bathing (including washing, rinsing, drying)?: Total Help from another person to put on and taking off regular upper body clothing?: A Lot Help from  another person to put on and taking off regular lower body clothing?: Total 6 Click Score: 13    End of Session Equipment Utilized During Treatment: Gait belt  OT Visit Diagnosis: Unsteadiness on feet (R26.81);Muscle weakness (generalized) (M62.81)   Activity Tolerance Patient tolerated treatment well   Patient Left in chair;with call bell/phone within reach;with chair alarm set;with family/visitor present   Nurse Communication Mobility status        Time: 7858-8502 OT Time Calculation (min): 32 min  Charges: OT General Charges $OT Visit: 1 Visit OT Treatments $Self Care/Home Management : 8-22 mins  Fredirick Maudlin, OTR/L [place]

## 2021-08-01 NOTE — TOC Progression Note (Signed)
Transition of Care [facility]) - Progression Note    Patient Details  Name: Judith Shaw MRN: [id number] Date of Birth: Apr 06, 1951  Transition of Care [facility]) CM/SW [place], RN Phone Number: 08/01/2021, 9:24 AM  Clinical Narrative:   No Bed offers at this time, Resent out to a broader area, Reached out to local facilities requesting them to review for bed offer, Anticipate DC tomorrow         Expected Discharge Plan and Services                                                 Social Determinants of Health (SDOH) Interventions    Readmission Risk Interventions No flowsheet data found.

## 2021-08-02 LAB — GLUCOSE, CAPILLARY
Glucose-Capillary: 169 mg/dL — ABNORMAL HIGH (ref 70–99)
Glucose-Capillary: 203 mg/dL — ABNORMAL HIGH (ref 70–99)

## 2021-08-02 MED ORDER — CIPROFLOXACIN HCL 500 MG PO TABS: 500.0000 mg | ORAL_TABLET | Freq: Two times a day (BID) | ORAL | 0 refills | Status: AC

## 2021-08-02 MED ORDER — QUETIAPINE FUMARATE 25 MG PO TABS: 25.0000 mg | ORAL_TABLET | Freq: Every day | ORAL | Status: DC

## 2021-08-02 MED ORDER — TRESIBA FLEXTOUCH 200 UNIT/ML ~~LOC~~ SOPN: 32.0000 [IU] | PEN_INJECTOR | Freq: Every evening | SUBCUTANEOUS | Status: DC

## 2021-08-02 NOTE — Care Management Important Message (Signed)
Important Message  Patient Details  Name: Judith Shaw MRN: [id number] Date of Birth: 09-08-1950   Medicare Important Message Given:  Yes  I reviewed the Important Message from Medicare with the patient and she said her sister was her legal guardian and to call her.  I called and reviewed the Important Message from Medicare with Judith Shaw [phone]. I asked if she would like me to send a copy and she replied yes. I have sent via secure e-mail to [email or web address] as requested. She asks that the MD to call her and I relayed that message to the RN CM to reach out to the MD. I thanked her for time.   Judith Shaw 08/02/2021, 12:09 PM

## 2021-08-02 NOTE — Discharge Summary (Signed)
Physician Discharge Summary  Patient ID: Judith Shaw MRN: [id number] DOB/AGE: November 06, 1950 70 y.o.  Admit date: 07/27/2021 Discharge date: 08/02/2021  Admission Diagnoses:  Discharge Diagnoses:  Principal Problem:   Severe sepsis with acute organ dysfunction ([place]) Active Problems:   Sepsis ([place])   UTI (urinary tract infection)   Sleep apnea   Type 2 diabetes mellitus with diabetic nephropathy, with long-term current use of insulin ([place])   Acute metabolic encephalopathy   Acute pyelonephritis   Severe sepsis ([place])   Discharged Condition: fair  Hospital Course:  Judith Shaw is a 70 y.o. female with medical history significant for obstructive sleep apnea, obesity, anxiety, hypertension, history of fatty liver, hyperlipidemia, history of uncontrolled insulin-dependent diabetes mellitus type 2, who presents emergency department for chief concerns of altered mental status. Patient diagnosed with UTI and severe sepsis.  Given fluids and antibiotics.  Blood culture negative, urine culture grows Pseudomonas aeruginosa.  Continue cefepime.  Change to Cipro at time of discharge  Severe sepsis secondary to pyelonephritis due to Pseudomonas Acute pyelonephritis due to Pseudomonas  Acute metabolic encephalopathy secondary to pyelonephritis. Patient has improved.  Currently on cefepime.  Changed to Cipro 500 mg twice a day.  I decided treated for total course of 10 days, as patient blood culture has been negative.    Acute kidney injury secondary to severe sepsis Metabolic acidosis secondary to renal failure. Hyperkalemia. Conditions are improved.   Morbid obesity. Obstructive sleep apnea. Not tolerating CPAP.  Uncontrolled type 2 diabetes with hyperglycemia Patient currently on 32 units of Lantus, glucose controlled.  We will continue same regimen.     Chronic dementia. Continue home medicines   Bilateral foot discoloration. Ultrasound did not show significant arterial  blockage.  Consults: None  Significant Diagnostic Studies:   Treatments: IV cefepime  Discharge Exam: Blood pressure (!) 193/87, pulse 77, temperature 98.2 F (36.8 C), temperature source Oral, resp. rate 16, height 5\' 5"  (1.651 m), weight 114 kg, SpO2 94 %. General appearance: alert and cooperative Resp: clear to auscultation bilaterally Cardio: regular rate and rhythm, S1, S2 normal, no murmur, click, rub or gallop GI: soft, non-tender; bowel sounds normal; no masses,  no organomegaly Extremities: extremities normal, atraumatic, no cyanosis or edema  Disposition: Discharge disposition: 03-Skilled Nursing Facility       Discharge Instructions     Diet - low sodium heart healthy   Complete by: As directed    Increase activity slowly   Complete by: As directed       Allergies as of 08/02/2021       Reactions   Codeine         Medication List     TAKE these medications    acetaminophen 325 MG tablet Commonly known as: TYLENOL Take 2 tablets (650 mg total) by mouth every 6 (six) hours as needed.   aluminum-magnesium hydroxide-simethicone 664-403-47 MG/5ML Susp Commonly known as: MAALOX Take 30 mLs by mouth 4 (four) times daily -  before meals and at bedtime.   aspirin EC 81 MG tablet Take 81 mg by mouth daily.   ciprofloxacin 500 MG tablet Commonly known as: Cipro Take 1 tablet (500 mg total) by mouth 2 (two) times daily for 5 days.   clotrimazole-betamethasone cream Commonly known as: LOTRISONE Apply 1 application topically 2 (two) times daily.   donepezil 10 MG tablet Commonly known as: ARICEPT Take 10 mg by mouth at bedtime.   DULoxetine 60 MG capsule Commonly known as: CYMBALTA Take 60 mg  by mouth daily.   famotidine 20 MG tablet Commonly known as: PEPCID Take 1 tablet (20 mg total) by mouth 2 (two) times daily.   furosemide 20 MG tablet Commonly known as: LASIX Take 20 mg by mouth every other day.   gentian violet 2 % topical  solution Apply 0.5 mLs topically in the morning and at bedtime. Apply between the toes twice daily.   Insulin Aspart FlexPen 100 UNIT/ML Commonly known as: NOVOLOG Inject 0-15 Units into the skin 3 (three) times daily with meals.   mirabegron ER 25 MG Tb24 tablet Commonly known as: MYRBETRIQ Take 1 tablet (25 mg total) by mouth daily.   ondansetron 4 MG disintegrating tablet Commonly known as: Zofran ODT Take 1 tablet (4 mg total) by mouth every 8 (eight) hours as needed for nausea or vomiting.   potassium chloride 10 MEQ tablet Commonly known as: KLOR-CON Take 10 mEq by mouth daily.   QUEtiapine 25 MG tablet Commonly known as: SEROQUEL Take 1 tablet (25 mg total) by mouth at bedtime. What changed:  medication strength how much to take when to take this   ramipril 5 MG capsule Commonly known as: ALTACE Take 5 mg by mouth daily.   simvastatin 40 MG tablet Commonly known as: ZOCOR Take 40 mg by mouth every morning.   Tyler Aas FlexTouch 200 UNIT/ML FlexTouch Pen Generic drug: insulin degludec Inject 32 Units into the skin every evening. Between 7-9 pm What changed: how much to take   Trulicity 4.5 CV/8.9FY Sopn Generic drug: Dulaglutide Inject 4.5 mg into the skin once a week.        Contact information for after-discharge care     Destination     [facility] Preferred SNF .   Service: Skilled Nursing Contact information: [address] [place] [phone]                    35 minutes Signed: Sharen Hones 08/02/2021, 10:11 AM

## 2021-08-02 NOTE — H&P (Signed)
Report called to [facility] - Nurse Caryl Pina LPN.

## 2021-08-02 NOTE — TOC Progression Note (Signed)
Transition of Care [facility]) - Progression Note    Patient Details  Name: Judith Shaw MRN: [id number] Date of Birth: 09/02/1950  Transition of Care [facility]) CM/SW [place], RN Phone Number: 08/02/2021, 10:19 AM  Clinical Narrative:   Judith Shaw to  Judith Shaw the patients sister and legal guardian, explained that she will  transport via EMS and got o [facility] today to roomn [address]         Expected Discharge Plan and Services           Expected Discharge Date: 08/02/21                                     Social Determinants of Health (SDOH) Interventions    Readmission Risk Interventions No flowsheet data found.

## 2021-08-12 ENCOUNTER — Encounter: Payer: Self-pay | Admitting: Emergency Medicine

## 2021-08-12 ENCOUNTER — Inpatient Hospital Stay
Admission: EM | Admit: 2021-08-12 | Discharge: 2021-08-18 | DRG: 177 | Disposition: A | Discharge status: Skilled Nursing Facility | Payer: Medicare PPO | Source: Skilled Nursing Facility | Attending: Student in an Organized Health Care Education/Training Program | Admitting: Student in an Organized Health Care Education/Training Program

## 2021-08-12 ENCOUNTER — Inpatient Hospital Stay: Payer: Medicare PPO

## 2021-08-12 ENCOUNTER — Other Ambulatory Visit: Payer: Self-pay

## 2021-08-12 ENCOUNTER — Emergency Department: Payer: Medicare PPO

## 2021-08-12 DIAGNOSIS — R5383 Other fatigue: Secondary | ICD-10-CM

## 2021-08-12 DIAGNOSIS — Z6841 Body Mass Index (BMI) 40.0 and over, adult: Secondary | ICD-10-CM

## 2021-08-12 DIAGNOSIS — G9341 Metabolic encephalopathy: Secondary | ICD-10-CM | POA: Diagnosis present

## 2021-08-12 DIAGNOSIS — J9601 Acute respiratory failure with hypoxia: Secondary | ICD-10-CM | POA: Diagnosis present

## 2021-08-12 DIAGNOSIS — I110 Hypertensive heart disease with heart failure: Secondary | ICD-10-CM | POA: Diagnosis present

## 2021-08-12 DIAGNOSIS — G8929 Other chronic pain: Secondary | ICD-10-CM | POA: Diagnosis present

## 2021-08-12 DIAGNOSIS — N3001 Acute cystitis with hematuria: Secondary | ICD-10-CM | POA: Diagnosis not present

## 2021-08-12 DIAGNOSIS — Z8542 Personal history of malignant neoplasm of other parts of uterus: Secondary | ICD-10-CM | POA: Diagnosis not present

## 2021-08-12 DIAGNOSIS — E785 Hyperlipidemia, unspecified: Secondary | ICD-10-CM | POA: Diagnosis present

## 2021-08-12 DIAGNOSIS — N3281 Overactive bladder: Secondary | ICD-10-CM | POA: Diagnosis present

## 2021-08-12 DIAGNOSIS — F32A Depression, unspecified: Secondary | ICD-10-CM | POA: Diagnosis present

## 2021-08-12 DIAGNOSIS — E1165 Type 2 diabetes mellitus with hyperglycemia: Secondary | ICD-10-CM | POA: Diagnosis present

## 2021-08-12 DIAGNOSIS — I5042 Chronic combined systolic (congestive) and diastolic (congestive) heart failure: Secondary | ICD-10-CM | POA: Diagnosis present

## 2021-08-12 DIAGNOSIS — B379 Candidiasis, unspecified: Secondary | ICD-10-CM | POA: Diagnosis present

## 2021-08-12 DIAGNOSIS — Z7985 Long-term (current) use of injectable non-insulin antidiabetic drugs: Secondary | ICD-10-CM

## 2021-08-12 DIAGNOSIS — Z8639 Personal history of other endocrine, nutritional and metabolic disease: Secondary | ICD-10-CM | POA: Diagnosis not present

## 2021-08-12 DIAGNOSIS — I10 Essential (primary) hypertension: Secondary | ICD-10-CM | POA: Diagnosis not present

## 2021-08-12 DIAGNOSIS — E119 Type 2 diabetes mellitus without complications: Secondary | ICD-10-CM

## 2021-08-12 DIAGNOSIS — Z20822 Contact with and (suspected) exposure to covid-19: Secondary | ICD-10-CM | POA: Diagnosis present

## 2021-08-12 DIAGNOSIS — G9340 Encephalopathy, unspecified: Secondary | ICD-10-CM

## 2021-08-12 DIAGNOSIS — N179 Acute kidney failure, unspecified: Secondary | ICD-10-CM | POA: Diagnosis present

## 2021-08-12 DIAGNOSIS — E875 Hyperkalemia: Secondary | ICD-10-CM | POA: Diagnosis present

## 2021-08-12 DIAGNOSIS — F03A0 Unspecified dementia, mild, without behavioral disturbance, psychotic disturbance, mood disturbance, and anxiety: Secondary | ICD-10-CM | POA: Diagnosis present

## 2021-08-12 DIAGNOSIS — Z87440 Personal history of urinary (tract) infections: Secondary | ICD-10-CM

## 2021-08-12 DIAGNOSIS — Z90710 Acquired absence of both cervix and uterus: Secondary | ICD-10-CM

## 2021-08-12 DIAGNOSIS — Z833 Family history of diabetes mellitus: Secondary | ICD-10-CM | POA: Diagnosis not present

## 2021-08-12 DIAGNOSIS — Z7982 Long term (current) use of aspirin: Secondary | ICD-10-CM

## 2021-08-12 DIAGNOSIS — J690 Pneumonitis due to inhalation of food and vomit: Secondary | ICD-10-CM | POA: Diagnosis present

## 2021-08-12 DIAGNOSIS — Z885 Allergy status to narcotic agent status: Secondary | ICD-10-CM

## 2021-08-12 DIAGNOSIS — Z794 Long term (current) use of insulin: Secondary | ICD-10-CM

## 2021-08-12 DIAGNOSIS — Z8249 Family history of ischemic heart disease and other diseases of the circulatory system: Secondary | ICD-10-CM

## 2021-08-12 DIAGNOSIS — F419 Anxiety disorder, unspecified: Secondary | ICD-10-CM | POA: Diagnosis present

## 2021-08-12 DIAGNOSIS — G4733 Obstructive sleep apnea (adult) (pediatric): Secondary | ICD-10-CM | POA: Diagnosis present

## 2021-08-12 DIAGNOSIS — M545 Low back pain, unspecified: Secondary | ICD-10-CM | POA: Diagnosis present

## 2021-08-12 DIAGNOSIS — N390 Urinary tract infection, site not specified: Secondary | ICD-10-CM | POA: Diagnosis not present

## 2021-08-12 DIAGNOSIS — Z79899 Other long term (current) drug therapy: Secondary | ICD-10-CM

## 2021-08-12 LAB — CBC WITH DIFFERENTIAL/PLATELET
Abs Immature Granulocytes: 0.02 10*3/uL (ref 0.00–0.07)
Basophils Absolute: 0.1 10*3/uL (ref 0.0–0.1)
Basophils Relative: 1 %
Eosinophils Absolute: 0.5 10*3/uL (ref 0.0–0.5)
Eosinophils Relative: 6 %
HCT: 45.7 % (ref 36.0–46.0)
Hemoglobin: 14.6 g/dL (ref 12.0–15.0)
Immature Granulocytes: 0 %
Lymphocytes Relative: 26 %
Lymphs Abs: 2.1 10*3/uL (ref 0.7–4.0)
MCH: 28.6 pg (ref 26.0–34.0)
MCHC: 31.9 g/dL (ref 30.0–36.0)
MCV: 89.6 fL (ref 80.0–100.0)
Monocytes Absolute: 0.7 10*3/uL (ref 0.1–1.0)
Monocytes Relative: 9 %
Neutro Abs: 4.8 10*3/uL (ref 1.7–7.7)
Neutrophils Relative %: 58 %
Platelets: 284 10*3/uL (ref 150–400)
RBC: 5.1 MIL/uL (ref 3.87–5.11)
RDW: 13.6 % (ref 11.5–15.5)
WBC: 8.1 10*3/uL (ref 4.0–10.5)
nRBC: 0 % (ref 0.0–0.2)

## 2021-08-12 LAB — COMPREHENSIVE METABOLIC PANEL
ALT: 35 U/L (ref 0–44)
AST: 41 U/L (ref 15–41)
Albumin: 3.1 g/dL — ABNORMAL LOW (ref 3.5–5.0)
Alkaline Phosphatase: 116 U/L (ref 38–126)
Anion gap: 8 (ref 5–15)
BUN: 62 mg/dL — ABNORMAL HIGH (ref 8–23)
CO2: 23 mmol/L (ref 22–32)
Calcium: 9.2 mg/dL (ref 8.9–10.3)
Chloride: 105 mmol/L (ref 98–111)
Creatinine, Ser: 1.57 mg/dL — ABNORMAL HIGH (ref 0.44–1.00)
GFR, Estimated: 35 mL/min — ABNORMAL LOW (ref >60)
Glucose, Bld: 236 mg/dL — ABNORMAL HIGH (ref 70–99)
Potassium: 5.3 mmol/L — ABNORMAL HIGH (ref 3.5–5.1)
Sodium: 136 mmol/L (ref 135–145)
Total Bilirubin: 0.6 mg/dL (ref 0.3–1.2)
Total Protein: 7.3 g/dL (ref 6.5–8.1)

## 2021-08-12 LAB — URINALYSIS, COMPLETE (UACMP) WITH MICROSCOPIC
Bacteria, UA: NONE SEEN
Bilirubin Urine: NEGATIVE
Glucose, UA: NEGATIVE mg/dL
Nitrite: NEGATIVE
Protein, ur: 30 mg/dL — AB
Specific Gravity, Urine: 1.02 (ref 1.005–1.030)
WBC, UA: >50 WBC/hpf — ABNORMAL HIGH (ref 0–5)
pH: 5 (ref 5.0–8.0)

## 2021-08-12 LAB — BLOOD GAS, VENOUS
Acid-base deficit: 1.6 mmol/L (ref 0.0–2.0)
Bicarbonate: 27.2 mmol/L (ref 20.0–28.0)
O2 Saturation: 80.8 %
Patient temperature: 37
pCO2, Ven: 62 mmHg — ABNORMAL HIGH (ref 44.0–60.0)
pH, Ven: 7.25 (ref 7.250–7.430)
pO2, Ven: 53 mmHg — ABNORMAL HIGH (ref 32.0–45.0)

## 2021-08-12 LAB — BRAIN NATRIURETIC PEPTIDE: B Natriuretic Peptide: 11.1 pg/mL (ref 0.0–100.0)

## 2021-08-12 LAB — RESP PANEL BY RT-PCR (FLU A&B, COVID) ARPGX2
Influenza A by PCR: NEGATIVE
Influenza B by PCR: NEGATIVE
SARS Coronavirus 2 by RT PCR: NEGATIVE

## 2021-08-12 LAB — CK: Total CK: 378 U/L — ABNORMAL HIGH (ref 38–234)

## 2021-08-12 LAB — GLUCOSE, CAPILLARY
Glucose-Capillary: 220 mg/dL — ABNORMAL HIGH (ref 70–99)
Glucose-Capillary: 277 mg/dL — ABNORMAL HIGH (ref 70–99)

## 2021-08-12 LAB — CBG MONITORING, ED: Glucose-Capillary: 233 mg/dL — ABNORMAL HIGH (ref 70–99)

## 2021-08-12 LAB — TROPONIN I (HIGH SENSITIVITY): Troponin I (High Sensitivity): 10 ng/L (ref <18)

## 2021-08-12 LAB — D-DIMER, QUANTITATIVE: D-Dimer, Quant: 2.84 ug/mL-FEU — ABNORMAL HIGH (ref 0.00–0.50)

## 2021-08-12 IMAGING — CT CT CHEST W/O CM
2 of 4 series · 15 of 36 positions shown, 18 images · non-contrast
Comparison: [DATE]

CLINICAL DATA: Respiratory illness, nondiagnostic x-ray.

EXAM:
CT CHEST WITHOUT CONTRAST
TECHNIQUE: Multidetector CT imaging of the chest was performed following the
standard protocol without IV contrast.

[Series 3: thorax · axial · 0.74mm/px · z∈[-520,-276]mm · 12 of 146 slices shown, 15 images]
[im 12/146  mediastinal]
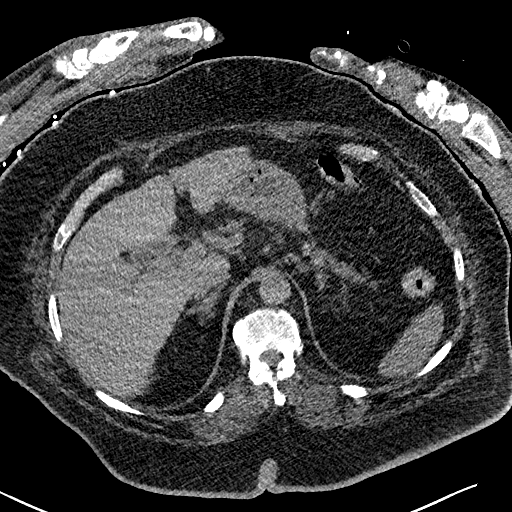
[im 12/146  lung]
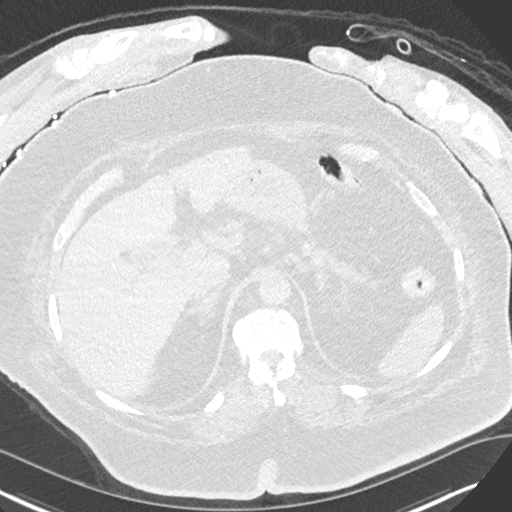
[im 23/146  lung]
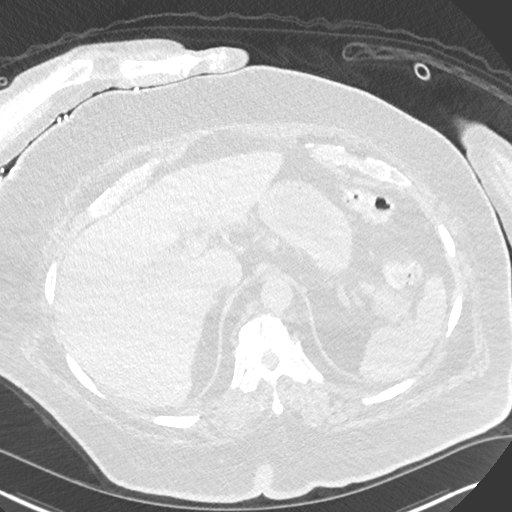
[im 34/146  lung]
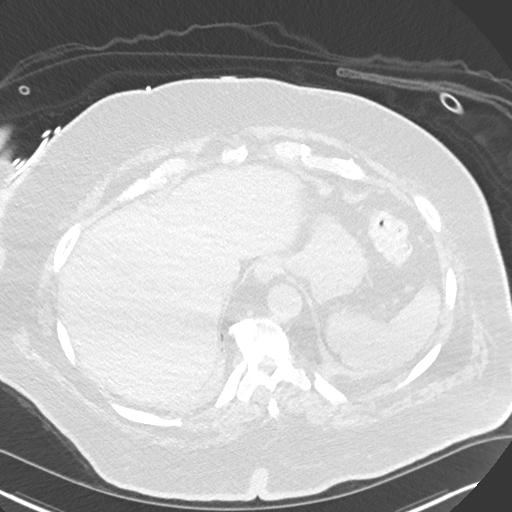
[im 45/146  lung]
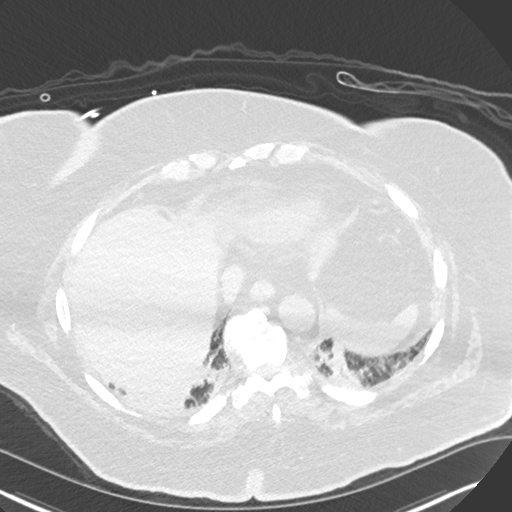
[im 56/146  mediastinal]
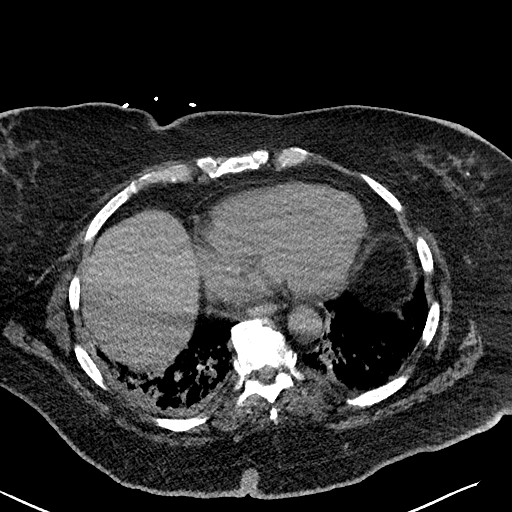
[im 56/146  lung]
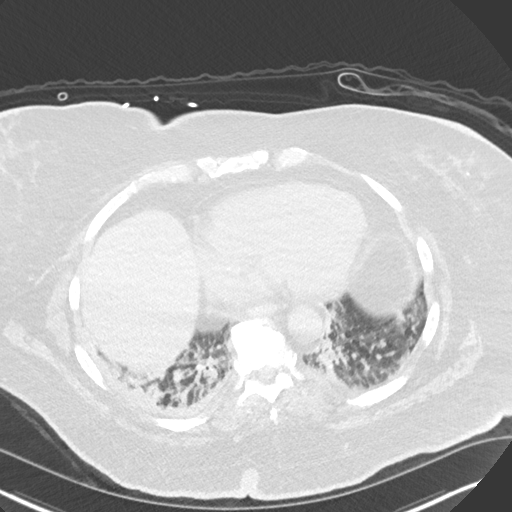
[im 67/146  lung]
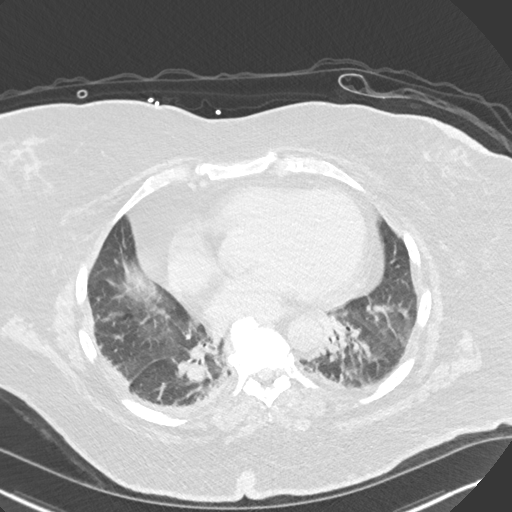
[im 79/146  lung]
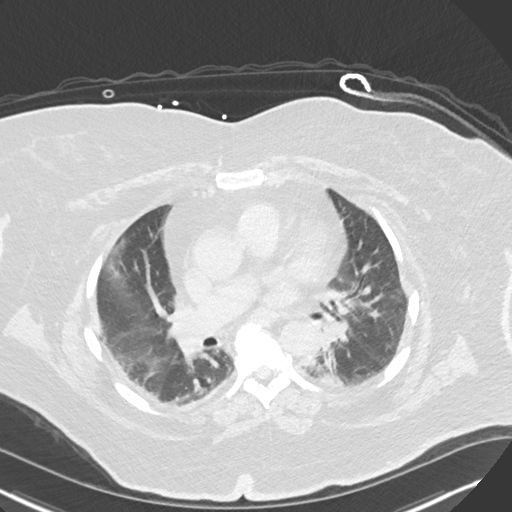
[im 90/146  lung]
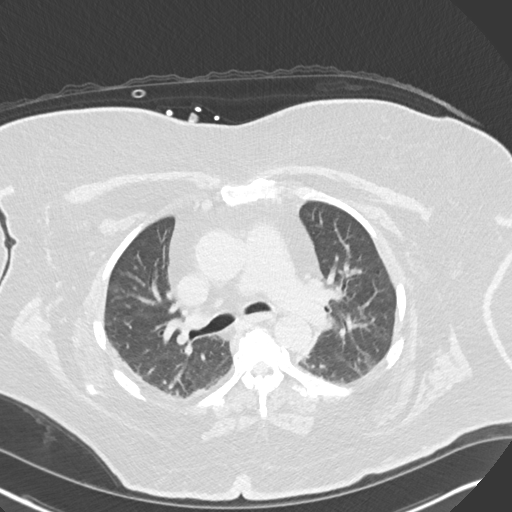
[im 101/146  mediastinal]
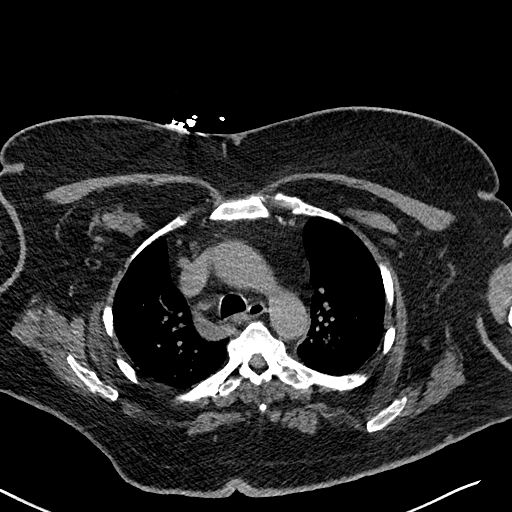
[im 101/146  lung]
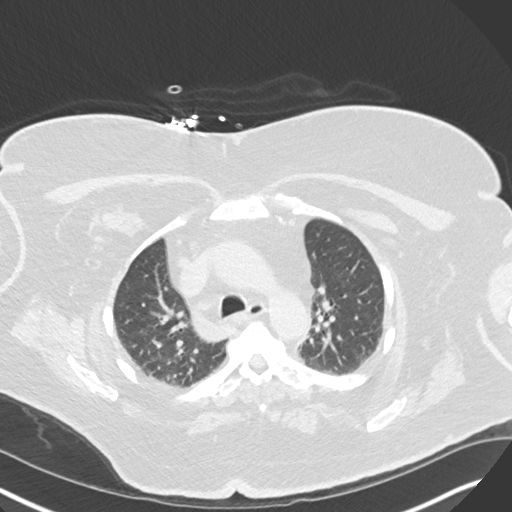
[im 112/146  lung]
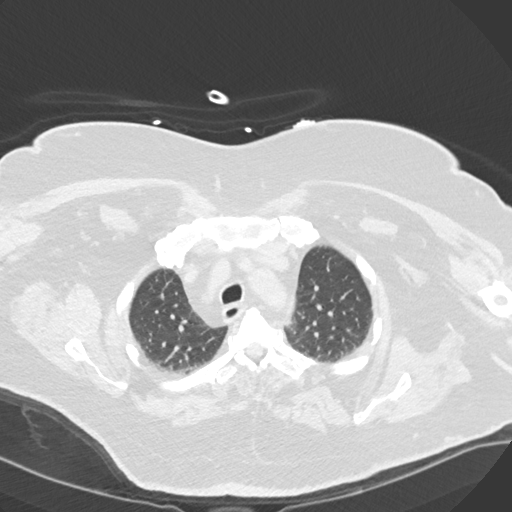
[im 123/146  lung]
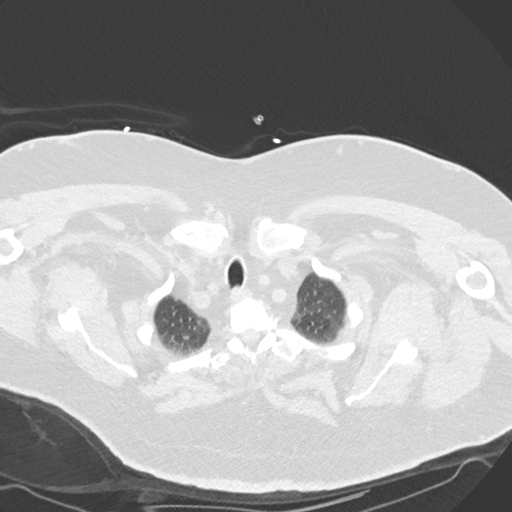
[im 134/146  lung]
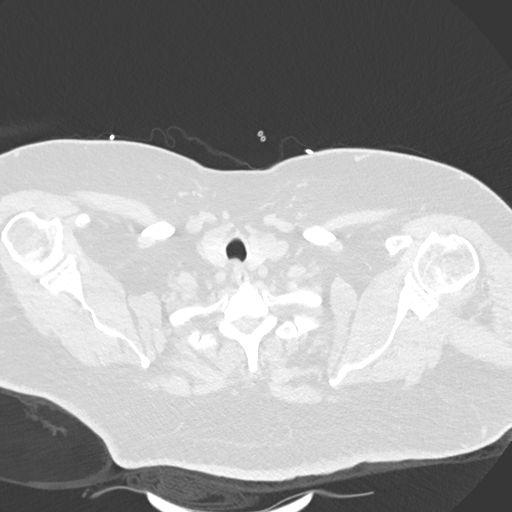

[Series 6: coronal · coronal · 0.62mm/px · 3 of 150 slices shown]
[im 30/150  lung]
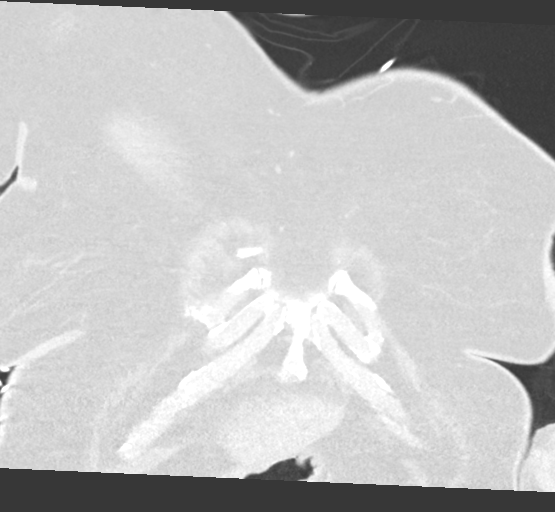
[im 60/150  lung]
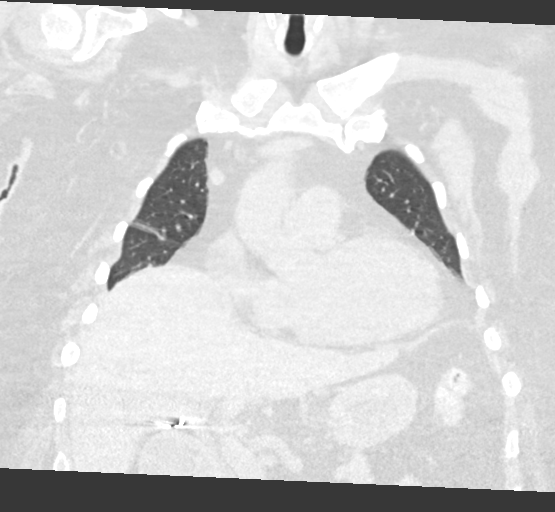
[im 90/150  lung]
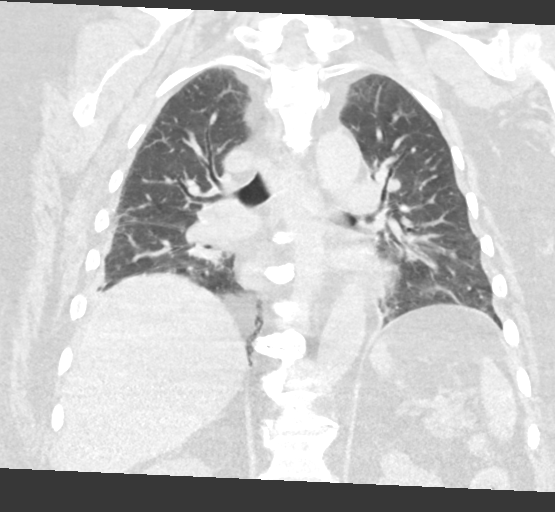

[15 of 36 positions shown; findings below may reference images not displayed]

FINDINGS: Cardiovascular: Heart size is normal without significant pericardial
fluid. Normal caliber of the thoracic aorta.

Mediastinum/Nodes: No evidence for chest lymphadenopathy. Esophagus
is unremarkable. No significant axillary lymph node enlargement.
Thyroid tissue is unremarkable.

Lungs/Pleura: Trachea and mainstem bronchi are patent. No pleural
effusions. Increased consolidation in the posterior lower lobes
bilaterally. Again noted are hazy densities in the lingula.

Upper Abdomen: Cholecystectomy. Again noted is cortical scarring in
the left kidney that is only partially imaged. No acute abnormality
in the visualized upper abdomen.

Musculoskeletal: No acute bone abnormality.
IMPRESSION: Increased patchy densities in the posterior lower lobes compared to
the previous chest CT. Findings could be related to volume loss but
cannot exclude areas of infection.

## 2021-08-12 IMAGING — CR DG CHEST 1V
1 series · 1 of 1 positions shown · non-contrast
Comparison: Chest radiograph [DATE]

CLINICAL DATA: Lethargic, hypoxia, history of CHF

EXAM:
CHEST  1 VIEW

[chest ap]
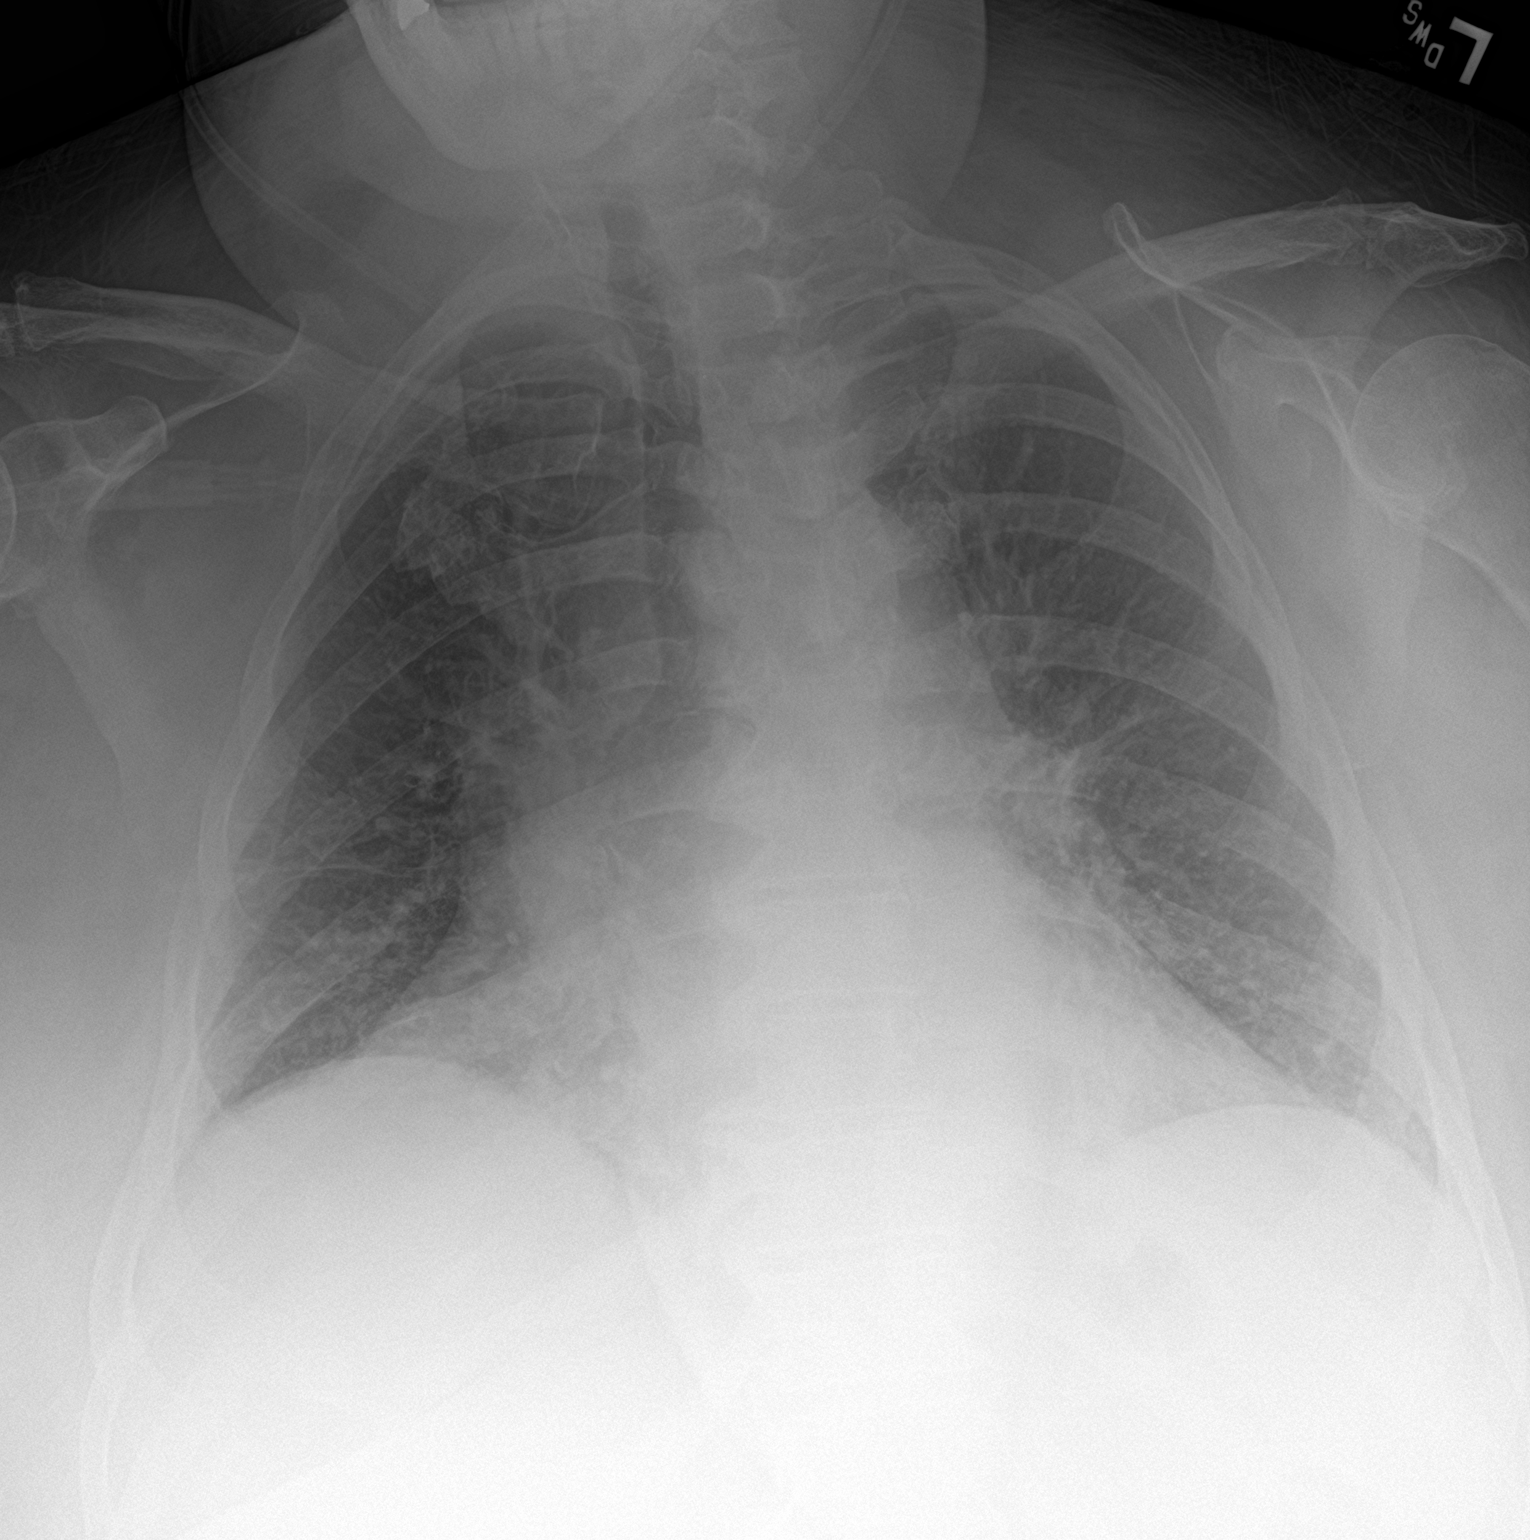

[1 of 1 positions shown; findings below may reference images not displayed]

FINDINGS: The heart is enlarged, similar to the prior study. The upper
mediastinal contours are stable.

There is vascular congestion without definite overt pulmonary
interstitial edema. There is no focal consolidation. There is no
pleural effusion or pneumothorax.

There is no acute osseous abnormality.
IMPRESSION: Cardiomegaly with vascular congestion but no definite overt
pulmonary edema.

## 2021-08-12 IMAGING — CT CT HEAD W/O CM
4 series · 17 of 47 positions shown, 19 images · non-contrast
Comparison: [DATE]

CLINICAL DATA: Mental status change, unknown cause.

EXAM:
CT HEAD WITHOUT CONTRAST
TECHNIQUE: Contiguous axial images were obtained from the base of the skull
through the vertex without intravenous contrast.

[Series 3: head wo · axial · 0.39mm/px · z∈[-135,-15]mm · 7 of 32 slices shown, 9 images]
[im 4/32  brain]
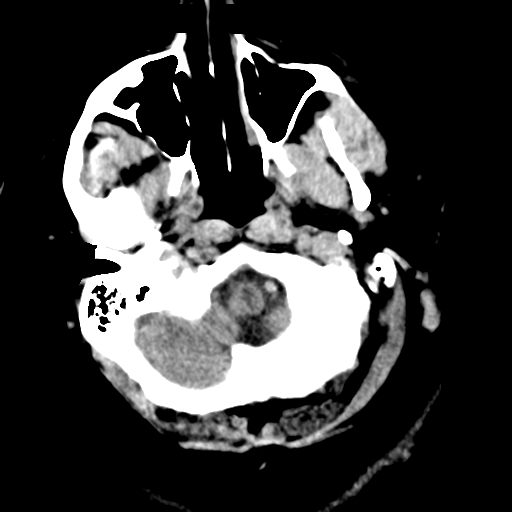
[im 4/32  bone]
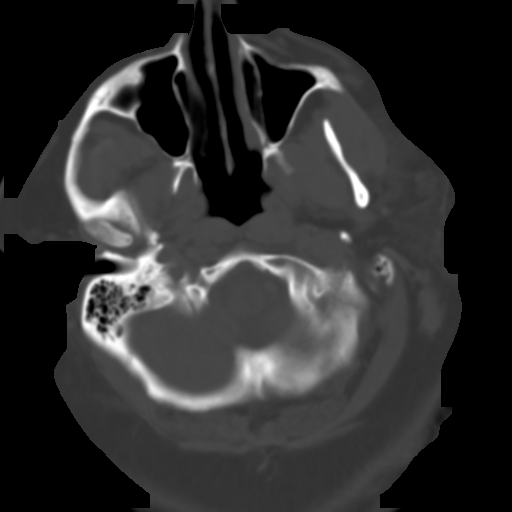
[im 8/32  brain]
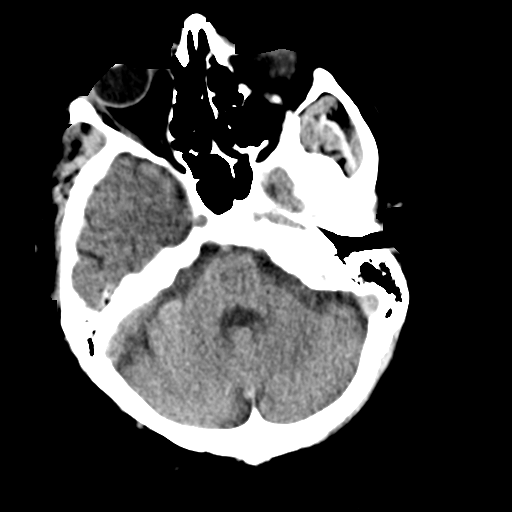
[im 12/32  brain]
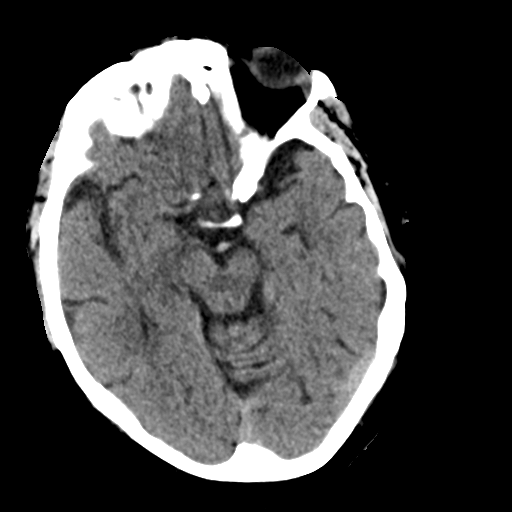
[im 16/32  brain]
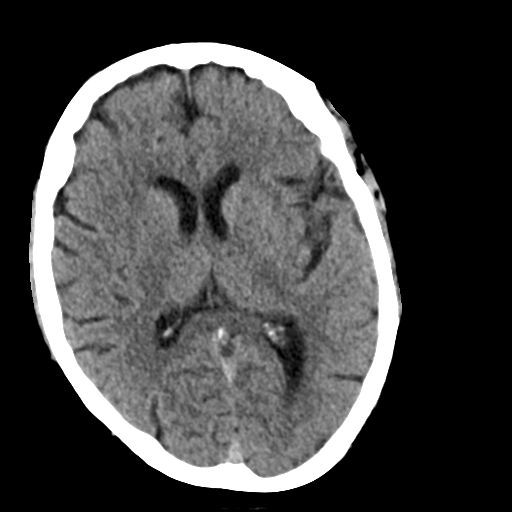
[im 20/32  brain]
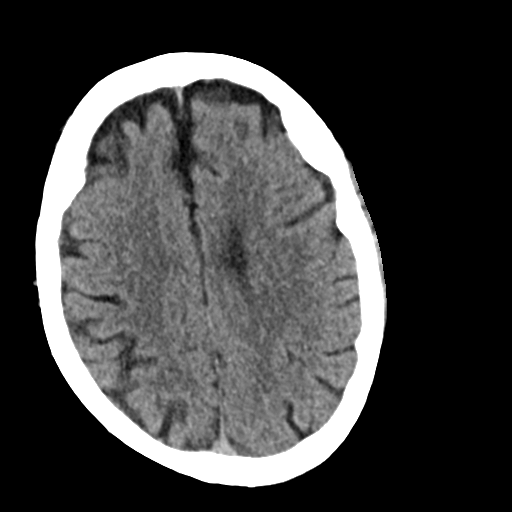
[im 20/32  bone]
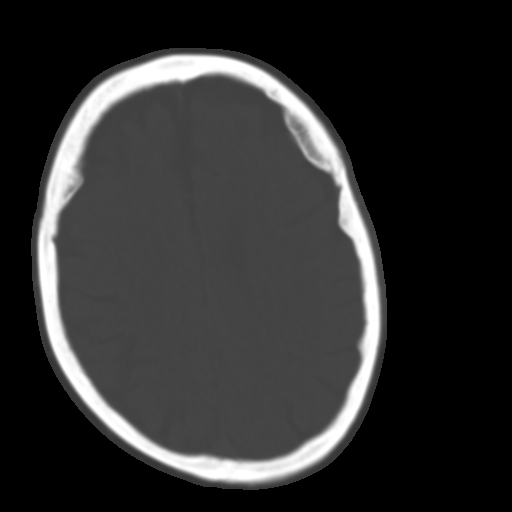
[im 24/32  brain]
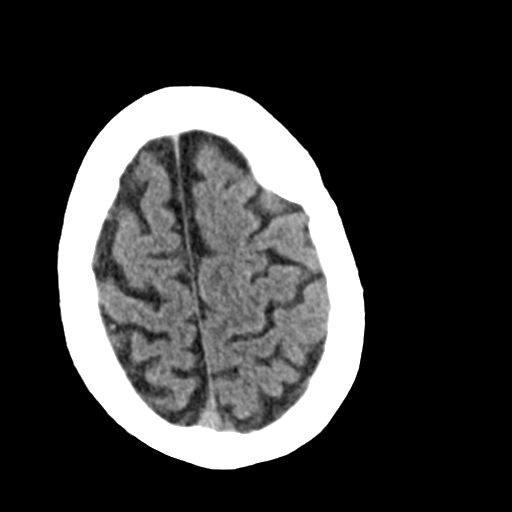
[im 28/32  brain]
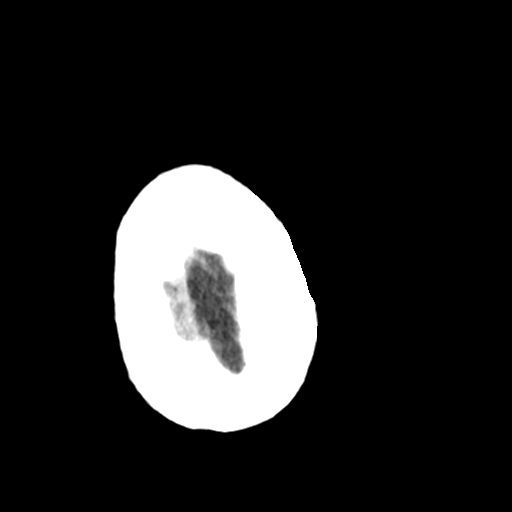

[Series 4: head bone · axial · 0.39mm/px · z∈[-136,-80]mm · 4 of 79 slices shown]
[im 8/79  bone]
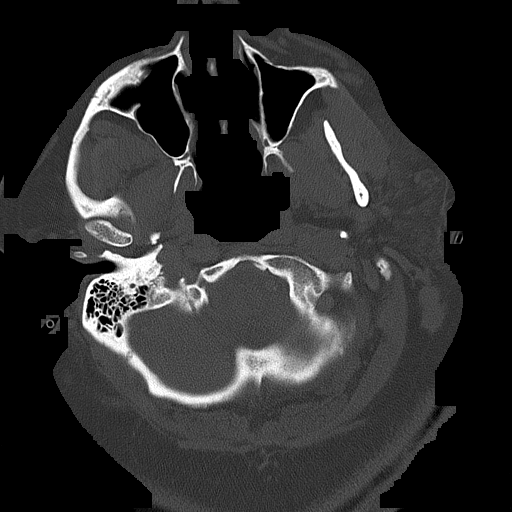
[im 16/79  bone]
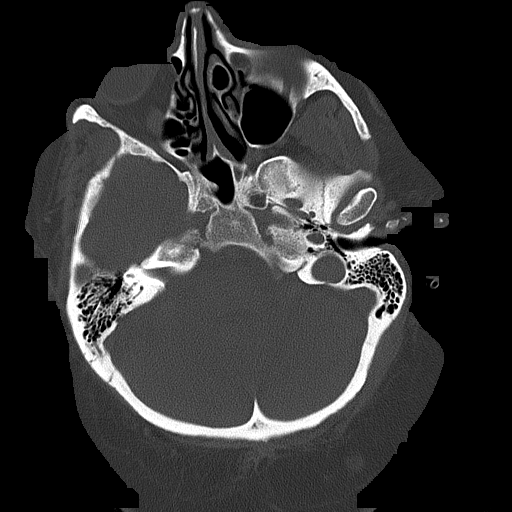
[im 24/79  bone]
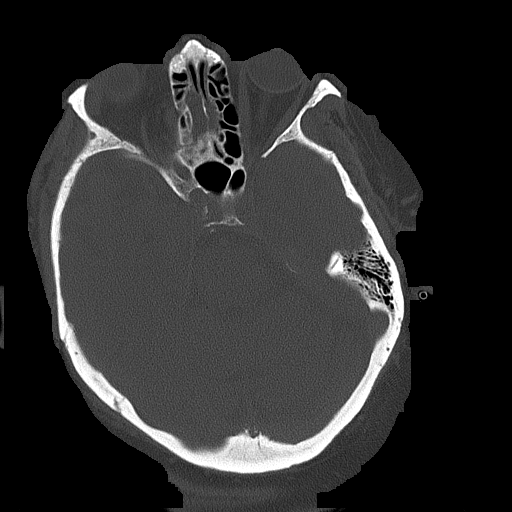
[im 36/79  bone]
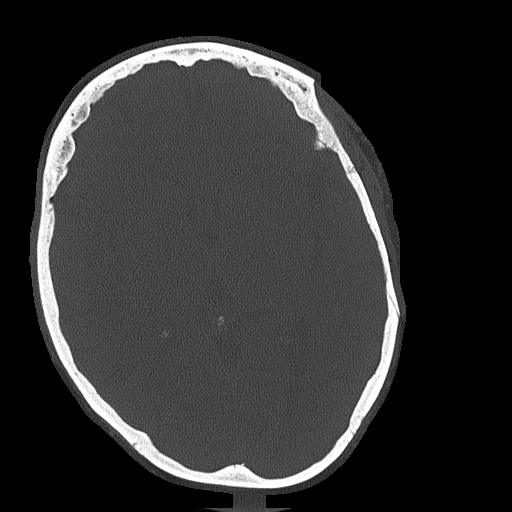

[Series 5: coronal soft tissue · coronal · 0.32mm/px · 3 of 68 slices shown]
[im 23/68  brain]
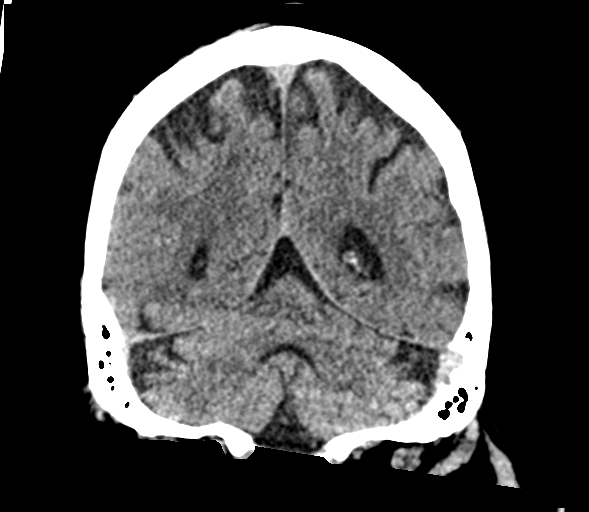
[im 30/68  brain]
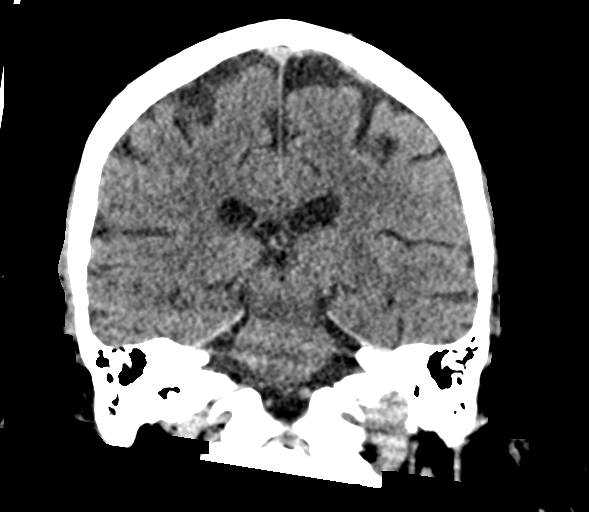
[im 38/68  brain]
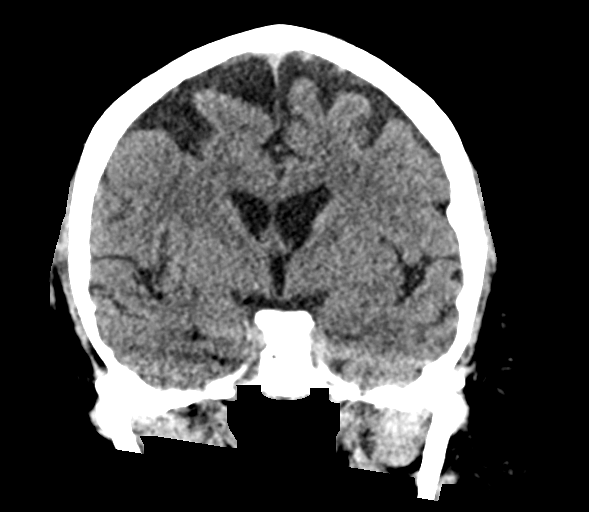

[Series 6: sagittal soft tissue · sagittal · 0.32mm/px · 3 of 64 slices shown]
[im 26/64  brain]
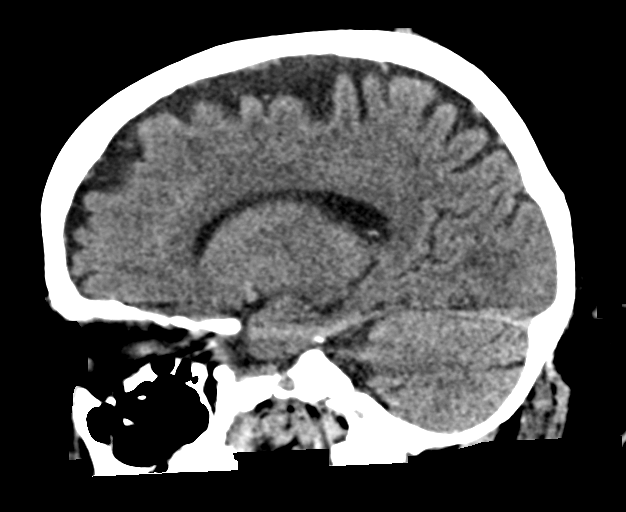
[im 32/64  brain]
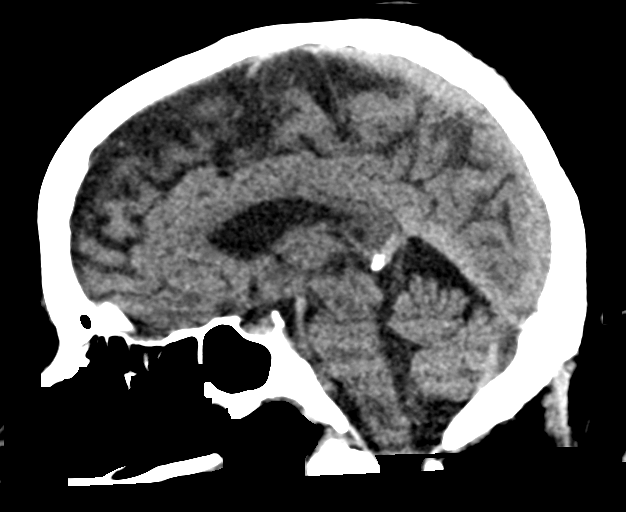
[im 38/64  brain]
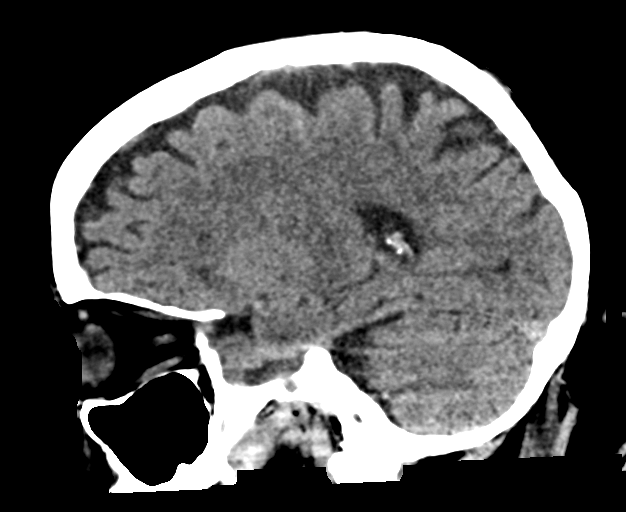

[17 of 47 positions shown; findings below may reference images not displayed]

FINDINGS: Brain: No evidence of acute infarction, hemorrhage, hydrocephalus,
extra-axial collection or mass lesion/mass effect.

Vascular: No hyperdense vessel or unexpected calcification.

Skull: Normal. Negative for fracture or focal lesion.

Sinuses/Orbits: No acute finding.

Other: None.
IMPRESSION: No acute intracranial abnormality.

## 2021-08-12 IMAGING — MR MR LUMBAR SPINE W/O CM
3 series · 27 of 48 positions shown · non-contrast
Comparison: No prior MRI, correlation is made with CT lumbar spine
[DATE].

CLINICAL DATA: Low back pain, weakness and fatigue

EXAM:
MRI LUMBAR SPINE WITHOUT CONTRAST
TECHNIQUE: Multiplanar, multisequence MR imaging of the lumbar spine was
performed. No intravenous contrast was administered.

[Series 5: T2 · sagittal · 4.0mm · 0.81mm/px · 10 of 17 slices shown]
[im 2/17]
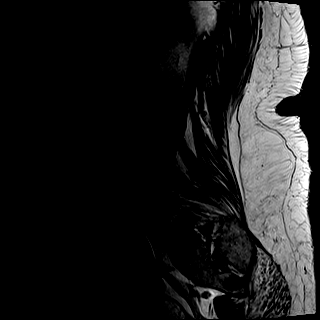
[im 3/17]
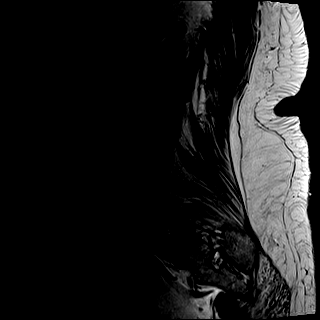
[im 4/17]
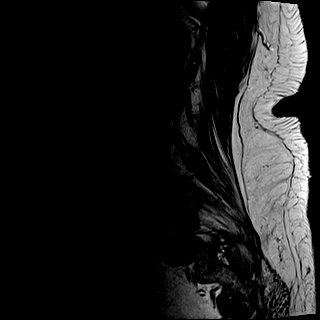
[im 6/17]
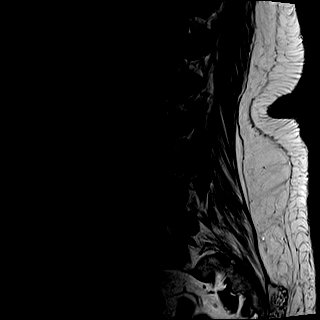
[im 8/17]
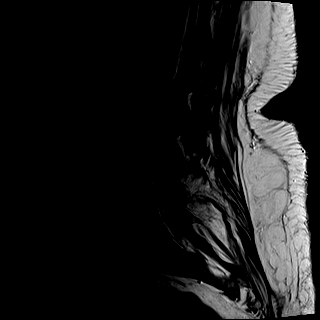
[im 9/17]
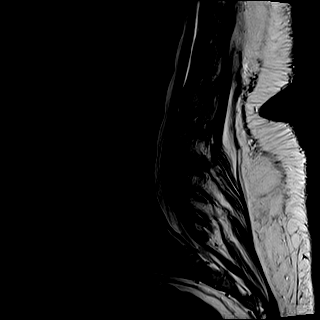
[im 10/17]
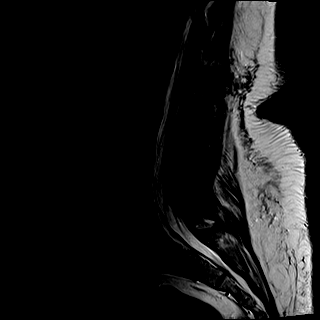
[im 12/17]
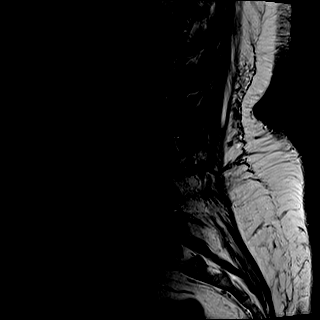
[im 14/17]
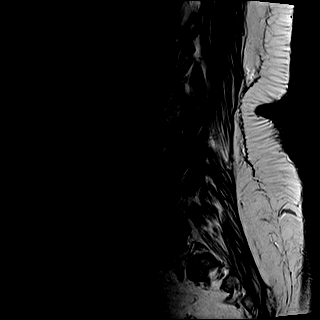
[im 17/17]
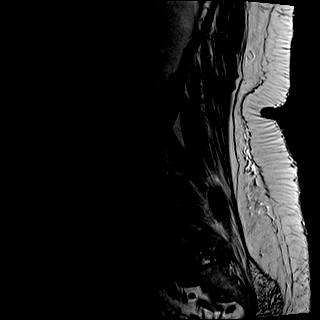

[Series 14: T1 · sagittal · 4.0mm · 0.81mm/px · 10 of 17 slices shown]
[im 2/17]
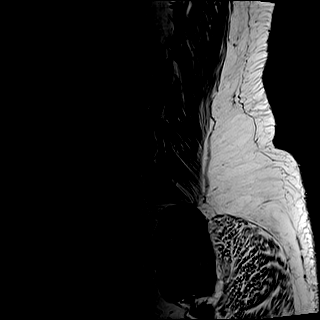
[im 3/17]
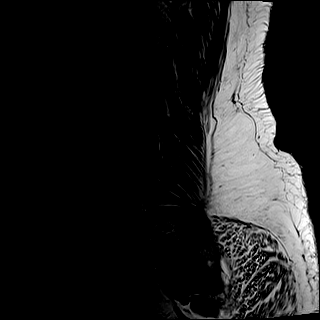
[im 4/17]
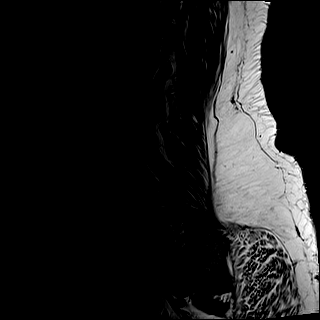
[im 6/17]
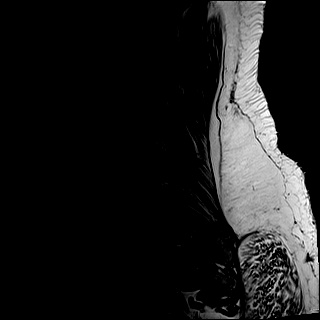
[im 8/17]
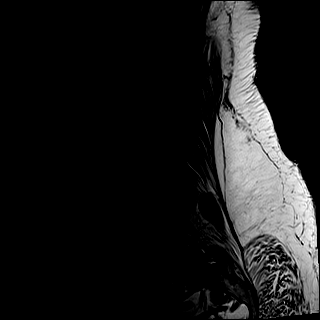
[im 9/17]
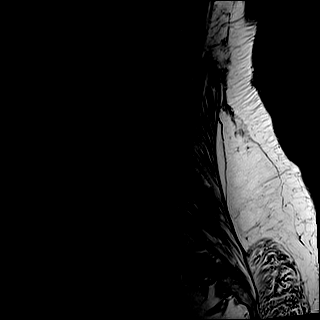
[im 10/17]
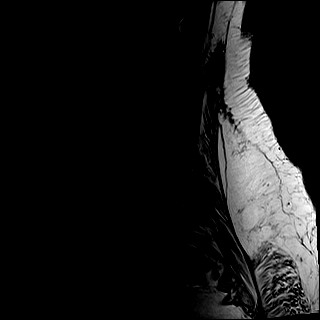
[im 12/17]
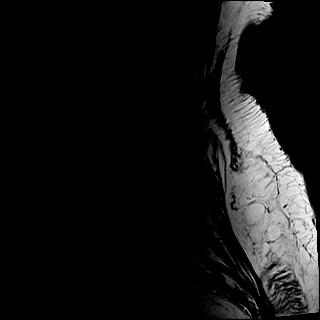
[im 14/17]
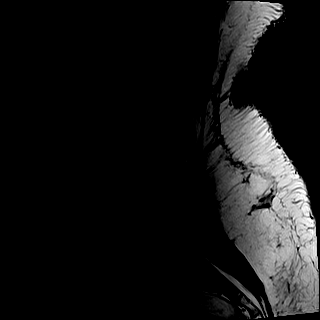
[im 17/17]
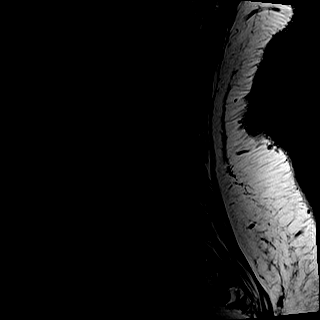

[Series 17: STIR · oblique · 4.0mm · 0.41mm/px · 7 of 17 slices shown]
[im 2/17]
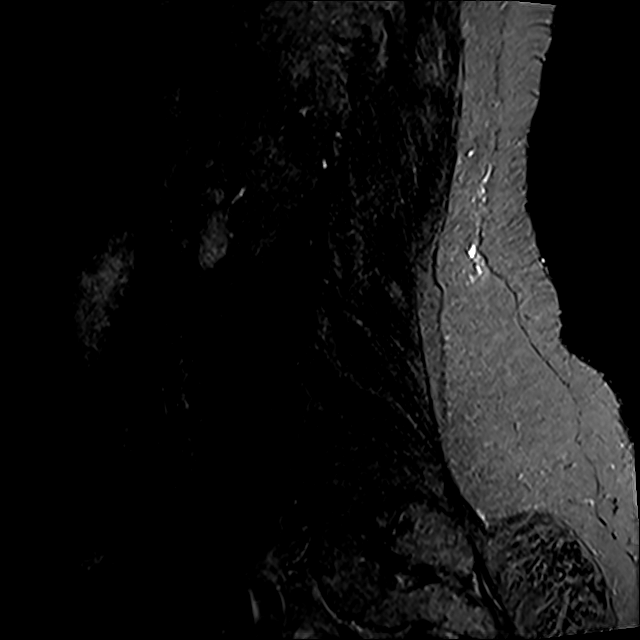
[im 3/17]
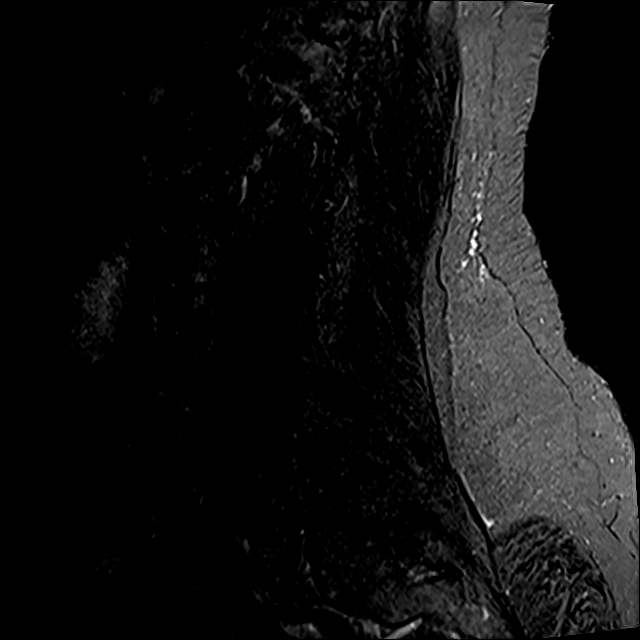
[im 4/17]
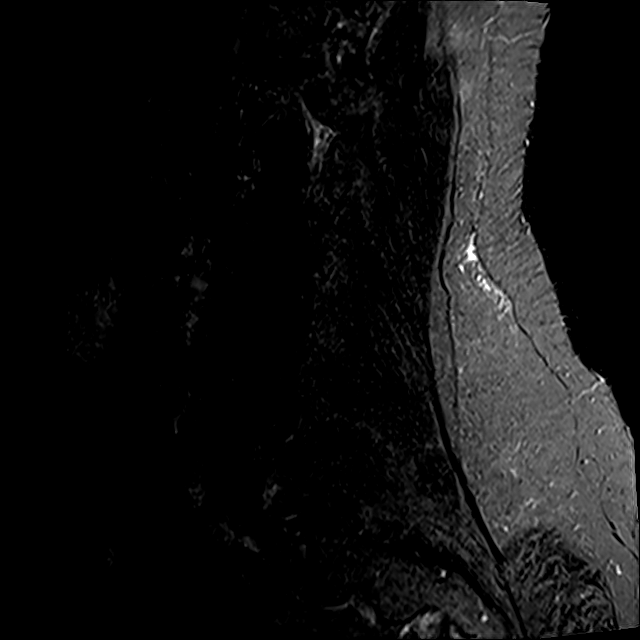
[im 6/17]
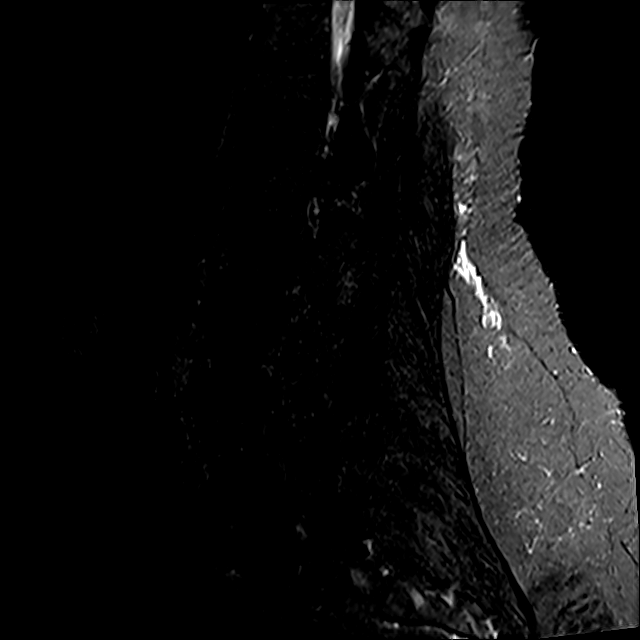
[im 8/17]
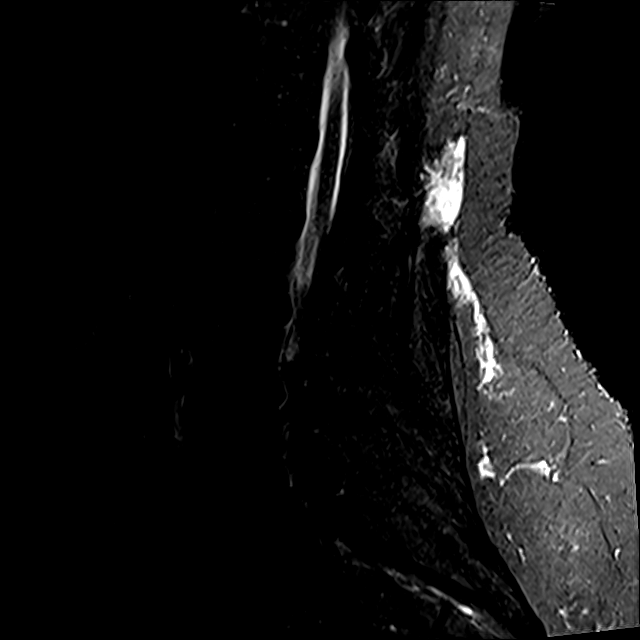
[im 9/17]
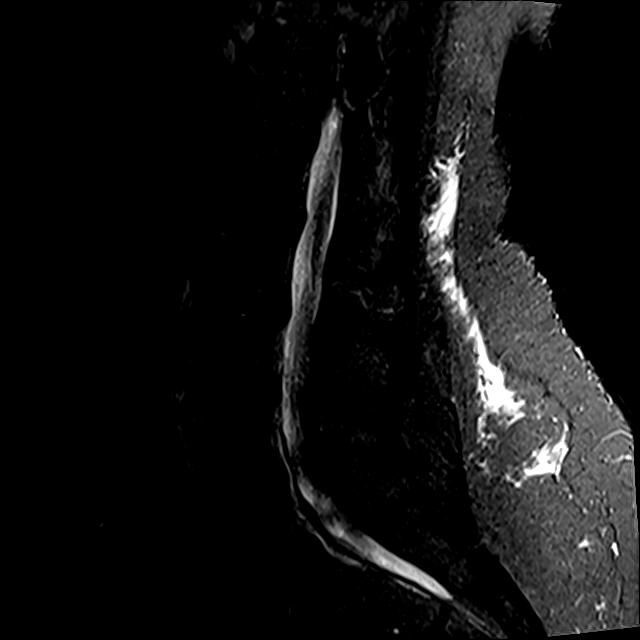
[im 14/17]
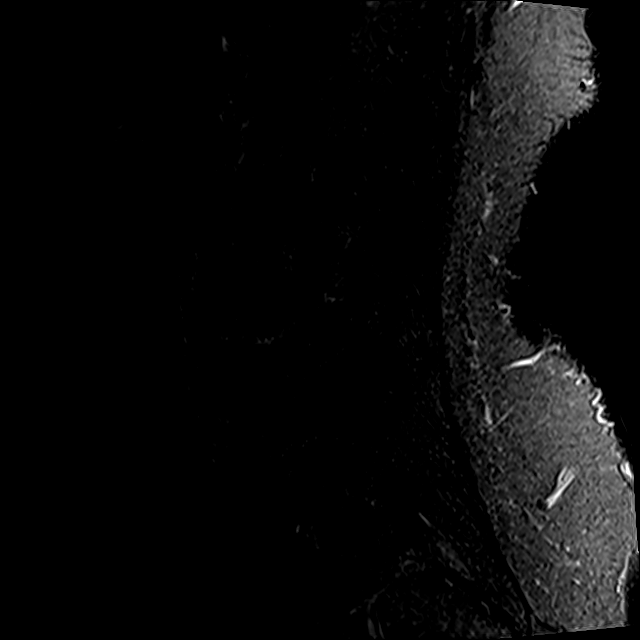

[27 of 48 positions shown; findings below may reference images not displayed]

FINDINGS: Evaluation is limited, as patient would not complete axial
sequences.

Segmentation: Standard.

Alignment:  No listhesis.

Vertebrae:  No acute fracture or suspicious osseous lesion.

Conus medullaris and cauda equina: Conus extends to the L2 level.
Conus and cauda equina appear normal.

Paraspinal and other soft tissues: Negative

Disc levels:

Evaluation is limited by sagittal only imaging. Within this
limitation, mild multilevel degenerative changes, with disc
desiccation and small disc bulges, but no significant spinal canal
stenosis or neural foraminal narrowing.
IMPRESSION: Evaluation is limited as the patient would not complete axial
sequences. Within this limitation, no significant spinal canal
stenosis or neural foraminal narrowing.

## 2021-08-12 MED ORDER — INSULIN ASPART 100 UNIT/ML IJ SOLN
0.0000 [IU] | Freq: Three times a day (TID) | INTRAMUSCULAR | Status: DC
  Administered 2021-08-12: 18:00:00 5 [IU] via SUBCUTANEOUS
  Administered 2021-08-13: 19:00:00 3 [IU] via SUBCUTANEOUS
  Administered 2021-08-13 – 2021-08-14 (×3): 5 [IU] via SUBCUTANEOUS
  Administered 2021-08-14: 11:00:00 3 [IU] via SUBCUTANEOUS
  Administered 2021-08-15: 09:00:00 2 [IU] via SUBCUTANEOUS
  Administered 2021-08-15: 20:00:00 5 [IU] via SUBCUTANEOUS
  Administered 2021-08-15 – 2021-08-16 (×2): 3 [IU] via SUBCUTANEOUS
  Administered 2021-08-16: 17:00:00 8 [IU] via SUBCUTANEOUS
  Administered 2021-08-16 – 2021-08-18 (×5): 3 [IU] via SUBCUTANEOUS
  Filled 2021-08-12 (×16): qty 1

## 2021-08-12 MED ORDER — LORAZEPAM 2 MG/ML IJ SOLN
1.0000 mg | Freq: Once | INTRAMUSCULAR | Status: AC | PRN
  Administered 2021-08-12: 22:00:00 1 mg via INTRAVENOUS
  Filled 2021-08-12: qty 1

## 2021-08-12 MED ORDER — DULOXETINE HCL 30 MG PO CPEP
60.0000 mg | ORAL_CAPSULE | Freq: Every day | ORAL | Status: DC
  Administered 2021-08-12 – 2021-08-14 (×2): 60 mg via ORAL
  Filled 2021-08-12 (×3): qty 2

## 2021-08-12 MED ORDER — CEFEPIME HCL 2 G IJ SOLR
2.0000 g | Freq: Once | INTRAMUSCULAR | Status: AC
  Administered 2021-08-12: 2 g via INTRAVENOUS
  Filled 2021-08-12: qty 2

## 2021-08-12 MED ORDER — DONEPEZIL HCL 5 MG PO TABS
10.0000 mg | ORAL_TABLET | Freq: Every day | ORAL | Status: DC
  Administered 2021-08-12 – 2021-08-17 (×6): 10 mg via ORAL
  Filled 2021-08-12 (×6): qty 2

## 2021-08-12 MED ORDER — METRONIDAZOLE 500 MG/100ML IV SOLN
500.0000 mg | Freq: Once | INTRAVENOUS | Status: AC
  Administered 2021-08-13: 500 mg via INTRAVENOUS
  Filled 2021-08-12: qty 100

## 2021-08-12 MED ORDER — SODIUM CHLORIDE 0.9 % IV BOLUS
1000.0000 mL | Freq: Once | INTRAVENOUS | Status: AC
  Administered 2021-08-12: 1000 mL via INTRAVENOUS

## 2021-08-12 MED ORDER — MIRABEGRON ER 25 MG PO TB24: 25.0000 mg | ORAL_TABLET | Freq: Every day | ORAL | Status: DC

## 2021-08-12 MED ORDER — SODIUM CHLORIDE 0.9 % IV SOLN: INTRAVENOUS | Status: DC

## 2021-08-12 MED ORDER — INSULIN GLARGINE-YFGN 100 UNIT/ML ~~LOC~~ SOLN
30.0000 [IU] | Freq: Every day | SUBCUTANEOUS | Status: DC
  Administered 2021-08-12 – 2021-08-16 (×5): 30 [IU] via SUBCUTANEOUS
  Filled 2021-08-12 (×6): qty 0.3

## 2021-08-12 MED ORDER — METRONIDAZOLE 500 MG/100ML IV SOLN
500.0000 mg | Freq: Two times a day (BID) | INTRAVENOUS | Status: DC
  Administered 2021-08-13 – 2021-08-14 (×2): 500 mg via INTRAVENOUS
  Filled 2021-08-12 (×4): qty 100

## 2021-08-12 MED ORDER — ENOXAPARIN SODIUM 60 MG/0.6ML IJ SOSY
55.0000 mg | PREFILLED_SYRINGE | Freq: Every day | INTRAMUSCULAR | Status: DC
  Administered 2021-08-12 – 2021-08-17 (×6): 55 mg via SUBCUTANEOUS
  Filled 2021-08-12 (×6): qty 0.6

## 2021-08-12 MED ORDER — ONDANSETRON 4 MG PO TBDP
4.0000 mg | ORAL_TABLET | Freq: Three times a day (TID) | ORAL | Status: DC | PRN
  Administered 2021-08-15: 22:00:00 4 mg via ORAL
  Filled 2021-08-12 (×2): qty 1

## 2021-08-12 MED ORDER — QUETIAPINE FUMARATE 25 MG PO TABS
25.0000 mg | ORAL_TABLET | Freq: Every day | ORAL | Status: DC
  Administered 2021-08-12: 25 mg via ORAL
  Filled 2021-08-12: qty 1

## 2021-08-12 MED ORDER — INSULIN ASPART 100 UNIT/ML IJ SOLN
0.0000 [IU] | Freq: Every day | INTRAMUSCULAR | Status: DC
  Administered 2021-08-12: 22:00:00 3 [IU] via SUBCUTANEOUS
  Administered 2021-08-16: 23:00:00 2 [IU] via SUBCUTANEOUS
  Filled 2021-08-12 (×2): qty 1

## 2021-08-12 MED ORDER — SIMVASTATIN 20 MG PO TABS
40.0000 mg | ORAL_TABLET | Freq: Every day | ORAL | Status: DC
  Administered 2021-08-12 – 2021-08-17 (×6): 40 mg via ORAL
  Filled 2021-08-12 (×6): qty 2

## 2021-08-12 MED ORDER — FAMOTIDINE 20 MG PO TABS
20.0000 mg | ORAL_TABLET | Freq: Every day | ORAL | Status: DC
  Administered 2021-08-12 – 2021-08-17 (×6): 20 mg via ORAL
  Filled 2021-08-12 (×6): qty 1

## 2021-08-12 MED ORDER — ASPIRIN EC 81 MG PO TBEC
81.0000 mg | DELAYED_RELEASE_TABLET | Freq: Every day | ORAL | Status: DC
  Administered 2021-08-12 – 2021-08-18 (×6): 81 mg via ORAL
  Filled 2021-08-12 (×7): qty 1

## 2021-08-12 MED ORDER — SODIUM CHLORIDE 0.9 % IV SOLN
2.0000 g | Freq: Two times a day (BID) | INTRAVENOUS | Status: DC
  Administered 2021-08-13 – 2021-08-14 (×3): 2 g via INTRAVENOUS
  Filled 2021-08-12 (×5): qty 2

## 2021-08-12 MED ORDER — DIAZEPAM 5 MG/ML IJ SOLN: 2.5000 mg | Freq: Once | INTRAMUSCULAR | Status: DC

## 2021-08-12 NOTE — Progress Notes (Signed)
OT Cancellation Note  Patient Details Name: Judith Shaw MRN: [id number] DOB: Mar 18, 1951   Cancelled Treatment:    Reason Eval/Treat Not Completed: Patient at procedure or test/ unavailable. Consult received, chart reviewed. Pt off the floor. Also pending stat lumbar spine imaging. Will re-attempt OT evaluation next date as appropriate.   Ardeth Perfect., MPH, MS, OTR/L [place] [phone] 08/12/21, 4:02 PM

## 2021-08-12 NOTE — Progress Notes (Signed)
PHARMACY -  BRIEF ANTIBIOTIC NOTE   Pharmacy has received consult(s) for cefepime from an ED provider.  The patient's profile has been reviewed for ht/wt/allergies/indication/available labs.    One time order(s) placed for cefepime 2 g  Further antibiotics/pharmacy consults should be ordered by admitting physician if indicated.                       Thank you, Judith Shaw 08/12/2021  12:17 PM

## 2021-08-12 NOTE — TOC Initial Note (Signed)
Transition of Care [facility]) - Initial/Assessment Note    Patient Details  Name: Judith Shaw MRN: [id number] Date of Birth: [birth year]  Transition of Care [facility]) CM/SW Contact:    Shelbie Hutching, RN Phone Number: 08/12/2021, 5:46 PM  Clinical Narrative:                 Patient admitted to the hospital acute cystitis and pneumonia.  Patient came in from [facility] where she was staying for rehab.  Patient's sister, [name], legal guardian and HCPOA reports that patient was living at home with caregivers before she was admitted for urosepsis on 11/30.  [name] would like for patient to return to rehab at discharge but she prefers [facility].  TOC will follow through hospitalization and assist with disposition.     Expected Discharge Plan: Skilled Nursing Facility Barriers to Discharge: Continued Medical Work up   Patient Goals and CMS Choice Patient states their goals for this hospitalization and ongoing recovery are:: Plan for patient to return to short term rehab, sister prefers [employer].gov Compare Post Acute Care list provided to:: Legal Guardian Choice offered to / list presented to : [facility] POA / Guardian  Expected Discharge Plan and Services Expected Discharge Plan: [place]   Discharge Planning Services: CM Consult Post Acute Care Choice: [place] Living arrangements for the past 2 months: Single Family Home                 DME Arranged: N/A DME Agency: NA       HH Arranged: NA HH Agency: NA        Prior Living Arrangements/Services Living arrangements for the past 2 months: Single Family Home Lives with:: Self Patient language and need for interpreter reviewed:: Yes Do you feel safe going back to the place where you live?: Yes      Need for Family Participation in Patient Care: Yes (Comment) Care giver support system in place?: Yes (comment) Current home services: DME, Homehealth aide (walker) Criminal Activity/Legal  Involvement Pertinent to Current Situation/Hospitalization: No - Comment as needed  Activities of Daily Living      Permission Sought/Granted Permission sought to share information with : Case Manager, Family Supports, Customer service manager Permission granted to share information with : Yes, Verbal Permission Granted  Share Information with NAME: [name]  Permission granted to share info w AGENCY: [place], [facility], [place] granted to share info w Relationship: sister  Permission granted to share info w Contact Information: [phone]  Emotional Assessment       Orientation: : Oriented to Self, Oriented to Situation Alcohol / Substance Use: Not Applicable Psych Involvement: No (comment)  Admission diagnosis:  Lethargic [R53.83] Encephalopathy [G93.40] Acute cystitis with hematuria [N30.01] Acute encephalopathy [G93.40] Aspiration pneumonia of lower lobe, unspecified aspiration pneumonia type, unspecified laterality ([place]) [J69.0] Patient Active Problem List   Diagnosis Date Noted   Acute encephalopathy 31/49/7026   Acute metabolic encephalopathy 37/85/8850   Acute pyelonephritis 07/28/2021   Severe sepsis ([place]) 07/28/2021   Severe sepsis with acute organ dysfunction ([place]) 07/27/2021   Atherosclerosis of native arteries of the extremities with ulceration ([place]) 02/24/2020   S/P amputation of lesser toe, left ([place]) 01/13/2020   Dementia without behavioral disturbance ([place]) 12/16/2019   Acute osteomyelitis of left foot ([place]) 12/15/2019   Memory impairment 11/30/2019   Complaints of memory disturbance 11/20/2019   Diabetic ulcer of left midfoot associated with type 2 diabetes mellitus (  [place]) 11/18/2019   Seizure-like activity ([place]) 07/15/2019   Intractable 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 06/19/2019   Uncontrolled type 2 diabetes mellitus with hyperglycemia, with long-term current use of insulin ([place]) 04/22/2019    Panic attacks 03/26/2019   Benign essential HTN 07/19/2018   Fatty liver 05/15/2018   Hx of adenomatous colonic polyps 05/15/2018   Type 2 diabetes mellitus with diabetic nephropathy, with long-term current use of insulin ([place]) 05/01/2018   Tinnitus, bilateral 04/05/2017   Concussion syndrome 04/03/2017   Concussion without loss of consciousness 01/24/2017   Difficulty walking 01/24/2017   Headache disorder 01/24/2017   Numbness and tingling 01/24/2017   Postural urinary incontinence 01/24/2017   Sepsis ([place]) 09/21/2016   UTI (urinary tract infection) 09/21/2016   HTN (hypertension) 09/21/2016   Diabetes ([place]) 09/21/2016   Depression 09/21/2016   Health care maintenance 10/05/2015   Recurrent major depressive disorder, in full remission ([place]) 06/16/2014   DM (diabetes mellitus) type II controlled, neurological manifestation ([place]) 06/12/2014   Morbid obesity ([place]) 06/12/2014   Hyperlipidemia 03/10/2014   Chronic diastolic CHF (congestive heart failure) ([place]) 59/56/3875   H/O diastolic dysfunction 64/33/2951   Sleep apnea 03/10/2014   SOB (shortness of breath) 03/10/2014   PCP:  Pcp, No Pharmacy:   [facility] [address] [place] [address] Phone: [phone] Fax: [phone]     Social Determinants of Health (SDOH) Interventions    Readmission Risk Interventions Readmission Risk Prevention Plan 08/12/2021  Transportation Screening Complete  PCP or Specialist Appt within 3-5 Days Complete  HRI or Home Care Consult Complete  Social Work Consult for [place] Planning/Counseling Complete  Palliative Care Screening Not Applicable  Medication Review Press photographer) Complete  Some recent data might be hidden

## 2021-08-12 NOTE — ED Triage Notes (Signed)
Pt noted to be lethargic on initial assessment. Pt awakens and answers questions appropriately and then goes back to sleep. Pt denies any pain at this time. Pt's initial RA sat 82%, placed on 2L via [place] at this time.

## 2021-08-12 NOTE — ED Notes (Signed)
Pt is alert, oriented to self and situation only

## 2021-08-12 NOTE — ED Notes (Signed)
Lexie RN aware of assigned bed

## 2021-08-12 NOTE — ED Notes (Signed)
Lab at bedside

## 2021-08-12 NOTE — Progress Notes (Signed)
Pharmacy Antibiotic Note  Judith Shaw is a 70 y.o. female admitted on 08/12/2021. Pharmacy has been consulted for cefepime dosing. Pt had previous urine culture (07/27/21) with Pseudomonas aeruginosa.   Plan: Cefepime 2 g IV q12h Monitor renal function and adjust dose as clinically indicated Follow up cultures  Weight: 114 kg (251 lb 5.2 oz)  Temp (24hrs), Avg:98 F (36.7 C), Min:98 F (36.7 C), Max:98 F (36.7 C)  Recent Labs  Lab 08/12/21 0906 08/12/21 1029  WBC 8.1  --   CREATININE  --  1.57*    Estimated Creatinine Clearance: 41.4 mL/min (A) (by C-G formula based on SCr of 1.57 mg/dL (H)).    Allergies  Allergen Reactions   Codeine     Antimicrobials this admission: 12/16 metronidazole >>  12/16 cefepime >>    Thank you for allowing pharmacy to be a part of this patients care.  Forde Dandy Kristeen Lantz 08/12/2021 1:51 PM

## 2021-08-12 NOTE — H&P (Signed)
History and Physical    Judith Shaw ZWC:[id number] DOB: [birth year] DOA: 08/12/2021  PCP: Pcp, No  Patient coming from: snf   Chief Complaint: hypoxia, hyperkalemia, fatigue  HPI: Judith Shaw is a 70 y.o. female with medical history significant for morbid obesity, htn, dm, dchf, osa not on cpap, mild dementia, who presents with above.  Sister reports gradual decline worse past 6 months. Recently admitted for uti and discharged to snf. Sister reports several days of weakness/fatigue. Yesterday labs at snf reportedly showed potassium of 6. Her po has not been good. She was given kayexalate. Sister thinks may choke on foot. No fevers or cough. No abdominal or other pain. Denies dysuria. No vomiting or diarrhea. Does have chronic low back pain. Has not been able to do much with rehab.  ED Course:   Fluids, abx, labs, imaging. O2 82% improved to normal w/ 2 L  Review of Systems: As per HPI otherwise 10 point review of systems negative.    Past Medical History:  Diagnosis Date   Anxiety    Cancer ([place])    pt states hx of uterine cancer and had a complete hyst   CHF (congestive heart failure) ([place])    Depression    Diabetes mellitus without complication ([facility])    Hyperlipidemia    Hypertension     Past Surgical History:  Procedure Laterality Date   ABDOMINAL HYSTERECTOMY     AMPUTATION Left 12/19/2019   Procedure: AMPUTATION RAY Left 5th;  Surgeon: Caroline More, DPM;  Location: [facility] ORS;  Service: Podiatry;  Laterality: Left;   CHOLECYSTECTOMY     LOWER EXTREMITY ANGIOGRAPHY Left 12/17/2019   Procedure: Lower Extremity Angiography;  Surgeon: Algernon Huxley, MD;  Location: [facility];  Service: Cardiovascular;  Laterality: Left;     reports that she has never smoked. She has never used smokeless tobacco. She reports that she does not drink alcohol and does not use drugs.  Allergies  Allergen Reactions   Codeine     Family History  Problem Relation Age of Onset    Hypertension Mother    Heart disease Father    Prostate cancer Brother    Diabetes Maternal Aunt    Kidney cancer Neg Hx    Bladder Cancer Neg Hx     Prior to Admission medications   Medication Sig Start Date End Date Taking? Authorizing Provider  aspirin EC 81 MG tablet Take 81 mg by mouth daily.   Yes [provider]  donepezil (ARICEPT) 10 MG tablet Take 10 mg by mouth at bedtime. 06/14/21  Yes [provider]  DULoxetine (CYMBALTA) 60 MG capsule Take 60 mg by mouth daily. 12/24/19  Yes [provider]  famotidine (PEPCID) 20 MG tablet Take 1 tablet (20 mg total) by mouth 2 (two) times daily. 07/18/21  Yes Carrie Mew, MD  gabapentin (NEURONTIN) 300 MG capsule Take 300 mg by mouth at bedtime.   Yes [provider]  gentian violet 2 % topical solution Apply 0.5 mLs topically in the morning and at bedtime. Apply between the toes twice daily. 10/20/19  Yes [provider]  Insulin Aspart FlexPen (NOVOLOG) 100 UNIT/ML Inject 0-15 Units into the skin 3 (three) times daily with meals. 05/04/21  Yes [provider]  mirabegron ER (MYRBETRIQ) 25 MG TB24 tablet Take 1 tablet (25 mg total) by mouth daily. 07/01/19  Yes McGowan, Larene Beach A, PA-C  QUEtiapine (SEROQUEL) 25 MG tablet Take 1 tablet (25 mg total)  by mouth at bedtime. 08/02/21  Yes Sharen Hones, MD  simvastatin (ZOCOR) 40 MG tablet Take 40 mg by mouth every morning.    Yes [provider]  TRESIBA FLEXTOUCH 200 UNIT/ML FlexTouch Pen Inject 32 Units into the skin every evening. Between 7-9 pm 08/02/21  Yes Sharen Hones, MD  TRULICITY 4.5 JH/4.1DE SOPN Inject 4.5 mg into the skin once a week. 07/26/21  Yes [provider]  acetaminophen (TYLENOL) 325 MG tablet Take 2 tablets (650 mg total) by mouth every 6 (six) hours as needed. 07/18/21   Carrie Mew, MD  aluminum-magnesium hydroxide-simethicone (MAALOX) 200-200-20 MG/5ML SUSP Take 30 mLs by mouth 4 (four) times  daily -  before meals and at bedtime. 07/18/21   Carrie Mew, MD  clotrimazole-betamethasone (LOTRISONE) cream Apply 1 application topically 2 (two) times daily.    [provider]  furosemide (LASIX) 20 MG tablet Take 20 mg by mouth every other day.  11/29/16   [provider]  ondansetron (ZOFRAN ODT) 4 MG disintegrating tablet Take 1 tablet (4 mg total) by mouth every 8 (eight) hours as needed for nausea or vomiting. 07/18/21   Carrie Mew, MD  potassium chloride (KLOR-CON) 10 MEQ tablet Take 10 mEq by mouth daily. Patient not taking: Reported on 08/12/2021 02/15/20   [provider]    Physical Exam: Vitals:   08/12/21 0907 08/12/21 1026 08/12/21 1100 08/12/21 1200  BP:  135/86 124/69 (!) 148/66  Pulse:  80 80 76  Resp:  15 16 16   Temp:      TempSrc:      SpO2: 94% 100% 100% 96%    Constitutional: No acute distress, morbidly obese Head: Atraumatic Eyes: Conjunctiva clear ENM: Moist mucous membranes. Normal dentition.  Neck: Supple Respiratory: Clear to auscultation bilaterally, no wheezing/rales/rhonchi. Normal respiratory effort. No accessory muscle use. . Cardiovascular: Regular rate and rhythm. No murmurs/rubs/gallops. Abdomen: Non-tender, non-distended. Obese. No masses. No rebound or guarding. Positive bowel sounds. Musculoskeletal: No joint deformity upper and lower extremities. Normal ROM, no contractures. Normal muscle tone.  Skin: erythematous macule stomach with scale Extremities: No peripheral edema. Palpable peripheral pulses. Neurologic: asleep but arouses and then alert, oriented to self and year, answers questions. Moves all 4 ext, strength symmetric Psychiatric: Normal insight and judgement.   Labs on Admission: I have personally reviewed following labs and imaging studies  CBC: Recent Labs  Lab 08/12/21 0906  WBC 8.1  NEUTROABS 4.8  HGB 14.6  HCT 45.7  MCV 89.6  PLT 081   Basic Metabolic Panel: Recent Labs  Lab  08/12/21 1029  NA 136  K 5.3*  CL 105  CO2 23  GLUCOSE 236*  BUN 62*  CREATININE 1.57*  CALCIUM 9.2   GFR: CrCl cannot be calculated (Unknown ideal weight.). Liver Function Tests: Recent Labs  Lab 08/12/21 1029  AST 41  ALT 35  ALKPHOS 116  BILITOT 0.6  PROT 7.3  ALBUMIN 3.1*   No results for input(s): LIPASE, AMYLASE in the last 168 hours. No results for input(s): AMMONIA in the last 168 hours. Coagulation Profile: No results for input(s): INR, PROTIME in the last 168 hours. Cardiac Enzymes: No results for input(s): CKTOTAL, CKMB, CKMBINDEX, TROPONINI in the last 168 hours. BNP (last 3 results) No results for input(s): PROBNP in the last 8760 hours. HbA1C: No results for input(s): HGBA1C in the last 72 hours. CBG: Recent Labs  Lab 08/12/21 0858  GLUCAP 233*   Lipid Profile: No results for input(s): CHOL, HDL,  LDLCALC, TRIG, CHOLHDL, LDLDIRECT in the last 72 hours. Thyroid Function Tests: No results for input(s): TSH, T4TOTAL, FREET4, T3FREE, THYROIDAB in the last 72 hours. Anemia Panel: No results for input(s): VITAMINB12, FOLATE, FERRITIN, TIBC, IRON, RETICCTPCT in the last 72 hours. Urine analysis:    Component Value Date/Time   COLORURINE YELLOW 08/12/2021 1057   APPEARANCEUR HAZY (A) 08/12/2021 1057   APPEARANCEUR Cloudy 05/20/2012 0116   LABSPEC 1.020 08/12/2021 1057   LABSPEC 1.025 05/20/2012 0116   PHURINE 5.0 08/12/2021 1057   GLUCOSEU NEGATIVE 08/12/2021 1057   GLUCOSEU >=500 05/20/2012 0116   HGBUR SMALL (A) 08/12/2021 1057   BILIRUBINUR NEGATIVE 08/12/2021 1057   BILIRUBINUR Negative 05/20/2012 0116   KETONESUR TRACE (A) 08/12/2021 1057   PROTEINUR 30 (A) 08/12/2021 1057   NITRITE NEGATIVE 08/12/2021 1057   LEUKOCYTESUR LARGE (A) 08/12/2021 1057   LEUKOCYTESUR 2+ 05/20/2012 0116    Radiological Exams on Admission: DG Chest 1 View  Result Date: 08/12/2021 CLINICAL DATA:  Lethargic, hypoxia, history of CHF EXAM: CHEST  1 VIEW  COMPARISON:  Chest radiograph 07/27/2021 FINDINGS: The heart is enlarged, similar to the prior study. The upper mediastinal contours are stable. There is vascular congestion without definite overt pulmonary interstitial edema. There is no focal consolidation. There is no pleural effusion or pneumothorax. There is no acute osseous abnormality. IMPRESSION: Cardiomegaly with vascular congestion but no definite overt pulmonary edema. Electronically Signed   By: Valetta Mole M.D.   On: 08/12/2021 09:40   CT HEAD WO CONTRAST (5MM)  Result Date: 08/12/2021 CLINICAL DATA:  Mental status change, unknown cause. EXAM: CT HEAD WITHOUT CONTRAST TECHNIQUE: Contiguous axial images were obtained from the base of the skull through the vertex without intravenous contrast. COMPARISON:  07/27/2021 FINDINGS: Brain: No evidence of acute infarction, hemorrhage, hydrocephalus, extra-axial collection or mass lesion/mass effect. Vascular: No hyperdense vessel or unexpected calcification. Skull: Normal. Negative for fracture or focal lesion. Sinuses/Orbits: No acute finding. Other: None. IMPRESSION: No acute intracranial abnormality. Electronically Signed   By: Markus Daft M.D.   On: 08/12/2021 11:45   CT Chest Wo Contrast  Result Date: 08/12/2021 CLINICAL DATA:  Respiratory illness, nondiagnostic x-ray. EXAM: CT CHEST WITHOUT CONTRAST TECHNIQUE: Multidetector CT imaging of the chest was performed following the standard protocol without IV contrast. COMPARISON:  07/27/2021 FINDINGS: Cardiovascular: Heart size is normal without significant pericardial fluid. Normal caliber of the thoracic aorta. Mediastinum/Nodes: No evidence for chest lymphadenopathy. Esophagus is unremarkable. No significant axillary lymph node enlargement. Thyroid tissue is unremarkable. Lungs/Pleura: Trachea and mainstem bronchi are patent. No pleural effusions. Increased consolidation in the posterior lower lobes bilaterally. Again noted are hazy densities in the  lingula. Upper Abdomen: Cholecystectomy. Again noted is cortical scarring in the left kidney that is only partially imaged. No acute abnormality in the visualized upper abdomen. Musculoskeletal: No acute bone abnormality. IMPRESSION: Increased patchy densities in the posterior lower lobes compared to the previous chest CT. Findings could be related to volume loss but cannot exclude areas of infection. Electronically Signed   By: Markus Daft M.D.   On: 08/12/2021 11:54    EKG: Independently reviewed. nsr  Assessment/Plan Principal Problem:   Acute encephalopathy Active Problems:   HTN (hypertension)   Diabetes ([place])   Morbid obesity ([place])   # Acute hypoxic respiratory failure # Possible aspiration pneumonia Not on home o2 but morbid obesity and untreated osa likely contribute. CT of chest shows likely atelectasis but question aspiration. Breathing comfortably on 2 L currently. No infectious  symptoms. Does not appear to be fluid overloaded - continue [place] o2, wean as able - f/u dimer, consider PE w/u - cefepime/flagyl - f/u mrsa screen - f/u covid/flu - slp eval  # Possible acute cystitis Recently admitted for pseudomonas uti. Here urinalysis equivocal. No cystitis symptoms but does have AMS - continue cefepime - f/u culture  # Debility Coming from snf, apparently has not been able to engage much with pt there - pt/ot consults  # Hyperkalemia Likely 2/2 aki. Mild, k 5.3 - continue IVF, monitor - tele  # Acute kidney injury Baseline kidney function is variable but frequently around 1, here cr 1.57 likely 2/2 reduced po - IVF as above, further w/u if does not respond to hydration - f/u ck  # T2DM Here hyperglycemic to 200s - hole home meds - semglee 30, SSI  # Low back pain With worsening ambulation last 6 weeks or so. Able to move both lower extremities, no cauda equina symptoms. Had ct of lumbar spine in September that showed degenerative changes - given report of  significant worsening of lower extremity weakness will check MRI of lumbar spine to r/o cord compression  # OSA Refuses cpap  # Chronic pain - home duloxetine - hold home gabapentin given mild somnolence  # MCI - cont home donepezil, seroquel  # HTN Here bp wnl - home aspirin, statin  # overactive bladder - hold myrbetriq given concern for recurrent uti  # hfpef Appears compensated - hold home lasix given aki   DVT prophylaxis: lovenox Code Status: full  Family Communication: sister updated @ bedside  Consults called: none   Level of care: Telemetry Medical Status is: Inpatient  Remains inpatient appropriate because: severity of illness        Desma Maxim MD [facility] Pager [phone]  If 7PM-7AM, please contact night-coverage [email or web address] Password [email or web address]  08/12/2021, 1:41 PM

## 2021-08-12 NOTE — ED Notes (Signed)
IV team with pt.

## 2021-08-12 NOTE — ED Provider Notes (Signed)
[facility] Emergency Department Provider Note  ____________________________________________   Event Date/Time   First MD Initiated Contact with Patient 08/12/21 1010     (approximate)  I have reviewed the triage vital signs and the nursing notes.   HISTORY  Chief Complaint Altered Mental Status    HPI Judith Shaw is a 70 y.o. female  with h/o HTN, HLD, DM, CHF< here with AMS, weakness. History provided primarily by sister who is Economist, retired Therapist, sports. Pt has reportedly been at [facility] following recent hospitalization for UTI, had been slightly improving until the last 24 hours or so.  Over the last 24 hours, she has become less responsive and been drowsy.  She is normally fairly active and fidgety, but has been essentially sleeping for the last day.  Patient has had a mild increased cough.  Patient has also been intermittently hypotensive at her facility.  She is not been eating and drinking well.  She reportedly has had some possible aspiration events as well.  On my assessment, the patient is able to tell me that she is at hospital as well as the year.  Denies any pain complaints.  Remainder of history limited due to mild dementia with likely acute encephalopathy.      Past Medical History:  Diagnosis Date   Anxiety    Cancer ([place])    pt states hx of uterine cancer and had a complete hyst   CHF (congestive heart failure) ([place])    Depression    Diabetes mellitus without complication ([place])    Hyperlipidemia    Hypertension     Patient Active Problem List   Diagnosis Date Noted   Acute metabolic encephalopathy 60/73/7106   Acute pyelonephritis 07/28/2021   Severe sepsis ([place]) 07/28/2021   Severe sepsis with acute organ dysfunction ([place]) 07/27/2021   Atherosclerosis of native arteries of the extremities with ulceration ([place]) 02/24/2020   S/P amputation of lesser toe, left ([place]) 01/13/2020   Dementia without behavioral disturbance ([place]) 12/16/2019   Acute  osteomyelitis of left foot ([place]) 12/15/2019   Memory impairment 11/30/2019   Complaints of memory disturbance 11/20/2019   Diabetic ulcer of left midfoot associated with type 2 diabetes mellitus ([place]) 11/18/2019   Seizure-like activity ([place]) 07/15/2019   Intractable 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 06/19/2019   Uncontrolled type 2 diabetes mellitus with hyperglycemia, with long-term current use of insulin ([place]) 04/22/2019   Panic attacks 03/26/2019   Benign essential HTN 07/19/2018   Fatty liver 05/15/2018   Hx of adenomatous colonic polyps 05/15/2018   Type 2 diabetes mellitus with diabetic nephropathy, with long-term current use of insulin ([place]) 05/01/2018   Tinnitus, bilateral 04/05/2017   Concussion syndrome 04/03/2017   Concussion without loss of consciousness 01/24/2017   Difficulty walking 01/24/2017   Headache disorder 01/24/2017   Numbness and tingling 01/24/2017   Postural urinary incontinence 01/24/2017   Sepsis ([place]) 09/21/2016   UTI (urinary tract infection) 09/21/2016   HTN (hypertension) 09/21/2016   Diabetes ([place]) 09/21/2016   Depression 09/21/2016   Health care maintenance 10/05/2015   Recurrent major depressive disorder, in full remission ([place]) 06/16/2014   DM (diabetes mellitus) type II controlled, neurological manifestation ([place]) 06/12/2014   Morbid obesity ([place]) 06/12/2014   Hyperlipidemia 03/10/2014   Chronic diastolic CHF (congestive heart failure) ([place]) 26/94/8546   H/O diastolic dysfunction 27/10/5007   Sleep apnea 03/10/2014   SOB (shortness of breath) 03/10/2014    Past Surgical History:  Procedure Laterality  Date   ABDOMINAL HYSTERECTOMY     AMPUTATION Left 12/19/2019   Procedure: AMPUTATION RAY Left 5th;  Surgeon: Caroline More, DPM;  Location: [facility] ORS;  Service: Podiatry;  Laterality: Left;   CHOLECYSTECTOMY     LOWER EXTREMITY ANGIOGRAPHY Left 12/17/2019   Procedure: Lower Extremity Angiography;  Surgeon: Algernon Huxley, MD;  Location: [place] [facility];  Service: Cardiovascular;  Laterality: Left;    Prior to Admission medications   Medication Sig Start Date End Date Taking? Authorizing Provider  aspirin EC 81 MG tablet Take 81 mg by mouth daily.   Yes [provider]  donepezil (ARICEPT) 10 MG tablet Take 10 mg by mouth at bedtime. 06/14/21  Yes [provider]  DULoxetine (CYMBALTA) 60 MG capsule Take 60 mg by mouth daily. 12/24/19  Yes [provider]  famotidine (PEPCID) 20 MG tablet Take 1 tablet (20 mg total) by mouth 2 (two) times daily. 07/18/21  Yes Carrie Mew, MD  gabapentin (NEURONTIN) 300 MG capsule Take 300 mg by mouth at bedtime.   Yes [provider]  gentian violet 2 % topical solution Apply 0.5 mLs topically in the morning and at bedtime. Apply between the toes twice daily. 10/20/19  Yes [provider]  Insulin Aspart FlexPen (NOVOLOG) 100 UNIT/ML Inject 0-15 Units into the skin 3 (three) times daily with meals. 05/04/21  Yes [provider]  mirabegron ER (MYRBETRIQ) 25 MG TB24 tablet Take 1 tablet (25 mg total) by mouth daily. 07/01/19  Yes McGowan, Larene Beach A, PA-C  QUEtiapine (SEROQUEL) 25 MG tablet Take 1 tablet (25 mg total) by mouth at bedtime. 08/02/21  Yes Sharen Hones, MD  simvastatin (ZOCOR) 40 MG tablet Take 40 mg by mouth every morning.    Yes [provider]  TRESIBA FLEXTOUCH 200 UNIT/ML FlexTouch Pen Inject 32 Units into the skin every evening. Between 7-9 pm 08/02/21  Yes Sharen Hones, MD  TRULICITY 4.5 ML/4.6TK SOPN Inject 4.5 mg into the skin once a week. 07/26/21  Yes [provider]  acetaminophen (TYLENOL) 325 MG tablet Take 2 tablets (650 mg total) by mouth every 6 (six) hours as needed. 07/18/21   Carrie Mew, MD  aluminum-magnesium hydroxide-simethicone (MAALOX) 200-200-20 MG/5ML SUSP Take 30 mLs by mouth 4 (four) times daily -  before meals and at bedtime. 07/18/21   Carrie Mew, MD   clotrimazole-betamethasone (LOTRISONE) cream Apply 1 application topically 2 (two) times daily.    [provider]  furosemide (LASIX) 20 MG tablet Take 20 mg by mouth every other day.  11/29/16   [provider]  ondansetron (ZOFRAN ODT) 4 MG disintegrating tablet Take 1 tablet (4 mg total) by mouth every 8 (eight) hours as needed for nausea or vomiting. 07/18/21   Carrie Mew, MD  potassium chloride (KLOR-CON) 10 MEQ tablet Take 10 mEq by mouth daily. Patient not taking: Reported on 08/12/2021 02/15/20   [provider]  ramipril (ALTACE) 5 MG capsule Take 5 mg by mouth daily. Patient not taking: Reported on 08/12/2021    [provider]    Allergies Codeine  Family History  Problem Relation Age of Onset   Hypertension Mother    Heart disease Father    Prostate cancer Brother    Diabetes Maternal Aunt    Kidney cancer Neg Hx    Bladder Cancer Neg Hx     Social History Social History   Tobacco Use   Smoking status: Never   Smokeless tobacco: Never  Substance Use Topics   Alcohol use: No   Drug use: No    Review of Systems  Review of Systems  Unable to perform ROS: Mental status change    ____________________________________________  PHYSICAL EXAM:      VITAL SIGNS: ED Triage Vitals  Enc Vitals Group     BP 08/12/21 0906 137/82     Pulse Rate 08/12/21 0906 77     Resp 08/12/21 0906 20     Temp 08/12/21 0906 98 F (36.7 C)     Temp Source 08/12/21 0906 Axillary     SpO2 08/12/21 0906 (!) 82 %     Weight --      Height --      Head Circumference --      [facility] --      Pain Score 08/12/21 0907 0     Pain Loc --      Pain Edu? --      Excl. in [place]? --      Physical Exam Vitals and nursing note reviewed.  Constitutional:      General: She is not in acute distress.    Appearance: She is well-developed. She is ill-appearing.  HENT:     Head: Normocephalic and atraumatic.     Mouth/Throat:     Mouth: Mucous  membranes are dry.  Eyes:     Conjunctiva/sclera: Conjunctivae normal.  Cardiovascular:     Rate and Rhythm: Normal rate and regular rhythm.     Heart sounds: Normal heart sounds.  Pulmonary:     Effort: Pulmonary effort is normal. No respiratory distress.     Breath sounds: No wheezing.  Abdominal:     General: Abdomen is flat. There is no distension.  Musculoskeletal:     Cervical back: Neck supple.  Skin:    General: Skin is warm.     Capillary Refill: Capillary refill takes less than 2 seconds.     Findings: No rash.  Neurological:     Mental Status: She is alert.     Motor: No abnormal muscle tone.     Comments: Oriented to person, time, and place in the hospital but does not know the city or specific hospital name.  Moves all extremities with at least antigravity strength.  Face is symmetric.  No obvious cranial nerve deficits.      ____________________________________________   LABS (all labs ordered are listed, but only abnormal results are displayed)  Labs Reviewed  URINALYSIS, COMPLETE (UACMP) WITH MICROSCOPIC - Abnormal; Notable for the following components:      Result Value   APPearance HAZY (*)    Hgb urine dipstick SMALL (*)    Ketones, ur TRACE (*)    Protein, ur 30 (*)    Leukocytes,Ua LARGE (*)    WBC, UA >50 (*)    All other components within normal limits  COMPREHENSIVE METABOLIC PANEL - Abnormal; Notable for the following components:   Potassium 5.3 (*)    Glucose, Bld 236 (*)    BUN 62 (*)    Creatinine, Ser 1.57 (*)    Albumin 3.1 (*)    GFR, Estimated 35 (*)    All other components within normal limits  CBG MONITORING, ED - Abnormal; Notable for the following components:   Glucose-Capillary 233 (*)    All other components within normal limits  URINE CULTURE  CULTURE, BLOOD (ROUTINE X 2)  CULTURE, BLOOD (ROUTINE X 2)  RESP PANEL BY RT-PCR (FLU A&B, COVID) ARPGX2  CBC WITH DIFFERENTIAL/PLATELET  BRAIN NATRIURETIC PEPTIDE  BLOOD GAS, VENOUS   CBG MONITORING, ED  TROPONIN I (HIGH SENSITIVITY)    ____________________________________________  EKG: Normal sinus rhythm, ventricular rate 79.  PR 174, QRS 108, QTc 419.  No acute ST elevations or depressions.  No EKG evidence of acute ischemia or infarct. ________________________________________  RADIOLOGY All imaging, including plain films, CT scans, and ultrasounds, independently reviewed by me, and interpretations confirmed via formal radiology reads.  ED MD interpretation:   CT head: No acute intracranial abnormality CT chest: Increased patchy densities in the posterior lower lobes Chest x-ray: Cardiomegaly with vascular congestion  Official radiology report(s): DG Chest 1 View  Result Date: 08/12/2021 CLINICAL DATA:  Lethargic, hypoxia, history of CHF EXAM: CHEST  1 VIEW COMPARISON:  Chest radiograph 07/27/2021 FINDINGS: The heart is enlarged, similar to the prior study. The upper mediastinal contours are stable. There is vascular congestion without definite overt pulmonary interstitial edema. There is no focal consolidation. There is no pleural effusion or pneumothorax. There is no acute osseous abnormality. IMPRESSION: Cardiomegaly with vascular congestion but no definite overt pulmonary edema. Electronically Signed   By: Valetta Mole M.D.   On: 08/12/2021 09:40   CT HEAD WO CONTRAST (5MM)  Result Date: 08/12/2021 CLINICAL DATA:  Mental status change, unknown cause. EXAM: CT HEAD WITHOUT CONTRAST TECHNIQUE: Contiguous axial images were obtained from the base of the skull through the vertex without intravenous contrast. COMPARISON:  07/27/2021 FINDINGS: Brain: No evidence of acute infarction, hemorrhage, hydrocephalus, extra-axial collection or mass lesion/mass effect. Vascular: No hyperdense vessel or unexpected calcification. Skull: Normal. Negative for fracture or focal lesion. Sinuses/Orbits: No acute finding. Other: None. IMPRESSION: No acute intracranial abnormality.  Electronically Signed   By: Markus Daft M.D.   On: 08/12/2021 11:45   CT Chest Wo Contrast  Result Date: 08/12/2021 CLINICAL DATA:  Respiratory illness, nondiagnostic x-ray. EXAM: CT CHEST WITHOUT CONTRAST TECHNIQUE: Multidetector CT imaging of the chest was performed following the standard protocol without IV contrast. COMPARISON:  07/27/2021 FINDINGS: Cardiovascular: Heart size is normal without significant pericardial fluid. Normal caliber of the thoracic aorta. Mediastinum/Nodes: No evidence for chest lymphadenopathy. Esophagus is unremarkable. No significant axillary lymph node enlargement. Thyroid tissue is unremarkable. Lungs/Pleura: Trachea and mainstem bronchi are patent. No pleural effusions. Increased consolidation in the posterior lower lobes bilaterally. Again noted are hazy densities in the lingula. Upper Abdomen: Cholecystectomy. Again noted is cortical scarring in the left kidney that is only partially imaged. No acute abnormality in the visualized upper abdomen. Musculoskeletal: No acute bone abnormality. IMPRESSION: Increased patchy densities in the posterior lower lobes compared to the previous chest CT. Findings could be related to volume loss but cannot exclude areas of infection. Electronically Signed   By: Markus Daft M.D.   On: 08/12/2021 11:54    ____________________________________________  PROCEDURES   Procedure(s) performed (including Critical Care):  .Critical Care Performed by: Duffy Bruce, MD Authorized by: Duffy Bruce, MD   Critical care provider statement:    Critical care time (minutes):  30   Critical care time was exclusive of:  Separately billable procedures and treating other patients   Critical care was necessary to treat or prevent imminent or life-threatening deterioration of the following conditions:  Cardiac failure, circulatory failure and sepsis   Critical care was time spent personally by me on the following activities:  Development of  treatment plan with patient or surrogate, discussions with consultants, evaluation of patient's response to treatment, examination of patient, ordering  and review of laboratory studies, ordering and review of radiographic studies, ordering and performing treatments and interventions, pulse oximetry, re-evaluation of patient's condition and review of old charts  ____________________________________________  INITIAL IMPRESSION / MDM / [place] / ED COURSE  As part of my medical decision making, I reviewed the following data within the [place] notes reviewed and incorporated, Old chart reviewed, Notes from prior ED visits, and [place] Controlled Substance Database       *Prachi L Michiels was evaluated in Emergency Department on 08/12/2021 for the symptoms described in the history of present illness. She was evaluated in the context of the global COVID-19 pandemic, which necessitated consideration that the patient might be at risk for infection with the SARS-CoV-2 virus that causes COVID-19. Institutional protocols and algorithms that pertain to the evaluation of patients at risk for COVID-19 are in a state of rapid change based on information released by regulatory bodies including the CDC and federal and state organizations. These policies and algorithms were followed during the patient's care in the ED.  Some ED evaluations and interventions may be delayed as a result of limited staffing during the pandemic.*     Medical Decision Making: 70 year old female with past medical history as above including recent admission for Pseudomonas UTI here with generalized confusion and weakness.  Suspect multifactorial recurrent encephalopathy, likely in the setting of recurrent UTI, possible aspiration with hypoxia, and AKI.  Lab work shows normal white blood cell count which is reassuring.  CMP shows AKI with doubling of BUN and creatinine from baseline.  UA is consistent with  recurrent UTI.  I reviewed her culture which showed pansensitive Pseudomonas.  Chest x-ray clear, but CT of the chest obtained given her hypoxia and is concerning for possible aspiration, which would fit clinically as well.  This could explain her more acute decompensation in the last 24 hours.  CT of the head negative.  Patient will be started on empiric, broad-spectrum antibiotics in addition to fluids.  Will admit to medicine.  ____________________________________________  FINAL CLINICAL IMPRESSION(S) / ED DIAGNOSES  Final diagnoses:  Acute cystitis with hematuria  Encephalopathy  Aspiration pneumonia of lower lobe, unspecified aspiration pneumonia type, unspecified laterality ([place])     MEDICATIONS GIVEN DURING THIS VISIT:  Medications  sodium chloride 0.9 % bolus 1,000 mL (has no administration in time range)  metroNIDAZOLE (FLAGYL) IVPB 500 mg (has no administration in time range)  ceFEPIme (MAXIPIME) 2 g in sodium chloride 0.9 % 100 mL IVPB (has no administration in time range)     ED Discharge Orders     None        Note:  This document was prepared using Dragon voice recognition software and may include unintentional dictation errors.   Duffy Bruce, MD 08/12/21 [id number]

## 2021-08-12 NOTE — ED Notes (Signed)
EDP, Isaacs at bedside.

## 2021-08-12 NOTE — ED Notes (Signed)
Patient is difficult stick; this RN unsuccessful at PIV attempt.  Next shift RN, Doretha Sou made aware.

## 2021-08-12 NOTE — ED Triage Notes (Signed)
First RN Note: pt to ED via [facility] with c/o L side numbness, per EMS pt with hx neuropathy and diabetes. Per EMS pt from [facility], states initially c/o L arm numbness with  no neuro deficits, then complaint changed to numbness to L leg and was able to feel L arm. Per EMS pt with no neuro deficits and able to move all 4 extremities without difficulty.     94% 4L- normally on RA, positional  CBG 251 80 HR 138/66

## 2021-08-13 ENCOUNTER — Encounter: Payer: Self-pay | Admitting: Obstetrics and Gynecology

## 2021-08-13 ENCOUNTER — Inpatient Hospital Stay: Payer: Medicare PPO

## 2021-08-13 DIAGNOSIS — J9601 Acute respiratory failure with hypoxia: Secondary | ICD-10-CM

## 2021-08-13 DIAGNOSIS — N390 Urinary tract infection, site not specified: Secondary | ICD-10-CM

## 2021-08-13 LAB — COMPREHENSIVE METABOLIC PANEL
ALT: 35 U/L (ref 0–44)
AST: 38 U/L (ref 15–41)
Albumin: 3 g/dL — ABNORMAL LOW (ref 3.5–5.0)
Alkaline Phosphatase: 121 U/L (ref 38–126)
Anion gap: 5 (ref 5–15)
BUN: 53 mg/dL — ABNORMAL HIGH (ref 8–23)
CO2: 24 mmol/L (ref 22–32)
Calcium: 8.9 mg/dL (ref 8.9–10.3)
Chloride: 109 mmol/L (ref 98–111)
Creatinine, Ser: 1.16 mg/dL — ABNORMAL HIGH (ref 0.44–1.00)
GFR, Estimated: 50 mL/min — ABNORMAL LOW (ref >60)
Glucose, Bld: 203 mg/dL — ABNORMAL HIGH (ref 70–99)
Potassium: 5 mmol/L (ref 3.5–5.1)
Sodium: 138 mmol/L (ref 135–145)
Total Bilirubin: 0.6 mg/dL (ref 0.3–1.2)
Total Protein: 7.2 g/dL (ref 6.5–8.1)

## 2021-08-13 LAB — CBC
HCT: 43.2 % (ref 36.0–46.0)
Hemoglobin: 13.9 g/dL (ref 12.0–15.0)
MCH: 29 pg (ref 26.0–34.0)
MCHC: 32.2 g/dL (ref 30.0–36.0)
MCV: 90.2 fL (ref 80.0–100.0)
Platelets: 257 10*3/uL (ref 150–400)
RBC: 4.79 MIL/uL (ref 3.87–5.11)
RDW: 13.5 % (ref 11.5–15.5)
WBC: 7.5 10*3/uL (ref 4.0–10.5)
nRBC: 0 % (ref 0.0–0.2)

## 2021-08-13 LAB — GLUCOSE, CAPILLARY
Glucose-Capillary: 221 mg/dL — ABNORMAL HIGH (ref 70–99)
Glucose-Capillary: 235 mg/dL — ABNORMAL HIGH (ref 70–99)
Glucose-Capillary: 249 mg/dL — ABNORMAL HIGH (ref 70–99)
Glucose-Capillary: 283 mg/dL — ABNORMAL HIGH (ref 70–99)
Glucose-Capillary: 177 mg/dL — ABNORMAL HIGH (ref 70–99)
Glucose-Capillary: 174 mg/dL — ABNORMAL HIGH (ref 70–99)

## 2021-08-13 IMAGING — CT CT ANGIO CHEST
2 of 7 series · 18 of 46 positions shown · IV contrast (APPLIED)
Comparison: CT scan [DATE]

CLINICAL DATA: Elevated D-dimer. Shortness of breath. Extravasation
of IV contrast during the study. The patient's nurse was notified by
the technologist.

EXAM:
CT ANGIOGRAPHY CHEST WITH CONTRAST
TECHNIQUE: Multidetector CT imaging of the chest was performed using the
standard protocol during bolus administration of intravenous
contrast. Multiplanar CT image reconstructions and MIPs were
obtained to evaluate the vascular anatomy.
CONTRAST:  75mL OMNIPAQUE IOHEXOL 350 MG/ML SOLN

[Series 5: thins · axial · 0.62mm/px · z∈[-844,-640]mm · 16 of 231 slices shown]
[im 13/231  lung]
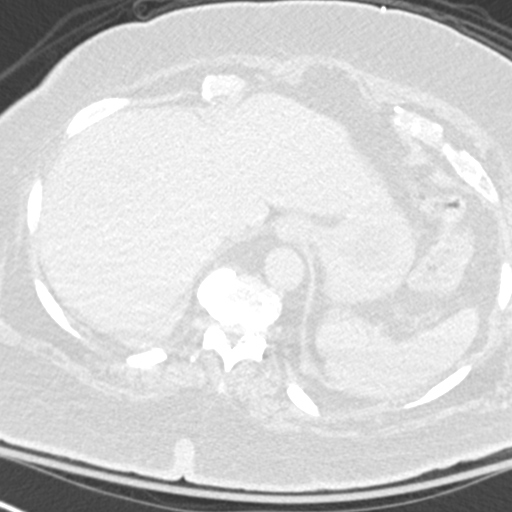
[im 26/231  soft-tissue]
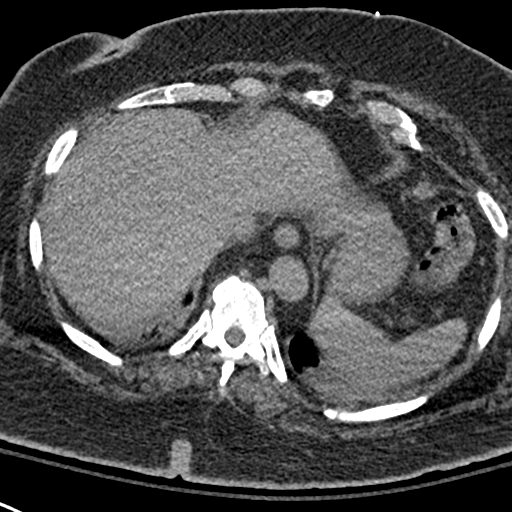
[im 39/231  lung]
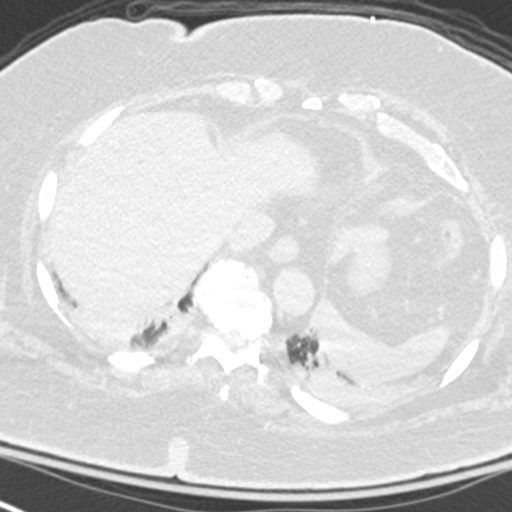
[im 52/231  soft-tissue]
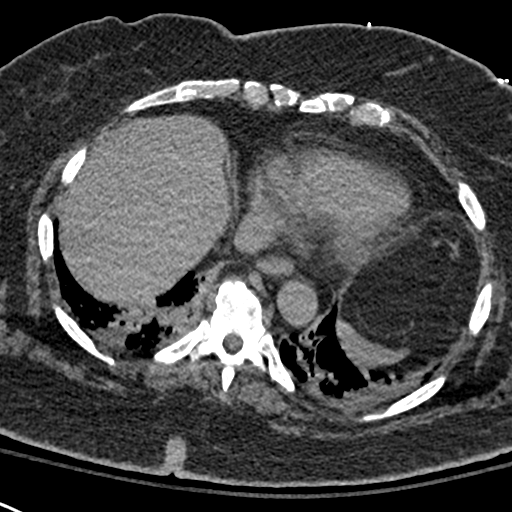
[im 64/231  lung]
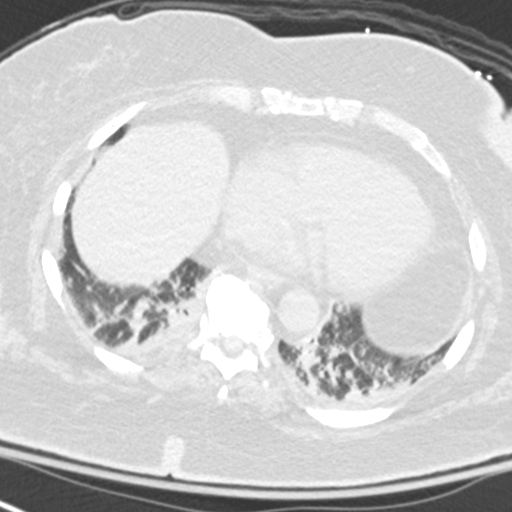
[im 77/231  soft-tissue]
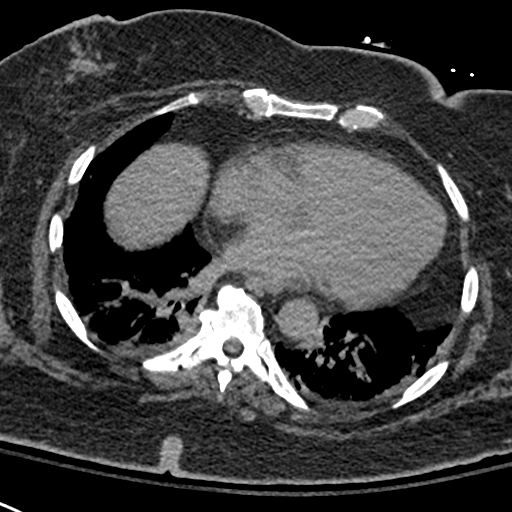
[im 90/231  lung]
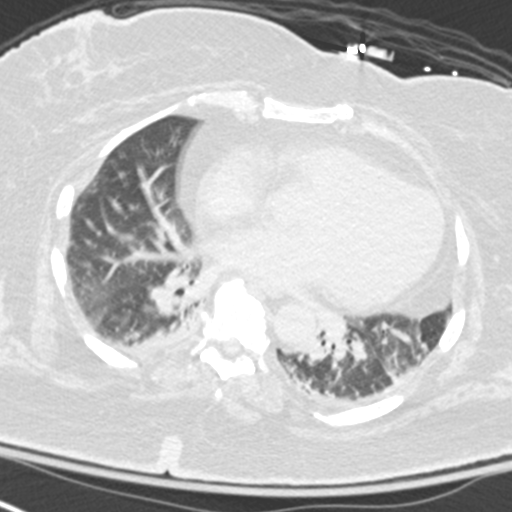
[im 103/231  soft-tissue]
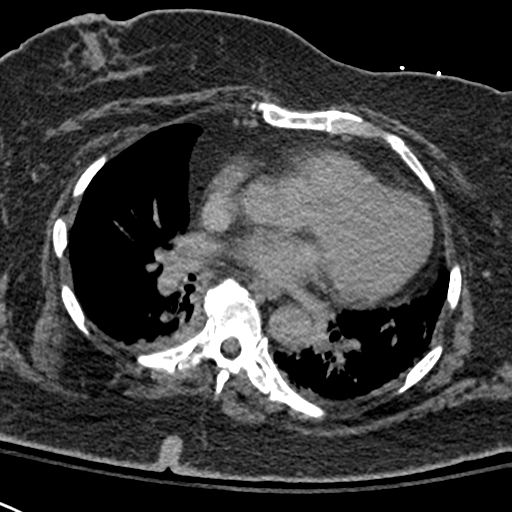
[im 128/231  lung]
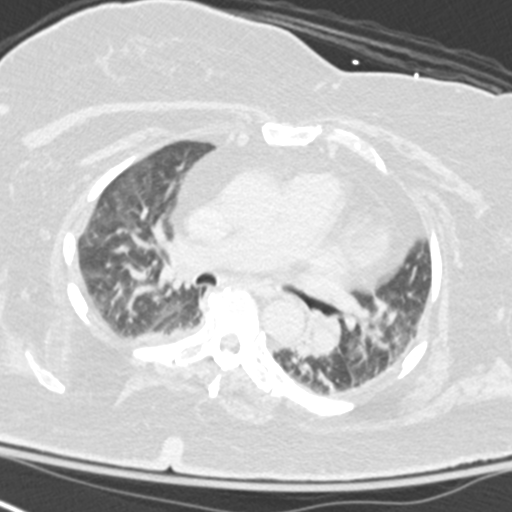
[im 141/231  soft-tissue]
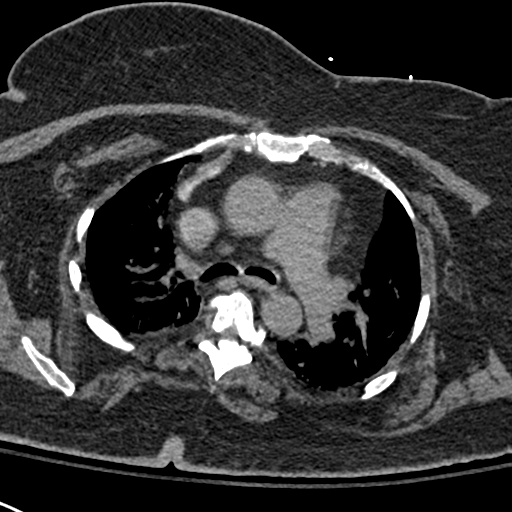
[im 154/231  lung]
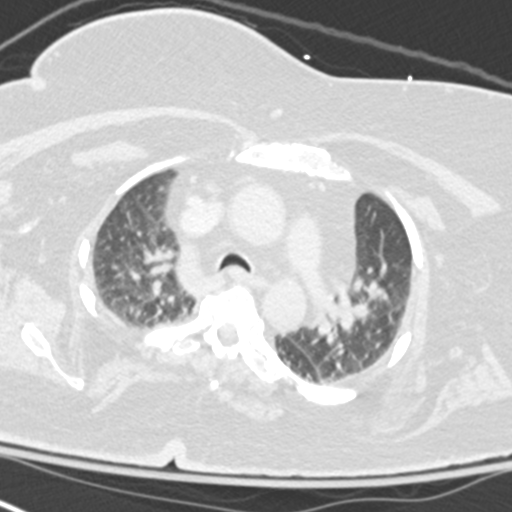
[im 167/231  soft-tissue]
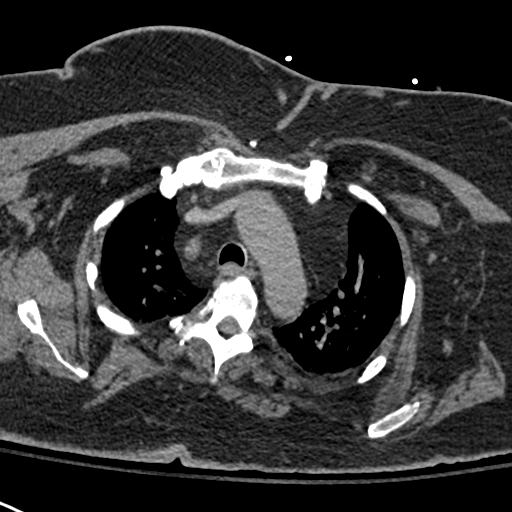
[im 179/231  lung]
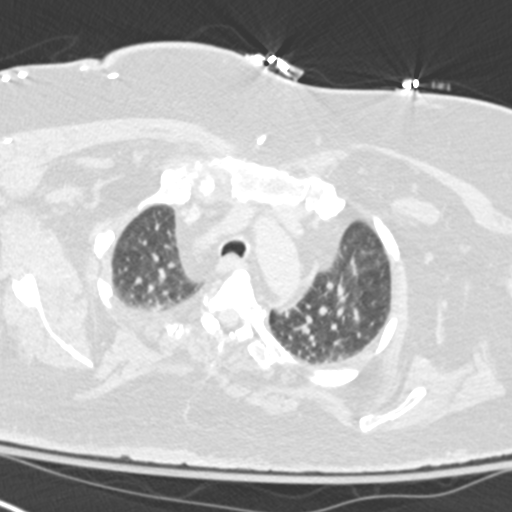
[im 192/231  soft-tissue]
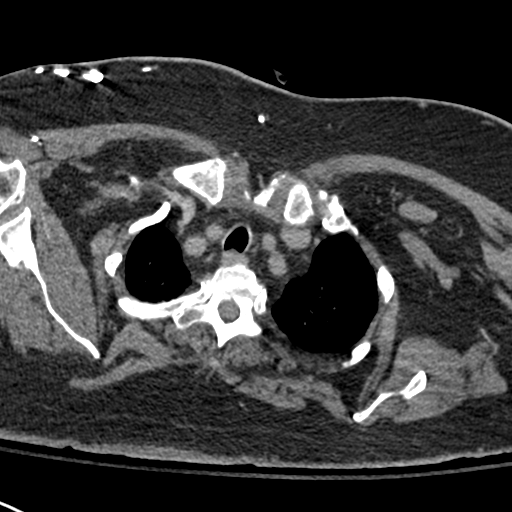
[im 205/231  lung]
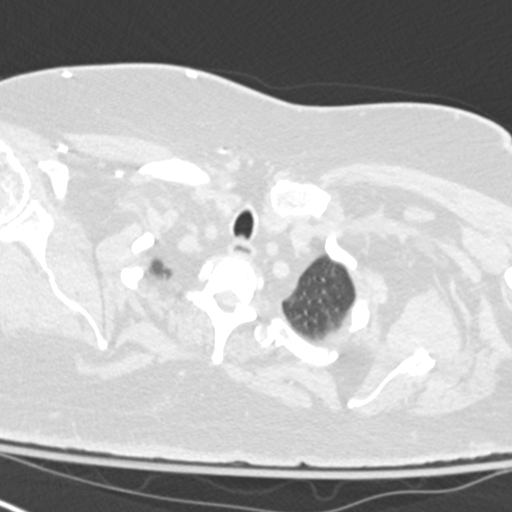
[im 218/231  soft-tissue]
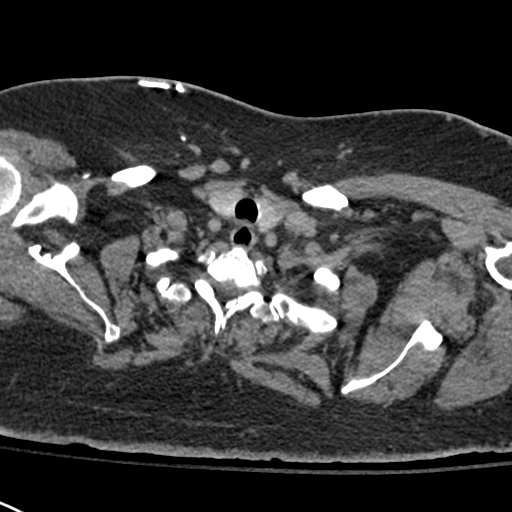

[Series 7: coronal mpr · coronal · 0.57mm/px · 2 of 107 slices shown]
[im 36/107  soft-tissue]
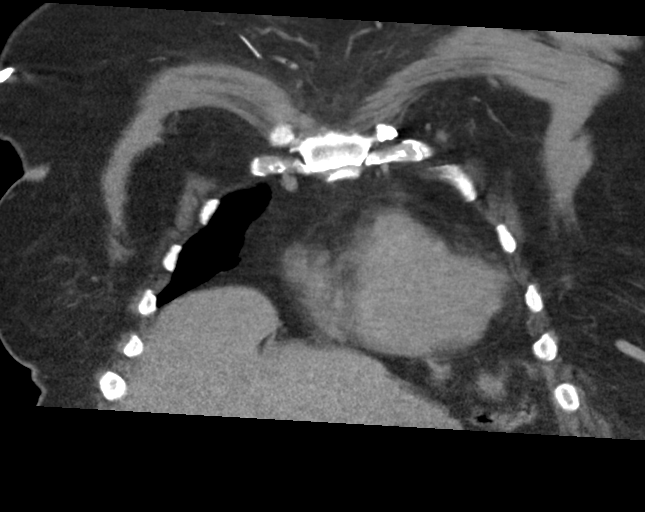
[im 71/107  soft-tissue]
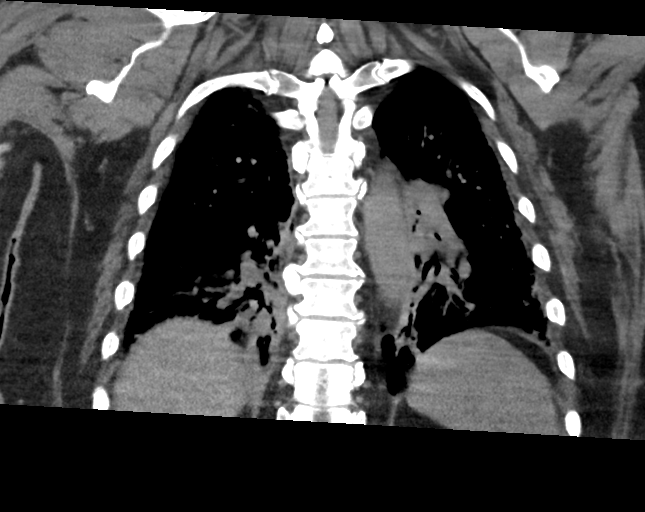

[18 of 46 positions shown; findings below may reference images not displayed]

FINDINGS: Cardiovascular: Due to IV contrast extravasation of the study, there
is suboptimal opacification of the aorta and pulmonary arteries.
Cardiomegaly identified. No obvious coronary artery disease on this
study. The thoracic aorta is nonaneurysmal with no identified
atherosclerotic change or dissection. Evaluation for pulmonary
emboli is limited due to suboptimal opacification. No obvious
pulmonary emboli identified.

Mediastinum/Nodes: Thyroid and esophagus are normal. No effusions
identified. The chest wall is normal. No adenopathy.

Lungs/Pleura: Central airways are normal. No pneumothorax. Bibasilar
opacities are centrally identical to yesterday's CT scan. No nodules
or masses are identified.

Upper Abdomen: No acute abnormality.

Musculoskeletal: No chest wall abnormality. No acute or significant
osseous findings.

Review of the MIP images confirms the above findings.
IMPRESSION: 1. Evaluation for pulmonary emboli is markedly limited due to
suboptimal contrast enhancement secondary to IV contrast
extravasation during the study. The study is nondiagnostic beyond
the level of the main pulmonary arteries. Even at the level of the
main pulmonary arteries, the study is limited. Within these
limitations, no emboli are identified. A repeat study could be
performed if clinical concern persists.
2. Bibasilar pulmonary opacities are unchanged. The findings could
represent atelectasis but pneumonia or aspiration are not excluded.
Recommend attention on follow-up.
3. No other significant abnormalities.

## 2021-08-13 MED ORDER — QUETIAPINE FUMARATE 25 MG PO TABS
50.0000 mg | ORAL_TABLET | Freq: Every day | ORAL | Status: DC
  Administered 2021-08-13: 22:00:00 50 mg via ORAL
  Filled 2021-08-13: qty 2

## 2021-08-13 MED ORDER — LORAZEPAM 2 MG/ML IJ SOLN
1.0000 mg | Freq: Once | INTRAMUSCULAR | Status: DC
  Filled 2021-08-13: qty 1

## 2021-08-13 MED ORDER — MELATONIN 5 MG PO TABS
2.5000 mg | ORAL_TABLET | Freq: Every day | ORAL | Status: DC
  Administered 2021-08-13 – 2021-08-17 (×5): 2.5 mg via ORAL
  Filled 2021-08-13 (×6): qty 0.5

## 2021-08-13 MED ORDER — IOHEXOL 350 MG/ML SOLN
75.0000 mL | Freq: Once | INTRAVENOUS | Status: AC | PRN
  Administered 2021-08-13: 75 mL via INTRAVENOUS

## 2021-08-13 NOTE — Progress Notes (Signed)
Attempted for several minutes to awaken patient for bedside swallow eval with limited success. Pt would open her eys briefly then close them again. Pt even received IV with ST present. Pt grimaced and fell back asleep. Discussed with sitter to only feed if fully alert. Will keep her on Dys 2 diet until swallowing can be assessed. ST to reattempt eval when able to stay awake to participate. Discussed with Nsg.

## 2021-08-13 NOTE — TOC Progression Note (Signed)
Transition of Care [facility]) - Progression Note    Patient Details  Name: [name] MRN: [id number] Date of Birth: 01-Feb-1951  [facility]) CM/SW [place], RN Phone Number: 08/13/2021, 10:29 AM  Clinical Narrative:    [facility] has accepted the patient to return, she will need a new Ins approval   Expected Discharge Plan: [place] Barriers to Discharge: Continued Medical Work up  Expected Discharge Plan and Services Expected Discharge Plan: [place]   Discharge Planning Services: CM Consult Post Acute Care Choice: [place] Living arrangements for the past 2 months: Single Family Home                 DME Arranged: N/A DME Agency: NA       HH Arranged: NA HH Agency: NA         Social Determinants of Health (SDOH) Interventions    Readmission Risk Interventions Readmission Risk Prevention Plan 08/12/2021  Transportation Screening Complete  PCP or Specialist Appt within 3-5 Days Complete  HRI or Home Care Consult Complete  Social Work Consult for [place] Planning/Counseling Complete  Palliative Care Screening Not Applicable  Medication Review Press photographer) Complete  Some recent data might be hidden

## 2021-08-13 NOTE — Progress Notes (Signed)
Assumed care of patient at  1400. IV fluids restarted, antibiotic started. BG rechecked and resulted as 249. MD stated to give insulin that was not given at due time. Orma Flaming, RN

## 2021-08-13 NOTE — Evaluation (Signed)
Occupational Therapy Evaluation Patient Details Name: Judith Shaw MRN: [id number] DOB: 1950-09-30 Today's Date: 08/13/2021   History of Present Illness Pt is a 70 y/o F admitted on 08/12/21 after presenting with hypoxia, hyperkalemia & fatigue. Pt was recently admitted for UTI & d/c to SNF & labs the day prior showed elevated K+. CT of chest shows likely atelectasis but question aspiration. MRI was limited due to pt behavior but limited exam shows no significant spinal canal stenosis or neural foraminal narrowing. PMH: morbid obesity, HTN, DM, dCHF, OSA not on CPAP, mild dementia, uterine CA, anxiety   Clinical Impression   Pt seen for OT/PT co-eval today d/t low activity tolerance. Her personal aide who is present in room reports that pt was able to take a few steps up until about a month ago, but has been getting progressively worse over 3 months and has required aide help at home for a year. Pt difficult to rouse, but does finally wake and attempt with multiple modes of stimuli and MAX cueing. She requires MAX A +2 to come to sitting. While seated EOB, she requires MIN/MOD A for g/h tasks including brushing hair. Pt requires MAX A +2 with RW and bilateral knee/foot blocking to attempt to stand x3 and cues to sequence walker use. Ultimately only one stand truly clears her bottom from bed and she's noted to still be bracing on bed. She requires MAX A +2 to reposition back in bed and MAX A +2 for bed level toileting. Pt left in bed in chair position to stimualte. All needs met and in reach. Her personal aide present and RN updated on session. Recommend f/u OT services in STR.      Recommendations for follow up therapy are one component of a multi-disciplinary discharge planning process, led by the attending physician.  Recommendations may be updated based on patient status, additional functional criteria and insurance authorization.   Follow Up Recommendations  Skilled nursing-short term rehab (<3  hours/day)    Assistance Recommended at Discharge Frequent or constant Supervision/Assistance  Functional Status Assessment  Patient has had a recent decline in their functional status and demonstrates the ability to make significant improvements in function in a reasonable and predictable amount of time.  Equipment Recommendations  Other (comment) (defer to next level of care)    Recommendations for Other Services       Precautions / Restrictions Precautions Precautions: Fall Restrictions Weight Bearing Restrictions: No      Mobility Bed Mobility Overal bed mobility: Needs Assistance Bed Mobility: Rolling;Supine to Sit;Sit to Supine Rolling: Min assist   Supine to sit: Max assist;+2 for physical assistance Sit to supine: Max assist;+2 for safety/equipment   General bed mobility comments: Pt seated in recliner at beginning/end of session    Transfers Overall transfer level: Needs assistance Equipment used: Rolling walker (2 wheels) Transfers: Sit to/from Stand Sit to Stand: Max assist;+2 physical assistance;+2 safety/equipment;From elevated surface           General transfer comment: pt strongly pushing posterior on EOB with BLE, able to clear buttocks from bed but less & less each time, requires max assist for proper hand placement & cuing for upright posture      Balance Overall balance assessment: Needs assistance Sitting-balance support: Bilateral upper extremity supported;Feet supported Sitting balance-Leahy Scale: Poor Sitting balance - Comments: Able to maintain balance while seated in recliner during grooming tasks   Standing balance support: During functional activity;Bilateral upper extremity supported;Reliant on assistive device for  balance Standing balance-Leahy Scale: Zero Standing balance comment: MAX A +2 for attempted stand that essentially isn't functional because pt is bracing on the bed and isn't able to full extend. One stand of three is close to  clearing completely                           ADL either performed or assessed with clinical judgement   ADL Overall ADL's : Needs assistance/impaired                                       General ADL Comments: MIN to MOD A for UB ADLs, MAX/TOTAL A for LB ADLs.     Vision Patient Visual Report: No change from baseline       Perception     Praxis      Pertinent Vitals/Pain Pain Assessment: Faces Faces Pain Scale: Hurts a little bit Pain Location: back Pain Descriptors / Indicators: Aching;Discomfort Pain Intervention(s): Limited activity within patient's tolerance;Repositioned     Hand Dominance Right   Extremity/Trunk Assessment Upper Extremity Assessment Upper Extremity Assessment: Generalized weakness   Lower Extremity Assessment Lower Extremity Assessment: Generalized weakness (L LE adducts)       Communication Communication Communication: No difficulties   Cognition Arousal/Alertness: Lethargic Behavior During Therapy: WFL for tasks assessed/performed Overall Cognitive Status: Within Functional Limits for tasks assessed                                 General Comments: Pt is AxO to self, and place, but not time w/o cues and not all aspects of situation (knows she's in hospital, but not why). Follows commands with time, mainly limited by lethargy.     General Comments       Exercises Other Exercises Other Exercises: Ed with pt and caregiver re: sleep wake cycles and attempting to stimulate throughout the day.   Shoulder Instructions      Home Living Family/patient expects to be discharged to:: Skilled nursing facility Living Arrangements:  (personal care aide [phone]) Available Help at Discharge: Personal care attendant Type of Home: House Home Access: Stairs to enter Entrance Stairs-Number of Steps: 1-2 Entrance Stairs-Rails: Right Home Layout: One level     Bathroom Shower/Tub: Tub/shower unit          Home Equipment: Conservation officer, nature (2 wheels);Shower seat          Prior Functioning/Environment Prior Level of Function : Needs assist             Mobility Comments: Pt has had a personal care aide for the last 1 year, but the past 3 months their care increased to 24 hrs/day. Pt would sleep sleep until 2pm & would stay awake until 3-4pm but recently has started sleeping more hours during the day, only waking up 30 min-2hours at a time then quickly returning to deep sleep. Pt has required more assistance & required help for gait, bathing, dressing, & feeding. Pt has been having more issues 2/2 neuropathy. ADLs Comments: Pt reports being mod I with self care tasks. Pt has assistance with IADLs. She reports people bring meals to the home.        OT Problem List: Decreased strength;Decreased activity tolerance;Impaired balance (sitting and/or standing);Decreased safety awareness;Decreased knowledge of use of DME or AE  OT Treatment/Interventions: Self-care/ADL training;Therapeutic exercise;Therapeutic activities;Energy conservation;DME and/or AE instruction;Patient/family education;Balance training    OT Goals(Current goals can be found in the care plan section) Acute Rehab OT Goals Patient Stated Goal: to get stronger OT Goal Formulation: With patient Time For Goal Achievement: 08/27/21 Potential to Achieve Goals: Good  OT Frequency: Min 2X/week   Barriers to D/C:            Co-evaluation PT/OT/SLP Co-Evaluation/Treatment: Yes Reason for Co-Treatment: Complexity of the patient's impairments (multi-system involvement);For patient/therapist safety;Necessary to address cognition/behavior during functional activity;To address functional/ADL transfers PT goals addressed during session: Mobility/safety with mobility OT goals addressed during session: ADL's and self-care      AM-PAC OT "6 Clicks" Daily Activity     Outcome Measure Help from another person eating meals?:  None Help from another person taking care of personal grooming?: A Little Help from another person toileting, which includes using toliet, bedpan, or urinal?: A Lot Help from another person bathing (including washing, rinsing, drying)?: Total Help from another person to put on and taking off regular upper body clothing?: A Lot Help from another person to put on and taking off regular lower body clothing?: Total 6 Click Score: 13   End of Session Equipment Utilized During Treatment: Gait belt;Rolling walker (2 wheels) Nurse Communication: Mobility status  Activity Tolerance: Patient tolerated treatment well Patient left: with call bell/phone within reach;with family/visitor present;in bed;with bed alarm set (personal aide present, chair position)  OT Visit Diagnosis: Unsteadiness on feet (R26.81);Muscle weakness (generalized) (M62.81)                Time: 8063-8685 OT Time Calculation (min): 17 min Charges:  OT General Charges $OT Visit: 1 Visit OT Evaluation $OT Eval Moderate Complexity: [place], [place], OTR/L ascom [phone] 08/13/21, 1:32 PM

## 2021-08-13 NOTE — Progress Notes (Addendum)
PROGRESS NOTE    Judith Shaw  QQI:[id number] DOB: [birth year] DOA: 08/12/2021 PCP: Pcp, No    Assessment & Plan:   Principal Problem:   Acute encephalopathy Active Problems:   HTN (hypertension)   Diabetes ([place])   Morbid obesity ([place])   Acute hypoxic respiratory failure: etiology unclear. 86% on RA. Continue on supplemental oxygen wean as tolerated. CXR shows vascular congestion but no definite overt pulmonary edema.   Elevated d-dimer: CTA chest show no emboli but markedly limited due to suboptimal contrast enhancement secondary to IV contrast extravasation during the study   Possible UTI: urine cx is pending. Continue on IV cefepime   Generalized weakness: PT/OT consulted    Hyperkalemia: resolved    AKI: Cr is trending down from day prior. Avoid nephrotoxic meds    DM2: likely poorly controlled. Continue on glargine, SSI w/ accuchecks   Low back pain: MRI L spine shows no significant spinal canal stenosis or neural foraminal narrowing but pt did not complete axial sequences      OSA: refuses to use CPAP    Chronic pain: hold home dose of gabapentin, duloxetine    Mild cognitive impairment: CT head shows no acute intracranial findings. Continue on seroquel, donepezil    Overactive bladder: hold home dose of myrbetriq  Chronic diastolic CHF: appears euvolemic. Continue to hold home dose of lasix  Morbid obesity: BMI 41.8. Complicates overall care and prognosis   DVT prophylaxis: lovenox  Code Status: full Family Communication: discussed pt's care w/ pt's sister [name] and answered her questions  Disposition Plan: depends on PT/OT recs   Level of care: Telemetry Medical  Status is: Inpatient  Remains inpatient appropriate because: severity of illness      Consultants:  none  Procedures:   Antimicrobials: cefepime, flagyl  Subjective: Pt c/o generalized weakness  Objective: Vitals:   08/12/21 1653 08/12/21 1938 08/13/21 0105 08/13/21 0621  BP: (!)  175/76 (!) 117/96 (!) 118/54 (!) 168/65  Pulse: 78 79 74 80  Resp: 18 18 20 20   Temp: 97.9 F (36.6 C) 97.8 F (36.6 C) 98.1 F (36.7 C) 98.5 F (36.9 C)  TempSrc:  Oral Oral Oral  SpO2: 96% 100% (!) 86% 90%  Weight:  114 kg    Height:  5\' 5"  (1.651 m)      Intake/Output Summary (Last 24 hours) at 08/13/2021 0724 Last data filed at 08/13/2021 0629 Gross per 24 hour  Intake 629.11 ml  Output 100 ml  Net 529.11 ml   Filed Weights   08/12/21 1330 08/12/21 1938  Weight: 114 kg 114 kg    Examination:  General exam: Appears calm and comfortable  Respiratory system: Clear to auscultation. Respiratory effort normal. Cardiovascular system: S1 & S2 +. No JVD, murmurs, rubs, gallops or clicks. No pedal edema. Gastrointestinal system: Abdomen is nondistended, soft and nontender. No organomegaly or masses felt. Normal bowel sounds heard. Central nervous system: Alert and oriented. No focal neurological deficits. Extremities: Symmetric 5 x 5 power. Skin: No rashes, lesions or ulcers Psychiatry: Judgement and insight appear normal. Mood & affect appropriate.     Data Reviewed: I have personally reviewed following labs and imaging studies  CBC: Recent Labs  Lab 08/12/21 0906 08/13/21 0607  WBC 8.1 7.5  NEUTROABS 4.8  --   HGB 14.6 13.9  HCT 45.7 43.2  MCV 89.6 90.2  PLT 284 941   Basic Metabolic Panel: Recent Labs  Lab 08/12/21 1029  NA 136  K 5.3*  CL 105  CO2 23  GLUCOSE 236*  BUN 62*  CREATININE 1.57*  CALCIUM 9.2   GFR: Estimated Creatinine Clearance: 41.4 mL/min (A) (by C-G formula based on SCr of 1.57 mg/dL (H)). Liver Function Tests: Recent Labs  Lab 08/12/21 1029  AST 41  ALT 35  ALKPHOS 116  BILITOT 0.6  PROT 7.3  ALBUMIN 3.1*   No results for input(s): LIPASE, AMYLASE in the last 168 hours. No results for input(s): AMMONIA in the last 168 hours. Coagulation Profile: No results for input(s): INR, PROTIME in the last 168 hours. Cardiac  Enzymes: Recent Labs  Lab 08/12/21 1933  CKTOTAL 378*   BNP (last 3 results) No results for input(s): PROBNP in the last 8760 hours. HbA1C: No results for input(s): HGBA1C in the last 72 hours. CBG: Recent Labs  Lab 08/12/21 0858 08/12/21 1657 08/12/21 1945  GLUCAP 233* 220* 277*   Lipid Profile: No results for input(s): CHOL, HDL, LDLCALC, TRIG, CHOLHDL, LDLDIRECT in the last 72 hours. Thyroid Function Tests: No results for input(s): TSH, T4TOTAL, FREET4, T3FREE, THYROIDAB in the last 72 hours. Anemia Panel: No results for input(s): VITAMINB12, FOLATE, FERRITIN, TIBC, IRON, RETICCTPCT in the last 72 hours. Sepsis Labs: No results for input(s): PROCALCITON, LATICACIDVEN in the last 168 hours.  Recent Results (from the past 240 hour(s))  Resp Panel by RT-PCR (Flu A&B, Covid) Nasopharyngeal Swab     Status: None   Collection Time: 08/12/21 12:43 PM   Specimen: Nasopharyngeal Swab; Nasopharyngeal(NP) swabs in vial transport medium  Result Value Ref Range Status   SARS Coronavirus 2 by RT PCR NEGATIVE NEGATIVE Final    Comment: (NOTE) SARS-CoV-2 target nucleic acids are NOT DETECTED.  The SARS-CoV-2 RNA is generally detectable in upper respiratory specimens during the acute phase of infection. The lowest concentration of SARS-CoV-2 viral copies this assay can detect is 138 copies/mL. A negative result does not preclude SARS-Cov-2 infection and should not be used as the sole basis for treatment or other patient management decisions. A negative result may occur with  improper specimen collection/handling, submission of specimen other than nasopharyngeal swab, presence of viral mutation(s) within the areas targeted by this assay, and inadequate number of viral copies(<138 copies/mL). A negative result must be combined with clinical observations, patient history, and epidemiological information. The expected result is Negative.  Fact Sheet for Patients:   EntrepreneurPulse.com.au  Fact Sheet for Healthcare Providers:  IncredibleEmployment.be  This test is no t yet approved or cleared by the [place] FDA and  has been authorized for detection and/or diagnosis of SARS-CoV-2 by FDA under an Emergency Use Authorization (EUA). This EUA will remain  in effect (meaning this test can be used) for the duration of the COVID-19 declaration under Section 564(b)(1) of the Act, 21 U.S.C.section 360bbb-3(b)(1), unless the authorization is terminated  or revoked sooner.       Influenza A by PCR NEGATIVE NEGATIVE Final   Influenza B by PCR NEGATIVE NEGATIVE Final    Comment: (NOTE) The Xpert Xpress SARS-CoV-2/FLU/RSV plus assay is intended as an aid in the diagnosis of influenza from Nasopharyngeal swab specimens and should not be used as a sole basis for treatment. Nasal washings and aspirates are unacceptable for Xpert Xpress SARS-CoV-2/FLU/RSV testing.  Fact Sheet for Patients: EntrepreneurPulse.com.au  Fact Sheet for Healthcare Providers: IncredibleEmployment.be  This test is not yet approved or cleared by the [place] FDA and has been authorized for detection and/or diagnosis of SARS-CoV-2 by FDA under an Emergency Use  Authorization (EUA). This EUA will remain in effect (meaning this test can be used) for the duration of the COVID-19 declaration under Section 564(b)(1) of the Act, 21 U.S.C. section 360bbb-3(b)(1), unless the authorization is terminated or revoked.  Performed at [facility], [address] [place], [place] [address]   Blood culture (routine x 2)     Status: None (Preliminary result)   Collection Time: 08/12/21  7:26 PM   Specimen: BLOOD  Result Value Ref Range Status   Specimen Description BLOOD RIGHT ANTECUBITAL  Final   Special Requests   Final    BOTTLES DRAWN AEROBIC AND ANAEROBIC Blood Culture adequate volume   Culture    Final    NO GROWTH < 12 HOURS Performed at [facility], [address], [place], [place] [address]    Report Status PENDING  Incomplete  Blood culture (routine x 2)     Status: None (Preliminary result)   Collection Time: 08/12/21  8:18 PM   Specimen: BLOOD  Result Value Ref Range Status   Specimen Description BLOOD RIGHT ANTECUBITAL  Final   Special Requests   Final    BOTTLES DRAWN AEROBIC AND ANAEROBIC Blood Culture adequate volume   Culture   Final    NO GROWTH < 12 HOURS Performed at [facility], [address], [place], [place] [address]    Report Status PENDING  Incomplete         Radiology Studies: DG Chest 1 View  Result Date: 08/12/2021 CLINICAL DATA:  Lethargic, hypoxia, history of CHF EXAM: CHEST  1 VIEW COMPARISON:  Chest radiograph 07/27/2021 FINDINGS: The heart is enlarged, similar to the prior study. The upper mediastinal contours are stable. There is vascular congestion without definite overt pulmonary interstitial edema. There is no focal consolidation. There is no pleural effusion or pneumothorax. There is no acute osseous abnormality. IMPRESSION: Cardiomegaly with vascular congestion but no definite overt pulmonary edema. Electronically Signed   By: Valetta Mole M.D.   On: 08/12/2021 09:40   CT HEAD WO CONTRAST (5MM)  Result Date: 08/12/2021 CLINICAL DATA:  Mental status change, unknown cause. EXAM: CT HEAD WITHOUT CONTRAST TECHNIQUE: Contiguous axial images were obtained from the base of the skull through the vertex without intravenous contrast. COMPARISON:  07/27/2021 FINDINGS: Brain: No evidence of acute infarction, hemorrhage, hydrocephalus, extra-axial collection or mass lesion/mass effect. Vascular: No hyperdense vessel or unexpected calcification. Skull: Normal. Negative for fracture or focal lesion. Sinuses/Orbits: No acute finding. Other: None. IMPRESSION: No acute intracranial abnormality. Electronically Signed   By: Markus Daft  M.D.   On: 08/12/2021 11:45   CT Chest Wo Contrast  Result Date: 08/12/2021 CLINICAL DATA:  Respiratory illness, nondiagnostic x-ray. EXAM: CT CHEST WITHOUT CONTRAST TECHNIQUE: Multidetector CT imaging of the chest was performed following the standard protocol without IV contrast. COMPARISON:  07/27/2021 FINDINGS: Cardiovascular: Heart size is normal without significant pericardial fluid. Normal caliber of the thoracic aorta. Mediastinum/Nodes: No evidence for chest lymphadenopathy. Esophagus is unremarkable. No significant axillary lymph node enlargement. Thyroid tissue is unremarkable. Lungs/Pleura: Trachea and mainstem bronchi are patent. No pleural effusions. Increased consolidation in the posterior lower lobes bilaterally. Again noted are hazy densities in the lingula. Upper Abdomen: Cholecystectomy. Again noted is cortical scarring in the left kidney that is only partially imaged. No acute abnormality in the visualized upper abdomen. Musculoskeletal: No acute bone abnormality. IMPRESSION: Increased patchy densities in the posterior lower lobes compared to the previous chest CT. Findings could be related to volume  loss but cannot exclude areas of infection. Electronically Signed   By: Markus Daft M.D.   On: 08/12/2021 11:54   MR LUMBAR SPINE WO CONTRAST  Result Date: 08/12/2021 CLINICAL DATA:  Low back pain, weakness and fatigue EXAM: MRI LUMBAR SPINE WITHOUT CONTRAST TECHNIQUE: Multiplanar, multisequence MR imaging of the lumbar spine was performed. No intravenous contrast was administered. COMPARISON:  No prior MRI, correlation is made with CT lumbar spine 04/30/2021. FINDINGS: Evaluation is limited, as patient would not complete axial sequences. Segmentation: Standard. Alignment:  No listhesis. Vertebrae:  No acute fracture or suspicious osseous lesion. Conus medullaris and cauda equina: Conus extends to the L2 level. Conus and cauda equina appear normal. Paraspinal and other soft tissues: Negative  Disc levels: Evaluation is limited by sagittal only imaging. Within this limitation, mild multilevel degenerative changes, with disc desiccation and small disc bulges, but no significant spinal canal stenosis or neural foraminal narrowing. IMPRESSION: Evaluation is limited as the patient would not complete axial sequences. Within this limitation, no significant spinal canal stenosis or neural foraminal narrowing. Electronically Signed   By: Merilyn Baba M.D.   On: 08/12/2021 22:51        Scheduled Meds:  aspirin EC  81 mg Oral Daily   donepezil  10 mg Oral QHS   DULoxetine  60 mg Oral Daily   enoxaparin (LOVENOX) injection  55 mg Subcutaneous QHS   famotidine  20 mg Oral QHS   insulin aspart  0-15 Units Subcutaneous TID WC   insulin aspart  0-5 Units Subcutaneous QHS   insulin glargine-yfgn  30 Units Subcutaneous QHS   QUEtiapine  25 mg Oral QHS   simvastatin  40 mg Oral QHS   Continuous Infusions:  sodium chloride 75 mL/hr at 08/12/21 1804   ceFEPime (MAXIPIME) IV 2 g (08/13/21 0142)   metronidazole     metronidazole       LOS: 1 day    Time spent: 33 mins     Wyvonnia Dusky, MD [facility] Pager [phone]  If 7PM-7AM, please contact night-coverage 08/13/2021, 7:24 AM

## 2021-08-13 NOTE — NC FL2 (Signed)
[place] LEVEL OF CARE SCREENING TOOL     IDENTIFICATION  Patient Name: KRYSTALL KRUCKENBERG Birthdate: 1951-06-14 Sex: female Admission Date (Current Location): 08/12/2021  [facility] and [place] Number:  Engineering geologist and Address:  [facility], [address], [place], [place] [address]      Provider Number: [id number]  Attending Physician Name and Address:  Wyvonnia Dusky, MD  Relative Name and Phone Number:  Abe People, sibling [phone]    Current Level of Care: Hospital Recommended Level of Care: [place] Prior Approval Number:    Date Approved/Denied:   PASRR Number: [id number]  Discharge Plan: SNF    Current Diagnoses: Patient Active Problem List   Diagnosis Date Noted   Acute encephalopathy 84/69/6295   Acute metabolic encephalopathy 28/41/3244   Acute pyelonephritis 07/28/2021   Severe sepsis ([place]) 07/28/2021   Severe sepsis with acute organ dysfunction ([place]) 07/27/2021   Atherosclerosis of native arteries of the extremities with ulceration ([place]) 02/24/2020   S/P amputation of lesser toe, left ([place]) 01/13/2020   Dementia without behavioral disturbance ([place]) 12/16/2019   Acute osteomyelitis of left foot ([place]) 12/15/2019   Memory impairment 11/30/2019   Complaints of memory disturbance 11/20/2019   Diabetic ulcer of left midfoot associated with type 2 diabetes mellitus ([place]) 11/18/2019   Seizure-like activity ([place]) 07/15/2019   Intractable 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 06/19/2019   Uncontrolled type 2 diabetes mellitus with hyperglycemia, with long-term current use of insulin ([place]) 04/22/2019   Panic attacks 03/26/2019   Benign essential HTN 07/19/2018   Fatty liver 05/15/2018   Hx of adenomatous colonic polyps 05/15/2018   Type 2 diabetes mellitus with diabetic nephropathy, with long-term current use of insulin (HCC) 05/01/2018   Tinnitus, bilateral 04/05/2017    Concussion syndrome 04/03/2017   Concussion without loss of consciousness 01/24/2017   Difficulty walking 01/24/2017   Headache disorder 01/24/2017   Numbness and tingling 01/24/2017   Postural urinary incontinence 01/24/2017   Sepsis ([place]) 09/21/2016   UTI (urinary tract infection) 09/21/2016   HTN (hypertension) 09/21/2016   Diabetes ([place]) 09/21/2016   Depression 09/21/2016   Health care maintenance 10/05/2015   Recurrent major depressive disorder, in full remission ([place]) 06/16/2014   DM (diabetes mellitus) type II controlled, neurological manifestation ([place]) 06/12/2014   Morbid obesity ([place]) 06/12/2014   Hyperlipidemia 03/10/2014   Chronic diastolic CHF (congestive heart failure) ([name]) 08/30/7251   H/O diastolic dysfunction 66/44/0347   Sleep apnea 03/10/2014   SOB (shortness of breath) 03/10/2014    Orientation RESPIRATION BLADDER Height & Weight     Self, Time, Situation, Place  O2 Continent Weight: 114 kg Height:  5\' 5"  (165.1 cm)  BEHAVIORAL SYMPTOMS/MOOD NEUROLOGICAL BOWEL NUTRITION STATUS      Continent Diet  AMBULATORY STATUS COMMUNICATION OF NEEDS Skin   Extensive Assist Verbally Normal                       Personal Care Assistance Level of Assistance    Bathing Assistance: Limited assistance Feeding assistance: Independent Dressing Assistance: Limited assistance     Functional Limitations Info             SPECIAL CARE FACTORS FREQUENCY  PT (By licensed PT), OT (By licensed OT)     PT Frequency: 5 times per week OT Frequency: 5 times per week            Contractures Contractures Info: Not present  Additional Factors Info  Code Status, Allergies Code Status Info: full code Allergies Info: codeine           Current Medications (08/13/2021):  This is the current hospital active medication list Current Facility-Administered Medications  Medication Dose Route Frequency Provider Last Rate Last Admin   0.9 %  sodium chloride infusion    Intravenous Continuous Gwynne Edinger, MD 75 mL/hr at 08/13/21 0929 New Bag at 08/13/21 0929   aspirin EC tablet 81 mg  81 mg Oral Daily Gwynne Edinger, MD   81 mg at 08/13/21 0926   ceFEPIme (MAXIPIME) 2 g in sodium chloride 0.9 % 100 mL IVPB  2 g Intravenous Q12H Rauer, Forde Dandy, RPH 200 mL/hr at 08/13/21 0142 2 g at 08/13/21 0142   donepezil (ARICEPT) tablet 10 mg  10 mg Oral QHS Gwynne Edinger, MD   10 mg at 08/12/21 1942   DULoxetine (CYMBALTA) DR capsule 60 mg  60 mg Oral Daily Gwynne Edinger, MD   60 mg at 08/13/21 0926   enoxaparin (LOVENOX) injection 55 mg  55 mg Subcutaneous QHS Gwynne Edinger, MD   55 mg at 08/12/21 1941   famotidine (PEPCID) tablet 20 mg  20 mg Oral QHS Gwynne Edinger, MD   20 mg at 08/12/21 1942   insulin aspart (novoLOG) injection 0-15 Units  0-15 Units Subcutaneous TID WC Gwynne Edinger, MD   5 Units at 08/13/21 9470   insulin aspart (novoLOG) injection 0-5 Units  0-5 Units Subcutaneous QHS Gwynne Edinger, MD   3 Units at 08/12/21 2137   insulin glargine-yfgn [facility]) injection 30 Units  30 Units Subcutaneous QHS Gwynne Edinger, MD   30 Units at 08/12/21 2137   LORazepam (ATIVAN) injection 1 mg  1 mg Intravenous Once Wyvonnia Dusky, MD       metroNIDAZOLE (FLAGYL) IVPB 500 mg  500 mg Intravenous Once Gwynne Edinger, MD 100 mL/hr at 08/13/21 0932 500 mg at 08/13/21 0932   metroNIDAZOLE (FLAGYL) IVPB 500 mg  500 mg Intravenous Q12H Wouk, Ailene Rud, MD       ondansetron (ZOFRAN-ODT) disintegrating tablet 4 mg  4 mg Oral Q8H PRN Wouk, Ailene Rud, MD       QUEtiapine (SEROQUEL) tablet 25 mg  25 mg Oral QHS Gwynne Edinger, MD   25 mg at 08/12/21 1943   simvastatin (ZOCOR) tablet 40 mg  40 mg Oral QHS Gwynne Edinger, MD   40 mg at 08/12/21 1942     Discharge Medications: Please see discharge summary for a list of discharge medications.  Relevant Imaging Results:  Relevant Lab Results:   Additional  Information SS# [id number]  Conception Oms, RN

## 2021-08-13 NOTE — Progress Notes (Signed)
OT Cancellation Note  Patient Details Name: Judith Shaw MRN: [id number] DOB: 1951/04/29   Cancelled Treatment:    Reason Eval/Treat Not Completed: Patient at procedure or test/ unavailable Pt OTF to MRI at this time. Will f/u at later date/time as able. Thank you.  Gerrianne Scale, [place], OTR/L ascom [phone] 08/13/21, 10:59 AM

## 2021-08-13 NOTE — Evaluation (Signed)
Physical Therapy Evaluation Patient Details Name: Judith Shaw MRN: [id number] DOB: [birth year] Today's Date: 08/13/2021  History of Present Illness  Pt is a 70 y/o F admitted on 08/12/21 after presenting with hypoxia, hyperkalemia & fatigue. Pt was recently admitted for UTI & d/c to SNF & labs the day prior showed elevated K+. CT of chest shows likely atelectasis but question aspiration. MRI was limited due to pt behavior but limited exam shows no significant spinal canal stenosis or neural foraminal narrowing. PMH: morbid obesity, HTN, DM, dCHF, OSA not on CPAP, mild dementia, uterine CA, anxiety  Clinical Impression  Pt seen for PT evaluation with co-tx with OT. Pt's sitter present during session as well. Pt received sound asleep, requiring max multimodal cuing for increased alertness but once sitting EOB pt is able open eyes, converse & follow commands although pt frequently asks to lie back down to sleep. PT & OT provide max encouragement/education re: participation to meet personal goals. Pt does transfer sit>stand multiple times from EOB with strong posterior lean onto bed with BLE & max assist for hand placement on RW, +2 for upright posture and buttocks clearing with PT/OT assisting with sitting higher up along EOB each time. Pt engaged in grooming tasks seated EOB with CGA for balance. Upon return to bed pt performs rolling for PT/OT to adjust linens. Pt left in chair position, encouraged to sit up & increase wake time with sitter present.       Recommendations for follow up therapy are one component of a multi-disciplinary discharge planning process, led by the attending physician.  Recommendations may be updated based on patient status, additional functional criteria and insurance authorization.  Follow Up Recommendations Skilled nursing-short term rehab (<3 hours/day)    Assistance Recommended at Discharge Frequent or constant Supervision/Assistance  Functional Status Assessment Patient  has had a recent decline in their functional status and demonstrates the ability to make significant improvements in function in a reasonable and predictable amount of time.  Equipment Recommendations  None recommended by PT    Recommendations for Other Services       Precautions / Restrictions Precautions Precautions: Fall Restrictions Weight Bearing Restrictions: No      Mobility  Bed Mobility Overal bed mobility: Needs Assistance Bed Mobility: Rolling;Supine to Sit;Sit to Supine Rolling: Min assist (cuing for use of bed rails)   Supine to sit: Max assist;+2 for physical assistance (assistance to move BLE to EOB & upright trunk) Sit to supine: Max assist;+2 for safety/equipment (assistance to lower head to pillow, elevate BLE onto bed)        Transfers Overall transfer level: Needs assistance Equipment used: Rolling walker (2 wheels) Transfers: Sit to/from Stand Sit to Stand: Max assist;+2 physical assistance;+2 safety/equipment;From elevated surface           General transfer comment: pt strongly pushing posterior on EOB with BLE, able to clear buttocks from bed but less & less each time, requires max assist for proper hand placement & cuing for upright posture    Ambulation/Gait                  Stairs            Wheelchair Mobility    Modified Rankin (Stroke Patients Only)       Balance Overall balance assessment: Needs assistance Sitting-balance support: Bilateral upper extremity supported;Feet supported Sitting balance-Leahy Scale: Poor     Standing balance support: During functional activity;Bilateral upper extremity supported;Reliant on assistive device for  balance Standing balance-Leahy Scale: Zero                               Pertinent Vitals/Pain Pain Assessment: Faces Faces Pain Scale: Hurts a little bit Pain Location: back Pain Descriptors / Indicators: Aching;Discomfort Pain Intervention(s):  Repositioned;Monitored during session    Home Living Family/patient expects to be discharged to:: Skilled nursing facility Living Arrangements:  (personal care aide x 24 hrs) Available Help at Discharge: Personal care attendant Type of Home: House Home Access: Stairs to enter Entrance Stairs-Rails: Right Entrance Stairs-Number of Steps: 1-2   Home Layout: One level        Prior Function               Mobility Comments: Pt has had a personal care aide for the last 1 year, but the past 3 months their care increased to 24 hrs/day. Pt would sleep sleep until 2pm & would stay awake until 3-4pm but recently has started sleeping more hours during the day, only waking up 30 min-2hours at a time then quickly returning to deep sleep. Pt has required more assistance & required help for gait, bathing, dressing, & feeding. Pt has been having more issues 2/2 neuropathy.       Hand Dominance        Extremity/Trunk Assessment   Upper Extremity Assessment Upper Extremity Assessment: Generalized weakness    Lower Extremity Assessment Lower Extremity Assessment:  (LLE adducts, requires cuing for increased BOS, 3-/5 R knee extension, 2/5 L knee extension in sitting)       Communication   Communication: No difficulties  Cognition Arousal/Alertness: Lethargic Behavior During Therapy: WFL for tasks assessed/performed Overall Cognitive Status: Within Functional Limits for tasks assessed                                 General Comments: Pt is AxO to situation, follows commands with time, mainly limited by lethargy.        General Comments      Exercises     Assessment/Plan    PT Assessment Patient needs continued PT services  PT Problem List Decreased strength;Decreased range of motion;Decreased cognition;Obesity;Decreased activity tolerance;Decreased balance;Decreased mobility;Decreased knowledge of precautions;Impaired sensation;Decreased safety awareness;Decreased  knowledge of use of DME;Pain       PT Treatment Interventions DME instruction;Gait training;Stair training;Functional mobility training;Therapeutic activities;Therapeutic exercise;Patient/family education;Balance training;Neuromuscular re-education;Cognitive remediation;Wheelchair mobility training    PT Goals (Current goals can be found in the Care Plan section)  Acute Rehab PT Goals Patient Stated Goal: get strong, walk, return to PLOF PT Goal Formulation: With patient Time For Goal Achievement: 08/27/21 Potential to Achieve Goals: Fair    Frequency Min 2X/week   Barriers to discharge Inaccessible home environment      Co-evaluation PT/OT/SLP Co-Evaluation/Treatment: Yes Reason for Co-Treatment: Complexity of the patient's impairments (multi-system involvement);Necessary to address cognition/behavior during functional activity;For patient/therapist safety;To address functional/ADL transfers PT goals addressed during session: Mobility/safety with mobility;Balance;Proper use of DME         AM-PAC PT "6 Clicks" Mobility  Outcome Measure Help needed turning from your back to your side while in a flat bed without using bedrails?: A Lot Help needed moving from lying on your back to sitting on the side of a flat bed without using bedrails?: Total Help needed moving to and from a bed to a chair (including a  wheelchair)?: Total Help needed standing up from a chair using your arms (e.g., wheelchair or bedside chair)?: Total Help needed to walk in hospital room?: Total Help needed climbing 3-5 steps with a railing? : Total 6 Click Score: 7    End of Session Equipment Utilized During Treatment: Gait belt Activity Tolerance: Patient limited by lethargy Patient left: in bed;with bed alarm set;with call bell/phone within reach;with nursing/sitter in room (in chair position) Nurse Communication: Mobility status PT Visit Diagnosis: Unsteadiness on feet (R26.81);Muscle weakness  (generalized) (M62.81);Difficulty in walking, not elsewhere classified (R26.2)    Time: 8346-2194 PT Time Calculation (min) (ACUTE ONLY): 23 min   Charges:   PT Evaluation $PT Eval High Complexity: 1 High          Lavone Nian, PT, DPT 08/13/21, 1:01 PM   Waunita Schooner 08/13/2021, 12:59 PM

## 2021-08-13 NOTE — Progress Notes (Signed)
Received call from MRI stating patient was "yelling and being combative." Patient had received 1mg  lorazepam prior to MRI. Notified Sharion Settler, NP and orders for diazepam were placed. En route to MRI, this nurse received call that patient was being transported back to unit because she refused the MRI. Robina Ade, NP.

## 2021-08-13 NOTE — TOC Progression Note (Signed)
Transition of Care [facility]) - Progression Note    Patient Details  Name: Judith Shaw MRN: [id number] Date of Birth: Feb 14, 1951  [facility] of [facility]) CM/SW [place], RN Phone Number: 08/13/2021, 9:43 AM  Clinical Narrative:    Spoke with the patient's sister [name] on the phone, she is the POA, and legal guardian, She stated that she would love for the patient to go to [facility], I explained that they do not have a bed available, She stated she would like for her to go back to [facility] and the second choice would be [place] was sent to both, The patient has paid sitters and aides that will also be attending the patient I explained that a new insurance approval would have to be obtained for a new episode, she stated understanding    Expected Discharge Plan: Skilled Nursing Facility Barriers to Discharge: Continued Medical Work up  Expected Discharge Plan and Services Expected Discharge Plan: [place]   Discharge Planning Services: CM Consult Post Acute Care Choice: [place] Living arrangements for the past 2 months: Single Family Home                 DME Arranged: N/A DME Agency: NA       HH Arranged: NA HH Agency: NA         Social Determinants of Health (SDOH) Interventions    Readmission Risk Interventions Readmission Risk Prevention Plan 08/12/2021  Transportation Screening Complete  PCP or Specialist Appt within 3-5 Days Complete  HRI or Home Care Consult Complete  Social Work Consult for [place] Planning/Counseling Complete  Palliative Care Screening Not Applicable  Medication Review Press photographer) Complete  Some recent data might be hidden

## 2021-08-14 LAB — BLOOD GAS, ARTERIAL
Acid-base deficit: 0.9 mmol/L (ref 0.0–2.0)
Bicarbonate: 25.4 mmol/L (ref 20.0–28.0)
O2 Saturation: 86.7 %
Patient temperature: 37
pCO2 arterial: 47 mmHg (ref 32.0–48.0)
pH, Arterial: 7.34 — ABNORMAL LOW (ref 7.350–7.450)
pO2, Arterial: 56 mmHg — ABNORMAL LOW (ref 83.0–108.0)

## 2021-08-14 LAB — GLUCOSE, CAPILLARY
Glucose-Capillary: 171 mg/dL — ABNORMAL HIGH (ref 70–99)
Glucose-Capillary: 222 mg/dL — ABNORMAL HIGH (ref 70–99)
Glucose-Capillary: 234 mg/dL — ABNORMAL HIGH (ref 70–99)
Glucose-Capillary: 177 mg/dL — ABNORMAL HIGH (ref 70–99)
Glucose-Capillary: 157 mg/dL — ABNORMAL HIGH (ref 70–99)

## 2021-08-14 LAB — CBC
HCT: 41.3 % (ref 36.0–46.0)
Hemoglobin: 13.2 g/dL (ref 12.0–15.0)
MCH: 28.9 pg (ref 26.0–34.0)
MCHC: 32 g/dL (ref 30.0–36.0)
MCV: 90.4 fL (ref 80.0–100.0)
Platelets: 248 10*3/uL (ref 150–400)
RBC: 4.57 MIL/uL (ref 3.87–5.11)
RDW: 13.6 % (ref 11.5–15.5)
WBC: 6.3 10*3/uL (ref 4.0–10.5)
nRBC: 0 % (ref 0.0–0.2)

## 2021-08-14 LAB — BASIC METABOLIC PANEL
Anion gap: 3 — ABNORMAL LOW (ref 5–15)
BUN: 37 mg/dL — ABNORMAL HIGH (ref 8–23)
CO2: 23 mmol/L (ref 22–32)
Calcium: 9.1 mg/dL (ref 8.9–10.3)
Chloride: 110 mmol/L (ref 98–111)
Creatinine, Ser: 0.84 mg/dL (ref 0.44–1.00)
GFR, Estimated: >60 mL/min (ref >60)
Glucose, Bld: 149 mg/dL — ABNORMAL HIGH (ref 70–99)
Potassium: 4.8 mmol/L (ref 3.5–5.1)
Sodium: 136 mmol/L (ref 135–145)

## 2021-08-14 LAB — URINE CULTURE: Culture: 80000 — AB

## 2021-08-14 LAB — PROCALCITONIN: Procalcitonin: <0.1 ng/mL

## 2021-08-14 MED ORDER — FLUCONAZOLE 50 MG PO TABS
150.0000 mg | ORAL_TABLET | Freq: Every day | ORAL | Status: AC
  Administered 2021-08-14 – 2021-08-16 (×3): 150 mg via ORAL
  Filled 2021-08-14 (×3): qty 1

## 2021-08-14 MED ORDER — SODIUM CHLORIDE 0.9 % IV SOLN
3.0000 g | Freq: Four times a day (QID) | INTRAVENOUS | Status: DC
  Administered 2021-08-14 – 2021-08-18 (×15): 3 g via INTRAVENOUS
  Filled 2021-08-14: qty 8
  Filled 2021-08-14: qty 3
  Filled 2021-08-14 (×3): qty 8
  Filled 2021-08-14 (×2): qty 3
  Filled 2021-08-14 (×3): qty 8
  Filled 2021-08-14: qty 3
  Filled 2021-08-14 (×5): qty 8
  Filled 2021-08-14: qty 3

## 2021-08-14 MED ORDER — QUETIAPINE FUMARATE 25 MG PO TABS
25.0000 mg | ORAL_TABLET | Freq: Every day | ORAL | Status: DC
  Administered 2021-08-14: 21:00:00 25 mg via ORAL
  Filled 2021-08-14: qty 1

## 2021-08-14 NOTE — Progress Notes (Incomplete)
Was contacted by Montine Circle, 1C charge RN. She stated the guardian of the patient, Judith Shaw, wanted to speak with the Administrative Coordinator. Met [name], explained she was upset about the visitor policy as her sister is dying. Stated it was not documented as such. She said she was told that. Stated she wanted to pray with her two brothers and the patient for about 30 minutes. I told her they could do that. She stated she had been thinking about calling security over this situation. 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 Also stated she was frustrated with many things about the patients treatment, including she was supposed to get fluids yesterday but they were not hanging. She stated "it's been a fight every step of the way during this hospitalization and it should not be like that. The only issue she shared was the visitation and the IV fluid from yesterday.

## 2021-08-14 NOTE — Progress Notes (Addendum)
PROGRESS NOTE    Judith Shaw  YJE:[id number] DOB: 01-11-1951 DOA: 08/12/2021 PCP: Pcp, No    Assessment & Plan:   Principal Problem:   Acute encephalopathy Active Problems:   HTN (hypertension)   Diabetes ([place])   Morbid obesity ([place])   Acute hypoxic respiratory failure: etiology unclear. 86% on RA on 12/17. Weaned off of supplemental oxygen while awake but needs supplemental oxygen when sleeping as pt has hx OSA.   Elevated d-dimer: CTA chest show no emboli but markedly limited due to suboptimal contrast enhancement secondary to IV contrast extravasation during the study   Possible pneumonia: as per CT scan. Changed abxs to IV unasyn   Yeast infection: urine cx is growing yeast. Started on fluconazole x 3 days   Generalized weakness: PT/OT recs SNF    Hyperkalemia: resolved    AKI: resolved     DM2: likely poorly controlled. Continue on glargine, SSI w/ accuchecks   Low back pain: MRI L spine shows no significant spinal canal stenosis or neural foraminal narrowing but pt did not complete axial sequences      OSA: refuses to use CPAP    Chronic pain: continue to hold home dose of gabapentin, duloxetine    Mild cognitive impairment: CT head shows no acute intracranial findings. Continue on home dose of seroquel, donepezil    Overactive bladder: hold home dose of myrbetriq  Chronic diastolic CHF: appears euvolemic. Continue to hold home dose of lasix   Morbid obesity: BMI 41.8. Complicates overall care & prognosis   DVT prophylaxis: lovenox  Code Status: full Family Communication: discussed pt's care w/ pt's sister [name] and answered her questions  Disposition Plan: likely d/c back to SNF   Level of care: Telemetry Medical  Status is: Inpatient  Remains inpatient appropriate because: severity of illness      Consultants:  none  Procedures:   Antimicrobials: cefepime, flagyl  Subjective: Pt c/o generalized weakness  Objective: Vitals:   08/13/21  1547 08/13/21 1955 08/14/21 0048 08/14/21 0407  BP: (!) 156/61 (!) 147/60 (!) 106/51 (!) 107/51  Pulse: 77 65 66 76  Resp: 20 18 18 16   Temp: 98 F (36.7 C) 98.1 F (36.7 C) 97.9 F (36.6 C) 98 F (36.7 C)  TempSrc:  Oral Oral Oral  SpO2: 96% 95% 97% 100%  Weight:      Height:        Intake/Output Summary (Last 24 hours) at 08/14/2021 0718 Last data filed at 08/14/2021 0400 Gross per 24 hour  Intake 2267.43 ml  Output 350 ml  Net 1917.43 ml   Filed Weights   08/12/21 1330 08/12/21 1938  Weight: 114 kg 114 kg    Examination:  General exam: Appears calm and comfortable  Respiratory system: Clear to auscultation. Respiratory effort normal. Cardiovascular system: S1 & S2 +. No JVD, murmurs, rubs, gallops or clicks. No pedal edema. Gastrointestinal system: Abdomen is nondistended, soft and nontender. No organomegaly or masses felt. Normal bowel sounds heard. Central nervous system: Alert and oriented. No focal neurological deficits. Extremities: Symmetric 5 x 5 power. Skin: No rashes, lesions or ulcers Psychiatry: Judgement and insight appear normal. Mood & affect appropriate.     Data Reviewed: I have personally reviewed following labs and imaging studies  CBC: Recent Labs  Lab 08/12/21 0906 08/13/21 0607 08/14/21 0417  WBC 8.1 7.5 6.3  NEUTROABS 4.8  --   --   HGB 14.6 13.9 13.2  HCT 45.7 43.2 41.3  MCV 89.6 90.2  90.4  PLT 284 257 578   Basic Metabolic Panel: Recent Labs  Lab 08/12/21 1029 08/13/21 0607 08/14/21 0417  NA 136 138 136  K 5.3* 5.0 4.8  CL 105 109 110  CO2 23 24 23   GLUCOSE 236* 203* 149*  BUN 62* 53* 37*  CREATININE 1.57* 1.16* 0.84  CALCIUM 9.2 8.9 9.1   GFR: Estimated Creatinine Clearance: 77.4 mL/min (by C-G formula based on SCr of 0.84 mg/dL). Liver Function Tests: Recent Labs  Lab 08/12/21 1029 08/13/21 0607  AST 41 38  ALT 35 35  ALKPHOS 116 121  BILITOT 0.6 0.6  PROT 7.3 7.2  ALBUMIN 3.1* 3.0*   No results for  input(s): LIPASE, AMYLASE in the last 168 hours. No results for input(s): AMMONIA in the last 168 hours. Coagulation Profile: No results for input(s): INR, PROTIME in the last 168 hours. Cardiac Enzymes: Recent Labs  Lab 08/12/21 1933  CKTOTAL 378*   BNP (last 3 results) No results for input(s): PROBNP in the last 8760 hours. HbA1C: No results for input(s): HGBA1C in the last 72 hours. CBG: Recent Labs  Lab 08/13/21 1253 08/13/21 1437 08/13/21 1527 08/13/21 1846 08/13/21 1958  GLUCAP 235* 249* 283* 177* 174*   Lipid Profile: No results for input(s): CHOL, HDL, LDLCALC, TRIG, CHOLHDL, LDLDIRECT in the last 72 hours. Thyroid Function Tests: No results for input(s): TSH, T4TOTAL, FREET4, T3FREE, THYROIDAB in the last 72 hours. Anemia Panel: No results for input(s): VITAMINB12, FOLATE, FERRITIN, TIBC, IRON, RETICCTPCT in the last 72 hours. Sepsis Labs: No results for input(s): PROCALCITON, LATICACIDVEN in the last 168 hours.  Recent Results (from the past 240 hour(s))  Resp Panel by RT-PCR (Flu A&B, Covid) Nasopharyngeal Swab     Status: None   Collection Time: 08/12/21 12:43 PM   Specimen: Nasopharyngeal Swab; Nasopharyngeal(NP) swabs in vial transport medium  Result Value Ref Range Status   SARS Coronavirus 2 by RT PCR NEGATIVE NEGATIVE Final    Comment: (NOTE) SARS-CoV-2 target nucleic acids are NOT DETECTED.  The SARS-CoV-2 RNA is generally detectable in upper respiratory specimens during the acute phase of infection. The lowest concentration of SARS-CoV-2 viral copies this assay can detect is 138 copies/mL. A negative result does not preclude SARS-Cov-2 infection and should not be used as the sole basis for treatment or other patient management decisions. A negative result may occur with  improper specimen collection/handling, submission of specimen other than nasopharyngeal swab, presence of viral mutation(s) within the areas targeted by this assay, and inadequate  number of viral copies(<138 copies/mL). A negative result must be combined with clinical observations, patient history, and epidemiological information. The expected result is Negative.  Fact Sheet for Patients:  EntrepreneurPulse.com.au  Fact Sheet for Healthcare Providers:  IncredibleEmployment.be  This test is no t yet approved or cleared by the [place] FDA and  has been authorized for detection and/or diagnosis of SARS-CoV-2 by FDA under an Emergency Use Authorization (EUA). This EUA will remain  in effect (meaning this test can be used) for the duration of the COVID-19 declaration under Section 564(b)(1) of the Act, 21 U.S.C.section 360bbb-3(b)(1), unless the authorization is terminated  or revoked sooner.       Influenza A by PCR NEGATIVE NEGATIVE Final   Influenza B by PCR NEGATIVE NEGATIVE Final    Comment: (NOTE) The Xpert Xpress SARS-CoV-2/FLU/RSV plus assay is intended as an aid in the diagnosis of influenza from Nasopharyngeal swab specimens and should not be used as a sole basis  for treatment. Nasal washings and aspirates are unacceptable for Xpert Xpress SARS-CoV-2/FLU/RSV testing.  Fact Sheet for Patients: EntrepreneurPulse.com.au  Fact Sheet for Healthcare Providers: IncredibleEmployment.be  This test is not yet approved or cleared by the [place] FDA and has been authorized for detection and/or diagnosis of SARS-CoV-2 by FDA under an Emergency Use Authorization (EUA). This EUA will remain in effect (meaning this test can be used) for the duration of the COVID-19 declaration under Section 564(b)(1) of the Act, 21 U.S.C. section 360bbb-3(b)(1), unless the authorization is terminated or revoked.  Performed at [facility], [address] [place], [place] [address]   Blood culture (routine x 2)     Status: None (Preliminary result)   Collection Time: 08/12/21  7:26  PM   Specimen: BLOOD  Result Value Ref Range Status   Specimen Description BLOOD RIGHT ANTECUBITAL  Final   Special Requests   Final    BOTTLES DRAWN AEROBIC AND ANAEROBIC Blood Culture adequate volume   Culture   Final    NO GROWTH 2 DAYS Performed at [facility], [address], [place], [place] [address]    Report Status PENDING  Incomplete  Blood culture (routine x 2)     Status: None (Preliminary result)   Collection Time: 08/12/21  8:18 PM   Specimen: BLOOD  Result Value Ref Range Status   Specimen Description BLOOD RIGHT ANTECUBITAL  Final   Special Requests   Final    BOTTLES DRAWN AEROBIC AND ANAEROBIC Blood Culture adequate volume   Culture   Final    NO GROWTH 2 DAYS Performed at [facility], [address], [place], [place] [address]    Report Status PENDING  Incomplete         Radiology Studies: DG Chest 1 View  Result Date: 08/12/2021 CLINICAL DATA:  Lethargic, hypoxia, history of CHF EXAM: CHEST  1 VIEW COMPARISON:  Chest radiograph 07/27/2021 FINDINGS: The heart is enlarged, similar to the prior study. The upper mediastinal contours are stable. There is vascular congestion without definite overt pulmonary interstitial edema. There is no focal consolidation. There is no pleural effusion or pneumothorax. There is no acute osseous abnormality. IMPRESSION: Cardiomegaly with vascular congestion but no definite overt pulmonary edema. Electronically Signed   By: Valetta Mole M.D.   On: 08/12/2021 09:40   CT HEAD WO CONTRAST (5MM)  Result Date: 08/12/2021 CLINICAL DATA:  Mental status change, unknown cause. EXAM: CT HEAD WITHOUT CONTRAST TECHNIQUE: Contiguous axial images were obtained from the base of the skull through the vertex without intravenous contrast. COMPARISON:  07/27/2021 FINDINGS: Brain: No evidence of acute infarction, hemorrhage, hydrocephalus, extra-axial collection or mass lesion/mass effect. Vascular: No hyperdense vessel or  unexpected calcification. Skull: Normal. Negative for fracture or focal lesion. Sinuses/Orbits: No acute finding. Other: None. IMPRESSION: No acute intracranial abnormality. Electronically Signed   By: Markus Daft M.D.   On: 08/12/2021 11:45   CT Chest Wo Contrast  Result Date: 08/12/2021 CLINICAL DATA:  Respiratory illness, nondiagnostic x-ray. EXAM: CT CHEST WITHOUT CONTRAST TECHNIQUE: Multidetector CT imaging of the chest was performed following the standard protocol without IV contrast. COMPARISON:  07/27/2021 FINDINGS: Cardiovascular: Heart size is normal without significant pericardial fluid. Normal caliber of the thoracic aorta. Mediastinum/Nodes: No evidence for chest lymphadenopathy. Esophagus is unremarkable. No significant axillary lymph node enlargement. Thyroid tissue is unremarkable. Lungs/Pleura: Trachea and mainstem bronchi are patent. No pleural effusions. Increased consolidation in the posterior lower lobes bilaterally. Again noted are hazy densities  in the lingula. Upper Abdomen: Cholecystectomy. Again noted is cortical scarring in the left kidney that is only partially imaged. No acute abnormality in the visualized upper abdomen. Musculoskeletal: No acute bone abnormality. IMPRESSION: Increased patchy densities in the posterior lower lobes compared to the previous chest CT. Findings could be related to volume loss but cannot exclude areas of infection. Electronically Signed   By: Markus Daft M.D.   On: 08/12/2021 11:54   CT Angio Chest Pulmonary Embolism (PE) W or WO Contrast  Result Date: 08/13/2021 CLINICAL DATA:  Elevated D-dimer. Shortness of breath. Extravasation of IV contrast during the study. The patient's nurse was notified by the technologist. EXAM: CT ANGIOGRAPHY CHEST WITH CONTRAST TECHNIQUE: Multidetector CT imaging of the chest was performed using the standard protocol during bolus administration of intravenous contrast. Multiplanar CT image reconstructions and MIPs were  obtained to evaluate the vascular anatomy. CONTRAST:  76mL OMNIPAQUE IOHEXOL 350 MG/ML SOLN COMPARISON:  CT scan August 12, 2021 FINDINGS: Cardiovascular: Due to IV contrast extravasation of the study, there is suboptimal opacification of the aorta and pulmonary arteries. Cardiomegaly identified. No obvious coronary artery disease on this study. The thoracic aorta is nonaneurysmal with no identified atherosclerotic change or dissection. Evaluation for pulmonary emboli is limited due to suboptimal opacification. No obvious pulmonary emboli identified. Mediastinum/Nodes: Thyroid and esophagus are normal. No effusions identified. The chest wall is normal. No adenopathy. Lungs/Pleura: Central airways are normal. No pneumothorax. Bibasilar opacities are centrally identical to yesterday's CT scan. No nodules or masses are identified. Upper Abdomen: No acute abnormality. Musculoskeletal: No chest wall abnormality. No acute or significant osseous findings. Review of the MIP images confirms the above findings. IMPRESSION: 1. Evaluation for pulmonary emboli is markedly limited due to suboptimal contrast enhancement secondary to IV contrast extravasation during the study. The study is nondiagnostic beyond the level of the main pulmonary arteries. Even at the level of the main pulmonary arteries, the study is limited. Within these limitations, no emboli are identified. A repeat study could be performed if clinical concern persists. 2. Bibasilar pulmonary opacities are unchanged. The findings could represent atelectasis but pneumonia or aspiration are not excluded. Recommend attention on follow-up. 3. No other significant abnormalities. Electronically Signed   By: Dorise Bullion III M.D.   On: 08/13/2021 12:18   MR LUMBAR SPINE WO CONTRAST  Result Date: 08/12/2021 CLINICAL DATA:  Low back pain, weakness and fatigue EXAM: MRI LUMBAR SPINE WITHOUT CONTRAST TECHNIQUE: Multiplanar, multisequence MR imaging of the lumbar  spine was performed. No intravenous contrast was administered. COMPARISON:  No prior MRI, correlation is made with CT lumbar spine 04/30/2021. FINDINGS: Evaluation is limited, as patient would not complete axial sequences. Segmentation: Standard. Alignment:  No listhesis. Vertebrae:  No acute fracture or suspicious osseous lesion. Conus medullaris and cauda equina: Conus extends to the L2 level. Conus and cauda equina appear normal. Paraspinal and other soft tissues: Negative Disc levels: Evaluation is limited by sagittal only imaging. Within this limitation, mild multilevel degenerative changes, with disc desiccation and small disc bulges, but no significant spinal canal stenosis or neural foraminal narrowing. IMPRESSION: Evaluation is limited as the patient would not complete axial sequences. Within this limitation, no significant spinal canal stenosis or neural foraminal narrowing. Electronically Signed   By: Merilyn Baba M.D.   On: 08/12/2021 22:51        Scheduled Meds:  aspirin EC  81 mg Oral Daily   donepezil  10 mg Oral QHS   DULoxetine  60  mg Oral Daily   enoxaparin (LOVENOX) injection  55 mg Subcutaneous QHS   famotidine  20 mg Oral QHS   insulin aspart  0-15 Units Subcutaneous TID WC   insulin aspart  0-5 Units Subcutaneous QHS   insulin glargine-yfgn  30 Units Subcutaneous QHS   LORazepam  1 mg Intravenous Once   melatonin  2.5 mg Oral QHS   QUEtiapine  50 mg Oral QHS   simvastatin  40 mg Oral QHS   Continuous Infusions:  sodium chloride 75 mL/hr at 08/14/21 0016   ceFEPime (MAXIPIME) IV 2 g (08/14/21 0206)   metronidazole 500 mg (08/13/21 1918)     LOS: 2 days    Time spent: 30 mins     Wyvonnia Dusky, MD [facility] Pager [phone]  If 7PM-7AM, please contact night-coverage 08/14/2021, 7:18 AM

## 2021-08-14 NOTE — Progress Notes (Signed)
SLP Cancellation Note  Patient Details Name: Judith Shaw MRN: [id number] DOB: 03-Dec-1950   Cancelled treatment:       Reason Eval/Treat Not Completed: Fatigue/lethargy limiting ability to participate. Per RN, pt lethargic and unable to rouse for participation in SLP evaluation at this time. Per RN, pt swallowing without difficulty when awake/alert. SLP to continue efforts as appropriate.   Cherrie Gauze, M.S., [facility] [phone] [place])  Quintella Baton 08/14/2021, 3:16 PM

## 2021-08-15 DIAGNOSIS — B379 Candidiasis, unspecified: Secondary | ICD-10-CM

## 2021-08-15 LAB — CBC
HCT: 39.3 % (ref 36.0–46.0)
Hemoglobin: 12.7 g/dL (ref 12.0–15.0)
MCH: 28.6 pg (ref 26.0–34.0)
MCHC: 32.3 g/dL (ref 30.0–36.0)
MCV: 88.5 fL (ref 80.0–100.0)
Platelets: 237 10*3/uL (ref 150–400)
RBC: 4.44 MIL/uL (ref 3.87–5.11)
RDW: 13.4 % (ref 11.5–15.5)
WBC: 6.6 10*3/uL (ref 4.0–10.5)
nRBC: 0 % (ref 0.0–0.2)

## 2021-08-15 LAB — BASIC METABOLIC PANEL
Anion gap: 4 — ABNORMAL LOW (ref 5–15)
BUN: 22 mg/dL (ref 8–23)
CO2: 25 mmol/L (ref 22–32)
Calcium: 8.7 mg/dL — ABNORMAL LOW (ref 8.9–10.3)
Chloride: 110 mmol/L (ref 98–111)
Creatinine, Ser: 0.69 mg/dL (ref 0.44–1.00)
GFR, Estimated: >60 mL/min (ref >60)
Glucose, Bld: 137 mg/dL — ABNORMAL HIGH (ref 70–99)
Potassium: 4.3 mmol/L (ref 3.5–5.1)
Sodium: 139 mmol/L (ref 135–145)

## 2021-08-15 LAB — GLUCOSE, CAPILLARY
Glucose-Capillary: 141 mg/dL — ABNORMAL HIGH (ref 70–99)
Glucose-Capillary: 194 mg/dL — ABNORMAL HIGH (ref 70–99)
Glucose-Capillary: 235 mg/dL — ABNORMAL HIGH (ref 70–99)
Glucose-Capillary: 179 mg/dL — ABNORMAL HIGH (ref 70–99)

## 2021-08-15 MED ORDER — HALOPERIDOL LACTATE 5 MG/ML IJ SOLN
5.0000 mg | Freq: Once | INTRAMUSCULAR | Status: AC
  Administered 2021-08-16: 03:00:00 5 mg via INTRAMUSCULAR
  Filled 2021-08-15: qty 1

## 2021-08-15 MED ORDER — HYDROCORTISONE 0.5 % EX CREA
TOPICAL_CREAM | Freq: Two times a day (BID) | CUTANEOUS | Status: DC
  Filled 2021-08-15: qty 28.35

## 2021-08-15 MED ORDER — HYDRALAZINE HCL 20 MG/ML IJ SOLN: 20.0000 mg | Freq: Four times a day (QID) | INTRAMUSCULAR | Status: DC | PRN

## 2021-08-15 MED ORDER — NYSTATIN-TRIAMCINOLONE 100000-0.1 UNIT/GM-% EX CREA
TOPICAL_CREAM | Freq: Two times a day (BID) | CUTANEOUS | Status: DC
  Filled 2021-08-15: qty 15

## 2021-08-15 MED ORDER — QUETIAPINE FUMARATE 25 MG PO TABS
25.0000 mg | ORAL_TABLET | Freq: Every day | ORAL | Status: DC
  Administered 2021-08-15 – 2021-08-17 (×3): 25 mg via ORAL
  Filled 2021-08-15 (×3): qty 1

## 2021-08-15 MED ORDER — LIDOCAINE 5 % EX PTCH
2.0000 | MEDICATED_PATCH | CUTANEOUS | Status: DC
  Administered 2021-08-15 – 2021-08-17 (×3): 2 via TRANSDERMAL
  Administered 2021-08-18: 10:00:00 1 via TRANSDERMAL
  Filled 2021-08-15 (×4): qty 2

## 2021-08-15 NOTE — Care Management Important Message (Signed)
Important Message  Patient Details  Name: Judith Shaw MRN: [id number] Date of Birth: 05-31-1951   Medicare Important Message Given:  Yes     [name] A Aric Jost 08/15/2021, 2:33 PM

## 2021-08-15 NOTE — Progress Notes (Addendum)
Physical Therapy Treatment Patient Details Name: Judith Shaw MRN: [id number] DOB: [birth year] Today's Date: 08/15/2021   History of Present Illness Pt is a 70 y/o F admitted on 08/12/21 after presenting with hypoxia, hyperkalemia & fatigue. Pt was recently admitted for UTI & d/c to SNF & labs the day prior showed elevated K+. CT of chest shows likely atelectasis but question aspiration. MRI was limited due to pt behavior but limited exam shows no significant spinal canal stenosis or neural foraminal narrowing. PMH: morbid obesity, HTN, DM, dCHF, OSA not on CPAP, mild dementia, uterine CA, anxiety    PT Comments    Pt seen for PT with co-tx with OT. Pt yelling out asking for assistance & grateful upon therapy arrival. Therapists educated pt on use of call bell & cleaned up spilled liquid in floor. Pt requires max assist for supine<>sit & is able to perform 2 sit>stand from EOB with max assist +2 with blocking BLE feet, elevated EOB & cuing/assist for anterior pelvic shift. Pt with poor ability to upright trunk & shift pelvis anteriorly. Attempted lateral scoot to recliner with +2 assist but unable to safely achieve goal 2/2 pt's fear of falling and decreased ability to follow commands. Pt assisted back to bed & assisted with rolling to allow staff to change soiled sheets. Pt begins spitting up secretions frequently at end of session -- nurse aware. Left in care of SLP.  Addendum: Spoke with pt's [name] [phone]) re: pt. [name] reports pt has a hx of dementia with poor short term memory, which was not noted in the chart but is consistent with today's PT session. She is eager for pt to get OOB to recliner or sit EOB as much as possible. PT educated her that nursing staff could assist pt with hoyer lift transfer if pt is safe to sit in recliner & there's no concern about pt falling out of chair. Educated her on PT interventions & recommendations of STR. [name] reports pt was ambulating in the  community without an AD in October but since having urosepsis that worsened her peripheral neuropathy pt has not been able to do more than transfer to a chair with assistance since mid/end of November.       Recommendations for follow up therapy are one component of a multi-disciplinary discharge planning process, led by the attending physician.  Recommendations may be updated based on patient status, additional functional criteria and insurance authorization.  Follow Up Recommendations  Skilled nursing-short term rehab (<3 hours/day)     Assistance Recommended at Discharge Frequent or constant Supervision/Assistance  Equipment Recommendations  None recommended by PT    Recommendations for Other Services       Precautions / Restrictions Precautions Precautions: Fall Restrictions Weight Bearing Restrictions: No     Mobility  Bed Mobility Overal bed mobility: Needs Assistance Bed Mobility: Rolling;Supine to Sit;Sit to Supine Rolling: Min assist (cuing to use bed rails)   Supine to sit: Max assist;+2 for physical assistance Sit to supine: Max assist;+2 for safety/equipment        Transfers Overall transfer level: Needs assistance Equipment used: Rolling walker (2 wheels) Transfers: Sit to/from Stand;Bed to chair/wheelchair/BSC Sit to Stand: Max assist;+2 physical assistance;+2 safety/equipment;From elevated surface          Lateral/Scoot Transfers:  (Attempted lateral scoot bed>drop arm recliner with max<>total assist +2 but pt fearful & pushes back despite max multimodal cuing & attempts to facilitate anterior weight shifting and head/hips relationship. Unsafe to continue  attempts.) General transfer comment: Pt performs sit>stand from slightly elevated EOB x 2 trials with +2 max assist. PT/OT block BLE from slipping & provide assistance for BLE foot placement for normal vs narrow BOS.    Ambulation/Gait                   Stairs             Wheelchair  Mobility    Modified Rankin (Stroke Patients Only)       Balance Overall balance assessment: Needs assistance Sitting-balance support: Bilateral upper extremity supported;Feet supported Sitting balance-Leahy Scale: Poor     Standing balance support: During functional activity;Bilateral upper extremity supported;Reliant on assistive device for balance Standing balance-Leahy Scale: Zero                              Cognition Arousal/Alertness: Awake/alert Behavior During Therapy: Restless Overall Cognitive Status: Impaired/Different from baseline Area of Impairment: Orientation;Memory;Following commands;Safety/judgement;Attention;Problem solving                 Orientation Level: Disoriented to;Time;Situation   Memory: Decreased recall of precautions;Decreased short-term memory Following Commands: Follows one step commands consistently;Follows one step commands with increased time Safety/Judgement: Decreased awareness of safety;Decreased awareness of deficits   Problem Solving: Slow processing;Requires verbal cues;Requires tactile cues General Comments: Pt is AxO to self & location but demonstrates significantly impaired short term memory & no retention of education during session (forgets within seconds)        Exercises      General Comments General comments (skin integrity, edema, etc.): Pt soiled bed with urine with PT/OT assisting with donning clean gown, changing bed sheets. Pt does being to spit up upon supinne & rolling for linen change but appears to only spit up secretions -- nurse aware.      Pertinent Vitals/Pain Pain Assessment: Faces Faces Pain Scale: Hurts even more Pain Location: Pt c/o significant back/hip discomfort when lying in bed & asks to reposition which PT & OT assists with, nurse applies pain patch. Pain Descriptors / Indicators: Discomfort Pain Intervention(s): Monitored during session;Repositioned    Home Living                           Prior Function            PT Goals (current goals can now be found in the care plan section) Acute Rehab PT Goals Patient Stated Goal: get strong, walk, return to PLOF PT Goal Formulation: With patient Time For Goal Achievement: 08/27/21 Potential to Achieve Goals: Fair Progress towards PT goals: Progressing toward goals    Frequency    Min 2X/week      PT Plan Current plan remains appropriate    Co-evaluation PT/OT/SLP Co-Evaluation/Treatment: Yes Reason for Co-Treatment: Complexity of the patient's impairments (multi-system involvement);Necessary to address cognition/behavior during functional activity;For patient/therapist safety;To address functional/ADL transfers PT goals addressed during session: Mobility/safety with mobility;Proper use of DME;Balance        AM-PAC PT "6 Clicks" Mobility   Outcome Measure  Help needed turning from your back to your side while in a flat bed without using bedrails?: A Little Help needed moving from lying on your back to sitting on the side of a flat bed without using bedrails?: Total Help needed moving to and from a bed to a chair (including a wheelchair)?: Total Help needed standing up from a chair  using your arms (e.g., wheelchair or bedside chair)?: Total Help needed to walk in hospital room?: Total Help needed climbing 3-5 steps with a railing? : Total 6 Click Score: 8    End of Session Equipment Utilized During Treatment: Gait belt Activity Tolerance: Patient tolerated treatment well Patient left: in bed;with call bell/phone within reach;with bed alarm set (with SLP present)   PT Visit Diagnosis: Unsteadiness on feet (R26.81);Muscle weakness (generalized) (M62.81);Difficulty in walking, not elsewhere classified (R26.2)     Time: 9597-4718 PT Time Calculation (min) (ACUTE ONLY): 35 min  Charges:  $Therapeutic Activity: 8-22 mins                     Lavone Nian, PT, DPT 08/15/21, 4:18  PM    Waunita Schooner 08/15/2021, 12:44 PM

## 2021-08-15 NOTE — Progress Notes (Addendum)
PROGRESS NOTE    Judith Shaw  NID:[id number] DOB: [birth year] DOA: 08/12/2021 PCP: Pcp, No    Assessment & Plan:   Principal Problem:   Acute encephalopathy Active Problems:   HTN (hypertension)   Diabetes ([place])   Morbid obesity ([place])   Acute hypoxic respiratory failure: etiology unclear. 86% on RA on 12/17. Weaned off of supplemental oxygen while awake but needs supplemental oxygen when sleeping as pt has hx OSA.   Elevated d-dimer: CTA chest show no emboli but markedly limited due to suboptimal contrast enhancement secondary to IV contrast extravasation during the study   Possible pneumonia: as per CT scan. Continue on IV unasyn   Yeast infection: urine cx is growing yeast. Continue on IV fluconazole x2 days more   Generalized weakness: PT/OT recs SNF    Hyperkalemia: resolved    AKI: resolved     DM2: poorly controlled, HbA1c 8.5. Continue on glargine, SSI w/ accuchecks   Low back pain: MRI L spine shows no significant spinal canal stenosis or neural foraminal narrowing but pt did not complete axial sequences      OSA: refuses to use CPAP    Chronic pain: continue to hold home dose of duloxetine, gabapentin    Mild cognitive impairment: CT head shows no acute intracranial findings. Continue on home dose of donepezil, seroquel. Neuro consulted for AMS.    Overactive bladder: hold home dose of myrbetriq  Chronic diastolic CHF: appears euvolemic. Continue to hold home dose of lasix.   Morbid obesity: BMI 41.8. Complicates overall care & prognosis   DVT prophylaxis: lovenox  Code Status: full Family Communication: can pt's sister, [name], no answer so I left a message Disposition Plan: likely d/c back to SNF   Level of care: Telemetry Medical  Status is: Inpatient  Remains inpatient appropriate because: severity of illness      Consultants:  none  Procedures:   Antimicrobials: unasyn   Subjective: Pt c/o feeling uncomfortable in  bed  Objective: Vitals:   08/14/21 1633 08/14/21 2253 08/15/21 0627 08/15/21 0728  BP: 107/64 (!) 121/51 (!) 185/95 (!) 153/78  Pulse: 77 73 79 70  Resp: 16 18 18 18   Temp: 98.4 F (36.9 C) 97.6 F (36.4 C) 98.1 F (36.7 C) 97.9 F (36.6 C)  TempSrc: Oral Oral Oral   SpO2: 91% 93% 98% 96%  Weight:      Height:        Intake/Output Summary (Last 24 hours) at 08/15/2021 0729 Last data filed at 08/14/2021 2253 Gross per 24 hour  Intake 594.78 ml  Output 400 ml  Net 194.78 ml   Filed Weights   08/12/21 1330 08/12/21 1938  Weight: 114 kg 114 kg    Examination:  General exam: Appears calm but uncomfortable  Respiratory system: clear breath sounds b/l Cardiovascular system: S1/S2+. No rubs or clicks  Gastrointestinal system: Abd is soft, NT, obese & hypoactive bowel sounds  Central nervous system: Alert and awake. Moves all extremities Psychiatry: Judgement and insight appear poor. Flat mood and affect     Data Reviewed: I have personally reviewed following labs and imaging studies  CBC: Recent Labs  Lab 08/12/21 0906 08/13/21 0607 08/14/21 0417 08/15/21 0358  WBC 8.1 7.5 6.3 6.6  NEUTROABS 4.8  --   --   --   HGB 14.6 13.9 13.2 12.7  HCT 45.7 43.2 41.3 39.3  MCV 89.6 90.2 90.4 88.5  PLT 284 257 248 144   Basic Metabolic Panel: Recent Labs  Lab 08/12/21 1029 08/13/21 0607 08/14/21 0417 08/15/21 0358  NA 136 138 136 139  K 5.3* 5.0 4.8 4.3  CL 105 109 110 110  CO2 23 24 23 25   GLUCOSE 236* 203* 149* 137*  BUN 62* 53* 37* 22  CREATININE 1.57* 1.16* 0.84 0.69  CALCIUM 9.2 8.9 9.1 8.7*   GFR: Estimated Creatinine Clearance: 81.3 mL/min (by C-G formula based on SCr of 0.69 mg/dL). Liver Function Tests: Recent Labs  Lab 08/12/21 1029 08/13/21 0607  AST 41 38  ALT 35 35  ALKPHOS 116 121  BILITOT 0.6 0.6  PROT 7.3 7.2  ALBUMIN 3.1* 3.0*   No results for input(s): LIPASE, AMYLASE in the last 168 hours. No results for input(s): AMMONIA in the  last 168 hours. Coagulation Profile: No results for input(s): INR, PROTIME in the last 168 hours. Cardiac Enzymes: Recent Labs  Lab 08/12/21 1933  CKTOTAL 378*   BNP (last 3 results) No results for input(s): PROBNP in the last 8760 hours. HbA1C: No results for input(s): HGBA1C in the last 72 hours. CBG: Recent Labs  Lab 08/14/21 1010 08/14/21 1202 08/14/21 1627 08/14/21 2050 08/14/21 2143  GLUCAP 171* 222* 234* 177* 157*   Lipid Profile: No results for input(s): CHOL, HDL, LDLCALC, TRIG, CHOLHDL, LDLDIRECT in the last 72 hours. Thyroid Function Tests: No results for input(s): TSH, T4TOTAL, FREET4, T3FREE, THYROIDAB in the last 72 hours. Anemia Panel: No results for input(s): VITAMINB12, FOLATE, FERRITIN, TIBC, IRON, RETICCTPCT in the last 72 hours. Sepsis Labs: Recent Labs  Lab 08/14/21 0745  PROCALCITON <0.10    Recent Results (from the past 240 hour(s))  Urine Culture     Status: Abnormal   Collection Time: 08/12/21 10:57 AM   Specimen: Urine, Random  Result Value Ref Range Status   Specimen Description   Final    URINE, RANDOM Performed at [facility], [address], [place], [place] [address]    Special Requests   Final    NONE Performed at [facility], [place], [place] [address]    Culture 80,000 COLONIES/mL YEAST (A)  Final   Report Status 08/14/2021 FINAL  Final  Resp Panel by RT-PCR (Flu A&B, Covid) Nasopharyngeal Swab     Status: None   Collection Time: 08/12/21 12:43 PM   Specimen: Nasopharyngeal Swab; Nasopharyngeal(NP) swabs in vial transport medium  Result Value Ref Range Status   SARS Coronavirus 2 by RT PCR NEGATIVE NEGATIVE Final    Comment: (NOTE) SARS-CoV-2 target nucleic acids are NOT DETECTED.  The SARS-CoV-2 RNA is generally detectable in upper respiratory specimens during the acute phase of infection. The lowest concentration of SARS-CoV-2 viral copies this assay can detect is 138 copies/mL. A  negative result does not preclude SARS-Cov-2 infection and should not be used as the sole basis for treatment or other patient management decisions. A negative result may occur with  improper specimen collection/handling, submission of specimen other than nasopharyngeal swab, presence of viral mutation(s) within the areas targeted by this assay, and inadequate number of viral copies(<138 copies/mL). A negative result must be combined with clinical observations, patient history, and epidemiological information. The expected result is Negative.  Fact Sheet for Patients:  EntrepreneurPulse.com.au  Fact Sheet for Healthcare Providers:  IncredibleEmployment.be  This test is no t yet approved or cleared by the [place] FDA and  has been authorized for detection and/or diagnosis of SARS-CoV-2 by FDA under an Emergency Use Authorization (EUA). This EUA will remain  in effect (meaning this test can be used) for the duration of the COVID-19 declaration under Section 564(b)(1) of the Act, 21 U.S.C.section 360bbb-3(b)(1), unless the authorization is terminated  or revoked sooner.       Influenza A by PCR NEGATIVE NEGATIVE Final   Influenza B by PCR NEGATIVE NEGATIVE Final    Comment: (NOTE) The Xpert Xpress SARS-CoV-2/FLU/RSV plus assay is intended as an aid in the diagnosis of influenza from Nasopharyngeal swab specimens and should not be used as a sole basis for treatment. Nasal washings and aspirates are unacceptable for Xpert Xpress SARS-CoV-2/FLU/RSV testing.  Fact Sheet for Patients: EntrepreneurPulse.com.au  Fact Sheet for Healthcare Providers: IncredibleEmployment.be  This test is not yet approved or cleared by the [place] FDA and has been authorized for detection and/or diagnosis of SARS-CoV-2 by FDA under an Emergency Use Authorization (EUA). This EUA will remain in effect (meaning this test can  be used) for the duration of the COVID-19 declaration under Section 564(b)(1) of the Act, 21 U.S.C. section 360bbb-3(b)(1), unless the authorization is terminated or revoked.  Performed at [facility], [address] [place], [place] [address]   Blood culture (routine x 2)     Status: None (Preliminary result)   Collection Time: 08/12/21  7:26 PM   Specimen: BLOOD  Result Value Ref Range Status   Specimen Description BLOOD RIGHT ANTECUBITAL  Final   Special Requests   Final    BOTTLES DRAWN AEROBIC AND ANAEROBIC Blood Culture adequate volume   Culture   Final    NO GROWTH 3 DAYS Performed at [facility], [address], [place], [place] [address]    Report Status PENDING  Incomplete  Blood culture (routine x 2)     Status: None (Preliminary result)   Collection Time: 08/12/21  8:18 PM   Specimen: BLOOD  Result Value Ref Range Status   Specimen Description BLOOD RIGHT ANTECUBITAL  Final   Special Requests   Final    BOTTLES DRAWN AEROBIC AND ANAEROBIC Blood Culture adequate volume   Culture   Final    NO GROWTH 3 DAYS Performed at [facility], [address], [place], [place] [address]    Report Status PENDING  Incomplete         Radiology Studies: CT Angio Chest Pulmonary Embolism (PE) W or WO Contrast  Result Date: 08/13/2021 CLINICAL DATA:  Elevated D-dimer. Shortness of breath. Extravasation of IV contrast during the study. The patient's nurse was notified by the technologist. EXAM: CT ANGIOGRAPHY CHEST WITH CONTRAST TECHNIQUE: Multidetector CT imaging of the chest was performed using the standard protocol during bolus administration of intravenous contrast. Multiplanar CT image reconstructions and MIPs were obtained to evaluate the vascular anatomy. CONTRAST:  38mL OMNIPAQUE IOHEXOL 350 MG/ML SOLN COMPARISON:  CT scan August 12, 2021 FINDINGS: Cardiovascular: Due to IV contrast extravasation of the study, there is suboptimal  opacification of the aorta and pulmonary arteries. Cardiomegaly identified. No obvious coronary artery disease on this study. The thoracic aorta is nonaneurysmal with no identified atherosclerotic change or dissection. Evaluation for pulmonary emboli is limited due to suboptimal opacification. No obvious pulmonary emboli identified. Mediastinum/Nodes: Thyroid and esophagus are normal. No effusions identified. The chest wall is normal. No adenopathy. Lungs/Pleura: Central airways are normal. No pneumothorax. Bibasilar opacities are centrally identical to yesterday's CT scan. No nodules or masses are identified. Upper Abdomen: No acute abnormality. Musculoskeletal: No chest wall abnormality. No acute or significant osseous findings. Review of the MIP  images confirms the above findings. IMPRESSION: 1. Evaluation for pulmonary emboli is markedly limited due to suboptimal contrast enhancement secondary to IV contrast extravasation during the study. The study is nondiagnostic beyond the level of the main pulmonary arteries. Even at the level of the main pulmonary arteries, the study is limited. Within these limitations, no emboli are identified. A repeat study could be performed if clinical concern persists. 2. Bibasilar pulmonary opacities are unchanged. The findings could represent atelectasis but pneumonia or aspiration are not excluded. Recommend attention on follow-up. 3. No other significant abnormalities. Electronically Signed   By: Dorise Bullion III M.D.   On: 08/13/2021 12:18        Scheduled Meds:  aspirin EC  81 mg Oral Daily   donepezil  10 mg Oral QHS   enoxaparin (LOVENOX) injection  55 mg Subcutaneous QHS   famotidine  20 mg Oral QHS   fluconazole  150 mg Oral Daily   insulin aspart  0-15 Units Subcutaneous TID WC   insulin aspart  0-5 Units Subcutaneous QHS   insulin glargine-yfgn  30 Units Subcutaneous QHS   melatonin  2.5 mg Oral QHS   simvastatin  40 mg Oral QHS   Continuous  Infusions:  sodium chloride 75 mL/hr at 08/14/21 2055   ampicillin-sulbactam (UNASYN) IV 3 g (08/15/21 0307)     LOS: 3 days    Time spent: 32 mins     Wyvonnia Dusky, MD [facility] Pager [phone]  If 7PM-7AM, please contact night-coverage 08/15/2021, 7:29 AM

## 2021-08-15 NOTE — Plan of Care (Signed)

## 2021-08-15 NOTE — Progress Notes (Signed)
Occupational Therapy Treatment Patient Details Name: Judith Shaw MRN: [id number] DOB: May 13, 1951 Today's Date: 08/15/2021   History of present illness Pt is a 70 y/o F admitted on 08/12/21 after presenting with hypoxia, hyperkalemia & fatigue. Pt was recently admitted for UTI & d/c to SNF & labs the day prior showed elevated K+. CT of chest shows likely atelectasis but question aspiration. MRI was limited due to pt behavior but limited exam shows no significant spinal canal stenosis or neural foraminal narrowing. PMH: morbid obesity, HTN, DM, dCHF, OSA not on CPAP, mild dementia, uterine CA, anxiety   OT comments  Pt seen for OT tx this date, Co-tx completed with PT d/t low fxl activity tolerance. Pt presents this date with significant confusion, only retaining short term/new education for ~5-10 seconds before forgetting and asking a similar question such as "who are you all?", "what are we going to do?", and "who are you with?" Multiple times. Pt agreeable to session, requires increased processing time and cues throughout. Pt comes to sitting with MAX A +2 and requires MAX A + 2 for two standing trials (able to clear bottom, but lower extremities still bracing on bed and pt with very narrow BOS and unable to space stance out without assistance from PT). PT/OT block pt's knees. She is unable to sequence scoot transfers and therefore is unable to be transferred to drop arm recliner. She is returned to bed and requires MOD A +2 to change linens and attend to peri care d/t being soiled with urine. She is left with all needs met and in reach and SLP presenting to start session. Will continue to follow.    Recommendations for follow up therapy are one component of a multi-disciplinary discharge planning process, led by the attending physician.  Recommendations may be updated based on patient status, additional functional criteria and insurance authorization.    Follow Up Recommendations  Skilled  nursing-short term rehab (<3 hours/day)    Assistance Recommended at Discharge Frequent or constant Supervision/Assistance  Equipment Recommendations  Other (comment)    Recommendations for Other Services      Precautions / Restrictions Precautions Precautions: Fall Restrictions Weight Bearing Restrictions: No       Mobility Bed Mobility Overal bed mobility: Needs Assistance Bed Mobility: Rolling;Supine to Sit;Sit to Supine Rolling: Mod assist;+2 for safety/equipment   Supine to sit: Max assist;+2 for physical assistance Sit to supine: Max assist;+2 for safety/equipment        Transfers Overall transfer level: Needs assistance Equipment used: Rolling walker (2 wheels) Transfers: Sit to/from Stand;Bed to chair/wheelchair/BSC Sit to Stand: Max assist;+2 physical assistance;+2 safety/equipment;From elevated surface          Lateral/Scoot Transfers:  (Attempted lateral scoot bed>drop arm recliner with max<>total assist +2 but pt fearful & pushes back despite max multimodal cuing & attempts to facilitate anterior weight shifting and head/hips relationship. Unsafe to continue attempts.) General transfer comment: Pt performs sit>stand from slightly elevated EOB x 2 trials with +2 max assist. PT/OT block BLE from slipping & provide assistance for BLE foot placement for normal vs narrow BOS. Unable to sequence scooting     Balance Overall balance assessment: Needs assistance Sitting-balance support: Bilateral upper extremity supported;Feet supported Sitting balance-Leahy Scale: Poor Sitting balance - Comments: P seated EOB   Standing balance support: During functional activity;Bilateral upper extremity supported;Reliant on assistive device for balance Standing balance-Leahy Scale: Zero Standing balance comment: MAX A +2 for attempted stand that essentially isn't functional because pt is  bracing on the bed and isn't able to full extend.                            ADL either performed or assessed with clinical judgement   ADL Overall ADL's : Needs assistance/impaired                             Toileting- Clothing Manipulation and Hygiene: Maximal assistance;+2 for physical assistance;Bed level Toileting - Clothing Manipulation Details (indicate cue type and reason): rolling in bed d/t soiled with urine            Extremity/Trunk Assessment              Vision       Perception     Praxis      Cognition Arousal/Alertness: Awake/alert Behavior During Therapy: Restless Overall Cognitive Status: Impaired/Different from baseline Area of Impairment: Orientation;Memory;Following commands;Safety/judgement;Attention;Problem solving                 Orientation Level: Disoriented to;Time;Situation   Memory: Decreased recall of precautions;Decreased short-term memory Following Commands: Follows one step commands consistently;Follows one step commands with increased time Safety/Judgement: Decreased awareness of safety;Decreased awareness of deficits   Problem Solving: Slow processing;Requires verbal cues;Requires tactile cues General Comments: Pt is AxO to self & location but demonstrates significantly impaired short term memory & no retention of education during session (forgets within seconds)          Exercises Other Exercises Other Exercises: ed with pt re: role of OT, imporatnce of OOB activity, GOC. Pt with very poor memory retention, forgetting within ~5-10 seconds each aspect of education provided.   Shoulder Instructions       General Comments Pt soiled bed with urine with PT/OT assisting with donning clean gown, changing bed sheets. Pt does being to spit up upon supinne & rolling for linen change but appears to only spit up secretions -- nurse aware.    Pertinent Vitals/ Pain       Pain Assessment: Faces Faces Pain Scale: Hurts even more Pain Location: back pain while laying Pain Descriptors /  Indicators: Discomfort;Grimacing Pain Intervention(s): Limited activity within patient's tolerance;Monitored during session;Repositioned  Home Living                                          Prior Functioning/Environment              Frequency  Min 2X/week        Progress Toward Goals  OT Goals(current goals can now be found in the care plan section)  Progress towards OT goals: Progressing toward goals  Acute Rehab OT Goals Patient Stated Goal: to get stronger OT Goal Formulation: With patient Time For Goal Achievement: 08/27/21 Potential to Achieve Goals: Good  Plan Discharge plan remains appropriate;Frequency remains appropriate    Co-evaluation    PT/OT/SLP Co-Evaluation/Treatment: Yes Reason for Co-Treatment: Complexity of the patient's impairments (multi-system involvement);For patient/therapist safety;To address functional/ADL transfers;Necessary to address cognition/behavior during functional activity PT goals addressed during session: Mobility/safety with mobility;Balance;Proper use of DME OT goals addressed during session: ADL's and self-care;Proper use of Adaptive equipment and DME      AM-PAC OT "6 Clicks" Daily Activity     Outcome Measure   Help from another person eating meals?:  None Help from another person taking care of personal grooming?: A Little Help from another person toileting, which includes using toliet, bedpan, or urinal?: A Lot Help from another person bathing (including washing, rinsing, drying)?: Total Help from another person to put on and taking off regular upper body clothing?: A Lot Help from another person to put on and taking off regular lower body clothing?: Total 6 Click Score: 13    End of Session Equipment Utilized During Treatment: Gait belt;Rolling walker (2 wheels)  OT Visit Diagnosis: Unsteadiness on feet (R26.81);Muscle weakness (generalized) (M62.81)   Activity Tolerance Patient tolerated treatment  well   Patient Left with call bell/phone within reach;with family/visitor present;in bed;with bed alarm set   Nurse Communication Mobility status        Time: 7829-5621 OT Time Calculation (min): 33 min  Charges: OT General Charges $OT Visit: 1 Visit OT Treatments $Self Care/Home Management : 8-22 mins  Gerrianne Scale, MS, OTR/L ascom [phone] 08/15/21, 1:47 PM

## 2021-08-15 NOTE — Evaluation (Signed)
Clinical/Bedside Swallow Evaluation Patient Details  Name: Judith Shaw MRN: [id number] Date of Birth: 01-05-1951  Today's Date: 08/15/2021 Time: SLP Start Time (ACUTE ONLY): 62 SLP Stop Time (ACUTE ONLY): 1287 SLP Time Calculation (min) (ACUTE ONLY): 15 min  Past Medical History:  Past Medical History:  Diagnosis Date   Anxiety    Cancer ([place])    pt states hx of uterine cancer and had a complete hyst   CHF (congestive heart failure) ([place])    Depression    Diabetes mellitus without complication ([place])    Hyperlipidemia    Hypertension    Past Surgical History:  Past Surgical History:  Procedure Laterality Date   ABDOMINAL HYSTERECTOMY     AMPUTATION Left 12/19/2019   Procedure: AMPUTATION RAY Left 5th;  Surgeon: Caroline More, DPM;  Location: [facility];  Service: Podiatry;  Laterality: Left;   CHOLECYSTECTOMY     LOWER EXTREMITY ANGIOGRAPHY Left 12/17/2019   Procedure: Lower Extremity Angiography;  Surgeon: Algernon Huxley, MD;  Location: [place] [facility];  Service: Cardiovascular;  Laterality: Left;   HPI:  Per Physician's H&P "Judith Shaw is a 70 y.o. female with medical history significant for morbid obesity, htn, dm, dchf, osa not on cpap, mild dementia, who presents with above.     Sister reports gradual decline worse past 6 months. Recently admitted for uti and discharged to snf. Sister reports several days of weakness/fatigue. Yesterday labs at snf reportedly showed potassium of 6. Her po has not been good. She was given kayexalate. Sister thinks may choke on foot. No fevers or cough. No abdominal or other pain. Denies dysuria. No vomiting or diarrhea. Does have chronic low back pain. Has not been able to do much with rehab.     ED Course:      Fluids, abx, labs, imaging. O2 82% improved to normal w/ 2 L"    Assessment / Plan / Recommendation  Clinical Impression  Pt seen for clinical swallowing evaluation. Pt alert, pleasant, and cooperative. Waxing/waning mental status with  periods of confusion. Working with OT/PT upon SLP entrance to room. After repositioning efforts with OT/PT assistance, pt expectorated a moderate amount of clear thick secretions. Pt reports nasal congestion and increased sinus drainage. RN made aware.   Pt given trials of thin liquids, pureed, and solid. Pt present with s/sx mild oral dysphagia c/b mildly prolonged mastication likely due to generalized oral weakness and mental status. Pharyngeal swallow appeared [facility] per clinical assessment. To palpation, pt with seemingly timely swallow initiation and seemingly adequate laryngeal elevation. Vocal quality remained strong/clear across trials.   Recommend diet upgrade to mech soft diet with thin liquids with safe swallowing strategies as outlined below. Pt with mildly increased risk for aspiration/aspiration PNA given mental status and multiple medical comorbidities. Risk appears to be reduced with altered consistency diet and safe swallowing strategies / aspiration precuations.   SLP to f/u x1 for diet tolerance.   Pt may benefit from cognitive-linguistic evaluation at next level of care in functional/home environment given fluctuations in overall mental status.   Pt and RN made aware of results of assessment, diet recommendations, safe swallowing strategies/aspiration precautions and SLP POC. Pt verbalized understanding/agreement; however, suspect need for reinforcement of content given reduced functional recall noted during informal conversational exchanges.  SLP Visit Diagnosis: Dysphagia, oral phase (R13.11)    Aspiration Risk  Mild aspiration risk    Diet Recommendation Dysphagia 3 (Mech soft);Thin liquid   Medication Administration:  (as tolerated) Supervision:  Patient able to self feed (set up) Compensations: Minimize environmental distractions;Slow rate;Small sips/bites Postural Changes: Seated upright at 90 degrees    Other  Recommendations Oral Care Recommendations: Oral care BID     Recommendations for follow up therapy are one component of a multi-disciplinary discharge planning process, led by the attending physician.  Recommendations may be updated based on patient status, additional functional criteria and insurance authorization.  Follow up Recommendations Skilled nursing-short term rehab (<3 hours/day)      Assistance Recommended at Discharge Frequent or constant Supervision/Assistance  Functional Status Assessment Patient has had a recent decline in their functional status and demonstrates the ability to make significant improvements in function in a reasonable and predictable amount of time.  Frequency and Duration min 2x/week  2 weeks       Prognosis Prognosis for Safe Diet Advancement: Fair Barriers to Reach Goals: Cognitive deficits;Behavior      Swallow Study   General Date of Onset: 08/12/21 HPI: Per 33 H&P "Judith Shaw is a 70 y.o. female with medical history significant for morbid obesity, htn, dm, dchf, osa not on cpap, mild dementia, who presents with above.     Sister reports gradual decline worse past 6 months. Recently admitted for uti and discharged to snf. Sister reports several days of weakness/fatigue. Yesterday labs at snf reportedly showed potassium of 6. Her po has not been good. She was given kayexalate. Sister thinks may choke on foot. No fevers or cough. No abdominal or other pain. Denies dysuria. No vomiting or diarrhea. Does have chronic low back pain. Has not been able to do much with rehab.     ED Course:      Fluids, abx, labs, imaging. O2 82% improved to normal w/ 2 L" Type of Study: Bedside Swallow Evaluation Previous Swallow Assessment: unknown Diet Prior to this Study: Dysphagia 2 (chopped);Thin liquids Temperature Spikes Noted: No Respiratory Status: Room air History of Recent Intubation: No Behavior/Cognition: Alert;Cooperative;Pleasant mood;Requires cueing;Distractible;Confused Oral Cavity Assessment: Within  Functional Limits Oral Care Completed by SLP: Yes Oral Cavity - Dentition: Adequate natural dentition Vision: Functional for self-feeding Self-Feeding Abilities: Able to feed self;Needs set up Patient Positioning: Upright in bed Baseline Vocal Quality: Normal Volitional Cough: Strong Volitional Swallow: Able to elicit    Oral/Motor/Sensory Function Overall Oral Motor/Sensory Function: Generalized oral weakness   Thin Liquid Thin Liquid: Within functional limits Presentation: Cup;Spoon;Self Fed Other Comments: ~6 oz total    Puree Puree: Within functional limits Presentation: Self Fed Other Comments: ~4 oz total   Solid     Solid: Impaired Presentation: Self Fed Oral Phase Impairments: Impaired mastication Oral Phase Functional Implications: Impaired mastication Other Comments: mildly prolonged mastication likely due to generalized oral weakness and mental status     Cherrie Gauze, M.S., [facility] [phone] (ASCOM)  Clearnce Sorrel Ridhaan Dreibelbis 08/15/2021,1:51 PM

## 2021-08-16 DIAGNOSIS — J690 Pneumonitis due to inhalation of food and vomit: Principal | ICD-10-CM

## 2021-08-16 LAB — GLUCOSE, CAPILLARY
Glucose-Capillary: 183 mg/dL — ABNORMAL HIGH (ref 70–99)
Glucose-Capillary: 178 mg/dL — ABNORMAL HIGH (ref 70–99)
Glucose-Capillary: 256 mg/dL — ABNORMAL HIGH (ref 70–99)
Glucose-Capillary: 207 mg/dL — ABNORMAL HIGH (ref 70–99)

## 2021-08-16 LAB — BASIC METABOLIC PANEL
Anion gap: 6 (ref 5–15)
BUN: 17 mg/dL (ref 8–23)
CO2: 25 mmol/L (ref 22–32)
Calcium: 8.7 mg/dL — ABNORMAL LOW (ref 8.9–10.3)
Chloride: 106 mmol/L (ref 98–111)
Creatinine, Ser: 0.88 mg/dL (ref 0.44–1.00)
GFR, Estimated: >60 mL/min (ref >60)
Glucose, Bld: 187 mg/dL — ABNORMAL HIGH (ref 70–99)
Potassium: 4.6 mmol/L (ref 3.5–5.1)
Sodium: 137 mmol/L (ref 135–145)

## 2021-08-16 LAB — CBC
HCT: 39.1 % (ref 36.0–46.0)
Hemoglobin: 12.8 g/dL (ref 12.0–15.0)
MCH: 29.2 pg (ref 26.0–34.0)
MCHC: 32.7 g/dL (ref 30.0–36.0)
MCV: 89.3 fL (ref 80.0–100.0)
Platelets: 239 10*3/uL (ref 150–400)
RBC: 4.38 MIL/uL (ref 3.87–5.11)
RDW: 13.2 % (ref 11.5–15.5)
WBC: 7.4 10*3/uL (ref 4.0–10.5)
nRBC: 0 % (ref 0.0–0.2)

## 2021-08-16 MED ORDER — ACETAMINOPHEN 325 MG PO TABS
650.0000 mg | ORAL_TABLET | Freq: Once | ORAL | Status: AC
  Administered 2021-08-16: 06:00:00 650 mg via ORAL
  Filled 2021-08-16: qty 2

## 2021-08-16 MED ORDER — HYDROXYZINE HCL 25 MG PO TABS
25.0000 mg | ORAL_TABLET | Freq: Once | ORAL | Status: AC
  Administered 2021-08-16: 06:00:00 25 mg via ORAL
  Filled 2021-08-16: qty 1

## 2021-08-16 NOTE — Progress Notes (Addendum)
SLP Follow Up Note  Patient Details Name: Judith Shaw MRN: [id number] DOB: July 27, 1951  Pt's current diet appear appropriate at this time and pt's nurse reports that pt is taking her medicine without any s/s of aspiration or dysphagia. Nursing will re-consulted ST should pt experience decline in ability.   Recommend strict adherence to aspiration precautions when sedating medications are administered.   Judith Shaw M.S., CCC-SLP, [place] Office [phone]   Judith Shaw 08/16/2021, 1:06 PM

## 2021-08-16 NOTE — Progress Notes (Signed)
Physical Therapy Treatment Patient Details Name: Judith Shaw MRN: [id number] DOB: 1951-01-02 Today's Date: 08/16/2021   History of Present Illness Pt is a 70 y/o F admitted on 08/12/21 after presenting with hypoxia, hyperkalemia & fatigue. Pt was recently admitted for UTI & d/c to SNF & labs the day prior showed elevated K+. CT of chest shows likely atelectasis but question aspiration. MRI was limited due to pt behavior but limited exam shows no significant spinal canal stenosis or neural foraminal narrowing. PMH: morbid obesity, HTN, DM, dCHF, OSA not on CPAP, mild dementia, uterine CA, anxiety    PT Comments    Transferred bed/chair via hoyer lift to assess tolerance for positioning, mechanics of lift; rolling bilat, mod assist +2 for placement of sling.  Dep for use of lift.  Tolerated position very comfortably; positioned in chair with alarm in place, needs in reach, neutral sitting position.  Patient pleased with OOB positioning.  RN at bedside throughout session for education in placing/removing sling, setting up and utilizing lift; also present to observe transfer and patient's tolerance to transfer.  Moving forward, goal for OOB to chair daily (nursing to utilize lift, therapy to progress transfers as able).  RN, CNA, Scientist, physiological and therapy in agreement and to coordinate care for subsequent sessions.     Recommendations for follow up therapy are one component of a multi-disciplinary discharge planning process, led by the attending physician.  Recommendations may be updated based on patient status, additional functional criteria and insurance authorization.  Follow Up Recommendations  Skilled nursing-short term rehab (<3 hours/day)     Assistance Recommended at Discharge Frequent or constant Supervision/Assistance  Equipment Recommendations       Recommendations for Other Services       Precautions / Restrictions Precautions Precautions: Fall Restrictions Weight  Bearing Restrictions: No     Mobility  Bed Mobility Overal bed mobility: Needs Assistance Bed Mobility: Rolling Rolling: Mod assist;+2 for physical assistance         General bed mobility comments: step by step cuing for UE/LE placement to actively assist with rolling in bed    Transfers Overall transfer level: Needs assistance   Transfers: Bed to chair/wheelchair/BSC               Transfer via Lift Equipment:  (Viking lift)  Ambulation/Gait                   Marine scientist [place] (Stroke Patients Only)       Balance                                            Cognition Arousal/Alertness: Awake/alert Behavior During Therapy: WFL for tasks assessed/performed Overall Cognitive Status: No family/caregiver present to determine baseline cognitive functioning                                 General Comments: Alert and oriented to self; tangential in speech at times.  Does follow simple commands, but with limited recall/retention of new information and cuing.        Exercises Other Exercises Other Exercises: Rolling bilat, mod assist +2 for rotation (step by step cuing for hand/foot placement to faciltiate active participation  with movement task); dep assist for hygiene, peri-care and linen change after incontinent bladder episode. Other Exercises: Transferred bed/chair via hoyer lift to assess tolerance for positioning, mechanics of lift; rolling bilat, mod assist +2 for placement of sling.  Dep for use of lift.  Tolerated position very comfortably; positioned in chair with alarm in place, needs in reach, neutral sitting position.    General Comments        Pertinent Vitals/Pain Pain Assessment: No/denies pain    Home Living                          Prior Function            PT Goals (current goals can now be found in the care plan section) Acute Rehab PT  Goals Patient Stated Goal: get strong, walk, return to PLOF PT Goal Formulation: With patient Time For Goal Achievement: 08/27/21 Potential to Achieve Goals: Fair Progress towards PT goals: Progressing toward goals    Frequency    Min 2X/week      PT Plan Current plan remains appropriate    Co-evaluation              AM-PAC PT "6 Clicks" Mobility   Outcome Measure  Help needed turning from your back to your side while in a flat bed without using bedrails?: A Lot Help needed moving from lying on your back to sitting on the side of a flat bed without using bedrails?: Total Help needed moving to and from a bed to a chair (including a wheelchair)?: Total Help needed standing up from a chair using your arms (e.g., wheelchair or bedside chair)?: Total Help needed to walk in hospital room?: Total Help needed climbing 3-5 steps with a railing? : Total 6 Click Score: 7    End of Session   Activity Tolerance: Patient tolerated treatment well Patient left: in chair;with call bell/phone within reach;with chair alarm set Nurse Communication: Mobility status PT Visit Diagnosis: Unsteadiness on feet (R26.81);Muscle weakness (generalized) (M62.81);Difficulty in walking, not elsewhere classified (R26.2)     Time: 1610-9604 PT Time Calculation (min) (ACUTE ONLY): 28 min  Charges:  $Therapeutic Activity: 23-37 mins                     Cashel Bellina H. Owens Shark, PT, DPT, NCS 08/16/21, 4:45 PM [phone]

## 2021-08-16 NOTE — Progress Notes (Signed)
Inpatient Diabetes Program Recommendations  AACE/ADA: New Consensus Statement on Inpatient Glycemic Control (2015)  Target Ranges:  Prepandial:   less than 140 mg/dL      Peak postprandial:   less than 180 mg/dL (1-2 hours)      Critically ill patients:  140 - 180 mg/dL   Lab Results  Component Value Date   GLUCAP 179 (H) 08/15/2021   HGBA1C 8.5 (H) 07/29/2021    Review of Glycemic Control  Latest Reference Range & Units 08/15/21 07:30 08/15/21 12:40 08/15/21 19:36 08/15/21 21:24  Glucose-Capillary 70 - 99 mg/dL 141 (H) 194 (H) 235 (H) 179 (H)   Diabetes history: DM 2 Outpatient Diabetes medications: Novolog 0-15 units tid, Tresiba 32 units qpm, Trulicity 4.5 mg weekly Current orders for Inpatient glycemic control:  Semglee 30 units qhs, Novolog 0-15 units tid + hs  Inpatient Diabetes Program Recommendations:    Glucose trends increase postprandially  -  Consider adding Novolog 3 units tid meal coverage if pt eating at least 50% of meals  Thanks,  Tama Headings RN, MSN, BC-ADM Inpatient Diabetes Coordinator Team Pager [phone] (8a-5p)

## 2021-08-16 NOTE — Progress Notes (Signed)
PROGRESS NOTE   HPI was taken from Dr. Si Raider: Judith Shaw is a 70 y.o. female with medical history significant for morbid obesity, htn, dm, dchf, osa not on cpap, mild dementia, who presents with above.   Sister reports gradual decline worse past 6 months. Recently admitted for uti and discharged to snf. Sister reports several days of weakness/fatigue. Yesterday labs at snf reportedly showed potassium of 6. Her po has not been good. She was given kayexalate. Sister thinks may choke on foot. No fevers or cough. No abdominal or other pain. Denies dysuria. No vomiting or diarrhea. Does have chronic low back pain. Has not been able to do much with rehab.   ED Course:    Fluids, abx, labs, imaging. O2 82% improved to normal w/ 2 L   Hospital course from Dr. Jimmye Norman 12/17-12/20/22: Pt initially presented w/ acute hypoxic respiratory failure likely secondary to aspiration pneumonia. Pt has since been weaned off of oxygen during the day but uses oxygen at night as pt has OSA but refuses to wear CPAP. Pt still on IV unasyn for aspiration pneumonia. Of note, pt was found to have yeast growing in her urine cx and was given fluconazole x 3 days total. Furthermore, pt's mental status as waxed and waned throughout hospital stay. Pt does have hx of mild cognitive impairment and previously taking donepezil & seroquel but neuro was consulted to make sure nothing else neurologically was going with the pt (neuro consulted: Dr. Lorrene Reid). PT/OT evaluated the pt and recs SNF.     Judith Shaw  WFU:[id number] DOB: [birth year] DOA: 08/12/2021 PCP: Pcp, No    Assessment & Plan:   Principal Problem:   Acute encephalopathy Active Problems:   HTN (hypertension)   Diabetes ([place])   Morbid obesity ([place])   Acute hypoxic respiratory failure: etiology unclear. 86% on RA on 12/17. Weaned off of supplemental oxygen while awake but needs supplemental oxygen when sleeping as pt has hx OSA.   Elevated d-dimer: CTA  chest show no emboli but markedly limited due to suboptimal contrast enhancement secondary to IV contrast extravasation during the study   Possible pneumonia: as per CT scan. Continue on IV unasy  Yeast infection: urine cx is growing yeast. Completed fluconazole course   Generalized weakness: PT/OT recs SNF    Hyperkalemia: resolved    AKI: resolved     DM2: poorly controlled, HbA1c 8.5. Continue on glargine, SSI w/ accuchecks   Low back pain: MRI L spine shows no significant spinal canal stenosis or neural foraminal narrowing but pt did not complete axial sequences      OSA: refuses CPAP    Chronic pain: continue to hold home dose of duloxetine, gabapentin    Mild cognitive impairment: CT head shows no acute intracranial findings. Continue on home dose of donepezil, seroquel. Neuro consulted for AMS.    Overactive bladder: hold home dose of myrbetriq  Chronic diastolic CHF: appears euvolemic. Will restart home dose of lasix   Morbid obesity: BMI 41.8. Complicates overall care & prognosis\  DVT prophylaxis: lovenox  Code Status: full Family Communication: discussed pt's care w/ pt's sister, Judith Shaw, and answered her questions  Disposition Plan: likely d/c back to SNF   Level of care: Telemetry Medical  Status is: Inpatient  Remains inpatient appropriate because: severity of illness      Consultants:  Neuro, Dr. Lysle Rubens   Procedures:   Antimicrobials: unasyn   Subjective: Pt is very lethargic today   Objective:  Vitals:   08/15/21 0627 08/15/21 0728 08/15/21 1239 08/15/21 2121  BP: (!) 185/95 (!) 153/78 (!) 187/89 (!) 156/74  Pulse: 79 70 77 78  Resp: 18 18 18 17   Temp: 98.1 F (36.7 C) 97.9 F (36.6 C) 98.6 F (37 C) (!) 97.5 F (36.4 C)  TempSrc: Oral     SpO2: 98% 96% 96% 97%  Weight:      Height:        Intake/Output Summary (Last 24 hours) at 08/16/2021 5462 Last data filed at 08/15/2021 2200 Gross per 24 hour  Intake 600 ml  Output --   Net 600 ml   Filed Weights   08/12/21 1330 08/12/21 1938  Weight: 114 kg 114 kg    Examination:  General exam: Appears lethargic  Respiratory system: diminished breath sounds b/l  Cardiovascular system: S1/S2+. No rubs or gallops  Gastrointestinal system: Abd is soft, NT, obese & hypoactive bowel sounds  Central nervous system: Lethargic Moves all extremities  Psychiatry: judgement and insight appear poor. Flat mood and affect    Data Reviewed: I have personally reviewed following labs and imaging studies  CBC: Recent Labs  Lab 08/12/21 0906 08/13/21 0607 08/14/21 0417 08/15/21 0358 08/16/21 0510  WBC 8.1 7.5 6.3 6.6 7.4  NEUTROABS 4.8  --   --   --   --   HGB 14.6 13.9 13.2 12.7 12.8  HCT 45.7 43.2 41.3 39.3 39.1  MCV 89.6 90.2 90.4 88.5 89.3  PLT 284 257 248 237 703   Basic Metabolic Panel: Recent Labs  Lab 08/12/21 1029 08/13/21 0607 08/14/21 0417 08/15/21 0358 08/16/21 0510  NA 136 138 136 139 137  K 5.3* 5.0 4.8 4.3 4.6  CL 105 109 110 110 106  CO2 23 24 23 25 25   GLUCOSE 236* 203* 149* 137* 187*  BUN 62* 53* 37* 22 17  CREATININE 1.57* 1.16* 0.84 0.69 0.88  CALCIUM 9.2 8.9 9.1 8.7* 8.7*   GFR: Estimated Creatinine Clearance: 73.9 mL/min (by C-G formula based on SCr of 0.88 mg/dL). Liver Function Tests: Recent Labs  Lab 08/12/21 1029 08/13/21 0607  AST 41 38  ALT 35 35  ALKPHOS 116 121  BILITOT 0.6 0.6  PROT 7.3 7.2  ALBUMIN 3.1* 3.0*   No results for input(s): LIPASE, AMYLASE in the last 168 hours. No results for input(s): AMMONIA in the last 168 hours. Coagulation Profile: No results for input(s): INR, PROTIME in the last 168 hours. Cardiac Enzymes: Recent Labs  Lab 08/12/21 1933  CKTOTAL 378*   BNP (last 3 results) No results for input(s): PROBNP in the last 8760 hours. HbA1C: No results for input(s): HGBA1C in the last 72 hours. CBG: Recent Labs  Lab 08/14/21 2143 08/15/21 0730 08/15/21 1240 08/15/21 1936 08/15/21 2124   GLUCAP 157* 141* 194* 235* 179*   Lipid Profile: No results for input(s): CHOL, HDL, LDLCALC, TRIG, CHOLHDL, LDLDIRECT in the last 72 hours. Thyroid Function Tests: No results for input(s): TSH, T4TOTAL, FREET4, T3FREE, THYROIDAB in the last 72 hours. Anemia Panel: No results for input(s): VITAMINB12, FOLATE, FERRITIN, TIBC, IRON, RETICCTPCT in the last 72 hours. Sepsis Labs: Recent Labs  Lab 08/14/21 0745  PROCALCITON <0.10    Recent Results (from the past 240 hour(s))  Urine Culture     Status: Abnormal   Collection Time: 08/12/21 10:57 AM   Specimen: Urine, Random  Result Value Ref Range Status   Specimen Description   Final    URINE, RANDOM Performed at [facility]  [facility], [address], [place], [place] [address]    Special Requests   Final    NONE Performed at [facility], [place], [place] [address]    Culture 80,000 COLONIES/mL YEAST (A)  Final   Report Status 08/14/2021 FINAL  Final  Resp Panel by RT-PCR (Flu A&B, Covid) Nasopharyngeal Swab     Status: None   Collection Time: 08/12/21 12:43 PM   Specimen: Nasopharyngeal Swab; Nasopharyngeal(NP) swabs in vial transport medium  Result Value Ref Range Status   SARS Coronavirus 2 by RT PCR NEGATIVE NEGATIVE Final    Comment: (NOTE) SARS-CoV-2 target nucleic acids are NOT DETECTED.  The SARS-CoV-2 RNA is generally detectable in upper respiratory specimens during the acute phase of infection. The lowest concentration of SARS-CoV-2 viral copies this assay can detect is 138 copies/mL. A negative result does not preclude SARS-Cov-2 infection and should not be used as the sole basis for treatment or other patient management decisions. A negative result may occur with  improper specimen collection/handling, submission of specimen other than nasopharyngeal swab, presence of viral mutation(s) within the areas targeted by this assay, and inadequate number of viral copies(<138 copies/mL). A  negative result must be combined with clinical observations, patient history, and epidemiological information. The expected result is Negative.  Fact Sheet for Patients:  EntrepreneurPulse.com.au  Fact Sheet for Healthcare Providers:  IncredibleEmployment.be  This test is no t yet approved or cleared by the [place] FDA and  has been authorized for detection and/or diagnosis of SARS-CoV-2 by FDA under an Emergency Use Authorization (EUA). This EUA will remain  in effect (meaning this test can be used) for the duration of the COVID-19 declaration under Section 564(b)(1) of the Act, 21 U.S.C.section 360bbb-3(b)(1), unless the authorization is terminated  or revoked sooner.       Influenza A by PCR NEGATIVE NEGATIVE Final   Influenza B by PCR NEGATIVE NEGATIVE Final    Comment: (NOTE) The Xpert Xpress SARS-CoV-2/FLU/RSV plus assay is intended as an aid in the diagnosis of influenza from Nasopharyngeal swab specimens and should not be used as a sole basis for treatment. Nasal washings and aspirates are unacceptable for Xpert Xpress SARS-CoV-2/FLU/RSV testing.  Fact Sheet for Patients: EntrepreneurPulse.com.au  Fact Sheet for Healthcare Providers: IncredibleEmployment.be  This test is not yet approved or cleared by the [place] FDA and has been authorized for detection and/or diagnosis of SARS-CoV-2 by FDA under an Emergency Use Authorization (EUA). This EUA will remain in effect (meaning this test can be used) for the duration of the COVID-19 declaration under Section 564(b)(1) of the Act, 21 U.S.C. section 360bbb-3(b)(1), unless the authorization is terminated or revoked.  Performed at [facility], [address] [place], [place] [address]   Blood culture (routine x 2)     Status: None (Preliminary result)   Collection Time: 08/12/21  7:26 PM   Specimen: BLOOD  Result Value Ref  Range Status   Specimen Description BLOOD RIGHT ANTECUBITAL  Final   Special Requests   Final    BOTTLES DRAWN AEROBIC AND ANAEROBIC Blood Culture adequate volume   Culture   Final    NO GROWTH 4 DAYS Performed at [facility], [address] [place], [place] [address]    Report Status PENDING  Incomplete  Blood culture (routine x 2)     Status: None (Preliminary result)   Collection Time: 08/12/21  8:18 PM   Specimen: BLOOD  Result Value Ref Range  Status   Specimen Description BLOOD RIGHT ANTECUBITAL  Final   Special Requests   Final    BOTTLES DRAWN AEROBIC AND ANAEROBIC Blood Culture adequate volume   Culture   Final    NO GROWTH 4 DAYS Performed at [facility], [address], [place], [place] [address]    Report Status PENDING  Incomplete         Radiology Studies: No results found.      Scheduled Meds:  aspirin EC  81 mg Oral Daily   donepezil  10 mg Oral QHS   enoxaparin (LOVENOX) injection  55 mg Subcutaneous QHS   famotidine  20 mg Oral QHS   fluconazole  150 mg Oral Daily   hydrocortisone cream   Topical BID   insulin aspart  0-15 Units Subcutaneous TID WC   insulin aspart  0-5 Units Subcutaneous QHS   insulin glargine-yfgn  30 Units Subcutaneous QHS   lidocaine  2 patch Transdermal Q24H   melatonin  2.5 mg Oral QHS   nystatin-triamcinolone   Topical BID   QUEtiapine  25 mg Oral QHS   simvastatin  40 mg Oral QHS   Continuous Infusions:  ampicillin-sulbactam (UNASYN) IV 3 g (08/16/21 0312)     LOS: 4 days    Time spent: 25 mins     Wyvonnia Dusky, MD [facility] Pager [phone]  If 7PM-7AM, please contact night-coverage 08/16/2021, 7:23 AM

## 2021-08-17 DIAGNOSIS — I10 Essential (primary) hypertension: Secondary | ICD-10-CM

## 2021-08-17 DIAGNOSIS — R5383 Other fatigue: Secondary | ICD-10-CM

## 2021-08-17 DIAGNOSIS — N3001 Acute cystitis with hematuria: Secondary | ICD-10-CM

## 2021-08-17 DIAGNOSIS — J690 Pneumonitis due to inhalation of food and vomit: Secondary | ICD-10-CM

## 2021-08-17 LAB — BASIC METABOLIC PANEL
Anion gap: 6 (ref 5–15)
BUN: 19 mg/dL (ref 8–23)
CO2: 27 mmol/L (ref 22–32)
Calcium: 8.7 mg/dL — ABNORMAL LOW (ref 8.9–10.3)
Chloride: 106 mmol/L (ref 98–111)
Creatinine, Ser: 0.96 mg/dL (ref 0.44–1.00)
GFR, Estimated: >60 mL/min (ref >60)
Glucose, Bld: 186 mg/dL — ABNORMAL HIGH (ref 70–99)
Potassium: 4.4 mmol/L (ref 3.5–5.1)
Sodium: 139 mmol/L (ref 135–145)

## 2021-08-17 LAB — CULTURE, BLOOD (ROUTINE X 2)
Culture: NO GROWTH
Culture: NO GROWTH
Special Requests: ADEQUATE
Special Requests: ADEQUATE

## 2021-08-17 LAB — GLUCOSE, CAPILLARY
Glucose-Capillary: 186 mg/dL — ABNORMAL HIGH (ref 70–99)
Glucose-Capillary: 168 mg/dL — ABNORMAL HIGH (ref 70–99)
Glucose-Capillary: 198 mg/dL — ABNORMAL HIGH (ref 70–99)
Glucose-Capillary: 220 mg/dL — ABNORMAL HIGH (ref 70–99)

## 2021-08-17 LAB — CBC
HCT: 39.5 % (ref 36.0–46.0)
Hemoglobin: 12.9 g/dL (ref 12.0–15.0)
MCH: 29.3 pg (ref 26.0–34.0)
MCHC: 32.7 g/dL (ref 30.0–36.0)
MCV: 89.6 fL (ref 80.0–100.0)
Platelets: 228 10*3/uL (ref 150–400)
RBC: 4.41 MIL/uL (ref 3.87–5.11)
RDW: 13.4 % (ref 11.5–15.5)
WBC: 6.2 10*3/uL (ref 4.0–10.5)
nRBC: 0 % (ref 0.0–0.2)

## 2021-08-17 MED ORDER — TRAZODONE HCL 50 MG PO TABS
50.0000 mg | ORAL_TABLET | Freq: Once | ORAL | Status: AC
  Administered 2021-08-17: 22:00:00 50 mg via ORAL
  Filled 2021-08-17: qty 1

## 2021-08-17 MED ORDER — METHOCARBAMOL 500 MG PO TABS
500.0000 mg | ORAL_TABLET | Freq: Once | ORAL | Status: AC
  Administered 2021-08-17: 21:00:00 500 mg via ORAL
  Filled 2021-08-17: qty 1

## 2021-08-17 MED ORDER — ACETAMINOPHEN 500 MG PO TABS
1000.0000 mg | ORAL_TABLET | Freq: Once | ORAL | Status: AC
  Administered 2021-08-17: 21:00:00 1000 mg via ORAL
  Filled 2021-08-17: qty 2

## 2021-08-17 MED ORDER — INSULIN GLARGINE-YFGN 100 UNIT/ML ~~LOC~~ SOLN
35.0000 [IU] | Freq: Every day | SUBCUTANEOUS | Status: DC
  Administered 2021-08-17: 22:00:00 35 [IU] via SUBCUTANEOUS
  Filled 2021-08-17 (×2): qty 0.35

## 2021-08-17 NOTE — Progress Notes (Signed)
PROGRESS NOTE  Judith Shaw    DOB: [birth year], 70 y.o.  AJO:[id number]  PCP: Pcp, No   Code Status: Full Code   DOA: 08/12/2021   LOS: 5  Brief Narrative of Current Hospitalization  Judith Shaw is a 70 y.o. female with a PMH significant for obesity, HTN, type II DM, HFrEF, OSA not on CPAP, dementia. They presented from SNF to the ED on 08/12/2021 with weakness/fatigue x several days days. In the ED, it was found that they had hyperkalemia, acute hypoxic respiratory failure. They were treated with kayexalate and oxygen supplementation. She was treated with IV Abx for suspected aspiration pneumonia. She also had yeast in urine which has been treated.  Patient was admitted to medicine service for further workup and management of hypoxia and hyperkalemia as outlined in detail below.  08/17/21 -stable. Medically ready for discharge to SNF when bed available  Assessment & Plan  Principal Problem:   Acute encephalopathy Active Problems:   HTN (hypertension)   Diabetes ([place])   Morbid obesity ([place])  Acute hypoxic respiratory failure- resolved. 2/2 suspected pneumonia. Patient stable ORA. - continue oxygen at night time - unasyn x3 days >augmentin for 5 days, 7 days total treatment for aspiration pneumonia - PT/OT  Yeast infection- s/p fluconazole. asymptomatic  Hyperkalemia   AKI - resolved  Type II DM- A1c 8.5 - continue sSSI - increase semglee to 35units  Low back pain - analgesia PRN    OSA: refuses CPAP  - nightly O2   Chronic pain- - continue home meds   Mild cognitive impairment: CT head shows no acute intracranial findings. Continue on home dose of donepezil, seroquel. Neuro consulted for AMS.    Overactive bladder: hold home dose of myrbetriq   Chronic diastolic CHF: - continue home meds    Morbid obesity: BMI 41.8. Complicates overall care & prognosis  DVT prophylaxis: lovenox   Diet:  Diet Orders (From admission, onward)     Start     Ordered   08/15/21  1231  DIET DYS 3 Room service appropriate? Yes; Fluid consistency: Thin  Diet effective now       Question Answer Comment  Room service appropriate? Yes   Fluid consistency: Thin      08/15/21 1231            Subjective 08/17/21    Pt reports R hip pain. Unable to reposition herself in the bed.  Disposition Plan & Communication  Patient status: Inpatient  Admitted From: Home Disposition: Skilled nursing facility Anticipated discharge date: 1222  Family Communication: none  Consults, Procedures, Significant Events  Consultants:  neurology  Procedures/significant events:  None  Antimicrobials:  Anti-infectives (From admission, onward)    Start     Dose/Rate Route Frequency Ordered Stop   08/14/21 1300  Ampicillin-Sulbactam (UNASYN) 3 g in sodium chloride 0.9 % 100 mL IVPB        3 g 200 mL/hr over 30 Minutes Intravenous Every 6 hours 08/14/21 1136     08/14/21 1230  fluconazole (DIFLUCAN) tablet 150 mg        150 mg Oral Daily 08/14/21 1136 08/16/21 0900   08/13/21 0800  metroNIDAZOLE (FLAGYL) IVPB 500 mg  Status:  Discontinued        500 mg 100 mL/hr over 60 Minutes Intravenous Every 12 hours 08/12/21 1443 08/14/21 1136   08/13/21 0200  ceFEPIme (MAXIPIME) 2 g in sodium chloride 0.9 % 100 mL IVPB  Status:  Discontinued  2 g 200 mL/hr over 30 Minutes Intravenous Every 12 hours 08/12/21 1355 08/14/21 1136   08/12/21 1230  ceFEPIme (MAXIPIME) 2 g in sodium chloride 0.9 % 100 mL IVPB        2 g 200 mL/hr over 30 Minutes Intravenous  Once 08/12/21 1218 08/12/21 1445   08/12/21 1215  metroNIDAZOLE (FLAGYL) IVPB 500 mg        500 mg 100 mL/hr over 60 Minutes Intravenous  Once 08/12/21 1202 08/13/21 1032       Objective   Vitals:   08/16/21 1140 08/16/21 1643 08/16/21 2149 08/17/21 0426  BP: (!) 154/65 125/85 (!) 155/69 (!) 146/71  Pulse: 71 65 75 69  Resp: 16 16 16 17   Temp: 97.6 F (36.4 C) 98.3 F (36.8 C) 98.7 F (37.1 C) 97.6 F (36.4 C)  TempSrc:  Oral Oral Oral Oral  SpO2: 95% 94% 94% 96%  Weight:      Height:        Intake/Output Summary (Last 24 hours) at 08/17/2021 0713 Last data filed at 08/16/2021 2226 Gross per 24 hour  Intake 480 ml  Output 1 ml  Net 479 ml   Filed Weights   08/12/21 1330 08/12/21 1938  Weight: 114 kg 114 kg    Patient BMI: Body mass index is 41.82 kg/m.   Physical Exam:  General: awake, alert, NAD Respiratory: normal respiratory effort. Cardiovascular: quick capillary refill  Nervous: A&O x2. no gross focal neurologic deficits, normal speech Extremities: no edema, normal tone Skin: dry, intact, normal temperature, normal color. No rashes, lesions or ulcers on exposed skin Psychiatry: agitated mood  Labs   I have personally reviewed following labs and imaging studies CBC    Component Value Date/Time   WBC 6.2 08/17/2021 0600   RBC 4.41 08/17/2021 0600   HGB 12.9 08/17/2021 0600   HGB 12.1 05/25/2012 0016   HCT 39.5 08/17/2021 0600   HCT 47.1 (H) 04/17/2019 2322   PLT 228 08/17/2021 0600   PLT 319 05/25/2012 0016   MCV 89.6 08/17/2021 0600   MCV 88 05/25/2012 0016   MCH 29.3 08/17/2021 0600   MCHC 32.7 08/17/2021 0600   RDW 13.4 08/17/2021 0600   RDW 13.1 05/25/2012 0016   LYMPHSABS 2.1 08/12/2021 0906   LYMPHSABS 1.9 05/25/2012 0016   MONOABS 0.7 08/12/2021 0906   MONOABS 0.8 05/25/2012 0016   EOSABS 0.5 08/12/2021 0906   EOSABS 0.2 05/25/2012 0016   BASOSABS 0.1 08/12/2021 0906   BASOSABS 0.1 05/25/2012 0016   BMP Latest Ref Rng & Units 08/17/2021 08/16/2021 08/15/2021  Glucose 70 - 99 mg/dL 186(H) 187(H) 137(H)  BUN 8 - 23 mg/dL 19 17 22   Creatinine 0.44 - 1.00 mg/dL 0.96 0.88 0.69  Sodium 135 - 145 mmol/L 139 137 139  Potassium 3.5 - 5.1 mmol/L 4.4 4.6 4.3  Chloride 98 - 111 mmol/L 106 106 110  CO2 22 - 32 mmol/L 27 25 25   Calcium 8.9 - 10.3 mg/dL 8.7(L) 8.7(L) 8.7(L)   Imaging Studies  No results found.  Medications   Scheduled Meds:  aspirin EC  81 mg Oral  Daily   donepezil  10 mg Oral QHS   enoxaparin (LOVENOX) injection  55 mg Subcutaneous QHS   famotidine  20 mg Oral QHS   hydrocortisone cream   Topical BID   insulin aspart  0-15 Units Subcutaneous TID WC   insulin aspart  0-5 Units Subcutaneous QHS   insulin glargine-yfgn  30 Units Subcutaneous QHS  lidocaine  2 patch Transdermal Q24H   melatonin  2.5 mg Oral QHS   nystatin-triamcinolone   Topical BID   QUEtiapine  25 mg Oral QHS   simvastatin  40 mg Oral QHS   No recently discontinued medications to reconcile  LOS: 5 days   Time spent: >42min  Marlicia Sroka L Izabel Chim, DO [facility] 08/17/2021, 7:13 AM   Available by Epic secure chat 7AM-7PM. If 7PM-7AM, please contact night-coverage Refer to [email or web address] to contact the [facility] Attending or Consulting provider for this pt

## 2021-08-17 NOTE — Plan of Care (Signed)

## 2021-08-17 NOTE — Progress Notes (Signed)
PT Cancellation Note  Patient Details Name: Judith Shaw MRN: [id number] DOB: 1951/06/23   Cancelled Treatment:    Reason Eval/Treat Not Completed: Fatigue/lethargy limiting ability to participate (Coordinated with nursing for patient to be OOB to chair for therapy session this AM. Patient up in chair (via lift) upon arrival to room; but sleeping soundly. Unable to awaken, maintain alertness for participation with session (BP 141/65).  Will re-attempt as time allows.)  Shelia Kingsberry H. Owens Shark, PT, DPT, NCS 08/17/21, 11:07 AM [phone]

## 2021-08-17 NOTE — Progress Notes (Addendum)
Physical Therapy Treatment Patient Details Name: Judith Shaw MRN: [id number] DOB: Mar 02, 1951 Today's Date: 08/17/2021   History of Present Illness Pt is a 70 y/o F admitted on 08/12/21 after presenting with hypoxia, hyperkalemia & fatigue. Pt was recently admitted for UTI & d/c to SNF & labs the day prior showed elevated K+. CT of chest shows likely atelectasis but question aspiration. MRI was limited due to pt behavior but limited exam shows no significant spinal canal stenosis or neural foraminal narrowing. PMH: morbid obesity, HTN, DM, dCHF, OSA not on CPAP, mild dementia, uterine CA, anxiety    PT Comments    Sit/stand x2 with RW, mod assist +2--manual assist to position and stabilize bilat LEs (to prevent sliding from under BOS).  Does require step by step cuing with each repetition to recall, sequence and initiate sit/stand efforts.  Limited/no carry-over of cuing with each repetition.  Patient very fearful of falling and reverts to spontaneous sit with any internal perception of fatigue, LOB, fear.  Significant L LE and truncal extensor tone noted with exertion, making mobility attempts extremely difficult.    Also of note, visible nystagmus noted with return to bed (worsened with rolling?). May consider vestibular assessment/intervention as appropriate for patient.    Recommendations for follow up therapy are one component of a multi-disciplinary discharge planning process, led by the attending physician.  Recommendations may be updated based on patient status, additional functional criteria and insurance authorization.  Follow Up Recommendations  Skilled nursing-short term rehab (<3 hours/day)     Assistance Recommended at Discharge Frequent or constant Supervision/Assistance  Equipment Recommendations  None recommended by PT    Recommendations for Other Services       Precautions / Restrictions Precautions Precautions: Fall Restrictions Weight Bearing Restrictions: No      Mobility  Bed Mobility Overal bed mobility: Needs Assistance Bed Mobility: Sit to Supine Rolling: Mod assist;+2 for physical assistance     Sit to supine: Max assist;Total assist;+2 for physical assistance   General bed mobility comments: poor ability to initiate, sequence and problem-solve functional mobility    Transfers Overall transfer level: Needs assistance Equipment used: Rolling walker (2 wheels) Transfers: Sit to/from Stand;Bed to chair/wheelchair/BSC Sit to Stand: Mod assist;+2 physical assistance Stand pivot transfers: Max assist;Total assist;+2 physical assistance              Ambulation/Gait               General Gait Details: unsafe/unable   Stairs             Wheelchair Mobility    Modified Rankin (Stroke Patients Only)       Balance Overall balance assessment: Needs assistance Sitting-balance support: No upper extremity supported;Feet supported Sitting balance-Leahy Scale: Poor Sitting balance - Comments: posterior lean, absent righting reactions; L LE in full extension (increased tone in response to stress) and not in contact with ground upon transition to sitting edge of bed   Standing balance support: Bilateral upper extremity supported Standing balance-Leahy Scale: Zero Standing balance comment: MAX A +2 for static standing balance, benefits from posterior support on glutes to encourage extension.                            Cognition Arousal/Alertness: Awake/alert Behavior During Therapy: WFL for tasks assessed/performed Overall Cognitive Status: No family/caregiver present to determine baseline cognitive functioning Area of Impairment: Orientation;Memory;Following commands;Safety/judgement;Attention;Problem solving  Orientation Level: Disoriented to;Time;Situation   Memory: Decreased recall of precautions;Decreased short-term memory Following Commands: Follows one step commands  consistently;Follows one step commands with increased time Safety/Judgement: Decreased awareness of safety;Decreased awareness of deficits   Problem Solving: Slow processing;Requires verbal cues;Requires tactile cues General Comments: very limited retention of any new information. Drowsy on and off throughout session requiring multiple modes of stimuli constantly throughout session        Exercises Other Exercises Other Exercises: Sit/stand x2 with RW, mod assist +2--manual assist to position and stabilize bilat LEs (to prevent sliding from under BOS).  Does require step by step cuing with each repetition to recall, sequence and initiate sit/stand efforts.  Limited/no carry-over of cuing with each repetition.  Patient very fearful of falling and reverts to spontaneous sit with any internal perception of fatigue, LOB, fear. Other Exercises: Progressed to attempt single LE step forward/backward, mod/max assist +2.  L LE adducts/extends with exertion; unable to reposition to neutral. Other Exercises: Given success of sit/stand from recliner, planned attempt for SPT from recliner/bed (with arm rest dropped to allow scoot/squat pivot if necessary).  With faciltiated weight shift and attempt at stepping, patient attempts spontaneous sit and L LE/trunk extends forcefully (out of contact with floor), requiring dep assist +2 to pivot and land hips on bed.  Max assist for unsupported sitting balance due to heavy posterior lean with absent attmepts at correction/self-righting. Other Exercises: Rolling bilat, mod assist +2; dep +2 for scooting up in bed; dep assist to position in R sidelying for pressure relief/skin integrity    General Comments        Pertinent Vitals/Pain Pain Assessment: Faces Faces Pain Scale: No hurt Pain Location: L side and back Pain Descriptors / Indicators: Discomfort;Grimacing Pain Intervention(s): Limited activity within patient's tolerance;Monitored during  session;Repositioned (chair to bed transfer)    Home Living                          Prior Function            PT Goals (current goals can now be found in the care plan section) Acute Rehab PT Goals Patient Stated Goal: get strong, walk, return to PLOF PT Goal Formulation: With patient Time For Goal Achievement: 08/27/21 Potential to Achieve Goals: Fair Progress towards PT goals: Progressing toward goals    Frequency    Min 2X/week      PT Plan Current plan remains appropriate    Co-evaluation PT/OT/SLP Co-Evaluation/Treatment: Yes Reason for Co-Treatment: Complexity of the patient's impairments (multi-system involvement);For patient/therapist safety;To address functional/ADL transfers;Necessary to address cognition/behavior during functional activity PT goals addressed during session: Mobility/safety with mobility;Balance OT goals addressed during session: ADL's and self-care;Proper use of Adaptive equipment and DME      AM-PAC PT "6 Clicks" Mobility   Outcome Measure  Help needed turning from your back to your side while in a flat bed without using bedrails?: A Lot Help needed moving from lying on your back to sitting on the side of a flat bed without using bedrails?: Total Help needed moving to and from a bed to a chair (including a wheelchair)?: Total Help needed standing up from a chair using your arms (e.g., wheelchair or bedside chair)?: Total Help needed to walk in hospital room?: Total Help needed climbing 3-5 steps with a railing? : Total 6 Click Score: 7    End of Session Equipment Utilized During Treatment: Gait belt Activity Tolerance: Patient tolerated treatment  well;Patient limited by fatigue Patient left: in bed;with call bell/phone within reach;with bed alarm set;with family/visitor present   PT Visit Diagnosis: Unsteadiness on feet (R26.81);Muscle weakness (generalized) (M62.81);Difficulty in walking, not elsewhere classified (R26.2)      Time: 1410-1434 PT Time Calculation (min) (ACUTE ONLY): 24 min  Charges:  $Therapeutic Activity: 8-22 mins                     Caitlyn Buchanan H. Owens Shark, PT, DPT, NCS 08/17/21, 5:27 PM [phone]

## 2021-08-17 NOTE — TOC Progression Note (Signed)
Transition of Care [facility]) - Progression Note    Patient Details  Name: IZORA BENN MRN: [id number] Date of Birth: 01/20/1951  Transition of Care [facility]) CM/SW [place], RN Phone Number: 08/17/2021, 3:37 PM  Clinical Narrative:   Authorization in progress for patient to return to [facility] tomorrow.  Reference number [id number].     Expected Discharge Plan: [place] Barriers to Discharge: Continued Medical Work up  Expected Discharge Plan and Services Expected Discharge Plan: [place]   Discharge Planning Services: CM Consult Post Acute Care Choice: [place] Living arrangements for the past 2 months: Single Family Home                 DME Arranged: N/A DME Agency: NA       HH Arranged: NA HH Agency: NA         Social Determinants of Health (SDOH) Interventions    Readmission Risk Interventions Readmission Risk Prevention Plan 08/12/2021  Transportation Screening Complete  PCP or Specialist Appt within 3-5 Days Complete  HRI or [place] Complete  Social Work Consult for [place] Planning/Counseling Complete  Palliative Care Screening Not Applicable  Medication Review [employer] photographer) Complete  Some recent data might be hidden

## 2021-08-17 NOTE — Progress Notes (Signed)
Occupational Therapy Treatment Patient Details Name: Judith Shaw MRN: [id number] DOB: 08/04/1951 Today's Date: 08/17/2021   History of present illness Pt is a 70 y/o F admitted on 08/12/21 after presenting with hypoxia, hyperkalemia & fatigue. Pt was recently admitted for UTI & d/c to SNF & labs the day prior showed elevated K+. CT of chest shows likely atelectasis but question aspiration. MRI was limited due to pt behavior but limited exam shows no significant spinal canal stenosis or neural foraminal narrowing. PMH: morbid obesity, HTN, DM, dCHF, OSA not on CPAP, mild dementia, uterine CA, anxiety   OT comments  Pt seen for OT evaluation this date to f/u re: safety with ADLs/ADL mobility. PT/OT co-tx completed to maximize services d/t low functional activity tolerance. OT and PT engage pt in 3 STS with RW with b/l knee/foot blocking. She requires increased cues for all aspects of sequencing and safety. On third STS with RW, drop-arm aspect of recliner utilized and pt requires MAX/TOTAL A +2 to attmept SPS. She ultimately demos significant tone in L LE making pivoting difficult and essnetially does not weight shift for side steps and is unable to control descent back to sitting. TOTAL A +2-3 people to get back to bed. noted to have beats of nystagmus to the right with rolling to reposition in bed. She requires MOD A +2 for rolling and MAX A +2 for LB dressing/peri care bed level with encouragement to bridge hips up. Pt left with all needs met and in reach. Will continue to follow acutely. Continue to anticipate that pt will require STR f/u OT services to improve activity tolerance and safety to reasonable level to return to home environment with aide help.    Recommendations for follow up therapy are one component of a multi-disciplinary discharge planning process, led by the attending physician.  Recommendations may be updated based on patient status, additional functional criteria and insurance  authorization.    Follow Up Recommendations  Skilled nursing-short term rehab (<3 hours/day)    Assistance Recommended at Discharge Frequent or constant Supervision/Assistance  Equipment Recommendations  Other (comment) (defer)    Recommendations for Other Services      Precautions / Restrictions Precautions Precautions: Fall Restrictions Weight Bearing Restrictions: No       Mobility Bed Mobility Overal bed mobility: Needs Assistance Bed Mobility: Rolling;Sit to Supine Rolling: Mod assist;+2 for physical assistance     Sit to supine: Max assist;+2 for safety/equipment   General bed mobility comments: multimodal cues to sequence all aspects of mobilization    Transfers                         Balance Overall balance assessment: Needs assistance Sitting-balance support: Bilateral upper extremity supported;Feet supported Sitting balance-Leahy Scale: Poor Sitting balance - Comments: P unsupported sitting (EOB)   Standing balance support: During functional activity;Bilateral upper extremity supported;Reliant on assistive device for balance Standing balance-Leahy Scale: Zero Standing balance comment: MAX A +2 for static standing balance, benefits from posterior support on glutes to encourage extension.                           ADL either performed or assessed with clinical judgement   ADL Overall ADL's : Needs assistance/impaired                     Lower Body Dressing: Maximal assistance;+2 for physical assistance;Bed level Lower Body Dressing  Details (indicate cue type and reason): rolling to don mesh underwear     Toileting- Clothing Manipulation and Hygiene: Maximal assistance;+2 for physical assistance;Bed level Toileting - Clothing Manipulation Details (indicate cue type and reason): change chucks and peri care            Extremity/Trunk Assessment              Vision       Perception     Praxis      Cognition  Arousal/Alertness: Awake/alert Behavior During Therapy: WFL for tasks assessed/performed Overall Cognitive Status: No family/caregiver present to determine baseline cognitive functioning Area of Impairment: Orientation;Memory;Following commands;Safety/judgement;Attention;Problem solving                 Orientation Level: Disoriented to;Time;Situation   Memory: Decreased recall of precautions;Decreased short-term memory Following Commands: Follows one step commands consistently;Follows one step commands with increased time Safety/Judgement: Decreased awareness of safety;Decreased awareness of deficits   Problem Solving: Slow processing;Requires verbal cues;Requires tactile cues General Comments: very limited retention of any new information. Drowsy on and off throughout session requiring multiple modes of stimuli          Exercises Other Exercises Other Exercises: OT and PT engage pt in 3 STS with RW with b/l knee/foot blocking. She requires increased cues for all aspects of sequencing and safety. On third STS with RW, drop-arm aspect of recliner utilized and pt requires MAX/TOTAL A +2 to attmept SPS. She ultimately demos significant tone in L LE making pivoting difficult and essnetially does not weight shift for side steps and is unable to control descent back to sitting. TOTAL A +2-3 people to get back to bed. noted to have beats of nystagmus to the right with rolling to reposition in bed. She requires MOD A +2 for rolling and MAX A +2 for LB dressing/peri care bed level with encouragement to bridge hips up.   Shoulder Instructions       General Comments      Pertinent Vitals/ Pain       Pain Assessment: Faces Faces Pain Scale: Hurts little more Pain Location: L side and back Pain Descriptors / Indicators: Discomfort;Grimacing Pain Intervention(s): Limited activity within patient's tolerance;Monitored during session;Repositioned (chair to bed transfer)  Home Living                                           Prior Functioning/Environment              Frequency  Min 2X/week        Progress Toward Goals  OT Goals(current goals can now be found in the care plan section)  Progress towards OT goals: Progressing toward goals  Acute Rehab OT Goals Patient Stated Goal: to get stronger OT Goal Formulation: With patient Time For Goal Achievement: 08/27/21 Potential to Achieve Goals: [place] Discharge plan remains appropriate;Frequency remains appropriate    Co-evaluation    PT/OT/SLP Co-Evaluation/Treatment: Yes Reason for Co-Treatment: Complexity of the patient's impairments (multi-system involvement);For patient/therapist safety;Necessary to address cognition/behavior during functional activity;To address functional/ADL transfers PT goals addressed during session: Mobility/safety with mobility;Balance OT goals addressed during session: ADL's and self-care;Proper use of Adaptive equipment and DME      AM-PAC OT "6 Clicks" Daily Activity     Outcome Measure   Help from another person eating meals?: None Help from another person taking care  of personal grooming?: A Little Help from another person toileting, which includes using toliet, bedpan, or urinal?: A Lot Help from another person bathing (including washing, rinsing, drying)?: Total Help from another person to put on and taking off regular upper body clothing?: A Lot Help from another person to put on and taking off regular lower body clothing?: Total 6 Click Score: 13    End of Session Equipment Utilized During Treatment: Gait belt;Rolling walker (2 wheels)  OT Visit Diagnosis: Unsteadiness on feet (R26.81);Muscle weakness (generalized) (M62.81)   Activity Tolerance Patient tolerated treatment well   Patient Left with call bell/phone within reach;with family/visitor present;in bed;with bed alarm set   Nurse Communication Mobility status        Time:  8266-6648 OT Time Calculation (min): 23 min  Charges: OT General Charges $OT Visit: 1 Visit OT Treatments $Self Care/Home Management : 8-22 mins  Gerrianne Scale, [place], OTR/L ascom [phone] 08/17/21, 2:48 PM

## 2021-08-17 NOTE — Progress Notes (Signed)
Inpatient Diabetes Program Recommendations  AACE/ADA: New Consensus Statement on Inpatient Glycemic Control (2015)  Target Ranges:  Prepandial:   less than 140 mg/dL      [facility] postprandial:   less than 180 mg/dL (1-2 hours)      Critically ill patients:  140 - 180 mg/dL   Lab Results  Component Value Date   GLUCAP 186 (H) 08/17/2021   HGBA1C 8.5 (H) 07/29/2021    Review of Glycemic Control  Latest Reference Range & Units 08/16/21 08:49 08/16/21 11:42 08/16/21 16:45 08/16/21 22:19 08/17/21 07:46  Glucose-Capillary 70 - 99 mg/dL 183 (H) 178 (H) 256 (H) 207 (H) 186 (H)   Diabetes history: DM 2 Outpatient Diabetes medications: Novolog 0-15 units tid, Tresiba 32 units qpm, Trulicity 4.5 mg weekly Current orders for Inpatient glycemic control:  Semglee 30 units qhs, Novolog 0-15 units tid + hs  Inpatient Diabetes Program Recommendations:    Glucose trends increase postprandially  -  Increase Semglee to 35 units -  Consider adding Novolog 2 units tid meal coverage if pt eating at least 50% of meals  Thanks,  Tama Headings RN, MSN, BC-ADM Inpatient Diabetes Coordinator Team Pager [phone] (8a-5p)

## 2021-08-17 NOTE — Progress Notes (Signed)
Cross Cover Nurse reports patient highly anxious with elevated BP. Per report,patient has not slept much in past 3 nights even with the seroquel.  Seroquel given early tonight before adding additional med. No improvement assessed bedside.  Dose of trazodone ordered.

## 2021-08-18 DIAGNOSIS — Z8639 Personal history of other endocrine, nutritional and metabolic disease: Secondary | ICD-10-CM

## 2021-08-18 LAB — RESP PANEL BY RT-PCR (FLU A&B, COVID) ARPGX2
Influenza A by PCR: NEGATIVE
Influenza B by PCR: NEGATIVE
SARS Coronavirus 2 by RT PCR: NEGATIVE

## 2021-08-18 LAB — GLUCOSE, CAPILLARY
Glucose-Capillary: 200 mg/dL — ABNORMAL HIGH (ref 70–99)
Glucose-Capillary: 197 mg/dL — ABNORMAL HIGH (ref 70–99)

## 2021-08-18 MED ORDER — AMOXICILLIN-POT CLAVULANATE 875-125 MG PO TABS
1.0000 | ORAL_TABLET | Freq: Two times a day (BID) | ORAL | Status: DC
  Administered 2021-08-18: 10:00:00 1 via ORAL
  Filled 2021-08-18: qty 1

## 2021-08-18 MED ORDER — AMOXICILLIN-POT CLAVULANATE 875-125 MG PO TABS: 1.0000 | ORAL_TABLET | Freq: Two times a day (BID) | ORAL | 0 refills | Status: AC

## 2021-08-18 MED ORDER — TRESIBA FLEXTOUCH 200 UNIT/ML ~~LOC~~ SOPN: 35.0000 [IU] | PEN_INJECTOR | Freq: Every evening | SUBCUTANEOUS | Status: DC

## 2021-08-18 MED ORDER — FAMOTIDINE 20 MG PO TABS: 20.0000 mg | ORAL_TABLET | Freq: Every day | ORAL | 0 refills | Status: DC

## 2021-08-18 NOTE — Discharge Summary (Signed)
Physician Discharge Summary  Judith Shaw DXI:[id number] DOB: 01-23-1951 DOA: 08/12/2021  PCP: Pcp, No  Admit date: 08/12/2021 Discharge date: 08/18/2021  Admitted From: SNF Disposition: Skilled nursing facility  Recommendations for Outpatient Follow-up:  Follow up with PCP within 1-2 weeks for blood pressure check. Pressures were elevated during admission to 767H systolics primarily.  Follow up urinalysis to ensure resolution of yeast.   Discharge Condition:stable CODE STATUS:  Code Status: Full Code  Low calorie, diabetic diet  Brief/Interim Summary: Pt presented from SNF with acute hypoxic respiratory failure thought to be an aspiration pneumonia and exacerbated by body habitus. She required 2L O2 but was able to wean to room air prior to discharge. She completed Antibiotic treatment for the pneumonia. Urine culture positive for yeast and completed diflucan treatment- discharged with no urinary/vaginal symptoms.  Patient's hospitalization was complicated by a severe physical deconditioning from acute illness. Patient states that she was ambulatory prior to illness and is now requiring assistance with movement in bed. PT/OT recommended SNF.  Patient's chronic conditions of HTN and type II DM were poorly controlled throughout stay and medications adjusted throughout but will need outpatient follow up from PCP.   Discharge Diagnoses:  Principal Problem:   Encephalopathy Active Problems:   HTN (hypertension)   Diabetes ([place])   Morbid obesity ([place])   Aspiration pneumonia of lower lobe ([place])   Lethargic  Allergies as of 08/18/2021       Reactions   Codeine         Medication List     STOP taking these medications    furosemide 20 MG tablet Commonly known as: LASIX   mirabegron ER 25 MG Tb24 tablet Commonly known as: MYRBETRIQ   ondansetron 4 MG disintegrating tablet Commonly known as: Zofran ODT   potassium chloride 10 MEQ tablet Commonly known as: KLOR-CON        TAKE these medications    acetaminophen 325 MG tablet Commonly known as: TYLENOL Take 2 tablets (650 mg total) by mouth every 6 (six) hours as needed.   aluminum-magnesium hydroxide-simethicone 419-379-02 MG/5ML Susp Commonly known as: MAALOX Take 30 mLs by mouth 4 (four) times daily -  before meals and at bedtime.   amoxicillin-clavulanate 875-125 MG tablet Commonly known as: AUGMENTIN Take 1 tablet by mouth every 12 (twelve) hours for 4 days.   aspirin EC 81 MG tablet Take 81 mg by mouth daily.   clotrimazole-betamethasone cream Commonly known as: LOTRISONE Apply 1 application topically 2 (two) times daily.   donepezil 10 MG tablet Commonly known as: ARICEPT Take 10 mg by mouth at bedtime.   DULoxetine 60 MG capsule Commonly known as: CYMBALTA Take 60 mg by mouth daily.   famotidine 20 MG tablet Commonly known as: PEPCID Take 1 tablet (20 mg total) by mouth daily. What changed: when to take this   gabapentin 300 MG capsule Commonly known as: NEURONTIN Take 300 mg by mouth at bedtime.   gentian violet 2 % topical solution Apply 0.5 mLs topically in the morning and at bedtime. Apply between the toes twice daily.   Insulin Aspart FlexPen 100 UNIT/ML Commonly known as: NOVOLOG Inject 0-15 Units into the skin 3 (three) times daily with meals.   QUEtiapine 25 MG tablet Commonly known as: SEROQUEL Take 1 tablet (25 mg total) by mouth at bedtime.   simvastatin 40 MG tablet Commonly known as: ZOCOR Take 40 mg by mouth every morning.   Tyler Aas FlexTouch 200 UNIT/ML FlexTouch Pen Generic drug: insulin  degludec Inject 36 Units into the skin every evening. Between 7-9 pm What changed: how much to take   Trulicity 4.5 MW/1.0UV Sopn Generic drug: Dulaglutide Inject 4.5 mg into the skin once a week.        Contact information for after-discharge care     Destination     [facility] Preferred SNF .   Service: Skilled Nursing Contact  information: [place] [phone]                    Allergies  Allergen Reactions   Codeine     Consultations: Neurology   Procedures/Studies: DG Chest 1 View  Result Date: 08/12/2021 CLINICAL DATA:  Lethargic, hypoxia, history of CHF EXAM: CHEST  1 VIEW COMPARISON:  Chest radiograph 07/27/2021 FINDINGS: The heart is enlarged, similar to the prior study. The upper mediastinal contours are stable. There is vascular congestion without definite overt pulmonary interstitial edema. There is no focal consolidation. There is no pleural effusion or pneumothorax. There is no acute osseous abnormality. IMPRESSION: Cardiomegaly with vascular congestion but no definite overt pulmonary edema. Electronically Signed   By: Valetta Mole M.D.   On: 08/12/2021 09:40   CT HEAD WO CONTRAST (5MM)  Result Date: 08/12/2021 CLINICAL DATA:  Mental status change, unknown cause. EXAM: CT HEAD WITHOUT CONTRAST TECHNIQUE: Contiguous axial images were obtained from the base of the skull through the vertex without intravenous contrast. COMPARISON:  07/27/2021 FINDINGS: Brain: No evidence of acute infarction, hemorrhage, hydrocephalus, extra-axial collection or mass lesion/mass effect. Vascular: No hyperdense vessel or unexpected calcification. Skull: Normal. Negative for fracture or focal lesion. Sinuses/Orbits: No acute finding. Other: None. IMPRESSION: No acute intracranial abnormality. Electronically Signed   By: Markus Daft M.D.   On: 08/12/2021 11:45   CT HEAD WO CONTRAST  Result Date: 07/27/2021 CLINICAL DATA:  Neurological deficit EXAM: CT HEAD WITHOUT CONTRAST TECHNIQUE: Contiguous axial images were obtained from the base of the skull through the vertex without intravenous contrast. COMPARISON:  CT head dated April 30, 2021 FINDINGS: Brain: Chronic white matter ischemic change. No evidence of acute infarction, hemorrhage, hydrocephalus, extra-axial collection  or mass lesion/mass effect. Vascular: No hyperdense vessel or unexpected calcification. Skull: Normal. Negative for fracture or focal lesion. Sinuses/Orbits: No acute finding. Other: None. IMPRESSION: No acute intracranial abnormality. Electronically Signed   By: Yetta Glassman M.D.   On: 07/27/2021 13:04   CT Chest Wo Contrast  Result Date: 08/12/2021 CLINICAL DATA:  Respiratory illness, nondiagnostic x-ray. EXAM: CT CHEST WITHOUT CONTRAST TECHNIQUE: Multidetector CT imaging of the chest was performed following the standard protocol without IV contrast. COMPARISON:  07/27/2021 FINDINGS: Cardiovascular: Heart size is normal without significant pericardial fluid. Normal caliber of the thoracic aorta. Mediastinum/Nodes: No evidence for chest lymphadenopathy. Esophagus is unremarkable. No significant axillary lymph node enlargement. Thyroid tissue is unremarkable. Lungs/Pleura: Trachea and mainstem bronchi are patent. No pleural effusions. Increased consolidation in the posterior lower lobes bilaterally. Again noted are hazy densities in the lingula. Upper Abdomen: Cholecystectomy. Again noted is cortical scarring in the left kidney that is only partially imaged. No acute abnormality in the visualized upper abdomen. Musculoskeletal: No acute bone abnormality. IMPRESSION: Increased patchy densities in the posterior lower lobes compared to the previous chest CT. Findings could be related to volume loss but cannot exclude areas of infection. Electronically Signed   By: Markus Daft M.D.   On: 08/12/2021 11:54   CT Angio Chest Pulmonary Embolism (PE) W  or WO Contrast  Result Date: 08/13/2021 CLINICAL DATA:  Elevated D-dimer. Shortness of breath. Extravasation of IV contrast during the study. The patient's nurse was notified by the technologist. EXAM: CT ANGIOGRAPHY CHEST WITH CONTRAST TECHNIQUE: Multidetector CT imaging of the chest was performed using the standard protocol during bolus administration of  intravenous contrast. Multiplanar CT image reconstructions and MIPs were obtained to evaluate the vascular anatomy. CONTRAST:  19mL OMNIPAQUE IOHEXOL 350 MG/ML SOLN COMPARISON:  CT scan August 12, 2021 FINDINGS: Cardiovascular: Due to IV contrast extravasation of the study, there is suboptimal opacification of the aorta and pulmonary arteries. Cardiomegaly identified. No obvious coronary artery disease on this study. The thoracic aorta is nonaneurysmal with no identified atherosclerotic change or dissection. Evaluation for pulmonary emboli is limited due to suboptimal opacification. No obvious pulmonary emboli identified. Mediastinum/Nodes: Thyroid and esophagus are normal. No effusions identified. The chest wall is normal. No adenopathy. Lungs/Pleura: Central airways are normal. No pneumothorax. Bibasilar opacities are centrally identical to yesterday's CT scan. No nodules or masses are identified. Upper Abdomen: No acute abnormality. Musculoskeletal: No chest wall abnormality. No acute or significant osseous findings. Review of the MIP images confirms the above findings. IMPRESSION: 1. Evaluation for pulmonary emboli is markedly limited due to suboptimal contrast enhancement secondary to IV contrast extravasation during the study. The study is nondiagnostic beyond the level of the main pulmonary arteries. Even at the level of the main pulmonary arteries, the study is limited. Within these limitations, no emboli are identified. A repeat study could be performed if clinical concern persists. 2. Bibasilar pulmonary opacities are unchanged. The findings could represent atelectasis but pneumonia or aspiration are not excluded. Recommend attention on follow-up. 3. No other significant abnormalities. Electronically Signed   By: Dorise Bullion III M.D.   On: 08/13/2021 12:18   MR LUMBAR SPINE WO CONTRAST  Result Date: 08/12/2021 CLINICAL DATA:  Low back pain, weakness and fatigue EXAM: MRI LUMBAR SPINE WITHOUT  CONTRAST TECHNIQUE: Multiplanar, multisequence MR imaging of the lumbar spine was performed. No intravenous contrast was administered. COMPARISON:  No prior MRI, correlation is made with CT lumbar spine 04/30/2021. FINDINGS: Evaluation is limited, as patient would not complete axial sequences. Segmentation: Standard. Alignment:  No listhesis. Vertebrae:  No acute fracture or suspicious osseous lesion. Conus medullaris and cauda equina: Conus extends to the L2 level. Conus and cauda equina appear normal. Paraspinal and other soft tissues: Negative Disc levels: Evaluation is limited by sagittal only imaging. Within this limitation, mild multilevel degenerative changes, with disc desiccation and small disc bulges, but no significant spinal canal stenosis or neural foraminal narrowing. IMPRESSION: Evaluation is limited as the patient would not complete axial sequences. Within this limitation, no significant spinal canal stenosis or neural foraminal narrowing. Electronically Signed   By: Merilyn Baba M.D.   On: 08/12/2021 22:51   US ARTERIAL LOWER EXTREMITY DUPLEX BILATERAL  Result Date: 08/01/2021 CLINICAL DATA:  69 year old female with a history of peripheral vascular disease EXAM: NONINVASIVE PHYSIOLOGIC VASCULAR STUDY OF BILATERAL LOWER EXTREMITIES TECHNIQUE: Evaluation of both lower extremities was performed at rest, including calculation of ankle-brachial indices, directed duplex COMPARISON:  None. FINDINGS: Right ABI:  Not acquired Left ABI:  Not acquired Right Lower Extremity: Directed duplex of the right lower extremity demonstrates triphasic waveforms throughout. No elevated velocity identified. Left Lower Extremity: Directed duplex left lower extremity demonstrates triphasic waveforms throughout. No elevated velocities. IMPRESSION: Directed duplex of the bilateral lower extremities demonstrates waveforms maintained bilaterally Signed, Dulcy Fanny. Earleen Newport, DO,  [facility] Radiology Electronically Signed   By: Corrie Mckusick D.O.   On: 08/01/2021 10:15   DG Chest Portable 1 View  Result Date: 07/27/2021 CLINICAL DATA:  Altered mental status EXAM: PORTABLE CHEST 1 VIEW COMPARISON:  Chest x-ray dated April 30, 2021 FINDINGS: Unchanged enlarged cardiac and mediastinal contours. Mild bilateral heterogeneous opacities. No large pleural effusion or pneumothorax. IMPRESSION: Mild bilateral heterogeneous opacities, possibly due to pulmonary edema. SP Electronically Signed   By: Yetta Glassman M.D.   On: 07/27/2021 14:36   CT Angio Chest/Abd/Pel for Dissection W and/or Wo Contrast  Result Date: 07/27/2021 CLINICAL DATA:  Abdominal pain, aortic dissection suspected EXAM: CT ANGIOGRAPHY CHEST, ABDOMEN AND PELVIS TECHNIQUE: Non-contrast CT of the chest was initially obtained. Multidetector CT imaging through the chest, abdomen and pelvis was performed using the standard protocol during bolus administration of intravenous contrast. Multiplanar reconstructed images and MIPs were obtained and reviewed to evaluate the vascular anatomy. CONTRAST:  3mL OMNIPAQUE IOHEXOL 350 MG/ML SOLN COMPARISON:  CT abdomen 07/18/2021 FINDINGS: CTA CHEST FINDINGS Cardiovascular: Thoracic aorta is normal in caliber without evidence of intramural hematoma or dissection. Normal heart size. No pericardial effusion. Mediastinum/Nodes: No enlarged nodes. Imaged thyroid is unremarkable. Lungs/Pleura: Imaged in expiration with patchy atelectasis bilaterally. No pleural effusion or pneumothorax. Musculoskeletal: Degenerative changes of the included spine. Review of the MIP images confirms the above findings. CTA ABDOMEN AND PELVIS FINDINGS VASCULAR Aorta: Normal caliber aorta without aneurysm, dissection, vasculitis or significant stenosis. Mild atherosclerotic plaque. Celiac: Patent.  No significant origin stenosis. SMA: Patent.  No significant origin stenosis. Renals: Patent.  No  significant origin stenosis. IMA: Patent.  No significant origin stenosis. Inflow: Patent.  Normal in caliber. Veins: Not well evaluated. Review of the MIP images confirms the above findings. NON-VASCULAR Hepatobiliary: No focal liver abnormality is seen. Status post cholecystectomy. No biliary dilatation. Pancreas: Unremarkable. Spleen: Unremarkable. Adrenals/Urinary Tract: Adrenals are unremarkable. There is a new small area of hypoenhancement at the lower pole of the right kidney. Unchanged left renal scarring with atrophy. Similar circumferential bladder wall thickening. Stomach/Bowel: Stomach is within normal limits. Bowel is normal in caliber. Normal appendix. Lymphatic: No enlarged nodes. Reproductive: Status post hysterectomy. No adnexal masses. Other: No free fluid. No acute abnormality of the abdominal wall. Left lateral abdominal wall is partially excluded. Musculoskeletal: Degenerative changes of the included spine. Degenerative changes of the hips. Review of the MIP images confirms the above findings. IMPRESSION: No evidence of aortic dissection. Small area of hypoenhancement at the lower pole of the right kidney suspicious for pyelonephritis. Circumferential bladder wall thickening may reflect cystitis or under distension. Mild aortic atherosclerosis. Electronically Signed   By: Macy Mis M.D.   On: 07/27/2021 15:21    Subjective: Patient feels improved today. Denies respiratory distress. Complains of chronic pain in back/hips. She is ready to go to SNF.   Discharge Exam: Vitals:   08/17/21 2118 08/18/21 0615  BP: (!) 173/58 (!) 169/80  Pulse: 74 82  Resp:  20  Temp:  98.1 F (36.7 C)  SpO2: 91% 95%    General: Pt is alert, awake, not in acute distress Cardiovascular: RRR, S1/S2 +, no rubs, no gallops Respiratory: CTA bilaterally, no wheezing, no rhonchi Abdominal: Soft, NT, ND, bowel sounds + Extremities: symmetrical trace edema, no cyanosis  Labs: Basic Metabolic  Panel: Recent Labs  Lab 08/13/21 0607 08/14/21 0417 08/15/21 0358 08/16/21 0510 08/17/21 0600  NA 138 136 139 137 139  K 5.0 4.8 4.3 4.6 4.4  CL 109 110 110 106 106  CO2 24 23 25 25 27   GLUCOSE 203* 149* 137* 187* 186*  BUN 53* 37* 22 17 19   CREATININE 1.16* 0.84 0.69 0.88 0.96  CALCIUM 8.9 9.1 8.7* 8.7* 8.7*   CBC: Recent Labs  Lab 08/12/21 0906 08/13/21 0607 08/14/21 0417 08/15/21 0358 08/16/21 0510 08/17/21 0600  WBC 8.1 7.5 6.3 6.6 7.4 6.2  NEUTROABS 4.8  --   --   --   --   --   HGB 14.6 13.9 13.2 12.7 12.8 12.9  HCT 45.7 43.2 41.3 39.3 39.1 39.5  MCV 89.6 90.2 90.4 88.5 89.3 89.6  PLT 284 257 248 237 239 228    Microbiology Recent Results (from the past 240 hour(s))  Urine Culture     Status: Abnormal   Collection Time: 08/12/21 10:57 AM   Specimen: Urine, Random  Result Value Ref Range Status   Specimen Description   Final    URINE, RANDOM Performed at [facility], [address], [place], [place] [address]    Special Requests   Final    NONE Performed at [facility], [place], [place] [address]    Culture 80,000 COLONIES/mL YEAST (A)  Final   Report Status 08/14/2021 FINAL  Final  Resp Panel by RT-PCR (Flu A&B, Covid) Nasopharyngeal Swab     Status: None   Collection Time: 08/12/21 12:43 PM   Specimen: Nasopharyngeal Swab; Nasopharyngeal(NP) swabs in vial transport medium  Result Value Ref Range Status   SARS Coronavirus 2 by RT PCR NEGATIVE NEGATIVE Final    Comment: (NOTE) SARS-CoV-2 target nucleic acids are NOT DETECTED.  The SARS-CoV-2 RNA is generally detectable in upper respiratory specimens during the acute phase of infection. The lowest concentration of SARS-CoV-2 viral copies this assay can detect is 138 copies/mL. A negative result does not preclude SARS-Cov-2 infection and should not be used as the sole basis for treatment or other patient management decisions. A negative result may occur with   improper specimen collection/handling, submission of specimen other than nasopharyngeal swab, presence of viral mutation(s) within the areas targeted by this assay, and inadequate number of viral copies(<138 copies/mL). A negative result must be combined with clinical observations, patient history, and epidemiological information. The expected result is Negative.  Fact Sheet for Patients:  EntrepreneurPulse.com.au  Fact Sheet for Healthcare Providers:  IncredibleEmployment.be  This test is no t yet approved or cleared by the [place] FDA and  has been authorized for detection and/or diagnosis of SARS-CoV-2 by FDA under an Emergency Use Authorization (EUA). This EUA will remain  in effect (meaning this test can be used) for the duration of the COVID-19 declaration under Section 564(b)(1) of the Act, 21 U.S.C.section 360bbb-3(b)(1), unless the authorization is terminated  or revoked sooner.       Influenza A by PCR NEGATIVE NEGATIVE Final   Influenza B by PCR NEGATIVE NEGATIVE Final    Comment: (NOTE) The Xpert Xpress SARS-CoV-2/FLU/RSV plus assay is intended as an aid in the diagnosis of influenza from Nasopharyngeal swab specimens and should not be used as a sole basis for treatment. Nasal washings and aspirates are unacceptable for Xpert Xpress SARS-CoV-2/FLU/RSV testing.  Fact Sheet for Patients: EntrepreneurPulse.com.au  Fact Sheet for Healthcare Providers: IncredibleEmployment.be  This test is not yet approved or cleared by the [place] FDA and has been authorized for detection and/or diagnosis of SARS-CoV-2 by FDA under an Emergency  Use Authorization (EUA). This EUA will remain in effect (meaning this test can be used) for the duration of the COVID-19 declaration under Section 564(b)(1) of the Act, 21 U.S.C. section 360bbb-3(b)(1), unless the authorization is terminated  or revoked.  Performed at [facility], [address] [place], [place] [address]   Blood culture (routine x 2)     Status: None   Collection Time: 08/12/21  7:26 PM   Specimen: BLOOD  Result Value Ref Range Status   Specimen Description BLOOD RIGHT ANTECUBITAL  Final   Special Requests   Final    BOTTLES DRAWN AEROBIC AND ANAEROBIC Blood Culture adequate volume   Culture   Final    NO GROWTH 5 DAYS Performed at [facility], [address] [place], [place] [address]    Report Status 08/17/2021 FINAL  Final  Blood culture (routine x 2)     Status: None   Collection Time: 08/12/21  8:18 PM   Specimen: BLOOD  Result Value Ref Range Status   Specimen Description BLOOD RIGHT ANTECUBITAL  Final   Special Requests   Final    BOTTLES DRAWN AEROBIC AND ANAEROBIC Blood Culture adequate volume   Culture   Final    NO GROWTH 5 DAYS Performed at [facility], [address], [place], [place] [address]    Report Status 08/17/2021 FINAL  Final    Time coordinating discharge: Over 30 minutes  Richarda Osmond, MD  [facility] 08/18/2021, 9:48 AM

## 2021-08-18 NOTE — Care Management Important Message (Signed)
Important Message  Patient Details  Name: Judith Shaw MRN: [id number] Date of Birth: [birth year]   Medicare Important Message Given:  Yes     [name] 08/18/2021, 11:21 AM

## 2021-08-18 NOTE — TOC Progression Note (Signed)
Transition of Care [facility]) - Progression Note    Patient Details  Name: Judith Shaw MRN: [id number] Date of Birth: [birth year]  Transition of Care [facility]) CM/SW [place], RN Phone Number: 08/18/2021, 8:59 AM  Clinical Narrative: Josem Kaufmann [phone] Review 08/23/2021  Patient can transfer to [facility] today as per Tammy via EMS Called patient's sister to inform her of transfer to [facility] today via EMS    Expected Discharge Plan: [place] Barriers to Discharge: Continued Medical Work up  Expected Discharge Plan and Services Expected Discharge Plan: [place]   Discharge Planning Services: CM Consult Post Acute Care Choice: [place] Living arrangements for the past 2 months: Single Family Home                 DME Arranged: N/A DME Agency: NA       HH Arranged: NA HH Agency: NA         Social Determinants of Health (SDOH) Interventions    Readmission Risk Interventions Readmission Risk Prevention Plan 08/12/2021  Transportation Screening Complete  PCP or Specialist Appt within 3-5 Days Complete  HRI or Home Care Consult Complete  Social Work Consult for [place] Planning/Counseling Complete  Palliative Care Screening Not Applicable  Medication Review Press photographer) Complete  Some recent data might be hidden

## 2021-09-23 ENCOUNTER — Emergency Department: Payer: Medicare PPO

## 2021-09-23 ENCOUNTER — Inpatient Hospital Stay
Admission: EM | Admit: 2021-09-23 | Discharge: 2021-10-21 | DRG: 175 | Disposition: A | Discharge status: Nursing Home (Includes ICF) | Payer: Medicare PPO | Source: Skilled Nursing Facility | Attending: Internal Medicine | Admitting: Internal Medicine

## 2021-09-23 ENCOUNTER — Other Ambulatory Visit: Payer: Self-pay

## 2021-09-23 ENCOUNTER — Encounter: Payer: Self-pay | Admitting: Pharmacy Technician

## 2021-09-23 DIAGNOSIS — N390 Urinary tract infection, site not specified: Secondary | ICD-10-CM | POA: Diagnosis present

## 2021-09-23 DIAGNOSIS — F0393 Unspecified dementia, unspecified severity, with mood disturbance: Secondary | ICD-10-CM | POA: Diagnosis present

## 2021-09-23 DIAGNOSIS — Z79899 Other long term (current) drug therapy: Secondary | ICD-10-CM

## 2021-09-23 DIAGNOSIS — F0394 Unspecified dementia, unspecified severity, with anxiety: Secondary | ICD-10-CM | POA: Diagnosis present

## 2021-09-23 DIAGNOSIS — L8990 Pressure ulcer of unspecified site, unspecified stage: Secondary | ICD-10-CM | POA: Insufficient documentation

## 2021-09-23 DIAGNOSIS — E1165 Type 2 diabetes mellitus with hyperglycemia: Secondary | ICD-10-CM | POA: Diagnosis not present

## 2021-09-23 DIAGNOSIS — Z91199 Patient's noncompliance with other medical treatment and regimen due to unspecified reason: Secondary | ICD-10-CM

## 2021-09-23 DIAGNOSIS — E785 Hyperlipidemia, unspecified: Secondary | ICD-10-CM | POA: Diagnosis present

## 2021-09-23 DIAGNOSIS — R55 Syncope and collapse: Secondary | ICD-10-CM | POA: Diagnosis not present

## 2021-09-23 DIAGNOSIS — Z794 Long term (current) use of insulin: Secondary | ICD-10-CM

## 2021-09-23 DIAGNOSIS — Z90710 Acquired absence of both cervix and uterus: Secondary | ICD-10-CM

## 2021-09-23 DIAGNOSIS — E669 Obesity, unspecified: Secondary | ICD-10-CM

## 2021-09-23 DIAGNOSIS — R296 Repeated falls: Secondary | ICD-10-CM | POA: Diagnosis present

## 2021-09-23 DIAGNOSIS — B961 Klebsiella pneumoniae [K. pneumoniae] as the cause of diseases classified elsewhere: Secondary | ICD-10-CM | POA: Diagnosis present

## 2021-09-23 DIAGNOSIS — G9340 Encephalopathy, unspecified: Secondary | ICD-10-CM | POA: Diagnosis not present

## 2021-09-23 DIAGNOSIS — Z833 Family history of diabetes mellitus: Secondary | ICD-10-CM

## 2021-09-23 DIAGNOSIS — J9801 Acute bronchospasm: Secondary | ICD-10-CM | POA: Diagnosis not present

## 2021-09-23 DIAGNOSIS — I5032 Chronic diastolic (congestive) heart failure: Secondary | ICD-10-CM | POA: Diagnosis present

## 2021-09-23 DIAGNOSIS — L853 Xerosis cutis: Secondary | ICD-10-CM

## 2021-09-23 DIAGNOSIS — Z8542 Personal history of malignant neoplasm of other parts of uterus: Secondary | ICD-10-CM

## 2021-09-23 DIAGNOSIS — G9341 Metabolic encephalopathy: Secondary | ICD-10-CM | POA: Diagnosis present

## 2021-09-23 DIAGNOSIS — U071 COVID-19: Secondary | ICD-10-CM

## 2021-09-23 DIAGNOSIS — Z8249 Family history of ischemic heart disease and other diseases of the circulatory system: Secondary | ICD-10-CM

## 2021-09-23 DIAGNOSIS — R509 Fever, unspecified: Secondary | ICD-10-CM

## 2021-09-23 DIAGNOSIS — Z818 Family history of other mental and behavioral disorders: Secondary | ICD-10-CM

## 2021-09-23 DIAGNOSIS — W19XXXA Unspecified fall, initial encounter: Secondary | ICD-10-CM | POA: Diagnosis present

## 2021-09-23 DIAGNOSIS — Z8042 Family history of malignant neoplasm of prostate: Secondary | ICD-10-CM

## 2021-09-23 DIAGNOSIS — Z9049 Acquired absence of other specified parts of digestive tract: Secondary | ICD-10-CM

## 2021-09-23 DIAGNOSIS — Z6838 Body mass index (BMI) 38.0-38.9, adult: Secondary | ICD-10-CM

## 2021-09-23 DIAGNOSIS — I10 Essential (primary) hypertension: Secondary | ICD-10-CM | POA: Diagnosis present

## 2021-09-23 DIAGNOSIS — I2699 Other pulmonary embolism without acute cor pulmonale: Secondary | ICD-10-CM | POA: Diagnosis not present

## 2021-09-23 DIAGNOSIS — G4730 Sleep apnea, unspecified: Secondary | ICD-10-CM | POA: Diagnosis present

## 2021-09-23 DIAGNOSIS — F0390 Unspecified dementia without behavioral disturbance: Secondary | ICD-10-CM | POA: Diagnosis present

## 2021-09-23 DIAGNOSIS — I959 Hypotension, unspecified: Secondary | ICD-10-CM | POA: Diagnosis present

## 2021-09-23 DIAGNOSIS — L89152 Pressure ulcer of sacral region, stage 2: Secondary | ICD-10-CM | POA: Diagnosis present

## 2021-09-23 DIAGNOSIS — I110 Hypertensive heart disease with heart failure: Secondary | ICD-10-CM | POA: Diagnosis present

## 2021-09-23 DIAGNOSIS — E1169 Type 2 diabetes mellitus with other specified complication: Secondary | ICD-10-CM | POA: Diagnosis present

## 2021-09-23 DIAGNOSIS — E119 Type 2 diabetes mellitus without complications: Secondary | ICD-10-CM

## 2021-09-23 DIAGNOSIS — E440 Moderate protein-calorie malnutrition: Secondary | ICD-10-CM | POA: Insufficient documentation

## 2021-09-23 DIAGNOSIS — Z7401 Bed confinement status: Secondary | ICD-10-CM

## 2021-09-23 DIAGNOSIS — R4182 Altered mental status, unspecified: Secondary | ICD-10-CM

## 2021-09-23 DIAGNOSIS — R5383 Other fatigue: Secondary | ICD-10-CM | POA: Diagnosis present

## 2021-09-23 DIAGNOSIS — Y92009 Unspecified place in unspecified non-institutional (private) residence as the place of occurrence of the external cause: Secondary | ICD-10-CM

## 2021-09-23 DIAGNOSIS — B9689 Other specified bacterial agents as the cause of diseases classified elsewhere: Secondary | ICD-10-CM

## 2021-09-23 LAB — BLOOD GAS, ARTERIAL
Acid-Base Excess: 0.2 mmol/L (ref 0.0–2.0)
Bicarbonate: 26 mmol/L (ref 20.0–28.0)
FIO2: 0.21
O2 Saturation: 93.8 %
Patient temperature: 37
pCO2 arterial: 45 mmHg (ref 32.0–48.0)
pH, Arterial: 7.37 (ref 7.350–7.450)
pO2, Arterial: 72 mmHg — ABNORMAL LOW (ref 83.0–108.0)

## 2021-09-23 LAB — T4, FREE: Free T4: 1.09 ng/dL (ref 0.61–1.12)

## 2021-09-23 LAB — CBC WITH DIFFERENTIAL/PLATELET
Abs Immature Granulocytes: 0.02 10*3/uL (ref 0.00–0.07)
Basophils Absolute: 0.1 10*3/uL (ref 0.0–0.1)
Basophils Relative: 1 %
Eosinophils Absolute: 0.3 10*3/uL (ref 0.0–0.5)
Eosinophils Relative: 4 %
HCT: 51.2 % — ABNORMAL HIGH (ref 36.0–46.0)
Hemoglobin: 16.3 g/dL — ABNORMAL HIGH (ref 12.0–15.0)
Immature Granulocytes: 0 %
Lymphocytes Relative: 24 %
Lymphs Abs: 1.9 10*3/uL (ref 0.7–4.0)
MCH: 28.8 pg (ref 26.0–34.0)
MCHC: 31.8 g/dL (ref 30.0–36.0)
MCV: 90.5 fL (ref 80.0–100.0)
Monocytes Absolute: 0.6 10*3/uL (ref 0.1–1.0)
Monocytes Relative: 7 %
Neutro Abs: 5.3 10*3/uL (ref 1.7–7.7)
Neutrophils Relative %: 64 %
Platelets: 238 10*3/uL (ref 150–400)
RBC: 5.66 MIL/uL — ABNORMAL HIGH (ref 3.87–5.11)
RDW: 13.6 % (ref 11.5–15.5)
WBC: 8.1 10*3/uL (ref 4.0–10.5)
nRBC: 0 % (ref 0.0–0.2)

## 2021-09-23 LAB — CBC
HCT: 51.1 % — ABNORMAL HIGH (ref 36.0–46.0)
Hemoglobin: 16.3 g/dL — ABNORMAL HIGH (ref 12.0–15.0)
MCH: 29 pg (ref 26.0–34.0)
MCHC: 31.9 g/dL (ref 30.0–36.0)
MCV: 90.8 fL (ref 80.0–100.0)
Platelets: 227 10*3/uL (ref 150–400)
RBC: 5.63 MIL/uL — ABNORMAL HIGH (ref 3.87–5.11)
RDW: 13.4 % (ref 11.5–15.5)
WBC: 7.6 10*3/uL (ref 4.0–10.5)
nRBC: 0 % (ref 0.0–0.2)

## 2021-09-23 LAB — CREATININE, SERUM
Creatinine, Ser: 1.08 mg/dL — ABNORMAL HIGH (ref 0.44–1.00)
GFR, Estimated: 55 mL/min — ABNORMAL LOW (ref >60)

## 2021-09-23 LAB — RESP PANEL BY RT-PCR (FLU A&B, COVID) ARPGX2
Influenza A by PCR: NEGATIVE
Influenza B by PCR: NEGATIVE
SARS Coronavirus 2 by RT PCR: NEGATIVE

## 2021-09-23 LAB — COMPREHENSIVE METABOLIC PANEL
ALT: 24 U/L (ref 0–44)
AST: 27 U/L (ref 15–41)
Albumin: 3.1 g/dL — ABNORMAL LOW (ref 3.5–5.0)
Alkaline Phosphatase: 113 U/L (ref 38–126)
Anion gap: 9 (ref 5–15)
BUN: 31 mg/dL — ABNORMAL HIGH (ref 8–23)
CO2: 27 mmol/L (ref 22–32)
Calcium: 9.6 mg/dL (ref 8.9–10.3)
Chloride: 101 mmol/L (ref 98–111)
Creatinine, Ser: 0.98 mg/dL (ref 0.44–1.00)
GFR, Estimated: >60 mL/min (ref >60)
Glucose, Bld: 258 mg/dL — ABNORMAL HIGH (ref 70–99)
Potassium: 4.9 mmol/L (ref 3.5–5.1)
Sodium: 137 mmol/L (ref 135–145)
Total Bilirubin: 0.5 mg/dL (ref 0.3–1.2)
Total Protein: 7.4 g/dL (ref 6.5–8.1)

## 2021-09-23 LAB — AMMONIA: Ammonia: 11 umol/L (ref 9–35)

## 2021-09-23 LAB — TROPONIN I (HIGH SENSITIVITY)
Troponin I (High Sensitivity): 9 ng/L (ref <18)
Troponin I (High Sensitivity): 8 ng/L (ref <18)

## 2021-09-23 LAB — D-DIMER, QUANTITATIVE: D-Dimer, Quant: 6.85 ug/mL-FEU — ABNORMAL HIGH (ref 0.00–0.50)

## 2021-09-23 LAB — LACTIC ACID, PLASMA: Lactic Acid, Venous: 1.5 mmol/L (ref 0.5–1.9)

## 2021-09-23 LAB — CK: Total CK: 242 U/L — ABNORMAL HIGH (ref 38–234)

## 2021-09-23 LAB — GLUCOSE, CAPILLARY
Glucose-Capillary: 185 mg/dL — ABNORMAL HIGH (ref 70–99)
Glucose-Capillary: 181 mg/dL — ABNORMAL HIGH (ref 70–99)

## 2021-09-23 LAB — TSH: TSH: 2.992 u[IU]/mL (ref 0.350–4.500)

## 2021-09-23 LAB — CBG MONITORING, ED: Glucose-Capillary: 234 mg/dL — ABNORMAL HIGH (ref 70–99)

## 2021-09-23 LAB — LIPASE, BLOOD: Lipase: 33 U/L (ref 11–51)

## 2021-09-23 IMAGING — DX DG CHEST 1V PORT
1 series · 1 of 1 positions shown · non-contrast
Comparison: Chest x-ray dated [DATE]

CLINICAL DATA: Weakness

EXAM:
PORTABLE CHEST 1 VIEW

[chest ap]
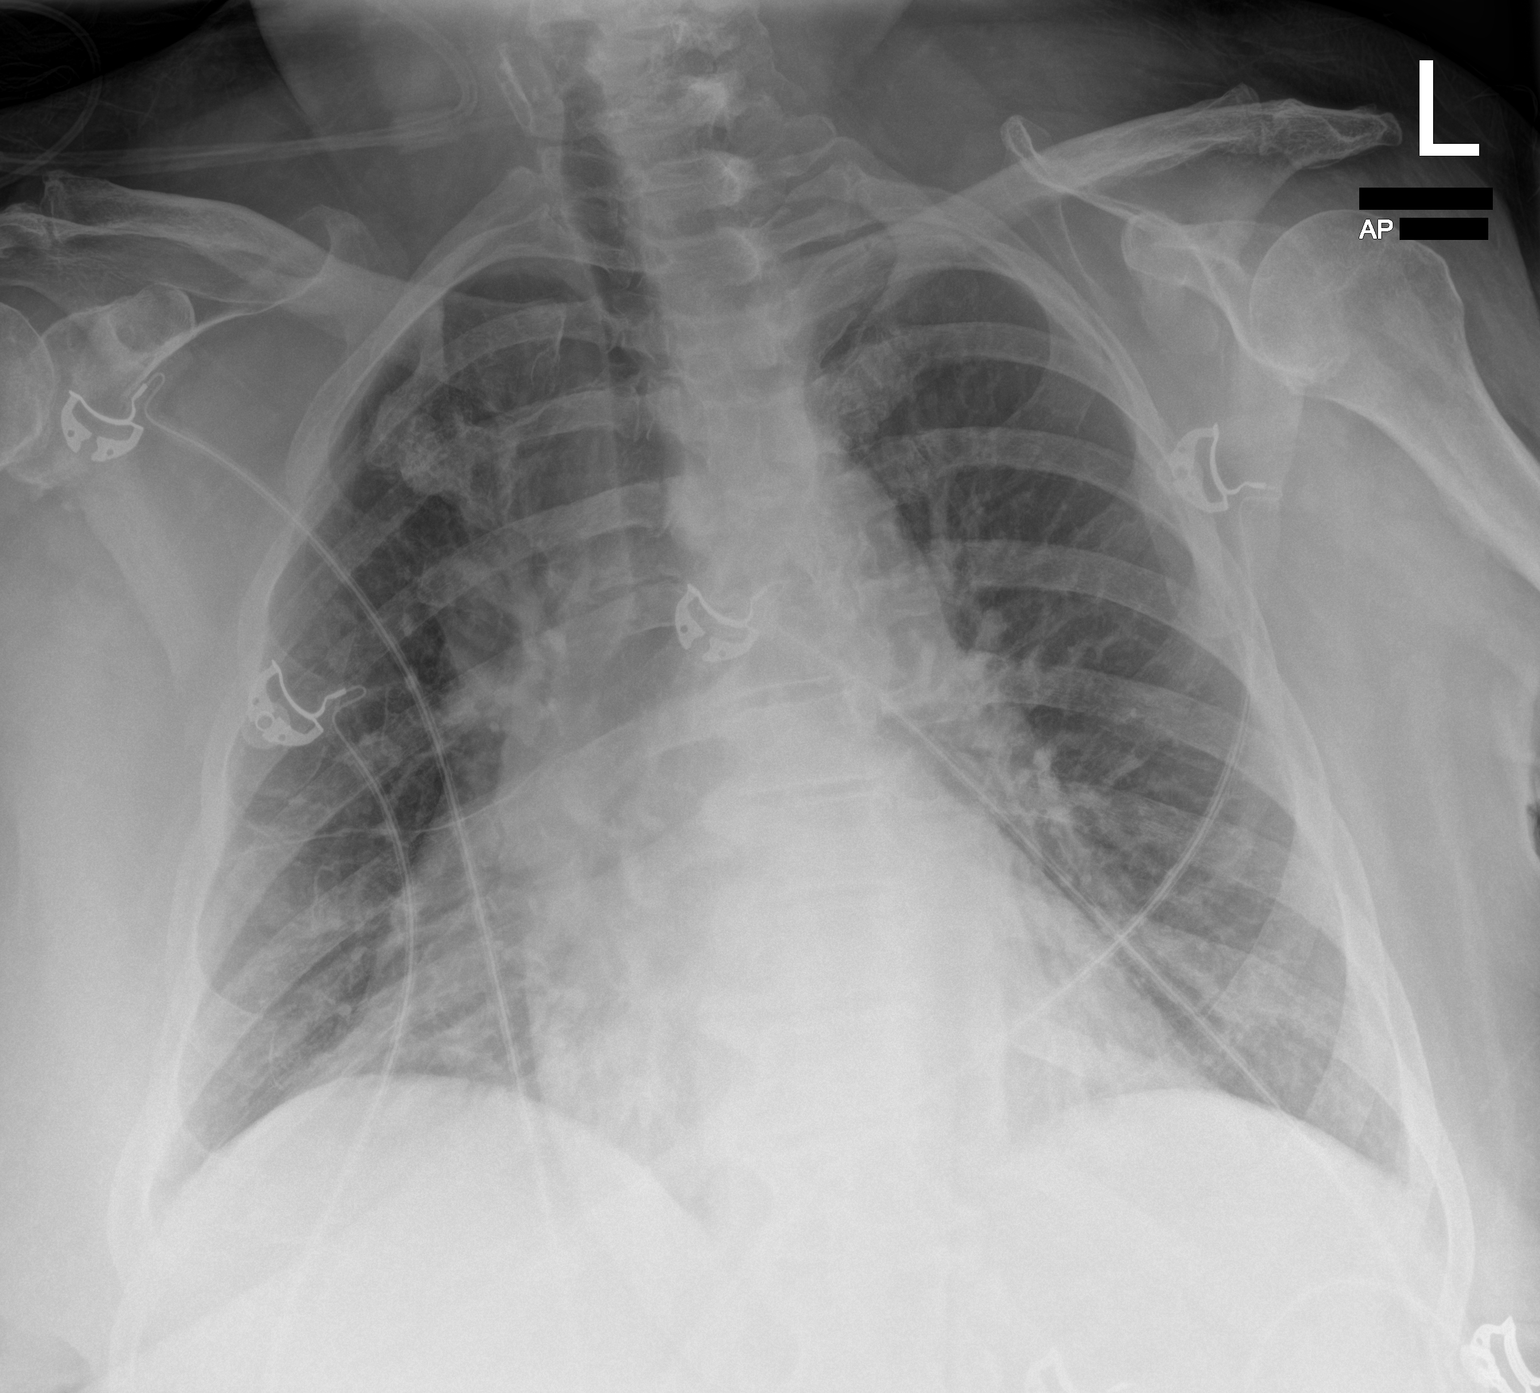

[1 of 1 positions shown; findings below may reference images not displayed]

FINDINGS: Cardiac and mediastinal contours are unchanged. Mild scattered
linear opacities which are similar to prior exam and likely due to
scarring. No evidence of acute parenchymal opacity. No large pleural
effusion or pneumothorax.
IMPRESSION: No active disease.

## 2021-09-23 IMAGING — CT CT HEAD W/O CM
4 series · 16 of 47 positions shown, 18 images · non-contrast
Comparison: [DATE]

CLINICAL DATA: Mental status change, unknown cause



[Series 2: head wo · axial · 0.40mm/px · z∈[+266,+381]mm · 7 of 31 slices shown, 9 images]
[im 4/31  brain]
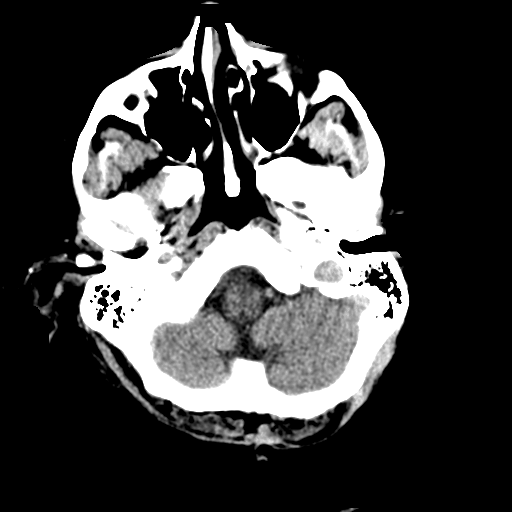
[im 4/31  bone]
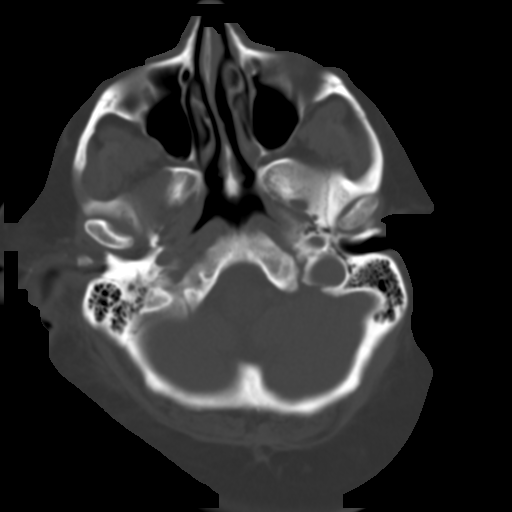
[im 8/31  brain]
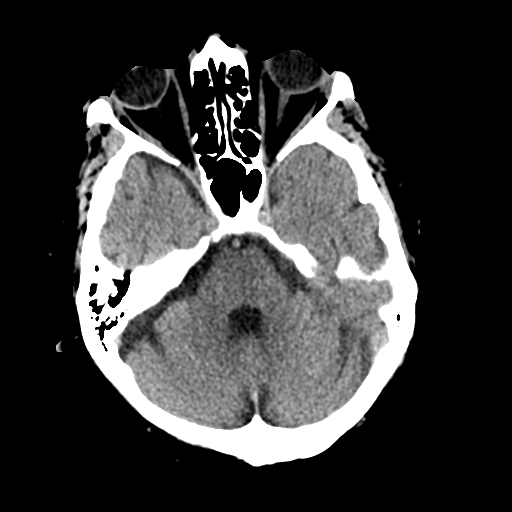
[im 12/31  brain]
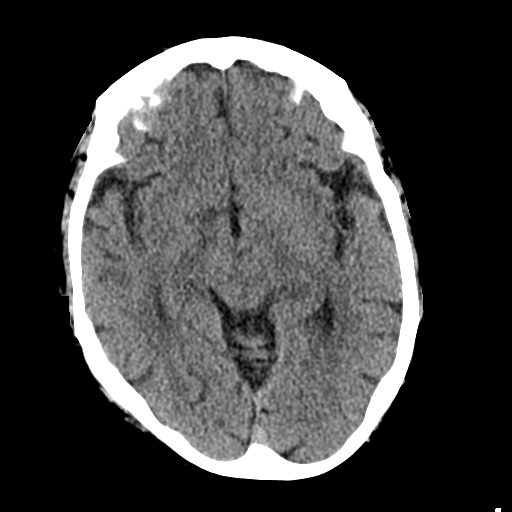
[im 16/31  brain]
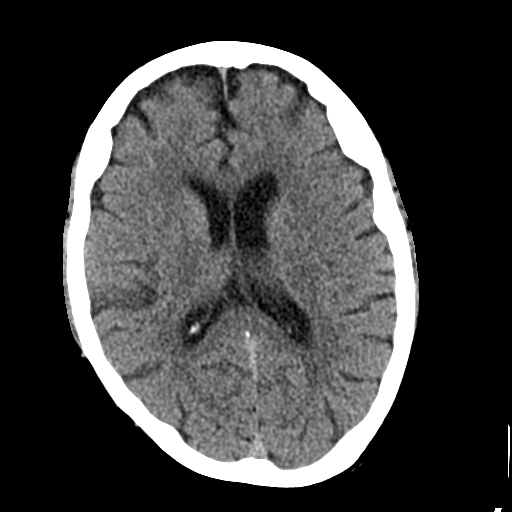
[im 19/31  brain]
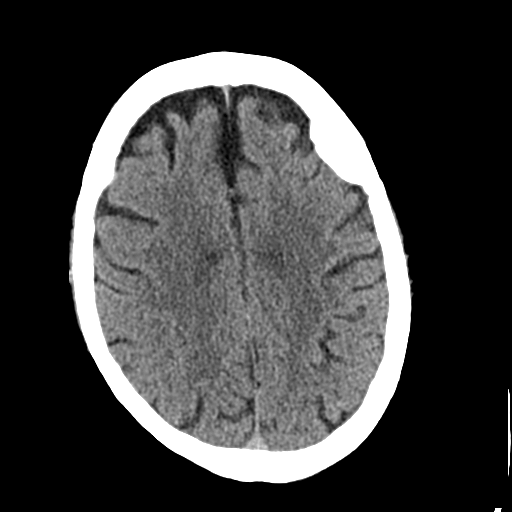
[im 19/31  bone]
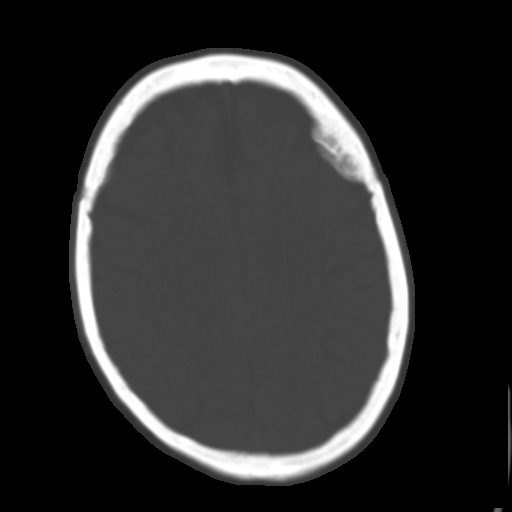
[im 23/31  brain]
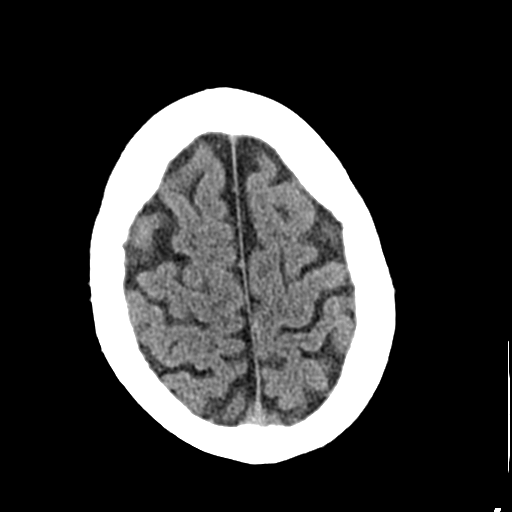
[im 27/31  brain]
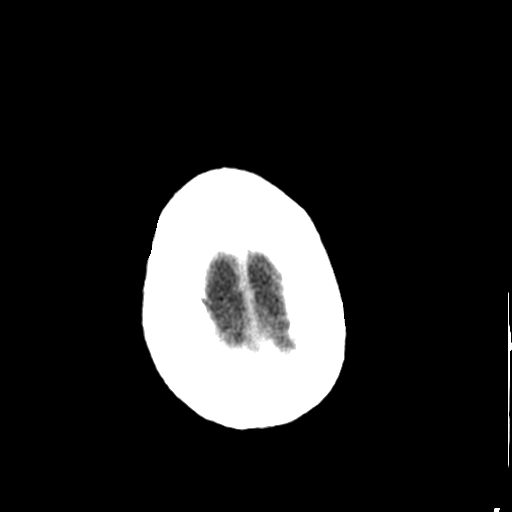

[Series 3: head bone · axial · 0.40mm/px · z∈[+265,+295]mm · 3 of 76 slices shown]
[im 8/76  bone]
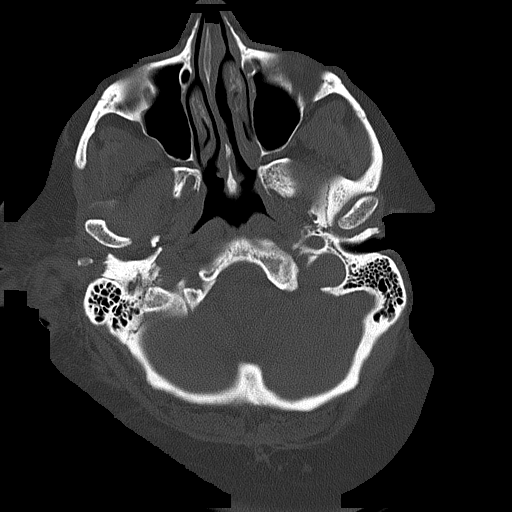
[im 16/76  bone]
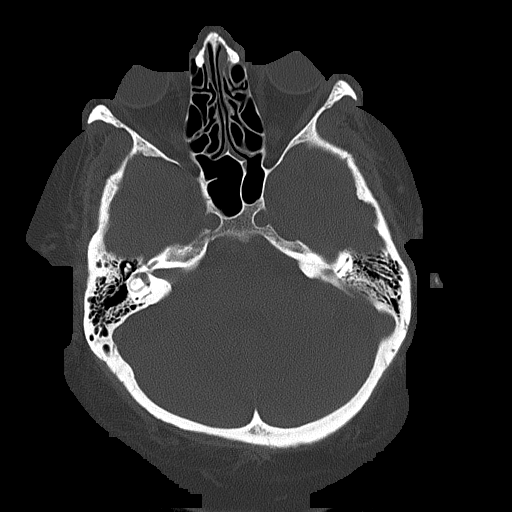
[im 23/76  bone]
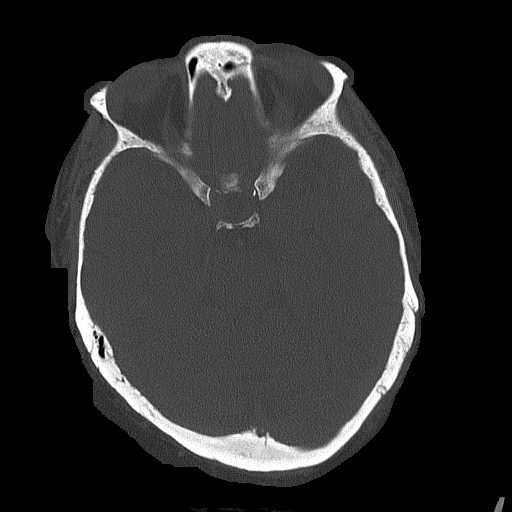

[Series 4: coronal soft tissue · coronal · 0.32mm/px · 3 of 71 slices shown]
[im 26/71  brain]
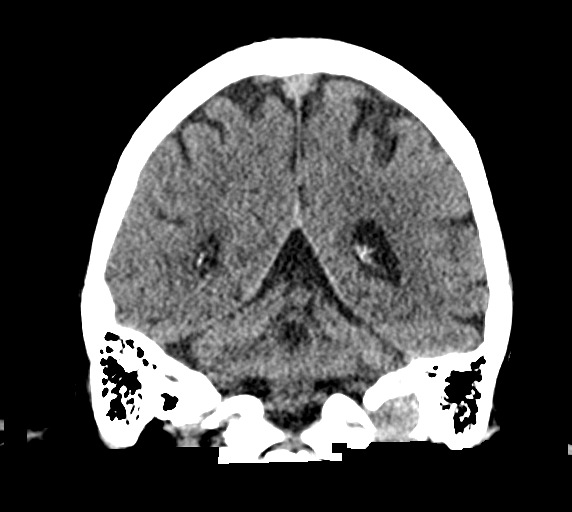
[im 32/71  brain]
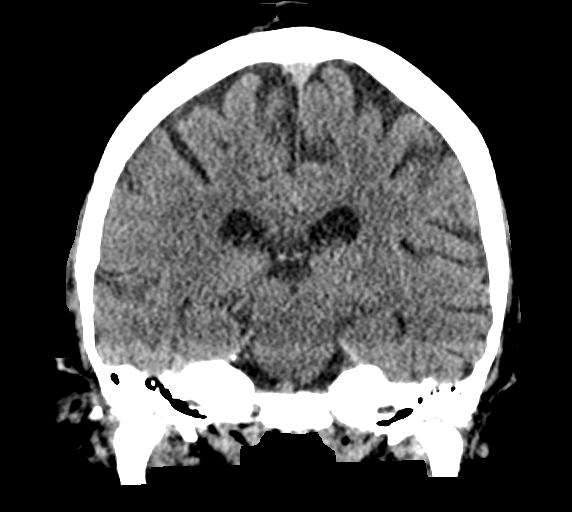
[im 39/71  brain]
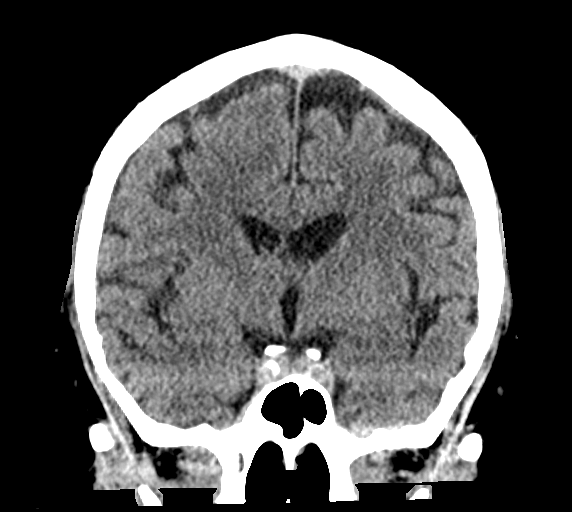

[Series 5: sagittal soft tissue · sagittal · 0.33mm/px · 3 of 61 slices shown]
[im 21/61  brain]
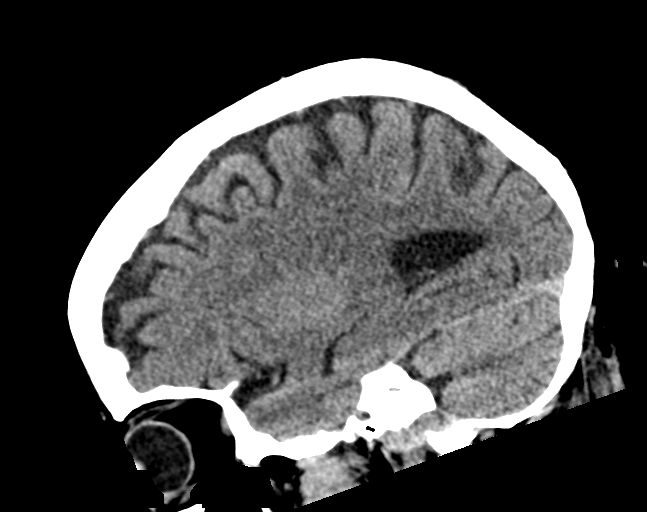
[im 31/61  brain]
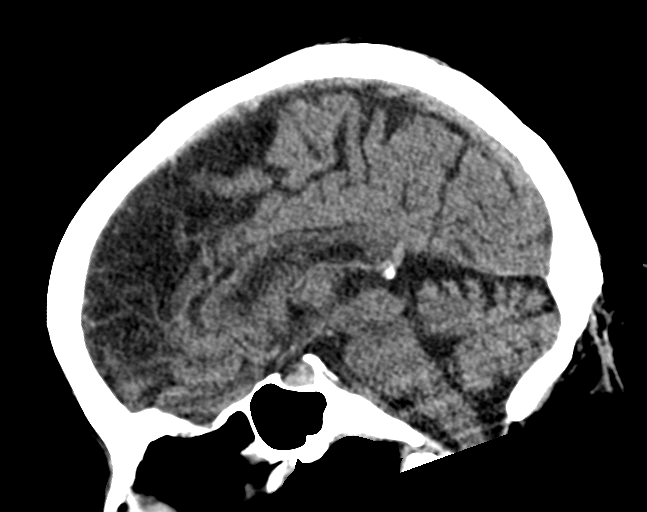
[im 41/61  brain]
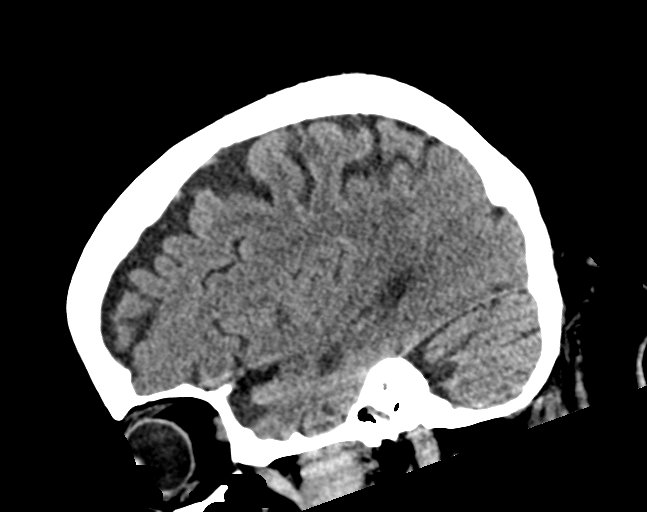

[16 of 47 positions shown; findings below may reference images not displayed]

FINDINGS: Brain: There is no acute intracranial hemorrhage, mass effect, or
edema. Gray-white differentiation is preserved. There is no
extra-axial fluid collection. Ventricles and sulci are stable in
size and configuration. Minimal patchy low-density in the
supratentorial white matter is nonspecific but probably reflects
stable minor chronic microvascular ischemic changes.

Vascular: There is atherosclerotic calcification at the skull base.

Skull: Calvarium is unremarkable.

Sinuses/Orbits: No acute finding.

Other: None.
IMPRESSION: No acute intracranial abnormality.

## 2021-09-23 IMAGING — MR MR HEAD W/O CM
13 series · 48 of 48 positions shown · non-contrast
Comparison: None.

CLINICAL DATA: Mental status change, unknown cause

EXAM:
MRI HEAD WITHOUT CONTRAST
TECHNIQUE: Multiplanar, multiecho pulse sequences of the brain and surrounding
structures were obtained without intravenous contrast.

[Series 5: ax dwi_tracew · axial · 3.0mm · 0.65mm/px · z∈[-152,+3]mm · 2 of 48 slices shown]
[im 1/48]
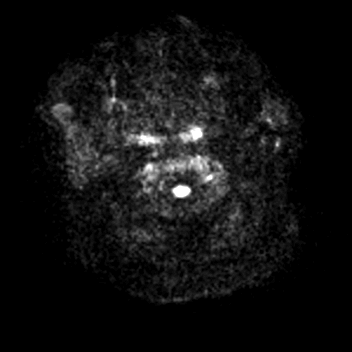
[im 48/48]
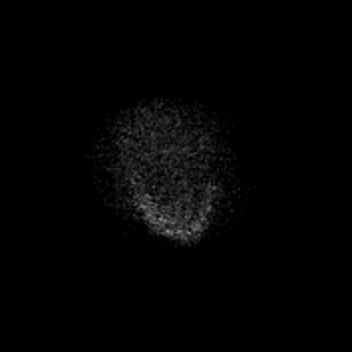

[Series 6: ax dwi_adc · axial · 3.0mm · 0.65mm/px · z∈[-152,+3]mm · 3 of 48 slices shown]
[im 1/48]
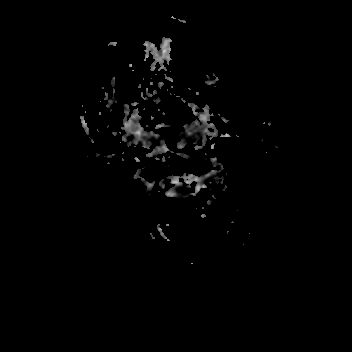
[im 24/48]
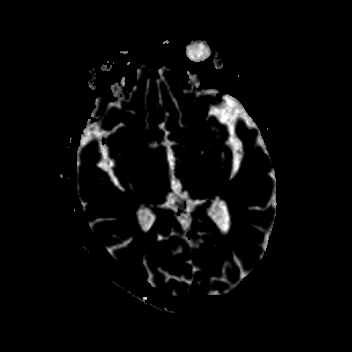
[im 48/48]
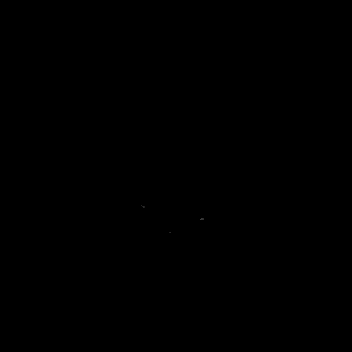

[Series 7: cor dwi_tracew · coronal · 5.0mm · 0.65mm/px · 3 of 40 slices shown]
[im 1/40]
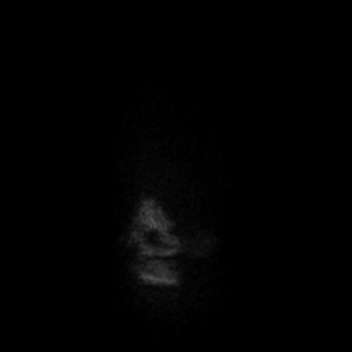
[im 20/40]
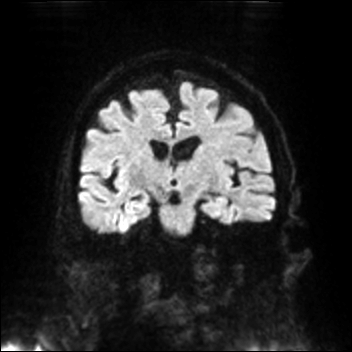
[im 40/40]
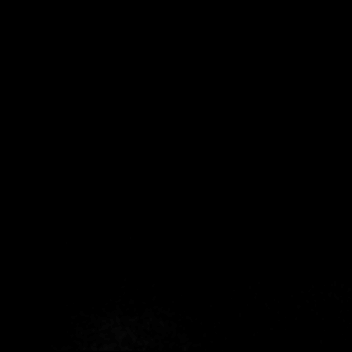

[Series 8: cor dwi_adc · coronal · 5.0mm · 0.65mm/px · 2 of 37 slices shown]
[im 1/37]
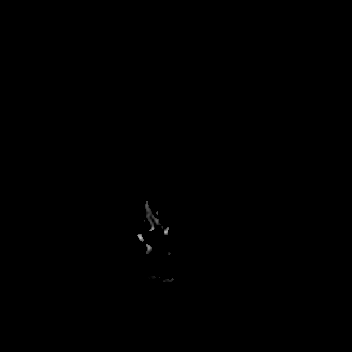
[im 37/37]
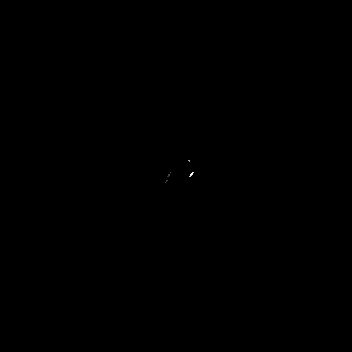

[Series 9: T1 · sagittal · 5.0mm · 0.94mm/px · 2 of 23 slices shown (1 of 2)]
[im 1/23]
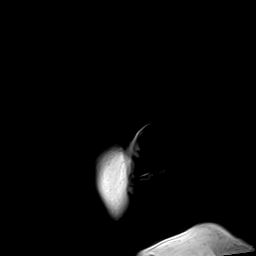
[im 23/23]
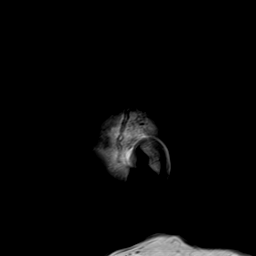

[Series 10: T2 · axial · 5.0mm · 0.45mm/px · z∈[-150,+5]mm · 2 of 27 slices shown (1 of 2)]
[im 1/27]
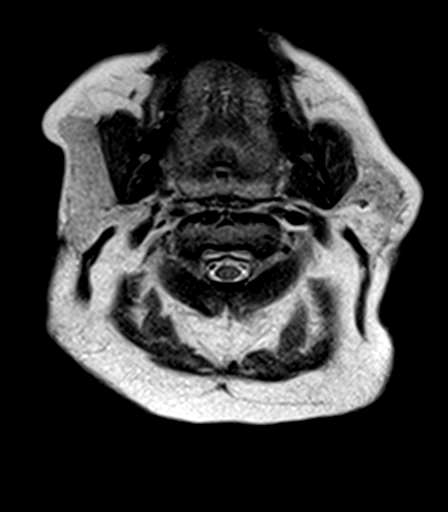
[im 27/27]
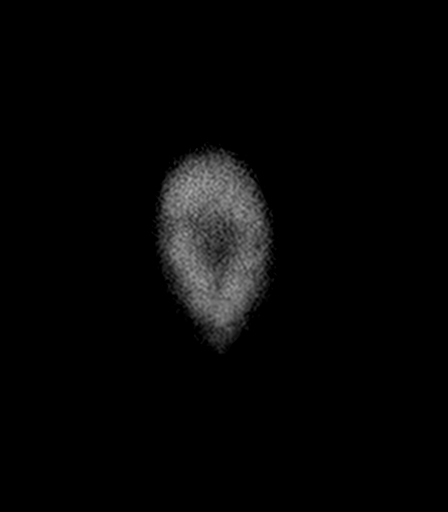

[Series 11: mag_images · axial · 3.0mm · 0.90mm/px · z∈[-161,+15]mm · 4 of 60 slices shown]
[im 1/60]
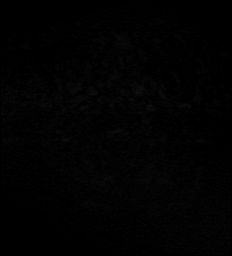
[im 20/60]
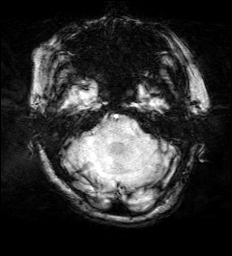
[im 40/60]
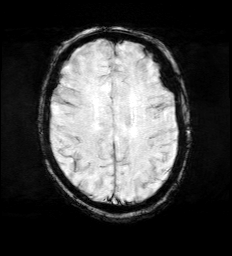
[im 60/60]
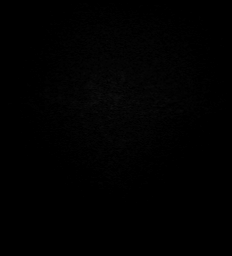

[Series 12: pha_images · axial · 3.0mm · 0.90mm/px · z∈[-155,+15]mm · 4 of 57 slices shown]
[im 1/57]
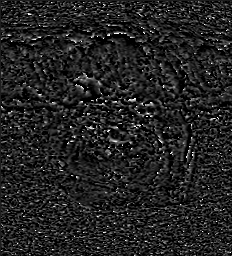
[im 19/57]
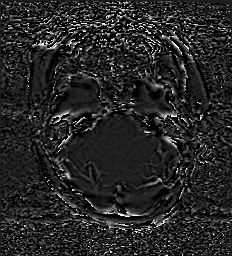
[im 38/57]
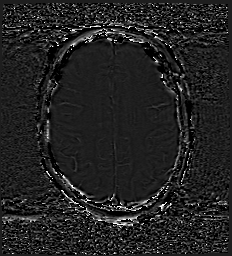
[im 57/57]
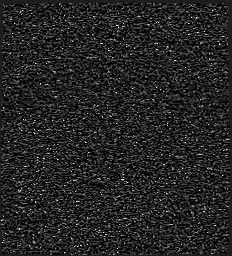

[Series 13: swi_images · axial · 3.0mm · 0.90mm/px · z∈[-161,+15]mm · 4 of 60 slices shown]
[im 1/60]
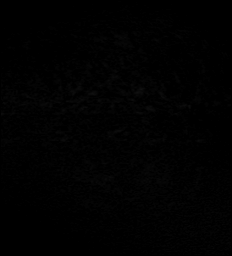
[im 20/60]
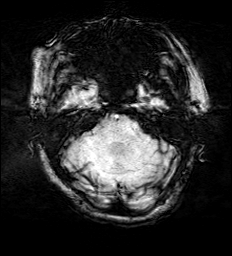
[im 40/60]
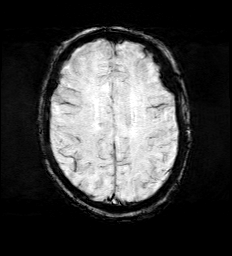
[im 60/60]
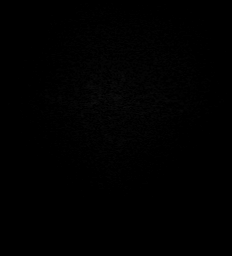

[Series 14: mip_images(sw) · axial · 24.0mm · 0.90mm/px · z∈[-150,+5]mm · 4 of 53 slices shown]
[im 1/53]
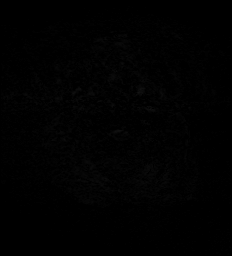
[im 18/53]
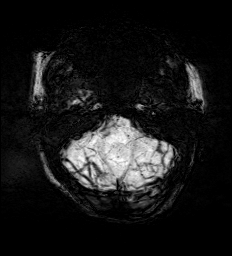
[im 35/53]
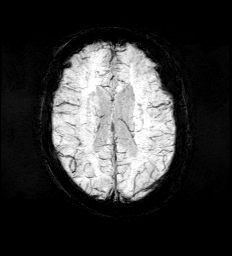
[im 53/53]
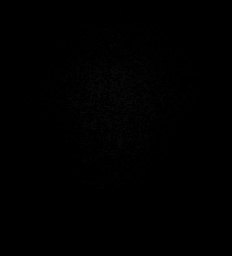

[Series 15: FLAIR · axial · 3.0mm · 0.53mm/px · z∈[-153,+8]mm · 4 of 55 slices shown]
[im 1/55]
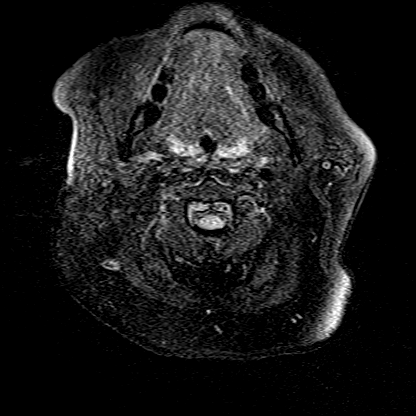
[im 19/55]
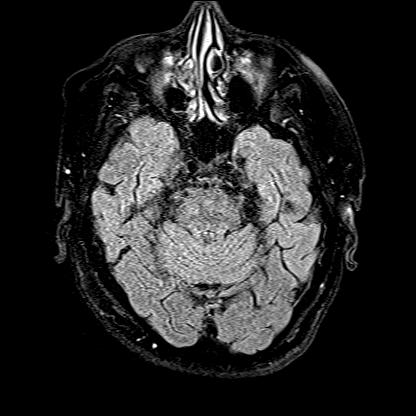
[im 37/55]
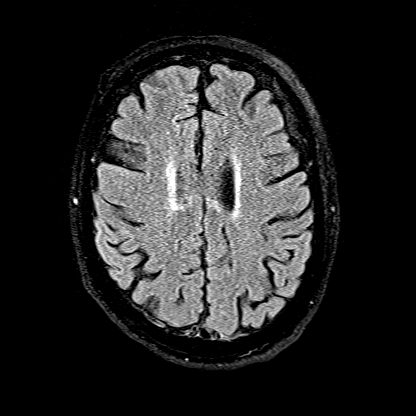
[im 55/55]
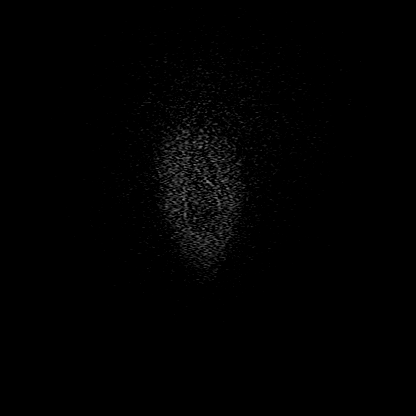

[Series 16: T1 · axial · 1.0mm · 0.98mm/px · z∈[-161,+13]mm · 12 of 176 slices shown (2 of 2)]
[im 1/176]
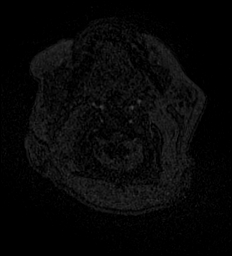
[im 16/176]
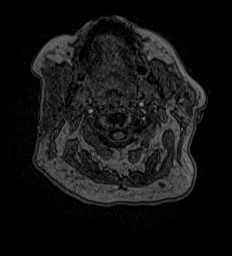
[im 32/176]
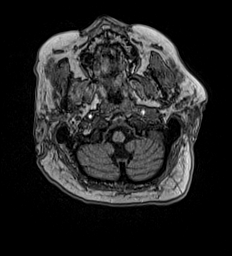
[im 48/176]
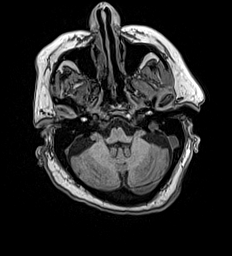
[im 64/176]
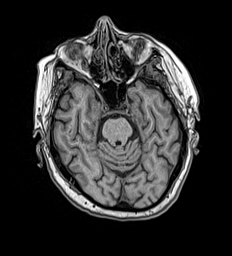
[im 80/176]
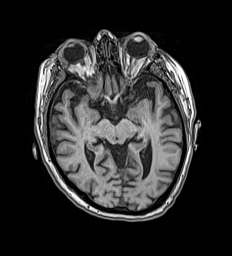
[im 96/176]
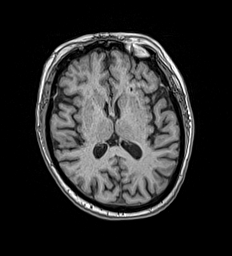
[im 112/176]
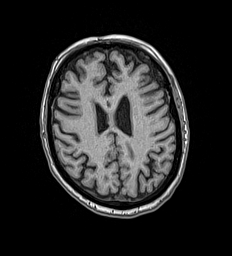
[im 128/176]
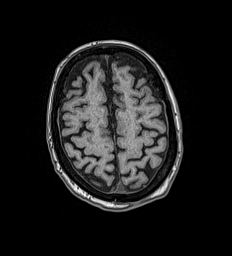
[im 144/176]
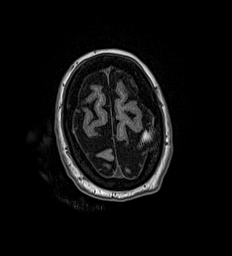
[im 160/176]
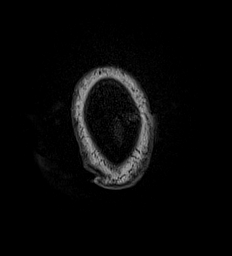
[im 176/176]
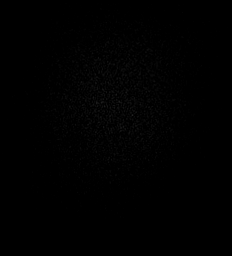

[Series 17: T2 · coronal · 5.0mm · 0.45mm/px · 2 of 31 slices shown (2 of 2)]
[im 1/31]
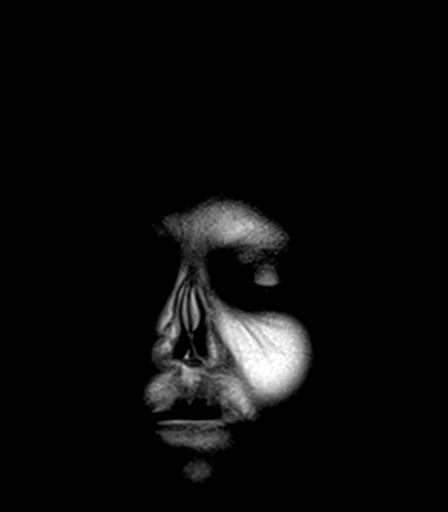
[im 31/31]
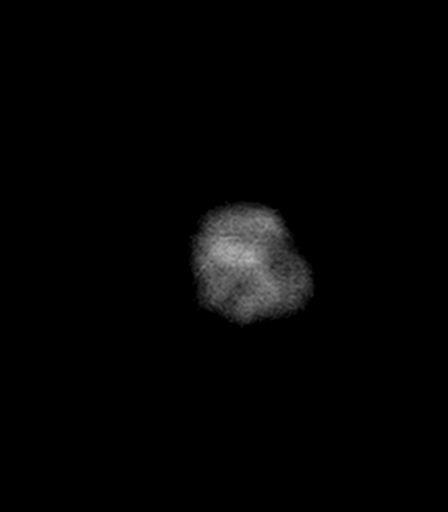

[48 of 48 positions shown; findings below may reference images not displayed]

FINDINGS: Brain: There is no acute infarction or intracranial hemorrhage.
There is no intracranial mass, mass effect, or edema. There is no
hydrocephalus or extra-axial fluid collection. Ventricles and sulci
are within normal limits in size and configuration. Patchy foci of
T2 hyperintensity in the supratentorial white matter are nonspecific
but may reflect minimal chronic microvascular ischemic changes.

Vascular: Major vessel flow voids at the skull base are preserved.

Skull and upper cervical spine: Normal marrow signal is preserved.

Sinuses/Orbits: Paranasal sinuses are aerated. Orbits are
unremarkable.

Other: Sella is unremarkable.  Mastoid air cells are clear.
IMPRESSION: No evidence of recent infarction, hemorrhage, or mass.

## 2021-09-23 MED ORDER — INSULIN GLARGINE-YFGN 100 UNIT/ML ~~LOC~~ SOLN
15.0000 [IU] | Freq: Every day | SUBCUTANEOUS | Status: DC
  Administered 2021-09-23 – 2021-10-13 (×20): 15 [IU] via SUBCUTANEOUS
  Filled 2021-09-23 (×22): qty 0.15

## 2021-09-23 MED ORDER — LACTATED RINGERS IV SOLN: INTRAVENOUS | Status: AC

## 2021-09-23 MED ORDER — HYDRALAZINE HCL 20 MG/ML IJ SOLN: 5.0000 mg | INTRAMUSCULAR | Status: DC | PRN

## 2021-09-23 MED ORDER — ACETAMINOPHEN 650 MG RE SUPP: 650.0000 mg | Freq: Four times a day (QID) | RECTAL | Status: DC | PRN

## 2021-09-23 MED ORDER — ACETAMINOPHEN 325 MG PO TABS
650.0000 mg | ORAL_TABLET | Freq: Four times a day (QID) | ORAL | Status: DC | PRN
  Administered 2021-09-25 – 2021-10-16 (×13): 650 mg via ORAL
  Filled 2021-09-23 (×14): qty 2

## 2021-09-23 MED ORDER — INSULIN ASPART 100 UNIT/ML IJ SOLN
0.0000 [IU] | INTRAMUSCULAR | Status: DC
  Administered 2021-09-23 – 2021-09-24 (×4): 2 [IU] via SUBCUTANEOUS
  Administered 2021-09-24: 19:00:00 1 [IU] via SUBCUTANEOUS
  Administered 2021-09-24: 2 [IU] via SUBCUTANEOUS
  Administered 2021-09-24 – 2021-09-25 (×4): 1 [IU] via SUBCUTANEOUS
  Filled 2021-09-23 (×9): qty 1

## 2021-09-23 MED ORDER — SODIUM CHLORIDE 0.9 % IV BOLUS
500.0000 mL | Freq: Once | INTRAVENOUS | Status: AC
  Administered 2021-09-23: 500 mL via INTRAVENOUS

## 2021-09-23 MED ORDER — ENOXAPARIN SODIUM 40 MG/0.4ML IJ SOSY
40.0000 mg | PREFILLED_SYRINGE | Freq: Every day | INTRAMUSCULAR | Status: DC
  Administered 2021-09-23: 22:00:00 40 mg via SUBCUTANEOUS
  Filled 2021-09-23: qty 0.4

## 2021-09-23 NOTE — ED Notes (Signed)
Pt continues to remain somnolent. Family at the bedside.

## 2021-09-23 NOTE — ED Triage Notes (Signed)
Pt bib ems from [facility] after physical therapy went in to work with patient and noticed pt to have some difficulty breathing. Pt then went unconscious and BP dropped into the 88'T systolic with oxygen saturations in the 70's. Staff placed pt on 2L [place] with improvement to the 90's. EMS noted pt to have pinpoint pupils, gave 1mg  intranasal narcan with improvement in oxygenation and mental status. Pt arrives awake, will follow commands.  149/63 CBG 200's 95% RA

## 2021-09-23 NOTE — ED Provider Notes (Signed)
[facility] Provider Note    Event Date/Time   First MD Initiated Contact with Patient 09/23/21 1133     (approximate)   History   Altered Mental Status   HPI  Judith Shaw is a 71 y.o. female with a past history of IDDM, obesity, hypertension, UTI who is brought to the ED from physical therapy due to syncope.  Patient had appeared to have shortness of breath when she arrived.  After passing out, blood pressure was noted to be 25K systolic, oxygen saturation dropped into the 70s.  This resolved with 2 L nasal cannula oxygen.  EMS arrived and noted pinpoint pupils and gave 1 mg of intranasal Narcan with improvement in mentation.  Reviewed patient's medication list that accompanies her, it does not include any opioids.  Patient denies taking any opioids, but also admits poor recollection of events of today.  She denies any pain or fever.  She feels very tired but no other focal complaints.     Physical Exam   Triage Vital Signs: ED Triage Vitals  Enc Vitals Group     BP 09/23/21 1130 (!) 144/71     Pulse Rate 09/23/21 1130 76     Resp 09/23/21 1130 15     Temp --      Temp src --      SpO2 09/23/21 1130 95 %     Weight --      Height --      Head Circumference --      Peak Flow --      Pain Score 09/23/21 1131 0     Pain Loc --      Pain Edu? --      Excl. in [place]? --     Most recent vital signs: Vitals:   09/23/21 1330 09/23/21 1400  BP: 109/68 131/71  Pulse: (!) 57 (!) 58  Resp: 14 12  Temp:    SpO2: 92% 94%     General: Awake, no distress.  Ill-appearing.  Dry mucous membranes CV:  Good peripheral perfusion.  Symmetric pulses Resp:  Normal effort.  Clear to auscultation bilaterally.  Unlabored breathing Abd:  No distention.  Soft and nontender Other:  No peripheral edema, no inflammatory skin changes.   ED Results / Procedures / Treatments   Labs (all labs ordered are listed, but only abnormal results are displayed) Labs  Reviewed  COMPREHENSIVE METABOLIC PANEL - Abnormal; Notable for the following components:      Result Value   Glucose, Bld 258 (*)    BUN 31 (*)    Albumin 3.1 (*)    All other components within normal limits  CBC WITH DIFFERENTIAL/PLATELET - Abnormal; Notable for the following components:   RBC 5.66 (*)    Hemoglobin 16.3 (*)    HCT 51.2 (*)    All other components within normal limits  CBG MONITORING, ED - Abnormal; Notable for the following components:   Glucose-Capillary 234 (*)    All other components within normal limits  RESP PANEL BY RT-PCR (FLU A&B, COVID) [id number]  URINE CULTURE  LIPASE, BLOOD  LACTIC ACID, PLASMA  TSH  T4, FREE  URINALYSIS, ROUTINE W REFLEX MICROSCOPIC  TROPONIN I (HIGH SENSITIVITY)  TROPONIN I (HIGH SENSITIVITY)     EKG  Interpreted by me Normal sinus rhythm rate of 75, left axis, normal intervals.  Poor R wave progression.  Normal ST segments and T waves.  No acute ischemic changes.  RADIOLOGY Chest x-ray viewed interpreted by me, appears unremarkable.  Radiology report reviewed.  CT scan of the head unremarkable.  MRI brain unremarkable    PROCEDURES:  Critical Care performed: No  Procedures   MEDICATIONS ORDERED IN ED: Medications  sodium chloride 0.9 % bolus 500 mL (500 mLs Intravenous New Bag/Given 09/23/21 1405)     IMPRESSION / MDM / ASSESSMENT AND PLAN / ED COURSE  I reviewed the triage vital signs and the nursing notes.                              Differential diagnosis includes, but is not limited to, UTI, pneumonia, dehydration, vagal episode, electrolyte abnormality     Patient presents with an episode of syncope, vital sign alterations noted in the field.  On arrival her vital signs are normal.  I doubt sepsis based on initial assessment.  Will obtain labs, EKG, chest x-ray, CT head.  Clinical Course as of 09/23/21 1548  Fri Sep 23, 2021  1204 Additional history obtained from the patient's home caregiver who  has been sitting with her and cooking for her while at her nursing home.  She reports that prior to being in the nursing home, the patient is awake and alert, but at the nursing home the patient receives frequent Ativan and is often somnolent just like today.  She suspects that Ativan use explains her current symptoms. [PS]  1547 Caregiver and Sister at bedside notes that pt has had slurred speech and language deficit the last few days as well.  Possible CVA. No acute findings on MRI, but will plan for hospitalist evaluation. Not a candidate for TNK or endovascular intervention due to sx > 24 hours. [PS]    Clinical Course User Index [PS] Carrie Mew, MD    ----------------------------------------- 3:07 PM on 09/23/2021 ----------------------------------------- CT scan of the brain is unremarkable.  MRI is also unremarkable.  Work-up is reassuring and I think patient is stable for discharge back to nursing home.   FINAL CLINICAL IMPRESSION(S) / ED DIAGNOSES   Final diagnoses:  Somnolence  Syncope, unspecified syncope type     Rx / DC Orders   ED Discharge Orders     None        Note:  This document was prepared using Dragon voice recognition software and may include unintentional dictation errors.   Carrie Mew, MD 09/23/21 [phone]

## 2021-09-23 NOTE — H&P (Addendum)
History and Physical    Judith Shaw YBO:[id number] DOB: 02-28-1951 DOA: 09/23/2021  PCP: Pcp, No  Patient coming from: Skilled nursing facility.  History obtained from patient's sister and patient's caregiver.  Chief Complaint: Lethargy.  Loss of consciousness.  HPI: Judith Shaw is a 71 y.o. female with history of diabetes mellitus type 2, hyperlipidemia, depression, sleep apnea noncompliant with CPAP prior history of hysterectomy for uterine CA was brought to the ER from skilled nursing facility after patient had a brief episode of loss of consciousness.  As per the patient's sister patient has become more lethargic over the last 5 days.  Patient has been eating less.  Has been more bedbound and physical therapy has been trying to make her ambulate but finds it difficult.  2 days ago with some of her depression medications were changed her BuSpar dose was increased.  Patient has become more lethargic.  This morning patient was attempted to stand up when patient lost consciousness briefly and fell onto back to her bed.  Since then patient has become more lethargic.  No seizure-like activity was noted.  Patient has not complained of any chest pain shortness of breath nausea vomiting or diarrhea.  ED Course: In the ER patient was found to be very sleepy MRI brain did not show anything acute.  Patient is following commands moving all extremities when patient woke up.  Labs are unremarkable.  Patient is afebrile.  ABG does not show any carbon dioxide retention.  Lactic acid is normal I sensitive troponins are negative.  COVID test negative.  Patient admitted for further observation.  Review of Systems: As per HPI, rest all negative.   Past Medical History:  Diagnosis Date   Anxiety    Cancer ([place])    pt states hx of uterine cancer and had a complete hyst   CHF (congestive heart failure) ([place])    Depression    Diabetes mellitus without complication ([place])    Hyperlipidemia    Hypertension      Past Surgical History:  Procedure Laterality Date   ABDOMINAL HYSTERECTOMY     AMPUTATION Left 12/19/2019   Procedure: AMPUTATION RAY Left 5th;  Surgeon: Caroline More, DPM;  Location: [facility];  Service: Podiatry;  Laterality: Left;   CHOLECYSTECTOMY     LOWER EXTREMITY ANGIOGRAPHY Left 12/17/2019   Procedure: Lower Extremity Angiography;  Surgeon: Algernon Huxley, MD;  Location: [place];  Service: Cardiovascular;  Laterality: Left;     reports that she has never smoked. She has never used smokeless tobacco. She reports that she does not drink alcohol and does not use drugs.  Allergies  Allergen Reactions   Codeine     Family History  Problem Relation Age of Onset   Hypertension Mother    Heart disease Father    Prostate cancer Brother    Diabetes Maternal Aunt    Kidney cancer Neg Hx    Bladder Cancer Neg Hx     Prior to Admission medications   Medication Sig Start Date End Date Taking? Authorizing Provider  acetaminophen (TYLENOL) 325 MG tablet Take 2 tablets (650 mg total) by mouth every 6 (six) hours as needed. 07/18/21   Carrie Mew, MD  aluminum-magnesium hydroxide-simethicone (MAALOX) 200-200-20 MG/5ML SUSP Take 30 mLs by mouth 4 (four) times daily -  before meals and at bedtime. 07/18/21   Carrie Mew, MD  aspirin EC 81 MG tablet Take 81 mg by mouth daily.    [provider]  clotrimazole-betamethasone (LOTRISONE) cream Apply 1 application topically 2 (two) times daily.    [provider]  donepezil (ARICEPT) 10 MG tablet Take 10 mg by mouth at bedtime. 06/14/21   [provider]  DULoxetine (CYMBALTA) 60 MG capsule Take 60 mg by mouth daily. 12/24/19   [provider]  famotidine (PEPCID) 20 MG tablet Take 1 tablet (20 mg total) by mouth daily. 08/18/21   Richarda Osmond, MD  gabapentin (NEURONTIN) 300 MG capsule Take 300 mg by mouth at bedtime.    [provider]  gentian violet 2 % topical  solution Apply 0.5 mLs topically in the morning and at bedtime. Apply between the toes twice daily. 10/20/19   [provider]  Insulin Aspart FlexPen (NOVOLOG) 100 UNIT/ML Inject 0-15 Units into the skin 3 (three) times daily with meals. 05/04/21   [provider]  QUEtiapine (SEROQUEL) 25 MG tablet Take 1 tablet (25 mg total) by mouth at bedtime. 08/02/21   Sharen Hones, MD  simvastatin (ZOCOR) 40 MG tablet Take 40 mg by mouth every morning.     [provider]  TRESIBA FLEXTOUCH 200 UNIT/ML FlexTouch Pen Inject 36 Units into the skin every evening. Between 7-9 pm 08/18/21   Richarda Osmond, MD  TRULICITY 4.5 VW/0.9WJ SOPN Inject 4.5 mg into the skin once a week. 07/26/21   [provider]    Physical Exam: Constitutional: Moderately built and nourished. Vitals:   09/23/21 1530 09/23/21 1535 09/23/21 1600 09/23/21 1615  BP: 129/61  112/61 104/71  Pulse: (!) 57 60 (!) 56 (!) 56  Resp: 14 13 14 14   Temp:      TempSrc:      SpO2:  92% 92% 91%   Eyes: Anicteric no pallor. ENMT: No discharge from the ears eyes nose and mouth. Neck: No neck rigidity.  No mass felt. Respiratory: No rhonchi or crepitations. Cardiovascular: S1-S2 heard. Abdomen: Soft nontender bowel sound present. Musculoskeletal: No edema. Skin: No rash. Neurologic: Lethargy confusion but moving all extremities pupils are reactive to light no facial asymmetry.  Follows commands. Psychiatric: Lethargy.   Labs on Admission: I have personally reviewed following labs and imaging studies  CBC: Recent Labs  Lab 09/23/21 1055  WBC 8.1  NEUTROABS 5.3  HGB 16.3*  HCT 51.2*  MCV 90.5  PLT 191   Basic Metabolic Panel: Recent Labs  Lab 09/23/21 1055  NA 137  K 4.9  CL 101  CO2 27  GLUCOSE 258*  BUN 31*  CREATININE 0.98  CALCIUM 9.6   GFR: CrCl cannot be calculated (Unknown ideal weight.). Liver Function Tests: Recent Labs  Lab 09/23/21 1055  AST 27  ALT 24  ALKPHOS  113  BILITOT 0.5  PROT 7.4  ALBUMIN 3.1*   Recent Labs  Lab 09/23/21 1055  LIPASE 33   No results for input(s): AMMONIA in the last 168 hours. Coagulation Profile: No results for input(s): INR, PROTIME in the last 168 hours. Cardiac Enzymes: No results for input(s): CKTOTAL, CKMB, CKMBINDEX, TROPONINI in the last 168 hours. BNP (last 3 results) No results for input(s): PROBNP in the last 8760 hours. HbA1C: No results for input(s): HGBA1C in the last 72 hours. CBG: Recent Labs  Lab 09/23/21 1137  GLUCAP 234*   Lipid Profile: No results for input(s): CHOL, HDL, LDLCALC, TRIG, CHOLHDL, LDLDIRECT in the last 72 hours. Thyroid Function Tests: Recent Labs    09/23/21 1055  TSH 2.992  FREET4 1.09  Anemia Panel: No results for input(s): VITAMINB12, FOLATE, FERRITIN, TIBC, IRON, RETICCTPCT in the last 72 hours. Urine analysis:    Component Value Date/Time   COLORURINE YELLOW 08/12/2021 1057   APPEARANCEUR HAZY (A) 08/12/2021 1057   APPEARANCEUR Cloudy 05/20/2012 0116   LABSPEC 1.020 08/12/2021 1057   LABSPEC 1.025 05/20/2012 0116   PHURINE 5.0 08/12/2021 1057   GLUCOSEU NEGATIVE 08/12/2021 1057   GLUCOSEU >=500 05/20/2012 0116   HGBUR SMALL (A) 08/12/2021 1057   BILIRUBINUR NEGATIVE 08/12/2021 1057   BILIRUBINUR Negative 05/20/2012 0116   KETONESUR TRACE (A) 08/12/2021 1057   PROTEINUR 30 (A) 08/12/2021 1057   NITRITE NEGATIVE 08/12/2021 1057   LEUKOCYTESUR LARGE (A) 08/12/2021 1057   LEUKOCYTESUR 2+ 05/20/2012 0116   Sepsis Labs: @LABRCNTIP (procalcitonin:4,lacticidven:4) ) Recent Results (from the past 240 hour(s))  Resp Panel by RT-PCR (Flu A&B, Covid) Nasopharyngeal Swab     Status: None   Collection Time: 09/23/21 11:42 AM   Specimen: Nasopharyngeal Swab; Nasopharyngeal(NP) swabs in vial transport medium  Result Value Ref Range Status   SARS Coronavirus 2 by RT PCR NEGATIVE NEGATIVE Final    Comment: (NOTE) SARS-CoV-2 target nucleic acids are NOT  DETECTED.  The SARS-CoV-2 RNA is generally detectable in upper respiratory specimens during the acute phase of infection. The lowest concentration of SARS-CoV-2 viral copies this assay can detect is 138 copies/mL. A negative result does not preclude SARS-Cov-2 infection and should not be used as the sole basis for treatment or other patient management decisions. A negative result may occur with  improper specimen collection/handling, submission of specimen other than nasopharyngeal swab, presence of viral mutation(s) within the areas targeted by this assay, and inadequate number of viral copies(<138 copies/mL). A negative result must be combined with clinical observations, patient history, and epidemiological information. The expected result is Negative.  Fact Sheet for Patients:  EntrepreneurPulse.com.au  Fact Sheet for Healthcare Providers:  IncredibleEmployment.be  This test is no t yet approved or cleared by the [place] FDA and  has been authorized for detection and/or diagnosis of SARS-CoV-2 by FDA under an Emergency Use Authorization (EUA). This EUA will remain  in effect (meaning this test can be used) for the duration of the COVID-19 declaration under Section 564(b)(1) of the Act, 21 U.S.C.section 360bbb-3(b)(1), unless the authorization is terminated  or revoked sooner.       Influenza A by PCR NEGATIVE NEGATIVE Final   Influenza B by PCR NEGATIVE NEGATIVE Final    Comment: (NOTE) The Xpert Xpress SARS-CoV-2/FLU/RSV plus assay is intended as an aid in the diagnosis of influenza from Nasopharyngeal swab specimens and should not be used as a sole basis for treatment. Nasal washings and aspirates are unacceptable for Xpert Xpress SARS-CoV-2/FLU/RSV testing.  Fact Sheet for Patients: EntrepreneurPulse.com.au  Fact Sheet for Healthcare Providers: IncredibleEmployment.be  This test is not yet  approved or cleared by the [place] FDA and has been authorized for detection and/or diagnosis of SARS-CoV-2 by FDA under an Emergency Use Authorization (EUA). This EUA will remain in effect (meaning this test can be used) for the duration of the COVID-19 declaration under Section 564(b)(1) of the Act, 21 U.S.C. section 360bbb-3(b)(1), unless the authorization is terminated or revoked.  Performed at [facility], [address] [place], [place] [address]      Radiological Exams on Admission: CT Head Wo Contrast  Result Date: 09/23/2021 CLINICAL DATA:  Mental status change, unknown cause EXAM: CT HEAD WITHOUT CONTRAST TECHNIQUE: Contiguous axial images were obtained from  the base of the skull through the vertex without intravenous contrast. RADIATION DOSE REDUCTION: This exam was performed according to the departmental dose-optimization program which includes automated exposure control, adjustment of the mA and/or kV according to patient size and/or use of iterative reconstruction technique. COMPARISON:  08/12/2021 FINDINGS: Brain: There is no acute intracranial hemorrhage, mass effect, or edema. Gray-white differentiation is preserved. There is no extra-axial fluid collection. Ventricles and sulci are stable in size and configuration. Minimal patchy low-density in the supratentorial white matter is nonspecific but probably reflects stable minor chronic microvascular ischemic changes. Vascular: There is atherosclerotic calcification at the skull base. Skull: Calvarium is unremarkable. Sinuses/Orbits: No acute finding. Other: None. IMPRESSION: No acute intracranial abnormality. Electronically Signed   By: Macy Mis M.D.   On: 09/23/2021 12:45   MR BRAIN WO CONTRAST  Result Date: 09/23/2021 CLINICAL DATA:  Mental status change, unknown cause EXAM: MRI HEAD WITHOUT CONTRAST TECHNIQUE: Multiplanar, multiecho pulse sequences of the brain and surrounding structures were obtained  without intravenous contrast. COMPARISON:  None. FINDINGS: Brain: There is no acute infarction or intracranial hemorrhage. There is no intracranial mass, mass effect, or edema. There is no hydrocephalus or extra-axial fluid collection. Ventricles and sulci are within normal limits in size and configuration. Patchy foci of T2 hyperintensity in the supratentorial white matter are nonspecific but may reflect minimal chronic microvascular ischemic changes. Vascular: Major vessel flow voids at the skull base are preserved. Skull and upper cervical spine: Normal marrow signal is preserved. Sinuses/Orbits: Paranasal sinuses are aerated. Orbits are unremarkable. Other: Sella is unremarkable.  Mastoid air cells are clear. IMPRESSION: No evidence of recent infarction, hemorrhage, or mass. Electronically Signed   By: Macy Mis M.D.   On: 09/23/2021 14:59   DG Chest Portable 1 View  Result Date: 09/23/2021 CLINICAL DATA:  Weakness EXAM: PORTABLE CHEST 1 VIEW COMPARISON:  Chest x-ray dated August 12, 2021 FINDINGS: Cardiac and mediastinal contours are unchanged. Mild scattered linear opacities which are similar to prior exam and likely due to scarring. No evidence of acute parenchymal opacity. No large pleural effusion or pneumothorax. IMPRESSION: No active disease. Electronically Signed   By: Yetta Glassman M.D.   On: 09/23/2021 11:59    EKG: Independently reviewed.  Normal sinus with incomplete left bundle branch block.  Assessment/Plan Principal Problem:   Acute encephalopathy Active Problems:   Diabetes ([facility])   Chronic diastolic CHF (congestive heart failure) ([facility])   Benign essential HTN   Dementia without behavioral disturbance ([facility])   Syncope   Lethargy    Acute encephalopathy/lethargy -cause not clear but suspect could be polypharmacy.  At this time patient is lethargic and risk for aspiration so holding all patient's oral medications.  Closely observe overnight.  Once patient becomes more  alert and awake restart medications at a lower dose.  We will get EEG ammonia levels CK levels urine drug screen.  UA is pending. Diabetes mellitus type 2 we will keep patient on sliding scale coverage and every 4 check CBG until patient can take orally.  I decreased her long-acting insulin dose to 15 which can be changed back to home dose once patient takes orally. Syncope could be likely due to orthostatic changes.  As per the report patient became syncopal after attempting to stand.  We will closely monitor in telemetry.  2D echo and also D-dimer. History of depression holding all medications (Seroquel, Abilify, BuSpar, Cymbalta, gabapentin) for now until patient is more alert awake.  At that point we  will restart medications at a lower dose. Memory problems as mentioned in the chart. Recently admitted for aspiration and UTI. History of hypertension mention in the chart presently not on any antihypertensives.  We will keep patient n.p.o. and IV hydralazine and follow blood pressure trends. Recently patient was treated for fungal infection of her toes with gentian violet.  Toes are still discolored with the gentian violet.  No signs of any cellulitis.   DVT prophylaxis: Lovenox. Code Status: Full code. Family Communication: Patient's sister at the bedside. Disposition Plan: Back to facility when stable. Consults called: Speech therapy. Admission status: Observation.   Rise Patience MD [facility] Pager [phone].  If 7PM-7AM, please contact night-coverage [email or web address] Password [facility]  09/23/2021, 5:05 PM

## 2021-09-24 ENCOUNTER — Observation Stay (HOSPITAL_COMMUNITY)
Admit: 2021-09-24 | Discharge: 2021-09-24 | Disposition: A | Discharge status: Home / Self Care | Payer: Medicare PPO | Attending: Internal Medicine | Admitting: Internal Medicine

## 2021-09-24 ENCOUNTER — Observation Stay: Payer: Medicare PPO

## 2021-09-24 DIAGNOSIS — R55 Syncope and collapse: Secondary | ICD-10-CM | POA: Diagnosis present

## 2021-09-24 DIAGNOSIS — W19XXXA Unspecified fall, initial encounter: Secondary | ICD-10-CM | POA: Diagnosis present

## 2021-09-24 DIAGNOSIS — L89152 Pressure ulcer of sacral region, stage 2: Secondary | ICD-10-CM | POA: Diagnosis present

## 2021-09-24 DIAGNOSIS — L8990 Pressure ulcer of unspecified site, unspecified stage: Secondary | ICD-10-CM | POA: Insufficient documentation

## 2021-09-24 DIAGNOSIS — I10 Essential (primary) hypertension: Secondary | ICD-10-CM | POA: Diagnosis not present

## 2021-09-24 DIAGNOSIS — F0390 Unspecified dementia without behavioral disturbance: Secondary | ICD-10-CM | POA: Diagnosis not present

## 2021-09-24 DIAGNOSIS — I2699 Other pulmonary embolism without acute cor pulmonale: Secondary | ICD-10-CM | POA: Diagnosis present

## 2021-09-24 DIAGNOSIS — Z9049 Acquired absence of other specified parts of digestive tract: Secondary | ICD-10-CM | POA: Diagnosis not present

## 2021-09-24 DIAGNOSIS — R296 Repeated falls: Secondary | ICD-10-CM | POA: Diagnosis present

## 2021-09-24 DIAGNOSIS — Y92009 Unspecified place in unspecified non-institutional (private) residence as the place of occurrence of the external cause: Secondary | ICD-10-CM | POA: Diagnosis not present

## 2021-09-24 DIAGNOSIS — G9340 Encephalopathy, unspecified: Secondary | ICD-10-CM | POA: Diagnosis not present

## 2021-09-24 DIAGNOSIS — N390 Urinary tract infection, site not specified: Secondary | ICD-10-CM | POA: Diagnosis present

## 2021-09-24 DIAGNOSIS — F0393 Unspecified dementia, unspecified severity, with mood disturbance: Secondary | ICD-10-CM | POA: Diagnosis present

## 2021-09-24 DIAGNOSIS — E669 Obesity, unspecified: Secondary | ICD-10-CM | POA: Diagnosis not present

## 2021-09-24 DIAGNOSIS — I5032 Chronic diastolic (congestive) heart failure: Secondary | ICD-10-CM

## 2021-09-24 DIAGNOSIS — R4182 Altered mental status, unspecified: Secondary | ICD-10-CM

## 2021-09-24 DIAGNOSIS — Z7401 Bed confinement status: Secondary | ICD-10-CM | POA: Diagnosis not present

## 2021-09-24 DIAGNOSIS — F418 Other specified anxiety disorders: Secondary | ICD-10-CM | POA: Diagnosis not present

## 2021-09-24 DIAGNOSIS — R509 Fever, unspecified: Secondary | ICD-10-CM | POA: Diagnosis not present

## 2021-09-24 DIAGNOSIS — U071 COVID-19: Secondary | ICD-10-CM | POA: Diagnosis not present

## 2021-09-24 DIAGNOSIS — Z6838 Body mass index (BMI) 38.0-38.9, adult: Secondary | ICD-10-CM | POA: Diagnosis not present

## 2021-09-24 DIAGNOSIS — I110 Hypertensive heart disease with heart failure: Secondary | ICD-10-CM | POA: Diagnosis present

## 2021-09-24 DIAGNOSIS — E785 Hyperlipidemia, unspecified: Secondary | ICD-10-CM | POA: Diagnosis present

## 2021-09-24 DIAGNOSIS — E440 Moderate protein-calorie malnutrition: Secondary | ICD-10-CM | POA: Diagnosis present

## 2021-09-24 DIAGNOSIS — B961 Klebsiella pneumoniae [K. pneumoniae] as the cause of diseases classified elsewhere: Secondary | ICD-10-CM | POA: Diagnosis present

## 2021-09-24 DIAGNOSIS — I959 Hypotension, unspecified: Secondary | ICD-10-CM | POA: Diagnosis present

## 2021-09-24 DIAGNOSIS — J9801 Acute bronchospasm: Secondary | ICD-10-CM | POA: Diagnosis not present

## 2021-09-24 DIAGNOSIS — L853 Xerosis cutis: Secondary | ICD-10-CM | POA: Diagnosis not present

## 2021-09-24 DIAGNOSIS — E1169 Type 2 diabetes mellitus with other specified complication: Secondary | ICD-10-CM | POA: Diagnosis present

## 2021-09-24 DIAGNOSIS — G9341 Metabolic encephalopathy: Secondary | ICD-10-CM | POA: Diagnosis present

## 2021-09-24 DIAGNOSIS — E1165 Type 2 diabetes mellitus with hyperglycemia: Secondary | ICD-10-CM | POA: Diagnosis not present

## 2021-09-24 DIAGNOSIS — Z794 Long term (current) use of insulin: Secondary | ICD-10-CM | POA: Diagnosis not present

## 2021-09-24 DIAGNOSIS — F0394 Unspecified dementia, unspecified severity, with anxiety: Secondary | ICD-10-CM | POA: Diagnosis present

## 2021-09-24 LAB — CBC WITH DIFFERENTIAL/PLATELET
Abs Immature Granulocytes: 0.02 10*3/uL (ref 0.00–0.07)
Basophils Absolute: 0 10*3/uL (ref 0.0–0.1)
Basophils Relative: 1 %
Eosinophils Absolute: 0.4 10*3/uL (ref 0.0–0.5)
Eosinophils Relative: 5 %
HCT: 47.8 % — ABNORMAL HIGH (ref 36.0–46.0)
Hemoglobin: 15.5 g/dL — ABNORMAL HIGH (ref 12.0–15.0)
Immature Granulocytes: 0 %
Lymphocytes Relative: 24 %
Lymphs Abs: 1.9 10*3/uL (ref 0.7–4.0)
MCH: 29 pg (ref 26.0–34.0)
MCHC: 32.4 g/dL (ref 30.0–36.0)
MCV: 89.5 fL (ref 80.0–100.0)
Monocytes Absolute: 0.6 10*3/uL (ref 0.1–1.0)
Monocytes Relative: 8 %
Neutro Abs: 4.9 10*3/uL (ref 1.7–7.7)
Neutrophils Relative %: 62 %
Platelets: 229 10*3/uL (ref 150–400)
RBC: 5.34 MIL/uL — ABNORMAL HIGH (ref 3.87–5.11)
RDW: 13.5 % (ref 11.5–15.5)
WBC: 7.9 10*3/uL (ref 4.0–10.5)
nRBC: 0 % (ref 0.0–0.2)

## 2021-09-24 LAB — GLUCOSE, CAPILLARY
Glucose-Capillary: 165 mg/dL — ABNORMAL HIGH (ref 70–99)
Glucose-Capillary: 143 mg/dL — ABNORMAL HIGH (ref 70–99)
Glucose-Capillary: 154 mg/dL — ABNORMAL HIGH (ref 70–99)
Glucose-Capillary: 137 mg/dL — ABNORMAL HIGH (ref 70–99)
Glucose-Capillary: 148 mg/dL — ABNORMAL HIGH (ref 70–99)
Glucose-Capillary: 148 mg/dL — ABNORMAL HIGH (ref 70–99)
Glucose-Capillary: 184 mg/dL — ABNORMAL HIGH (ref 70–99)
Glucose-Capillary: 184 mg/dL — ABNORMAL HIGH (ref 70–99)

## 2021-09-24 LAB — COMPREHENSIVE METABOLIC PANEL
ALT: 22 U/L (ref 0–44)
AST: 27 U/L (ref 15–41)
Albumin: 3 g/dL — ABNORMAL LOW (ref 3.5–5.0)
Alkaline Phosphatase: 103 U/L (ref 38–126)
Anion gap: 10 (ref 5–15)
BUN: 32 mg/dL — ABNORMAL HIGH (ref 8–23)
CO2: 26 mmol/L (ref 22–32)
Calcium: 9.5 mg/dL (ref 8.9–10.3)
Chloride: 102 mmol/L (ref 98–111)
Creatinine, Ser: 0.96 mg/dL (ref 0.44–1.00)
GFR, Estimated: >60 mL/min (ref >60)
Glucose, Bld: 166 mg/dL — ABNORMAL HIGH (ref 70–99)
Potassium: 4.7 mmol/L (ref 3.5–5.1)
Sodium: 138 mmol/L (ref 135–145)
Total Bilirubin: 0.4 mg/dL (ref 0.3–1.2)
Total Protein: 6.8 g/dL (ref 6.5–8.1)

## 2021-09-24 LAB — URINALYSIS, COMPLETE (UACMP) WITH MICROSCOPIC
Glucose, UA: NEGATIVE mg/dL
Nitrite: NEGATIVE
Protein, ur: 100 mg/dL — AB
Specific Gravity, Urine: 1.02 (ref 1.005–1.030)
WBC, UA: >50 WBC/hpf (ref 0–5)
pH: 5 (ref 5.0–8.0)

## 2021-09-24 LAB — URINE DRUG SCREEN, QUALITATIVE (ARMC ONLY)
Amphetamines, Ur Screen: NOT DETECTED
Barbiturates, Ur Screen: NOT DETECTED
Benzodiazepine, Ur Scrn: NOT DETECTED
Cannabinoid 50 Ng, Ur ~~LOC~~: NOT DETECTED
Cocaine Metabolite,Ur ~~LOC~~: NOT DETECTED
MDMA (Ecstasy)Ur Screen: NOT DETECTED
Methadone Scn, Ur: NOT DETECTED
Opiate, Ur Screen: NOT DETECTED
Phencyclidine (PCP) Ur S: NOT DETECTED
Tricyclic, Ur Screen: POSITIVE — AB

## 2021-09-24 LAB — ECHOCARDIOGRAM COMPLETE
AV Peak grad: 8.4 mmHg
Ao pk vel: 1.45 m/s
Area-P 1/2: 2.9 cm2
Calc EF: 66.9 %
S' Lateral: 3 cm
Single Plane A2C EF: 63.8 %
Single Plane A4C EF: 69.6 %
Weight: 3460.34 oz

## 2021-09-24 LAB — PROTIME-INR
INR: 1.1 (ref 0.8–1.2)
Prothrombin Time: 14.2 seconds (ref 11.4–15.2)

## 2021-09-24 LAB — HEPARIN LEVEL (UNFRACTIONATED)
Heparin Unfractionated: 0.86 IU/mL — ABNORMAL HIGH (ref 0.30–0.70)
Heparin Unfractionated: 0.69 IU/mL (ref 0.30–0.70)

## 2021-09-24 LAB — APTT: aPTT: 70 seconds — ABNORMAL HIGH (ref 24–36)

## 2021-09-24 LAB — CORTISOL: Cortisol, Plasma: 9.8 ug/dL

## 2021-09-24 MED ORDER — HEPARIN (PORCINE) 25000 UT/250ML-% IV SOLN
1150.0000 [IU]/h | INTRAVENOUS | Status: DC
  Administered 2021-09-24: 06:00:00 1300 [IU]/h via INTRAVENOUS
  Administered 2021-09-25: 1150 [IU]/h via INTRAVENOUS
  Filled 2021-09-24 (×2): qty 250

## 2021-09-24 MED ORDER — IOHEXOL 350 MG/ML SOLN
80.0000 mL | Freq: Once | INTRAVENOUS | Status: AC | PRN
  Administered 2021-09-24: 05:00:00 80 mL via INTRAVENOUS

## 2021-09-24 NOTE — Procedures (Signed)
Patient Name: JAKHIYA BROWER  MRN: [id number]  Epilepsy Attending: Lora Havens  Referring Physician/Provider: Rise Patience, MD Date: 09/24/2021 Duration: 20.17 mins  Patient history: 71yo F with ams and syncope. EEG to evaluate for seizure  Level of alertness: Awake  AEDs during EEG study: None  Technical aspects: This EEG study was done with scalp electrodes positioned according to the 10-20 International system of electrode placement. Electrical activity was acquired at a sampling rate of 500Hz  and reviewed with a high frequency filter of 70Hz  and a low frequency filter of 1Hz . EEG data were recorded continuously and digitally stored.   Description: The posterior dominant rhythm consists of 7Hz  activity of moderate voltage (25-35 uV) seen predominantly in posterior head regions, symmetric and reactive to eye opening and eye closing. EEG showed continuous generalized 5 to 7 Hz theta slowing. Physiologic photic driving was not seen during photic stimulation.  Hyperventilation was not performed.     ABNORMALITY - Continuous slow, generalized - Background slow  IMPRESSION: This study is suggestive of mild to moderate diffuse encephalopathy, nonspecific etiology. No seizures or epileptiform discharges were seen throughout the recording.  Delford Wingert Barbra Sarks

## 2021-09-24 NOTE — Progress Notes (Signed)
Eeg done 

## 2021-09-24 NOTE — Progress Notes (Signed)
Patient alert and oriented, drowsy at times. Oxygen decreased to 85-88% while asleep, 2L of oxygen applied with improvement. Elevated d-dimer noted, hospitalist notified, CT angiogram ordered, heparin gtt initiated per order, will continue to monitor.

## 2021-09-24 NOTE — Progress Notes (Addendum)
Cross Cover Admission follow up - D Dimer elevate 6.85. CTA chest rule out PE ordered 0520 09/24/2021 - page from [facility] Radiology CT + PE Bilateral all lobes. No right heart strain noted yet Heparin per pharmacy consult initiated Currently patient SR/SB BP stable, respirations unlabored and oxygen saturations stable on 2 L supplemental O2 via nasal cannula

## 2021-09-24 NOTE — Progress Notes (Signed)
ANTICOAGULATION CONSULT NOTE   Pharmacy Consult for heparin infusion Indication: pulmonary embolus  Allergies  Allergen Reactions   Codeine     Patient Measurements: Weight: 98.1 kg (216 lb 4.3 oz) Heparin Dosing Weight: 79.3 kg  Vital Signs: Temp: 98.1 F (36.7 C) (01/28 1131) Temp Source: Oral (01/28 0507) BP: 171/89 (01/28 1131) Pulse Rate: 80 (01/28 1131)  Labs: Recent Labs    09/23/21 1055 09/23/21 1405 09/23/21 2029 09/24/21 0507 09/24/21 1417  HGB 16.3*  --  16.3* 15.5*  --   HCT 51.2*  --  51.1* 47.8*  --   PLT 238  --  227 229  --   APTT  --   --   --   --  70*  LABPROT  --   --   --   --  14.2  INR  --   --   --   --  1.1  HEPARINUNFRC  --   --   --   --  0.86*  CREATININE 0.98  --  1.08* 0.96  --   CKTOTAL  --   --  242*  --   --   TROPONINIHS 9 8  --   --   --      Estimated Creatinine Clearance: 62.3 mL/min (by C-G formula based on SCr of 0.96 mg/dL).   Medical History: Past Medical History:  Diagnosis Date   Anxiety    Cancer ([place])    pt states hx of uterine cancer and had a complete hyst   CHF (congestive heart failure) ([place])    Depression    Diabetes mellitus without complication ([place])    Hyperlipidemia    Hypertension     Medications:  Pt given Lovenox 40 mg x 1 dose 1/27 @ 2206  Assessment: Pt is 71 yo female presenting to ED due to lethargy & loss of consciousness, being started on heparin pending CTA chest to rule out PE.  1/28 1417 HL 0.86  Goal of Therapy:  Heparin level 0.3-0.7 units/ml Monitor platelets by anticoagulation protocol: Yes   Plan:  Heparin level is supratherapeutic. Will decrease the heparin infusion to 1150 units/hr. Recheck heparin level in 8 hours. CBC daily while on heparin.   Eleonore Chiquito, PharmD  09/24/2021 2:47 PM

## 2021-09-24 NOTE — Progress Notes (Signed)
Chart reviewed, Pt visited. New dx of Bil PE. Pt was sleeping when ST entered. She had removed her oxygen, sats at 86. Oxygen replaced, sats improved above 90. Discussed need for oxygen with Pt. Pt needed cues to stay awake for discusssion. Attempted to position bed upright for swallowing assessment. Pt insisted ST lower the bed and said she could not perform a swallow eval as she was too tired. Pt agreed to try again tomorrow. Noted tray at bedside with only a few bites eaten. Will downgrade diet to Dys 2 chopped for now in the event that Pt is alert enough for a meal. Posted precautions above bed. Reattempt swallow eval in am.

## 2021-09-24 NOTE — Progress Notes (Signed)
ANTICOAGULATION CONSULT NOTE   Pharmacy Consult for heparin infusion Indication: pulmonary embolus  Allergies  Allergen Reactions   Codeine     Patient Measurements: Weight: 98.1 kg (216 lb 4.3 oz) Heparin Dosing Weight: 79.3 kg  Vital Signs: Temp: 97.6 F (36.4 C) (01/28 0507) Temp Source: Oral (01/28 0507) BP: 149/83 (01/28 0507) Pulse Rate: 63 (01/28 0507)  Labs: Recent Labs    09/23/21 1055 09/23/21 1405 09/23/21 2029 09/24/21 0507  HGB 16.3*  --  16.3* 15.5*  HCT 51.2*  --  51.1* 47.8*  PLT 238  --  227 229  CREATININE 0.98  --  1.08* 0.96  CKTOTAL  --   --  242*  --   TROPONINIHS 9 8  --   --     Estimated Creatinine Clearance: 62.3 mL/min (by C-G formula based on SCr of 0.96 mg/dL).   Medical History: Past Medical History:  Diagnosis Date   Anxiety    Cancer ([place])    pt states hx of uterine cancer and had a complete hyst   CHF (congestive heart failure) ([place])    Depression    Diabetes mellitus without complication ([facility])    Hyperlipidemia    Hypertension     Medications:  Pt given Lovenox 40 mg x 1 dose 1/27 @ 2206  Assessment: Pt is 71 yo female presenting to ED due to lethargy & loss of consciousness, being started on heparin pending CTA chest to rule out PE.  Goal of Therapy:  Heparin level 0.3-0.7 units/ml Monitor platelets by anticoagulation protocol: Yes   Plan:  Due to Lovenox < 12 hrs prior, no initial bolus Start heparin infusion at 1300 units/hr Will check HL/aPTT/INR in 8 hours after start of infusion CBC daily while on heparin.  Renda Rolls, PharmD, [facility] 09/24/2021 6:10 AM

## 2021-09-24 NOTE — Progress Notes (Signed)
PT Cancellation Note  Patient Details Name: CHARLOTTA LAPAGLIA MRN: [id number] DOB: 02-Feb-1951   Cancelled Treatment:    Reason Eval/Treat Not Completed: Medical issues which prohibited therapy (Consult received and chart reviewed. Noted with newly diagnosed bilat PE per imaging. Per guidelines, will require anticoag x48 hours prior to initiation of exertional activity. Will continue to follow and initiate as appropriate.)   Stuti Sandin H. Owens Shark, PT, DPT, NCS 09/24/21, 10:28 AM [phone]

## 2021-09-24 NOTE — Progress Notes (Signed)
ANTICOAGULATION CONSULT NOTE   Pharmacy Consult for heparin infusion Indication: pulmonary embolus  Allergies  Allergen Reactions   Codeine     Patient Measurements: Weight: 98.1 kg (216 lb 4.3 oz) Heparin Dosing Weight: 79.3 kg  Vital Signs: Temp: 97.7 F (36.5 C) (01/28 1928) Temp Source: Oral (01/28 1928) BP: 157/91 (01/28 1928) Pulse Rate: 83 (01/28 1928)  Labs: Recent Labs    09/23/21 1055 09/23/21 1405 09/23/21 2029 09/24/21 0507 09/24/21 1417 09/24/21 2308  HGB 16.3*  --  16.3* 15.5*  --   --   HCT 51.2*  --  51.1* 47.8*  --   --   PLT 238  --  227 229  --   --   APTT  --   --   --   --  70*  --   LABPROT  --   --   --   --  14.2  --   INR  --   --   --   --  1.1  --   HEPARINUNFRC  --   --   --   --  0.86* 0.69  CREATININE 0.98  --  1.08* 0.96  --   --   CKTOTAL  --   --  242*  --   --   --   TROPONINIHS 9 8  --   --   --   --      Estimated Creatinine Clearance: 62.3 mL/min (by C-G formula based on SCr of 0.96 mg/dL).   Medical History: Past Medical History:  Diagnosis Date   Anxiety    Cancer ([place])    pt states hx of uterine cancer and had a complete hyst   CHF (congestive heart failure) ([place])    Depression    Diabetes mellitus without complication ([place])    Hyperlipidemia    Hypertension     Medications:  Pt given Lovenox 40 mg x 1 dose 1/27 @ 2206  Assessment: Pt is 71 yo female presenting to ED due to lethargy & loss of consciousness, being started on heparin pending CTA chest to rule out PE.  1/28 1417 HL 0.86 1/28 2308 HL 0.69, therapeutic x 1  Goal of Therapy:  Heparin level 0.3-0.7 units/ml Monitor platelets by anticoagulation protocol: Yes   Plan:  Heparin level is therapeutic. Will continue the heparin infusion at 1150 units/hr. Recheck heparin level w/ AM labs to confirm. CBC daily while on heparin.   Renda Rolls, PharmD, [facility] 09/24/2021 11:56 PM

## 2021-09-24 NOTE — Progress Notes (Signed)
PROGRESS NOTE    Judith Shaw  BDZ:[id number] DOB: 01-06-1951 DOA: 09/23/2021 PCP: Pcp, No    Brief Narrative:   Judith Shaw is a 71 y.o. female with history of diabetes mellitus type 2, hyperlipidemia, depression, sleep apnea noncompliant with CPAP prior history of hysterectomy for uterine CA was brought to the ER from skilled nursing facility after patient had a brief episode of loss of consciousness. MRI brain did not show any acute changes. She has elevated D-dimer, CT angiogram chest showed bilateral PE.  She is started on IV heparin.   Assessment & Plan:   Principal Problem:   Acute encephalopathy Active Problems:   Diabetes (HCC)   Chronic diastolic CHF (congestive heart failure) (HCC)   Benign essential HTN   Dementia without behavioral disturbance (HCC)   Syncope   Lethargy   Pressure injury of skin   Bilateral pulmonary embolism (HCC)  Bilateral pulm emboli. Reviewed the patient CT angiogram results, patient has moderate burden of bilateral PE. Currently does not have hypoxemia. Patient is a placed on IV heparin, will continue for another day.  Planning to change to Eliquis at time of discharge.  Acute encephalopathy. Syncope. Generalized weakness. Frequent falls. Probably due to bilateral pulmonary emboli. Patient mental status has improved. Patient states that he week, she has been falling at home.  We will start physical therapy.  Patient also has echocardiogram pending.  Type 2 diabetes. No hypoglycemia, continue current regimen.  Essential hypertension Continue home medicines.  DVT prophylaxis: Heparin drip Code Status: Full Family Communication: sister updated Disposition Plan: going back to snf.    Status is: Inpatient  Remains inpatient appropriate because: severity of disease, iv treatment   I/O last 3 completed shifts: In: -  Out: 250 [Urine:250] No intake/output data recorded.     Consultants:  none  Procedures:  none  Antimicrobials:none  Subjective: Spoke with patient sister, patient actually came from nursing home which patient did not remember.  She has significant weakness, she had a frequent falls at home. Currently he denies any short of breath or cough. No chest pain palpitation  No dysuria hematuria.  Objective: Vitals:   09/23/21 2349 09/24/21 0507 09/24/21 0532 09/24/21 0838  BP: 96/84 (!) 149/83  (!) 170/80  Pulse: 62 63  77  Resp: 14 16  19   Temp: 97.8 F (36.6 C) 97.6 F (36.4 C)  97.7 F (36.5 C)  TempSrc: Oral Oral    SpO2: 97% 97%  92%  Weight:   98.1 kg     Intake/Output Summary (Last 24 hours) at 09/24/2021 1036 Last data filed at 09/24/2021 0425 Gross per 24 hour  Intake --  Output 250 ml  Net -250 ml   Filed Weights   09/24/21 0532  Weight: 98.1 kg    Examination:  General exam: Appears calm and comfortable  Respiratory system: Clear to auscultation. Respiratory effort normal. Cardiovascular system: S1 & S2 heard, RRR. No JVD, murmurs, rubs, gallops or clicks. No pedal edema. Gastrointestinal system: Abdomen is nondistended, soft and nontender. No organomegaly or masses felt. Normal bowel sounds heard. Central nervous system: Alert and oriented x3. No focal neurological deficits. Extremities: Symmetric 5 x 5 power. Skin: No rashes, lesions or ulcers Psychiatry: Judgement and insight appear normal. Mood & affect appropriate.     Data Reviewed: I have personally reviewed following labs and imaging studies  CBC: Recent Labs  Lab 09/23/21 1055 09/23/21 2029 09/24/21 0507  WBC 8.1 7.6 7.9  NEUTROABS 5.3  --  4.9  HGB 16.3* 16.3* 15.5*  HCT 51.2* 51.1* 47.8*  MCV 90.5 90.8 89.5  PLT 238 227 128   Basic Metabolic Panel: Recent Labs  Lab 09/23/21 1055 09/23/21 2029 09/24/21 0507  NA 137  --  138  K 4.9  --  4.7  CL 101  --  102  CO2 27  --  26  GLUCOSE 258*  --  166*  BUN 31*  --  32*  CREATININE 0.98 1.08* 0.96  CALCIUM 9.6  --  9.5    GFR: Estimated Creatinine Clearance: 62.3 mL/min (by C-G formula based on SCr of 0.96 mg/dL). Liver Function Tests: Recent Labs  Lab 09/23/21 1055 09/24/21 0507  AST 27 27  ALT 24 22  ALKPHOS 113 103  BILITOT 0.5 0.4  PROT 7.4 6.8  ALBUMIN 3.1* 3.0*   Recent Labs  Lab 09/23/21 1055  LIPASE 33   Recent Labs  Lab 09/23/21 2029  AMMONIA 11   Coagulation Profile: No results for input(s): INR, PROTIME in the last 168 hours. Cardiac Enzymes: Recent Labs  Lab 09/23/21 2029  CKTOTAL 242*   BNP (last 3 results) No results for input(s): PROBNP in the last 8760 hours. HbA1C: No results for input(s): HGBA1C in the last 72 hours. CBG: Recent Labs  Lab 09/23/21 1840 09/23/21 2202 09/24/21 0140 09/24/21 0520 09/24/21 0902  GLUCAP 185* 181* 165* 143* 154*   Lipid Profile: No results for input(s): CHOL, HDL, LDLCALC, TRIG, CHOLHDL, LDLDIRECT in the last 72 hours. Thyroid Function Tests: Recent Labs    09/23/21 1055  TSH 2.992  FREET4 1.09   Anemia Panel: No results for input(s): VITAMINB12, FOLATE, FERRITIN, TIBC, IRON, RETICCTPCT in the last 72 hours. Sepsis Labs: Recent Labs  Lab 09/23/21 1155  LATICACIDVEN 1.5    Recent Results (from the past 240 hour(s))  Resp Panel by RT-PCR (Flu A&B, Covid) Nasopharyngeal Swab     Status: None   Collection Time: 09/23/21 11:42 AM   Specimen: Nasopharyngeal Swab; Nasopharyngeal(NP) swabs in vial transport medium  Result Value Ref Range Status   SARS Coronavirus 2 by RT PCR NEGATIVE NEGATIVE Final    Comment: (NOTE) SARS-CoV-2 target nucleic acids are NOT DETECTED.  The SARS-CoV-2 RNA is generally detectable in upper respiratory specimens during the acute phase of infection. The lowest concentration of SARS-CoV-2 viral copies this assay can detect is 138 copies/mL. A negative result does not preclude SARS-Cov-2 infection and should not be used as the sole basis for treatment or other patient management decisions. A  negative result may occur with  improper specimen collection/handling, submission of specimen other than nasopharyngeal swab, presence of viral mutation(s) within the areas targeted by this assay, and inadequate number of viral copies(<138 copies/mL). A negative result must be combined with clinical observations, patient history, and epidemiological information. The expected result is Negative.  Fact Sheet for Patients:  EntrepreneurPulse.com.au  Fact Sheet for Healthcare Providers:  IncredibleEmployment.be  This test is no t yet approved or cleared by the [place] FDA and  has been authorized for detection and/or diagnosis of SARS-CoV-2 by FDA under an Emergency Use Authorization (EUA). This EUA will remain  in effect (meaning this test can be used) for the duration of the COVID-19 declaration under Section 564(b)(1) of the Act, 21 U.S.C.section 360bbb-3(b)(1), unless the authorization is terminated  or revoked sooner.       Influenza A by PCR NEGATIVE NEGATIVE Final   Influenza B by PCR NEGATIVE NEGATIVE Final  Comment: (NOTE) The Xpert Xpress SARS-CoV-2/FLU/RSV plus assay is intended as an aid in the diagnosis of influenza from Nasopharyngeal swab specimens and should not be used as a sole basis for treatment. Nasal washings and aspirates are unacceptable for Xpert Xpress SARS-CoV-2/FLU/RSV testing.  Fact Sheet for Patients: EntrepreneurPulse.com.au  Fact Sheet for Healthcare Providers: IncredibleEmployment.be  This test is not yet approved or cleared by the [place] FDA and has been authorized for detection and/or diagnosis of SARS-CoV-2 by FDA under an Emergency Use Authorization (EUA). This EUA will remain in effect (meaning this test can be used) for the duration of the COVID-19 declaration under Section 564(b)(1) of the Act, 21 U.S.C. section 360bbb-3(b)(1), unless the authorization  is terminated or revoked.  Performed at [facility], [address], [place], [place] [address]          Radiology Studies: CT Head Wo Contrast  Result Date: 09/23/2021 CLINICAL DATA:  Mental status change, unknown cause EXAM: CT HEAD WITHOUT CONTRAST TECHNIQUE: Contiguous axial images were obtained from the base of the skull through the vertex without intravenous contrast. RADIATION DOSE REDUCTION: This exam was performed according to the departmental dose-optimization program which includes automated exposure control, adjustment of the mA and/or kV according to patient size and/or use of iterative reconstruction technique. COMPARISON:  08/12/2021 FINDINGS: Brain: There is no acute intracranial hemorrhage, mass effect, or edema. Gray-white differentiation is preserved. There is no extra-axial fluid collection. Ventricles and sulci are stable in size and configuration. Minimal patchy low-density in the supratentorial white matter is nonspecific but probably reflects stable minor chronic microvascular ischemic changes. Vascular: There is atherosclerotic calcification at the skull base. Skull: Calvarium is unremarkable. Sinuses/Orbits: No acute finding. Other: None. IMPRESSION: No acute intracranial abnormality. Electronically Signed   By: Macy Mis M.D.   On: 09/23/2021 12:45   CT Angio Chest Pulmonary Embolism (PE) W or WO Contrast  Result Date: 09/24/2021 CLINICAL DATA:  Positive D-dimer.  Suspected pulmonary embolism. EXAM: CT ANGIOGRAPHY CHEST WITH CONTRAST TECHNIQUE: Multidetector CT imaging of the chest was performed using the standard protocol during bolus administration of intravenous contrast. Multiplanar CT image reconstructions and MIPs were obtained to evaluate the vascular anatomy. RADIATION DOSE REDUCTION: This exam was performed according to the departmental dose-optimization program which includes automated exposure control, adjustment of the mA and/or kV according  to patient size and/or use of iterative reconstruction technique. CONTRAST:  68mL OMNIPAQUE IOHEXOL 350 MG/ML SOLN COMPARISON:  CTA chest studies 08/13/2021 and 07/27/2021 FINDINGS: Cardiovascular: There is mild cardiomegaly. The pulmonary arteries are normal in caliber. There is no saddle embolus, but there is acute embolic disease with partially occlusive embolus in the distal right main pulmonary artery, occluding embolus in the interlobar pulmonary artery and partially occluding embolus in the medial and lateral segmental main arteries to the right middle lobe. In the right lower lobe there is additional non occluding embolus in the main artery, in at least 3 segmental divisions and in several downstream posterior basal subsegmental arteries. There are additional at least 1 segmental and 2 subsegmental arteries containing thrombus in the right upper lobe, segmental thrombus in the left upper lobe superior lingular and a few subsegmental lingular segments, and left lower lobe nonocclusive thrombus in the main artery, 3 segmental and several downstream posterior basal subsegmental arteries. There is an overall moderate clot burden but no appreciable right heart strain findings. There is no aortic aneurysm, significant atherosclerosis or dissection , no great vessel stenosis. The pulmonary veins are  normal. Mediastinum/Nodes: No enlarged mediastinal, hilar, or axillary lymph nodes. Thyroid gland, trachea, and esophagus demonstrate no significant findings. Lungs/Pleura: Posterior basal atelectasis is similar to prior studies. Small peripheral ground-glass opacities laterally in the base of the right upper lobe are also unchanged. Probable scarring. The lungs are otherwise clear. There is no pleural effusion, thickening or pneumothorax. The trachea and central airways are clear. Upper Abdomen: No acute abnormality. Musculoskeletal: No chest wall abnormality. No acute or significant osseous findings. Review of the MIP  images confirms the above findings. IMPRESSION: 1. Bilateral pulmonary arterial emboli with overall moderate clot burden, but no arterial dilatation or acute right heart strain findings. 2. Mild cardiomegaly, but the heart is smaller than previously. 3. Stable appearance of lower lobe atelectasis. No focal infiltrate is seen. 4. Results phoned to nurse practitioner Sharion Settler at 5:19 a.m., 09/24/2021. Electronically Signed   By: Telford Nab M.D.   On: 09/24/2021 05:19   MR BRAIN WO CONTRAST  Result Date: 09/23/2021 CLINICAL DATA:  Mental status change, unknown cause EXAM: MRI HEAD WITHOUT CONTRAST TECHNIQUE: Multiplanar, multiecho pulse sequences of the brain and surrounding structures were obtained without intravenous contrast. COMPARISON:  None. FINDINGS: Brain: There is no acute infarction or intracranial hemorrhage. There is no intracranial mass, mass effect, or edema. There is no hydrocephalus or extra-axial fluid collection. Ventricles and sulci are within normal limits in size and configuration. Patchy foci of T2 hyperintensity in the supratentorial white matter are nonspecific but may reflect minimal chronic microvascular ischemic changes. Vascular: Major vessel flow voids at the skull base are preserved. Skull and upper cervical spine: Normal marrow signal is preserved. Sinuses/Orbits: Paranasal sinuses are aerated. Orbits are unremarkable. Other: Sella is unremarkable.  Mastoid air cells are clear. IMPRESSION: No evidence of recent infarction, hemorrhage, or mass. Electronically Signed   By: Macy Mis M.D.   On: 09/23/2021 14:59   DG Chest Portable 1 View  Result Date: 09/23/2021 CLINICAL DATA:  Weakness EXAM: PORTABLE CHEST 1 VIEW COMPARISON:  Chest x-ray dated August 12, 2021 FINDINGS: Cardiac and mediastinal contours are unchanged. Mild scattered linear opacities which are similar to prior exam and likely due to scarring. No evidence of acute parenchymal opacity. No large pleural  effusion or pneumothorax. IMPRESSION: No active disease. Electronically Signed   By: Yetta Glassman M.D.   On: 09/23/2021 11:59        Scheduled Meds:  insulin aspart  0-9 Units Subcutaneous Q4H   insulin glargine-yfgn  15 Units Subcutaneous QHS   Continuous Infusions:  heparin 1,300 Units/hr (09/24/21 0618)   lactated ringers 50 mL/hr at 09/23/21 1836     LOS: 0 days    Time spent: 34 minutes    Sharen Hones, MD [facility]   To contact the attending provider between 7A-7P or the covering provider during after hours 7P-7A, please log into the web site [email or web address] and access using universal [place] password for that web site. If you do not have the password, please call the hospital operator.  09/24/2021, 10:36 AM

## 2021-09-25 DIAGNOSIS — I2699 Other pulmonary embolism without acute cor pulmonale: Secondary | ICD-10-CM | POA: Diagnosis not present

## 2021-09-25 DIAGNOSIS — G9340 Encephalopathy, unspecified: Secondary | ICD-10-CM | POA: Diagnosis not present

## 2021-09-25 DIAGNOSIS — I5032 Chronic diastolic (congestive) heart failure: Secondary | ICD-10-CM | POA: Diagnosis not present

## 2021-09-25 LAB — GLUCOSE, CAPILLARY
Glucose-Capillary: 130 mg/dL — ABNORMAL HIGH (ref 70–99)
Glucose-Capillary: 133 mg/dL — ABNORMAL HIGH (ref 70–99)
Glucose-Capillary: 133 mg/dL — ABNORMAL HIGH (ref 70–99)
Glucose-Capillary: 145 mg/dL — ABNORMAL HIGH (ref 70–99)
Glucose-Capillary: 151 mg/dL — ABNORMAL HIGH (ref 70–99)
Glucose-Capillary: 165 mg/dL — ABNORMAL HIGH (ref 70–99)
Glucose-Capillary: 186 mg/dL — ABNORMAL HIGH (ref 70–99)

## 2021-09-25 LAB — CBC
HCT: 46.6 % — ABNORMAL HIGH (ref 36.0–46.0)
Hemoglobin: 15.2 g/dL — ABNORMAL HIGH (ref 12.0–15.0)
MCH: 28.7 pg (ref 26.0–34.0)
MCHC: 32.6 g/dL (ref 30.0–36.0)
MCV: 87.9 fL (ref 80.0–100.0)
Platelets: 242 10*3/uL (ref 150–400)
RBC: 5.3 MIL/uL — ABNORMAL HIGH (ref 3.87–5.11)
RDW: 13.5 % (ref 11.5–15.5)
WBC: 9.4 10*3/uL (ref 4.0–10.5)
nRBC: 0 % (ref 0.0–0.2)

## 2021-09-25 LAB — HEPARIN LEVEL (UNFRACTIONATED): Heparin Unfractionated: 0.53 IU/mL (ref 0.30–0.70)

## 2021-09-25 MED ORDER — APIXABAN 5 MG PO TABS
5.0000 mg | ORAL_TABLET | Freq: Two times a day (BID) | ORAL | Status: DC
  Administered 2021-10-02 – 2021-10-21 (×39): 5 mg via ORAL
  Filled 2021-09-25 (×39): qty 1

## 2021-09-25 MED ORDER — INSULIN ASPART 100 UNIT/ML IJ SOLN
0.0000 [IU] | Freq: Three times a day (TID) | INTRAMUSCULAR | Status: DC
  Administered 2021-09-25 – 2021-09-26 (×5): 2 [IU] via SUBCUTANEOUS
  Administered 2021-09-27: 09:00:00 1 [IU] via SUBCUTANEOUS
  Administered 2021-09-27: 2 [IU] via SUBCUTANEOUS
  Administered 2021-09-28: 1 [IU] via SUBCUTANEOUS
  Administered 2021-09-28: 12:00:00 3 [IU] via SUBCUTANEOUS
  Administered 2021-09-28: 1 [IU] via SUBCUTANEOUS
  Administered 2021-09-29: 18:00:00 2 [IU] via SUBCUTANEOUS
  Administered 2021-10-01: 14:00:00 3 [IU] via SUBCUTANEOUS
  Administered 2021-10-01: 17:00:00 2 [IU] via SUBCUTANEOUS
  Administered 2021-10-02: 09:00:00 1 [IU] via SUBCUTANEOUS
  Administered 2021-10-02 – 2021-10-04 (×5): 2 [IU] via SUBCUTANEOUS
  Administered 2021-10-04 (×2): 1 [IU] via SUBCUTANEOUS
  Administered 2021-10-05 – 2021-10-06 (×2): 2 [IU] via SUBCUTANEOUS
  Administered 2021-10-06: 1 [IU] via SUBCUTANEOUS
  Administered 2021-10-06 – 2021-10-07 (×2): 3 [IU] via SUBCUTANEOUS
  Administered 2021-10-07 (×2): 2 [IU] via SUBCUTANEOUS
  Administered 2021-10-08: 3 [IU] via SUBCUTANEOUS
  Administered 2021-10-08 (×2): 2 [IU] via SUBCUTANEOUS
  Administered 2021-10-09: 12:00:00 3 [IU] via SUBCUTANEOUS
  Administered 2021-10-09: 17:00:00 5 [IU] via SUBCUTANEOUS
  Administered 2021-10-10: 2 [IU] via SUBCUTANEOUS
  Administered 2021-10-10: 18:00:00 3 [IU] via SUBCUTANEOUS
  Administered 2021-10-10 – 2021-10-11 (×2): 2 [IU] via SUBCUTANEOUS
  Administered 2021-10-11: 10:00:00 1 [IU] via SUBCUTANEOUS
  Administered 2021-10-11 – 2021-10-12 (×2): 2 [IU] via SUBCUTANEOUS
  Administered 2021-10-12: 09:00:00 1 [IU] via SUBCUTANEOUS
  Administered 2021-10-12 – 2021-10-13 (×2): 3 [IU] via SUBCUTANEOUS
  Administered 2021-10-13: 12:00:00 1 [IU] via SUBCUTANEOUS
  Administered 2021-10-14: 12:00:00 3 [IU] via SUBCUTANEOUS
  Administered 2021-10-14: 10:00:00 1 [IU] via SUBCUTANEOUS
  Administered 2021-10-14 – 2021-10-15 (×2): 9 [IU] via SUBCUTANEOUS
  Administered 2021-10-15: 5 [IU] via SUBCUTANEOUS
  Administered 2021-10-15 – 2021-10-16 (×4): 3 [IU] via SUBCUTANEOUS
  Administered 2021-10-17: 09:00:00 1 [IU] via SUBCUTANEOUS
  Administered 2021-10-17: 5 [IU] via SUBCUTANEOUS
  Administered 2021-10-17: 13:00:00 3 [IU] via SUBCUTANEOUS
  Administered 2021-10-18: 17:00:00 7 [IU] via SUBCUTANEOUS
  Administered 2021-10-18 (×2): 2 [IU] via SUBCUTANEOUS
  Administered 2021-10-19: 17:00:00 5 [IU] via SUBCUTANEOUS
  Administered 2021-10-19: 09:00:00 2 [IU] via SUBCUTANEOUS
  Administered 2021-10-19: 12:00:00 3 [IU] via SUBCUTANEOUS
  Administered 2021-10-20: 7 [IU] via SUBCUTANEOUS
  Administered 2021-10-20: 15:00:00 5 [IU] via SUBCUTANEOUS
  Administered 2021-10-20: 3 [IU] via SUBCUTANEOUS
  Administered 2021-10-21: 09:00:00 2 [IU] via SUBCUTANEOUS
  Filled 2021-09-25 (×63): qty 1

## 2021-09-25 MED ORDER — APIXABAN 5 MG PO TABS
10.0000 mg | ORAL_TABLET | Freq: Two times a day (BID) | ORAL | Status: AC
  Administered 2021-09-25 – 2021-10-01 (×13): 10 mg via ORAL
  Filled 2021-09-25 (×14): qty 2

## 2021-09-25 MED ORDER — INSULIN ASPART 100 UNIT/ML IJ SOLN
0.0000 [IU] | Freq: Every day | INTRAMUSCULAR | Status: DC
  Administered 2021-09-30 – 2021-10-06 (×4): 2 [IU] via SUBCUTANEOUS
  Administered 2021-10-07: 22:00:00 3 [IU] via SUBCUTANEOUS
  Administered 2021-10-13: 23:00:00 2 [IU] via SUBCUTANEOUS
  Administered 2021-10-14: 23:00:00 5 [IU] via SUBCUTANEOUS
  Administered 2021-10-15 – 2021-10-16 (×2): 3 [IU] via SUBCUTANEOUS
  Administered 2021-10-17: 2 [IU] via SUBCUTANEOUS
  Administered 2021-10-18: 5 [IU] via SUBCUTANEOUS
  Administered 2021-10-19: 2 [IU] via SUBCUTANEOUS
  Administered 2021-10-20: 21:00:00 4 [IU] via SUBCUTANEOUS
  Filled 2021-09-25 (×15): qty 1

## 2021-09-25 MED ORDER — LOPERAMIDE HCL 2 MG PO CAPS
2.0000 mg | ORAL_CAPSULE | ORAL | Status: DC | PRN
  Administered 2021-09-25 – 2021-10-10 (×6): 2 mg via ORAL
  Filled 2021-09-25 (×5): qty 1

## 2021-09-25 NOTE — Progress Notes (Signed)
PROGRESS NOTE    Judith Shaw  EXN:[id number] DOB: [birth year] DOA: 09/23/2021 PCP: Pcp, No    Brief Narrative:    Judith Shaw is a 71 y.o. female with history of diabetes mellitus type 2, hyperlipidemia, depression, sleep apnea noncompliant with CPAP prior history of hysterectomy for uterine CA was brought to the ER from skilled nursing facility after patient had a brief episode of loss of consciousness. MRI brain did not show any acute changes. She has elevated D-dimer, CT angiogram chest showed bilateral PE.  She is started on IV heparin.  Assessment & Plan:   Principal Problem:   Acute encephalopathy Active Problems:   Diabetes (HCC)   Chronic diastolic CHF (congestive heart failure) (HCC)   Benign essential HTN   Dementia without behavioral disturbance (HCC)   Syncope   Lethargy   Pressure injury of skin   Bilateral pulmonary embolism (HCC)   Bilateral pulm emboli. Reviewed the patient CT angiogram results, patient has moderate burden of bilateral PE. Patient currently improving, not short of breath or hypoxemia.  Change anticoagulation to Eliquis.  Acute encephalopathy. Syncope. Generalized weakness. Frequent falls. Echocardiogram showed ejection fraction 55 to 49%, grade 1 diastolic dysfunction.  No evidence of valvular disease.  Type 2 diabetes. Continue current treatment.   Essential hypertension Continue home medicines.   DVT prophylaxis: Eliquis Code Status: Full Family Communication:  Disposition Plan: Discussed with Education officer, museum, plan to transfer to SNF tomorrow if possible.     Status is: Inpatient   Remains inpatient appropriate because: severity of disease, iv treatment       I/O last 3 completed shifts: In: 1123.9 [I.V.:1123.9] Out: 800 [Urine:800] No intake/output data recorded.     Consultants:  None  Procedures: None  Antimicrobials:None  Subjective: Patient doing much better today, she was able to sit in the chair for 5 hours  yesterday. Denies any short of breath or cough, no chest pain. No abdominal pain or nausea vomiting.  Objective: Vitals:   09/25/21 0029 09/25/21 0507 09/25/21 0853 09/25/21 1149  BP: (!) 160/87 122/64 126/67 122/70  Pulse: 86 68 69 66  Resp: 18 16 16 18   Temp: 98.1 F (36.7 C) 98 F (36.7 C) 98.4 F (36.9 C) 98.2 F (36.8 C)  TempSrc: Oral Oral  Oral  SpO2: 96% 98% 97% 98%  Weight:        Intake/Output Summary (Last 24 hours) at 09/25/2021 1216 Last data filed at 09/24/2021 1928 Gross per 24 hour  Intake 1123.93 ml  Output 550 ml  Net 573.93 ml   Filed Weights   09/24/21 0532  Weight: 98.1 kg    Examination:  General exam: Appears calm and comfortable  Respiratory system: Clear to auscultation. Respiratory effort normal. Cardiovascular system: S1 & S2 heard, RRR. No JVD, murmurs, rubs, gallops or clicks. No pedal edema. Gastrointestinal system: Abdomen is nondistended, soft and nontender. No organomegaly or masses felt. Normal bowel sounds heard. Central nervous system: Alert and oriented. No focal neurological deficits. Extremities: Symmetric 5 x 5 power. Skin: No rashes, lesions or ulcers Psychiatry: Judgement and insight appear normal. Mood & affect appropriate.     Data Reviewed: I have personally reviewed following labs and imaging studies  CBC: Recent Labs  Lab 09/23/21 1055 09/23/21 2029 09/24/21 0507 09/25/21 0621  WBC 8.1 7.6 7.9 9.4  NEUTROABS 5.3  --  4.9  --   HGB 16.3* 16.3* 15.5* 15.2*  HCT 51.2* 51.1* 47.8* 46.6*  MCV 90.5 90.8 89.5 87.9  PLT 238 227 229 794   Basic Metabolic Panel: Recent Labs  Lab 09/23/21 1055 09/23/21 2029 09/24/21 0507  NA 137  --  138  K 4.9  --  4.7  CL 101  --  102  CO2 27  --  26  GLUCOSE 258*  --  166*  BUN 31*  --  32*  CREATININE 0.98 1.08* 0.96  CALCIUM 9.6  --  9.5   GFR: Estimated Creatinine Clearance: 62.3 mL/min (by C-G formula based on SCr of 0.96 mg/dL). Liver Function Tests: Recent Labs   Lab 09/23/21 1055 09/24/21 0507  AST 27 27  ALT 24 22  ALKPHOS 113 103  BILITOT 0.5 0.4  PROT 7.4 6.8  ALBUMIN 3.1* 3.0*   Recent Labs  Lab 09/23/21 1055  LIPASE 33   Recent Labs  Lab 09/23/21 2029  AMMONIA 11   Coagulation Profile: Recent Labs  Lab 09/24/21 1417  INR 1.1   Cardiac Enzymes: Recent Labs  Lab 09/23/21 2029  CKTOTAL 242*   BNP (last 3 results) No results for input(s): PROBNP in the last 8760 hours. HbA1C: No results for input(s): HGBA1C in the last 72 hours. CBG: Recent Labs  Lab 09/24/21 2132 09/25/21 0025 09/25/21 0229 09/25/21 0541 09/25/21 0855  GLUCAP 184* 145* 130* 133* 133*   Lipid Profile: No results for input(s): CHOL, HDL, LDLCALC, TRIG, CHOLHDL, LDLDIRECT in the last 72 hours. Thyroid Function Tests: Recent Labs    09/23/21 1055  TSH 2.992  FREET4 1.09   Anemia Panel: No results for input(s): VITAMINB12, FOLATE, FERRITIN, TIBC, IRON, RETICCTPCT in the last 72 hours. Sepsis Labs: Recent Labs  Lab 09/23/21 1155  LATICACIDVEN 1.5    Recent Results (from the past 240 hour(s))  Resp Panel by RT-PCR (Flu A&B, Covid) Nasopharyngeal Swab     Status: None   Collection Time: 09/23/21 11:42 AM   Specimen: Nasopharyngeal Swab; Nasopharyngeal(NP) swabs in vial transport medium  Result Value Ref Range Status   SARS Coronavirus 2 by RT PCR NEGATIVE NEGATIVE Final    Comment: (NOTE) SARS-CoV-2 target nucleic acids are NOT DETECTED.  The SARS-CoV-2 RNA is generally detectable in upper respiratory specimens during the acute phase of infection. The lowest concentration of SARS-CoV-2 viral copies this assay can detect is 138 copies/mL. A negative result does not preclude SARS-Cov-2 infection and should not be used as the sole basis for treatment or other patient management decisions. A negative result may occur with  improper specimen collection/handling, submission of specimen other than nasopharyngeal swab, presence of viral  mutation(s) within the areas targeted by this assay, and inadequate number of viral copies(<138 copies/mL). A negative result must be combined with clinical observations, patient history, and epidemiological information. The expected result is Negative.  Fact Sheet for Patients:  EntrepreneurPulse.com.au  Fact Sheet for Healthcare Providers:  IncredibleEmployment.be  This test is no t yet approved or cleared by the [place] FDA and  has been authorized for detection and/or diagnosis of SARS-CoV-2 by FDA under an Emergency Use Authorization (EUA). This EUA will remain  in effect (meaning this test can be used) for the duration of the COVID-19 declaration under Section 564(b)(1) of the Act, 21 U.S.C.section 360bbb-3(b)(1), unless the authorization is terminated  or revoked sooner.       Influenza A by PCR NEGATIVE NEGATIVE Final   Influenza B by PCR NEGATIVE NEGATIVE Final    Comment: (NOTE) The Xpert Xpress SARS-CoV-2/FLU/RSV plus assay is intended as an aid in the diagnosis  of influenza from Nasopharyngeal swab specimens and should not be used as a sole basis for treatment. Nasal washings and aspirates are unacceptable for Xpert Xpress SARS-CoV-2/FLU/RSV testing.  Fact Sheet for Patients: EntrepreneurPulse.com.au  Fact Sheet for Healthcare Providers: IncredibleEmployment.be  This test is not yet approved or cleared by the [place] FDA and has been authorized for detection and/or diagnosis of SARS-CoV-2 by FDA under an Emergency Use Authorization (EUA). This EUA will remain in effect (meaning this test can be used) for the duration of the COVID-19 declaration under Section 564(b)(1) of the Act, 21 U.S.C. section 360bbb-3(b)(1), unless the authorization is terminated or revoked.  Performed at [facility], [address], [place], [place] [address]   Urine Culture     Status:  Abnormal (Preliminary result)   Collection Time: 09/23/21 11:51 PM   Specimen: Urine, Catheterized  Result Value Ref Range Status   Specimen Description   Final    URINE, CATHETERIZED Performed at [facility], [address], [place], [place] [address]    Special Requests   Final    NONE Performed at [facility], [address], [place], [place] [address]    Culture (A)  Final    >=100,000 COLONIES/mL KLEBSIELLA PNEUMONIAE SUSCEPTIBILITIES TO FOLLOW Performed at [facility], [place] [address] [place],  [address]    Report Status PENDING  Incomplete         Radiology Studies: CT Head Wo Contrast  Result Date: 09/23/2021 CLINICAL DATA:  Mental status change, unknown cause EXAM: CT HEAD WITHOUT CONTRAST TECHNIQUE: Contiguous axial images were obtained from the base of the skull through the vertex without intravenous contrast. RADIATION DOSE REDUCTION: This exam was performed according to the departmental dose-optimization program which includes automated exposure control, adjustment of the mA and/or kV according to patient size and/or use of iterative reconstruction technique. COMPARISON:  08/12/2021 FINDINGS: Brain: There is no acute intracranial hemorrhage, mass effect, or edema. Gray-white differentiation is preserved. There is no extra-axial fluid collection. Ventricles and sulci are stable in size and configuration. Minimal patchy low-density in the supratentorial white matter is nonspecific but probably reflects stable minor chronic microvascular ischemic changes. Vascular: There is atherosclerotic calcification at the skull base. Skull: Calvarium is unremarkable. Sinuses/Orbits: No acute finding. Other: None. IMPRESSION: No acute intracranial abnormality. Electronically Signed   By: Macy Mis M.D.   On: 09/23/2021 12:45   CT Angio Chest Pulmonary Embolism (PE) W or WO Contrast  Result Date: 09/24/2021 CLINICAL DATA:  Positive D-dimer.   Suspected pulmonary embolism. EXAM: CT ANGIOGRAPHY CHEST WITH CONTRAST TECHNIQUE: Multidetector CT imaging of the chest was performed using the standard protocol during bolus administration of intravenous contrast. Multiplanar CT image reconstructions and MIPs were obtained to evaluate the vascular anatomy. RADIATION DOSE REDUCTION: This exam was performed according to the departmental dose-optimization program which includes automated exposure control, adjustment of the mA and/or kV according to patient size and/or use of iterative reconstruction technique. CONTRAST:  107mL OMNIPAQUE IOHEXOL 350 MG/ML SOLN COMPARISON:  CTA chest studies 08/13/2021 and 07/27/2021 FINDINGS: Cardiovascular: There is mild cardiomegaly. The pulmonary arteries are normal in caliber. There is no saddle embolus, but there is acute embolic disease with partially occlusive embolus in the distal right main pulmonary artery, occluding embolus in the interlobar pulmonary artery and partially occluding embolus in the medial and lateral segmental main arteries to the right middle lobe. In the right lower lobe there is additional non occluding embolus in the main  artery, in at least 3 segmental divisions and in several downstream posterior basal subsegmental arteries. There are additional at least 1 segmental and 2 subsegmental arteries containing thrombus in the right upper lobe, segmental thrombus in the left upper lobe superior lingular and a few subsegmental lingular segments, and left lower lobe nonocclusive thrombus in the main artery, 3 segmental and several downstream posterior basal subsegmental arteries. There is an overall moderate clot burden but no appreciable right heart strain findings. There is no aortic aneurysm, significant atherosclerosis or dissection , no great vessel stenosis. The pulmonary veins are normal. Mediastinum/Nodes: No enlarged mediastinal, hilar, or axillary lymph nodes. Thyroid gland, trachea, and esophagus  demonstrate no significant findings. Lungs/Pleura: Posterior basal atelectasis is similar to prior studies. Small peripheral ground-glass opacities laterally in the base of the right upper lobe are also unchanged. Probable scarring. The lungs are otherwise clear. There is no pleural effusion, thickening or pneumothorax. The trachea and central airways are clear. Upper Abdomen: No acute abnormality. Musculoskeletal: No chest wall abnormality. No acute or significant osseous findings. Review of the MIP images confirms the above findings. IMPRESSION: 1. Bilateral pulmonary arterial emboli with overall moderate clot burden, but no arterial dilatation or acute right heart strain findings. 2. Mild cardiomegaly, but the heart is smaller than previously. 3. Stable appearance of lower lobe atelectasis. No focal infiltrate is seen. 4. Results phoned to nurse practitioner Sharion Settler at 5:19 a.m., 09/24/2021. Electronically Signed   By: Telford Nab M.D.   On: 09/24/2021 05:19   MR BRAIN WO CONTRAST  Result Date: 09/23/2021 CLINICAL DATA:  Mental status change, unknown cause EXAM: MRI HEAD WITHOUT CONTRAST TECHNIQUE: Multiplanar, multiecho pulse sequences of the brain and surrounding structures were obtained without intravenous contrast. COMPARISON:  None. FINDINGS: Brain: There is no acute infarction or intracranial hemorrhage. There is no intracranial mass, mass effect, or edema. There is no hydrocephalus or extra-axial fluid collection. Ventricles and sulci are within normal limits in size and configuration. Patchy foci of T2 hyperintensity in the supratentorial white matter are nonspecific but may reflect minimal chronic microvascular ischemic changes. Vascular: Major vessel flow voids at the skull base are preserved. Skull and upper cervical spine: Normal marrow signal is preserved. Sinuses/Orbits: Paranasal sinuses are aerated. Orbits are unremarkable. Other: Sella is unremarkable.  Mastoid air cells are clear.  IMPRESSION: No evidence of recent infarction, hemorrhage, or mass. Electronically Signed   By: Macy Mis M.D.   On: 09/23/2021 14:59   EEG adult  Result Date: 09/24/2021 Lora Havens, MD     09/24/2021  9:59 PM Patient Name: ZAKHIA SERES MRN: [id number] Epilepsy Attending: Lora Havens Referring Physician/Provider: Rise Patience, MD Date: 09/24/2021 Duration: 20.17 mins Patient history: 71yo F with ams and syncope. EEG to evaluate for seizure Level of alertness: Awake AEDs during EEG study: None Technical aspects: This EEG study was done with scalp electrodes positioned according to the 10-20 International system of electrode placement. Electrical activity was acquired at a sampling rate of 500Hz  and reviewed with a high frequency filter of 70Hz  and a low frequency filter of 1Hz . EEG data were recorded continuously and digitally stored. Description: The posterior dominant rhythm consists of 7Hz  activity of moderate voltage (25-35 uV) seen predominantly in posterior head regions, symmetric and reactive to eye opening and eye closing. EEG showed continuous generalized 5 to 7 Hz theta slowing. Physiologic photic driving was not seen during photic stimulation.  Hyperventilation was not performed.   ABNORMALITY - Continuous  slow, generalized - Background slow IMPRESSION: This study is suggestive of mild to moderate diffuse encephalopathy, nonspecific etiology. No seizures or epileptiform discharges were seen throughout the recording. Lora Havens   ECHOCARDIOGRAM COMPLETE  Result Date: 09/24/2021    ECHOCARDIOGRAM REPORT   Patient Name:   MASSIE COGLIANO Date of Exam: 09/24/2021 Medical Rec #:  [id number]   Height:       65.0 in Accession #:    [id number]  Weight:       216.3 lb Date of Birth:  May 06, 1951    BSA:          2.045 m Patient Age:    44 years    BP:           149/83 mmHg Patient Gender: F           HR:           76 bpm. Exam Location:  [facility] Procedure: 2D Echo Indications:     Syncope R55   History:         Patient has no prior history of Echocardiogram examinations.  Sonographer:     Kathlen Brunswick RDCS Referring Phys:  [place] Diagnosing Phys: Nelva Bush MD  Sonographer Comments: Technically difficult study due to poor echo windows. IMPRESSIONS  1. Left ventricular ejection fraction, by estimation, is 55 to 60%. The left ventricle has normal function. The left ventricle demonstrates regional wall motion abnormalities (see scoring diagram/findings for description). There is mild left ventricular  hypertrophy. Left ventricular diastolic parameters are consistent with Grade I diastolic dysfunction (impaired relaxation). There is of the left ventricular,.  2. Right ventricular systolic function is normal. The right ventricular size is normal.  3. The mitral valve is grossly normal. Trivial mitral valve regurgitation. No evidence of mitral stenosis.  4. The aortic valve was not well visualized. Aortic valve regurgitation is not visualized. No aortic stenosis is present. FINDINGS  Left Ventricle: Left ventricular ejection fraction, by estimation, is 55 to 60%. The left ventricle has normal function. The left ventricle demonstrates regional wall motion abnormalities. The left ventricular internal cavity size was normal in size. There is mild left ventricular hypertrophy. Left ventricular diastolic parameters are consistent with Grade I diastolic dysfunction (impaired relaxation).  LV Wall Scoring: The basal inferolateral segment and basal inferior segment are dyskinetic. The entire anterior wall, antero-lateral wall, mid and distal lateral wall, entire septum, entire apex, and mid and distal inferior wall are normal. Right Ventricle: The right ventricular size is normal. Right vetricular wall thickness was not well visualized. Right ventricular systolic function is normal. Left Atrium: Left atrial size was normal in size. Right Atrium: Right atrial size was normal in size.  Pericardium: The pericardium was not well visualized. Mitral Valve: The mitral valve is grossly normal. Trivial mitral valve regurgitation. No evidence of mitral valve stenosis. Tricuspid Valve: The tricuspid valve is not well visualized. Tricuspid valve regurgitation is trivial. Aortic Valve: The aortic valve was not well visualized. Aortic valve regurgitation is not visualized. No aortic stenosis is present. Aortic valve peak gradient measures 8.4 mmHg. Pulmonic Valve: The pulmonic valve was not well visualized. Aorta: The aortic root is normal in size and structure. Venous: The inferior vena cava was not well visualized. IAS/Shunts: The interatrial septum was not well visualized.  LEFT VENTRICLE PLAX 2D LVIDd:         4.30 cm     Diastology LVIDs:         3.00  cm     LV e' medial:    5.44 cm/s LV PW:         1.20 cm     LV E/e' medial:  14.0 LV IVS:        1.20 cm     LV e' lateral:   4.57 cm/s                            LV E/e' lateral: 16.7  LV Volumes (MOD) LV vol d, MOD A2C: 48.4 ml LV vol d, MOD A4C: 67.7 ml LV vol s, MOD A2C: 17.5 ml LV vol s, MOD A4C: 20.6 ml LV SV MOD A2C:     30.9 ml LV SV MOD A4C:     67.7 ml LV SV MOD BP:      38.7 ml RIGHT VENTRICLE RV Basal diam:  2.90 cm RV S prime:     15.90 cm/s TAPSE (M-mode): 1.9 cm LEFT ATRIUM             Index        RIGHT ATRIUM          Index LA Vol (A2C):   15.8 ml 7.73 ml/m   RA Area:     9.92 cm LA Vol (A4C):   23.3 ml 11.39 ml/m  RA Volume:   19.10 ml 9.34 ml/m LA Biplane Vol: 20.2 ml 9.88 ml/m  AORTIC VALVE AV Vmax:      145.00 cm/s AV Peak Grad: 8.4 mmHg LVOT Vmax:    106.00 cm/s LVOT Vmean:   74.000 cm/s LVOT VTI:     0.208 m  AORTA Ao Root diam: 2.80 cm MITRAL VALVE MV Area (PHT): 2.90 cm    SHUNTS MV Decel Time: 262 msec    Systemic VTI: 0.21 m MV E velocity: 76.30 cm/s MV A velocity: 98.50 cm/s MV E/A ratio:  0.77 Christopher End MD Electronically signed by Nelva Bush MD Signature Date/Time: 09/24/2021/3:27:17 PM    Final          Scheduled Meds:  apixaban  10 mg Oral BID   Followed by   Derrill Memo ON 10/02/2021] apixaban  5 mg Oral BID   insulin aspart  0-5 Units Subcutaneous QHS   insulin aspart  0-9 Units Subcutaneous TID WC   insulin glargine-yfgn  15 Units Subcutaneous QHS   Continuous Infusions:   LOS: 1 day    Time spent: 27 minutes    Sharen Hones, MD [facility]   To contact the attending provider between 7A-7P or the covering provider during after hours 7P-7A, please log into the web site [email or web address] and access using universal [employer] password for that web site. If you do not have the password, please call the hospital operator.  09/25/2021, 12:16 PM

## 2021-09-25 NOTE — Progress Notes (Signed)
SLP Cancellation Note  Patient Details Name: Judith Shaw MRN: [id number] DOB: 1951-08-21   Cancelled treatment:       Reason Eval/Treat Not Completed: Patient unavailable (pt participating in patient care needs with nursing.)  Cherrie Gauze, M.S., [facility] [phone] [place])   Quintella Baton 09/25/2021, 9:27 AM

## 2021-09-25 NOTE — Evaluation (Signed)
Clinical/Bedside Swallow Evaluation Patient Details  Name: Judith Shaw MRN: [id number] Date of Birth: 05/01/1951  Today's Date: 09/25/2021 Time: SLP Start Time (ACUTE ONLY): 0900 SLP Stop Time (ACUTE ONLY): 0915 SLP Time Calculation (min) (ACUTE ONLY): 15 min  Past Medical History:  Past Medical History:  Diagnosis Date   Anxiety    Cancer ([place])    pt states hx of uterine cancer and had a complete hyst   CHF (congestive heart failure) ([place])    Depression    Diabetes mellitus without complication ([place])    Hyperlipidemia    Hypertension    Past Surgical History:  Past Surgical History:  Procedure Laterality Date   ABDOMINAL HYSTERECTOMY     AMPUTATION Left 12/19/2019   Procedure: AMPUTATION RAY Left 5th;  Surgeon: Caroline More, DPM;  Location: [facility] ORS;  Service: Podiatry;  Laterality: Left;   CHOLECYSTECTOMY     LOWER EXTREMITY ANGIOGRAPHY Left 12/17/2019   Procedure: Lower Extremity Angiography;  Surgeon: Algernon Huxley, MD;  Location: [place] [facility];  Service: Cardiovascular;  Laterality: Left;   HPI:  Per Physician's H&P "Judith Shaw is a 71 y.o. female with history of diabetes mellitus type 2, hyperlipidemia, depression, sleep apnea noncompliant with CPAP prior history of hysterectomy for uterine CA was brought to the ER from skilled nursing facility after patient had a brief episode of loss of consciousness.  As per the patient's sister patient has become more lethargic over the last 5 days.  Patient has been eating less.  Has been more bedbound and physical therapy has been trying to make her ambulate but finds it difficult.  2 days ago with some of her depression medications were changed her BuSpar dose was increased.  Patient has become more lethargic.  This morning patient was attempted to stand up when patient lost consciousness briefly and fell onto back to her bed.  Since then patient has become more lethargic.  No seizure-like activity was noted.  Patient has not  complained of any chest pain shortness of breath nausea vomiting or diarrhea.     ED Course: In the ER patient was found to be very sleepy MRI brain did not show anything acute.  Patient is following commands moving all extremities when patient woke up.  Labs are unremarkable.  Patient is afebrile.  ABG does not show any carbon dioxide retention.  Lactic acid is normal I sensitive troponins are negative.  COVID test negative.  Patient admitted for further observation.     Review of Systems: As per HPI, rest all negative."    Assessment / Plan / Recommendation  Clinical Impression  Pt seen for clinical swallowing evaluation. Pt demonstrated an intact oral swallow. Pharyngeal swallow appeared [facility] per clinical assessment. Pt denies dysphagia. Pt benefited from physical assistance for repositioning and set-up assistance with feeding. Noted tremulous BUE during feeding.   Noted pt known to SLP services from admission in December 2022 with recommendation for mech soft diet with thin liquids at that time.   Recommend continuation of current diet with standard aspiration precautions and set-up assistance with meals.   SLP to sign off at this time as pt has no acute SLP needs.   Pt and RN made aware of diet recommendations, safe swallowing strategies/aspiration precautions, and SLP POC. Pt verbalized understanding; however, pt may benefit from reinforcement of content by medical team given AMS.  SLP Visit Diagnosis: Dysphagia, unspecified (R13.10)    Aspiration Risk  Mild aspiration risk  Diet Recommendation Regular;Thin liquid   Medication Administration:  (as tolerated) Supervision: Patient able to self feed (set-up assistance) Compensations: Minimize environmental distractions;Slow rate;Small sips/bites Postural Changes: Seated upright at 90 degrees;Remain upright for at least 30 minutes after po intake    Other  Recommendations Oral Care Recommendations: Oral care BID;Oral care before and  after PO    Recommendations for follow up therapy are one component of a multi-disciplinary discharge planning process, led by the attending physician.  Recommendations may be updated based on patient status, additional functional criteria and insurance authorization.  Follow up Recommendations No SLP follow up      Assistance Recommended at Discharge Frequent or constant Supervision/Assistance  Functional Status Assessment Patient has not had a recent decline in their functional status    Swallow Study   General Date of Onset: 09/23/21 HPI: Per 4 H&P "Judith Shaw is a 71 y.o. female with history of diabetes mellitus type 2, hyperlipidemia, depression, sleep apnea noncompliant with CPAP prior history of hysterectomy for uterine CA was brought to the ER from skilled nursing facility after patient had a brief episode of loss of consciousness.  As per the patient's sister patient has become more lethargic over the last 5 days.  Patient has been eating less.  Has been more bedbound and physical therapy has been trying to make her ambulate but finds it difficult.  2 days ago with some of her depression medications were changed her BuSpar dose was increased.  Patient has become more lethargic.  This morning patient was attempted to stand up when patient lost consciousness briefly and fell onto back to her bed.  Since then patient has become more lethargic.  No seizure-like activity was noted.  Patient has not complained of any chest pain shortness of breath nausea vomiting or diarrhea.     ED Course: In the ER patient was found to be very sleepy MRI brain did not show anything acute.  Patient is following commands moving all extremities when patient woke up.  Labs are unremarkable.  Patient is afebrile.  ABG does not show any carbon dioxide retention.  Lactic acid is normal I sensitive troponins are negative.  COVID test negative.  Patient admitted for further observation.     Review of Systems: As  per HPI, rest all negative." Type of Study: Bedside Swallow Evaluation Previous Swallow Assessment: known to SLP services from previous admission in December 2022 - recommended mech soft/thin Diet Prior to this Study: Regular;Thin liquids Temperature Spikes Noted: No Respiratory Status: Room air History of Recent Intubation: No Behavior/Cognition: Alert;Cooperative;Confused;Requires cueing Oral Cavity Assessment: Within Functional Limits Oral Care Completed by SLP: Yes Oral Cavity - Dentition: Adequate natural dentition Vision: Functional for self-feeding Self-Feeding Abilities: Able to feed self;Needs set up Patient Positioning: Upright in bed Baseline Vocal Quality: Normal Volitional Cough: Strong Volitional Swallow: Able to elicit    Oral/Motor/Sensory Function Overall Oral Motor/Sensory Function: Within functional limits   Thin Liquid Thin Liquid: Within functional limits Presentation: Cup;Straw Other Comments: ~4 oz total    Puree Puree: Within functional limits Presentation: Self Fed Other Comments: ~3 oz total   Solid     Solid: Within functional limits Presentation: Spoon Other Comments: x3 spoonfuls of scrambled eggs     Cherrie Gauze, M.S., [facility] [phone] (ASCOM)  Clearnce Sorrel Liara Holm 09/25/2021,9:36 AM

## 2021-09-25 NOTE — Evaluation (Signed)
Physical Therapy Evaluation Patient Details Name: Judith Shaw MRN: [id number] DOB: 1951-03-10 Today's Date: 09/25/2021  History of Present Illness  Pt is a 71 y/o F admitted on 09/23/21 who presented to the ED from SNF after pt had a brief episode of LOC. MRI was negative for any acute changes. Pt with elevated d-dimer, CT angiogram chest showed bilateral PE. Pt was started on IV heparin. PMH: DM2, HLD, depression, sleep apnea noncompliant with CPAP, hysterectomy for uterine CA. MD cleared pt for participation in therapy prior to completing 48 hrs of anticoagulation.  Clinical Impression  Pt seen for PT evaluation with pt found incontinent of BM & pt unaware. Pt oriented to self & time only during session. Pt easily irritated during session when PT attempts to facilitate knee flexion to assist with rolling & when NT attempts to perform finger stick. Pt requires +2 assist for rolling L<>R with pt assisting only minimally. Pt would benefit from STR upon d/c to maximize independence & to decrease caregiver burden. Pt on room air with SPO2 >90% throughout session.       Recommendations for follow up therapy are one component of a multi-disciplinary discharge planning process, led by the attending physician.  Recommendations may be updated based on patient status, additional functional criteria and insurance authorization.  Follow Up Recommendations Skilled nursing-short term rehab (<3 hours/day)    Assistance Recommended at Discharge Frequent or constant Supervision/Assistance  Patient can return home with the following  Two people to help with walking and/or transfers;Two people to help with bathing/dressing/bathroom;Direct supervision/assist for medications management;Help with stairs or ramp for entrance;Assist for transportation;Direct supervision/assist for financial management;Assistance with cooking/housework    Equipment Recommendations None recommended by PT  Recommendations for Other  Services       Functional Status Assessment  (unsure, no caregiver present to determine baseline)     Precautions / Restrictions Precautions Precautions: Fall Restrictions Weight Bearing Restrictions: No      Mobility  Bed Mobility Overal bed mobility: Needs Assistance Bed Mobility: Rolling Rolling: Mod assist, Max assist, +2 for physical assistance              Transfers                        Ambulation/Gait                  Stairs            Wheelchair Mobility    Modified Rankin (Stroke Patients Only)       Balance                                             Pertinent Vitals/Pain Pain Assessment Pain Assessment: Faces Faces Pain Scale: Hurts a little bit Pain Location: when PT touches LE knees Pain Descriptors / Indicators: Discomfort Pain Intervention(s): Repositioned    Home Living Family/patient expects to be discharged to:: Skilled nursing facility                        Prior Function Prior Level of Function : Needs assist             Mobility Comments: Pt unable to report but per last chart report: Pt has had a personal care aide for the last 1 year, but the past 3 months  their care increased to 24 hrs/day. Pt would sleep sleep until 2pm & would stay awake until 3-4pm but recently has started sleeping more hours during the day, only waking up 30 min-2hours at a time then quickly returning to deep sleep. Pt has required more assistance & required help for gait, bathing, dressing, & feeding. Pt has been having more issues 2/2 neuropathy.       Hand Dominance        Extremity/Trunk Assessment   Upper Extremity Assessment Upper Extremity Assessment: Generalized weakness    Lower Extremity Assessment Lower Extremity Assessment: Generalized weakness       Communication   Communication: No difficulties  Cognition Arousal/Alertness: Awake/alert Behavior During Therapy:   (irritiable) Overall Cognitive Status: Difficult to assess                                 General Comments: Hx of dementia, no caregiver to determine baseline. Pt initially aware she's at the hospital but then states she's at home. AxO to self, year & month.        General Comments      Exercises     Assessment/Plan    PT Assessment Patient needs continued PT services  PT Problem List Decreased strength;Decreased mobility;Decreased safety awareness;Obesity;Decreased coordination;Decreased activity tolerance;Decreased range of motion;Decreased cognition;Cardiopulmonary status limiting activity;Pain;Decreased balance;Decreased knowledge of use of DME       PT Treatment Interventions Therapeutic exercise;DME instruction;Gait training;Balance training;Stair training;Neuromuscular re-education;Functional mobility training;Cognitive remediation;Therapeutic activities;Patient/family education;Modalities;Manual techniques    PT Goals (Current goals can be found in the Care Plan section)  Acute Rehab PT Goals Patient Stated Goal: none stated PT Goal Formulation: With patient Time For Goal Achievement: 10/09/21 Potential to Achieve Goals: Fair    Frequency Min 2X/week     Co-evaluation               AM-PAC PT "6 Clicks" Mobility  Outcome Measure Help needed turning from your back to your side while in a flat bed without using bedrails?: Total Help needed moving from lying on your back to sitting on the side of a flat bed without using bedrails?: Total Help needed moving to and from a bed to a chair (including a wheelchair)?: Total Help needed standing up from a chair using your arms (e.g., wheelchair or bedside chair)?: Total Help needed to walk in hospital room?: Total Help needed climbing 3-5 steps with a railing? : Total 6 Click Score: 6    End of Session   Activity Tolerance: Patient limited by pain Patient left: in bed;with call bell/phone within  reach;with bed alarm set Nurse Communication: Mobility status PT Visit Diagnosis: Unsteadiness on feet (R26.81);Muscle weakness (generalized) (M62.81);Difficulty in walking, not elsewhere classified (R26.2)    Time: 1210-1224 PT Time Calculation (min) (ACUTE ONLY): 14 min   Charges:   PT Evaluation $PT Eval Moderate Complexity: [place], PT, DPT 09/25/21, 1:16 PM   Waunita Schooner 09/25/2021, 1:13 PM

## 2021-09-25 NOTE — Progress Notes (Signed)
Patient yelling out for assistance. Patient is able to use call light appropriately, but will starting yelling out when someone does not come to her room right away. Patient educated on  inappropriate vs appropriate behavior. Patient responded by requesting to have someone in her room at all times. Patient made aware this was not possible. Patient encouraged to use call light for any needs and made aware staff would be rounding hourly.

## 2021-09-25 NOTE — Progress Notes (Signed)
Patient continues to yell out and be disruptive despite multiple attempts at education and re-education. Patient able to use call light appropriately.

## 2021-09-25 NOTE — Progress Notes (Signed)
ANTICOAGULATION CONSULT NOTE   Pharmacy Consult for heparin infusion Indication: pulmonary embolus  Allergies  Allergen Reactions   Codeine     Patient Measurements: Weight: 98.1 kg (216 lb 4.3 oz) Heparin Dosing Weight: 79.3 kg  Vital Signs: Temp: 98 F (36.7 C) (01/29 0507) Temp Source: Oral (01/29 0507) BP: 122/64 (01/29 0507) Pulse Rate: 68 (01/29 0507)  Labs: Recent Labs    09/23/21 1055 09/23/21 1405 09/23/21 2029 09/24/21 0507 09/24/21 1417 09/24/21 2308 09/25/21 0621  HGB 16.3*  --  16.3* 15.5*  --   --  15.2*  HCT 51.2*  --  51.1* 47.8*  --   --  46.6*  PLT 238  --  227 229  --   --  242  APTT  --   --   --   --  70*  --   --   LABPROT  --   --   --   --  14.2  --   --   INR  --   --   --   --  1.1  --   --   HEPARINUNFRC  --   --   --   --  0.86* 0.69 0.53  CREATININE 0.98  --  1.08* 0.96  --   --   --   CKTOTAL  --   --  242*  --   --   --   --   TROPONINIHS 9 8  --   --   --   --   --      Estimated Creatinine Clearance: 62.3 mL/min (by C-G formula based on SCr of 0.96 mg/dL).   Medical History: Past Medical History:  Diagnosis Date   Anxiety    Cancer ([place])    pt states hx of uterine cancer and had a complete hyst   CHF (congestive heart failure) ([place])    Depression    Diabetes mellitus without complication ([place])    Hyperlipidemia    Hypertension     Medications:  Pt given Lovenox 40 mg x 1 dose 1/27 @ 2206  Assessment: Pt is 71 yo female presenting to ED due to lethargy & loss of consciousness, being started on heparin pending CTA chest to rule out PE.  1/28 1417 HL 0.86 1/28 2308 HL 0.69, therapeutic x 1 1/29 0621 HL 0.53, therapeutic x 2  Goal of Therapy:  Heparin level 0.3-0.7 units/ml Monitor platelets by anticoagulation protocol: Yes   Plan:  Heparin level is therapeutic. Will continue the heparin infusion at 1150 units/hr. Recheck heparin level w/ AM labs to confirm. CBC daily while on heparin.   Renda Rolls, PharmD,  [facility] 09/25/2021 6:58 AM

## 2021-09-25 NOTE — Progress Notes (Signed)
Patient being visited by adoptive daughter and grandson. Updated at bedside with patient permission. Both are very concerned with her refusal to participate in therapy and willingness to "give up." They endorse having visited her on multiple occassions at [facility] where they encountered the same lack of motivation. They state patient expressed interest in "just being comfortable" around Thanksgiving. Today patient states her "soul is ready to die" but she "wants to stay here." When attempts are made to discuss with patient steps she needs to take in order to make progress in improving her health, patient becomes irritable and asks to "talk about something else"

## 2021-09-25 NOTE — Progress Notes (Signed)
ANTICOAGULATION CONSULT NOTE   Pharmacy Consult for Heparin Infusion Indication: pulmonary embolus  Patient Measurements: Weight: 98.1 kg (216 lb 4.3 oz)  Labs: Recent Labs    09/23/21 1055 09/23/21 1405 09/23/21 2029 09/24/21 0507 09/24/21 1417 09/24/21 2308 09/25/21 0621  HGB 16.3*  --  16.3* 15.5*  --   --  15.2*  HCT 51.2*  --  51.1* 47.8*  --   --  46.6*  PLT 238  --  227 229  --   --  242  APTT  --   --   --   --  70*  --   --   LABPROT  --   --   --   --  14.2  --   --   INR  --   --   --   --  1.1  --   --   HEPARINUNFRC  --   --   --   --  0.86* 0.69 0.53  CREATININE 0.98  --  1.08* 0.96  --   --   --   CKTOTAL  --   --  242*  --   --   --   --   TROPONINIHS 9 8  --   --   --   --   --      Estimated Creatinine Clearance: 62.3 mL/min (by C-G formula based on SCr of 0.96 mg/dL).  Medical History: Past Medical History:  Diagnosis Date   Anxiety    Cancer ([place])    pt states hx of uterine cancer and had a complete hyst   CHF (congestive heart failure) ([place])    Depression    Diabetes mellitus without complication ([place])    Hyperlipidemia    Hypertension     Assessment: Pt is 71 yo female presenting to ED due to lethargy & loss of consciousness. Found to have bilateral pulmonary emboli. Patient initially started on IV heparin and now transitioning to apixaban. Pharmacy consulted to assist with dosing.  Plan:  --Stop IV heparin --Start apixaban 10 mg BID x 7 days followed by apixaban 5 mg BID for remaining duration of therapy --CBC at least every 3 days per protocol  Benita Gutter  09/25/2021 10:53 AM

## 2021-09-26 DIAGNOSIS — I2699 Other pulmonary embolism without acute cor pulmonale: Secondary | ICD-10-CM | POA: Diagnosis not present

## 2021-09-26 DIAGNOSIS — I5032 Chronic diastolic (congestive) heart failure: Secondary | ICD-10-CM | POA: Diagnosis not present

## 2021-09-26 DIAGNOSIS — G9340 Encephalopathy, unspecified: Secondary | ICD-10-CM | POA: Diagnosis not present

## 2021-09-26 LAB — CBC
HCT: 48.5 % — ABNORMAL HIGH (ref 36.0–46.0)
Hemoglobin: 15.6 g/dL — ABNORMAL HIGH (ref 12.0–15.0)
MCH: 28.7 pg (ref 26.0–34.0)
MCHC: 32.2 g/dL (ref 30.0–36.0)
MCV: 89.3 fL (ref 80.0–100.0)
Platelets: 212 10*3/uL (ref 150–400)
RBC: 5.43 MIL/uL — ABNORMAL HIGH (ref 3.87–5.11)
RDW: 13.7 % (ref 11.5–15.5)
WBC: 8 10*3/uL (ref 4.0–10.5)
nRBC: 0 % (ref 0.0–0.2)

## 2021-09-26 LAB — URINE CULTURE: Culture: >=100000 — AB

## 2021-09-26 LAB — GLUCOSE, CAPILLARY
Glucose-Capillary: 173 mg/dL — ABNORMAL HIGH (ref 70–99)
Glucose-Capillary: 165 mg/dL — ABNORMAL HIGH (ref 70–99)
Glucose-Capillary: 167 mg/dL — ABNORMAL HIGH (ref 70–99)
Glucose-Capillary: 170 mg/dL — ABNORMAL HIGH (ref 70–99)

## 2021-09-26 MED ORDER — GABAPENTIN 300 MG PO CAPS
300.0000 mg | ORAL_CAPSULE | Freq: Every day | ORAL | Status: DC
  Administered 2021-09-26 – 2021-10-20 (×25): 300 mg via ORAL
  Filled 2021-09-26 (×25): qty 1

## 2021-09-26 MED ORDER — BUSPIRONE HCL 10 MG PO TABS
10.0000 mg | ORAL_TABLET | Freq: Three times a day (TID) | ORAL | Status: DC
  Administered 2021-09-26 – 2021-10-21 (×72): 10 mg via ORAL
  Filled 2021-09-26 (×75): qty 1

## 2021-09-26 MED ORDER — KETOROLAC TROMETHAMINE 15 MG/ML IJ SOLN
15.0000 mg | Freq: Once | INTRAMUSCULAR | Status: AC
  Administered 2021-09-26: 22:00:00 15 mg via INTRAVENOUS
  Filled 2021-09-26: qty 1

## 2021-09-26 MED ORDER — DONEPEZIL HCL 5 MG PO TABS
10.0000 mg | ORAL_TABLET | Freq: Every day | ORAL | Status: DC
  Administered 2021-09-26 – 2021-10-20 (×25): 10 mg via ORAL
  Filled 2021-09-26 (×25): qty 2

## 2021-09-26 MED ORDER — LORAZEPAM 0.5 MG PO TABS
0.5000 mg | ORAL_TABLET | Freq: Three times a day (TID) | ORAL | Status: DC
  Administered 2021-09-26 – 2021-10-06 (×28): 0.5 mg via ORAL
  Filled 2021-09-26 (×30): qty 1

## 2021-09-26 MED ORDER — ARIPIPRAZOLE 5 MG PO TABS
5.0000 mg | ORAL_TABLET | Freq: Every day | ORAL | Status: DC
  Administered 2021-09-26 – 2021-10-21 (×25): 5 mg via ORAL
  Filled 2021-09-26 (×26): qty 1

## 2021-09-26 NOTE — Evaluation (Signed)
Occupational Therapy Evaluation Patient Details Name: Judith Shaw MRN: [id number] DOB: [birth year] Today's Date: 09/26/2021   History of Present Illness Pt is a 71 y/o F admitted on 09/23/21 who presented to the ED from SNF after pt had a brief episode of LOC. MRI was negative for any acute changes. Pt with elevated d-dimer, CT angiogram chest showed bilateral PE. Pt was started on IV heparin. PMH: DM2, HLD, depression, sleep apnea noncompliant with CPAP, hysterectomy for uterine CA. MD cleared pt for participation in therapy prior to completing 48 hrs of anticoagulation.   Clinical Impression   Pt seen for OT evaluation this date. Functional mobility portion of session coordinated with PT address pt's cognition/behavior during functional activity and to maximize patient/therapist safety. Upon arrival to room, pt awake and seated upright in bed. Pt A&Ox2 (stated she was at [facility], was disoriented to situation). Pt with hx of dementia and stating that she is unable to recall PLOF. PLOF below obtained from recent hospital admission (plan to confirm with family/caregivers as able). While in chair position in bed, pt required MOD A to comb hair d/t decreased UE strength/AROM and SET-UP assist to wash face with washcloth. Pt required MAX A+2 for supine>sit transfer and MAX A for seated LB dressing. Pt tolerated sitting EOB for 10 mins while engaging in seated therapy exercises (see PT note), requiring MIN GUARD-MIN A for dynamic balance. At end of session, pt required TOTAL A+2 for lateral scoot toward HOB and MOD A+2 for sit>supine transfer. Of note, following sit>supine transfer, pt with nausea (+emesis) and nystagmus; RN informed. Pt currently presents with decreased strength, balance, activity tolerance, and awareness of deficits, and would benefit from additional skilled OT services to maximize return to PLOF and minimize risk of future falls, injury, caregiver burden, and readmission. Upon discharge,  recommend SNF.       Recommendations for follow up therapy are one component of a multi-disciplinary discharge planning process, led by the attending physician.  Recommendations may be updated based on patient status, additional functional criteria and insurance authorization.   Follow Up Recommendations  Skilled nursing-short term rehab (<3 hours/day)    Assistance Recommended at Discharge Frequent or constant Supervision/Assistance  Patient can return home with the following Two people to help with walking and/or transfers;Two people to help with bathing/dressing/bathroom    Functional Status Assessment  Patient has had a recent decline in their functional status and/or demonstrates limited ability to make significant improvements in function in a reasonable and predictable amount of time  Equipment Recommendations  Other (comment) (defer to next venue of care)       Precautions / Restrictions Precautions Precautions: Fall Restrictions Weight Bearing Restrictions: No      Mobility Bed Mobility Overal bed mobility: Needs Assistance Bed Mobility: Supine to Sit, Sit to Supine     Supine to sit: Max assist, +2 for physical assistance, HOB elevated Sit to supine: Mod assist, +2 for physical assistance   General bed mobility comments: Pt reported nausea (+emesis) and was noted to have nystagmus following sit>supine transfer    Transfers Overall transfer level: Needs assistance                Lateral/Scoot Transfers: Total assist, +2 physical assistance General transfer comment: Requries TOTAL A+2 to lateral scoot toward HOB. Pt refused to attempt sit>stand transfer attempts      Balance Overall balance assessment: Needs assistance Sitting-balance support: Bilateral upper extremity supported, Feet supported Sitting balance-Leahy Scale: [place] Sitting  balance - Comments: With b/l UE supported, pt maintained static sitting balance at EOB, requiring only SUPERVISION. With  single UE supported, pt required MIN GUARD-MIN A d/t intermittent posterior lean Postural control: Posterior lean                                 ADL either performed or assessed with clinical judgement   ADL Overall ADL's : Needs assistance/impaired     Grooming: Brushing hair;Moderate assistance;Wash/dry face;Set up;Bed level Grooming Details (indicate cue type and reason): Pt able to comb ~60% of head; requires assist to comb back of head.             Lower Body Dressing: Maximal assistance;Sitting/lateral leans Lower Body Dressing Details (indicate cue type and reason): to don socks while seated EOB                     Vision Patient Visual Report: No change from baseline              Pertinent Vitals/Pain Pain Assessment Pain Assessment: Faces Faces Pain Scale: Hurts a little bit Pain Location: L LE Pain Descriptors / Indicators: Discomfort Pain Intervention(s): Repositioned        Extremity/Trunk Assessment Upper Extremity Assessment Upper Extremity Assessment: Generalized weakness   Lower Extremity Assessment Lower Extremity Assessment: Generalized weakness       Communication Communication Communication: No difficulties   Cognition Arousal/Alertness: Awake/alert Behavior During Therapy: Anxious Overall Cognitive Status: No family/caregiver present to determine baseline cognitive functioning                                 General Comments: A&Ox2. Hx of dementia, no caregiver to determine baseline cognition. Very anxious- frequently verbalizing fear of being alone in room and fear of falling while seated [place] expects to be discharged to:: Skilled nursing facility                                        Prior Functioning/Environment Prior Level of Function : Needs assist             Mobility Comments: Pt unable to report but per last chart report:  Pt has had a personal care aide for the last 1 year, but the past 3 months their care increased to 24 hrs/day. Pt would sleep sleep until 2pm & would stay awake until 3-4pm but recently has started sleeping more hours during the day, only waking up 30 min-2hours at a time then quickly returning to deep sleep. Pt has required more assistance & required help for gait, bathing, dressing, & feeding. Pt has been having more issues 2/2 neuropathy.          OT Problem List: Decreased strength;Decreased activity tolerance;Impaired balance (sitting and/or standing);Decreased cognition;Decreased safety awareness;Decreased knowledge of precautions;Impaired sensation      OT Treatment/Interventions: Self-care/ADL training;Therapeutic exercise;Energy conservation;DME and/or AE instruction;Therapeutic activities;Patient/family education;Balance training    OT Goals(Current goals can be found in the care plan section) Acute Rehab OT Goals Patient Stated Goal: to not fall OT Goal Formulation: With patient Time For Goal Achievement: 10/10/21 Potential to Achieve Goals: Fair ADL Goals Pt Will  Perform Grooming: with min guard assist;sitting Pt Will Perform Upper Body Dressing: with min guard assist;sitting Pt Will Perform Lower Body Dressing: with mod assist;sitting/lateral leans;with adaptive equipment  OT Frequency: Min 2X/week    Co-evaluation PT/OT/SLP Co-Evaluation/Treatment: Yes Reason for Co-Treatment: Complexity of the patient's impairments (multi-system involvement);Necessary to address cognition/behavior during functional activity;For patient/therapist safety;To address functional/ADL transfers PT goals addressed during session: Mobility/safety with mobility;Balance OT goals addressed during session: ADL's and self-care      AM-PAC OT "6 Clicks" Daily Activity     Outcome Measure Help from another person eating meals?: None Help from another person taking care of personal grooming?: A  Lot Help from another person toileting, which includes using toliet, bedpan, or urinal?: Total Help from another person bathing (including washing, rinsing, drying)?: A Lot Help from another person to put on and taking off regular upper body clothing?: A Little Help from another person to put on and taking off regular lower body clothing?: A Lot 6 Click Score: 14   End of Session Nurse Communication: Mobility status;Other (comment) (pt nauseous & vomiting)  Activity Tolerance: Patient tolerated treatment well Patient left: in bed;with call bell/phone within reach;with bed alarm set  OT Visit Diagnosis: Unsteadiness on feet (R26.81);Muscle weakness (generalized) (M62.81);Other symptoms and signs involving cognitive function                Time: 3832-9191 OT Time Calculation (min): 34 min Charges:  OT General Charges $OT Visit: 1 Visit OT Evaluation $OT Eval Moderate Complexity: 1 Mod OT Treatments $Self Care/Home Management : 8-22 mins  Fredirick Maudlin, OTR/L [place]

## 2021-09-26 NOTE — Progress Notes (Addendum)
PROGRESS NOTE    Judith Shaw  HLK:[id number] DOB: February 25, 1951 DOA: 09/23/2021 PCP: Pcp, No    Brief Narrative:   Judith Shaw is a 71 y.o. female with history of diabetes mellitus type 2, hyperlipidemia, depression, sleep apnea noncompliant with CPAP prior history of hysterectomy for uterine CA was brought to the ER from skilled nursing facility after patient had a brief episode of loss of consciousness. MRI brain did not show any acute changes. She has elevated D-dimer, CT angiogram chest showed bilateral PE.  She is started on IV heparin.  Switched to Eliquis on 1/29.  Assessment & Plan:   Principal Problem:   Acute encephalopathy Active Problems:   Diabetes (HCC)   Chronic diastolic CHF (congestive heart failure) (HCC)   Benign essential HTN   Dementia without behavioral disturbance (HCC)   Syncope   Lethargy   Pressure injury of skin   Bilateral pulmonary embolism (HCC)  Bilateral pulm emboli. Condition stable, no hypoxemia.  Continue Eliquis.  Acute encephalopathy. Syncope. Generalized weakness. Frequent falls. Echocardiogram showed ejection fraction 55 to 73%, grade 1 diastolic dysfunction.  No evidence of valvular disease. Patient mental status has improved.  Morbid obesity. Patient with a BMI of 35.99, with associated chronic diastolic congestive heart failure, hypertension and type 2 diabetes.   Type 2 diabetes. Continue current treatment.   Essential hypertension Continue home medicines.   DVT prophylaxis: Eliquis Code Status: Full Family Communication: Sister updated Disposition Plan: Discussed with case management, no discharge option today.  Status is: Inpatient   Remains inpatient appropriate because: Unsafe discharge.      I/O last 3 completed shifts: In: -  Out: 200 [Urine:200] Total I/O In: 60 [P.O.:60] Out: -      Consultants:  None  Procedures: None  Antimicrobials: None  Subjective: Patient is doing well today.  Denies any  short of breath or cough. No abdominal pain or nausea vomiting.  No diarrhea today.  Objective: Vitals:   09/25/21 2002 09/25/21 2338 09/26/21 0559 09/26/21 0744  BP: 131/74 (!) 140/53 138/73 (!) 143/95  Pulse: 72 66 80 81  Resp: 18 18 18 17   Temp: 97.6 F (36.4 C)  97.7 F (36.5 C) 98.4 F (36.9 C)  TempSrc: Oral  Oral Oral  SpO2: 96% 95% 94% 97%  Weight:        Intake/Output Summary (Last 24 hours) at 09/26/2021 1049 Last data filed at 09/26/2021 1018 Gross per 24 hour  Intake 60 ml  Output --  Net 60 ml   Filed Weights   09/24/21 0532  Weight: 98.1 kg    Examination:  General exam: Appears calm and comfortable  Respiratory system: Clear to auscultation. Respiratory effort normal. Cardiovascular system: S1 & S2 heard, RRR. No JVD, murmurs, rubs, gallops or clicks. No pedal edema. Gastrointestinal system: Abdomen is nondistended, soft and nontender. No organomegaly or masses felt. Normal bowel sounds heard. Central nervous system: Alert and oriented x2. No focal neurological deficits. Extremities: Symmetric 5 x 5 power. Skin: No rashes, lesions or ulcers Psychiatry: Judgement and insight appear normal. Mood & affect appropriate.     Data Reviewed: I have personally reviewed following labs and imaging studies  CBC: Recent Labs  Lab 09/23/21 1055 09/23/21 2029 09/24/21 0507 09/25/21 0621 09/26/21 0651  WBC 8.1 7.6 7.9 9.4 8.0  NEUTROABS 5.3  --  4.9  --   --   HGB 16.3* 16.3* 15.5* 15.2* 15.6*  HCT 51.2* 51.1* 47.8* 46.6* 48.5*  MCV 90.5 90.8  89.5 87.9 89.3  PLT 238 227 229 242 258   Basic Metabolic Panel: Recent Labs  Lab 09/23/21 1055 09/23/21 2029 09/24/21 0507  NA 137  --  138  K 4.9  --  4.7  CL 101  --  102  CO2 27  --  26  GLUCOSE 258*  --  166*  BUN 31*  --  32*  CREATININE 0.98 1.08* 0.96  CALCIUM 9.6  --  9.5   GFR: Estimated Creatinine Clearance: 62.3 mL/min (by C-G formula based on SCr of 0.96 mg/dL). Liver Function Tests: Recent  Labs  Lab 09/23/21 1055 09/24/21 0507  AST 27 27  ALT 24 22  ALKPHOS 113 103  BILITOT 0.5 0.4  PROT 7.4 6.8  ALBUMIN 3.1* 3.0*   Recent Labs  Lab 09/23/21 1055  LIPASE 33   Recent Labs  Lab 09/23/21 2029  AMMONIA 11   Coagulation Profile: Recent Labs  Lab 09/24/21 1417  INR 1.1   Cardiac Enzymes: Recent Labs  Lab 09/23/21 2029  CKTOTAL 242*   BNP (last 3 results) No results for input(s): PROBNP in the last 8760 hours. HbA1C: No results for input(s): HGBA1C in the last 72 hours. CBG: Recent Labs  Lab 09/25/21 0855 09/25/21 1214 09/25/21 1649 09/25/21 2044 09/26/21 0809  GLUCAP 133* 165* 186* 151* 173*   Lipid Profile: No results for input(s): CHOL, HDL, LDLCALC, TRIG, CHOLHDL, LDLDIRECT in the last 72 hours. Thyroid Function Tests: Recent Labs    09/23/21 1055  TSH 2.992  FREET4 1.09   Anemia Panel: No results for input(s): VITAMINB12, FOLATE, FERRITIN, TIBC, IRON, RETICCTPCT in the last 72 hours. Sepsis Labs: Recent Labs  Lab 09/23/21 1155  LATICACIDVEN 1.5    Recent Results (from the past 240 hour(s))  Resp Panel by RT-PCR (Flu A&B, Covid) Nasopharyngeal Swab     Status: None   Collection Time: 09/23/21 11:42 AM   Specimen: Nasopharyngeal Swab; Nasopharyngeal(NP) swabs in vial transport medium  Result Value Ref Range Status   SARS Coronavirus 2 by RT PCR NEGATIVE NEGATIVE Final    Comment: (NOTE) SARS-CoV-2 target nucleic acids are NOT DETECTED.  The SARS-CoV-2 RNA is generally detectable in upper respiratory specimens during the acute phase of infection. The lowest concentration of SARS-CoV-2 viral copies this assay can detect is 138 copies/mL. A negative result does not preclude SARS-Cov-2 infection and should not be used as the sole basis for treatment or other patient management decisions. A negative result may occur with  improper specimen collection/handling, submission of specimen other than nasopharyngeal swab, presence of  viral mutation(s) within the areas targeted by this assay, and inadequate number of viral copies(<138 copies/mL). A negative result must be combined with clinical observations, patient history, and epidemiological information. The expected result is Negative.  Fact Sheet for Patients:  EntrepreneurPulse.com.au  Fact Sheet for Healthcare Providers:  IncredibleEmployment.be  This test is no t yet approved or cleared by the [place] FDA and  has been authorized for detection and/or diagnosis of SARS-CoV-2 by FDA under an Emergency Use Authorization (EUA). This EUA will remain  in effect (meaning this test can be used) for the duration of the COVID-19 declaration under Section 564(b)(1) of the Act, 21 U.S.C.section 360bbb-3(b)(1), unless the authorization is terminated  or revoked sooner.       Influenza A by PCR NEGATIVE NEGATIVE Final   Influenza B by PCR NEGATIVE NEGATIVE Final    Comment: (NOTE) The Xpert Xpress SARS-CoV-2/FLU/RSV plus assay is intended as  an aid in the diagnosis of influenza from Nasopharyngeal swab specimens and should not be used as a sole basis for treatment. Nasal washings and aspirates are unacceptable for Xpert Xpress SARS-CoV-2/FLU/RSV testing.  Fact Sheet for Patients: EntrepreneurPulse.com.au  Fact Sheet for Healthcare Providers: IncredibleEmployment.be  This test is not yet approved or cleared by the [place] FDA and has been authorized for detection and/or diagnosis of SARS-CoV-2 by FDA under an Emergency Use Authorization (EUA). This EUA will remain in effect (meaning this test can be used) for the duration of the COVID-19 declaration under Section 564(b)(1) of the Act, 21 U.S.C. section 360bbb-3(b)(1), unless the authorization is terminated or revoked.  Performed at [facility], [address] [place], [place] [address]   Urine Culture     Status:  Abnormal   Collection Time: 09/23/21 11:51 PM   Specimen: Urine, Catheterized  Result Value Ref Range Status   Specimen Description   Final    URINE, CATHETERIZED Performed at [facility], [address] [place], [place] [address]    Special Requests   Final    NONE Performed at [facility], [address] [place], [place] [address]    Culture >=100,000 COLONIES/mL KLEBSIELLA PNEUMONIAE (A)  Final   Report Status 09/26/2021 FINAL  Final   Organism ID, Bacteria KLEBSIELLA PNEUMONIAE (A)  Final      Susceptibility   Klebsiella pneumoniae - MIC*    AMPICILLIN RESISTANT Resistant     CEFAZOLIN <=4 SENSITIVE Sensitive     CEFEPIME <=0.12 SENSITIVE Sensitive     CEFTRIAXONE <=0.25 SENSITIVE Sensitive     CIPROFLOXACIN <=0.25 SENSITIVE Sensitive     GENTAMICIN <=1 SENSITIVE Sensitive     IMIPENEM <=0.25 SENSITIVE Sensitive     NITROFURANTOIN 64 INTERMEDIATE Intermediate     TRIMETH/SULFA <=20 SENSITIVE Sensitive     AMPICILLIN/SULBACTAM 4 SENSITIVE Sensitive     PIP/TAZO <=4 SENSITIVE Sensitive     * >=100,000 COLONIES/mL KLEBSIELLA PNEUMONIAE         Radiology Studies: EEG adult  Result Date: Oct 24, 2021 Lora Havens, MD     10/24/2021  9:59 PM Patient Name: Judith Shaw MRN: [id number] Epilepsy Attending: Lora Havens Referring Physician/Provider: Rise Patience, MD Date: 24-Oct-2021 Duration: 20.17 mins Patient history: 71yo F with ams and syncope. EEG to evaluate for seizure Level of alertness: Awake AEDs during EEG study: None Technical aspects: This EEG study was done with scalp electrodes positioned according to the 10-20 International system of electrode placement. Electrical activity was acquired at a sampling rate of 500Hz  and reviewed with a high frequency filter of 70Hz  and a low frequency filter of 1Hz . EEG data were recorded continuously and digitally stored. Description: The posterior dominant rhythm consists of 7Hz  activity of moderate  voltage (25-35 uV) seen predominantly in posterior head regions, symmetric and reactive to eye opening and eye closing. EEG showed continuous generalized 5 to 7 Hz theta slowing. Physiologic photic driving was not seen during photic stimulation.  Hyperventilation was not performed.   ABNORMALITY - Continuous slow, generalized - Background slow IMPRESSION: This study is suggestive of mild to moderate diffuse encephalopathy, nonspecific etiology. No seizures or epileptiform discharges were seen throughout the recording. Lora Havens   ECHOCARDIOGRAM COMPLETE  Result Date: 2021-10-24    ECHOCARDIOGRAM REPORT   Patient Name:   Judith Shaw Date of Exam: 10/24/21 Medical Rec #:  [id number]   Height:       65.0 in Accession #:  [id number]  Weight:       216.3 lb Date of Birth:  [birth year]    BSA:          2.045 m Patient Age:    29 years    BP:           149/83 mmHg Patient Gender: F           HR:           76 bpm. Exam Location:  [facility] Procedure: 2D Echo Indications:     Syncope R55  History:         Patient has no prior history of Echocardiogram examinations.  Sonographer:     Kathlen Brunswick RDCS Referring Phys:  [place] Diagnosing Phys: Nelva Bush MD  Sonographer Comments: Technically difficult study due to poor echo windows. IMPRESSIONS  1. Left ventricular ejection fraction, by estimation, is 55 to 60%. The left ventricle has normal function. The left ventricle demonstrates regional wall motion abnormalities (see scoring diagram/findings for description). There is mild left ventricular  hypertrophy. Left ventricular diastolic parameters are consistent with Grade I diastolic dysfunction (impaired relaxation). There is of the left ventricular,.  2. Right ventricular systolic function is normal. The right ventricular size is normal.  3. The mitral valve is grossly normal. Trivial mitral valve regurgitation. No evidence of mitral stenosis.  4. The aortic valve was not well visualized.  Aortic valve regurgitation is not visualized. No aortic stenosis is present. FINDINGS  Left Ventricle: Left ventricular ejection fraction, by estimation, is 55 to 60%. The left ventricle has normal function. The left ventricle demonstrates regional wall motion abnormalities. The left ventricular internal cavity size was normal in size. There is mild left ventricular hypertrophy. Left ventricular diastolic parameters are consistent with Grade I diastolic dysfunction (impaired relaxation).  LV Wall Scoring: The basal inferolateral segment and basal inferior segment are dyskinetic. The entire anterior wall, antero-lateral wall, mid and distal lateral wall, entire septum, entire apex, and mid and distal inferior wall are normal. Right Ventricle: The right ventricular size is normal. Right vetricular wall thickness was not well visualized. Right ventricular systolic function is normal. Left Atrium: Left atrial size was normal in size. Right Atrium: Right atrial size was normal in size. Pericardium: The pericardium was not well visualized. Mitral Valve: The mitral valve is grossly normal. Trivial mitral valve regurgitation. No evidence of mitral valve stenosis. Tricuspid Valve: The tricuspid valve is not well visualized. Tricuspid valve regurgitation is trivial. Aortic Valve: The aortic valve was not well visualized. Aortic valve regurgitation is not visualized. No aortic stenosis is present. Aortic valve peak gradient measures 8.4 mmHg. Pulmonic Valve: The pulmonic valve was not well visualized. Aorta: The aortic root is normal in size and structure. Venous: The inferior vena cava was not well visualized. IAS/Shunts: The interatrial septum was not well visualized.  LEFT VENTRICLE PLAX 2D LVIDd:         4.30 cm     Diastology LVIDs:         3.00 cm     LV e' medial:    5.44 cm/s LV PW:         1.20 cm     LV E/e' medial:  14.0 LV IVS:        1.20 cm     LV e' lateral:   4.57 cm/s  LV E/e'  lateral: 16.7  LV Volumes (MOD) LV vol d, MOD A2C: 48.4 ml LV vol d, MOD A4C: 67.7 ml LV vol s, MOD A2C: 17.5 ml LV vol s, MOD A4C: 20.6 ml LV SV MOD A2C:     30.9 ml LV SV MOD A4C:     67.7 ml LV SV MOD BP:      38.7 ml RIGHT VENTRICLE RV Basal diam:  2.90 cm RV S prime:     15.90 cm/s TAPSE (M-mode): 1.9 cm LEFT ATRIUM             Index        RIGHT ATRIUM          Index LA Vol (A2C):   15.8 ml 7.73 ml/m   RA Area:     9.92 cm LA Vol (A4C):   23.3 ml 11.39 ml/m  RA Volume:   19.10 ml 9.34 ml/m LA Biplane Vol: 20.2 ml 9.88 ml/m  AORTIC VALVE AV Vmax:      145.00 cm/s AV Peak Grad: 8.4 mmHg LVOT Vmax:    106.00 cm/s LVOT Vmean:   74.000 cm/s LVOT VTI:     0.208 m  AORTA Ao Root diam: 2.80 cm MITRAL VALVE MV Area (PHT): 2.90 cm    SHUNTS MV Decel Time: 262 msec    Systemic VTI: 0.21 m MV E velocity: 76.30 cm/s MV A velocity: 98.50 cm/s MV E/A ratio:  0.77 Christopher End MD Electronically signed by Nelva Bush MD Signature Date/Time: 09/24/2021/3:27:17 PM    Final         Scheduled Meds:  apixaban  10 mg Oral BID   Followed by   Derrill Memo ON 10/02/2021] apixaban  5 mg Oral BID   ARIPiprazole  5 mg Oral Daily   busPIRone  10 mg Oral TID   donepezil  10 mg Oral QHS   gabapentin  300 mg Oral QHS   insulin aspart  0-5 Units Subcutaneous QHS   insulin aspart  0-9 Units Subcutaneous TID WC   insulin glargine-yfgn  15 Units Subcutaneous QHS   LORazepam  0.5 mg Oral Q8H   Continuous Infusions:   LOS: 2 days    Time spent: 27 minutes    Sharen Hones, MD [facility]   To contact the attending provider between 7A-7P or the covering provider during after hours 7P-7A, please log into the web site [email or web address] and access using universal [place] password for that web site. If you do not have the password, please call the hospital operator.  09/26/2021, 10:49 AM

## 2021-09-26 NOTE — TOC Initial Note (Signed)
Transition of Care [facility]) - Initial/Assessment Note    Patient Details  Name: Judith Shaw MRN: [id number] Date of Birth: 02-15-1951  Transition of Care [facility]) CM/SW Contact:    Pete Pelt, RN Phone Number: 09/26/2021, 12:36 PM  Clinical Narrative:   Delayed entry from 01/29:  Spoke to patient's sister and POA [name].  Sherlon Handing states that she would like patient to have another chance at a skilled nursing facility for rehabilitation.  RNCM explained to sister Medicare allowances for rehab.  Sister states she has appealed twice since patient was admitted to [facility] and she won both appeals.  Sister has not appealed the current SNF stay at this time, but she believes that patient should have had a renew SNF due to pulmonary emboli.  Sister [name] states that patient has a private pay care aide at bedside at all times, currently at bedside now.  She states that this is due to patient's special needs.  Sister also explained that her diabetes condition and special needs prohibit patient from participation with physical and occupational therapy.  RNCM explained that rehabilitation can only be effective with patient participation.  Today 01/30: Sister states she would like for [name] to continue to try to get short term SNF, despite Medicare guidelines.  Sister [name] states that she has been advised to apply for Medicaid for patient in the past, but she has not applied.  She stated she has to go through [place] as that is where she lives, but would like assistance from [facility].  RNCM advised sister to contact DSS ASAP, as the Medicaid process takes 90 days to complete.  Sister states that patient's home will not accommodate a hospital bed and 2 caregivers, and returning home is not an option.  RNCM explained that without Medicaid it would be challenging to place patient in [place] at this point.    Sister continued to state she would like to admit patient to [facility].  RNCM explained that a bed search  could be done, however we have reviewed the rehabilitation specifics at this time.  Sister states she will call DSS today and would like follow up from care team regularly about patient medications and condition.  Request sent to care team.         Expected Discharge Plan: Skilled Nursing Facility Barriers to Discharge: Continued Medical Work up   Patient Goals and CMS Choice        Expected Discharge Plan and Services Expected Discharge Plan: [place] Acute Care Choice: [place] (LTAC) Living arrangements for the past 2 months: [place]                                      Prior Living Arrangements/Services Living arrangements for the past 2 months: [place] Lives with:: Facility Resident Patient language and need for interpreter reviewed:: Yes        Need for Family Participation in Patient Care: Yes (Comment) Care giver support system in place?: Yes (comment)   Criminal Activity/Legal Involvement Pertinent to Current Situation/Hospitalization: No - Comment as needed  Activities of Daily Living   ADL Screening (condition at time of admission) Patient's cognitive ability adequate to safely complete daily activities?: No Is the patient deaf or have difficulty hearing?: No Does the patient have difficulty seeing, even when wearing glasses/contacts?: No Does  the patient have difficulty concentrating, remembering, or making decisions?: Yes Patient able to express need for assistance with ADLs?: Yes Does the patient have difficulty dressing or bathing?: Yes Independently performs ADLs?: No Communication: Needs assistance Is this a change from baseline?: Pre-admission baseline Dressing (OT): Needs assistance Is this a change from baseline?: Pre-admission baseline Grooming: Needs assistance Is this a change from baseline?: Pre-admission baseline Feeding: Needs assistance Is this a change from  baseline?: Pre-admission baseline Bathing: Needs assistance Is this a change from baseline?: Pre-admission baseline Toileting: Needs assistance Is this a change from baseline?: Pre-admission baseline In/Out Bed: Needs assistance Is this a change from baseline?: Pre-admission baseline Walks in Home: Needs assistance Is this a change from baseline?: Pre-admission baseline Does the patient have difficulty walking or climbing stairs?: Yes Weakness of Legs: Both Weakness of Arms/Hands: None  Permission Sought/Granted Permission sought to share information with : Case Manager, Customer service manager Permission granted to share information with : Yes, Verbal Permission Granted     Permission granted to share info w AGENCY: [facility]        Emotional Assessment Appearance:: Appears stated age Attitude/Demeanor/Rapport: Complaining Affect (typically observed): Pleasant Orientation: : Oriented to Self, Oriented to Place, Oriented to  Time, Oriented to Situation Alcohol / Substance Use: Not Applicable Psych Involvement: No (comment)  Admission diagnosis:  Syncope [R55] Acute encephalopathy [G93.40] Altered mental status, unspecified altered mental status type [R41.82] Syncope, unspecified syncope type [R55] Bilateral pulmonary embolism (HCC) [I26.99] Patient Active Problem List   Diagnosis Date Noted   Pressure injury of skin 09/24/2021   Bilateral pulmonary embolism ([place]) 09/24/2021   Syncope 09/23/2021   Lethargy 09/23/2021   Acute encephalopathy 09/23/2021   History of insulin dependent diabetes mellitus    Aspiration pneumonia of lower lobe ([place])    Lethargic    Encephalopathy 16/05/9603   Acute metabolic encephalopathy 54/04/8118   Acute pyelonephritis 07/28/2021   Severe sepsis ([place]) 07/28/2021   Severe sepsis with acute organ dysfunction ([name]) 07/27/2021   Atherosclerosis of native arteries of the extremities with ulceration ([place]) 02/24/2020   S/P  amputation of lesser toe, left ([place]) 01/13/2020   Dementia without behavioral disturbance ([place]) 12/16/2019   Acute osteomyelitis of left foot ([place]) 12/15/2019   Memory impairment 11/30/2019   Complaints of memory disturbance 11/20/2019   Diabetic ulcer of left midfoot associated with type 2 diabetes mellitus ([place]) 11/18/2019   Seizure-like activity ([place]) 07/15/2019   Intractable 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 06/19/2019   Uncontrolled type 2 diabetes mellitus with hyperglycemia, with long-term current use of insulin ([place]) 04/22/2019   Panic attacks 03/26/2019   Benign essential HTN 07/19/2018   Fatty liver 05/15/2018   Hx of adenomatous colonic polyps 05/15/2018   Type 2 diabetes mellitus with diabetic nephropathy, with long-term current use of insulin ([place]) 05/01/2018   Tinnitus, bilateral 04/05/2017   Concussion syndrome 04/03/2017   Concussion without loss of consciousness 01/24/2017   Difficulty walking 01/24/2017   Headache disorder 01/24/2017   Numbness and tingling 01/24/2017   Postural urinary incontinence 01/24/2017   Sepsis ([place]) 09/21/2016   UTI (urinary tract infection) 09/21/2016   HTN (hypertension) 09/21/2016   Diabetes ([place]) 09/21/2016   Depression 09/21/2016   Health care maintenance 10/05/2015   Recurrent major depressive disorder, in full remission ([place]) 06/16/2014   DM (diabetes mellitus) type II controlled, neurological manifestation ([place]) 06/12/2014   Morbid obesity ([place]) 06/12/2014   Hyperlipidemia 03/10/2014   Chronic diastolic CHF (congestive heart  failure) ([place]) 97/33/1250   H/O diastolic dysfunction 87/19/9412   Sleep apnea 03/10/2014   SOB (shortness of breath) 03/10/2014   PCP:  Pcp, No Pharmacy:   [facility] [address], [address] AT [address] [place] [address] Phone: [phone] Fax: [phone]     Social Determinants of Health  (SDOH) Interventions    Readmission Risk Interventions Readmission Risk Prevention Plan 08/12/2021  Transportation Screening Complete  PCP or Specialist Appt within 3-5 Days Complete  HRI or [place] Complete  Social Work Consult for [place] Planning/Counseling Complete  Palliative Care Screening Not Applicable  Medication Review Press photographer) Complete  Some recent data might be hidden

## 2021-09-26 NOTE — Progress Notes (Addendum)
Patient yelling out "help me" this caused another patient on the unit to start yelling out too; upon entering the pt's room the pt verbalized that she needs to be turned; pt just turned at 2200; turned pt to the left side per her request; educated pt to use her handheld callbell when she has a request and not to yell out because she is waking other patients up on the unit; pt verbalized understanding and said if we don't come she will yell out and I informed her that this was not acceptable behavior and security would be notifed

## 2021-09-26 NOTE — Progress Notes (Signed)
Security officer previously spoke to the pt about inappropriate behavior (yelling out) and that it was not tolerated in this hospital

## 2021-09-26 NOTE — Progress Notes (Addendum)
Pt yelling out "come here, help me"; staff nurse tech went into the pt's room while said RN called security and asked them to come to the floor and talk to the pt

## 2021-09-26 NOTE — Progress Notes (Signed)
Physical Therapy Treatment Patient Details Name: Judith Shaw MRN: [id number] DOB: Nov 27, 1950 Today's Date: 09/26/2021   History of Present Illness Pt is a 71 y/o F admitted on 09/23/21 who presented to the ED from SNF after pt had a brief episode of LOC. MRI was negative for any acute changes. Pt with elevated d-dimer, CT angiogram chest showed bilateral PE. Pt was started on IV heparin. PMH: DM2, HLD, depression, sleep apnea noncompliant with CPAP, hysterectomy for uterine CA. MD cleared pt for participation in therapy prior to completing 48 hrs of anticoagulation.    PT Comments    Pt seen for PT tx with co-tx with OT. Pt received in bed & agreeable to transferring to sitting EOB with encouragement with pt requiring +2 assist. Pt sits EOB x 10 minutes with supervision<>mod assist with pt demonstrating posterior lean/LOB. Pt engages in BLE strengthening exercises & balance activities while sitting EOB. Upon return supine OT observed pt to have nystagmus with pt reporting nausea followed by vomiting - nurse notified. Will continue to follow pt acutely to progress mobility as able.    Recommendations for follow up therapy are one component of a multi-disciplinary discharge planning process, led by the attending physician.  Recommendations may be updated based on patient status, additional functional criteria and insurance authorization.  Follow Up Recommendations  Skilled nursing-short term rehab (<3 hours/day)     Assistance Recommended at Discharge Frequent or constant Supervision/Assistance  Patient can return home with the following Two people to help with walking and/or transfers;Two people to help with bathing/dressing/bathroom;Direct supervision/assist for medications management;Help with stairs or ramp for entrance;Assist for transportation;Direct supervision/assist for financial management;Assistance with cooking/housework   Equipment Recommendations  None recommended by PT     Recommendations for Other Services       Precautions / Restrictions Precautions Precautions: Fall Restrictions Weight Bearing Restrictions: No     Mobility  Bed Mobility Overal bed mobility: Needs Assistance Bed Mobility: Supine to Sit, Sit to Supine     Supine to sit: Max assist, +2 for physical assistance, HOB elevated Sit to supine: Mod assist, +2 for physical assistance (Pt able to slowly lower head onto bed but unable to elevate BLE onto bed. Upon return sit>supine pt noted to have nystagmus by OT with pt reporting immediate nausea followed by vomiting.)        Transfers                        Ambulation/Gait                   Stairs             Wheelchair Mobility    Modified Rankin (Stroke Patients Only)       Balance Overall balance assessment: Needs assistance Sitting-balance support: Bilateral upper extremity supported, Feet supported Sitting balance-Leahy Scale: Poor Sitting balance - Comments: Pt able to maintain static sitting with as little as supervision for brief periods of time, otherwise requires mod assist & pt with posterior LOB with no righting reactions or attempts to correct. Pt very fearful after LOB & PT/OT assisting her back to sitting upright. Pt does scoot along side of EOB with total assist +2 with PT/OT providing head/hips relationship to increase ease of movement. Postural control: Posterior lean  Cognition Arousal/Alertness: Awake/alert Behavior During Therapy: Anxious Overall Cognitive Status: No family/caregiver present to determine baseline cognitive functioning                                 General Comments: A&Ox2. Hx of dementia, no caregiver to determine baseline cognition. Very anxious- frequently verbalizing fear of being alone in room and fear of falling while seated EOB        Exercises General Exercises - Lower Extremity Long Arc  Quad: AROM, Strengthening, Both, Seated, 10 reps Other Exercises Other Exercises: Pt sat EOB x10 minutes. Pt engaged in reaching with 1UE with other UE supported on EOB with min/mod assist with PT faciliating anterior weight shift & cuing for core activation.    General Comments        Pertinent Vitals/Pain Pain Assessment Pain Assessment: Faces Faces Pain Scale: Hurts a little bit Pain Location: L LE Pain Descriptors / Indicators: Discomfort Pain Intervention(s): Repositioned    Home Living Family/patient expects to be discharged to:: Skilled nursing facility                        Prior Function            PT Goals (current goals can now be found in the care plan section) Acute Rehab PT Goals Patient Stated Goal: none stated PT Goal Formulation: With patient Time For Goal Achievement: 10/09/21 Potential to Achieve Goals: Fair Progress towards PT goals: Progressing toward goals    Frequency    Min 2X/week      PT Plan Current plan remains appropriate    Co-evaluation   Reason for Co-Treatment: Complexity of the patient's impairments (multi-system involvement);Necessary to address cognition/behavior during functional activity;For patient/therapist safety PT goals addressed during session: Mobility/safety with mobility;Balance OT goals addressed during session: ADL's and self-care      AM-PAC PT "6 Clicks" Mobility   Outcome Measure  Help needed turning from your back to your side while in a flat bed without using bedrails?: Total Help needed moving from lying on your back to sitting on the side of a flat bed without using bedrails?: Total Help needed moving to and from a bed to a chair (including a wheelchair)?: Total Help needed standing up from a chair using your arms (e.g., wheelchair or bedside chair)?: Total Help needed to walk in hospital room?: Total Help needed climbing 3-5 steps with a railing? : Total 6 Click Score: 6    End of Session    Activity Tolerance: Patient limited by fatigue (requires encouragement to continue participating as pt requests to lie down after briefly sitting EOB) Patient left: in bed;with call bell/phone within reach;with bed alarm set Nurse Communication: Mobility status (+ emesis during session) PT Visit Diagnosis: Unsteadiness on feet (R26.81);Muscle weakness (generalized) (M62.81);Difficulty in walking, not elsewhere classified (R26.2)     Time: 6967-8938 PT Time Calculation (min) (ACUTE ONLY): 27 min  Charges:  $Therapeutic Activity: 8-22 mins                     Lavone Nian, PT, DPT 09/26/21, 12:56 PM    Waunita Schooner 09/26/2021, 12:54 PM

## 2021-09-27 DIAGNOSIS — I2699 Other pulmonary embolism without acute cor pulmonale: Secondary | ICD-10-CM | POA: Diagnosis not present

## 2021-09-27 DIAGNOSIS — G9340 Encephalopathy, unspecified: Secondary | ICD-10-CM | POA: Diagnosis not present

## 2021-09-27 DIAGNOSIS — I5032 Chronic diastolic (congestive) heart failure: Secondary | ICD-10-CM | POA: Diagnosis not present

## 2021-09-27 LAB — GLUCOSE, CAPILLARY
Glucose-Capillary: 145 mg/dL — ABNORMAL HIGH (ref 70–99)
Glucose-Capillary: 138 mg/dL — ABNORMAL HIGH (ref 70–99)
Glucose-Capillary: 193 mg/dL — ABNORMAL HIGH (ref 70–99)
Glucose-Capillary: 141 mg/dL — ABNORMAL HIGH (ref 70–99)

## 2021-09-27 MED ORDER — FOSFOMYCIN TROMETHAMINE 3 G PO PACK
3.0000 g | PACK | Freq: Once | ORAL | Status: AC
  Administered 2021-09-27: 16:00:00 3 g via ORAL
  Filled 2021-09-27: qty 3

## 2021-09-27 NOTE — Progress Notes (Signed)
Pt very sleepy, asked Hachita to come back another time

## 2021-09-27 NOTE — Progress Notes (Signed)
PT Cancellation Note  Patient Details Name: Judith Shaw MRN: [id number] DOB: Apr 03, 1951   Cancelled Treatment:    Reason Eval/Treat Not Completed: Other (comment).  Pt resting in bed upon PT arrival (nurse reports pt has been sleeping a lot today).  Pt refusing PT session d/t being too tired and wanting to rest.  Attempted to encourage pt to participate in therapy but pt continued to refuse.  Will re-attempt PT session at a later date/time.  Leitha Bleak, PT 09/27/21, 4:24 PM

## 2021-09-27 NOTE — Plan of Care (Signed)

## 2021-09-27 NOTE — Care Management Important Message (Signed)
Important Message  Patient Details  Name: Judith Shaw MRN: [id number] Date of Birth: 1951-04-03   Medicare Important Message Given:  N/A - LOS <3 / Initial given by admissions     [name] 09/27/2021, 7:14 AM

## 2021-09-27 NOTE — Progress Notes (Addendum)
PROGRESS NOTE    Judith Shaw  UXL:[id number] DOB: 06/03/1951 DOA: 09/23/2021 PCP: Pcp, No   Chief complaint.  Altered mental status. Brief Narrative:   Judith Shaw is a 71 y.o. female with history of diabetes mellitus type 2, hyperlipidemia, depression, sleep apnea noncompliant with CPAP prior history of hysterectomy for uterine CA was brought to the ER from skilled nursing facility after patient had a brief episode of loss of consciousness. MRI brain did not show any acute changes. She has elevated D-dimer, CT angiogram chest showed bilateral PE.  She is started on IV heparin.  Switched to Eliquis on 1/29.  Assessment & Plan:   Principal Problem:   Acute encephalopathy Active Problems:   Diabetes (HCC)   Chronic diastolic CHF (congestive heart failure) (HCC)   Benign essential HTN   Dementia without behavioral disturbance (HCC)   Syncope   Lethargy   Pressure injury of skin   Bilateral pulmonary embolism (HCC)  Bilateral pulm emboli. Eliquis.  Condition stable.  Acute metabolic encephalopathy. Syncope. Generalized weakness. Frequent falls. Echocardiogram showed ejection fraction 55 to 53%, grade 1 diastolic dysfunction.  No evidence of valvular disease. Syncope appears to be secondary to PE.  Patient still has significant weakness, continue PT/OT.  Klebsiella pneumonia UTI. Will give a dose of fosfomycin.   Morbid obesity. Patient with a BMI of 35.99, with associated chronic diastolic congestive heart failure, hypertension and type 2 diabetes.    Type 2 diabetes. Continue current treatment.   Essential hypertension Continue home medicines.  Pressure ulcer POA Pressure Injury 09/23/21 Coccyx Stage 2 -  Partial thickness loss of dermis presenting as a shallow open injury with a red, pink wound bed without slough. (Active)  09/23/21 1919  Location: Coccyx  Location Orientation:   Staging: Stage 2 -  Partial thickness loss of dermis presenting as a shallow open  injury with a red, pink wound bed without slough.  Wound Description (Comments):   Present on Admission: Yes         DVT prophylaxis: Eliquis Code Status: Full Family Communication: Sister updated Disposition Plan: Discussed with case management, no discharge option.   Status is: Inpatient   Remains inpatient appropriate because: Unsafe discharge.    Planned Discharge Destination: Skilled nursing facility          I/O last 3 completed shifts: In: 260 [P.O.:260] Out: 300 [Urine:300] No intake/output data recorded.     Consultants:  None  Procedures: None  Antimicrobials: None  Subjective: Has some baseline confusion, slept well last night. Denies any short of breath or cough. No chest pain. No fever or chills  Objective: Vitals:   09/26/21 1708 09/26/21 2121 09/27/21 0558 09/27/21 0823  BP: 118/62 (!) 141/82 (!) 148/68 (!) 121/59  Pulse: 82 72 (!) 59 73  Resp: 14 18 18 20   Temp: 97.6 F (36.4 C) (!) 97.5 F (36.4 C) (!) 97.5 F (36.4 C) (!) 97.3 F (36.3 C)  TempSrc:  Oral Oral Axillary  SpO2: 99% 95% 96% 98%  Weight:        Intake/Output Summary (Last 24 hours) at 09/27/2021 1025 Last data filed at 09/26/2021 1842 Gross per 24 hour  Intake 200 ml  Output 300 ml  Net -100 ml   Filed Weights   09/24/21 0532  Weight: 98.1 kg    Examination:  General exam: Appears calm and comfortable, morbid obesity. Respiratory system: Clear to auscultation. Respiratory effort normal. Cardiovascular system: S1 & S2 heard, RRR. No JVD, murmurs,  rubs, gallops or clicks. No pedal edema. Gastrointestinal system: Abdomen is nondistended, soft and nontender. No organomegaly or masses felt. Normal bowel sounds heard. Central nervous system: Alert and oriented x2. No focal neurological deficits. Extremities: Symmetric 5 x 5 power. Skin: No rashes, lesions or ulcers Psychiatry: Judgement and insight appear normal. Mood & affect appropriate.     Data  Reviewed: I have personally reviewed following labs and imaging studies  CBC: Recent Labs  Lab 09/23/21 1055 09/23/21 2029 09/24/21 0507 09/25/21 0621 09/26/21 0651  WBC 8.1 7.6 7.9 9.4 8.0  NEUTROABS 5.3  --  4.9  --   --   HGB 16.3* 16.3* 15.5* 15.2* 15.6*  HCT 51.2* 51.1* 47.8* 46.6* 48.5*  MCV 90.5 90.8 89.5 87.9 89.3  PLT 238 227 229 242 287   Basic Metabolic Panel: Recent Labs  Lab 09/23/21 1055 09/23/21 2029 09/24/21 0507  NA 137  --  138  K 4.9  --  4.7  CL 101  --  102  CO2 27  --  26  GLUCOSE 258*  --  166*  BUN 31*  --  32*  CREATININE 0.98 1.08* 0.96  CALCIUM 9.6  --  9.5   GFR: Estimated Creatinine Clearance: 62.3 mL/min (by C-G formula based on SCr of 0.96 mg/dL). Liver Function Tests: Recent Labs  Lab 09/23/21 1055 09/24/21 0507  AST 27 27  ALT 24 22  ALKPHOS 113 103  BILITOT 0.5 0.4  PROT 7.4 6.8  ALBUMIN 3.1* 3.0*   Recent Labs  Lab 09/23/21 1055  LIPASE 33   Recent Labs  Lab 09/23/21 2029  AMMONIA 11   Coagulation Profile: Recent Labs  Lab 09/24/21 1417  INR 1.1   Cardiac Enzymes: Recent Labs  Lab 09/23/21 2029  CKTOTAL 242*   BNP (last 3 results) No results for input(s): PROBNP in the last 8760 hours. HbA1C: No results for input(s): HGBA1C in the last 72 hours. CBG: Recent Labs  Lab 09/26/21 0809 09/26/21 1200 09/26/21 1609 09/26/21 2117 09/27/21 0824  GLUCAP 173* 165* 167* 170* 145*   Lipid Profile: No results for input(s): CHOL, HDL, LDLCALC, TRIG, CHOLHDL, LDLDIRECT in the last 72 hours. Thyroid Function Tests: No results for input(s): TSH, T4TOTAL, FREET4, T3FREE, THYROIDAB in the last 72 hours. Anemia Panel: No results for input(s): VITAMINB12, FOLATE, FERRITIN, TIBC, IRON, RETICCTPCT in the last 72 hours. Sepsis Labs: Recent Labs  Lab 09/23/21 1155  LATICACIDVEN 1.5    Recent Results (from the past 240 hour(s))  Resp Panel by RT-PCR (Flu A&B, Covid) Nasopharyngeal Swab     Status: None    Collection Time: 09/23/21 11:42 AM   Specimen: Nasopharyngeal Swab; Nasopharyngeal(NP) swabs in vial transport medium  Result Value Ref Range Status   SARS Coronavirus 2 by RT PCR NEGATIVE NEGATIVE Final    Comment: (NOTE) SARS-CoV-2 target nucleic acids are NOT DETECTED.  The SARS-CoV-2 RNA is generally detectable in upper respiratory specimens during the acute phase of infection. The lowest concentration of SARS-CoV-2 viral copies this assay can detect is 138 copies/mL. A negative result does not preclude SARS-Cov-2 infection and should not be used as the sole basis for treatment or other patient management decisions. A negative result may occur with  improper specimen collection/handling, submission of specimen other than nasopharyngeal swab, presence of viral mutation(s) within the areas targeted by this assay, and inadequate number of viral copies(<138 copies/mL). A negative result must be combined with clinical observations, patient history, and epidemiological information. The expected result  is Negative.  Fact Sheet for Patients:  EntrepreneurPulse.com.au  Fact Sheet for Healthcare Providers:  IncredibleEmployment.be  This test is no t yet approved or cleared by the [place] FDA and  has been authorized for detection and/or diagnosis of SARS-CoV-2 by FDA under an Emergency Use Authorization (EUA). This EUA will remain  in effect (meaning this test can be used) for the duration of the COVID-19 declaration under Section 564(b)(1) of the Act, 21 U.S.C.section 360bbb-3(b)(1), unless the authorization is terminated  or revoked sooner.       Influenza A by PCR NEGATIVE NEGATIVE Final   Influenza B by PCR NEGATIVE NEGATIVE Final    Comment: (NOTE) The Xpert Xpress SARS-CoV-2/FLU/RSV plus assay is intended as an aid in the diagnosis of influenza from Nasopharyngeal swab specimens and should not be used as a sole basis for treatment.  Nasal washings and aspirates are unacceptable for Xpert Xpress SARS-CoV-2/FLU/RSV testing.  Fact Sheet for Patients: EntrepreneurPulse.com.au  Fact Sheet for Healthcare Providers: IncredibleEmployment.be  This test is not yet approved or cleared by the [place] FDA and has been authorized for detection and/or diagnosis of SARS-CoV-2 by FDA under an Emergency Use Authorization (EUA). This EUA will remain in effect (meaning this test can be used) for the duration of the COVID-19 declaration under Section 564(b)(1) of the Act, 21 U.S.C. section 360bbb-3(b)(1), unless the authorization is terminated or revoked.  Performed at [facility], [address] [place], [place] [address]   Urine Culture     Status: Abnormal   Collection Time: 09/23/21 11:51 PM   Specimen: Urine, Catheterized  Result Value Ref Range Status   Specimen Description   Final    URINE, CATHETERIZED Performed at [facility], [address] [place], [place] [address]    Special Requests   Final    NONE Performed at [facility], [address], [place] [address]    Culture >=100,000 COLONIES/mL KLEBSIELLA PNEUMONIAE (A)  Final   Report Status 09/26/2021 FINAL  Final   Organism ID, Bacteria KLEBSIELLA PNEUMONIAE (A)  Final      Susceptibility   Klebsiella pneumoniae - MIC*    AMPICILLIN RESISTANT Resistant     CEFAZOLIN <=4 SENSITIVE Sensitive     CEFEPIME <=0.12 SENSITIVE Sensitive     CEFTRIAXONE <=0.25 SENSITIVE Sensitive     CIPROFLOXACIN <=0.25 SENSITIVE Sensitive     GENTAMICIN <=1 SENSITIVE Sensitive     IMIPENEM <=0.25 SENSITIVE Sensitive     NITROFURANTOIN 64 INTERMEDIATE Intermediate     TRIMETH/SULFA <=20 SENSITIVE Sensitive     AMPICILLIN/SULBACTAM 4 SENSITIVE Sensitive     PIP/TAZO <=4 SENSITIVE Sensitive     * >=100,000 COLONIES/mL KLEBSIELLA PNEUMONIAE         Radiology Studies: No results  found.      Scheduled Meds:  apixaban  10 mg Oral BID   Followed by   Derrill Memo ON 10/02/2021] apixaban  5 mg Oral BID   ARIPiprazole  5 mg Oral Daily   busPIRone  10 mg Oral TID   donepezil  10 mg Oral QHS   gabapentin  300 mg Oral QHS   insulin aspart  0-5 Units Subcutaneous QHS   insulin aspart  0-9 Units Subcutaneous TID WC   insulin glargine-yfgn  15 Units Subcutaneous QHS   LORazepam  0.5 mg Oral Q8H   Continuous Infusions:   LOS: 3 days    Time spent: 27 minutes    Sharen Hones, MD [facility]  To contact the attending provider between 7A-7P or the covering provider during after hours 7P-7A, please log into the web site [email or web address] and access using universal [place] password for that web site. If you do not have the password, please call the hospital operator.  09/27/2021, 10:25 AM

## 2021-09-27 NOTE — Progress Notes (Signed)
°  Chaplain On-Call responded to Spiritual Care Consult Order from RN Hurman Horn: "Patient wants someone by her side to talk to".  Chaplain went to patient's room. She was sleeping soundly.  Chaplain will notify the next Chaplain On-Call to follow up.  Chaplain Pollyann Samples M.Div., [facility]

## 2021-09-28 DIAGNOSIS — G9340 Encephalopathy, unspecified: Secondary | ICD-10-CM | POA: Diagnosis not present

## 2021-09-28 LAB — GLUCOSE, CAPILLARY
Glucose-Capillary: 141 mg/dL — ABNORMAL HIGH (ref 70–99)
Glucose-Capillary: 203 mg/dL — ABNORMAL HIGH (ref 70–99)
Glucose-Capillary: 130 mg/dL — ABNORMAL HIGH (ref 70–99)
Glucose-Capillary: 140 mg/dL — ABNORMAL HIGH (ref 70–99)

## 2021-09-28 MED ORDER — LACTATED RINGERS IV BOLUS
500.0000 mL | Freq: Once | INTRAVENOUS | Status: AC
  Administered 2021-09-28: 05:00:00 500 mL via INTRAVENOUS

## 2021-09-28 NOTE — Progress Notes (Signed)
Patient's BP 82/52, MAP 62, HR 66, SpaO2 100% RA. Patient is alert to person and place. Placed patient on 2L O2 via [place] and contacted on-call NP. Received a one-time order for IV bollus LR 577ml at 55ml/hr.

## 2021-09-28 NOTE — TOC Progression Note (Signed)
Transition of Care [facility]) - Progression Note    Patient Details  Name: Judith Shaw MRN: [id number] Date of Birth: 04-20-1951  Transition of Care [facility]) CM/SW [place], RN Phone Number: 09/28/2021, 4:15 PM  Clinical Narrative:   PT in to see patient, she states she is having a difficult time working with therapy.  Private nursing assistant working with patient verifies she has a challenging time working with therapy.    Discussed options with patient's sister [name], who states she is power of attorney, over the phone, as she lives in [place].  [name] states she will look into DSS/Medicaid in [place].  [name] also states that patient has special needs and she would like to pursue SNF again.   RNCM explained again that there would most likely be high copays or self pay as she has not had 60 well days in between stays.  [name] states she would like to look into SNF for patient as she does not believe that patient is appropriate for [place].    Options for patient at this time:  Long term care if they are able to get Medicaid (or private pay), Home with potential home health or private pay aides, or private pay to return to [facility].  Other options include sending patient to Memory Care such as [facility], or [facility].  Sister [name] will be in tomorrow to discuss goals of care and disposition plan, as she has flown in from [place] to see patient.  TOC contact information provided, TOC to follow to discharge.    Expected Discharge Plan: [place] Barriers to Discharge: Continued Medical Work up  Expected Discharge Plan and Services Expected Discharge Plan: [place] Choice: [place] (LTAC) Living arrangements for the past 2 months: [place]                                       Social Determinants of Health (SDOH) Interventions    Readmission Risk  Interventions Readmission Risk Prevention Plan 08/12/2021  Transportation Screening Complete  PCP or Specialist Appt within 3-5 Days Complete  HRI or [place] Complete  Social Work Consult for [place] Planning/Counseling Complete  Palliative Care Screening Not Applicable  Medication Review Press [employer]) Complete  Some recent data might be hidden

## 2021-09-28 NOTE — Progress Notes (Signed)
Patient is lethargic and drowsy. BP 112/58, MAP 75, HR 76, RR 17, SpaO2 100% 2L O2 via [place]. Held scheduled Ativan 0.5mg .

## 2021-09-28 NOTE — Consult Note (Signed)
WOC Nurse Consult Note: Patient receiving care in [facility]. Reason for Consult: "Specialty Bed Request Pressure Injury on Sacrum" Wound type: flowsheet has a stage 2 listed for the sacrum I spoke with the Charge Nurse, Denice Paradise, and explained that the [facility] nurses do not have to be consulted for an order for a specialty bed. Also, that the patient is currently up in a recliner and asleep.  I further explained how to obtain an order from the provider and how to obtain a specialty bed from [facility].  I did not view the sacrum as the patient is currently asleep.  Denice Paradise is to follow up on getting an order for the bed and obtaining from [facility]. [place] nurse will not follow at this time.  Please re-consult the [place] team if needed.  Val Riles, RN, MSN, CWOCN, CNS-BC, pager [phone]

## 2021-09-28 NOTE — Progress Notes (Signed)
PROGRESS NOTE    Judith Shaw  EXB:[id number] DOB: [birth year] DOA: 09/23/2021 PCP: Pcp, No  112A/112A-AA   Assessment & Plan:   Principal Problem:   Acute encephalopathy Active Problems:   Diabetes ([place])   Chronic diastolic CHF (congestive heart failure) (HCC)   Benign essential HTN   Dementia without behavioral disturbance ([place])   Syncope   Lethargy   Pressure injury of skin   Bilateral pulmonary embolism (HCC)   Mylynn L Shaw is a 71 y.o. female with history of diabetes mellitus type 2, hyperlipidemia, depression, sleep apnea noncompliant with CPAP prior history of hysterectomy for uterine CA was brought to the ER from skilled nursing facility after patient had a brief episode of loss of consciousness. MRI brain did not show any acute changes. She has elevated D-dimer, CT angiogram chest showed bilateral PE.  She is started on IV heparin.  Switched to Eliquis on 1/29.   Bilateral pulm emboli. --cont Eliquis  Acute metabolic encephalopathy, resolved Syncope. Generalized weakness. Frequent falls. Syncope appears to be secondary to PE. Patient still has significant weakness, continue PT/OT.   Klebsiella pneumonia UTI. --s/p a dose of fosfomycin.   Morbid obesity. Patient with a BMI of 38, with associated chronic diastolic congestive heart failure, hypertension and type 2 diabetes.   Type 2 diabetes. --cont glargine 15u nightly --SSI   Essential hypertension --not on BP med PTA --monitor   Pressure ulcer POA Pressure Injury 09/23/21 Coccyx Stage 2 -  Partial thickness loss of dermis presenting as a shallow open injury with a red, pink wound bed without slough. (Active)  09/23/21 1919  Location: Coccyx  Location Orientation:   Staging: Stage 2 -  Partial thickness loss of dermis presenting as a shallow open injury with a red, pink wound bed without slough.  Wound Description (Comments):   Present on Admission: Yes   --RN to order specialty bed   DVT prophylaxis:  [id number] Code Status: Full code  Family Communication:  Level of care: Med-Surg Dispo:   The patient is from: SNF Anticipated d/c is to: PT rec SNF Anticipated d/c date is: undetermined Patient currently is medically ready to d/c, but no safe disposition plan.   Subjective and Interval History:  Pt admitted to being forgetful but understood her current situation.  Pt said she wants to go home but acknowledged that she had given guardianship to her sister who will make decision for her.   Objective: Vitals:   09/28/21 0845 09/28/21 1127 09/28/21 1537 09/28/21 1935  BP: (!) 107/54 101/62 118/73 124/87  Pulse: 62 69 82 83  Resp: 16 16 18 19   Temp: 97.7 F (36.5 C) 98.6 F (37 C) 98.2 F (36.8 C) 98.2 F (36.8 C)  TempSrc:      SpO2: 99% 93% 95% 97%  Weight:      Height:        Intake/Output Summary (Last 24 hours) at 09/28/2021 2028 Last data filed at 09/28/2021 0600 Gross per 24 hour  Intake 500 ml  Output --  Net 500 ml   Filed Weights   09/24/21 0532 09/27/21 1102  Weight: 98.1 kg 98.1 kg    Examination:   Constitutional: NAD, alert and oriented, coherent HEENT: conjunctivae and lids normal, EOMI CV: No cyanosis.   RESP: normal respiratory effort, on RA Neuro: II - XII grossly intact.   Psych: depressed mood and affect.     Data Reviewed: I have personally reviewed following labs and imaging studies  CBC: Recent  Labs  Lab 09/23/21 1055 09/23/21 2029 09/24/21 0507 09/25/21 0621 09/26/21 0651  WBC 8.1 7.6 7.9 9.4 8.0  NEUTROABS 5.3  --  4.9  --   --   HGB 16.3* 16.3* 15.5* 15.2* 15.6*  HCT 51.2* 51.1* 47.8* 46.6* 48.5*  MCV 90.5 90.8 89.5 87.9 89.3  PLT 238 227 229 242 660   Basic Metabolic Panel: Recent Labs  Lab 09/23/21 1055 09/23/21 2029 09/24/21 0507  NA 137  --  138  K 4.9  --  4.7  CL 101  --  102  CO2 27  --  26  GLUCOSE 258*  --  166*  BUN 31*  --  32*  CREATININE 0.98 1.08* 0.96  CALCIUM 9.6  --  9.5   GFR: Estimated  Creatinine Clearance: 60 mL/min (by C-G formula based on SCr of 0.96 mg/dL). Liver Function Tests: Recent Labs  Lab 09/23/21 1055 09/24/21 0507  AST 27 27  ALT 24 22  ALKPHOS 113 103  BILITOT 0.5 0.4  PROT 7.4 6.8  ALBUMIN 3.1* 3.0*   Recent Labs  Lab 09/23/21 1055  LIPASE 33   Recent Labs  Lab 09/23/21 2029  AMMONIA 11   Coagulation Profile: Recent Labs  Lab 09/24/21 1417  INR 1.1   Cardiac Enzymes: Recent Labs  Lab 09/23/21 2029  CKTOTAL 242*   BNP (last 3 results) No results for input(s): PROBNP in the last 8760 hours. HbA1C: No results for input(s): HGBA1C in the last 72 hours. CBG: Recent Labs  Lab 09/27/21 1607 09/27/21 2004 09/28/21 0846 09/28/21 1129 09/28/21 1716  GLUCAP 193* 141* 141* 203* 130*   Lipid Profile: No results for input(s): CHOL, HDL, LDLCALC, TRIG, CHOLHDL, LDLDIRECT in the last 72 hours. Thyroid Function Tests: No results for input(s): TSH, T4TOTAL, FREET4, T3FREE, THYROIDAB in the last 72 hours. Anemia Panel: No results for input(s): VITAMINB12, FOLATE, FERRITIN, TIBC, IRON, RETICCTPCT in the last 72 hours. Sepsis Labs: Recent Labs  Lab 09/23/21 1155  LATICACIDVEN 1.5    Recent Results (from the past 240 hour(s))  Resp Panel by RT-PCR (Flu A&B, Covid) Nasopharyngeal Swab     Status: None   Collection Time: 09/23/21 11:42 AM   Specimen: Nasopharyngeal Swab; Nasopharyngeal(NP) swabs in vial transport medium  Result Value Ref Range Status   SARS Coronavirus 2 by RT PCR NEGATIVE NEGATIVE Final    Comment: (NOTE) SARS-CoV-2 target nucleic acids are NOT DETECTED.  The SARS-CoV-2 RNA is generally detectable in upper respiratory specimens during the acute phase of infection. The lowest concentration of SARS-CoV-2 viral copies this assay can detect is 138 copies/mL. A negative result does not preclude SARS-Cov-2 infection and should not be used as the sole basis for treatment or other patient management decisions. A negative  result may occur with  improper specimen collection/handling, submission of specimen other than nasopharyngeal swab, presence of viral mutation(s) within the areas targeted by this assay, and inadequate number of viral copies(<138 copies/mL). A negative result must be combined with clinical observations, patient history, and epidemiological information. The expected result is Negative.  Fact Sheet for Patients:  EntrepreneurPulse.com.au  Fact Sheet for Healthcare Providers:  IncredibleEmployment.be  This test is no t yet approved or cleared by the [place] FDA and  has been authorized for detection and/or diagnosis of SARS-CoV-2 by FDA under an Emergency Use Authorization (EUA). This EUA will remain  in effect (meaning this test can be used) for the duration of the COVID-19 declaration under Section 564(b)(1) of  the Act, 21 U.S.C.section 360bbb-3(b)(1), unless the authorization is terminated  or revoked sooner.       Influenza A by PCR NEGATIVE NEGATIVE Final   Influenza B by PCR NEGATIVE NEGATIVE Final    Comment: (NOTE) The Xpert Xpress SARS-CoV-2/FLU/RSV plus assay is intended as an aid in the diagnosis of influenza from Nasopharyngeal swab specimens and should not be used as a sole basis for treatment. Nasal washings and aspirates are unacceptable for Xpert Xpress SARS-CoV-2/FLU/RSV testing.  Fact Sheet for Patients: EntrepreneurPulse.com.au  Fact Sheet for Healthcare Providers: IncredibleEmployment.be  This test is not yet approved or cleared by the [place] FDA and has been authorized for detection and/or diagnosis of SARS-CoV-2 by FDA under an Emergency Use Authorization (EUA). This EUA will remain in effect (meaning this test can be used) for the duration of the COVID-19 declaration under Section 564(b)(1) of the Act, 21 U.S.C. section 360bbb-3(b)(1), unless the authorization is  terminated or revoked.  Performed at [facility], [address] [place], [place] [address]   Urine Culture     Status: Abnormal   Collection Time: 09/23/21 11:51 PM   Specimen: Urine, Catheterized  Result Value Ref Range Status   Specimen Description   Final    URINE, CATHETERIZED Performed at [facility], [address] [place], [place] [address]    Special Requests   Final    NONE Performed at [facility], [place], [place] [address]    Culture >=100,000 COLONIES/mL KLEBSIELLA PNEUMONIAE (A)  Final   Report Status 09/26/2021 FINAL  Final   Organism ID, Bacteria KLEBSIELLA PNEUMONIAE (A)  Final      Susceptibility   Klebsiella pneumoniae - MIC*    AMPICILLIN RESISTANT Resistant     CEFAZOLIN <=4 SENSITIVE Sensitive     CEFEPIME <=0.12 SENSITIVE Sensitive     CEFTRIAXONE <=0.25 SENSITIVE Sensitive     CIPROFLOXACIN <=0.25 SENSITIVE Sensitive     GENTAMICIN <=1 SENSITIVE Sensitive     IMIPENEM <=0.25 SENSITIVE Sensitive     NITROFURANTOIN 64 INTERMEDIATE Intermediate     TRIMETH/SULFA <=20 SENSITIVE Sensitive     AMPICILLIN/SULBACTAM 4 SENSITIVE Sensitive     PIP/TAZO <=4 SENSITIVE Sensitive     * >=100,000 COLONIES/mL KLEBSIELLA PNEUMONIAE      Radiology Studies: No results found.   Scheduled Meds:  apixaban  10 mg Oral BID   Followed by   Derrill Memo ON 10/02/2021] apixaban  5 mg Oral BID   ARIPiprazole  5 mg Oral Daily   busPIRone  10 mg Oral TID   donepezil  10 mg Oral QHS   gabapentin  300 mg Oral QHS   insulin aspart  0-5 Units Subcutaneous QHS   insulin aspart  0-9 Units Subcutaneous TID WC   insulin glargine-yfgn  15 Units Subcutaneous QHS   LORazepam  0.5 mg Oral Q8H   Continuous Infusions:   LOS: 4 days     Enzo Bi, MD [facility] If 7PM-7AM, please contact night-coverage 09/28/2021, 8:28 PM

## 2021-09-28 NOTE — Progress Notes (Signed)
Physical Therapy Treatment Patient Details Name: Judith Shaw MRN: [id number] DOB: 10/31/1950 Today's Date: 09/28/2021   History of Present Illness Pt is a 71 y/o F admitted on 09/23/21 who presented to the ED from SNF after pt had a brief episode of LOC. MRI was negative for any acute changes. Pt with elevated d-dimer, CT angiogram chest showed bilateral PE. Pt was started on IV heparin. PMH: DM2, HLD, depression, sleep apnea noncompliant with CPAP, hysterectomy for uterine CA. MD cleared pt for participation in therapy prior to completing 48 hrs of anticoagulation.    PT Comments    PT/OT co-treat 2/2 to pt's limited activity tolerance and need for +2 assistance for all mobility/transfers. Pt has yet to be OOB this admission. Elected to [employer] transfer pt OOB to recliner prior to PT/OT session. She was able to roll L/R with increased time and max assistance + vcs. Author questions pt's effort throughout session. She is lethargic and remains lethargic throughout most of session.  Education and constant encouragement required throughout session. She did attempt standing from recliner 2 x with max assist +2. Poor standing tolerance. Returned to recliner and repositioned for safety. Author left number on board for assisting pt back to bed later this date. Encouraged more activity and effort for staying awake/alert throughout the day. Will need extensive assistance going forward for all ADLs.    Recommendations for follow up therapy are one component of a multi-disciplinary discharge planning process, led by the attending physician.  Recommendations may be updated based on patient status, additional functional criteria and insurance authorization.  Follow Up Recommendations  Skilled nursing-short term rehab (<3 hours/day)     Assistance Recommended at Discharge Frequent or constant Supervision/Assistance  Patient can return home with the following Two people to help with walking and/or transfers;Two  people to help with bathing/dressing/bathroom;Direct supervision/assist for medications management;Help with stairs or ramp for entrance;Assist for transportation;Direct supervision/assist for financial management;Assistance with cooking/housework   Equipment Recommendations  None recommended by PT       Precautions / Restrictions Precautions Precautions: Fall Restrictions Weight Bearing Restrictions: No     Mobility  Bed Mobility Overal bed mobility: Needs Assistance   General bed mobility comments: pt required max assist to roll L/R to apply hoyer lift pad. she was hoyer lift transferred to recliner prior to attempting standing trials from recliner. needs constant encouragement for full participation throughout.    Transfers Overall transfer level: Needs assistance Equipment used: Rolling walker (2 wheels) Transfers: Sit to/from Stand Sit to Stand: +2 safety/equipment, +2 physical assistance, Max assist, Total assist           General transfer comment: Author question's pt's effort throughout session. she did attempt standing 2 x and was able to stand with max assist + 2 and max vcs for encouragement. Pt is vry lethargic throughout session. she states several times sh just wants to go back to sleep.  Will need extensive PT going forward to improve quality of life while maximizining independenence with ADLs.    Ambulation/Gait  General Gait Details: unable/unsafe at this time.   Balance Overall balance assessment: Needs assistance Sitting-balance support: Bilateral upper extremity supported, Feet supported Sitting balance-Leahy Scale: Poor     Standing balance support: Bilateral upper extremity supported, During functional activity, Reliant on assistive device for balance Standing balance-Leahy Scale: Zero       Cognition Arousal/Alertness: Awake/alert Behavior During Therapy: Anxious Overall Cognitive Status: No family/caregiver present to determine baseline cognitive  functioning  General Comments: A&Ox2. Hx of dementia, no caregiver to determine baseline cognition.               Pertinent Vitals/Pain Pain Assessment Pain Location: R shoulder Pain Descriptors / Indicators: Discomfort Pain Intervention(s): Limited activity within patient's tolerance, Premedicated before session, Monitored during session     PT Goals (current goals can now be found in the care plan section) Acute Rehab PT Goals Patient Stated Goal: none stated Progress towards PT goals: Progressing toward goals    Frequency    Min 2X/week      PT Plan Current plan remains appropriate    Co-evaluation PT/OT/SLP Co-Evaluation/Treatment: Yes Reason for Co-Treatment: Complexity of the patient's impairments (multi-system involvement);Necessary to address cognition/behavior during functional activity;For patient/therapist safety;To address functional/ADL transfers PT goals addressed during session: Mobility/safety with mobility;Balance;Proper use of DME;Strengthening/ROM        AM-PAC PT "6 Clicks" Mobility   Outcome Measure  Help needed turning from your back to your side while in a flat bed without using bedrails?: Total Help needed moving from lying on your back to sitting on the side of a flat bed without using bedrails?: Total Help needed moving to and from a bed to a chair (including a wheelchair)?: Total Help needed standing up from a chair using your arms (e.g., wheelchair or bedside chair)?: Total Help needed to walk in hospital room?: Total Help needed climbing 3-5 steps with a railing? : Total 6 Click Score: 6    End of Session Equipment Utilized During Treatment: Gait belt Activity Tolerance: Patient limited by lethargy Patient left: in chair;with call bell/phone within reach;with chair alarm set Nurse Communication: Mobility status PT Visit Diagnosis: Unsteadiness on feet (R26.81);Muscle weakness (generalized) (M62.81);Difficulty in walking, not  elsewhere classified (R26.2)     Time: 1000-1042 PT Time Calculation (min) (ACUTE ONLY): 42 min  Charges:  $Therapeutic Activity: 23-37 mins                     Julaine Fusi PTA 09/28/21, 1:33 PM

## 2021-09-28 NOTE — Progress Notes (Signed)
Occupational Therapy Treatment Patient Details Name: Judith Shaw MRN: [id number] DOB: May 16, 1951 Today's Date: 09/28/2021   History of present illness Pt is a 71 y/o F admitted on 09/23/21 who presented to the ED from SNF after pt had a brief episode of LOC. MRI was negative for any acute changes. Pt with elevated d-dimer, CT angiogram chest showed bilateral PE. Pt was started on IV heparin. PMH: DM2, HLD, depression, sleep apnea noncompliant with CPAP, hysterectomy for uterine CA.   OT comments  Pt seen for OT/PT co-treatment on this date. Upon arrival to room, pt rolling in bed with PT present. Pt oriented to self and place, however more lethargic this date. Following moderate encouragement to attempt OOB mobility, pt agreeable. Pt required MAX A for rolling in bed to place hoyer lift pad and transferred bed>chair via mechanical lift. Once seated in recliner, pt attempted x2 sit>stand transfers, requiring MAX A+2 to clear hips from chair, but unable to achieve upright posture. Once seated back in recliner, pt required MOD A for seated grooming tasks and SET-UP assist for eating breakfast. Pt is making good progress toward goals and continues to benefit from skilled OT services to maximize return to PLOF and minimize risk of future falls, injury, caregiver burden, and readmission. Will continue to follow POC. Discharge recommendation remains appropriate.     Recommendations for follow up therapy are one component of a multi-disciplinary discharge planning process, led by the attending physician.  Recommendations may be updated based on patient status, additional functional criteria and insurance authorization.    Follow Up Recommendations  Skilled nursing-short term rehab (<3 hours/day)    Assistance Recommended at Discharge Frequent or constant Supervision/Assistance  Patient can return home with the following  Two people to help with walking and/or transfers;Two people to help with  bathing/dressing/bathroom   Equipment Recommendations  Other (comment) (defer to next venue of care)       Precautions / Restrictions Precautions Precautions: Fall Restrictions Weight Bearing Restrictions: No       Mobility Bed Mobility Overal bed mobility: Needs Assistance Bed Mobility: Rolling Rolling: Max assist         General bed mobility comments: pt required max assist to roll L/R to apply hoyer lift pad. she was hoyer lift transferred to bed>recliner prior to attempting standing trials from recliner. needs constant encouragement for full participation throughout.    Transfers Overall transfer level: Needs assistance Equipment used: Rolling walker (2 wheels) Transfers: Sit to/from Stand Sit to Stand: +2 safety/equipment, +2 physical assistance, Max assist, Total assist                 Balance Overall balance assessment: Needs assistance Sitting-balance support: Bilateral upper extremity supported, Feet supported Sitting balance-Leahy Scale: Poor     Standing balance support: Bilateral upper extremity supported, During functional activity, Reliant on assistive device for balance Standing balance-Leahy Scale: Zero                             ADL either performed or assessed with clinical judgement   ADL Overall ADL's : Needs assistance/impaired Eating/Feeding: Set up;Sitting Eating/Feeding Details (indicate cue type and reason): requires assist to open food containers d/t decreased sensation in fingers Grooming: Brushing hair;Moderate assistance;Sitting Grooming Details (indicate cue type and reason): Pt able to comb ~60% of head; requires assist to comb back of head.  Cognition Arousal/Alertness: Awake/alert, Lethargic Behavior During Therapy: Anxious Overall Cognitive Status: No family/caregiver present to determine baseline cognitive functioning                                  General Comments: A&Ox2. Hx of dementia, no caregiver to determine baseline cognition. Pt very lethargic this date                   Pertinent Vitals/ Pain       Pain Assessment Pain Assessment: Faces Faces Pain Scale: Hurts a little bit Pain Location: R shoulder Pain Descriptors / Indicators: Discomfort Pain Intervention(s): Limited activity within patient's tolerance, Monitored during session, Repositioned         Frequency  Min 2X/week        Progress Toward Goals  OT Goals(current goals can now be found in the care plan section)  Progress towards OT goals: Progressing toward goals  Acute Rehab OT Goals Patient Stated Goal: to not fall OT Goal Formulation: With patient Time For Goal Achievement: 10/10/21 Potential to Achieve Goals: [place] Discharge plan remains appropriate;Frequency remains appropriate    Co-evaluation    PT/OT/SLP Co-Evaluation/Treatment: Yes Reason for Co-Treatment: Complexity of the patient's impairments (multi-system involvement);Necessary to address cognition/behavior during functional activity;For patient/therapist safety;To address functional/ADL transfers PT goals addressed during session: Mobility/safety with mobility OT goals addressed during session: ADL's and self-care      AM-PAC OT "6 Clicks" Daily Activity     Outcome Measure   Help from another person eating meals?: None Help from another person taking care of personal grooming?: A Lot Help from another person toileting, which includes using toliet, bedpan, or urinal?: Total Help from another person bathing (including washing, rinsing, drying)?: A Lot Help from another person to put on and taking off regular upper body clothing?: A Little Help from another person to put on and taking off regular lower body clothing?: A Lot 6 Click Score: 14    End of Session Equipment Utilized During Treatment: Rolling walker (2 wheels)  OT Visit Diagnosis: Unsteadiness on  feet (R26.81);Muscle weakness (generalized) (M62.81);Other symptoms and signs involving cognitive function   Activity Tolerance Patient limited by lethargy   Patient Left in chair;with call bell/phone within reach;with chair alarm set   Nurse Communication          Time: 0867-6195 OT Time Calculation (min): 34 min  Charges: OT General Charges $OT Visit: 1 Visit OT Treatments $Self Care/Home Management : 8-22 mins  Fredirick Maudlin, OTR/L [place]

## 2021-09-28 NOTE — Progress Notes (Signed)
°  Chaplain On-Call attempted to visit with the patient, in seeking to complete [place] from yesterday.  Patient was lethargic and declined to speak with this Chaplain.  Chaplain marked Spiritual Care Consult Order as "complete".  Chaplain Pollyann Samples M.Div., [facility]

## 2021-09-29 DIAGNOSIS — G9340 Encephalopathy, unspecified: Secondary | ICD-10-CM | POA: Diagnosis not present

## 2021-09-29 LAB — CBC
HCT: 46.3 % — ABNORMAL HIGH (ref 36.0–46.0)
Hemoglobin: 15.6 g/dL — ABNORMAL HIGH (ref 12.0–15.0)
MCH: 29.4 pg (ref 26.0–34.0)
MCHC: 33.7 g/dL (ref 30.0–36.0)
MCV: 87.2 fL (ref 80.0–100.0)
Platelets: 252 10*3/uL (ref 150–400)
RBC: 5.31 MIL/uL — ABNORMAL HIGH (ref 3.87–5.11)
RDW: 13.6 % (ref 11.5–15.5)
WBC: 8.4 10*3/uL (ref 4.0–10.5)
nRBC: 0 % (ref 0.0–0.2)

## 2021-09-29 LAB — BASIC METABOLIC PANEL
Anion gap: 8 (ref 5–15)
BUN: 37 mg/dL — ABNORMAL HIGH (ref 8–23)
CO2: 22 mmol/L (ref 22–32)
Calcium: 9 mg/dL (ref 8.9–10.3)
Chloride: 105 mmol/L (ref 98–111)
Creatinine, Ser: 1.06 mg/dL — ABNORMAL HIGH (ref 0.44–1.00)
GFR, Estimated: 56 mL/min — ABNORMAL LOW (ref >60)
Glucose, Bld: 145 mg/dL — ABNORMAL HIGH (ref 70–99)
Potassium: 4.3 mmol/L (ref 3.5–5.1)
Sodium: 135 mmol/L (ref 135–145)

## 2021-09-29 LAB — GLUCOSE, CAPILLARY
Glucose-Capillary: 137 mg/dL — ABNORMAL HIGH (ref 70–99)
Glucose-Capillary: 183 mg/dL — ABNORMAL HIGH (ref 70–99)
Glucose-Capillary: 168 mg/dL — ABNORMAL HIGH (ref 70–99)
Glucose-Capillary: 176 mg/dL — ABNORMAL HIGH (ref 70–99)

## 2021-09-29 LAB — MAGNESIUM: Magnesium: 1.5 mg/dL — ABNORMAL LOW (ref 1.7–2.4)

## 2021-09-29 MED ORDER — MAGNESIUM SULFATE 4 GM/100ML IV SOLN
4.0000 g | Freq: Once | INTRAVENOUS | Status: AC
  Administered 2021-09-29: 15:00:00 4 g via INTRAVENOUS
  Filled 2021-09-29: qty 100

## 2021-09-29 NOTE — Progress Notes (Signed)
Patient yelling out, requesting to be put back in bed. Patient was just placed in the chair via [place] by PT less than 20 minutes prior. Patient educated on need to stay OOB for at least an hour. Patient also reminded of acceptable behaviors (using call light) vs unacceptable behaviors (yelling out).

## 2021-09-29 NOTE — Progress Notes (Signed)
Patient continues to yell out despite repeated attempts at education and re-education on call light use and rounding schedule. Patient is able to use call light independently, but chooses not to.

## 2021-09-29 NOTE — TOC Progression Note (Signed)
Transition of Care [facility]) - Progression Note    Patient Details  Name: Judith Shaw MRN: [id number] Date of Birth: [birth year]  Transition of Care [facility]) CM/SW [place], RN Phone Number: 09/29/2021, 4:30 PM  Clinical Narrative: Patient's sister states she would like LTC.  She does not have a payor, as sister states she does not qualify for medicaid.  RNCM explained that if self pay, family would have to provide financial information to secure payment to facility, and RNCM does not have those details, facility would advise on what details they would need.    Sister stated she wanted [facility], and then stated she wanted a facility in [place].  Sister contacted [facility] to inquire, but stated she remains unsure of Long term or SNF and did not want to provide information for guarantee of payment.  Magda Paganini at [employer] she will review information and try to contact sister with determination of a bed offer.  Sister states patient cannot return home, so that is not an option.  She also states that she did not have a plan for discharge, as she is not sure what to do.  MD spoke with patient as well about discharge planning.      Expected Discharge Plan: [place] Barriers to Discharge: Continued Medical Work up  Expected Discharge Plan and Services Expected Discharge Plan: [place] Choice: [place] (LTAC) Living arrangements for the past 2 months: [place]                                       Social Determinants of Health (SDOH) Interventions    Readmission Risk Interventions Readmission Risk Prevention Plan 08/12/2021  Transportation Screening Complete  PCP or Specialist Appt within 3-5 Days Complete  HRI or [place] Complete  Social Work Consult for [place] Planning/Counseling Complete  Palliative Care Screening Not Applicable  Medication Review  Press photographer) Complete  Some recent data might be hidden

## 2021-09-29 NOTE — Progress Notes (Signed)
Physical Therapy Treatment Patient Details Name: Judith Shaw MRN: [id number] DOB: 1951/03/05 Today's Date: 09/29/2021   History of Present Illness Pt is a 71 y/o F admitted on 09/23/21 who presented to the ED from SNF after pt had a brief episode of LOC. MRI was negative for any acute changes. Pt with elevated d-dimer, CT angiogram chest showed bilateral PE. Pt was started on IV heparin. PMH: DM2, HLD, depression, sleep apnea noncompliant with CPAP, hysterectomy for uterine CA.    PT Comments    PT/OT co-treat for pt and therapist safety. +2 assistance required for all mobility and transfers. Poor standing abilities even with +2 assistance. Pt unable to fully clear hips even with +2 max assistance. Hoyer lift transfers most appropriate at this time. Pt has been at a SNF in recent past and per caregiver, " not much progress was made." Author feels pt is more appropriate for LTC going forward. PT will continue to follow and progress as able per current POC.    Recommendations for follow up therapy are one component of a multi-disciplinary discharge planning process, led by the attending physician.  Recommendations may be updated based on patient status, additional functional criteria and insurance authorization.  Follow Up Recommendations  Skilled nursing-short term rehab (<3 hours/day)     Assistance Recommended at Discharge Frequent or constant Supervision/Assistance  Patient can return home with the following Two people to help with walking and/or transfers;Two people to help with bathing/dressing/bathroom;Direct supervision/assist for medications management;Help with stairs or ramp for entrance;Assist for transportation;Direct supervision/assist for financial management;Assistance with cooking/housework   Equipment Recommendations  None recommended by PT       Precautions / Restrictions Precautions Precautions: Fall Restrictions Weight Bearing Restrictions: No     Mobility  Bed  Mobility Overal bed mobility: Needs Assistance Bed Mobility: Rolling Rolling: Max assist   Supine to sit: Max assist, +2 for physical assistance, HOB elevated Sit to supine: Max assist, +2 for physical assistance   General bed mobility comments: Pt requires +2 assistance for all mobility    Transfers Overall transfer level: Needs assistance Equipment used: Rolling walker (2 wheels) Transfers: Sit to/from Stand Sit to Stand: Total assist, +2 physical assistance, +2 safety/equipment, From elevated surface     General transfer comment: Pt was able to stand (poor posture and minimal hip clearance EOB ) 3 x. Pt has severe fear of falling however also presents with severe weakness. Per personal caregiver, pt has not been able to stand for " more than a few months" after standing trials, author assisted pt to recliner via hoyer lift. RN tech in room for education to assist pt back to bed later in the day. Transfer via Geophysicist/field seismologist:  Product manager)  Ambulation/Gait   General Gait Details: unable/unsafe     Balance Overall balance assessment: Needs assistance Sitting-balance support: Bilateral upper extremity supported, Feet supported Sitting balance-Leahy Scale: Poor     Standing balance support: Bilateral upper extremity supported, During functional activity, Reliant on assistive device for balance Standing balance-Leahy Scale: Zero       Cognition Arousal/Alertness: Awake/alert Behavior During Therapy: Anxious, WFL for tasks assessed/performed Overall Cognitive Status: No family/caregiver present to determine baseline cognitive functioning      General Comments: Pt has baseline cognition deficits per support caregiver and per POA (sister)          PT Goals (current goals can now be found in the care plan section) Acute Rehab PT Goals Patient Stated Goal: none stated Progress  towards PT goals: Progressing toward goals    Frequency    Min 2X/week      PT Plan Current  plan remains appropriate    Co-evaluation     PT goals addressed during session: Mobility/safety with mobility;Balance;Proper use of DME;Strengthening/ROM        AM-PAC PT "6 Clicks" Mobility   Outcome Measure  Help needed turning from your back to your side while in a flat bed without using bedrails?: Total Help needed moving from lying on your back to sitting on the side of a flat bed without using bedrails?: Total Help needed moving to and from a bed to a chair (including a wheelchair)?: Total Help needed standing up from a chair using your arms (e.g., wheelchair or bedside chair)?: Total Help needed to walk in hospital room?: Total Help needed climbing 3-5 steps with a railing? : Total 6 Click Score: 6    End of Session Equipment Utilized During Treatment: Gait belt Activity Tolerance: Other (comment) (Pt is limited by severe weakness/ anxiety/ and overall poor cognition) Patient left: in chair;with call bell/phone within reach;with chair alarm set;with family/visitor present (personal private caregiver present) Nurse Communication: Mobility status PT Visit Diagnosis: Unsteadiness on feet (R26.81);Muscle weakness (generalized) (M62.81);Difficulty in walking, not elsewhere classified (R26.2)     Time: 6861-6837 PT Time Calculation (min) (ACUTE ONLY): 23 min  Charges:  $Therapeutic Activity: 8-22 mins                     Julaine Fusi PTA 09/29/21, 5:46 PM

## 2021-09-29 NOTE — Progress Notes (Signed)
Patient and patient's personal care aide at beside refusing to allow patient to be put back to bed at this time, despite patient repeatedly yelling out to be put back to bed earlier.

## 2021-09-29 NOTE — NC FL2 (Signed)
[place] LEVEL OF CARE SCREENING TOOL     IDENTIFICATION  Patient Name: Judith Shaw Birthdate: Mar 29, 1951 Sex: female Admission Date (Current Location): 09/23/2021  [facility] and [place] Number:  Engineering geologist and Address:  [facility], [address] [place], [place] [address]      Provider Number: [id number]  Attending Physician Name and Address:  Enzo Bi, MD  Relative Name and Phone Number:       Current Level of Care: Hospital Recommended Level of Care: [place] Prior Approval Number:    Date Approved/Denied:   PASRR Number:    Discharge Plan: SNF    Current Diagnoses: Patient Active Problem List   Diagnosis Date Noted   Pressure injury of skin 09/24/2021   Bilateral pulmonary embolism ([place]) 09/24/2021   Syncope 09/23/2021   Lethargy 09/23/2021   Acute encephalopathy 09/23/2021   History of insulin dependent diabetes mellitus    Aspiration pneumonia of lower lobe (HCC)    Lethargic    Encephalopathy 42/70/6237   Acute metabolic encephalopathy 62/83/1517   Acute pyelonephritis 07/28/2021   Severe sepsis ([place]) 07/28/2021   Severe sepsis with acute organ dysfunction ([place]) 07/27/2021   Atherosclerosis of native arteries of the extremities with ulceration ([place]) 02/24/2020   S/P amputation of lesser toe, left ([place]) 01/13/2020   Dementia without behavioral disturbance ([place]) 12/16/2019   Acute osteomyelitis of left foot ([place]) 12/15/2019   Memory impairment 11/30/2019   Complaints of memory disturbance 11/20/2019   Diabetic ulcer of left midfoot associated with type 2 diabetes mellitus ([place]) 11/18/2019   Seizure-like activity ([place]) 07/15/2019   Intractable 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 06/19/2019   Uncontrolled type 2 diabetes mellitus with hyperglycemia, with long-term current use of insulin (HCC) 04/22/2019   Panic attacks 03/26/2019   Benign essential HTN 07/19/2018    Fatty liver 05/15/2018   Hx of adenomatous colonic polyps 05/15/2018   Type 2 diabetes mellitus with diabetic nephropathy, with long-term current use of insulin ([place]) 05/01/2018   Tinnitus, bilateral 04/05/2017   Concussion syndrome 04/03/2017   Concussion without loss of consciousness 01/24/2017   Difficulty walking 01/24/2017   Headache disorder 01/24/2017   Numbness and tingling 01/24/2017   Postural urinary incontinence 01/24/2017   Sepsis ([place]) 09/21/2016   UTI (urinary tract infection) 09/21/2016   HTN (hypertension) 09/21/2016   Diabetes ([place]) 09/21/2016   Depression 09/21/2016   Health care maintenance 10/05/2015   Recurrent major depressive disorder, in full remission ([place]) 06/16/2014   DM (diabetes mellitus) type II controlled, neurological manifestation ([place]) 06/12/2014   Morbid obesity ([place]) 06/12/2014   Hyperlipidemia 03/10/2014   Chronic diastolic CHF (congestive heart failure) ([place]) 61/60/7371   H/O diastolic dysfunction 02/21/9484   Sleep apnea 03/10/2014   SOB (shortness of breath) 03/10/2014    Orientation RESPIRATION BLADDER Height & Weight     Self, Place  Normal Incontinent Weight: 216 lb 4.3 oz (98.1 kg) Height:  5\' 3"  (160 cm)  BEHAVIORAL SYMPTOMS/MOOD NEUROLOGICAL BOWEL NUTRITION STATUS      Incontinent Diet (heart healthy/carb modified, thin liquids)  AMBULATORY STATUS COMMUNICATION OF NEEDS Skin   Extensive Assist Verbally PU Stage and Appropriate Care   PU Stage 2 Dressing:  (stage 2 located on coccyx, foam dressig, change PRN)                   Personal Care Assistance Level of Assistance  Feeding, Bathing, Dressing Bathing Assistance: Maximum assistance Feeding  assistance: Independent Dressing Assistance: Maximum assistance     Functional Limitations Info  Sight, Hearing, Speech Sight Info: Adequate Hearing Info: Adequate Speech Info: Adequate    SPECIAL CARE FACTORS FREQUENCY  PT (By licensed PT), OT (By licensed OT)     PT  Frequency: 5x OT Frequency: 5x            Contractures Contractures Info: Not present    Additional Factors Info  Code Status, Allergies Code Status Info: Full Code Allergies Info: Codeine           Current Medications (09/29/2021):  This is the current hospital active medication list Current Facility-Administered Medications  Medication Dose Route Frequency Provider Last Rate Last Admin   acetaminophen (TYLENOL) tablet 650 mg  650 mg Oral Q6H PRN Rise Patience, MD   650 mg at 09/26/21 1901   Or   acetaminophen (TYLENOL) suppository 650 mg  650 mg Rectal Q6H PRN Rise Patience, MD       apixaban Arne Cleveland) tablet 10 mg  10 mg Oral BID Benita Gutter, RPH   10 mg at 09/29/21 8119   Followed by   Derrill Memo ON 10/02/2021] apixaban (ELIQUIS) tablet 5 mg  5 mg Oral BID Benita Gutter, RPH       ARIPiprazole (ABILIFY) tablet 5 mg  5 mg Oral Daily Sharen Hones, MD   5 mg at 09/29/21 1478   busPIRone (BUSPAR) tablet 10 mg  10 mg Oral TID Sharen Hones, MD   10 mg at 09/29/21 1430   donepezil (ARICEPT) tablet 10 mg  10 mg Oral QHS Sharen Hones, MD   10 mg at 09/28/21 2222   gabapentin (NEURONTIN) capsule 300 mg  300 mg Oral QHS Sharen Hones, MD   300 mg at 09/28/21 2222   hydrALAZINE (APRESOLINE) injection 5 mg  5 mg Intravenous Q4H PRN Rise Patience, MD       insulin aspart (novoLOG) injection 0-5 Units  0-5 Units Subcutaneous QHS Sharen Hones, MD       insulin aspart (novoLOG) injection 0-9 Units  0-9 Units Subcutaneous TID WC Sharen Hones, MD   1 Units at 09/28/21 1801   insulin glargine-yfgn (SEMGLEE) injection 15 Units  15 Units Subcutaneous QHS Rise Patience, MD   15 Units at 09/28/21 2221   loperamide (IMODIUM) capsule 2 mg  2 mg Oral PRN Sharen Hones, MD   2 mg at 09/28/21 0159   LORazepam (ATIVAN) tablet 0.5 mg  0.5 mg Oral Q8H Zhang, Danford Bad, MD   0.5 mg at 09/29/21 1430   magnesium sulfate IVPB 4 g 100 mL  4 g Intravenous Once Enzo Bi, MD 50 mL/hr at  09/29/21 1435 4 g at 09/29/21 1435     Discharge Medications: Please see discharge summary for a list of discharge medications.  Relevant Imaging Results:  Relevant Lab Results:   Additional Information [id number]  Gerrianne Scale Adelee Hannula, LCSW

## 2021-09-29 NOTE — Progress Notes (Signed)
PROGRESS NOTE    Judith Shaw  IOE:[id number] DOB: [birth year] DOA: 09/23/2021 PCP: Pcp, No  112A/112A-AA   Assessment & Plan:   Principal Problem:   Acute encephalopathy Active Problems:   Diabetes ([place])   Chronic diastolic CHF (congestive heart failure) (HCC)   Benign essential HTN   Dementia without behavioral disturbance ([place])   Syncope   Lethargy   Pressure injury of skin   Bilateral pulmonary embolism (HCC)   Judith Shaw is a 71 y.o. female with history of diabetes mellitus type 2, hyperlipidemia, depression, sleep apnea noncompliant with CPAP prior history of hysterectomy for uterine CA was brought to the ER from skilled nursing facility after patient had a brief episode of loss of consciousness. MRI brain did not show any acute changes. She has elevated D-dimer, CT angiogram chest showed bilateral PE.  She is started on IV heparin.  Switched to Eliquis on 1/29.   Bilateral pulm emboli. --cont Eliquis  Acute metabolic encephalopathy, resolved  Syncope and hypotension --inconsistent report of hypotension at the SNF, however, BP 140's on presentation.   Syncope maybe secondary to PE.  Sister reported prior hospitalizations with symptomatic hypotension, although each time pt was found to have an acute medical condition. --BP has been 100's-150's without BP medication. --monitor BP   Generalized weakness. Frequent falls --TOC working with sister to find SNF placement  Klebsiella pneumonia UTI. --s/p a dose of fosfomycin, treated.   Morbid obesity. Patient with a BMI of 38, with associated chronic diastolic congestive heart failure, hypertension and type 2 diabetes.   Type 2 diabetes. --cont glargine 15u nightly --SSI   Essential hypertension --not on BP med PTA --monitor   Pressure ulcer POA Pressure Injury 09/23/21 Coccyx Stage 2 -  Partial thickness loss of dermis presenting as a shallow open injury with a red, pink wound bed without slough. (Active)   09/23/21 1919  Location: Coccyx  Location Orientation:   Staging: Stage 2 -  Partial thickness loss of dermis presenting as a shallow open injury with a red, pink wound bed without slough.  Wound Description (Comments):   Present on Admission: Yes   --RN to order specialty bed   DVT prophylaxis: HW:[id number] Code Status: Full code  Family Communication: sister updated on speaker phone today Level of care: Med-Surg Dispo:   The patient is from: SNF Anticipated d/c is to: PT rec SNF Anticipated d/c date is: undetermined Patient currently is medically ready to d/c, but no safe disposition plan.   Subjective and Interval History:  No new changes.  Pt's brother and prior care giver came to visit her.  I and TOC spoke to sister about disposition plan.   Objective: Vitals:   09/29/21 0501 09/29/21 0709 09/29/21 1141 09/29/21 1554  BP: 118/64 107/60 128/62 (!) 149/79  Pulse: 81 79 83 83  Resp: 19 19 18 18   Temp: 98 F (36.7 C) 97.9 F (36.6 C) 98.3 F (36.8 C) 98.2 F (36.8 C)  TempSrc:  Oral Oral   SpO2: 95% 95% 97% 98%  Weight:      Height:        Intake/Output Summary (Last 24 hours) at 09/29/2021 1944 Last data filed at 09/29/2021 1416 Gross per 24 hour  Intake 320 ml  Output --  Net 320 ml   Filed Weights   09/24/21 0532 09/27/21 1102  Weight: 98.1 kg 98.1 kg    Examination:   Constitutional: NAD, AAOx3 HEENT: conjunctivae and lids normal, EOMI CV: No cyanosis.  RESP: normal respiratory effort, on RA Neuro: II - XII grossly intact.   Psych: depressed mood and affect.     Data Reviewed: I have personally reviewed following labs and imaging studies  CBC: Recent Labs  Lab 09/23/21 1055 09/23/21 2029 09/24/21 0507 09/25/21 0621 09/26/21 0651 09/29/21 0721  WBC 8.1 7.6 7.9 9.4 8.0 8.4  NEUTROABS 5.3  --  4.9  --   --   --   HGB 16.3* 16.3* 15.5* 15.2* 15.6* 15.6*  HCT 51.2* 51.1* 47.8* 46.6* 48.5* 46.3*  MCV 90.5 90.8 89.5 87.9 89.3 87.2  PLT 238  227 229 242 212 858   Basic Metabolic Panel: Recent Labs  Lab 09/23/21 1055 09/23/21 2029 09/24/21 0507 09/29/21 0721  NA 137  --  138 135  K 4.9  --  4.7 4.3  CL 101  --  102 105  CO2 27  --  26 22  GLUCOSE 258*  --  166* 145*  BUN 31*  --  32* 37*  CREATININE 0.98 1.08* 0.96 1.06*  CALCIUM 9.6  --  9.5 9.0  MG  --   --   --  1.5*   GFR: Estimated Creatinine Clearance: 54.3 mL/min (A) (by C-G formula based on SCr of 1.06 mg/dL (H)). Liver Function Tests: Recent Labs  Lab 09/23/21 1055 09/24/21 0507  AST 27 27  ALT 24 22  ALKPHOS 113 103  BILITOT 0.5 0.4  PROT 7.4 6.8  ALBUMIN 3.1* 3.0*   Recent Labs  Lab 09/23/21 1055  LIPASE 33   Recent Labs  Lab 09/23/21 2029  AMMONIA 11   Coagulation Profile: Recent Labs  Lab 09/24/21 1417  INR 1.1   Cardiac Enzymes: Recent Labs  Lab 09/23/21 2029  CKTOTAL 242*   BNP (last 3 results) No results for input(s): PROBNP in the last 8760 hours. HbA1C: No results for input(s): HGBA1C in the last 72 hours. CBG: Recent Labs  Lab 09/28/21 1716 09/28/21 2128 09/29/21 0746 09/29/21 1145 09/29/21 1602  GLUCAP 130* 140* 137* 183* 168*   Lipid Profile: No results for input(s): CHOL, HDL, LDLCALC, TRIG, CHOLHDL, LDLDIRECT in the last 72 hours. Thyroid Function Tests: No results for input(s): TSH, T4TOTAL, FREET4, T3FREE, THYROIDAB in the last 72 hours. Anemia Panel: No results for input(s): VITAMINB12, FOLATE, FERRITIN, TIBC, IRON, RETICCTPCT in the last 72 hours. Sepsis Labs: Recent Labs  Lab 09/23/21 1155  LATICACIDVEN 1.5    Recent Results (from the past 240 hour(s))  Resp Panel by RT-PCR (Flu A&B, Covid) Nasopharyngeal Swab     Status: None   Collection Time: 09/23/21 11:42 AM   Specimen: Nasopharyngeal Swab; Nasopharyngeal(NP) swabs in vial transport medium  Result Value Ref Range Status   SARS Coronavirus 2 by RT PCR NEGATIVE NEGATIVE Final    Comment: (NOTE) SARS-CoV-2 target nucleic acids are NOT  DETECTED.  The SARS-CoV-2 RNA is generally detectable in upper respiratory specimens during the acute phase of infection. The lowest concentration of SARS-CoV-2 viral copies this assay can detect is 138 copies/mL. A negative result does not preclude SARS-Cov-2 infection and should not be used as the sole basis for treatment or other patient management decisions. A negative result may occur with  improper specimen collection/handling, submission of specimen other than nasopharyngeal swab, presence of viral mutation(s) within the areas targeted by this assay, and inadequate number of viral copies(<138 copies/mL). A negative result must be combined with clinical observations, patient history, and epidemiological information. The expected result is Negative.  Fact  Sheet for Patients:  EntrepreneurPulse.com.au  Fact Sheet for Healthcare Providers:  IncredibleEmployment.be  This test is no t yet approved or cleared by the [place] FDA and  has been authorized for detection and/or diagnosis of SARS-CoV-2 by FDA under an Emergency Use Authorization (EUA). This EUA will remain  in effect (meaning this test can be used) for the duration of the COVID-19 declaration under Section 564(b)(1) of the Act, 21 U.S.C.section 360bbb-3(b)(1), unless the authorization is terminated  or revoked sooner.       Influenza A by PCR NEGATIVE NEGATIVE Final   Influenza B by PCR NEGATIVE NEGATIVE Final    Comment: (NOTE) The Xpert Xpress SARS-CoV-2/FLU/RSV plus assay is intended as an aid in the diagnosis of influenza from Nasopharyngeal swab specimens and should not be used as a sole basis for treatment. Nasal washings and aspirates are unacceptable for Xpert Xpress SARS-CoV-2/FLU/RSV testing.  Fact Sheet for Patients: EntrepreneurPulse.com.au  Fact Sheet for Healthcare Providers: IncredibleEmployment.be  This test is not yet  approved or cleared by the [place] FDA and has been authorized for detection and/or diagnosis of SARS-CoV-2 by FDA under an Emergency Use Authorization (EUA). This EUA will remain in effect (meaning this test can be used) for the duration of the COVID-19 declaration under Section 564(b)(1) of the Act, 21 U.S.C. section 360bbb-3(b)(1), unless the authorization is terminated or revoked.  Performed at [facility], [address] [place], [place] [address]   Urine Culture     Status: Abnormal   Collection Time: 09/23/21 11:51 PM   Specimen: Urine, Catheterized  Result Value Ref Range Status   Specimen Description   Final    URINE, CATHETERIZED Performed at [facility], [address] [place], [place] [address]    Special Requests   Final    NONE Performed at [facility], [place], [place] [address]    Culture >=100,000 COLONIES/mL KLEBSIELLA PNEUMONIAE (A)  Final   Report Status 09/26/2021 FINAL  Final   Organism ID, Bacteria KLEBSIELLA PNEUMONIAE (A)  Final      Susceptibility   Klebsiella pneumoniae - MIC*    AMPICILLIN RESISTANT Resistant     CEFAZOLIN <=4 SENSITIVE Sensitive     CEFEPIME <=0.12 SENSITIVE Sensitive     CEFTRIAXONE <=0.25 SENSITIVE Sensitive     CIPROFLOXACIN <=0.25 SENSITIVE Sensitive     GENTAMICIN <=1 SENSITIVE Sensitive     IMIPENEM <=0.25 SENSITIVE Sensitive     NITROFURANTOIN 64 INTERMEDIATE Intermediate     TRIMETH/SULFA <=20 SENSITIVE Sensitive     AMPICILLIN/SULBACTAM 4 SENSITIVE Sensitive     PIP/TAZO <=4 SENSITIVE Sensitive     * >=100,000 COLONIES/mL KLEBSIELLA PNEUMONIAE      Radiology Studies: No results found.   Scheduled Meds:  apixaban  10 mg Oral BID   Followed by   Derrill Memo ON 10/02/2021] apixaban  5 mg Oral BID   ARIPiprazole  5 mg Oral Daily   busPIRone  10 mg Oral TID   donepezil  10 mg Oral QHS   gabapentin  300 mg Oral QHS   insulin aspart  0-5 Units Subcutaneous QHS    insulin aspart  0-9 Units Subcutaneous TID WC   insulin glargine-yfgn  15 Units Subcutaneous QHS   LORazepam  0.5 mg Oral Q8H   Continuous Infusions:   LOS: 5 days     Enzo Bi, MD [facility] If 7PM-7AM, please contact night-coverage 09/29/2021, 7:44 PM

## 2021-09-29 NOTE — Progress Notes (Signed)
Occupational Therapy Treatment Patient Details Name: Judith Shaw MRN: [id number] DOB: December 14, 1950 Today's Date: 09/29/2021   History of present illness Pt is a 71 y/o F admitted on 09/23/21 who presented to the ED from SNF after pt had a brief episode of LOC. MRI was negative for any acute changes. Pt with elevated d-dimer, CT angiogram chest showed bilateral PE. Pt was started on IV heparin. PMH: DM2, HLD, depression, sleep apnea noncompliant with CPAP, hysterectomy for uterine CA.   OT comments  Pt seen for OT/PT co-tx this date to f/u re: safety with ADLs/ADL mobility. Co-tx completed to maximize services. Pt requires MAX A +2 to come to EOB Sitting. She requires increased time and encouragement as well as calming strategies d/t being fearful of falling. 3 STS trials completed including w/ RW and w/ arm in arm technique (knee blocking each time). She can be felt actively participating and attempting to stand for first half of each trial, then seemingly gives up d/t fear and essentially demos no control for descent back to sitting. She is returned to bed and requires MAX A +2 for rolling for peri care, MAX A for actual self-care portion of task. She requires TOTAL A +2 for repositioning. She is left with all needs met and in reach. This patient is well known to this therapist, she presents much the same this hospital stay as last hospital stay. Because she appears to be making limited progress towards goals or fostering improved independence, f/u recommendation updated to LTC. That said, will keep patient on caseload to complete any ADL/ADL mobility-related education with pt/caregivers/family that is required to make this transition safe and effective for all parties involved.    Recommendations for follow up therapy are one component of a multi-disciplinary discharge planning process, led by the attending physician.  Recommendations may be updated based on patient status, additional functional criteria and  insurance authorization.    Follow Up Recommendations  Long-term institutional care without follow-up therapy    Assistance Recommended at Discharge Frequent or constant Supervision/Assistance  Patient can return home with the following  Two people to help with walking and/or transfers;Two people to help with bathing/dressing/bathroom   Equipment Recommendations   (defer)    Recommendations for Other Services      Precautions / Restrictions Precautions Precautions: Fall Restrictions Weight Bearing Restrictions: No       Mobility Bed Mobility Overal bed mobility: Needs Assistance Bed Mobility: Supine to Sit, Sit to Supine     Supine to sit: Max assist, +2 for physical assistance, HOB elevated Sit to supine: Max assist, +2 for physical assistance   General bed mobility comments: Pt requires +2 assistance for all mobility    Transfers   Equipment used: Rolling walker (2 wheels) Transfers: Sit to/from Stand Sit to Stand: Total assist, +2 physical assistance, +2 safety/equipment, From elevated surface           General transfer comment: Pt was able to stand (poor posture and minimal hip clearance EOB ) 3 x. Pt has severe fear of falling however also presents with severe weakness. Per personal caregiver, pt has not been able to stand for " more than a few months" after standing trials, author assisted pt to recliner via hoyer lift. RN tech in room for education to assist pt back to bed later in the day.     Balance Overall balance assessment: Needs assistance Sitting-balance support: Bilateral upper extremity supported, Feet supported Sitting balance-Leahy Scale: Poor  Standing balance support: Bilateral upper extremity supported, During functional activity, Reliant on assistive device for balance Standing balance-Leahy Scale: Zero                             ADL either performed or assessed with clinical judgement   ADL Overall ADL's : Needs  assistance/impaired                             Toileting- Clothing Manipulation and Hygiene: Maximal assistance;Bed level Toileting - Clothing Manipulation Details (indicate cue type and reason): lateral rolling technique            Extremity/Trunk Assessment Upper Extremity Assessment Upper Extremity Assessment: Generalized weakness   Lower Extremity Assessment Lower Extremity Assessment: Generalized weakness        Vision Patient Visual Report: No change from baseline     Perception     Praxis      Cognition Arousal/Alertness: Awake/alert Behavior During Therapy: Anxious, WFL for tasks assessed/performed Overall Cognitive Status: No family/caregiver present to determine baseline cognitive functioning                                 General Comments: Pt has baseline cognition deficits per support caregiver and per POA (sister)        Exercises      Shoulder Instructions       General Comments      Pertinent Vitals/ Pain       Pain Assessment Pain Assessment: Faces Faces Pain Scale: Hurts little more Pain Location: bottom Pain Descriptors / Indicators: Discomfort Pain Intervention(s): Limited activity within patient's tolerance, Monitored during session  Home Living                                          Prior Functioning/Environment              Frequency  Min 2X/week        Progress Toward Goals  OT Goals(current goals can now be found in the care plan section)  Progress towards OT goals: Progressing toward goals  Acute Rehab OT Goals Patient Stated Goal: to not fall OT Goal Formulation: With patient Time For Goal Achievement: 10/10/21 Potential to Achieve Goals: [place] Discharge plan remains appropriate;Frequency remains appropriate    Co-evaluation    PT/OT/SLP Co-Evaluation/Treatment: Yes Reason for Co-Treatment: Complexity of the patient's impairments (multi-system  involvement);For patient/therapist safety;Necessary to address cognition/behavior during functional activity;To address functional/ADL transfers PT goals addressed during session: Mobility/safety with mobility;Balance;Proper use of DME OT goals addressed during session: ADL's and self-care      AM-PAC OT "6 Clicks" Daily Activity     Outcome Measure   Help from another person eating meals?: A Little Help from another person taking care of personal grooming?: A Lot Help from another person toileting, which includes using toliet, bedpan, or urinal?: Total Help from another person bathing (including washing, rinsing, drying)?: A Lot Help from another person to put on and taking off regular upper body clothing?: A Little Help from another person to put on and taking off regular lower body clothing?: Total 6 Click Score: 12    End of Session Equipment Utilized During Treatment: Rolling walker (2 wheels)  OT Visit Diagnosis: Unsteadiness on feet (R26.81);Muscle weakness (generalized) (M62.81);Other symptoms and signs involving cognitive function   Activity Tolerance Patient limited by lethargy   Patient Left in chair;with call bell/phone within reach;with chair alarm set   Nurse Communication Mobility status;Other (comment)        Time: 1040-4591 OT Time Calculation (min): 23 min  Charges: OT General Charges $OT Visit: 1 Visit OT Treatments $Self Care/Home Management : 8-22 mins  Gerrianne Scale, [place], OTR/L ascom [phone] 09/29/21, 6:26 PM

## 2021-09-30 DIAGNOSIS — G9340 Encephalopathy, unspecified: Secondary | ICD-10-CM | POA: Diagnosis not present

## 2021-09-30 LAB — BASIC METABOLIC PANEL
Anion gap: 9 (ref 5–15)
BUN: 36 mg/dL — ABNORMAL HIGH (ref 8–23)
CO2: 19 mmol/L — ABNORMAL LOW (ref 22–32)
Calcium: 9 mg/dL (ref 8.9–10.3)
Chloride: 106 mmol/L (ref 98–111)
Creatinine, Ser: 0.99 mg/dL (ref 0.44–1.00)
GFR, Estimated: >60 mL/min (ref >60)
Glucose, Bld: 158 mg/dL — ABNORMAL HIGH (ref 70–99)
Potassium: 4.2 mmol/L (ref 3.5–5.1)
Sodium: 134 mmol/L — ABNORMAL LOW (ref 135–145)

## 2021-09-30 LAB — CBC
HCT: 51.9 % — ABNORMAL HIGH (ref 36.0–46.0)
Hemoglobin: 16.1 g/dL — ABNORMAL HIGH (ref 12.0–15.0)
MCH: 28.4 pg (ref 26.0–34.0)
MCHC: 31 g/dL (ref 30.0–36.0)
MCV: 91.7 fL (ref 80.0–100.0)
Platelets: 219 10*3/uL (ref 150–400)
RBC: 5.66 MIL/uL — ABNORMAL HIGH (ref 3.87–5.11)
RDW: 14 % (ref 11.5–15.5)
WBC: 8 10*3/uL (ref 4.0–10.5)
nRBC: 0 % (ref 0.0–0.2)

## 2021-09-30 LAB — GLUCOSE, CAPILLARY
Glucose-Capillary: 155 mg/dL — ABNORMAL HIGH (ref 70–99)
Glucose-Capillary: 154 mg/dL — ABNORMAL HIGH (ref 70–99)
Glucose-Capillary: 151 mg/dL — ABNORMAL HIGH (ref 70–99)
Glucose-Capillary: 203 mg/dL — ABNORMAL HIGH (ref 70–99)

## 2021-09-30 LAB — MAGNESIUM: Magnesium: 1.7 mg/dL (ref 1.7–2.4)

## 2021-09-30 MED ORDER — LOPERAMIDE HCL 2 MG PO CAPS
2.0000 mg | ORAL_CAPSULE | ORAL | Status: DC | PRN
  Filled 2021-09-30: qty 1

## 2021-09-30 MED ORDER — QUETIAPINE FUMARATE 25 MG PO TABS
100.0000 mg | ORAL_TABLET | Freq: Every day | ORAL | Status: DC
  Administered 2021-09-30: 100 mg via ORAL
  Filled 2021-09-30: qty 4

## 2021-09-30 NOTE — Progress Notes (Signed)
Occupational Therapy Treatment Patient Details Name: Judith Shaw MRN: [id number] DOB: [birth year] Today's Date: 09/30/2021   History of present illness Pt is a 71 y/o F admitted on 09/23/21 who presented to the ED from SNF after pt had a brief episode of LOC. MRI was negative for any acute changes. Pt with elevated d-dimer, CT angiogram chest showed bilateral PE. Pt was started on IV heparin. PMH: DM2, HLD, depression, sleep apnea noncompliant with CPAP, hysterectomy for uterine CA.   OT comments  Pt seen for OT tx this date to f/u re: safety with ADLs/ADL mobility. Pt not agreeable to sitting EOB this date, but she is agreeable to engaging in self feeding. OT places pt in chair position after extended time and MAX A required to reposition pt closer to [facility]. Pt tolerates only moderately well as she does c/o 4/10 pain in her rear when in a modified sitting position. She requires SETUP to MIN A for self-feeding tasks (SETUP to place items in line of sight as pt with limited cervical ROM as well as opening some condiments, she requires MIN A for loose items such as green beans). Pt only consumes ~25% of tray and RN notified. Once pt finished eating, OT engages her in repositioning for comfort involving rolling with MAX A. Pt left with all needs met and in reach.    Recommendations for follow up therapy are one component of a multi-disciplinary discharge planning process, led by the attending physician.  Recommendations may be updated based on patient status, additional functional criteria and insurance authorization.    Follow Up Recommendations  Long-term institutional care without follow-up therapy    Assistance Recommended at Discharge Frequent or constant Supervision/Assistance  Patient can return home with the following  Two people to help with walking and/or transfers;Two people to help with bathing/dressing/bathroom   Equipment Recommendations  Other (comment) (hoyer)    Recommendations for  Other Services      Precautions / Restrictions Precautions Precautions: Fall Restrictions Weight Bearing Restrictions: No       Mobility Bed Mobility Overal bed mobility: Needs Assistance Bed Mobility: Rolling Rolling: Max assist, Total assist         General bed mobility comments: Pt requires increased time and cues to meaningfully contribute with UE and bed rails, for rolling to reposition.    Transfers                         Balance                                           ADL either performed or assessed with clinical judgement   ADL Overall ADL's : Needs assistance/impaired Eating/Feeding: Set up;Minimal assistance;Sitting Eating/Feeding Details (indicate cue type and reason): chair position in air bed used for optimal feeding/swallowing. Able to self-feed viscous items like mashed potatoes with SETUP, requires MIN A for loose items like green beans.                                        Extremity/Trunk Assessment              Vision       Perception     Praxis      Cognition Arousal/Alertness: Awake/alert Behavior  During Therapy: Anxious, WFL for tasks assessed/performed, Agitated Overall Cognitive Status: No family/caregiver present to determine baseline cognitive functioning                                 General Comments: Pt has baseline cognition deficits per support caregiver and per POA (sister). Easily agitated if you do not do what she asks promptly (ex: pt asked to be turned and OT obliges, but while getting bed prepared-flattening HOB, using chucks to propel towards HOB-pt starts to clench teeth and command OT "TURN ME!")        Exercises Other Exercises Other Exercises: OT engages pt in self feeding with SETUP for viscous items with spoon and MIN A for loose items with fork such as green beans. She is able to self-drink with SETUP and increased time from cup with lid and straw  once OT places in pt's view. She requires tactile cues to locate various utensils and MIN A to cut meatloaf. Placed in chair position in bed throughout this task, RN updated on pt participation.    Shoulder Instructions       General Comments      Pertinent Vitals/ Pain       Pain Assessment Pain Assessment: Faces Faces Pain Scale: Hurts little more Pain Location: bottom Pain Descriptors / Indicators: Discomfort Pain Intervention(s): Limited activity within patient's tolerance, Monitored during session, Repositioned  Home Living                                          Prior Functioning/Environment              Frequency  Min 2X/week        Progress Toward Goals  OT Goals(current goals can now be found in the care plan section)  Progress towards OT goals: Progressing toward goals  Acute Rehab OT Goals Patient Stated Goal: to not fall OT Goal Formulation: With patient Time For Goal Achievement: 10/10/21 Potential to Achieve Goals: [place] Discharge plan remains appropriate;Frequency remains appropriate    Co-evaluation                 AM-PAC OT "6 Clicks" Daily Activity     Outcome Measure   Help from another person eating meals?: A Little Help from another person taking care of personal grooming?: A Lot Help from another person toileting, which includes using toliet, bedpan, or urinal?: Total Help from another person bathing (including washing, rinsing, drying)?: A Lot Help from another person to put on and taking off regular upper body clothing?: A Little Help from another person to put on and taking off regular lower body clothing?: Total 6 Click Score: 12    End of Session    OT Visit Diagnosis: Unsteadiness on feet (R26.81);Muscle weakness (generalized) (M62.81);Other symptoms and signs involving cognitive function   Activity Tolerance Patient limited by lethargy   Patient Left in bed;with bed alarm set;with call  bell/phone within reach   Nurse Communication Other (comment) (only 25% of tray consumed)        Time: 4818-5631 OT Time Calculation (min): 29 min  Charges: OT General Charges $OT Visit: 1 Visit OT Treatments $Self Care/Home Management : 8-22 mins $Therapeutic Activity: 8-22 mins  Gerrianne Scale, [place], OTR/L ascom [phone] 09/30/21, 5:57 PM

## 2021-09-30 NOTE — Progress Notes (Signed)
PROGRESS NOTE    Judith Shaw  UXN:[id number] DOB: [birth year] DOA: 09/23/2021 PCP: Pcp, No  112A/112A-AA   Assessment & Plan:   Principal Problem:   Acute encephalopathy Active Problems:   Diabetes ([place])   Chronic diastolic CHF (congestive heart failure) (HCC)   Benign essential HTN   Dementia without behavioral disturbance ([place])   Syncope   Lethargy   Pressure injury of skin   Bilateral pulmonary embolism (HCC)   Judith Shaw is a 71 y.o. female with history of diabetes mellitus type 2, hyperlipidemia, depression, sleep apnea noncompliant with CPAP prior history of hysterectomy for uterine CA was brought to the ER from skilled nursing facility after patient had a brief episode of loss of consciousness. MRI brain did not show any acute changes. She has elevated D-dimer, CT angiogram chest showed bilateral PE.  She is started on IV heparin.  Switched to Eliquis on 1/29.   Bilateral pulm emboli. --cont Eliquis  Acute metabolic encephalopathy, resolved  Syncope and hypotension --inconsistent report of hypotension at the SNF, however, BP 140's on presentation.   Syncope maybe secondary to PE.  Sister reported prior hospitalizations with symptomatic hypotension, although each time pt was found to have an acute medical condition. --BP has been 100's-150's without BP medication. --monitor BP   Generalized weakness. Frequent falls --TOC working with sister to find SNF placement  Klebsiella pneumonia UTI. --s/p a dose of fosfomycin, treated.   Morbid obesity. Patient with a BMI of 38, with associated chronic diastolic congestive heart failure, hypertension and type 2 diabetes.   Type 2 diabetes. --cont glargine 15u nightly --SSI   Essential hypertension --not on BP med PTA --monitor   Pressure ulcer POA Pressure Injury 09/23/21 Coccyx Stage 2 -  Partial thickness loss of dermis presenting as a shallow open injury with a red, pink wound bed without slough. (Active)   09/23/21 1919  Location: Coccyx  Location Orientation:   Staging: Stage 2 -  Partial thickness loss of dermis presenting as a shallow open injury with a red, pink wound bed without slough.  Wound Description (Comments):   Present on Admission: Yes   --RN to order specialty bed   DVT prophylaxis: UR:KYHCWCB Code Status: Full code  Family Communication:  Level of care: Med-Surg Dispo:   The patient is from: SNF Anticipated d/c is to: PT rec SNF Anticipated d/c date is: undetermined Patient currently is medically ready to d/c, but no safe disposition plan.   Subjective and Interval History:  No acute events.  Working on facility placement.   Objective: Vitals:   09/29/21 1953 09/30/21 0600 09/30/21 0630 09/30/21 0839  BP: (!) 155/69 (!) 147/62 106/77 (!) 143/71  Pulse: 83 61 (!) 59 61  Resp: 18 18 20 19   Temp: 98.7 F (37.1 C) 97.7 F (36.5 C) 97.9 F (36.6 C) 98.1 F (36.7 C)  TempSrc:  Axillary    SpO2: 94% 95% 93% 93%  Weight:      Height:        Intake/Output Summary (Last 24 hours) at 09/30/2021 1548 Last data filed at 09/30/2021 0636 Gross per 24 hour  Intake --  Output 600 ml  Net -600 ml   Filed Weights   09/24/21 0532 09/27/21 1102  Weight: 98.1 kg 98.1 kg    Examination:   Constitutional: NAD CV: No cyanosis.   RESP: normal respiratory effort, on RA Extremities: No effusions, edema in BLE SKIN: warm, dry   Data Reviewed: I have personally reviewed  following labs and imaging studies  CBC: Recent Labs  Lab 09/24/21 0507 09/25/21 0621 09/26/21 0651 09/29/21 0721 09/30/21 0518  WBC 7.9 9.4 8.0 8.4 8.0  NEUTROABS 4.9  --   --   --   --   HGB 15.5* 15.2* 15.6* 15.6* 16.1*  HCT 47.8* 46.6* 48.5* 46.3* 51.9*  MCV 89.5 87.9 89.3 87.2 91.7  PLT 229 242 212 252 678   Basic Metabolic Panel: Recent Labs  Lab 09/23/21 2029 09/24/21 0507 09/29/21 0721 09/30/21 0518  NA  --  138 135 134*  K  --  4.7 4.3 4.2  CL  --  102 105 106  CO2  --   26 22 19*  GLUCOSE  --  166* 145* 158*  BUN  --  32* 37* 36*  CREATININE 1.08* 0.96 1.06* 0.99  CALCIUM  --  9.5 9.0 9.0  MG  --   --  1.5* 1.7   GFR: Estimated Creatinine Clearance: 58.2 mL/min (by C-G formula based on SCr of 0.99 mg/dL). Liver Function Tests: Recent Labs  Lab 09/24/21 0507  AST 27  ALT 22  ALKPHOS 103  BILITOT 0.4  PROT 6.8  ALBUMIN 3.0*   No results for input(s): LIPASE, AMYLASE in the last 168 hours.  Recent Labs  Lab 09/23/21 2029  AMMONIA 11   Coagulation Profile: Recent Labs  Lab 09/24/21 1417  INR 1.1   Cardiac Enzymes: Recent Labs  Lab 09/23/21 2029  CKTOTAL 242*   BNP (last 3 results) No results for input(s): PROBNP in the last 8760 hours. HbA1C: No results for input(s): HGBA1C in the last 72 hours. CBG: Recent Labs  Lab 09/29/21 1145 09/29/21 1602 09/29/21 2200 09/30/21 0840 09/30/21 1150  GLUCAP 183* 168* 176* 155* 154*   Lipid Profile: No results for input(s): CHOL, HDL, LDLCALC, TRIG, CHOLHDL, LDLDIRECT in the last 72 hours. Thyroid Function Tests: No results for input(s): TSH, T4TOTAL, FREET4, T3FREE, THYROIDAB in the last 72 hours. Anemia Panel: No results for input(s): VITAMINB12, FOLATE, FERRITIN, TIBC, IRON, RETICCTPCT in the last 72 hours. Sepsis Labs: No results for input(s): PROCALCITON, LATICACIDVEN in the last 168 hours.   Recent Results (from the past 240 hour(s))  Resp Panel by RT-PCR (Flu A&B, Covid) Nasopharyngeal Swab     Status: None   Collection Time: 09/23/21 11:42 AM   Specimen: Nasopharyngeal Swab; Nasopharyngeal(NP) swabs in vial transport medium  Result Value Ref Range Status   SARS Coronavirus 2 by RT PCR NEGATIVE NEGATIVE Final    Comment: (NOTE) SARS-CoV-2 target nucleic acids are NOT DETECTED.  The SARS-CoV-2 RNA is generally detectable in upper respiratory specimens during the acute phase of infection. The lowest concentration of SARS-CoV-2 viral copies this assay can detect is 138  copies/mL. A negative result does not preclude SARS-Cov-2 infection and should not be used as the sole basis for treatment or other patient management decisions. A negative result may occur with  improper specimen collection/handling, submission of specimen other than nasopharyngeal swab, presence of viral mutation(s) within the areas targeted by this assay, and inadequate number of viral copies(<138 copies/mL). A negative result must be combined with clinical observations, patient history, and epidemiological information. The expected result is Negative.  Fact Sheet for Patients:  EntrepreneurPulse.com.au  Fact Sheet for Healthcare Providers:  IncredibleEmployment.be  This test is no t yet approved or cleared by the [place] FDA and  has been authorized for detection and/or diagnosis of SARS-CoV-2 by FDA under an Emergency Use Authorization (EUA).  This EUA will remain  in effect (meaning this test can be used) for the duration of the COVID-19 declaration under Section 564(b)(1) of the Act, 21 U.S.C.section 360bbb-3(b)(1), unless the authorization is terminated  or revoked sooner.       Influenza A by PCR NEGATIVE NEGATIVE Final   Influenza B by PCR NEGATIVE NEGATIVE Final    Comment: (NOTE) The Xpert Xpress SARS-CoV-2/FLU/RSV plus assay is intended as an aid in the diagnosis of influenza from Nasopharyngeal swab specimens and should not be used as a sole basis for treatment. Nasal washings and aspirates are unacceptable for Xpert Xpress SARS-CoV-2/FLU/RSV testing.  Fact Sheet for Patients: EntrepreneurPulse.com.au  Fact Sheet for Healthcare Providers: IncredibleEmployment.be  This test is not yet approved or cleared by the [place] FDA and has been authorized for detection and/or diagnosis of SARS-CoV-2 by FDA under an Emergency Use Authorization (EUA). This EUA will remain in effect (meaning  this test can be used) for the duration of the COVID-19 declaration under Section 564(b)(1) of the Act, 21 U.S.C. section 360bbb-3(b)(1), unless the authorization is terminated or revoked.  Performed at [facility], [address] [place], [place] [address]   Urine Culture     Status: Abnormal   Collection Time: 09/23/21 11:51 PM   Specimen: Urine, Catheterized  Result Value Ref Range Status   Specimen Description   Final    URINE, CATHETERIZED Performed at [facility], [address] [place], [place] [address]    Special Requests   Final    NONE Performed at [facility], [place], [place] [address]    Culture >=100,000 COLONIES/mL KLEBSIELLA PNEUMONIAE (A)  Final   Report Status 09/26/2021 FINAL  Final   Organism ID, Bacteria KLEBSIELLA PNEUMONIAE (A)  Final      Susceptibility   Klebsiella pneumoniae - MIC*    AMPICILLIN RESISTANT Resistant     CEFAZOLIN <=4 SENSITIVE Sensitive     CEFEPIME <=0.12 SENSITIVE Sensitive     CEFTRIAXONE <=0.25 SENSITIVE Sensitive     CIPROFLOXACIN <=0.25 SENSITIVE Sensitive     GENTAMICIN <=1 SENSITIVE Sensitive     IMIPENEM <=0.25 SENSITIVE Sensitive     NITROFURANTOIN 64 INTERMEDIATE Intermediate     TRIMETH/SULFA <=20 SENSITIVE Sensitive     AMPICILLIN/SULBACTAM 4 SENSITIVE Sensitive     PIP/TAZO <=4 SENSITIVE Sensitive     * >=100,000 COLONIES/mL KLEBSIELLA PNEUMONIAE      Radiology Studies: No results found.   Scheduled Meds:  apixaban  10 mg Oral BID   Followed by   Derrill Memo ON 10/02/2021] apixaban  5 mg Oral BID   ARIPiprazole  5 mg Oral Daily   busPIRone  10 mg Oral TID   donepezil  10 mg Oral QHS   gabapentin  300 mg Oral QHS   insulin aspart  0-5 Units Subcutaneous QHS   insulin aspart  0-9 Units Subcutaneous TID WC   insulin glargine-yfgn  15 Units Subcutaneous QHS   LORazepam  0.5 mg Oral Q8H   Continuous Infusions:   LOS: 6 days     Enzo Bi, MD [facility] If 7PM-7AM, please contact night-coverage 09/30/2021, 3:48 PM

## 2021-09-30 NOTE — TOC Progression Note (Signed)
Transition of Care [facility]) - Progression Note    Patient Details  Name: Judith Shaw MRN: [id number] Date of Birth: February 06, 1951  Transition of Care [facility]) CM/SW [place], LCSW Phone Number: 09/30/2021, 10:22 AM  Clinical Narrative:  Judith Shaw is reviewing referral.     Expected Discharge Plan: Skilled Nursing Facility Barriers to Discharge: Continued Medical Work up  Expected Discharge Plan and Services Expected Discharge Plan: [place] Choice: [place] (LTAC) Living arrangements for the past 2 months: [place]                                       Social Determinants of Health (SDOH) Interventions    Readmission Risk Interventions Readmission Risk Prevention Plan 08/12/2021  Transportation Screening Complete  PCP or Specialist Appt within 3-5 Days Complete  HRI or Home Care Consult Complete  Social Work Consult for [place] Planning/Counseling Complete  Palliative Care Screening Not Applicable  Medication Review [employer] photographer) Complete  Some recent data might be hidden

## 2021-09-30 NOTE — Care Management Important Message (Signed)
Important Message  Patient Details  Name: Judith Shaw MRN: [id number] Date of Birth: [birth year]   Medicare Important Message Given:  Yes  I reviewed the Important Message from Medicare with the patients Legal Guardian/POA by phone [phone]) and she stated she understood her rights.  I asked if she would like a copy and she replied yes. I have sent a copy via secure e-mail as directed to: [email or web address]. I wished her well and thanked her for her time.  [name] A Kameran Mcneese 09/30/2021, 2:09 PM

## 2021-10-01 DIAGNOSIS — G9340 Encephalopathy, unspecified: Secondary | ICD-10-CM | POA: Diagnosis not present

## 2021-10-01 LAB — GLUCOSE, CAPILLARY
Glucose-Capillary: 136 mg/dL — ABNORMAL HIGH (ref 70–99)
Glucose-Capillary: 217 mg/dL — ABNORMAL HIGH (ref 70–99)
Glucose-Capillary: 169 mg/dL — ABNORMAL HIGH (ref 70–99)
Glucose-Capillary: 151 mg/dL — ABNORMAL HIGH (ref 70–99)

## 2021-10-01 MED ORDER — QUETIAPINE FUMARATE 25 MG PO TABS
50.0000 mg | ORAL_TABLET | Freq: Every day | ORAL | Status: DC
  Administered 2021-10-01 – 2021-10-02 (×2): 50 mg via ORAL
  Filled 2021-10-01 (×2): qty 2

## 2021-10-01 NOTE — Progress Notes (Signed)
PROGRESS NOTE    [name]  YQM:[id number] DOB: 10/23/1950 DOA: 09/23/2021 PCP: Pcp, No  112A/112A-AA   Assessment & Plan:   Principal Problem:   Acute encephalopathy Active Problems:   Diabetes ([place])   Chronic diastolic CHF (congestive heart failure) (HCC)   Benign essential HTN   Dementia without behavioral disturbance ([place])   Syncope   Lethargy   Pressure injury of skin   Bilateral pulmonary embolism (HCC)   Judith Shaw is a 71 y.o. female with history of diabetes mellitus type 2, hyperlipidemia, depression, sleep apnea noncompliant with CPAP prior history of hysterectomy for uterine CA was brought to the ER from skilled nursing facility after patient had a brief episode of loss of consciousness. MRI brain did not show any acute changes. She has elevated D-dimer, CT angiogram chest showed bilateral PE.  She is started on IV heparin.  Switched to Eliquis on 1/29.   Bilateral pulm emboli. --cont Eliquis  Acute metabolic encephalopathy, resolved  Syncope and hypotension --inconsistent report of hypotension at the SNF, however, BP 140's on presentation.   Syncope maybe secondary to PE.  Sister reported prior hospitalizations with symptomatic hypotension, although each time pt was found to have an acute medical condition. --BP has been 100's-150's without BP medication. --monitor BP   Generalized weakness. Frequent falls --TOC working with sister to find SNF placement  Klebsiella pneumonia UTI. --s/p a dose of fosfomycin, treated.   Morbid obesity. Patient with a BMI of 38, with associated chronic diastolic congestive heart failure, hypertension and type 2 diabetes.   Type 2 diabetes. --cont glargine 15u nightly --SSI   Essential hypertension --not on BP med PTA --monitor   Pressure ulcer POA Pressure Injury 09/23/21 Coccyx Stage 2 -  Partial thickness loss of dermis presenting as a shallow open injury with a red, pink wound bed without slough. (Active)   09/23/21 1919  Location: Coccyx  Location Orientation:   Staging: Stage 2 -  Partial thickness loss of dermis presenting as a shallow open injury with a red, pink wound bed without slough.  Wound Description (Comments):   Present on Admission: Yes   --RN to order specialty bed   DVT prophylaxis: [id number] Code Status: Full code  Family Communication:  Level of care: Med-Surg Dispo:   The patient is from: SNF Anticipated d/c is to: PT rec SNF Anticipated d/c date is: undetermined Patient currently is medically ready to d/c, but no safe disposition plan.  TOC working with sister POA to locate a facility   Subjective and Interval History:  Pt was ordered seroquel before bedtime last night, and slept and didn't yell all night as she did in prior nights, per RN.  Pt was still very sleepy this morning.   Objective: Vitals:   09/30/21 1930 10/01/21 0548 10/01/21 0833 10/01/21 1208  BP: 108/66 (!) 134/91 109/63 (!) 96/55  Pulse: 80 (!) 55 73 78  Resp: 20 20 18    Temp: 98.4 F (36.9 C) 97.8 F (36.6 C) (!) 97 F (36.1 C) (!) 97.5 F (36.4 C)  TempSrc: Oral     SpO2: 93% 96% 95% 90%  Weight:      Height:        Intake/Output Summary (Last 24 hours) at 10/01/2021 1701 Last data filed at 10/01/2021 1405 Gross per 24 hour  Intake --  Output 500 ml  Net -500 ml   Filed Weights   09/24/21 0532 09/27/21 1102  Weight: 98.1 kg 98.1 kg  Examination:   Constitutional: NAD, sleepy, but arousable CV: No cyanosis.   RESP: normal respiratory effort, on RA Extremities: No effusions, edema in BLE SKIN: warm, dry   Data Reviewed: I have personally reviewed following labs and imaging studies  CBC: Recent Labs  Lab 09/25/21 0621 09/26/21 0651 09/29/21 0721 09/30/21 0518  WBC 9.4 8.0 8.4 8.0  HGB 15.2* 15.6* 15.6* 16.1*  HCT 46.6* 48.5* 46.3* 51.9*  MCV 87.9 89.3 87.2 91.7  PLT 242 212 252 818   Basic Metabolic Panel: Recent Labs  Lab 09/29/21 0721 09/30/21 0518   NA 135 134*  K 4.3 4.2  CL 105 106  CO2 22 19*  GLUCOSE 145* 158*  BUN 37* 36*  CREATININE 1.06* 0.99  CALCIUM 9.0 9.0  MG 1.5* 1.7   GFR: Estimated Creatinine Clearance: 58.2 mL/min (by C-G formula based on SCr of 0.99 mg/dL). Liver Function Tests: No results for input(s): AST, ALT, ALKPHOS, BILITOT, PROT, ALBUMIN in the last 168 hours.  No results for input(s): LIPASE, AMYLASE in the last 168 hours.  No results for input(s): AMMONIA in the last 168 hours.  Coagulation Profile: No results for input(s): INR, PROTIME in the last 168 hours.  Cardiac Enzymes: No results for input(s): CKTOTAL, CKMB, CKMBINDEX, TROPONINI in the last 168 hours.  BNP (last 3 results) No results for input(s): PROBNP in the last 8760 hours. HbA1C: No results for input(s): HGBA1C in the last 72 hours. CBG: Recent Labs  Lab 09/30/21 1621 09/30/21 1947 10/01/21 0835 10/01/21 1210 10/01/21 1603  GLUCAP 151* 203* 136* 217* 169*   Lipid Profile: No results for input(s): CHOL, HDL, LDLCALC, TRIG, CHOLHDL, LDLDIRECT in the last 72 hours. Thyroid Function Tests: No results for input(s): TSH, T4TOTAL, FREET4, T3FREE, THYROIDAB in the last 72 hours. Anemia Panel: No results for input(s): VITAMINB12, FOLATE, FERRITIN, TIBC, IRON, RETICCTPCT in the last 72 hours. Sepsis Labs: No results for input(s): PROCALCITON, LATICACIDVEN in the last 168 hours.   Recent Results (from the past 240 hour(s))  Resp Panel by RT-PCR (Flu A&B, Covid) Nasopharyngeal Swab     Status: None   Collection Time: 09/23/21 11:42 AM   Specimen: Nasopharyngeal Swab; Nasopharyngeal(NP) swabs in vial transport medium  Result Value Ref Range Status   SARS Coronavirus 2 by RT PCR NEGATIVE NEGATIVE Final    Comment: (NOTE) SARS-CoV-2 target nucleic acids are NOT DETECTED.  The SARS-CoV-2 RNA is generally detectable in upper respiratory specimens during the acute phase of infection. The lowest concentration of SARS-CoV-2 viral  copies this assay can detect is 138 copies/mL. A negative result does not preclude SARS-Cov-2 infection and should not be used as the sole basis for treatment or other patient management decisions. A negative result may occur with  improper specimen collection/handling, submission of specimen other than nasopharyngeal swab, presence of viral mutation(s) within the areas targeted by this assay, and inadequate number of viral copies(<138 copies/mL). A negative result must be combined with clinical observations, patient history, and epidemiological information. The expected result is Negative.  Fact Sheet for Patients:  EntrepreneurPulse.com.au  Fact Sheet for Healthcare Providers:  IncredibleEmployment.be  This test is no t yet approved or cleared by the [place] FDA and  has been authorized for detection and/or diagnosis of SARS-CoV-2 by FDA under an Emergency Use Authorization (EUA). This EUA will remain  in effect (meaning this test can be used) for the duration of the COVID-19 declaration under Section 564(b)(1) of the Act, 21 U.S.C.section 360bbb-3(b)(1), unless the authorization  is terminated  or revoked sooner.       Influenza A by PCR NEGATIVE NEGATIVE Final   Influenza B by PCR NEGATIVE NEGATIVE Final    Comment: (NOTE) The Xpert Xpress SARS-CoV-2/FLU/RSV plus assay is intended as an aid in the diagnosis of influenza from Nasopharyngeal swab specimens and should not be used as a sole basis for treatment. Nasal washings and aspirates are unacceptable for Xpert Xpress SARS-CoV-2/FLU/RSV testing.  Fact Sheet for Patients: EntrepreneurPulse.com.au  Fact Sheet for Healthcare Providers: IncredibleEmployment.be  This test is not yet approved or cleared by the [place] FDA and has been authorized for detection and/or diagnosis of SARS-CoV-2 by FDA under an Emergency Use Authorization (EUA). This  EUA will remain in effect (meaning this test can be used) for the duration of the COVID-19 declaration under Section 564(b)(1) of the Act, 21 U.S.C. section 360bbb-3(b)(1), unless the authorization is terminated or revoked.  Performed at [facility], [address] [place], [place] [address]   Urine Culture     Status: Abnormal   Collection Time: 09/23/21 11:51 PM   Specimen: Urine, Catheterized  Result Value Ref Range Status   Specimen Description   Final    URINE, CATHETERIZED Performed at [facility], [address] [place], [place] [address]    Special Requests   Final    NONE Performed at [facility], [place], [place] [address]    Culture >=100,000 COLONIES/mL KLEBSIELLA PNEUMONIAE (A)  Final   Report Status 09/26/2021 FINAL  Final   Organism ID, Bacteria KLEBSIELLA PNEUMONIAE (A)  Final      Susceptibility   Klebsiella pneumoniae - MIC*    AMPICILLIN RESISTANT Resistant     CEFAZOLIN <=4 SENSITIVE Sensitive     CEFEPIME <=0.12 SENSITIVE Sensitive     CEFTRIAXONE <=0.25 SENSITIVE Sensitive     CIPROFLOXACIN <=0.25 SENSITIVE Sensitive     GENTAMICIN <=1 SENSITIVE Sensitive     IMIPENEM <=0.25 SENSITIVE Sensitive     NITROFURANTOIN 64 INTERMEDIATE Intermediate     TRIMETH/SULFA <=20 SENSITIVE Sensitive     AMPICILLIN/SULBACTAM 4 SENSITIVE Sensitive     PIP/TAZO <=4 SENSITIVE Sensitive     * >=100,000 COLONIES/mL KLEBSIELLA PNEUMONIAE      Radiology Studies: No results found.   Scheduled Meds:  apixaban  10 mg Oral BID   Followed by   Derrill Memo ON 10/02/2021] apixaban  5 mg Oral BID   ARIPiprazole  5 mg Oral Daily   busPIRone  10 mg Oral TID   donepezil  10 mg Oral QHS   gabapentin  300 mg Oral QHS   insulin aspart  0-5 Units Subcutaneous QHS   insulin aspart  0-9 Units Subcutaneous TID WC   insulin glargine-yfgn  15 Units Subcutaneous QHS   LORazepam  0.5 mg Oral Q8H   QUEtiapine  50 mg Oral QHS   Continuous  Infusions:   LOS: 7 days     Enzo Bi, MD [facility] If 7PM-7AM, please contact night-coverage 10/01/2021, 5:01 PM

## 2021-10-02 DIAGNOSIS — G9340 Encephalopathy, unspecified: Secondary | ICD-10-CM | POA: Diagnosis not present

## 2021-10-02 LAB — GLUCOSE, CAPILLARY
Glucose-Capillary: 123 mg/dL — ABNORMAL HIGH (ref 70–99)
Glucose-Capillary: 189 mg/dL — ABNORMAL HIGH (ref 70–99)
Glucose-Capillary: 237 mg/dL — ABNORMAL HIGH (ref 70–99)

## 2021-10-02 NOTE — Progress Notes (Signed)
PROGRESS NOTE    Judith Shaw  RCV:[id number] DOB: [birth year] DOA: 09/23/2021 PCP: Pcp, No  112A/112A-AA   Assessment & Plan:   Principal Problem:   Acute encephalopathy Active Problems:   Diabetes ([place])   Chronic diastolic CHF (congestive heart failure) (HCC)   Benign essential HTN   Dementia without behavioral disturbance ([place])   Syncope   Lethargy   Pressure injury of skin   Bilateral pulmonary embolism (HCC)   Judith Shaw is a 71 y.o. female with history of diabetes mellitus type 2, hyperlipidemia, depression, sleep apnea noncompliant with CPAP prior history of hysterectomy for uterine CA was brought to the ER from skilled nursing facility after patient had a brief episode of loss of consciousness. MRI brain did not show any acute changes. She has elevated D-dimer, CT angiogram chest showed bilateral PE.  She is started on IV heparin.  Switched to Eliquis on 1/29.   Bilateral pulm emboli. --cont Eliquis  Acute metabolic encephalopathy, resolved --started on seroquel nightly to help pt sleep, due to pt staying awake at night and yelling all night long. --cont Seroquel 50 mg nightly  Syncope and hypotension --inconsistent report of hypotension at the SNF, however, BP 140's on presentation.   Syncope maybe secondary to PE.  Sister reported prior hospitalizations with symptomatic hypotension, although each time pt was found to have an acute medical condition. --BP has been 100's-150's without BP medication. --monitor BP   Generalized weakness. Frequent falls --TOC working with sister to find SNF placement  Klebsiella pneumonia UTI. --s/p a dose of fosfomycin, treated.   Morbid obesity. Patient with a BMI of 38, with associated chronic diastolic congestive heart failure, hypertension and type 2 diabetes.   Type 2 diabetes. --cont glargine 15u nightly --SSI   Essential hypertension --not on BP med PTA --monitor   Pressure ulcer POA Pressure Injury 09/23/21  Coccyx Stage 2 -  Partial thickness loss of dermis presenting as a shallow open injury with a red, pink wound bed without slough. (Active)  09/23/21 1919  Location: Coccyx  Location Orientation:   Staging: Stage 2 -  Partial thickness loss of dermis presenting as a shallow open injury with a red, pink wound bed without slough.  Wound Description (Comments):   Present on Admission: Yes   --RN to order specialty bed   DVT prophylaxis: [id number] Code Status: Full code  Family Communication:  Level of care: Med-Surg Dispo:   The patient is from: SNF Anticipated d/c is to: PT rec SNF Anticipated d/c date is: undetermined Patient currently is medically ready to d/c, but no safe disposition plan.  TOC working with sister POA to locate a facility   Subjective and Interval History:  Pt said she slept last night.  Was able to stay awake today and ate her meals.     Objective: Vitals:   10/01/21 1933 10/02/21 0421 10/02/21 0725 10/02/21 1202  BP: (!) 106/48 (!) 100/47 132/69 (!) 120/59  Pulse: 69 76 62 63  Resp: (!) 21 17 18 16   Temp: 98.9 F (37.2 C) 98.6 F (37 C) 97.8 F (36.6 C) 98 F (36.7 C)  TempSrc: Oral Oral Oral Oral  SpO2: 90% 95% 90% 95%  Weight:      Height:        Intake/Output Summary (Last 24 hours) at 10/02/2021 1653 Last data filed at 10/02/2021 1404 Gross per 24 hour  Intake 960 ml  Output 750 ml  Net 210 ml   [employer]  09/24/21 0532 09/27/21 1102  Weight: 98.1 kg 98.1 kg    Examination:   Constitutional: NAD, AAOx3 HEENT: conjunctivae and lids normal, EOMI CV: No cyanosis.   RESP: normal respiratory effort, on RA Extremities: No effusions, edema in BLE SKIN: warm, dry   Data Reviewed: I have personally reviewed following labs and imaging studies  CBC: Recent Labs  Lab 09/26/21 0651 09/29/21 0721 09/30/21 0518  WBC 8.0 8.4 8.0  HGB 15.6* 15.6* 16.1*  HCT 48.5* 46.3* 51.9*  MCV 89.3 87.2 91.7  PLT 212 252 371   Basic Metabolic  Panel: Recent Labs  Lab 09/29/21 0721 09/30/21 0518  NA 135 134*  K 4.3 4.2  CL 105 106  CO2 22 19*  GLUCOSE 145* 158*  BUN 37* 36*  CREATININE 1.06* 0.99  CALCIUM 9.0 9.0  MG 1.5* 1.7   GFR: Estimated Creatinine Clearance: 58.2 mL/min (by C-G formula based on SCr of 0.99 mg/dL). Liver Function Tests: No results for input(s): AST, ALT, ALKPHOS, BILITOT, PROT, ALBUMIN in the last 168 hours.  No results for input(s): LIPASE, AMYLASE in the last 168 hours.  No results for input(s): AMMONIA in the last 168 hours.  Coagulation Profile: No results for input(s): INR, PROTIME in the last 168 hours.  Cardiac Enzymes: No results for input(s): CKTOTAL, CKMB, CKMBINDEX, TROPONINI in the last 168 hours.  BNP (last 3 results) No results for input(s): PROBNP in the last 8760 hours. HbA1C: No results for input(s): HGBA1C in the last 72 hours. CBG: Recent Labs  Lab 10/01/21 1210 10/01/21 1603 10/01/21 2151 10/02/21 0800 10/02/21 1208  GLUCAP 217* 169* 151* 123* 189*   Lipid Profile: No results for input(s): CHOL, HDL, LDLCALC, TRIG, CHOLHDL, LDLDIRECT in the last 72 hours. Thyroid Function Tests: No results for input(s): TSH, T4TOTAL, FREET4, T3FREE, THYROIDAB in the last 72 hours. Anemia Panel: No results for input(s): VITAMINB12, FOLATE, FERRITIN, TIBC, IRON, RETICCTPCT in the last 72 hours. Sepsis Labs: No results for input(s): PROCALCITON, LATICACIDVEN in the last 168 hours.   Recent Results (from the past 240 hour(s))  Resp Panel by RT-PCR (Flu A&B, Covid) Nasopharyngeal Swab     Status: None   Collection Time: 09/23/21 11:42 AM   Specimen: Nasopharyngeal Swab; Nasopharyngeal(NP) swabs in vial transport medium  Result Value Ref Range Status   SARS Coronavirus 2 by RT PCR NEGATIVE NEGATIVE Final    Comment: (NOTE) SARS-CoV-2 target nucleic acids are NOT DETECTED.  The SARS-CoV-2 RNA is generally detectable in upper respiratory specimens during the acute phase of  infection. The lowest concentration of SARS-CoV-2 viral copies this assay can detect is 138 copies/mL. A negative result does not preclude SARS-Cov-2 infection and should not be used as the sole basis for treatment or other patient management decisions. A negative result may occur with  improper specimen collection/handling, submission of specimen other than nasopharyngeal swab, presence of viral mutation(s) within the areas targeted by this assay, and inadequate number of viral copies(<138 copies/mL). A negative result must be combined with clinical observations, patient history, and epidemiological information. The expected result is Negative.  Fact Sheet for Patients:  EntrepreneurPulse.com.au  Fact Sheet for Healthcare Providers:  IncredibleEmployment.be  This test is no t yet approved or cleared by the [place] FDA and  has been authorized for detection and/or diagnosis of SARS-CoV-2 by FDA under an Emergency Use Authorization (EUA). This EUA will remain  in effect (meaning this test can be used) for the duration of the COVID-19 declaration under Section  564(b)(1) of the Act, 21 U.S.C.section 360bbb-3(b)(1), unless the authorization is terminated  or revoked sooner.       Influenza A by PCR NEGATIVE NEGATIVE Final   Influenza B by PCR NEGATIVE NEGATIVE Final    Comment: (NOTE) The Xpert Xpress SARS-CoV-2/FLU/RSV plus assay is intended as an aid in the diagnosis of influenza from Nasopharyngeal swab specimens and should not be used as a sole basis for treatment. Nasal washings and aspirates are unacceptable for Xpert Xpress SARS-CoV-2/FLU/RSV testing.  Fact Sheet for Patients: EntrepreneurPulse.com.au  Fact Sheet for Healthcare Providers: IncredibleEmployment.be  This test is not yet approved or cleared by the [place] FDA and has been authorized for detection and/or diagnosis of SARS-CoV-2  by FDA under an Emergency Use Authorization (EUA). This EUA will remain in effect (meaning this test can be used) for the duration of the COVID-19 declaration under Section 564(b)(1) of the Act, 21 U.S.C. section 360bbb-3(b)(1), unless the authorization is terminated or revoked.  Performed at [facility], [address] [place], [place] [address]   Urine Culture     Status: Abnormal   Collection Time: 09/23/21 11:51 PM   Specimen: Urine, Catheterized  Result Value Ref Range Status   Specimen Description   Final    URINE, CATHETERIZED Performed at [facility], [address] [place], [place] [address]    Special Requests   Final    NONE Performed at [facility], [place], [place] [address]    Culture >=100,000 COLONIES/mL KLEBSIELLA PNEUMONIAE (A)  Final   Report Status 09/26/2021 FINAL  Final   Organism ID, Bacteria KLEBSIELLA PNEUMONIAE (A)  Final      Susceptibility   Klebsiella pneumoniae - MIC*    AMPICILLIN RESISTANT Resistant     CEFAZOLIN <=4 SENSITIVE Sensitive     CEFEPIME <=0.12 SENSITIVE Sensitive     CEFTRIAXONE <=0.25 SENSITIVE Sensitive     CIPROFLOXACIN <=0.25 SENSITIVE Sensitive     GENTAMICIN <=1 SENSITIVE Sensitive     IMIPENEM <=0.25 SENSITIVE Sensitive     NITROFURANTOIN 64 INTERMEDIATE Intermediate     TRIMETH/SULFA <=20 SENSITIVE Sensitive     AMPICILLIN/SULBACTAM 4 SENSITIVE Sensitive     PIP/TAZO <=4 SENSITIVE Sensitive     * >=100,000 COLONIES/mL KLEBSIELLA PNEUMONIAE      Radiology Studies: No results found.   Scheduled Meds:  apixaban  5 mg Oral BID   ARIPiprazole  5 mg Oral Daily   busPIRone  10 mg Oral TID   donepezil  10 mg Oral QHS   gabapentin  300 mg Oral QHS   insulin aspart  0-5 Units Subcutaneous QHS   insulin aspart  0-9 Units Subcutaneous TID WC   insulin glargine-yfgn  15 Units Subcutaneous QHS   LORazepam  0.5 mg Oral Q8H   QUEtiapine  50 mg Oral QHS   Continuous  Infusions:   LOS: 8 days     Enzo Bi, MD [facility] If 7PM-7AM, please contact night-coverage 10/02/2021, 4:53 PM

## 2021-10-02 NOTE — Progress Notes (Signed)
Patient refused 1700 CBG and insulin. Patient has been refusing at first with 0800 and 1200 but staff has reinforced the importance of checking and maintaining blood sugars and patient has been compliant. Staff was unsuccessful at encouraging 1700 CBG and insulin. Patient states she understands the importance but "just can't take it right now." BP elevated, unable to give PRN Hydralazine d/t no IV access. Will recheck after patient eats dinner. Patient demanded to be transferred to chair, staff transferred patient via hoyer lift. Pillow placed under buttocks to relieve pressure. Patient educated on the importance of being out of bed and verbalized understanding. Dr. [name] Ruddy made aware of CBG/insulin refusal and elevated BP.

## 2021-10-03 DIAGNOSIS — G9340 Encephalopathy, unspecified: Secondary | ICD-10-CM | POA: Diagnosis not present

## 2021-10-03 LAB — GLUCOSE, CAPILLARY
Glucose-Capillary: 173 mg/dL — ABNORMAL HIGH (ref 70–99)
Glucose-Capillary: 169 mg/dL — ABNORMAL HIGH (ref 70–99)
Glucose-Capillary: 154 mg/dL — ABNORMAL HIGH (ref 70–99)

## 2021-10-03 MED ORDER — KETOROLAC TROMETHAMINE 15 MG/ML IJ SOLN: 15.0000 mg | Freq: Once | INTRAMUSCULAR | Status: AC

## 2021-10-03 MED ORDER — FENTANYL CITRATE PF 50 MCG/ML IJ SOSY: 12.5000 ug | PREFILLED_SYRINGE | Freq: Once | INTRAMUSCULAR | Status: DC

## 2021-10-03 MED ORDER — QUETIAPINE FUMARATE 25 MG PO TABS
75.0000 mg | ORAL_TABLET | Freq: Every day | ORAL | Status: DC
  Administered 2021-10-03 – 2021-10-20 (×18): 75 mg via ORAL
  Filled 2021-10-03 (×18): qty 3

## 2021-10-03 NOTE — Progress Notes (Signed)
PROGRESS NOTE    Judith Shaw  QJJ:[id number] DOB: [birth year] DOA: 09/23/2021 PCP: Pcp, No  112A/112A-AA   Assessment & Plan:   Principal Problem:   Acute encephalopathy Active Problems:   Diabetes ([place])   Chronic diastolic CHF (congestive heart failure) (HCC)   Benign essential HTN   Dementia without behavioral disturbance ([place])   Syncope   Lethargy   Pressure injury of skin   Bilateral pulmonary embolism (HCC)   Judith Shaw is a 71 y.o. female with history of diabetes mellitus type 2, hyperlipidemia, depression, sleep apnea noncompliant with CPAP prior history of hysterectomy for uterine CA was brought to the ER from skilled nursing facility after patient had a brief episode of loss of consciousness. MRI brain did not show any acute changes. She has elevated D-dimer, CT angiogram chest showed bilateral PE.  She is started on IV heparin.  Switched to Eliquis on 1/29.   Bilateral pulm emboli. --cont Eliquis  Acute metabolic encephalopathy, resolved --started on seroquel nightly to help pt sleep, due to pt staying awake at night and yelling all night long. --cont Seroquel 50 mg nightly  Syncope and hypotension --inconsistent report of hypotension at the SNF, however, BP 140's on presentation.   Syncope maybe secondary to PE.  Sister reported prior hospitalizations with symptomatic hypotension, although each time pt was found to have an acute medical condition. --BP has been 100's-150's without BP medication. --monitor BP   Generalized weakness. Frequent falls --TOC working with sister to find SNF placement  Klebsiella pneumonia UTI. --s/p a dose of fosfomycin, treated.   Morbid obesity. Patient with a BMI of 38, with associated chronic diastolic congestive heart failure, hypertension and type 2 diabetes.   Type 2 diabetes. --cont glargine 15u nightly --SSI   Essential hypertension --not on BP med PTA --monitor   Pressure ulcer POA Pressure Injury 09/23/21  Coccyx Stage 2 -  Partial thickness loss of dermis presenting as a shallow open injury with a red, pink wound bed without slough. (Active)  09/23/21 1919  Location: Coccyx  Location Orientation:   Staging: Stage 2 -  Partial thickness loss of dermis presenting as a shallow open injury with a red, pink wound bed without slough.  Wound Description (Comments):   Present on Admission: Yes   --RN to order specialty bed   DVT prophylaxis: [id number] Code Status: Full code  Family Communication:  Level of care: Med-Surg Dispo:   The patient is from: SNF Anticipated d/c is to: PT rec SNF Anticipated d/c date is: undetermined Patient currently is medically ready to d/c, but no safe disposition plan.  TOC working with sister POA to locate a facility   Subjective and Interval History:  Per RN, pt was again up at night yelling.  Pt was sleeping during the day.   Objective: Vitals:   10/03/21 0554 10/03/21 0751 10/03/21 1238 10/03/21 1657  BP: 122/68 (!) 91/58 (!) 98/58 (!) 122/35  Pulse: 61 76 76 65  Resp: 18 20 18 20   Temp: 98 F (36.7 C) 99.2 F (37.3 C) 98.4 F (36.9 C) 98.6 F (37 C)  TempSrc: Oral   Axillary  SpO2: 95% 94% 95% 95%  Weight:      Height:        Intake/Output Summary (Last 24 hours) at 10/03/2021 1845 Last data filed at 10/03/2021 1439 Gross per 24 hour  Intake 540 ml  Output 1050 ml  Net -510 ml   Filed Weights   09/24/21 0532 09/27/21  1102  Weight: 98.1 kg 98.1 kg    Examination:   Constitutional: NAD, sleeping but arousable, oriented HEENT: conjunctivae and lids normal, EOMI CV: No cyanosis.   RESP: normal respiratory effort, on RA Extremities: No effusions, edema in BLE SKIN: warm, dry Neuro: II - XII grossly intact.     Data Reviewed: I have personally reviewed following labs and imaging studies  CBC: Recent Labs  Lab 09/29/21 0721 09/30/21 0518  WBC 8.4 8.0  HGB 15.6* 16.1*  HCT 46.3* 51.9*  MCV 87.2 91.7  PLT 252 433   Basic  Metabolic Panel: Recent Labs  Lab 09/29/21 0721 09/30/21 0518  NA 135 134*  K 4.3 4.2  CL 105 106  CO2 22 19*  GLUCOSE 145* 158*  BUN 37* 36*  CREATININE 1.06* 0.99  CALCIUM 9.0 9.0  MG 1.5* 1.7   GFR: Estimated Creatinine Clearance: 58.2 mL/min (by C-G formula based on SCr of 0.99 mg/dL). Liver Function Tests: No results for input(s): AST, ALT, ALKPHOS, BILITOT, PROT, ALBUMIN in the last 168 hours.  No results for input(s): LIPASE, AMYLASE in the last 168 hours.  No results for input(s): AMMONIA in the last 168 hours.  Coagulation Profile: No results for input(s): INR, PROTIME in the last 168 hours.  Cardiac Enzymes: No results for input(s): CKTOTAL, CKMB, CKMBINDEX, TROPONINI in the last 168 hours.  BNP (last 3 results) No results for input(s): PROBNP in the last 8760 hours. HbA1C: No results for input(s): HGBA1C in the last 72 hours. CBG: Recent Labs  Lab 10/02/21 1208 10/02/21 2010 10/03/21 0753 10/03/21 1239 10/03/21 1658  GLUCAP 189* 237* 173* 169* 154*   Lipid Profile: No results for input(s): CHOL, HDL, LDLCALC, TRIG, CHOLHDL, LDLDIRECT in the last 72 hours. Thyroid Function Tests: No results for input(s): TSH, T4TOTAL, FREET4, T3FREE, THYROIDAB in the last 72 hours. Anemia Panel: No results for input(s): VITAMINB12, FOLATE, FERRITIN, TIBC, IRON, RETICCTPCT in the last 72 hours. Sepsis Labs: No results for input(s): PROCALCITON, LATICACIDVEN in the last 168 hours.   Recent Results (from the past 240 hour(s))  Urine Culture     Status: Abnormal   Collection Time: 09/23/21 11:51 PM   Specimen: Urine, Catheterized  Result Value Ref Range Status   Specimen Description   Final    URINE, CATHETERIZED Performed at [facility], [address] [place], [place] [address]    Special Requests   Final    NONE Performed at [facility], [place], [place] [address]    Culture >=100,000 COLONIES/mL KLEBSIELLA PNEUMONIAE  (A)  Final   Report Status 09/26/2021 FINAL  Final   Organism ID, Bacteria KLEBSIELLA PNEUMONIAE (A)  Final      Susceptibility   Klebsiella pneumoniae - MIC*    AMPICILLIN RESISTANT Resistant     CEFAZOLIN <=4 SENSITIVE Sensitive     CEFEPIME <=0.12 SENSITIVE Sensitive     CEFTRIAXONE <=0.25 SENSITIVE Sensitive     CIPROFLOXACIN <=0.25 SENSITIVE Sensitive     GENTAMICIN <=1 SENSITIVE Sensitive     IMIPENEM <=0.25 SENSITIVE Sensitive     NITROFURANTOIN 64 INTERMEDIATE Intermediate     TRIMETH/SULFA <=20 SENSITIVE Sensitive     AMPICILLIN/SULBACTAM 4 SENSITIVE Sensitive     PIP/TAZO <=4 SENSITIVE Sensitive     * >=100,000 COLONIES/mL KLEBSIELLA PNEUMONIAE      Radiology Studies: No results found.   Scheduled Meds:  apixaban  5 mg Oral BID   ARIPiprazole  5 mg Oral Daily  busPIRone  10 mg Oral TID   donepezil  10 mg Oral QHS   gabapentin  300 mg Oral QHS   insulin aspart  0-5 Units Subcutaneous QHS   insulin aspart  0-9 Units Subcutaneous TID WC   insulin glargine-yfgn  15 Units Subcutaneous QHS   LORazepam  0.5 mg Oral Q8H   QUEtiapine  50 mg Oral QHS   Continuous Infusions:   LOS: 9 days     Enzo Bi, MD [facility] If 7PM-7AM, please contact night-coverage 10/03/2021, 6:45 PM

## 2021-10-03 NOTE — TOC Progression Note (Signed)
Transition of Care [facility]) - Progression Note    Patient Details  Name: Judith Shaw MRN: [id number] Date of Birth: Mar 01, 1951  Transition of Care [facility]) CM/SW Contact  Judith Pancoast, RN Phone Number: 10/03/2021, 3:37 PM  Clinical Narrative:    Spoke to sister, Judith Shaw regarding concerns related to placement. Family requesting LTC placement under private pay. Prefers [place] or possibly [place] area. Sister, POA remains in town however remains in quarantine due to [place] but has someone who is able to assist with getting needed information. Discussed in detail payment sources and concerns. Reviewed patient has multiple sources of income and is not expected to qualify for Medicaid. Family is prepared to pay private for placement. Patient currently has private sitter sitting with her in the hospital room and is able to assist with documents as needed.   RN CM spoke to [place] @ [facility] and confirmed [facility] was unable to accept patient due to clinical concerns.    Expected Discharge Plan: [place] Barriers to Discharge: Continued Medical Work up  Expected Discharge Plan and Services Expected Discharge Plan: [place] Choice: [place] (LTAC) Living arrangements for the past 2 months: [place]                                       Social Determinants of Health (SDOH) Interventions    Readmission Risk Interventions Readmission Risk Prevention Plan 08/12/2021  Transportation Screening Complete  PCP or Specialist Appt within 3-5 Days Complete  HRI or [place] Complete  Social Work Consult for [place] Planning/Counseling Complete  Palliative Care Screening Not Applicable  Medication Review Press photographer) Complete  Some recent data might be hidden

## 2021-10-03 NOTE — TOC Progression Note (Signed)
Transition of Care [facility]) - Progression Note    Patient Details  Name: Judith Shaw MRN: [id number] Date of Birth: [birth year]  Transition of Care [facility]) CM/SW [place], RN Phone Number: 10/03/2021, 11:51 AM  Clinical Narrative:   [place] has declined admission at this time due to financial information.  RNCM will continue to follow and attempt to get [place] for patient and communicate with POA sister.    Expected Discharge Plan: [place] Barriers to Discharge: Continued Medical Work up  Expected Discharge Plan and Services Expected Discharge Plan: [place] Choice: [place] (LTAC) Living arrangements for the past 2 months: [place]                                       Social Determinants of Health (SDOH) Interventions    Readmission Risk Interventions Readmission Risk Prevention Plan 08/12/2021  Transportation Screening Complete  PCP or Specialist Appt within 3-5 Days Complete  HRI or [place] Complete  Social Work Consult for [place] Planning/Counseling Complete  Palliative Care Screening Not Applicable  Medication Review Press photographer) Complete  Some recent data might be hidden

## 2021-10-03 NOTE — Progress Notes (Signed)
Physical Therapy Treatment Patient Details Name: Judith Shaw MRN: [id number] DOB: 01-25-1951 Today's Date: 10/03/2021   History of Present Illness Pt is a 71 y/o F admitted on 09/23/21 who presented to the ED from SNF after pt had a brief episode of LOC. MRI was negative for any acute changes. Pt with elevated d-dimer, CT angiogram chest showed bilateral PE. Pt was started on IV heparin. PMH: DM2, HLD, depression, sleep apnea noncompliant with CPAP, hysterectomy for uterine CA.    PT Comments    Pt seen for PT tx with nursing students present to assist pt 2/2 incontinent BM. Pt requires +2-3 to assist with rolling L<>R with pt able to hold to bed rail with max cuing & assist to reach rail. Pt keeps eyes closed throughout majority of session, only appears oriented to self. PT requires dependent assist for peri hygiene. Attempted to reposition pt in bed with HOB elevated. Due to pt's limited progress with PT, will plan to see pt for 1-2 more sessions on a trial basis, and if still no progress will plan to d/c from acute care caseload.     Recommendations for follow up therapy are one component of a multi-disciplinary discharge planning process, led by the attending physician.  Recommendations may be updated based on patient status, additional functional criteria and insurance authorization.  Follow Up Recommendations  Long-term institutional care without follow-up therapy     Assistance Recommended at Discharge Frequent or constant Supervision/Assistance  Patient can return home with the following Two people to help with walking and/or transfers;Two people to help with bathing/dressing/bathroom;Direct supervision/assist for medications management;Help with stairs or ramp for entrance;Assist for transportation;Direct supervision/assist for financial management;Assistance with cooking/housework   Equipment Recommendations  None recommended by PT    Recommendations for Other Services        Precautions / Restrictions Precautions Precautions: Fall Restrictions Weight Bearing Restrictions: No     Mobility  Bed Mobility Overal bed mobility: Needs Assistance Bed Mobility: Rolling Rolling: Total assist, +2 for physical assistance (+2-3)              Transfers                        Ambulation/Gait                   Stairs             Wheelchair Mobility    Modified Rankin (Stroke Patients Only)       Balance                                            Cognition Arousal/Alertness: Lethargic (maintains eyes closed throughout majority of session) Behavior During Therapy: Flat affect   Area of Impairment: Orientation, Memory, Attention, Safety/judgement, Awareness, Problem solving, Following commands                 Orientation Level: Disoriented to, Place, Time, Situation   Memory: Decreased recall of precautions, Decreased short-term memory Following Commands: Follows one step commands inconsistently, Follows one step commands with increased time Safety/Judgement: Decreased awareness of deficits, Decreased awareness of safety Awareness: Emergent, Anticipatory, Intellectual Problem Solving: Slow processing, Requires verbal cues, Requires tactile cues General Comments: Pt AxO to self only, follows simple commands 1x during session with multimodal cuing  Exercises      General Comments        Pertinent Vitals/Pain Pain Assessment Pain Assessment: Faces Faces Pain Scale: Hurts little more Pain Location: BLE when pt is assisted with bed mobility Pain Descriptors / Indicators: Discomfort Pain Intervention(s): Repositioned, Monitored during session    Home Living                          Prior Function            PT Goals (current goals can now be found in the care plan section) Acute Rehab PT Goals Patient Stated Goal: none stated PT Goal Formulation: With patient Time  For Goal Achievement: 10/09/21 Potential to Achieve Goals: Poor Progress towards PT goals: PT to reassess next treatment    Frequency    Min 2X/week      PT Plan Current plan remains appropriate    Co-evaluation              AM-PAC PT "6 Clicks" Mobility   Outcome Measure  Help needed turning from your back to your side while in a flat bed without using bedrails?: Total Help needed moving from lying on your back to sitting on the side of a flat bed without using bedrails?: Total Help needed moving to and from a bed to a chair (including a wheelchair)?: Total Help needed standing up from a chair using your arms (e.g., wheelchair or bedside chair)?: Total Help needed to walk in hospital room?: Total Help needed climbing 3-5 steps with a railing? : Total 6 Click Score: 6    End of Session   Activity Tolerance: Patient tolerated treatment well Patient left: in bed;with call bell/phone within reach   PT Visit Diagnosis: Muscle weakness (generalized) (M62.81);Difficulty in walking, not elsewhere classified (R26.2)     Time: 1779-3903 PT Time Calculation (min) (ACUTE ONLY): 16 min  Charges:  $Therapeutic Activity: 8-22 mins                     Lavone Nian, PT, DPT 10/03/21, 3:31 PM    Waunita Schooner 10/03/2021, 3:30 PM

## 2021-10-03 NOTE — Progress Notes (Signed)
CH will return later. Pt needed adjustments to bed / bedding and so I said I'd be back.

## 2021-10-03 NOTE — TOC Progression Note (Signed)
Transition of Care [facility]) - Progression Note    Patient Details  Name: HETVI SHAWHAN MRN: [id number] Date of Birth: 16-Dec-1950  Transition of Care [facility]) CM/SW [place], RN Phone Number: 10/03/2021, 2:51 PM  Clinical Narrative:   Patient does not have bed offers at this time.  [facility] is considering, sent message to [place] at [place] to obtain information on placement.  Re-sent bed search to all facilities that have listed as Pending in Epic for re-consideration.  Message given to patient's caregiver.    Expected Discharge Plan: [place] Barriers to Discharge: Continued Medical Work up  Expected Discharge Plan and Services Expected Discharge Plan: [place] Choice: [place] (LTAC) Living arrangements for the past 2 months: [place]                                       Social Determinants of Health (SDOH) Interventions    Readmission Risk Interventions Readmission Risk Prevention Plan 08/12/2021  Transportation Screening Complete  PCP or Specialist Appt within 3-5 Days Complete  HRI or [place] Complete  Social Work Consult for [place] Planning/Counseling Complete  Palliative Care Screening Not Applicable  Medication Review Press photographer) Complete  Some recent data might be hidden

## 2021-10-03 NOTE — TOC Progression Note (Signed)
Transition of Care [facility]) - Progression Note    Patient Details  Name: Judith Shaw MRN: [id number] Date of Birth: 09-Jul-1951  Transition of Care [facility]) CM/SW [place], RN Phone Number: 10/03/2021, 4:32 PM  Clinical Narrative:    Call to [facility] @ [place] reports she may have a LTC option in [place] area. Agreed to review clinical faxed to [phone].    Expected Discharge Plan: [place] Barriers to Discharge: Continued Medical Work up  Expected Discharge Plan and Services Expected Discharge Plan: [place] Choice: [place] (LTAC) Living arrangements for the past 2 months: [place]                                       Social Determinants of Health (SDOH) Interventions    Readmission Risk Interventions Readmission Risk Prevention Plan 08/12/2021  Transportation Screening Complete  PCP or Specialist Appt within 3-5 Days Complete  HRI or [place] Complete  Social Work Consult for [place] Planning/Counseling Complete  Palliative Care Screening Not Applicable  Medication Review Press photographer) Complete  Some recent data might be hidden

## 2021-10-04 DIAGNOSIS — G9340 Encephalopathy, unspecified: Secondary | ICD-10-CM | POA: Diagnosis not present

## 2021-10-04 LAB — BASIC METABOLIC PANEL
Anion gap: 6 (ref 5–15)
BUN: 34 mg/dL — ABNORMAL HIGH (ref 8–23)
CO2: 25 mmol/L (ref 22–32)
Calcium: 9.3 mg/dL (ref 8.9–10.3)
Chloride: 108 mmol/L (ref 98–111)
Creatinine, Ser: 0.92 mg/dL (ref 0.44–1.00)
GFR, Estimated: >60 mL/min (ref >60)
Glucose, Bld: 156 mg/dL — ABNORMAL HIGH (ref 70–99)
Potassium: 4.4 mmol/L (ref 3.5–5.1)
Sodium: 139 mmol/L (ref 135–145)

## 2021-10-04 LAB — CBC
HCT: 46.2 % — ABNORMAL HIGH (ref 36.0–46.0)
Hemoglobin: 15 g/dL (ref 12.0–15.0)
MCH: 29.2 pg (ref 26.0–34.0)
MCHC: 32.5 g/dL (ref 30.0–36.0)
MCV: 89.9 fL (ref 80.0–100.0)
Platelets: 253 10*3/uL (ref 150–400)
RBC: 5.14 MIL/uL — ABNORMAL HIGH (ref 3.87–5.11)
RDW: 13.7 % (ref 11.5–15.5)
WBC: 6.7 10*3/uL (ref 4.0–10.5)
nRBC: 0 % (ref 0.0–0.2)

## 2021-10-04 LAB — GLUCOSE, CAPILLARY
Glucose-Capillary: 147 mg/dL — ABNORMAL HIGH (ref 70–99)
Glucose-Capillary: 145 mg/dL — ABNORMAL HIGH (ref 70–99)
Glucose-Capillary: 151 mg/dL — ABNORMAL HIGH (ref 70–99)
Glucose-Capillary: 144 mg/dL — ABNORMAL HIGH (ref 70–99)

## 2021-10-04 LAB — MAGNESIUM: Magnesium: 1.7 mg/dL (ref 1.7–2.4)

## 2021-10-04 NOTE — Progress Notes (Signed)
PROGRESS NOTE    Judith Shaw  HAL:[id number] DOB: 06/17/1951 DOA: 09/23/2021 PCP: Pcp, No  112A/112A-AA   Assessment & Plan:   Principal Problem:   Acute encephalopathy Active Problems:   Diabetes ([place])   Chronic diastolic CHF (congestive heart failure) (HCC)   Benign essential HTN   Dementia without behavioral disturbance ([place])   Syncope   Lethargy   Pressure injury of skin   Bilateral pulmonary embolism (HCC)   Judith Shaw is a 71 y.o. female with history of diabetes mellitus type 2, hyperlipidemia, depression, sleep apnea noncompliant with CPAP prior history of hysterectomy for uterine CA was brought to the ER from skilled nursing facility after patient had a brief episode of loss of consciousness. MRI brain did not show any acute changes. She has elevated D-dimer, CT angiogram chest showed bilateral PE.  She is started on IV heparin.  Switched to Eliquis on 1/29.   Bilateral pulm emboli. --cont Eliquis  Acute metabolic encephalopathy, resolved --started on seroquel nightly to help pt sleep, due to pt staying awake at night and yelling all night long. --cont Seroquel 50 mg nightly  Syncope and hypotension --inconsistent report of hypotension at the SNF, however, BP 140's on presentation.   Syncope maybe secondary to PE.  Sister reported prior hospitalizations with symptomatic hypotension, although each time pt was found to have an acute medical condition. --BP has been 100's-150's without BP medication. --monitor BP   Generalized weakness. Frequent falls --TOC working with sister to find SNF placement  Klebsiella pneumonia UTI. --s/p a dose of fosfomycin, treated.   Morbid obesity. Patient with a BMI of 38, with associated chronic diastolic congestive heart failure, hypertension and type 2 diabetes.   Type 2 diabetes. --cont glargine 15u nightly --SSI   Essential hypertension --not on BP med PTA --monitor   Pressure ulcer POA Pressure Injury 09/23/21  Coccyx Stage 2 -  Partial thickness loss of dermis presenting as a shallow open injury with a red, pink wound bed without slough. (Active)  09/23/21 1919  Location: Coccyx  Location Orientation:   Staging: Stage 2 -  Partial thickness loss of dermis presenting as a shallow open injury with a red, pink wound bed without slough.  Wound Description (Comments):   Present on Admission: Yes   --RN to order specialty bed   DVT prophylaxis: [id number] Code Status: Full code  Family Communication:  Level of care: Med-Surg Dispo:   The patient is from: SNF Anticipated d/c is to: PT rec SNF Anticipated d/c date is: undetermined Patient currently is medically ready to d/c, but no safe disposition plan.  TOC working with sister POA to locate a facility   Subjective and Interval History:  Pt continued to sleep most of the day, and often found awake at night yelling out.  Checked labs today, all at baseline.   Objective: Vitals:   10/04/21 1145 10/04/21 1631 10/04/21 1653 10/04/21 2000  BP: (!) 121/56 (!) 86/59 102/67 (!) 107/59  Pulse: (!) 54 (!) 59 (!) 58 64  Resp: 19 16  18   Temp: 99.6 F (37.6 C) 97.7 F (36.5 C)  98.2 F (36.8 C)  TempSrc:  Oral    SpO2: 93% 93%  94%  Weight:      Height:        Intake/Output Summary (Last 24 hours) at 10/04/2021 2026 Last data filed at 10/04/2021 1900 Gross per 24 hour  Intake 0 ml  Output 300 ml  Net -300 ml  Filed Weights   09/24/21 0532 09/27/21 1102  Weight: 98.1 kg 98.1 kg    Examination:   Constitutional: NAD, sleeping CV: No cyanosis.   RESP: normal respiratory effort, on RA Extremities: No effusions, edema in BLE SKIN: warm, dry   Data Reviewed: I have personally reviewed following labs and imaging studies  CBC: Recent Labs  Lab 09/29/21 0721 09/30/21 0518 10/04/21 1129  WBC 8.4 8.0 6.7  HGB 15.6* 16.1* 15.0  HCT 46.3* 51.9* 46.2*  MCV 87.2 91.7 89.9  PLT 252 219 419   Basic Metabolic Panel: Recent Labs   Lab 09/29/21 0721 09/30/21 0518 10/04/21 1129  NA 135 134* 139  K 4.3 4.2 4.4  CL 105 106 108  CO2 22 19* 25  GLUCOSE 145* 158* 156*  BUN 37* 36* 34*  CREATININE 1.06* 0.99 0.92  CALCIUM 9.0 9.0 9.3  MG 1.5* 1.7 1.7   GFR: Estimated Creatinine Clearance: 62.6 mL/min (by C-G formula based on SCr of 0.92 mg/dL). Liver Function Tests: No results for input(s): AST, ALT, ALKPHOS, BILITOT, PROT, ALBUMIN in the last 168 hours.  No results for input(s): LIPASE, AMYLASE in the last 168 hours.  No results for input(s): AMMONIA in the last 168 hours.  Coagulation Profile: No results for input(s): INR, PROTIME in the last 168 hours.  Cardiac Enzymes: No results for input(s): CKTOTAL, CKMB, CKMBINDEX, TROPONINI in the last 168 hours.  BNP (last 3 results) No results for input(s): PROBNP in the last 8760 hours. HbA1C: No results for input(s): HGBA1C in the last 72 hours. CBG: Recent Labs  Lab 10/03/21 1239 10/03/21 1658 10/04/21 0759 10/04/21 1215 10/04/21 1630  GLUCAP 169* 154* 147* 145* 151*   Lipid Profile: No results for input(s): CHOL, HDL, LDLCALC, TRIG, CHOLHDL, LDLDIRECT in the last 72 hours. Thyroid Function Tests: No results for input(s): TSH, T4TOTAL, FREET4, T3FREE, THYROIDAB in the last 72 hours. Anemia Panel: No results for input(s): VITAMINB12, FOLATE, FERRITIN, TIBC, IRON, RETICCTPCT in the last 72 hours. Sepsis Labs: No results for input(s): PROCALCITON, LATICACIDVEN in the last 168 hours.   No results found for this or any previous visit (from the past 240 hour(s)).     Radiology Studies: No results found.   Scheduled Meds:  apixaban  5 mg Oral BID   ARIPiprazole  5 mg Oral Daily   busPIRone  10 mg Oral TID   donepezil  10 mg Oral QHS   gabapentin  300 mg Oral QHS   insulin aspart  0-5 Units Subcutaneous QHS   insulin aspart  0-9 Units Subcutaneous TID WC   insulin glargine-yfgn  15 Units Subcutaneous QHS   ketorolac  15 mg Intravenous Once    LORazepam  0.5 mg Oral Q8H   QUEtiapine  75 mg Oral QHS   Continuous Infusions:   LOS: 10 days     Enzo Bi, MD [facility] If 7PM-7AM, please contact night-coverage 10/04/2021, 8:26 PM

## 2021-10-04 NOTE — Progress Notes (Signed)
Physical Therapy Treatment Patient Details Name: Judith Shaw MRN: [id number] DOB: 05/29/1951 Today's Date: 10/04/2021   History of Present Illness Pt is a 71 y/o F admitted on 09/23/21 who presented to the ED from SNF after pt had a brief episode of LOC. MRI was negative for any acute changes. Pt with elevated d-dimer, CT angiogram chest showed bilateral PE. Pt was started on IV heparin. PMH: DM2, HLD, depression, sleep apnea noncompliant with CPAP, hysterectomy for uterine CA.    PT Comments    Patient sleeping on arrival to room. Patient required +2 person assistance for bed mobility, sitting up on edge of bed and rolling in bed. Sitting tolerance of less than 2 minutes with complaints of low back pain with upright activity. No protective righting reactions in sitting position with 1-2 person assistance required to maintain upright sitting position. She does participate with LE exercises in bed for strengthening. PT will continue to follow on a trial basis in attempts to maximize independence and decrease caregiver burden.    Recommendations for follow up therapy are one component of a multi-disciplinary discharge planning process, led by the attending physician.  Recommendations may be updated based on patient status, additional functional criteria and insurance authorization.  Follow Up Recommendations  Long-term institutional care without follow-up therapy     Assistance Recommended at Discharge Frequent or constant Supervision/Assistance  Patient can return home with the following Two people to help with walking and/or transfers;Two people to help with bathing/dressing/bathroom;Direct supervision/assist for medications management;Help with stairs or ramp for entrance;Assist for transportation;Direct supervision/assist for financial management;Assistance with cooking/housework   Equipment Recommendations  None recommended by PT    Recommendations for Other Services       Precautions /  Restrictions Precautions Precautions: Fall Restrictions Weight Bearing Restrictions: No     Mobility  Bed Mobility Overal bed mobility: Needs Assistance Bed Mobility: Rolling, Supine to Sit, Sit to Supine Rolling: Total assist, +2 for physical assistance   Supine to sit: Total assist, +2 for physical assistance Sit to supine: Total assist, +2 for physical assistance   General bed mobility comments: despite cues for participation and task initiation, patient participated minimally with rolling and sitting up on edge of bed. total assistance +2 required. of note, patient was incontinent of bowel during session and required maximal assistance for peri-care    Transfers                   General transfer comment: unable to attempt due to poor sitting tolerance, fatigue with minimal activity, lower back pain reported with upright activity. patient does not assist with maintaining sitting balance    Ambulation/Gait                   Stairs             Wheelchair Mobility    Modified Rankin (Stroke Patients Only)       Balance Overall balance assessment: Needs assistance Sitting-balance support: No upper extremity supported, Feet unsupported Sitting balance-Leahy Scale: Zero Sitting balance - Comments: +1-2 person required to maintain sitting balance for ~ 2 minutes. no protective righting reactions in sitting position                                    Cognition Arousal/Alertness: Lethargic Behavior During Therapy: Flat affect Overall Cognitive Status: No family/caregiver present to determine baseline cognitive functioning  General Comments: patient has eyes closed most of the session. minimal overall participation despite multi-modal cueing and encouragement        Exercises General Exercises - Lower Extremity Ankle Circles/Pumps: AAROM, Strengthening, Both, 10 reps, Supine Heel Slides:  AAROM, Strengthening, Both, 10 reps, Supine Hip ABduction/ADduction: AAROM, Strengthening, Both, 10 reps, Supine Other Exercises Other Exercises: patient does participate with LE exercises wtih cues for technique    General Comments        Pertinent Vitals/Pain Pain Assessment Pain Assessment: Faces Faces Pain Scale: Hurts little more Pain Location: lower back and legs with mobility Pain Descriptors / Indicators: Discomfort Pain Intervention(s): Limited activity within patient's tolerance    Home Living                          Prior Function            PT Goals (current goals can now be found in the care plan section) Acute Rehab PT Goals Patient Stated Goal: none stated PT Goal Formulation: With patient Time For Goal Achievement: 10/09/21 Potential to Achieve Goals: Poor Progress towards PT goals: Progressing toward goals    Frequency    Min 2X/week      PT Plan Current plan remains appropriate    Co-evaluation   Reason for Co-Treatment: Necessary to address cognition/behavior during functional activity;For patient/therapist safety;To address functional/ADL transfers PT goals addressed during session: Mobility/safety with mobility OT goals addressed during session: ADL's and self-care      AM-PAC PT "6 Clicks" Mobility   Outcome Measure  Help needed turning from your back to your side while in a flat bed without using bedrails?: Total Help needed moving from lying on your back to sitting on the side of a flat bed without using bedrails?: Total Help needed moving to and from a bed to a chair (including a wheelchair)?: Total Help needed standing up from a chair using your arms (e.g., wheelchair or bedside chair)?: Total Help needed to walk in hospital room?: Total Help needed climbing 3-5 steps with a railing? : Total 6 Click Score: 6    End of Session   Activity Tolerance: Patient limited by fatigue;Patient limited by lethargy Patient left:  in bed;with call bell/phone within reach   PT Visit Diagnosis: Muscle weakness (generalized) (M62.81);Difficulty in walking, not elsewhere classified (R26.2)     Time: 7829-5621 PT Time Calculation (min) (ACUTE ONLY): 27 min  Charges:  $Therapeutic Activity: 8-22 mins                     Judith Shaw, PT, MPT    Judith Shaw 10/04/2021, 3:20 PM

## 2021-10-04 NOTE — Progress Notes (Signed)
Occupational Therapy Treatment Patient Details Name: Judith Shaw MRN: [id number] DOB: [birth year] Today's Date: 10/04/2021   History of present illness Pt is a 71 y/o F admitted on 09/23/21 who presented to the ED from SNF after pt had a brief episode of LOC. MRI was negative for any acute changes. Pt with elevated d-dimer, CT angiogram chest showed bilateral PE. Pt was started on IV heparin. PMH: DM2, HLD, depression, sleep apnea noncompliant with CPAP, hysterectomy for uterine CA.   OT comments  Judith Shaw was seen for OT/PT co-treatment on this date. Upon arrival to room pt sleeping, wakes to light and voice, requires encouragement to participate. Pt requires TOTAL A x2 sup<>sit, tolerates ~23min sitting EOB. TOTAL A x2 toileting at bed level - hand over hand placement of R arm on bed rail, does not pull on to assist with rolling.  Pt making poor progress toward goals. Pt continues to benefit from skilled OT services to maximize return to PLOF and minimize risk of future falls, injury, caregiver burden, and readmission. Will continue to follow POC however if pt unable to participate next 1-2 sessions will d/c from caseload. Discharge recommendation remains appropriate.     Recommendations for follow up therapy are one component of a multi-disciplinary discharge planning process, led by the attending physician.  Recommendations may be updated based on patient status, additional functional criteria and insurance authorization.    Follow Up Recommendations  Long-term institutional care without follow-up therapy    Assistance Recommended at Discharge Frequent or constant Supervision/Assistance  Patient can return home with the following  Two people to help with walking and/or transfers;Two people to help with bathing/dressing/bathroom   Equipment Recommendations  Other (comment) (defer)    Recommendations for Other Services      Precautions / Restrictions Precautions Precautions:  Fall Restrictions Weight Bearing Restrictions: No       Mobility Bed Mobility Overal bed mobility: Needs Assistance Bed Mobility: Rolling, Supine to Sit, Sit to Supine Rolling: Total assist, +2 for physical assistance   Supine to sit: Total assist, +2 for physical assistance Sit to supine: Total assist, +2 for physical assistance        Transfers                         Balance Overall balance assessment: Needs assistance Sitting-balance support: No upper extremity supported, Feet unsupported Sitting balance-Leahy Scale: Zero                                     ADL either performed or assessed with clinical judgement   ADL Overall ADL's : Needs assistance/impaired                                       General ADL Comments: TOTAL A x2 toileting at bed level - hand over hand placement of R arm on bed rail, does not pull on to assist with rolling      Cognition Arousal/Alertness: Lethargic Behavior During Therapy: Flat affect Overall Cognitive Status: No family/caregiver present to determine baseline cognitive functioning                                 General Comments: states she wants to get back  to moving but does not participate in mobility                   Pertinent Vitals/ Pain       Pain Assessment Pain Assessment: Faces Faces Pain Scale: Hurts little more Pain Location: legs with mobility Pain Descriptors / Indicators: Discomfort Pain Intervention(s): Limited activity within patient's tolerance, Repositioned   Frequency  Min 2X/week        Progress Toward Goals  OT Goals(current goals can now be found in the care plan section)  Progress towards OT goals: Not progressing toward goals - comment (will trial, if unable to participate will d/c from caseload)  Acute Rehab OT Goals Patient Stated Goal: to lay back down OT Goal Formulation: With patient Time For Goal Achievement:  10/10/21 Potential to Achieve Goals: Fair ADL Goals Pt Will Perform Grooming: with min guard assist;sitting Pt Will Perform Upper Body Dressing: with min guard assist;sitting Pt Will Perform Lower Body Dressing: with mod assist;sitting/lateral leans;with adaptive equipment  Plan Discharge plan remains appropriate;Frequency remains appropriate    Co-evaluation    PT/OT/SLP Co-Evaluation/Treatment: Yes Reason for Co-Treatment: Necessary to address cognition/behavior during functional activity;For patient/therapist safety;To address functional/ADL transfers PT goals addressed during session: Mobility/safety with mobility OT goals addressed during session: ADL's and self-care      AM-PAC OT "6 Clicks" Daily Activity     Outcome Measure   Help from another person eating meals?: A Little Help from another person taking care of personal grooming?: A Lot Help from another person toileting, which includes using toliet, bedpan, or urinal?: Total Help from another person bathing (including washing, rinsing, drying)?: A Lot Help from another person to put on and taking off regular upper body clothing?: A Little Help from another person to put on and taking off regular lower body clothing?: Total 6 Click Score: 12    End of Session    OT Visit Diagnosis: Unsteadiness on feet (R26.81);Muscle weakness (generalized) (M62.81);Other symptoms and signs involving cognitive function   Activity Tolerance Patient limited by fatigue   Patient Left in bed;with call bell/phone within reach   Nurse Communication          Time: 1410-1438 OT Time Calculation (min): 28 min  Charges: OT General Charges $OT Visit: 1 Visit OT Treatments $Self Care/Home Management : 8-22 mins  Dessie Coma, M.S. OTR/L  10/04/21, 3:01 PM  ascom [phone]

## 2021-10-04 NOTE — Progress Notes (Signed)
Pt refused CBG monitoring and Insulin. NP, K. Foust made aware

## 2021-10-04 NOTE — Progress Notes (Signed)
Pt slept most of this shift, waking up only during pt care, woke once yelling out that she needed to be repositioned this afternoon.  Pt took meds without difficulty, appetite poor, no complaints

## 2021-10-04 NOTE — TOC Progression Note (Signed)
Transition of Care [facility]) - Progression Note    Patient Details  Name: Judith Shaw MRN: [id number] Date of Birth: 1950-09-15  [facility] CM/SW Contact  Anselm Pancoast, RN Phone Number: 10/04/2021, 3:27 PM  Clinical Narrative:    Spoke with sister regarding no new current bed offers. Has 2 bed offers in [place] areas but would require insurance approval and current therapy reccomendations are for LTC. Sister has spoken with Andee Poles @ [facility] and is discussing other options for discharge placement including possible ALF or family care homes. Has follow up meeting with Danielle later today.    Expected Discharge Plan: [place] Barriers to Discharge: Continued Medical Work up  Expected Discharge Plan and Services Expected Discharge Plan: [place] Choice: [place] ([facility]AC) Living arrangements for the past 2 months: [place]                                       Social Determinants of Health (SDOH) Interventions    Readmission Risk Interventions Readmission Risk Prevention Plan 08/12/2021  Transportation Screening Complete  PCP or Specialist Appt within 3-5 Days Complete  HRI or [place] Complete  Social Work Consult for [place] Planning/Counseling Complete  Palliative Care Screening Not Applicable  Medication Review [employer]) Complete  Some recent data might be hidden

## 2021-10-05 DIAGNOSIS — G9340 Encephalopathy, unspecified: Secondary | ICD-10-CM | POA: Diagnosis not present

## 2021-10-05 DIAGNOSIS — I5032 Chronic diastolic (congestive) heart failure: Secondary | ICD-10-CM | POA: Diagnosis not present

## 2021-10-05 DIAGNOSIS — F0390 Unspecified dementia without behavioral disturbance: Secondary | ICD-10-CM | POA: Diagnosis not present

## 2021-10-05 DIAGNOSIS — I2699 Other pulmonary embolism without acute cor pulmonale: Secondary | ICD-10-CM | POA: Diagnosis not present

## 2021-10-05 LAB — GLUCOSE, CAPILLARY
Glucose-Capillary: 131 mg/dL — ABNORMAL HIGH (ref 70–99)
Glucose-Capillary: 199 mg/dL — ABNORMAL HIGH (ref 70–99)
Glucose-Capillary: 247 mg/dL — ABNORMAL HIGH (ref 70–99)

## 2021-10-05 NOTE — Progress Notes (Signed)
Physical Therapy Treatment Patient Details Name: Judith Shaw MRN: [id number] DOB: 1951/06/06 Today's Date: 10/05/2021   History of Present Illness Pt is a 71 y/o F admitted on 09/23/21 who presented to the ED from SNF after pt had a brief episode of LOC. MRI was negative for any acute changes. Pt with elevated d-dimer, CT angiogram chest showed bilateral PE. Pt was started on IV heparin. PMH: DM2, HLD, depression, sleep apnea noncompliant with CPAP, hysterectomy for uterine CA.    PT Comments    Pt/OT co treat 2/2 to pt requiring +2 assistance poor overall activity tolerance. Pt was more alert overall and did give good effort during session with constant encouragement. She performed rolling in bed with max assist.however required +2 max assist to achieve EOB short sit. Pt sat EOB x ~ 25-30 minute while performing there ex and stretching. Focused of stretching cervical spine, BLEs, chest, and UEs. Pt present with increased tightness from previous sessions observed. She will need 124/7 assistance at DC. Long term placement is best option for pt due to assistance required for all ADLs. Acute PT will continue to follow and progress as able per current POC.     Recommendations for follow up therapy are one component of a multi-disciplinary discharge planning process, led by the attending physician.  Recommendations may be updated based on patient status, additional functional criteria and insurance authorization.  Follow Up Recommendations  Long-term institutional care without follow-up therapy     Assistance Recommended at Discharge Frequent or constant Supervision/Assistance  Patient can return home with the following Two people to help with walking and/or transfers;Two people to help with bathing/dressing/bathroom;Direct supervision/assist for medications management;Help with stairs or ramp for entrance;Assist for transportation;Direct supervision/assist for financial management;Assistance with  cooking/housework   Equipment Recommendations  None recommended by PT       Precautions / Restrictions Precautions Precautions: Fall Restrictions Weight Bearing Restrictions: No     Mobility  Bed Mobility Overal bed mobility: Needs Assistance Bed Mobility: Rolling, Supine to Sit, Sit to Supine Rolling: Max assist, +2 for safety/equipment   Supine to sit: Max assist, Total assist, +2 for physical assistance, +2 for safety/equipment Sit to supine: Total assist, +2 for physical assistance, +2 for safety/equipment, Max assist   General bed mobility comments: Max assist for one to roll however +2 max assist to safely achieve EOB short sit and to return to supine from EOB. Pt sat EOB x ~ 30 minutes    Transfers    General transfer comment: pt will require mechanical lift for all transfers. todays session focused on sitting balance, stretching, and improving activity tolerance    Ambulation/Gait  General Gait Details: Pt was non ambulatory at baseline   Balance Overall balance assessment: Needs assistance Sitting-balance support: No upper extremity supported, Feet unsupported Sitting balance-Leahy Scale: Poor       Cognition Arousal/Alertness: Awake/alert (much more alert than previously observed) Behavior During Therapy: Flat affect Overall Cognitive Status: No family/caregiver present to determine baseline cognitive functioning      Following Commands: Follows one step commands inconsistently, Follows one step commands with increased time Safety/Judgement: Decreased awareness of deficits, Decreased awareness of safety Awareness: Intellectual Problem Solving: Slow processing, Requires verbal cues, Requires tactile cues General Comments: Pt was more alert today and agreeable to PT/OT cotreat. pt does fully participate throughout session           General Comments General comments (skin integrity, edema, etc.): Alot of time educating pt's caregiver on specialty  bed  functions, need for AAROM/PROM and overall improving activity tolerance. Pt will need to perform more activity even in bed to promote any functional return.      Pertinent Vitals/Pain Pain Assessment Pain Assessment: 0-10 Pain Score: 4  Faces Pain Scale: Hurts a little bit Pain Location: lower back and legs with mobility Pain Descriptors / Indicators: Discomfort Pain Intervention(s): Limited activity within patient's tolerance, Monitored during session, Premedicated before session, Repositioned     PT Goals (current goals can now be found in the care plan section) Acute Rehab PT Goals Patient Stated Goal: none stated Progress towards PT goals: Progressing toward goals    Frequency    Min 2X/week      PT Plan Current plan remains appropriate    Co-evaluation PT/OT/SLP Co-Evaluation/Treatment: Yes Reason for Co-Treatment: Complexity of the patient's impairments (multi-system involvement);Necessary to address cognition/behavior during functional activity;For patient/therapist safety;To address functional/ADL transfers PT goals addressed during session: Mobility/safety with mobility;Balance;Proper use of DME;Strengthening/ROM        AM-PAC PT "6 Clicks" Mobility   Outcome Measure  Help needed turning from your back to your side while in a flat bed without using bedrails?: Total Help needed moving from lying on your back to sitting on the side of a flat bed without using bedrails?: Total Help needed moving to and from a bed to a chair (including a wheelchair)?: Total Help needed standing up from a chair using your arms (e.g., wheelchair or bedside chair)?: Total Help needed to walk in hospital room?: Total Help needed climbing 3-5 steps with a railing? : Total 6 Click Score: 6    End of Session Equipment Utilized During Treatment: Gait belt Activity Tolerance: Patient limited by fatigue Patient left: in bed;with call bell/phone within reach Nurse Communication: Mobility  status PT Visit Diagnosis: Muscle weakness (generalized) (M62.81);Difficulty in walking, not elsewhere classified (R26.2)     Time: 1245-8099 PT Time Calculation (min) (ACUTE ONLY): 38 min  Charges:  $Therapeutic Activity: 8-22 mins                    Julaine Fusi PTA 10/05/21, 4:52 PM

## 2021-10-05 NOTE — Progress Notes (Signed)
PROGRESS NOTE    Judith Shaw  CHE:[id number] DOB: Sep 03, 1950 DOA: 09/23/2021 PCP: Pcp, No    Brief Narrative:  Judith Shaw is a 71 y.o. female with history of diabetes mellitus type 2, hyperlipidemia, depression, sleep apnea noncompliant with CPAP prior history of hysterectomy for uterine CA was brought to the ER from skilled nursing facility after patient had a brief episode of loss of consciousness. MRI brain did not show any acute changes. She has elevated D-dimer, CT angiogram chest showed bilateral PE.  She is started on IV heparin.  Switched to Eliquis on 1/29.   Assessment & Plan:   Principal Problem:   Acute encephalopathy Active Problems:   Diabetes (HCC)   Chronic diastolic CHF (congestive heart failure) (HCC)   Benign essential HTN   Dementia without behavioral disturbance (HCC)   Syncope   Lethargy   Pressure injury of skin   Bilateral pulmonary embolism (HCC)   Bilateral pulm emboli. Continue anticoagulation with Eliquis.  Acute metabolic encephalopathy, resolved cont Seroquel 50 mg nightly  Syncope and hypotension Appears to be secondary to PE. Condition has improved.   Generalized weakness. Frequent falls Pending SNF placement.   Klebsiella pneumonia UTI. --s/p a dose of fosfomycin, treated.   Morbid obesity. Patient with a BMI of 38, with associated chronic diastolic congestive heart failure, hypertension and type 2 diabetes.   Type 2 diabetes. --cont glargine 15u nightly --SSI   Essential hypertension --not on BP med PTA --monitor   Pressure ulcer POA Pressure Injury 09/23/21 Coccyx Stage 2 -  Partial thickness loss of dermis presenting as a shallow open injury with a red, pink wound bed without slough. (Active)  09/23/21 1919  Location: Coccyx  Location Orientation:   Staging: Stage 2 -  Partial thickness loss of dermis presenting as a shallow open injury with a red, pink wound bed without slough.  Wound Description (Comments):    Present on Admission: Yes     DVT prophylaxis: Eliquis Code Status: full Family Communication:  Disposition Plan:    Status is: Inpatient Remains inpatient appropriate because: Unsafe discharge.   I/O last 3 completed shifts: In: 0  Out: 600 [Urine:600] No intake/output data recorded.     Consultants:  None  Procedures: None  Antimicrobials:None  Subjective: Patient has no complaints today, she wants to go home. Denies any short of breath or cough.  Objective: Vitals:   10/04/21 2000 10/05/21 0456 10/05/21 0754 10/05/21 1144  BP: (!) 107/59 91/60 113/72 (!) 105/57  Pulse: 64 73 72 80  Resp: 18 18 18 20   Temp: 98.2 F (36.8 C) 98.1 F (36.7 C) 97.7 F (36.5 C) 98.1 F (36.7 C)  TempSrc:  Oral Oral Oral  SpO2: 94% 94% 99% 96%  Weight:      Height:        Intake/Output Summary (Last 24 hours) at 10/05/2021 1347 Last data filed at 10/05/2021 0458 Gross per 24 hour  Intake 0 ml  Output 300 ml  Net -300 ml   Filed Weights   09/24/21 0532 09/27/21 1102  Weight: 98.1 kg 98.1 kg    Examination:  General exam: Appears calm and comfortable  Respiratory system: Clear to auscultation. Respiratory effort normal. Cardiovascular system: S1 & S2 heard, RRR. No JVD, murmurs, rubs, gallops or clicks. No pedal edema. Gastrointestinal system: Abdomen is nondistended, soft and nontender. No organomegaly or masses felt. Normal bowel sounds heard. Central nervous system: Alert and oriented x2. No focal neurological deficits. Extremities: Symmetric 5  x 5 power. Skin: No rashes, lesions or ulcers Psychiatry: Judgement and insight appear normal. Mood & affect appropriate.     Data Reviewed: I have personally reviewed following labs and imaging studies  CBC: Recent Labs  Lab 09/29/21 0721 09/30/21 0518 10/04/21 1129  WBC 8.4 8.0 6.7  HGB 15.6* 16.1* 15.0  HCT 46.3* 51.9* 46.2*  MCV 87.2 91.7 89.9  PLT 252 219 924   Basic Metabolic Panel: Recent Labs  Lab  09/29/21 0721 09/30/21 0518 10/04/21 1129  NA 135 134* 139  K 4.3 4.2 4.4  CL 105 106 108  CO2 22 19* 25  GLUCOSE 145* 158* 156*  BUN 37* 36* 34*  CREATININE 1.06* 0.99 0.92  CALCIUM 9.0 9.0 9.3  MG 1.5* 1.7 1.7   GFR: Estimated Creatinine Clearance: 62.6 mL/min (by C-G formula based on SCr of 0.92 mg/dL). Liver Function Tests: No results for input(s): AST, ALT, ALKPHOS, BILITOT, PROT, ALBUMIN in the last 168 hours. No results for input(s): LIPASE, AMYLASE in the last 168 hours. No results for input(s): AMMONIA in the last 168 hours. Coagulation Profile: No results for input(s): INR, PROTIME in the last 168 hours. Cardiac Enzymes: No results for input(s): CKTOTAL, CKMB, CKMBINDEX, TROPONINI in the last 168 hours. BNP (last 3 results) No results for input(s): PROBNP in the last 8760 hours. HbA1C: No results for input(s): HGBA1C in the last 72 hours. CBG: Recent Labs  Lab 10/04/21 1215 10/04/21 1630 10/04/21 2100 10/05/21 0812 10/05/21 1136  GLUCAP 145* 151* 144* 131* 199*   Lipid Profile: No results for input(s): CHOL, HDL, LDLCALC, TRIG, CHOLHDL, LDLDIRECT in the last 72 hours. Thyroid Function Tests: No results for input(s): TSH, T4TOTAL, FREET4, T3FREE, THYROIDAB in the last 72 hours. Anemia Panel: No results for input(s): VITAMINB12, FOLATE, FERRITIN, TIBC, IRON, RETICCTPCT in the last 72 hours. Sepsis Labs: No results for input(s): PROCALCITON, LATICACIDVEN in the last 168 hours.  No results found for this or any previous visit (from the past 240 hour(s)).       Radiology Studies: No results found.      Scheduled Meds:  apixaban  5 mg Oral BID   ARIPiprazole  5 mg Oral Daily   busPIRone  10 mg Oral TID   donepezil  10 mg Oral QHS   gabapentin  300 mg Oral QHS   insulin aspart  0-5 Units Subcutaneous QHS   insulin aspart  0-9 Units Subcutaneous TID WC   insulin glargine-yfgn  15 Units Subcutaneous QHS   ketorolac  15 mg Intravenous Once    LORazepam  0.5 mg Oral Q8H   QUEtiapine  75 mg Oral QHS   Continuous Infusions:   LOS: 11 days    Time spent: 27 minutes    Sharen Hones, MD [facility]   To contact the attending provider between 7A-7P or the covering provider during after hours 7P-7A, please log into the web site [email or web address] and access using universal [place] password for that web site. If you do not have the password, please call the hospital operator.  10/05/2021, 1:47 PM

## 2021-10-05 NOTE — TOC Progression Note (Addendum)
Transition of Care [facility]) - Progression Note    Patient Details  Name: TALEAH BELLANTONI MRN: [id number] Date of Birth: 1951/05/31  [facility] CM/SW Contact  Anselm Pancoast, RN Phone Number: 10/05/2021, 10:06 AM  Clinical Narrative:    Spoke with sister who toured some [place] family care homes yesterday however had some concerns but is touring [place] area today with [employer] suggestions. Sister is now out of quarantine and looking forward to being able to come see patient. Sister, Lucille Passy remains very involved in care and is actively assisting with search for placement.  RN CM encouraged sister to stay updated via Mychart as well and discussed facilities having limited access to patient chart for evaluations. Discussed these items being mostly therapy notes and progress notes. Reviewed limited number of LTC beds and ability to be selective of patients. PT recommendations remain LTC placement.    Expected Discharge Plan: [place] Barriers to Discharge: Continued Medical Work up  Expected Discharge Plan and Services Expected Discharge Plan: [place] Choice: [place] (LTAC) Living arrangements for the past 2 months: [place]                                       Social Determinants of Health (SDOH) Interventions    Readmission Risk Interventions Readmission Risk Prevention Plan 08/12/2021  Transportation Screening Complete  PCP or Specialist Appt within 3-5 Days Complete  HRI or [place] Complete  Social Work Consult for [place] Planning/Counseling Complete  Palliative Care Screening Not Applicable  Medication Review [employer]) Complete  Some recent data might be hidden

## 2021-10-05 NOTE — Progress Notes (Signed)
Pt sleeping on and off through the shift. Yelling out that she needs to be repositioned every time she wakes up. Patient maybe slept a total of 5 hours through the night. Took medications without difficulty.

## 2021-10-05 NOTE — Progress Notes (Signed)
Occupational Therapy Treatment Patient Details Name: Judith Shaw MRN: [id number] DOB: [birth year] Today's Date: 10/05/2021   History of present illness Pt is a 71 y/o F admitted on 09/23/21 who presented to the ED from SNF after pt had a brief episode of LOC. MRI was negative for any acute changes. Pt with elevated d-dimer, CT angiogram chest showed bilateral PE. Pt was started on IV heparin. PMH: DM2, HLD, depression, sleep apnea noncompliant with CPAP, hysterectomy for uterine CA.   OT comments  Pt seen for OT Tx this date to f/u re: safety with ADLs/ADL mobility. CO-tx completed with PT d/t limited fxl activity tolerance. Pt requires MAX/TOTAL A +2 for bed mobility including sup<>sit. She does, when cued, make good effort to engage core for short bouts of F sitting balance, but primarily requires at least MIN A to sustain static sitting, ultimately resulting in POOR static sitting balance. She seemingly performs/sustains attn a little better when her personal caregiver is present and encouraging her. She is noted to be somewhat less responsive when coming to sitting which is likely 2/2 BP (not taken, but pt with hx of BP dropping in sitting per her aide, and pt requires ~2-3 mins seated recovery with ankle pumps to bring energy and attn level up). She is returned to bed and requires MAX A +2 for repositioning, but is engaged in participation with cues to reach for bed rails. She is left with all needs met and in reach, with size-wise bed set to alternate positioning for skin. Will continue to follow, continue to anticipate she will require LTC. Even just addition of second [place] care aide would help meet pt needs for repositioning to prevent skin breakdown and stimuli to prevent atrophy.    Recommendations for follow up therapy are one component of a multi-disciplinary discharge planning process, led by the attending physician.  Recommendations may be updated based on patient status, additional functional  criteria and insurance authorization.    Follow Up Recommendations  Long-term institutional care without follow-up therapy    Assistance Recommended at Discharge Frequent or constant Supervision/Assistance  Patient can return home with the following  Two people to help with walking and/or transfers;Two people to help with bathing/dressing/bathroom   Equipment Recommendations  Other (comment) (defer)    Recommendations for Other Services      Precautions / Restrictions Precautions Precautions: Fall Restrictions Weight Bearing Restrictions: No       Mobility Bed Mobility Overal bed mobility: Needs Assistance Bed Mobility: Rolling, Supine to Sit, Sit to Supine Rolling: Max assist, +2 for safety/equipment   Supine to sit: Max assist, Total assist, +2 for physical assistance, +2 for safety/equipment Sit to supine: Total assist, +2 for physical assistance, +2 for safety/equipment, Max assist   General bed mobility comments: Max assist for one to roll however +2 max assist to safely achieve EOB short sit and to return to supine from EOB. Pt sat EOB x ~ 30 minutes    Transfers                   General transfer comment: pt will require mechanical lift for all transfers. todays session focused on sitting balance, stretching, and improving activity tolerance     Balance Overall balance assessment: Needs assistance Sitting-balance support: No upper extremity supported, Feet unsupported Sitting balance-Leahy Scale: Poor Sitting balance - Comments: requires at least one person assist to sustain static sitting balance, benefits from UE support on bed rails as well. Encouraged to engage  core and has short-lived (~2-3 second) bouts of tolerating close stand-by fair balance                                   ADL either performed or assessed with clinical judgement   ADL                                              Extremity/Trunk Assessment               Vision       Perception     Praxis      Cognition Arousal/Alertness: Awake/alert Behavior During Therapy: Flat affect Overall Cognitive Status: No family/caregiver present to determine baseline cognitive functioning Area of Impairment: Orientation, Memory, Attention, Safety/judgement, Awareness, Problem solving, Following commands                 Orientation Level: Disoriented to, Place, Time, Situation   Memory: Decreased recall of precautions, Decreased short-term memory Following Commands: Follows one step commands inconsistently, Follows one step commands with increased time Safety/Judgement: Decreased awareness of deficits, Decreased awareness of safety Awareness: Intellectual Problem Solving: Slow processing, Requires verbal cues, Requires tactile cues General Comments: Pt was more alert today and agreeable to PT/OT cotreat. pt does fully participate throughout session, seemingly more successful when her personal aide is present        Exercises Other Exercises Other Exercises: OT engages pt in scap retraction/postural extension to reduce risk of atrohpy of neck/posterior chain muscles. Requires moderate cues to stay engaged.    Shoulder Instructions       General Comments Alot of time educating pt's caregiver on specialty bed functions, need for AAROM/PROM and overall improving activity tolerance. Pt will need to perform more activity even in bed to promote any functional return.    Pertinent Vitals/ Pain       Pain Assessment Pain Assessment: Faces Faces Pain Scale: Hurts a little bit Pain Location: lower back and legs with mobility Pain Descriptors / Indicators: Discomfort Pain Intervention(s): Limited activity within patient's tolerance, Monitored during session  Home Living                                          Prior Functioning/Environment              Frequency  Min 2X/week        Progress Toward  Goals  OT Goals(current goals can now be found in the care plan section)  Progress towards OT goals: Progressing toward goals (somewhat more participatory today)  Acute Rehab OT Goals Patient Stated Goal: to maintain strength OT Goal Formulation: With patient/family (pt/caregiver) Time For Goal Achievement: 10/10/21 Potential to Achieve Goals: [place] Discharge plan remains appropriate;Frequency remains appropriate    Co-evaluation    PT/OT/SLP Co-Evaluation/Treatment: Yes Reason for Co-Treatment: Complexity of the patient's impairments (multi-system involvement);For patient/therapist safety;To address functional/ADL transfers PT goals addressed during session: Mobility/safety with mobility OT goals addressed during session: ADL's and self-care      AM-PAC OT "6 Clicks" Daily Activity     Outcome Measure   Help from another person eating meals?: A Little Help from another person taking care of personal  grooming?: A Lot Help from another person toileting, which includes using toliet, bedpan, or urinal?: Total Help from another person bathing (including washing, rinsing, drying)?: A Lot Help from another person to put on and taking off regular upper body clothing?: A Little Help from another person to put on and taking off regular lower body clothing?: Total 6 Click Score: 12    End of Session    OT Visit Diagnosis: Unsteadiness on feet (R26.81);Muscle weakness (generalized) (M62.81);Other symptoms and signs involving cognitive function   Activity Tolerance Patient limited by fatigue   Patient Left in bed;with call bell/phone within reach   Nurse Communication          Time: 1458-1536 OT Time Calculation (min): 38 min  Charges: OT General Charges $OT Visit: 1 Visit OT Treatments $Therapeutic Activity: 8-22 mins $Therapeutic Exercise: 8-22 mins  Gerrianne Scale, [place], OTR/L ascom [phone] 10/05/21, 5:35 PM

## 2021-10-06 DIAGNOSIS — I10 Essential (primary) hypertension: Secondary | ICD-10-CM | POA: Diagnosis not present

## 2021-10-06 DIAGNOSIS — G9340 Encephalopathy, unspecified: Secondary | ICD-10-CM | POA: Diagnosis not present

## 2021-10-06 DIAGNOSIS — I5032 Chronic diastolic (congestive) heart failure: Secondary | ICD-10-CM | POA: Diagnosis not present

## 2021-10-06 LAB — GLUCOSE, CAPILLARY
Glucose-Capillary: 138 mg/dL — ABNORMAL HIGH (ref 70–99)
Glucose-Capillary: 164 mg/dL — ABNORMAL HIGH (ref 70–99)
Glucose-Capillary: 243 mg/dL — ABNORMAL HIGH (ref 70–99)
Glucose-Capillary: 250 mg/dL — ABNORMAL HIGH (ref 70–99)

## 2021-10-06 MED ORDER — LORAZEPAM 0.5 MG PO TABS
0.5000 mg | ORAL_TABLET | Freq: Three times a day (TID) | ORAL | Status: DC | PRN
  Administered 2021-10-07 – 2021-10-20 (×13): 0.5 mg via ORAL
  Filled 2021-10-06 (×16): qty 1

## 2021-10-06 NOTE — Progress Notes (Signed)
Occupational Therapy Treatment Patient Details Name: Judith Shaw MRN: [id number] DOB: [birth year] Today's Date: 10/06/2021   History of present illness Pt is a 71 y/o F admitted on 09/23/21 who presented to the ED from SNF after pt had a brief episode of LOC. MRI was negative for any acute changes. Pt with elevated d-dimer, CT angiogram chest showed bilateral PE. Pt was started on IV heparin. PMH: DM2, HLD, depression, sleep apnea noncompliant with CPAP, hysterectomy for uterine CA.   OT comments  Pt calling out when OT presents to room for treatment. Pt is only interested in repositioning at this time despite efforts to encourage further participation. OT encourages pt to meaningfully participate in repositioning efforts and she is agreeable. She requires MAX A and tactile cues to use bed rails and reach with R arm across to L side in an effort to offload R side. In addition, OT engages pt in propulsion towards HOB while in trendelenburg with MAX A +2 with tactile cues to encourage pt to engage in bending knees to push and reaching towards HOB to use bed rails to pull. Pt does give some good effort to contribute to task. Bed is re-set at lowest position with the air mattress set to assist with this rotation onto L side. Pt left with all needs met and in reach.    Recommendations for follow up therapy are one component of a multi-disciplinary discharge planning process, led by the attending physician.  Recommendations may be updated based on patient status, additional functional criteria and insurance authorization.    Follow Up Recommendations  Long-term institutional care without follow-up therapy    Assistance Recommended at Discharge Frequent or constant Supervision/Assistance  Patient can return home with the following  Two people to help with walking and/or transfers;Two people to help with bathing/dressing/bathroom   Equipment Recommendations  Other (comment) (defer)    Recommendations for  Other Services      Precautions / Restrictions Precautions Precautions: Fall Restrictions Weight Bearing Restrictions: No       Mobility Bed Mobility Overal bed mobility: Needs Assistance Bed Mobility: Rolling Rolling: Max assist         General bed mobility comments: OT engages pt in contralateral reaching with R hand toward L bed rail to engage pt in repositioning as she'd like to lay on her L side. In addition. OT engages pt in propulsion towards HOB with MAX A +2, tactile cues required to engage pt in bending her knees and reaching towards [place] while in trendelenburg, to meaningfully contribute to repositioning efforts.    Transfers                         Balance                                           ADL either performed or assessed with clinical judgement   ADL                                              Extremity/Trunk Assessment              Vision       Perception     Praxis      Cognition  Arousal/Alertness: Awake/alert Behavior During Therapy: Flat affect Overall Cognitive Status: Difficult to assess Area of Impairment: Orientation, Memory, Attention, Safety/judgement, Awareness, Problem solving, Following commands                 Orientation Level: Disoriented to, Place, Time, Situation   Memory: Decreased recall of precautions, Decreased short-term memory Following Commands: Follows one step commands inconsistently, Follows one step commands with increased time Safety/Judgement: Decreased awareness of deficits, Decreased awareness of safety Awareness: Intellectual Problem Solving: Slow processing, Requires verbal cues, Requires tactile cues General Comments: requires increased processing time for sequencing cues for repositioning.        Exercises Other Exercises Other Exercises: OT engages pt in repositioning.    Shoulder Instructions       General Comments      Pertinent  Vitals/ Pain       Pain Assessment Pain Assessment: Faces Faces Pain Scale: Hurts little more Pain Location: sacral area when laying flat on her back Pain Descriptors / Indicators: Discomfort Pain Intervention(s): Repositioned  Home Living                                          Prior Functioning/Environment              Frequency  Min 2X/week        Progress Toward Goals  OT Goals(current goals can now be found in the care plan section)  Progress towards OT goals: OT to reassess next treatment  Acute Rehab OT Goals Patient Stated Goal: to maintain strength OT Goal Formulation: With patient Time For Goal Achievement: 10/10/21 Potential to Achieve Goals: [place] Discharge plan remains appropriate;Frequency remains appropriate    Co-evaluation                 AM-PAC OT "6 Clicks" Daily Activity     Outcome Measure   Help from another person eating meals?: A Little Help from another person taking care of personal grooming?: A Lot Help from another person toileting, which includes using toliet, bedpan, or urinal?: Total Help from another person bathing (including washing, rinsing, drying)?: A Lot Help from another person to put on and taking off regular upper body clothing?: A Little Help from another person to put on and taking off regular lower body clothing?: Total 6 Click Score: 12    End of Session    OT Visit Diagnosis: Unsteadiness on feet (R26.81);Muscle weakness (generalized) (M62.81);Other symptoms and signs involving cognitive function   Activity Tolerance Patient limited by fatigue   Patient Left in bed;with call bell/phone within reach   Nurse Communication Other (comment) (rotated toward L side for pressure relief)        Time: 7614-7092 OT Time Calculation (min): 8 min  Charges: OT General Charges $OT Visit: 1 Visit OT Treatments $Therapeutic Activity: 8-22 mins  Gerrianne Scale, [place], OTR/L ascom  [phone] 10/06/21, 5:36 PM

## 2021-10-06 NOTE — Progress Notes (Addendum)
Physical Therapy Treatment Patient Details Name: Judith Shaw MRN: [id number] DOB: September 29, 1950 Today's Date: 10/06/2021   History of Present Illness Pt is a 71 y/o F admitted on 09/23/21 who presented to the ED from SNF after pt had a brief episode of LOC. MRI was negative for any acute changes. Pt with elevated d-dimer, CT angiogram chest showed bilateral PE. Pt was started on IV heparin. PMH: DM2, HLD, depression, sleep apnea noncompliant with CPAP, hysterectomy for uterine CA.    PT Comments    Patient was awake, alert, and participatory with PT today. She was agreeable to attempting to sit up on edge of bed. She was able to get to partial sitting position but unable to sit fully upright with only one person assistance and with specialty mattress. She participated with LE exercises in bed. She is fatigued with minimal activity. Recommend to continue PT to maximize independence and decrease caregiver burden.     Recommendations for follow up therapy are one component of a multi-disciplinary discharge planning process, led by the attending physician.  Recommendations may be updated based on patient status, additional functional criteria and insurance authorization.  Follow Up Recommendations  Long-term institutional care without follow-up therapy     Assistance Recommended at Discharge Frequent or constant Supervision/Assistance  Patient can return home with the following Two people to help with walking and/or transfers;Two people to help with bathing/dressing/bathroom;Direct supervision/assist for medications management;Help with stairs or ramp for entrance;Assist for transportation;Direct supervision/assist for financial management;Assistance with cooking/housework   Equipment Recommendations  None recommended by PT    Recommendations for Other Services       Precautions / Restrictions Precautions Precautions: Fall Restrictions Weight Bearing Restrictions: No     Mobility  Bed  Mobility Overal bed mobility: Needs Assistance Bed Mobility: Rolling, Sit to Supine, Supine to Sit Rolling: Max assist   Supine to sit: Max assist Sit to supine: Max assist   General bed mobility comments: patient was agreeable to attempt sitting up on edge of bed. maximal assistance to come to partial sitting position. she participated but was unable to come to full upright sitting position with only one person assistance on the specialty mattress. patient requested to be repositioned at end of session. assisted patient with rolling to the right for comfort    Transfers                        Ambulation/Gait                   Stairs             Wheelchair Mobility    Modified Rankin (Stroke Patients Only)       Balance                                            Cognition Arousal/Alertness: Awake/alert Behavior During Therapy: Flat affect Overall Cognitive Status: Difficult to assess                                 General Comments: patient is much more alert today. patient is able to participate with PT during exercise and mobility efforts        Exercises General Exercises - Lower Extremity Heel Slides: AAROM, Strengthening, Both, 10 reps, Supine  Hip ABduction/ADduction: AAROM, Strengthening, Both, 10 reps, Supine Straight Leg Raises: AAROM, Strengthening, Both, 10 reps, Supine Other Exercises Other Exercises: verbal cues for exercise technique. patient does participate with exercises    General Comments        Pertinent Vitals/Pain Pain Assessment Pain Assessment: 0-10 Faces Pain Scale: Hurts little more Pain Location: sacral area when laying flat on her back Pain Descriptors / Indicators: Discomfort Pain Intervention(s): Repositioned    Home Living                          Prior Function            PT Goals (current goals can now be found in the care plan section) Acute Rehab PT  Goals Patient Stated Goal: none stated PT Goal Formulation: With patient Time For Goal Achievement: 10/09/21 Potential to Achieve Goals: Poor Progress towards PT goals: Progressing toward goals    Frequency    Min 2X/week      PT Plan Current plan remains appropriate    Co-evaluation              AM-PAC PT "6 Clicks" Mobility   Outcome Measure  Help needed turning from your back to your side while in a flat bed without using bedrails?: Total Help needed moving from lying on your back to sitting on the side of a flat bed without using bedrails?: Total Help needed moving to and from a bed to a chair (including a wheelchair)?: Total Help needed standing up from a chair using your arms (e.g., wheelchair or bedside chair)?: Total Help needed to walk in hospital room?: Total Help needed climbing 3-5 steps with a railing? : Total 6 Click Score: 6    End of Session   Activity Tolerance: Patient limited by fatigue Patient left: in bed;with call bell/phone within reach (personal caregiver in the room) Nurse Communication: Mobility status PT Visit Diagnosis: Muscle weakness (generalized) (M62.81);Difficulty in walking, not elsewhere classified (R26.2)     Time: 1347-1410 PT Time Calculation (min) (ACUTE ONLY): 23 min  Charges:  $Therapeutic Exercise: 8-22 mins $Therapeutic Activity: 8-22 mins                     Minna Merritts, PT, MPT    Percell Locus 10/06/2021, 2:36 PM

## 2021-10-06 NOTE — TOC Progression Note (Signed)
Transition of Care [facility]) - Progression Note    Patient Details  Name: Judith Shaw MRN: [id number] Date of Birth: [birth year]  Transition of Care [facility]) CM/SW [place], RN Phone Number: 10/06/2021, 11:00 AM  Clinical Narrative:    Faxed requested clinical to [facility] @ [phone] in order to set up assessments for potential placements.    Expected Discharge Plan: [place] Barriers to Discharge: Continued Medical Work up  Expected Discharge Plan and Services Expected Discharge Plan: [place] Choice: [place] (LTAC) Living arrangements for the past 2 months: [place]                                       Social Determinants of Health (SDOH) Interventions    Readmission Risk Interventions Readmission Risk Prevention Plan 08/12/2021  Transportation Screening Complete  PCP or Specialist Appt within 3-5 Days Complete  HRI or [place] Complete  Social Work Consult for [place] Planning/Counseling Complete  Palliative Care Screening Not Applicable  Medication Review Press photographer) Complete  Some recent data might be hidden

## 2021-10-06 NOTE — Progress Notes (Signed)
PROGRESS NOTE    Judith Shaw  PZW:[id number] DOB: Nov 05, 1950 DOA: 09/23/2021 PCP: Pcp, No    Brief Narrative:   Judith Shaw is a 71 y.o. female with history of diabetes mellitus type 2, hyperlipidemia, depression, sleep apnea noncompliant with CPAP prior history of hysterectomy for uterine CA was brought to the ER from skilled nursing facility after patient had a brief episode of loss of consciousness. MRI brain did not show any acute changes. She has elevated D-dimer, CT angiogram chest showed bilateral PE.  She is started on IV heparin.  Switched to Eliquis on 1/29.  Assessment & Plan:   Principal Problem:   Acute encephalopathy Active Problems:   Diabetes (HCC)   Chronic diastolic CHF (congestive heart failure) (HCC)   Benign essential HTN   Dementia without behavioral disturbance (HCC)   Syncope   Lethargy   Pressure injury of skin   Bilateral pulmonary embolism (HCC)   Bilateral pulm emboli. Acute metabolic encephalopathy, resolved Syncope and hypotension due to PE. Condition stable, not short of breath.  Continue Eliquis. continue Seroquel 50 mg nightly    Generalized weakness. Frequent falls Pending SNF placement.     Klebsiella pneumonia UTI. --s/p a dose of fosfomycin, treated.   Morbid obesity. Patient with a BMI of 38, with associated chronic diastolic congestive heart failure, hypertension and type 2 diabetes.   Type 2 diabetes. --cont glargine 15u nightly --SSI   Essential hypertension --not on BP med PTA --monitor   Pressure ulcer POA Pressure Injury 09/23/21 Coccyx Stage 2 -  Partial thickness loss of dermis presenting as a shallow open injury with a red, pink wound bed without slough. (Active)  09/23/21 1919  Location: Coccyx  Location Orientation:   Staging: Stage 2 -  Partial thickness loss of dermis presenting as a shallow open injury with a red, pink wound bed without slough.  Wound Description (Comments):   Present on Admission: Yes         DVT prophylaxis: Eliquis Code Status: full Family Communication:  Disposition Plan:      Status is: Inpatient Remains inpatient appropriate because: Unsafe discharge.       I/O last 3 completed shifts: In: 0  Out: 400 [Urine:400] No intake/output data recorded.     Subjective: Patient condition stable, currently pending for nursing placement.  She denies any abdominal pain nausea vomiting pain No short of breath or cough.  Objective: Vitals:   10/05/21 2102 10/06/21 0025 10/06/21 0510 10/06/21 0802  BP: (!) 153/58 108/81 97/78 122/69  Pulse: 81 79 (!) 57 (!) 59  Resp: 16 18 15 20   Temp: 98.5 F (36.9 C) 97.9 F (36.6 C) 98.1 F (36.7 C) 98 F (36.7 C)  TempSrc: Oral Oral Oral   SpO2: 92% 96% 94% 92%  Weight:      Height:        Intake/Output Summary (Last 24 hours) at 10/06/2021 1021 Last data filed at 10/05/2021 2102 Gross per 24 hour  Intake 0 ml  Output 100 ml  Net -100 ml   Filed Weights   09/24/21 0532 09/27/21 1102  Weight: 98.1 kg 98.1 kg    Examination:  General exam: Appears calm and comfortable  Respiratory system: Clear to auscultation. Respiratory effort normal. Cardiovascular system: S1 & S2 heard, RRR. No JVD, murmurs, rubs, gallops or clicks. No pedal edema. Gastrointestinal system: Abdomen is nondistended, soft and nontender. No organomegaly or masses felt. Normal bowel sounds heard. Central nervous system: Alert and oriented x2.  No focal neurological deficits. Extremities: Symmetric 5 x 5 power. Skin: No rashes, lesions or ulcers Psychiatry: Judgement and insight appear normal. Mood & affect appropriate.     Data Reviewed: I have personally reviewed following labs and imaging studies  CBC: Recent Labs  Lab 09/30/21 0518 10/04/21 1129  WBC 8.0 6.7  HGB 16.1* 15.0  HCT 51.9* 46.2*  MCV 91.7 89.9  PLT 219 237   Basic Metabolic Panel: Recent Labs  Lab 09/30/21 0518 10/04/21 1129  NA 134* 139  K 4.2 4.4  CL 106 108   CO2 19* 25  GLUCOSE 158* 156*  BUN 36* 34*  CREATININE 0.99 0.92  CALCIUM 9.0 9.3  MG 1.7 1.7   GFR: Estimated Creatinine Clearance: 62.6 mL/min (by C-G formula based on SCr of 0.92 mg/dL). Liver Function Tests: No results for input(s): AST, ALT, ALKPHOS, BILITOT, PROT, ALBUMIN in the last 168 hours. No results for input(s): LIPASE, AMYLASE in the last 168 hours. No results for input(s): AMMONIA in the last 168 hours. Coagulation Profile: No results for input(s): INR, PROTIME in the last 168 hours. Cardiac Enzymes: No results for input(s): CKTOTAL, CKMB, CKMBINDEX, TROPONINI in the last 168 hours. BNP (last 3 results) No results for input(s): PROBNP in the last 8760 hours. HbA1C: No results for input(s): HGBA1C in the last 72 hours. CBG: Recent Labs  Lab 10/04/21 2100 10/05/21 0812 10/05/21 1136 10/05/21 2147 10/06/21 0802  GLUCAP 144* 131* 199* 247* 138*   Lipid Profile: No results for input(s): CHOL, HDL, LDLCALC, TRIG, CHOLHDL, LDLDIRECT in the last 72 hours. Thyroid Function Tests: No results for input(s): TSH, T4TOTAL, FREET4, T3FREE, THYROIDAB in the last 72 hours. Anemia Panel: No results for input(s): VITAMINB12, FOLATE, FERRITIN, TIBC, IRON, RETICCTPCT in the last 72 hours. Sepsis Labs: No results for input(s): PROCALCITON, LATICACIDVEN in the last 168 hours.  No results found for this or any previous visit (from the past 240 hour(s)).       Radiology Studies: No results found.      Scheduled Meds:  apixaban  5 mg Oral BID   ARIPiprazole  5 mg Oral Daily   busPIRone  10 mg Oral TID   donepezil  10 mg Oral QHS   gabapentin  300 mg Oral QHS   insulin aspart  0-5 Units Subcutaneous QHS   insulin aspart  0-9 Units Subcutaneous TID WC   insulin glargine-yfgn  15 Units Subcutaneous QHS   ketorolac  15 mg Intravenous Once   LORazepam  0.5 mg Oral Q8H   QUEtiapine  75 mg Oral QHS   Continuous Infusions:   LOS: 12 days    Time spent: 22  minutes    Sharen Hones, MD [facility]   To contact the attending provider between 7A-7P or the covering provider during after hours 7P-7A, please log into the web site [email or web address] and access using universal [place] password for that web site. If you do not have the password, please call the hospital operator.  10/06/2021, 10:21 AM

## 2021-10-06 NOTE — TOC Progression Note (Addendum)
Transition of Care [facility]) - Progression Note    Patient Details  Name: Judith Shaw MRN: [id number] Date of Birth: May 21, 1951  Transition of [facility]) CM/SW Contact  Anselm Pancoast, RN Phone Number: 10/06/2021, 2:42 PM  Clinical Narrative:    Damaris Schooner to [facility] who reports [facility] has expressed concerns regarding patient being sleepy and difficult to engage during assessment. Facility reported that they were told this was caused by Ativan and facility is concerned about potential behaviors. Discussed possibility of sleepiness being related to sleep apnea or dementia and no concerning behaviors. Discussed with treatment team and MD agreed to decrease Ativan to monitor for more alertness. Second potential placement is in [place], [place] and will be reviewing for potential placement.   RN CM had detailed discussion with sister regarding placement issues and answered all questions. Sister requesting updated weight as she is concerned with patient history of aspiration. Family has provided sitter at bedside most days but is planning to discontinue the sitter to allow for more funding to assist with placement. Treatment team made aware of requests/concerns. Will continue to assist with placement.    Expected Discharge Plan: [place] Barriers to Discharge: Continued Medical Work up  Expected Discharge Plan and Services Expected Discharge Plan: [place] Choice: [place] (LTAC) Living arrangements for the past 2 months: [place]                                       Social Determinants of Health (SDOH) Interventions    Readmission Risk Interventions Readmission Risk Prevention Plan 08/12/2021  Transportation Screening Complete  PCP or Specialist Appt within 3-5 Days Complete  HRI or [place] Complete  Social Work Consult for [place] Planning/Counseling Complete  Palliative Care  Screening Not Applicable  Medication Review Press photographer) Complete  Some recent data might be hidden

## 2021-10-07 DIAGNOSIS — G9340 Encephalopathy, unspecified: Secondary | ICD-10-CM | POA: Diagnosis not present

## 2021-10-07 DIAGNOSIS — F418 Other specified anxiety disorders: Secondary | ICD-10-CM | POA: Diagnosis not present

## 2021-10-07 DIAGNOSIS — F0390 Unspecified dementia without behavioral disturbance: Secondary | ICD-10-CM | POA: Diagnosis not present

## 2021-10-07 DIAGNOSIS — I2699 Other pulmonary embolism without acute cor pulmonale: Secondary | ICD-10-CM | POA: Diagnosis not present

## 2021-10-07 LAB — BASIC METABOLIC PANEL
Anion gap: 9 (ref 5–15)
BUN: 36 mg/dL — ABNORMAL HIGH (ref 8–23)
CO2: 26 mmol/L (ref 22–32)
Calcium: 9.6 mg/dL (ref 8.9–10.3)
Chloride: 102 mmol/L (ref 98–111)
Creatinine, Ser: 1.06 mg/dL — ABNORMAL HIGH (ref 0.44–1.00)
GFR, Estimated: 56 mL/min — ABNORMAL LOW (ref >60)
Glucose, Bld: 244 mg/dL — ABNORMAL HIGH (ref 70–99)
Potassium: 6.1 mmol/L — ABNORMAL HIGH (ref 3.5–5.1)
Sodium: 137 mmol/L (ref 135–145)

## 2021-10-07 LAB — CBC WITH DIFFERENTIAL/PLATELET
Abs Immature Granulocytes: 0.02 10*3/uL (ref 0.00–0.07)
Basophils Absolute: 0 10*3/uL (ref 0.0–0.1)
Basophils Relative: 0 %
Eosinophils Absolute: 0.3 10*3/uL (ref 0.0–0.5)
Eosinophils Relative: 4 %
HCT: 48.1 % — ABNORMAL HIGH (ref 36.0–46.0)
Hemoglobin: 15.7 g/dL — ABNORMAL HIGH (ref 12.0–15.0)
Immature Granulocytes: 0 %
Lymphocytes Relative: 26 %
Lymphs Abs: 1.7 10*3/uL (ref 0.7–4.0)
MCH: 29.3 pg (ref 26.0–34.0)
MCHC: 32.6 g/dL (ref 30.0–36.0)
MCV: 89.9 fL (ref 80.0–100.0)
Monocytes Absolute: 0.6 10*3/uL (ref 0.1–1.0)
Monocytes Relative: 9 %
Neutro Abs: 4.1 10*3/uL (ref 1.7–7.7)
Neutrophils Relative %: 61 %
Platelets: 280 10*3/uL (ref 150–400)
RBC: 5.35 MIL/uL — ABNORMAL HIGH (ref 3.87–5.11)
RDW: 13.9 % (ref 11.5–15.5)
WBC: 6.8 10*3/uL (ref 4.0–10.5)
nRBC: 0 % (ref 0.0–0.2)

## 2021-10-07 LAB — GLUCOSE, CAPILLARY
Glucose-Capillary: 173 mg/dL — ABNORMAL HIGH (ref 70–99)
Glucose-Capillary: 222 mg/dL — ABNORMAL HIGH (ref 70–99)
Glucose-Capillary: 191 mg/dL — ABNORMAL HIGH (ref 70–99)
Glucose-Capillary: 265 mg/dL — ABNORMAL HIGH (ref 70–99)

## 2021-10-07 LAB — PHOSPHORUS: Phosphorus: 3.4 mg/dL (ref 2.5–4.6)

## 2021-10-07 LAB — MAGNESIUM: Magnesium: 1.9 mg/dL (ref 1.7–2.4)

## 2021-10-07 MED ORDER — ZINC SULFATE 220 (50 ZN) MG PO CAPS
220.0000 mg | ORAL_CAPSULE | Freq: Every day | ORAL | Status: AC
  Administered 2021-10-07 – 2021-10-20 (×14): 220 mg via ORAL
  Filled 2021-10-07 (×14): qty 1

## 2021-10-07 MED ORDER — ASCORBIC ACID 500 MG PO TABS
500.0000 mg | ORAL_TABLET | Freq: Two times a day (BID) | ORAL | Status: DC
  Administered 2021-10-07 – 2021-10-21 (×29): 500 mg via ORAL
  Filled 2021-10-07 (×29): qty 1

## 2021-10-07 MED ORDER — ADULT MULTIVITAMIN W/MINERALS CH
1.0000 | ORAL_TABLET | Freq: Every day | ORAL | Status: DC
  Administered 2021-10-07 – 2021-10-21 (×15): 1 via ORAL
  Filled 2021-10-07 (×15): qty 1

## 2021-10-07 MED ORDER — ENSURE MAX PROTEIN PO LIQD
11.0000 [oz_av] | Freq: Every day | ORAL | Status: DC
  Administered 2021-10-10 – 2021-10-20 (×6): 11 [oz_av] via ORAL
  Filled 2021-10-07: qty 330

## 2021-10-07 MED ORDER — GLUCERNA SHAKE PO LIQD
237.0000 mL | Freq: Two times a day (BID) | ORAL | Status: DC
  Administered 2021-10-07 – 2021-10-19 (×17): 237 mL via ORAL

## 2021-10-07 NOTE — Evaluation (Signed)
Clinical/Bedside Swallow Evaluation Patient Details  Name: Judith Shaw MRN: [id number] Date of Birth: 08-30-1950  Today's Date: 10/07/2021 Time: SLP Start Time (ACUTE ONLY): 0830 SLP Stop Time (ACUTE ONLY): 0930 SLP Time Calculation (min) (ACUTE ONLY): 60 min  Past Medical History:  Past Medical History:  Diagnosis Date   Anxiety    Cancer ([place])    pt states hx of uterine cancer and had a complete hyst   CHF (congestive heart failure) ([place])    Depression    Diabetes mellitus without complication ([place])    Hyperlipidemia    Hypertension    Past Surgical History:  Past Surgical History:  Procedure Laterality Date   ABDOMINAL HYSTERECTOMY     AMPUTATION Left 12/19/2019   Procedure: AMPUTATION RAY Left 5th;  Surgeon: Caroline More, DPM;  Location: [facility] ORS;  Service: Podiatry;  Laterality: Left;   CHOLECYSTECTOMY     LOWER EXTREMITY ANGIOGRAPHY Left 12/17/2019   Procedure: Lower Extremity Angiography;  Surgeon: Algernon Huxley, MD;  Location: [facility] LAB;  Service: Cardiovascular;  Laterality: Left;   HPI:  Per Physician's H&P "Judith Shaw is a 71 y.o. female with history of diabetes mellitus type 2, hyperlipidemia, depression, sleep apnea noncompliant with CPAP prior history of hysterectomy for uterine CA was brought to the ER from skilled nursing facility after patient had a brief episode of loss of consciousness.  As per the patient's sister patient has become more lethargic over the last 5 days.  Patient has been eating less.  Has been more bedbound and physical therapy has been trying to make her ambulate but finds it difficult.  2 days ago with some of her depression medications were changed her BuSpar dose was increased.  Patient has become more lethargic.  This morning patient was attempted to stand up when patient lost consciousness briefly and fell onto back to her bed.  Since then patient has become more lethargic.  No seizure-like activity was noted.  Patient has not  complained of any chest pain shortness of breath nausea vomiting or diarrhea.     ED Course: In the ER patient was found to be very sleepy MRI brain did not show anything acute.  Patient is following commands moving all extremities when patient woke up.  Labs are unremarkable.  Patient is afebrile.  ABG does not show any carbon dioxide retention.  Lactic acid is normal I sensitive troponins are negative.  COVID test negative.  Patient admitted for further observation.".     CT ANgio of Chest on 09/24/2021: Stable appearance of lower lobe atelectasis. No focal infiltrate  is seen.    Assessment / Plan / Recommendation  Clinical Impression  Pt appears to present w/ adequate oropharyngeal phase swallow function w/ No oropharyngeal phase dysphagia noted, No neuromuscular deficits noted. Pt consumed po trials w/ No overt, clinical s/s of aspiration during po trials. Pt appears at reduced risk for aspiration following general aspiration precautions and given setup support -- noted pt uses a Built-Up handled utensil, and she has mild UE/Hand tremors.  No overt clinical s/s of oropharyngeal phase noted during this assessment to indicate need for objective swallowing assessment at this time. Per MD note, "Clear to auscultation. Respiratory effort normal" at this time; reviewed last CT Angio of Chest w/ Stable appearance per report results. Pt is now more alert w/ the Ativan being discontinued per MD -- this may have been making pt too drowsy thus increasing risk for aspiration to occur. MD agreed.  HOWEVER, pt does require Full Positioning Support for head/body for Upright positioning; pt is weak and cannot fully help self w/ this. Positioning was given to pt by this SLP and staff prior to breakfast meal using multiple pillows behind back for forward positioning.  During po trials, pt consumed all consistencies w/ no overt coughing, decline in vocal quality, or change in respiratory presentation during/post trials.  Oral phase appeared [facility] w/ timely bolus management, mastication, and control of bolus propulsion for A-P transfer for swallowing. Oral clearing achieved w/ all trial consistencies. OM Exam appeared [facility] w/ no unilateral weakness noted. Speech Clear. Pt fed self w/ setup support.   Recommend continue a Regular consistency diet w/ well-Cut meats, moistened foods; Thin liquids. Recommend general aspiration precautions, Reflux precautions. Pills in a Puree for safer, easier swallowing as needed d/t lying in bed when swallowing. Education given to pt and NSG on Pills in Puree; food consistencies and easy to eat options; general aspiration precautions; and need for FULL support w/ Upright Positioning for all po's/meals. NSG to reconsult if any new needs arise. NSG agreed. MD updated.  SLP Visit Diagnosis: Dysphagia, unspecified (R13.10) (needs Positioning Support)    Aspiration Risk   (reduced following general precautions)    Diet Recommendation   Regular consistency diet w/ well-Cut meats, moistened foods; Thin liquids. Recommend general aspiration precautions, Reflux precautions. Setup and FULL support w/ Upright Positioning for all po's/meals. Built-up handle utensil.  Medication Administration: Whole meds with puree (IF needed for ease of swallowing)    Other  Recommendations Recommended Consults:  (Dietician f/u) Oral Care Recommendations: Oral care BID;Oral care before and after PO;Patient independent with oral care (setup support) Other Recommendations:  (n/a)    Recommendations for follow up therapy are one component of a multi-disciplinary discharge planning process, led by the attending physician.  Recommendations may be updated based on patient status, additional functional criteria and insurance authorization.  Follow up Recommendations No SLP follow up      Assistance Recommended at Discharge Set up Supervision/Assistance  Functional Status Assessment Patient has had a recent decline in  their functional status and demonstrates the ability to make significant improvements in function in a reasonable and predictable amount of time.  Frequency and Duration  (n/a)   (n/a)       Prognosis Prognosis for Safe Diet Advancement: Good Barriers to Reach Goals: Time post onset;Severity of deficits;Behavior Barriers/Prognosis Comment: needs full positioning support for Upright sitting for po's; setup support      Swallow Study   General Date of Onset: 09/23/21 HPI: Per Physician's H&P "Judith Shaw is a 71 y.o. female with history of diabetes mellitus type 2, hyperlipidemia, depression, sleep apnea noncompliant with CPAP prior history of hysterectomy for uterine CA was brought to the ER from skilled nursing facility after patient had a brief episode of loss of consciousness.  As per the patient's sister patient has become more lethargic over the last 5 days.  Patient has been eating less.  Has been more bedbound and physical therapy has been trying to make her ambulate but finds it difficult.  2 days ago with some of her depression medications were changed her BuSpar dose was increased.  Patient has become more lethargic.  This morning patient was attempted to stand up when patient lost consciousness briefly and fell onto back to her bed.  Since then patient has become more lethargic.  No seizure-like activity was noted.  Patient has not complained of any chest  pain shortness of breath nausea vomiting or diarrhea.     ED Course: In the ER patient was found to be very sleepy MRI brain did not show anything acute.  Patient is following commands moving all extremities when patient woke up.  Labs are unremarkable.  Patient is afebrile.  ABG does not show any carbon dioxide retention.  Lactic acid is normal I sensitive troponins are negative.  COVID test negative.  Patient admitted for further observation.".   CT ANgio of Chest on 09/24/2021: Stable appearance of lower lobe atelectasis. No focal  infiltrate  is seen. Type of Study: Bedside Swallow Evaluation Previous Swallow Assessment: 09/25/2021; also known to SLP services from previous admission in December 2022 - recommended mech soft/thin Diet Prior to this Study: Regular;Thin liquids Temperature Spikes Noted: No (wbc wnl) Respiratory Status: Room air History of Recent Intubation: No Behavior/Cognition: Alert;Cooperative;Pleasant mood;Distractible;Requires cueing (talkative) Oral Cavity Assessment: Within Functional Limits Oral Care Completed by SLP: Yes Oral Cavity - Dentition: Adequate natural dentition Vision: Functional for self-feeding Self-Feeding Abilities: Able to feed self;Needs set up;Needs assist (uses a built-up handle fork) Patient Positioning: Upright in bed (needed MAX positioning for head and body forward; large body habitus) Baseline Vocal Quality: Normal Volitional Cough: Strong Volitional Swallow: Able to elicit    Oral/Motor/Sensory Function Overall Oral Motor/Sensory Function: Within functional limits   Ice Chips Ice chips: Not tested   Thin Liquid Thin Liquid: Within functional limits Presentation: Self Fed;Cup;Straw (5 trials via Cup; ~3 ozs via Straw)    Nectar Thick Nectar Thick Liquid: Not tested   Honey Thick Honey Thick Liquid: Not tested   Puree Puree: Within functional limits Presentation: Self Fed;Spoon (10+ trials)   Solid     Solid: Within functional limits Presentation: Self Fed;Spoon (Whole Egg and cheese Omelette w/ Veggies)        Orinda Kenner, MS, CCC-SLP Speech Language Pathologist Rehab Services; [place] [phone] (ascom) Aela Bohan 10/07/2021,5:30 PM

## 2021-10-07 NOTE — Progress Notes (Signed)
Chaplain Maggie made initial visit per referral from Rox[place]. A prayer blanket/shawl was shared with patient who greatly appreciated the gift. Pt expressed her desire for the church congregation providing the blanket to know how much this expression of care meant to her. Pt shared a story of the impact of her youth group experience on her faith and how the youth minister became a foundational part of the pastoral care department at [facility]. Continued support available per on call chaplain.

## 2021-10-07 NOTE — Progress Notes (Signed)
Initial Nutrition Assessment  DOCUMENTATION CODES:   Non-severe (moderate) malnutrition in context of chronic illness, Obesity unspecified  INTERVENTION:   -500 mg vitamin C BID -220 mg zinc sulfate daily x 14 days -Feeding assistance with meals -Glucerna Shake po BID, each supplement provides 220 kcal and 10 grams of protein  -Ensure Max po daily, each supplement provides 150 kcal and 30 grams of protein -MVI with minerals daily -Liberalize diet to carb modified  NUTRITION DIAGNOSIS:   Moderate Malnutrition related to chronic illness (dementia) as evidenced by mild fat depletion, mild muscle depletion, percent weight loss.  GOAL:   Patient will meet greater than or equal to 90% of their needs  MONITOR:   PO intake, Supplement acceptance, Labs, Weight trends, Skin, I & O's  REASON FOR ASSESSMENT:   Low Braden    ASSESSMENT:   Judith Shaw is a 71 y.o. female with history of diabetes mellitus type 2, hyperlipidemia, depression, sleep apnea noncompliant with CPAP prior history of hysterectomy for uterine CA was brought to the ER from skilled nursing facility after patient had a brief episode of loss of consciousness.  As per the patient's sister patient has become more lethargic over the last 5 days.  Patient has been eating less.  Has been more bedbound and physical therapy has been trying to make her ambulate but finds it difficult.  2 days ago with some of her depression medications were changed her BuSpar dose was increased.  Patient has become more lethargic.  This morning patient was attempted to stand up when patient lost consciousness briefly and fell onto back to her bed.  Since then patient has become more lethargic.  No seizure-like activity was noted.  Patient has not complained of any chest pain shortness of breath nausea vomiting or diarrhea.  Pt admitted with acute encephalopathy, bilateral pulmonary emboli, and generalized weakness/ frequent falls.   Reviewed  I/O's: -1.7 L x 24 hours and -2.4 L since admission  UOP: 1.7 L x 24 hours   Spoke with pt and family members at bedside. All confirm that pt has experienced a general decline in health since September 2022 after being diagnosed with dementia. Pt's health has worsened over the past 3 months. Pt daughter reports decreased functional status, including limited ability to walk (has been bedbound since November 2022). Pt with very poor appetite and has been unable to feed herself with adaptive utensils and daughter reports concern about increased tremors. Pt is currently on a heart healthy/ carb modified diet with 0-50% meal completions.   Per daughter, pt has always been a large framed woman. She shares pictures with this RD from over the holidays last year and it appears that pt has lost a significant amount of weight. Reviewed wt hx; pt has experienced a 13.5% wt loss over the past 2 months, which is significant for time frame.   Had long discussion with pt and family regarding importance of nutrition to preserve lean body mass and wound healing. Discussed concern over loss of fat and muscle loss. Pt amenable to supplements. Will also liberalize diet to carb modified for increased variety of food selections.   Plan to d/c to SNF once bed available.   Lab Results  Component Value Date   HGBA1C 8.5 (H) 07/29/2021   PTA DM medications are sliding scale insulin aspart TID and 36 units tresiba daily.   Labs reviewed: CBGS: 131-250 (inpatient orders for glycemic control are 0-5 units insulin aspart daily at bedtime, 0-9  units insulin aspart TID with meals, and 15 units insulin glargine-yfgn daily).    NUTRITION - FOCUSED PHYSICAL EXAM:  Flowsheet Row Most Recent Value  Orbital Region No depletion  Upper Arm Region Mild depletion  Thoracic and Lumbar Region No depletion  Buccal Region Mild depletion  Temple Region Mild depletion  Clavicle Bone Region No depletion  Clavicle and Acromion Bone Region  No depletion  Scapular Bone Region No depletion  Dorsal Hand Mild depletion  Patellar Region Moderate depletion  Anterior Thigh Region Moderate depletion  Posterior Calf Region Moderate depletion  Edema (RD Assessment) Mild  Hair Reviewed  Eyes Reviewed  Mouth Reviewed  Skin Reviewed  Nails Reviewed       Diet Order:   Diet Order             Diet Carb Modified Fluid consistency: Thin; Room service appropriate? Yes  Diet effective now                   EDUCATION NEEDS:   Education needs have been addressed  Skin:  Skin Assessment: Skin Integrity Issues: Skin Integrity Issues:: Stage II Stage II: coccyx  Last BM:  10/06/21  Height:   Ht Readings from Last 1 Encounters:  09/27/21 5\' 3"  (1.6 m)    Weight:   Wt Readings from Last 1 Encounters:  09/27/21 98.1 kg    Ideal Body Weight:  52.3 kg  BMI:  Body mass index is 38.31 kg/m.  Estimated Nutritional Needs:   Kcal:  1850-2050  Protein:  115-130 grams  Fluid:  > 1.8 L    Loistine Chance, RD, LDN, [place] Registered Dietitian II Certified Diabetes Care and Education Specialist Please refer to [facility] for RD and/or RD on-call/weekend/after hours pager

## 2021-10-07 NOTE — TOC Progression Note (Signed)
Transition of Care [facility]) - Progression Note    Patient Details  Name: KAYLEY ZEIDERS MRN: [id number] Date of Birth: 26-Mar-1951  Transition [facility]) CM/SW Contact  Anselm Pancoast, RN Phone Number: 10/07/2021, 3:22 PM  Clinical Narrative:    Confirmed with Danielle, [facility] bed offer has been made to [facility] in [place], [place]. Sister is Physiological scientist tour tomorrow and will give final approval. TOC will outreach to [place] at [place] once confirmation received and begin preparing transport and any DME needs. Anticipating patient will need hospital bed and wheelchair. Probable need for transport other than EMS due to 1.5 hour drive to facility. Will set up on Monday for Tuesday transport.    Expected Discharge Plan: [place] Barriers to Discharge: Continued Medical Work up  Expected Discharge Plan and Services Expected Discharge Plan: [place] Choice: [place] (LTAC) Living arrangements for the past 2 months: [place]                                       Social Determinants of Health (SDOH) Interventions    Readmission Risk Interventions Readmission Risk Prevention Plan 08/12/2021  Transportation Screening Complete  PCP or Specialist Appt within 3-5 Days Complete  HRI or [place] Complete  Social Work Consult for [place] Planning/Counseling Complete  Palliative Care Screening Not Applicable  Medication Review Press photographer) Complete  Some recent data might be hidden

## 2021-10-07 NOTE — Consult Note (Signed)
[facility] Consult   Reason for Consult:  Anxiety and depression Referring Physician:  Zhang Patient Identification: Judith Shaw MRN:  [id number] Principal Diagnosis: Acute encephalopathy Diagnosis:  Principal Problem:   Acute encephalopathy Active Problems:   UTI due to Klebsiella species   Diabetes ([place])   Chronic diastolic CHF (congestive heart failure) (HCC)   Benign essential HTN   Dementia without behavioral disturbance ([place])   Syncope   Lethargy   Pressure injury of skin   Bilateral pulmonary embolism ([place])   Total Time spent with patient: 1 hour  Subjective:  "I guess whatever  medicine I am on is working because I am not depressed or anxious."  Judith Shaw is a 71 y.o. female patient admitted with bilateral PE after brief LOC at her SNF   HPI:  Patient seen and chart reviewed. Attending hospitalist requested consult last evening because patient appeared to be anxious, slurring words, sleepy. Ativan had been scheduled 0.5 mg 3 times daily, attending changed it to PRN.   On approach, patient was attempting to eat breakfast in awkward position. Speech therapist came in at that time and assisted patient to proper position and ordered new breakfast for patient as Probation officer proceeded to observe appropriate responses to speech therapist. After therapist stepped out, Probation officer engaged patient in conversation.  Patient is alert and oriented to self, time, place.  She is uncertain as to what exact reason she came to hospital. She knows that she was in SNF prior to coming to the hospital.  She reports feeling anxious when she came to the hospital because "I did not know how long I was going to have to be here."  She states that she feels very calm now.  Affect is congruent with stated mood of "my mood is good."  Patient states that she has a good life.  She has 7 brothers and sisters, several of whom live close and she has contact with them.  She reports taking in a foster  child when the child was 48 years old, and she feels that this is her child; they have remained in touch and she feels that she is a grandmother her children.   Patient enjoys reading, watching sports, talking to friends and family.  She admits that she "does not remember things like she used to for about the last 6 months."  Patient reports that she had a wonderful career as a Education officer, museum for [employer].  She has lived in this area her whole life.  Patient denies any current or past thoughts of self-harm.  Denies any self-injurious behaviors.  Denies homicidal ideation.  Negative for auditory or visual hallucinations.  Patient does not appear to be responding to internal stimuli.  She is clear and coherent, pleasant.  Recommended that patient get Ativan only as needed.  She has not received any Ativan since changing the order to "As needed"  yesterday afternoon.     Past Psychiatric History: Denies ever receiving any type of mental health services  Risk to Self:   Risk to Others:   Prior Inpatient Therapy:   Prior Outpatient Therapy:    Past Medical History:  Past Medical History:  Diagnosis Date   Anxiety    Cancer ([place])    pt states hx of uterine cancer and had a complete hyst   CHF (congestive heart failure) ([place])    Depression    Diabetes mellitus without complication ([place])    Hyperlipidemia  Hypertension     Past Surgical History:  Procedure Laterality Date   ABDOMINAL HYSTERECTOMY     AMPUTATION Left 12/19/2019   Procedure: AMPUTATION RAY Left 5th;  Surgeon: Caroline More, DPM;  Location: [facility] ORS;  Service: Podiatry;  Laterality: Left;   CHOLECYSTECTOMY     LOWER EXTREMITY ANGIOGRAPHY Left 12/17/2019   Procedure: Lower Extremity Angiography;  Surgeon: Algernon Huxley, MD;  Location: [facility];  Service: Cardiovascular;  Laterality: Left;   Family History:  Family History  Problem Relation Age of Onset   Hypertension Mother    Heart disease  Father    Prostate cancer Brother    Diabetes Maternal Aunt    Kidney cancer Neg Hx    Bladder Cancer Neg Hx    Family Psychiatric  History: Reports brother has schizophrenia Social History:  Social History   Substance and Sexual Activity  Alcohol Use No     Social History   Substance and Sexual Activity  Drug Use No    Social History   Socioeconomic History   Marital status: Single    Spouse name: Not on file   Number of children: Not on file   Years of education: Not on file   Highest education level: Not on file  Occupational History   Not on file  Tobacco Use   Smoking status: Never   Smokeless tobacco: Never  Substance and Sexual Activity   Alcohol use: No   Drug use: No   Sexual activity: Not Currently  Other Topics Concern   Not on file  Social History Narrative   Not on file   Social Determinants of Health   Financial Resource Strain: Not on file  Food Insecurity: Not on file  Transportation Needs: Not on file  Physical Activity: Not on file  Stress: Not on file  Social Connections: Not on file   Additional Social History:    Allergies:   Allergies  Allergen Reactions   Codeine     Labs:  Results for orders placed or performed during the hospital encounter of 09/23/21 (from the past 48 hour(s))  Glucose, capillary     Status: Abnormal   Collection Time: 10/05/21 11:36 AM  Result Value Ref Range   Glucose-Capillary 199 (H) 70 - 99 mg/dL    Comment: Glucose reference range applies only to samples taken after fasting for at least 8 hours.  Glucose, capillary     Status: Abnormal   Collection Time: 10/05/21  9:47 PM  Result Value Ref Range   Glucose-Capillary 247 (H) 70 - 99 mg/dL    Comment: Glucose reference range applies only to samples taken after fasting for at least 8 hours.  Glucose, capillary     Status: Abnormal   Collection Time: 10/06/21  8:02 AM  Result Value Ref Range   Glucose-Capillary 138 (H) 70 - 99 mg/dL    Comment:  Glucose reference range applies only to samples taken after fasting for at least 8 hours.  Glucose, capillary     Status: Abnormal   Collection Time: 10/06/21 11:11 AM  Result Value Ref Range   Glucose-Capillary 164 (H) 70 - 99 mg/dL    Comment: Glucose reference range applies only to samples taken after fasting for at least 8 hours.  Glucose, capillary     Status: Abnormal   Collection Time: 10/06/21  3:55 PM  Result Value Ref Range   Glucose-Capillary 243 (H) 70 - 99 mg/dL    Comment: Glucose reference range  applies only to samples taken after fasting for at least 8 hours.  Glucose, capillary     Status: Abnormal   Collection Time: 10/06/21  8:20 PM  Result Value Ref Range   Glucose-Capillary 250 (H) 70 - 99 mg/dL    Comment: Glucose reference range applies only to samples taken after fasting for at least 8 hours.  Glucose, capillary     Status: Abnormal   Collection Time: 10/07/21  7:12 AM  Result Value Ref Range   Glucose-Capillary 173 (H) 70 - 99 mg/dL    Comment: Glucose reference range applies only to samples taken after fasting for at least 8 hours.   Comment 1 Notify RN    Comment 2 Document in Chart     Current Facility-Administered Medications  Medication Dose Route Frequency Provider Last Rate Last Admin   acetaminophen (TYLENOL) tablet 650 mg  650 mg Oral Q6H PRN Rise Patience, MD   650 mg at 10/04/21 2157   Or   acetaminophen (TYLENOL) suppository 650 mg  650 mg Rectal Q6H PRN Rise Patience, MD       apixaban Arne Cleveland) tablet 5 mg  5 mg Oral BID Benita Gutter, RPH   5 mg at 10/06/21 2150   ARIPiprazole (ABILIFY) tablet 5 mg  5 mg Oral Daily Sharen Hones, MD   5 mg at 10/06/21 0867   busPIRone (BUSPAR) tablet 10 mg  10 mg Oral TID Sharen Hones, MD   10 mg at 10/06/21 2148   donepezil (ARICEPT) tablet 10 mg  10 mg Oral QHS Sharen Hones, MD   10 mg at 10/06/21 2146   gabapentin (NEURONTIN) capsule 300 mg  300 mg Oral QHS Sharen Hones, MD   300 mg at  10/06/21 2150   hydrALAZINE (APRESOLINE) injection 5 mg  5 mg Intravenous Q4H PRN Rise Patience, MD       insulin aspart (novoLOG) injection 0-5 Units  0-5 Units Subcutaneous QHS Sharen Hones, MD   2 Units at 10/06/21 2154   insulin aspart (novoLOG) injection 0-9 Units  0-9 Units Subcutaneous TID WC Sharen Hones, MD   3 Units at 10/06/21 1706   insulin glargine-yfgn (SEMGLEE) injection 15 Units  15 Units Subcutaneous QHS Rise Patience, MD   15 Units at 10/06/21 2153   ketorolac (TORADOL) 15 MG/ML injection 15 mg  15 mg Intravenous Once Foust, Katy L, NP       loperamide (IMODIUM) capsule 2 mg  2 mg Oral PRN Sharen Hones, MD   2 mg at 10/02/21 1714   loperamide (IMODIUM) capsule 2 mg  2 mg Oral PRN Mansy, Jan A, MD       LORazepam (ATIVAN) tablet 0.5 mg  0.5 mg Oral Q8H PRN Sharen Hones, MD       QUEtiapine (SEROQUEL) tablet 75 mg  75 mg Oral QHS Enzo Bi, MD   75 mg at 10/06/21 2148    Musculoskeletal: Strength & Muscle Tone:  Some tremor Gait & Station:  Did not observe Patient leans: N/A  Psychiatric Specialty Exam:  Presentation  General Appearance: Appropriate for Environment Eye Contact:Good Speech:Clear and Coherent Speech Volume:Normal Handedness:No data recorded  Mood and Affect  Mood:Euthymic Affect:Appropriate; Congruent  Thought Process  Thought Processes:Coherent Descriptions of Associations:Intact  Orientation:Full (Time, Place and Person)  Thought Content:WDL  History of Schizophrenia/Schizoaffective disorder:No data recorded Duration of Psychotic Symptoms:No data recorded Hallucinations:Hallucinations: None  Ideas of Reference:None  Suicidal Thoughts:Suicidal Thoughts: No  Homicidal Thoughts:Homicidal Thoughts: No  Sensorium  Memory:Recent Good; Remote Good; Immediate Fair Judgment:Good Insight:Good  Executive Functions  Concentration:Good Attention Span:Good [place] of Knowledge:Good Language:Good  Psychomotor  Activity  Psychomotor Activity:Psychomotor Activity: Normal  Assets  Assets:Communication Skills; Financial Resources/Insurance; Social Support; Resilience  Sleep  Sleep:Sleep: Good  Physical Exam: Physical Exam Vitals and nursing note reviewed.  Constitutional:      Appearance: She is obese.  HENT:     Head: Normocephalic.     Nose: No congestion or rhinorrhea.  Eyes:     General:        Right eye: No discharge.        Left eye: No discharge.  Cardiovascular:     Rate and Rhythm: Normal rate.  Pulmonary:     Effort: Pulmonary effort is normal.  Musculoskeletal:     Cervical back: Normal range of motion.     Comments: Currently nonambulatory  Skin:    General: Skin is dry.  Neurological:     Mental Status: She is alert and oriented to person, place, and time.  Psychiatric:        Attention and Perception: Attention normal.        Mood and Affect: Mood normal.        Speech: Speech normal.        Behavior: Behavior normal.        Thought Content: Thought content normal.        Cognition and Memory: Memory is impaired.        Judgment: Judgment normal.   Review of Systems  HENT: Negative.    Respiratory: Negative.    Musculoskeletal: Negative.   Neurological:  Positive for tremors.  Psychiatric/Behavioral:  Negative for depression, hallucinations, memory loss, substance abuse and suicidal ideas. The patient is not nervous/anxious and does not have insomnia.   Blood pressure 138/66, pulse 70, temperature 98 F (36.7 C), temperature source Oral, resp. rate 20, height 5\' 3"  (1.6 m), weight 98.1 kg, SpO2 96 %. Body mass index is 38.31 kg/m.  Treatment Plan Summary: Plan :  Scheduled Ativan has been discontinued and changed to as needed.  Writer asked the chaplain to visit patient, as she appears to enjoy company and talking. Reviewed with Attending, Dr. Roosevelt Locks  Disposition: No evidence of imminent risk to self or others at present.   Patient does not meet criteria for  psychiatric inpatient admission. Supportive therapy provided about ongoing stressors.  Sherlon Handing, NP 10/07/2021 9:55 AM

## 2021-10-07 NOTE — TOC Progression Note (Signed)
Transition of Care [facility]) - Progression Note    Patient Details  Name: Judith Shaw MRN: [id number] Date of Birth: 12/08/1950  [facility]) CM/SW Contact  Anselm Pancoast, RN Phone Number: 10/07/2021, 4:37 PM  Clinical Narrative:    Received call from sister, [name] reporting patient had called her crying and reporting that she wanted to get up in chair and could not. RN CM outreached to nursing staff and confirmed patient was in chair mode in the bed currently. [name] requesting patient be moved to a real chair today. RN CM advised would reach out to nursing staff and inform of request for movement into a chair in the room.    Expected Discharge Plan: [place] Barriers to Discharge: Continued Medical Work up  Expected Discharge Plan and Services Expected Discharge Plan: [place] Choice: [place] (LT[facility]) Living arrangements for the past 2 months: [place]                                       Social Determinants of Health (SDOH) Interventions    Readmission Risk Interventions Readmission Risk Prevention Plan 08/12/2021  Transportation Screening Complete  PCP or Specialist Appt within 3-5 Days Complete  HRI or [place] Complete  Social Work Consult for [place] Planning/Counseling Complete  Palliative Care Screening Not Applicable  Medication Review Press photographer) Complete  Some recent data might be hidden

## 2021-10-07 NOTE — TOC Progression Note (Signed)
Transition of Care [facility]) - Progression Note    Patient Details  Name: Judith Shaw MRN: [id number] Date of Birth: [birth year]  Transition of Care [facility]) CM/SW Contact  Anselm Pancoast, RN Phone Number: 10/07/2021, 9:32 AM  Clinical Narrative:    MD decreased Ativan due to excessive drowsiness and concerns with reviewing facilities. Patient more awake and alert today with no behavior issues. RN CM updated [facility] in hopes facility will be willing to review patient again for possible placement at [facility]. Facility in [place], [place] still reviewing as well.    Expected Discharge Plan: [place] Barriers to Discharge: Continued Medical Work up  Expected Discharge Plan and Services Expected Discharge Plan: [place] Choice: [place] (LTAC) Living arrangements for the past 2 months: [place]                                       Social Determinants of Health (SDOH) Interventions    Readmission Risk Interventions Readmission Risk Prevention Plan 08/12/2021  Transportation Screening Complete  PCP or Specialist Appt within 3-5 Days Complete  HRI or [place] Complete  Social Work Consult for [place] Planning/Counseling Complete  Palliative Care Screening Not Applicable  Medication Review Press photographer) Complete  Some recent data might be hidden

## 2021-10-07 NOTE — Progress Notes (Signed)
PROGRESS NOTE    Judith Shaw  HGD:[id number] DOB: May 06, 1951 DOA: 09/23/2021 PCP: Pcp, No    Brief Narrative:  Judith Shaw is a 71 y.o. female with history of diabetes mellitus type 2, hyperlipidemia, depression, sleep apnea noncompliant with CPAP prior history of hysterectomy for uterine CA was brought to the ER from skilled nursing facility after patient had a brief episode of loss of consciousness. MRI brain did not show any acute changes. She has elevated D-dimer, CT angiogram chest showed bilateral PE.  She is started on IV heparin.  Switched to Eliquis on 1/29.   Assessment & Plan:   Principal Problem:   Acute encephalopathy Active Problems:   UTI due to Klebsiella species   Diabetes (HCC)   Chronic diastolic CHF (congestive heart failure) (HCC)   Benign essential HTN   Dementia without behavioral disturbance (HCC)   Syncope   Lethargy   Pressure injury of skin   Bilateral pulmonary embolism (HCC)   Bilateral pulm emboli. Acute metabolic encephalopathy, resolved Syncope and hypotension due to PE.  Continue Eliquis. continue Seroquel 50 mg nightly Patient was very sleepy yesterday, I have changed her Ativan to as needed, her mental status has improved this morning. She is also evaluated by speech therapy, no evidence of aspiration. She has some hand tremor which is chronic in nature, it has been happening for 6 months.  No worsening at this time.     Generalized weakness. Frequent falls Discussed with Education officer, museum, still pending for nursing home placement.     Klebsiella pneumonia UTI. --s/p a dose of fosfomycin, treated.   Morbid obesity. Patient with a BMI of 38, with associated chronic diastolic congestive heart failure, hypertension and type 2 diabetes.   Type 2 diabetes. --cont glargine 15u nightly --SSI   Essential hypertension --not on BP med PTA --monitor   DVT prophylaxis: Eliquis Code Status: full Family Communication: Sister  updated. Disposition Plan: Pending nursing home placement   Status is: Inpatient Remains inpatient appropriate because: Unsafe discharge.            I/O last 3 completed shifts: In: 0  Out: 1750 [Urine:1750] No intake/output data recorded.     Consultants:  Psych  Procedures: None  Antimicrobials: None  Subjective: Patient was sleepy yesterday, changed Ativan to as needed, she is no longer sleepy today. She has some hand tremor, which is a chronic in nature. Denies any short of breath or cough.  Objective: Vitals:   10/07/21 0017 10/07/21 0408 10/07/21 0709 10/07/21 1118  BP: 130/63 (!) 151/132 138/66 124/60  Pulse: (!) 58 (!) 57 70 72  Resp: 18 18 20 20   Temp: 98.1 F (36.7 C) 98.2 F (36.8 C) 98 F (36.7 C) 97.9 F (36.6 C)  TempSrc: Oral Oral Oral Oral  SpO2: 97% 93% 96% 92%  Weight:      Height:        Intake/Output Summary (Last 24 hours) at 10/07/2021 1223 Last data filed at 10/07/2021 0500 Gross per 24 hour  Intake --  Output 1050 ml  Net -1050 ml   Filed Weights   09/24/21 0532 09/27/21 1102  Weight: 98.1 kg 98.1 kg    Examination:  General exam: Appears calm and comfortable  Respiratory system: Clear to auscultation. Respiratory effort normal. Cardiovascular system: S1 & S2 heard, RRR. No JVD, murmurs, rubs, gallops or clicks. No pedal edema. Gastrointestinal system: Abdomen is nondistended, soft and nontender. No organomegaly or masses felt. Normal bowel sounds heard. Central  nervous system: Alert and oriented x3. No focal neurological deficits. Extremities: Symmetric 5 x 5 power, hand tremor. Skin: No rashes, lesions or ulcers Psychiatry: Judgement and insight appear normal. Mood & affect appropriate.     Data Reviewed: I have personally reviewed following labs and imaging studies  CBC: Recent Labs  Lab 10/04/21 1129  WBC 6.7  HGB 15.0  HCT 46.2*  MCV 89.9  PLT 453   Basic Metabolic Panel: Recent Labs  Lab  10/04/21 1129  NA 139  K 4.4  CL 108  CO2 25  GLUCOSE 156*  BUN 34*  CREATININE 0.92  CALCIUM 9.3  MG 1.7   GFR: Estimated Creatinine Clearance: 62.6 mL/min (by C-G formula based on SCr of 0.92 mg/dL). Liver Function Tests: No results for input(s): AST, ALT, ALKPHOS, BILITOT, PROT, ALBUMIN in the last 168 hours. No results for input(s): LIPASE, AMYLASE in the last 168 hours. No results for input(s): AMMONIA in the last 168 hours. Coagulation Profile: No results for input(s): INR, PROTIME in the last 168 hours. Cardiac Enzymes: No results for input(s): CKTOTAL, CKMB, CKMBINDEX, TROPONINI in the last 168 hours. BNP (last 3 results) No results for input(s): PROBNP in the last 8760 hours. HbA1C: No results for input(s): HGBA1C in the last 72 hours. CBG: Recent Labs  Lab 10/06/21 1111 10/06/21 1555 10/06/21 2020 10/07/21 0712 10/07/21 1117  GLUCAP 164* 243* 250* 173* 222*   Lipid Profile: No results for input(s): CHOL, HDL, LDLCALC, TRIG, CHOLHDL, LDLDIRECT in the last 72 hours. Thyroid Function Tests: No results for input(s): TSH, T4TOTAL, FREET4, T3FREE, THYROIDAB in the last 72 hours. Anemia Panel: No results for input(s): VITAMINB12, FOLATE, FERRITIN, TIBC, IRON, RETICCTPCT in the last 72 hours. Sepsis Labs: No results for input(s): PROCALCITON, LATICACIDVEN in the last 168 hours.  No results found for this or any previous visit (from the past 240 hour(s)).       Radiology Studies: No results found.      Scheduled Meds:  apixaban  5 mg Oral BID   ARIPiprazole  5 mg Oral Daily   busPIRone  10 mg Oral TID   donepezil  10 mg Oral QHS   gabapentin  300 mg Oral QHS   insulin aspart  0-5 Units Subcutaneous QHS   insulin aspart  0-9 Units Subcutaneous TID WC   insulin glargine-yfgn  15 Units Subcutaneous QHS   ketorolac  15 mg Intravenous Once   QUEtiapine  75 mg Oral QHS   Continuous Infusions:   LOS: 13 days    Time spent: 27 minutes    Sharen Hones, MD [facility]   To contact the attending provider between 7A-7P or the covering provider during after hours 7P-7A, please log into the web site [email or web address] and access using universal [place] password for that web site. If you do not have the password, please call the hospital operator.  10/07/2021, 12:23 PM

## 2021-10-08 DIAGNOSIS — E440 Moderate protein-calorie malnutrition: Secondary | ICD-10-CM | POA: Insufficient documentation

## 2021-10-08 DIAGNOSIS — G9340 Encephalopathy, unspecified: Secondary | ICD-10-CM | POA: Diagnosis not present

## 2021-10-08 DIAGNOSIS — I5032 Chronic diastolic (congestive) heart failure: Secondary | ICD-10-CM | POA: Diagnosis not present

## 2021-10-08 DIAGNOSIS — I2699 Other pulmonary embolism without acute cor pulmonale: Secondary | ICD-10-CM | POA: Diagnosis not present

## 2021-10-08 LAB — POTASSIUM: Potassium: 4.7 mmol/L (ref 3.5–5.1)

## 2021-10-08 LAB — GLUCOSE, CAPILLARY
Glucose-Capillary: 169 mg/dL — ABNORMAL HIGH (ref 70–99)
Glucose-Capillary: 172 mg/dL — ABNORMAL HIGH (ref 70–99)
Glucose-Capillary: 235 mg/dL — ABNORMAL HIGH (ref 70–99)
Glucose-Capillary: 184 mg/dL — ABNORMAL HIGH (ref 70–99)

## 2021-10-08 NOTE — Progress Notes (Signed)
PROGRESS NOTE    Judith Shaw  FYB:[id number] DOB: [birth year] DOA: 09/23/2021 PCP: Pcp, No    Brief Narrative:  Judith Shaw is a 71 y.o. female with history of diabetes mellitus type 2, hyperlipidemia, depression, sleep apnea noncompliant with CPAP prior history of hysterectomy for uterine CA was brought to the ER from skilled nursing facility after patient had a brief episode of loss of consciousness. MRI brain did not show any acute changes. She has elevated D-dimer, CT angiogram chest showed bilateral PE.  She is started on IV heparin.  Switched to Eliquis on 1/29.   Assessment & Plan:   Principal Problem:   Acute encephalopathy Active Problems:   UTI due to Klebsiella species   Diabetes (HCC)   Chronic diastolic CHF (congestive heart failure) (HCC)   Benign essential HTN   Dementia without behavioral disturbance (HCC)   Syncope   Lethargy   Pressure injury of skin   Bilateral pulmonary embolism (HCC)   Malnutrition of moderate degree   Bilateral pulm emboli. Acute metabolic encephalopathy, resolved Syncope and hypotension due to PE.  Continue Eliquis. continue Seroquel 50 mg nightly Patient is still doing well today.  Continue Eliquis.  Mental status has improved.     Generalized weakness. Frequent falls Discussed with Education officer, museum, still pending nursing home placement.     Klebsiella pneumonia UTI. --s/p a dose of fosfomycin, treated.   Morbid obesity. Patient with a BMI of 38, with associated chronic diastolic congestive heart failure, hypertension and type 2 diabetes.   Type 2 diabetes. --cont glargine 15u nightly --SSI   Essential hypertension --not on BP med PTA --monitor     DVT prophylaxis: Eliquis Code Status: full Family Communication: Sister updated. Disposition Plan: Pending nursing home placement     Status is: Inpatient Remains inpatient appropriate because: Unsafe discharge.         I/O last 3 completed shifts: In: 23  [P.O.:237] Out: 1200 [Urine:1200] Total I/O In: 240 [P.O.:240] Out: -      Subjective: Patient slept last night, currently doing well. Request to sit up in the chair, I have spoke with nursing assistant. Denies any shortness of breath or cough. No abdominal pain or nausea vomiting.  Objective: Vitals:   10/07/21 1118 10/07/21 2122 10/08/21 0514 10/08/21 0827  BP: 124/60 (!) 156/65 126/88 (!) 108/53  Pulse: 72 80 70 63  Resp: 20 18 16 16   Temp: 97.9 F (36.6 C) 97.6 F (36.4 C) 97.6 F (36.4 C) 98.5 F (36.9 C)  TempSrc: Oral Oral Oral Oral  SpO2: 92% 92% 91% 96%  Weight:      Height:        Intake/Output Summary (Last 24 hours) at 10/08/2021 1207 Last data filed at 10/08/2021 0900 Gross per 24 hour  Intake 477 ml  Output 700 ml  Net -223 ml   Filed Weights   09/24/21 0532 09/27/21 1102  Weight: 98.1 kg 98.1 kg    Examination:  General exam: Appears calm and comfortable  Respiratory system: Clear to auscultation. Respiratory effort normal. Cardiovascular system: S1 & S2 heard, RRR. No JVD, murmurs, rubs, gallops or clicks. No pedal edema. Gastrointestinal system: Abdomen is nondistended, soft and nontender. No organomegaly or masses felt. Normal bowel sounds heard. Central nervous system: Alert and oriented. No focal neurological deficits. Extremities: Symmetric 5 x 5 power. Skin: No rashes, lesions or ulcers Psychiatry: Judgement and insight appear normal. Mood & affect appropriate.     Data Reviewed: I have personally  reviewed following labs and imaging studies  CBC: Recent Labs  Lab 10/04/21 1129 10/07/21 2024  WBC 6.7 6.8  NEUTROABS  --  4.1  HGB 15.0 15.7*  HCT 46.2* 48.1*  MCV 89.9 89.9  PLT 253 626   Basic Metabolic Panel: Recent Labs  Lab 10/04/21 1129 10/07/21 2024 10/08/21 0102  NA 139 137  --   K 4.4 6.1* 4.7  CL 108 102  --   CO2 25 26  --   GLUCOSE 156* 244*  --   BUN 34* 36*  --   CREATININE 0.92 1.06*  --   CALCIUM 9.3 9.6   --   MG 1.7 1.9  --   PHOS  --  3.4  --    GFR: Estimated Creatinine Clearance: 54.3 mL/min (A) (by C-G formula based on SCr of 1.06 mg/dL (H)). Liver Function Tests: No results for input(s): AST, ALT, ALKPHOS, BILITOT, PROT, ALBUMIN in the last 168 hours. No results for input(s): LIPASE, AMYLASE in the last 168 hours. No results for input(s): AMMONIA in the last 168 hours. Coagulation Profile: No results for input(s): INR, PROTIME in the last 168 hours. Cardiac Enzymes: No results for input(s): CKTOTAL, CKMB, CKMBINDEX, TROPONINI in the last 168 hours. BNP (last 3 results) No results for input(s): PROBNP in the last 8760 hours. HbA1C: No results for input(s): HGBA1C in the last 72 hours. CBG: Recent Labs  Lab 10/07/21 0712 10/07/21 1117 10/07/21 1552 10/07/21 2124 10/08/21 0829  GLUCAP 173* 222* 191* 265* 169*   Lipid Profile: No results for input(s): CHOL, HDL, LDLCALC, TRIG, CHOLHDL, LDLDIRECT in the last 72 hours. Thyroid Function Tests: No results for input(s): TSH, T4TOTAL, FREET4, T3FREE, THYROIDAB in the last 72 hours. Anemia Panel: No results for input(s): VITAMINB12, FOLATE, FERRITIN, TIBC, IRON, RETICCTPCT in the last 72 hours. Sepsis Labs: No results for input(s): PROCALCITON, LATICACIDVEN in the last 168 hours.  No results found for this or any previous visit (from the past 240 hour(s)).       Radiology Studies: No results found.      Scheduled Meds:  apixaban  5 mg Oral BID   ARIPiprazole  5 mg Oral Daily   vitamin C  500 mg Oral BID   busPIRone  10 mg Oral TID   donepezil  10 mg Oral QHS   feeding supplement (GLUCERNA SHAKE)  237 mL Oral BID BM   gabapentin  300 mg Oral QHS   insulin aspart  0-5 Units Subcutaneous QHS   insulin aspart  0-9 Units Subcutaneous TID WC   insulin glargine-yfgn  15 Units Subcutaneous QHS   ketorolac  15 mg Intravenous Once   multivitamin with minerals  1 tablet Oral Daily   Ensure Max Protein  11 oz Oral QHS    QUEtiapine  75 mg Oral QHS   zinc sulfate  220 mg Oral Daily   Continuous Infusions:   LOS: 14 days    Time spent: 22 minutes    Sharen Hones, MD [facility]   To contact the attending provider between 7A-7P or the covering provider during after hours 7P-7A, please log into the web site [email or web address] and access using universal [place] password for that web site. If you do not have the password, please call the hospital operator.  10/08/2021, 12:07 PM

## 2021-10-09 DIAGNOSIS — G9340 Encephalopathy, unspecified: Secondary | ICD-10-CM | POA: Diagnosis not present

## 2021-10-09 DIAGNOSIS — I5032 Chronic diastolic (congestive) heart failure: Secondary | ICD-10-CM | POA: Diagnosis not present

## 2021-10-09 DIAGNOSIS — I2699 Other pulmonary embolism without acute cor pulmonale: Secondary | ICD-10-CM | POA: Diagnosis not present

## 2021-10-09 LAB — GLUCOSE, CAPILLARY
Glucose-Capillary: 216 mg/dL — ABNORMAL HIGH (ref 70–99)
Glucose-Capillary: 259 mg/dL — ABNORMAL HIGH (ref 70–99)
Glucose-Capillary: 183 mg/dL — ABNORMAL HIGH (ref 70–99)

## 2021-10-09 NOTE — Progress Notes (Signed)
PROGRESS NOTE    Judith Shaw  DXA:[id number] DOB: [birth year] DOA: 09/23/2021 PCP: Pcp, No    Brief Narrative:  Judith Shaw is a 71 y.o. female with history of diabetes mellitus type 2, hyperlipidemia, depression, sleep apnea noncompliant with CPAP prior history of hysterectomy for uterine CA was brought to the ER from skilled nursing facility after patient had a brief episode of loss of consciousness. MRI brain did not show any acute changes. She has elevated D-dimer, CT angiogram chest showed bilateral PE.  She is started on IV heparin.  Switched to Eliquis on 1/29.   Assessment & Plan:   Principal Problem:   Acute encephalopathy Active Problems:   UTI due to Klebsiella species   Diabetes (HCC)   Chronic diastolic CHF (congestive heart failure) (HCC)   Benign essential HTN   Dementia without behavioral disturbance (HCC)   Syncope   Lethargy   Pressure injury of skin   Bilateral pulmonary embolism (HCC)   Malnutrition of moderate degree  Bilateral pulm emboli. Acute metabolic encephalopathy, resolved Syncope and hypotension due to PE.  Continue Eliquis. continue Seroquel 50 mg nightly Patient has no confusion today, continue Eliquis.     Generalized weakness. Frequent falls Patient is difficult to place.  We will continue work with Education officer, museum.     Klebsiella pneumonia UTI. --s/p a dose of fosfomycin, treated.   Morbid obesity. Patient with a BMI of 38, with associated chronic diastolic congestive heart failure, hypertension and type 2 diabetes.   Type 2 diabetes. --cont glargine 15u nightly --SSI   Essential hypertension --not on BP med PTA --monitor     DVT prophylaxis: Eliquis Code Status: full Family Communication: Sister updated. Disposition Plan: Pending nursing home placement     Status is: Inpatient Remains inpatient appropriate because: Unsafe discharge      I/O last 3 completed shifts: In: 720 [P.O.:720] Out: 1200 [Urine:1200] No  intake/output data recorded.     Subjective: Patient slept well last night, no change in her status.  Objective: Vitals:   10/08/21 0827 10/08/21 2124 10/09/21 0324 10/09/21 0804  BP: (!) 108/53 122/82 92/80 (!) 98/59  Pulse: 63 (!) 59 74 76  Resp: 16 16 16 18   Temp: 98.5 F (36.9 C) 98.1 F (36.7 C) 97.7 F (36.5 C) (!) 97.5 F (36.4 C)  TempSrc: Oral   Oral  SpO2: 96% 96% 93% 93%  Weight:      Height:        Intake/Output Summary (Last 24 hours) at 10/09/2021 1045 Last data filed at 10/09/2021 0327 Gross per 24 hour  Intake 480 ml  Output 500 ml  Net -20 ml   Filed Weights   09/24/21 0532 09/27/21 1102  Weight: 98.1 kg 98.1 kg    Examination:  General exam: Appears calm and comfortable, morbid obesity. Respiratory system: Clear to auscultation. Respiratory effort normal. Cardiovascular system: S1 & S2 heard, RRR. No JVD, murmurs, rubs, gallops or clicks. No pedal edema. Gastrointestinal system: Abdomen is nondistended, soft and nontender. No organomegaly or masses felt. Normal bowel sounds heard. Central nervous system: Alert and oriented. No focal neurological deficits. Extremities: Symmetric 5 x 5 power. Skin: No rashes, lesions or ulcers Psychiatry: Judgement and insight appear normal. Mood & affect appropriate.     Data Reviewed: I have personally reviewed following labs and imaging studies  CBC: Recent Labs  Lab 10/04/21 1129 10/07/21 2024  WBC 6.7 6.8  NEUTROABS  --  4.1  HGB 15.0 15.7*  HCT 46.2* 48.1*  MCV 89.9 89.9  PLT 253 222   Basic Metabolic Panel: Recent Labs  Lab 10/04/21 1129 10/07/21 2024 10/08/21 0102  NA 139 137  --   K 4.4 6.1* 4.7  CL 108 102  --   CO2 25 26  --   GLUCOSE 156* 244*  --   BUN 34* 36*  --   CREATININE 0.92 1.06*  --   CALCIUM 9.3 9.6  --   MG 1.7 1.9  --   PHOS  --  3.4  --    GFR: Estimated Creatinine Clearance: 54.3 mL/min (A) (by C-G formula based on SCr of 1.06 mg/dL (H)). Liver Function  Tests: No results for input(s): AST, ALT, ALKPHOS, BILITOT, PROT, ALBUMIN in the last 168 hours. No results for input(s): LIPASE, AMYLASE in the last 168 hours. No results for input(s): AMMONIA in the last 168 hours. Coagulation Profile: No results for input(s): INR, PROTIME in the last 168 hours. Cardiac Enzymes: No results for input(s): CKTOTAL, CKMB, CKMBINDEX, TROPONINI in the last 168 hours. BNP (last 3 results) No results for input(s): PROBNP in the last 8760 hours. HbA1C: No results for input(s): HGBA1C in the last 72 hours. CBG: Recent Labs  Lab 10/07/21 2124 10/08/21 0829 10/08/21 1233 10/08/21 1629 10/08/21 2121  GLUCAP 265* 169* 172* 235* 184*   Lipid Profile: No results for input(s): CHOL, HDL, LDLCALC, TRIG, CHOLHDL, LDLDIRECT in the last 72 hours. Thyroid Function Tests: No results for input(s): TSH, T4TOTAL, FREET4, T3FREE, THYROIDAB in the last 72 hours. Anemia Panel: No results for input(s): VITAMINB12, FOLATE, FERRITIN, TIBC, IRON, RETICCTPCT in the last 72 hours. Sepsis Labs: No results for input(s): PROCALCITON, LATICACIDVEN in the last 168 hours.  No results found for this or any previous visit (from the past 240 hour(s)).       Radiology Studies: No results found.      Scheduled Meds:  apixaban  5 mg Oral BID   ARIPiprazole  5 mg Oral Daily   vitamin C  500 mg Oral BID   busPIRone  10 mg Oral TID   donepezil  10 mg Oral QHS   feeding supplement (GLUCERNA SHAKE)  237 mL Oral BID BM   gabapentin  300 mg Oral QHS   insulin aspart  0-5 Units Subcutaneous QHS   insulin aspart  0-9 Units Subcutaneous TID WC   insulin glargine-yfgn  15 Units Subcutaneous QHS   multivitamin with minerals  1 tablet Oral Daily   Ensure Max Protein  11 oz Oral QHS   QUEtiapine  75 mg Oral QHS   zinc sulfate  220 mg Oral Daily   Continuous Infusions:   LOS: 15 days    Time spent: 27 minutes    Sharen Hones, MD [facility]   To contact the  attending provider between 7A-7P or the covering provider during after hours 7P-7A, please log into the web site [email or web address] and access using universal [place] password for that web site. If you do not have the password, please call the hospital operator.  10/09/2021, 10:45 AM

## 2021-10-10 ENCOUNTER — Inpatient Hospital Stay: Payer: Medicare PPO

## 2021-10-10 DIAGNOSIS — R509 Fever, unspecified: Secondary | ICD-10-CM | POA: Diagnosis not present

## 2021-10-10 DIAGNOSIS — F0390 Unspecified dementia without behavioral disturbance: Secondary | ICD-10-CM | POA: Diagnosis not present

## 2021-10-10 DIAGNOSIS — I2699 Other pulmonary embolism without acute cor pulmonale: Secondary | ICD-10-CM | POA: Diagnosis not present

## 2021-10-10 DIAGNOSIS — G9340 Encephalopathy, unspecified: Secondary | ICD-10-CM | POA: Diagnosis not present

## 2021-10-10 LAB — GLUCOSE, CAPILLARY
Glucose-Capillary: 167 mg/dL — ABNORMAL HIGH (ref 70–99)
Glucose-Capillary: 173 mg/dL — ABNORMAL HIGH (ref 70–99)
Glucose-Capillary: 160 mg/dL — ABNORMAL HIGH (ref 70–99)
Glucose-Capillary: 204 mg/dL — ABNORMAL HIGH (ref 70–99)
Glucose-Capillary: 199 mg/dL — ABNORMAL HIGH (ref 70–99)

## 2021-10-10 LAB — CBC
HCT: 48.2 % — ABNORMAL HIGH (ref 36.0–46.0)
Hemoglobin: 15 g/dL (ref 12.0–15.0)
MCH: 29.2 pg (ref 26.0–34.0)
MCHC: 31.1 g/dL (ref 30.0–36.0)
MCV: 93.8 fL (ref 80.0–100.0)
Platelets: 204 10*3/uL (ref 150–400)
RBC: 5.14 MIL/uL — ABNORMAL HIGH (ref 3.87–5.11)
RDW: 14.4 % (ref 11.5–15.5)
WBC: 5.2 10*3/uL (ref 4.0–10.5)
nRBC: 0 % (ref 0.0–0.2)

## 2021-10-10 LAB — COMPREHENSIVE METABOLIC PANEL
ALT: 19 U/L (ref 0–44)
AST: 29 U/L (ref 15–41)
Albumin: 2.8 g/dL — ABNORMAL LOW (ref 3.5–5.0)
Alkaline Phosphatase: 81 U/L (ref 38–126)
Anion gap: 8 (ref 5–15)
BUN: 33 mg/dL — ABNORMAL HIGH (ref 8–23)
CO2: 21 mmol/L — ABNORMAL LOW (ref 22–32)
Calcium: 8.7 mg/dL — ABNORMAL LOW (ref 8.9–10.3)
Chloride: 102 mmol/L (ref 98–111)
Creatinine, Ser: 0.96 mg/dL (ref 0.44–1.00)
GFR, Estimated: >60 mL/min (ref >60)
Glucose, Bld: 164 mg/dL — ABNORMAL HIGH (ref 70–99)
Potassium: 5.2 mmol/L — ABNORMAL HIGH (ref 3.5–5.1)
Sodium: 131 mmol/L — ABNORMAL LOW (ref 135–145)
Total Bilirubin: 0.5 mg/dL (ref 0.3–1.2)
Total Protein: 6.5 g/dL (ref 6.5–8.1)

## 2021-10-10 LAB — PROCALCITONIN: Procalcitonin: <0.1 ng/mL

## 2021-10-10 LAB — LACTIC ACID, PLASMA
Lactic Acid, Venous: 1.8 mmol/L (ref 0.5–1.9)
Lactic Acid, Venous: 1.5 mmol/L (ref 0.5–1.9)

## 2021-10-10 LAB — RESP PANEL BY RT-PCR (FLU A&B, COVID) ARPGX2
Influenza A by PCR: NEGATIVE
Influenza B by PCR: NEGATIVE
SARS Coronavirus 2 by RT PCR: POSITIVE — AB

## 2021-10-10 IMAGING — DX DG CHEST 1V PORT
1 series · 1 of 1 positions shown · non-contrast
Comparison: Chest x-ray [DATE]

CLINICAL DATA: Fever

EXAM:
PORTABLE CHEST 1 VIEW

[chest ap]
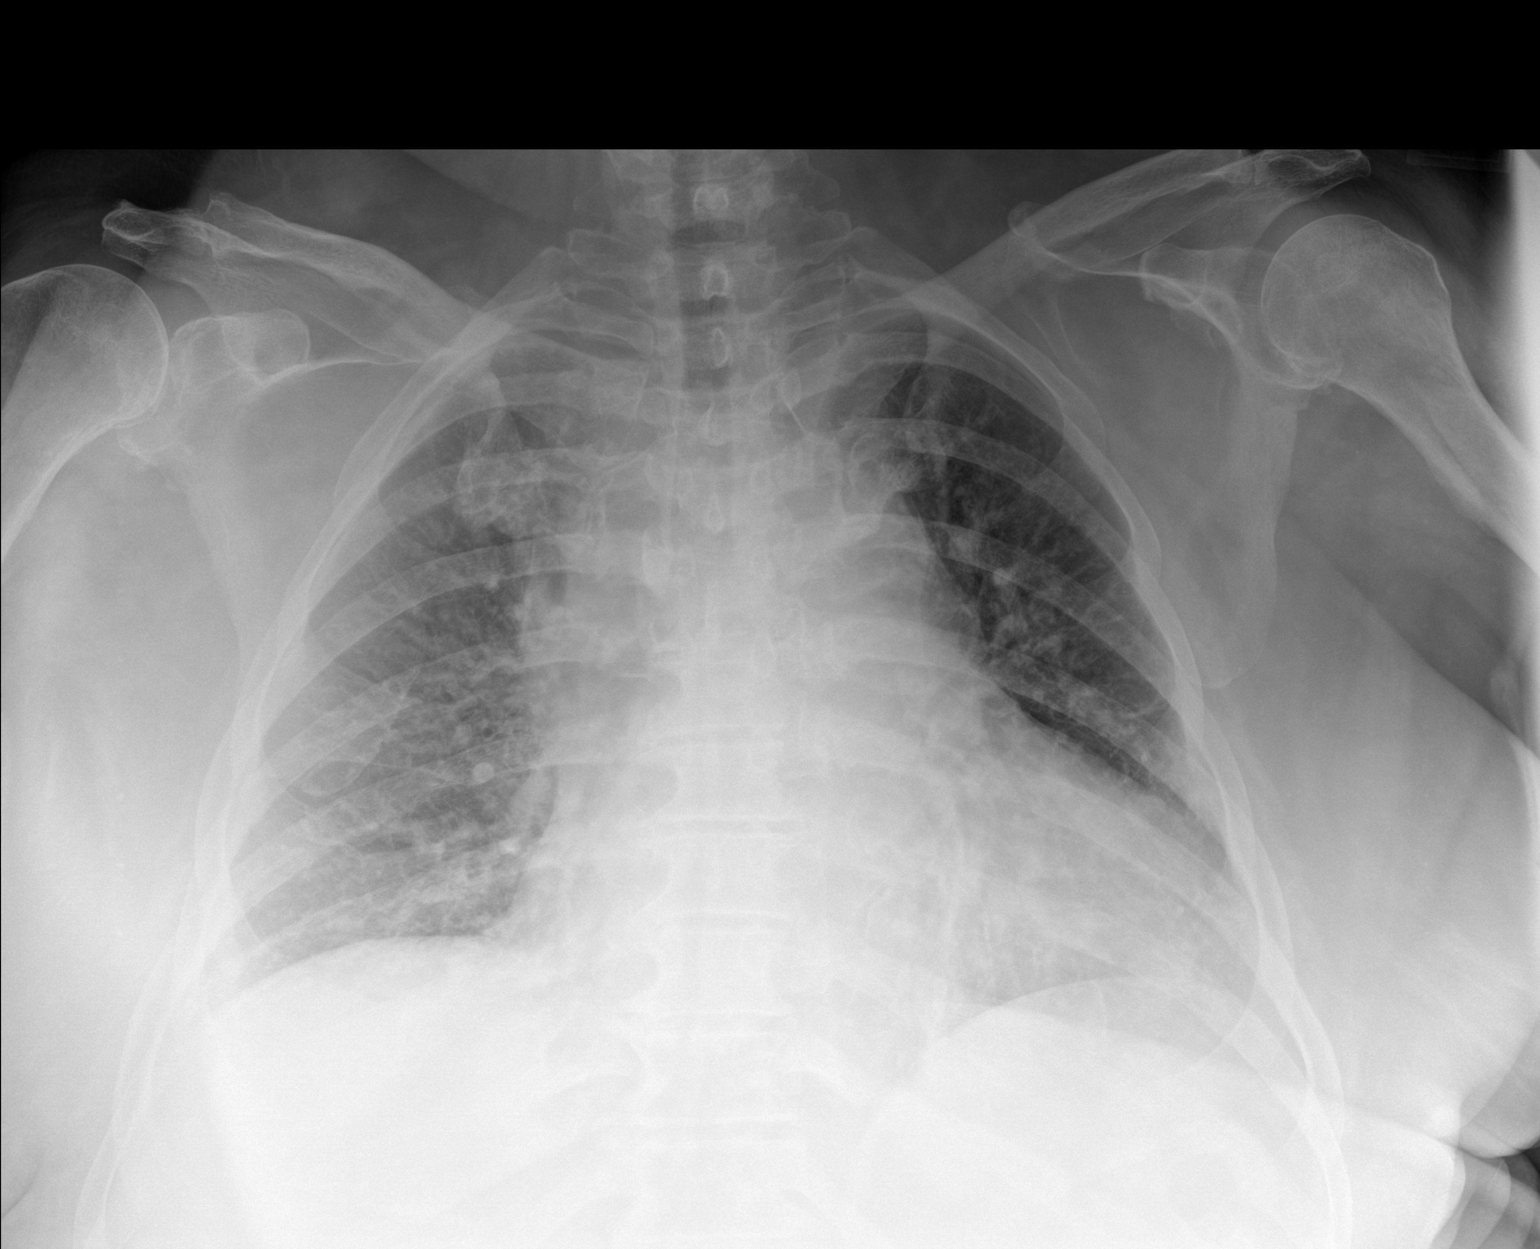

[1 of 1 positions shown; findings below may reference images not displayed]

FINDINGS: Cardiomegaly and mediastinum appear unchanged. Mild pulmonary
vascular prominence. No focal consolidation identified. No pleural
effusion or pneumothorax identified.
IMPRESSION: Cardiomegaly and mild pulmonary vascular prominence.

## 2021-10-10 MED ORDER — PHENOL 1.4 % MT LIQD
1.0000 | OROMUCOSAL | Status: DC | PRN
  Filled 2021-10-10: qty 177

## 2021-10-10 MED ORDER — SODIUM CHLORIDE 0.9 % IV SOLN
200.0000 mg | Freq: Once | INTRAVENOUS | Status: AC
  Administered 2021-10-10: 200 mg via INTRAVENOUS
  Filled 2021-10-10: qty 200

## 2021-10-10 MED ORDER — PANTOPRAZOLE SODIUM 40 MG PO TBEC
40.0000 mg | DELAYED_RELEASE_TABLET | Freq: Every day | ORAL | Status: DC
  Administered 2021-10-10 – 2021-10-21 (×12): 40 mg via ORAL
  Filled 2021-10-10 (×12): qty 1

## 2021-10-10 MED ORDER — SODIUM CHLORIDE 0.9 % IV SOLN
100.0000 mg | Freq: Every day | INTRAVENOUS | Status: DC
  Administered 2021-10-11 – 2021-10-12 (×2): 100 mg via INTRAVENOUS
  Filled 2021-10-10: qty 100
  Filled 2021-10-10: qty 20
  Filled 2021-10-10: qty 100

## 2021-10-10 NOTE — TOC Progression Note (Signed)
Transition of Care [facility]) - Progression Note    Patient Details  Name: KLEE KOLEK MRN: [id number] Date of Birth: 07-Mar-1951  Transition of Care [facility]) CM/SW Contact  Anselm Pancoast, RN Phone Number: 10/10/2021, 1:48 PM  Clinical Narrative:    Received call from Sophronia Simas and sister who had been notified by private sitter that patient was now running fever and having congestion. [name] concerned about possible COVID. RN CM confirmed COVID test remains pending and once results are back member of the treatment team will update her. Family is in Environmental consultant with discharge to [facility] in [place], [place]. Fransisco Hertz is representative from facility. Will anticipate discharge once medically cleared.    Expected Discharge Plan: [place] Barriers to Discharge: Continued Medical Work up  Expected Discharge Plan and Services Expected Discharge Plan: [place] Choice: [place] ([facility]) Living arrangements for the past 2 months: [place]                                       Social Determinants of Health (SDOH) Interventions    Readmission Risk Interventions Readmission Risk Prevention Plan 08/12/2021  Transportation Screening Complete  PCP or Specialist Appt within 3-5 Days Complete  HRI or [place] Complete  Social Work Consult for [place] Planning/Counseling Complete  Palliative Care Screening Not Applicable  Medication Review Press photographer) Complete  Some recent data might be hidden

## 2021-10-10 NOTE — TOC Progression Note (Signed)
Transition of Care [facility]) - Progression Note    Patient Details  Name: CHARLIENE INOUE MRN: [id number] Date of Birth: Aug 22, 1951  Transition of Care [facility]) CM/SW [place], RN Phone Number: 10/10/2021, 1:22 PM  Clinical Narrative:   Patient is undergoing continued medical workup at this time.  TOC to monitor for medical discharge and will continue to pursue placement.    Expected Discharge Plan: [place] Barriers to Discharge: Continued Medical Work up  Expected Discharge Plan and Services Expected Discharge Plan: [place] Choice: [place] ([facility]) Living arrangements for the past 2 months: [place]                                       Social Determinants of Health (SDOH) Interventions    Readmission Risk Interventions Readmission Risk Prevention Plan 08/12/2021  Transportation Screening Complete  PCP or Specialist Appt within 3-5 Days Complete  HRI or [place] Complete  Social Work Consult for [place] Planning/Counseling Complete  Palliative Care Screening Not Applicable  Medication Review Press photographer) Complete  Some recent data might be hidden

## 2021-10-10 NOTE — Progress Notes (Signed)
PROGRESS NOTE    SAHIRA CATALDI  NWG:[id number] DOB: November 30, 1950 DOA: 09/23/2021 PCP: Pcp, No    Brief Narrative:  MARVA HENDRYX is a 71 y.o. female with history of diabetes mellitus type 2, hyperlipidemia, depression, sleep apnea noncompliant with CPAP prior history of hysterectomy for uterine CA was brought to the ER from skilled nursing facility after patient had a brief episode of loss of consciousness. MRI brain did not show any acute changes. She has elevated D-dimer, CT angiogram chest showed bilateral PE.  She is started on IV heparin.  Switched to Eliquis on 1/29.     Assessment & Plan:   Principal Problem:   Acute encephalopathy Active Problems:   UTI due to Klebsiella species   Diabetes (HCC)   Chronic diastolic CHF (congestive heart failure) (HCC)   Benign essential HTN   Dementia without behavioral disturbance (HCC)   Syncope   Lethargy   Pressure injury of skin   Bilateral pulmonary embolism (HCC)   Malnutrition of moderate degree     Bilateral pulm emboli. Acute metabolic encephalopathy, resolved Syncope and hypotension due to PE.  Continue Eliquis. continue Seroquel 50 mg nightly Patient has no confusion today, continue Eliquis.  Low-grade fever. Patient developed a low-grade fever at 100.3 this morning, she also has shaking episode. She had a recent contact with her sister who was diagnosed with COVID.  We will resend COVID swab, also check a UA. Blood culture was also sent out.  Chest x-ray did not show pneumonia, procalcitonin level normal.   Patient has some stomach ache today, gastric area tenderness, she had a history of cholecystectomy.  No concern for cholecystitis.  We will start a Protonix. We will follow for now.    Generalized weakness. Frequent falls Looking for placement.     Klebsiella pneumonia UTI. --s/p a dose of fosfomycin, treated.   Morbid obesity. Patient with a BMI of 38, with associated chronic diastolic congestive heart  failure, hypertension and type 2 diabetes.   Type 2 diabetes. --cont glargine 15u nightly --SSI   Essential hypertension --not on BP med PTA --monitor     DVT prophylaxis: Eliquis Code Status: full Family Communication: Sister updated. Disposition Plan: Pending nursing home placement     Status is: Inpatient Remains inpatient appropriate because: Unsafe discharge      I/O last 3 completed shifts: In: 0  Out: 800 [Urine:800] No intake/output data recorded.     Subjective: Patient had a low-grade fever of 100.3 earlier this morning, she also had a increased tremor. She feels sleepy this morning, denies any cough or shortness of breath. No abdominal pain or nausea vomiting. No diarrhea. She has some stomach ache.  She had a history of a cholecystectomy.  Objective: Vitals:   10/10/21 0105 10/10/21 0600 10/10/21 0634 10/10/21 0940  BP: (!) 111/57 (!) 195/171 122/78 137/82  Pulse: 65 69  72  Resp: 16 18    Temp: 98.8 F (37.1 C) 99.7 F (37.6 C)  100 F (37.8 C)  TempSrc:  Oral  Oral  SpO2: 97% 95%    Weight:      Height:        Intake/Output Summary (Last 24 hours) at 10/10/2021 1229 Last data filed at 10/10/2021 1011 Gross per 24 hour  Intake 0 ml  Output 300 ml  Net -300 ml   Filed Weights   09/24/21 0532 09/27/21 1102  Weight: 98.1 kg 98.1 kg    Examination:  General exam: Appears calm and  comfortable  Respiratory system: Clear to auscultation. Respiratory effort normal. Cardiovascular system: S1 & S2 heard, RRR. No JVD, murmurs, rubs, gallops or clicks. No pedal edema. Gastrointestinal system: Abdomen is nondistended, soft and RUQ tender. No organomegaly or masses felt. Normal bowel sounds heard. Central nervous system: Alert and oriented x2. No focal neurological deficits. Extremities: Symmetric 5 x 5 power. Skin: No rashes, lesions or ulcers Psychiatry: Judgement and insight appear normal. Mood & affect appropriate.     Data Reviewed: I  have personally reviewed following labs and imaging studies  CBC: Recent Labs  Lab 10/04/21 1129 10/07/21 2024 10/10/21 0717  WBC 6.7 6.8 5.2  NEUTROABS  --  4.1  --   HGB 15.0 15.7* 15.0  HCT 46.2* 48.1* 48.2*  MCV 89.9 89.9 93.8  PLT 253 280 161   Basic Metabolic Panel: Recent Labs  Lab 10/04/21 1129 10/07/21 2024 10/08/21 0102 10/10/21 0717  NA 139 137  --  131*  K 4.4 6.1* 4.7 5.2*  CL 108 102  --  102  CO2 25 26  --  21*  GLUCOSE 156* 244*  --  164*  BUN 34* 36*  --  33*  CREATININE 0.92 1.06*  --  0.96  CALCIUM 9.3 9.6  --  8.7*  MG 1.7 1.9  --   --   PHOS  --  3.4  --   --    GFR: Estimated Creatinine Clearance: 60 mL/min (by C-G formula based on SCr of 0.96 mg/dL). Liver Function Tests: Recent Labs  Lab 10/10/21 0717  AST 29  ALT 19  ALKPHOS 81  BILITOT 0.5  PROT 6.5  ALBUMIN 2.8*   No results for input(s): LIPASE, AMYLASE in the last 168 hours. No results for input(s): AMMONIA in the last 168 hours. Coagulation Profile: No results for input(s): INR, PROTIME in the last 168 hours. Cardiac Enzymes: No results for input(s): CKTOTAL, CKMB, CKMBINDEX, TROPONINI in the last 168 hours. BNP (last 3 results) No results for input(s): PROBNP in the last 8760 hours. HbA1C: No results for input(s): HGBA1C in the last 72 hours. CBG: Recent Labs  Lab 10/09/21 1201 10/09/21 1614 10/09/21 1953 10/10/21 0630 10/10/21 0910  GLUCAP 216* 259* 183* 167* 173*   Lipid Profile: No results for input(s): CHOL, HDL, LDLCALC, TRIG, CHOLHDL, LDLDIRECT in the last 72 hours. Thyroid Function Tests: No results for input(s): TSH, T4TOTAL, FREET4, T3FREE, THYROIDAB in the last 72 hours. Anemia Panel: No results for input(s): VITAMINB12, FOLATE, FERRITIN, TIBC, IRON, RETICCTPCT in the last 72 hours. Sepsis Labs: Recent Labs  Lab 10/10/21 0717 10/10/21 0919  PROCALCITON  --  <0.10  LATICACIDVEN 1.8 1.5    No results found for this or any previous visit (from the  past 240 hour(s)).       Radiology Studies: DG Chest Port 1 View  Result Date: 10/10/2021 CLINICAL DATA:  Fever EXAM: PORTABLE CHEST 1 VIEW COMPARISON:  Chest x-ray 09/23/2021 FINDINGS: Cardiomegaly and mediastinum appear unchanged. Mild pulmonary vascular prominence. No focal consolidation identified. No pleural effusion or pneumothorax identified. IMPRESSION: Cardiomegaly and mild pulmonary vascular prominence. Electronically Signed   By: Ofilia Neas M.D.   On: 10/10/2021 08:59        Scheduled Meds:  apixaban  5 mg Oral BID   ARIPiprazole  5 mg Oral Daily   vitamin C  500 mg Oral BID   busPIRone  10 mg Oral TID   donepezil  10 mg Oral QHS   feeding supplement (GLUCERNA  SHAKE)  237 mL Oral BID BM   gabapentin  300 mg Oral QHS   insulin aspart  0-5 Units Subcutaneous QHS   insulin aspart  0-9 Units Subcutaneous TID WC   insulin glargine-yfgn  15 Units Subcutaneous QHS   multivitamin with minerals  1 tablet Oral Daily   Ensure Max Protein  11 oz Oral QHS   QUEtiapine  75 mg Oral QHS   zinc sulfate  220 mg Oral Daily   Continuous Infusions:   LOS: 16 days    Time spent: 32 minutes    Sharen Hones, MD [facility]   To contact the attending provider between 7A-7P or the covering provider during after hours 7P-7A, please log into the web site [email or web address] and access using universal [place] password for that web site. If you do not have the password, please call the hospital operator.  10/10/2021, 12:29 PM

## 2021-10-10 NOTE — Progress Notes (Signed)
Physical Therapy Treatment Patient Details Name: Judith Shaw MRN: [id number] DOB: 01-May-1951 Today's Date: 10/10/2021   History of Present Illness Pt is a 71 y/o F admitted on 09/23/21 who presented to the ED from SNF after pt had a brief episode of LOC. MRI was negative for any acute changes. Pt with elevated d-dimer, CT angiogram chest showed bilateral PE. Pt was started on IV heparin. PMH: DM2, HLD, depression, sleep apnea noncompliant with CPAP, hysterectomy for uterine CA.    PT Comments    Patient received in bed. She is now covid +. Patient agreeable to PT/OT session. She does not assist at all with mobility. Actually basically resists rolling and attempting to sit up on side of bed. Patient agitated with mobility. She has continued to require +2 total assist for all mobility. Patient is not making progress with mobility and does not seem motivated to progress or help. Will sign off at this time. Patient planning to go to long term care upon discharge.       Recommendations for follow up therapy are one component of a multi-disciplinary discharge planning process, led by the attending physician.  Recommendations may be updated based on patient status, additional functional criteria and insurance authorization.  Follow Up Recommendations  Long-term institutional care without follow-up therapy     Assistance Recommended at Discharge Frequent or constant Supervision/Assistance  Patient can return home with the following Two people to help with bathing/dressing/bathroom;Direct supervision/assist for medications management;Assist for transportation;Direct supervision/assist for financial management;Assistance with cooking/housework   Equipment Recommendations  None recommended by PT    Recommendations for Other Services       Precautions / Restrictions Precautions Precautions: Fall Restrictions Weight Bearing Restrictions: No     Mobility  Bed Mobility Overal bed mobility: Needs  Assistance Bed Mobility: Rolling, Supine to Sit, Sit to Supine Rolling: +2 for physical assistance, +2 for safety/equipment, Total assist   Supine to sit: Total assist, +2 for physical assistance, HOB elevated, +2 for safety/equipment Sit to supine: Total assist, +2 for physical assistance, +2 for safety/equipment   General bed mobility comments: patient provided NO assistance despite cues for rolling in bed or supine>< sit. She is very stiff and reporting pain with rolling. Getting agitated during mobility. Saying "GD it"    Transfers                   General transfer comment: pt will require mechanical lift for all transfers.    Ambulation/Gait               General Gait Details: Pt was non ambulatory at baseline   Stairs             Wheelchair Mobility    Modified Rankin (Stroke Patients Only)       Balance Overall balance assessment: Needs assistance Sitting-balance support: No upper extremity supported, Feet unsupported Sitting balance-Leahy Scale: Zero Sitting balance - Comments: patient does not attempt to help herself balance on edge of bed. Did not try to self correct at all. Requesting to lie back down. Postural control: Left lateral lean, Posterior lean                                  Cognition Arousal/Alertness: Awake/alert Behavior During Therapy: Flat affect, Agitated Overall Cognitive Status: Difficult to assess Area of Impairment: Problem solving, Safety/judgement, Awareness  Orientation Level: Disoriented to, Situation Current Attention Level: Sustained   Following Commands: Follows one step commands inconsistently     Problem Solving: Decreased initiation, Requires verbal cues, Requires tactile cues          Exercises      General Comments        Pertinent Vitals/Pain Pain Assessment Pain Assessment: Faces Faces Pain Scale: Hurts even more Breathing: normal Negative  Vocalization: occasional moan/groan, low speech, negative/disapproving quality Facial Expression: facial grimacing Body Language: tense, distressed pacing, fidgeting Consolability: distracted or reassured by voice/touch PAINAD Score: 5 Pain Location: L LE with movement, general pain with rolling in bed Pain Descriptors / Indicators: Discomfort, Grimacing, Guarding Pain Intervention(s): Monitored during session, Repositioned    Home Living                          Prior Function            PT Goals (current goals can now be found in the care plan section) Acute Rehab PT Goals Patient Stated Goal: none stated PT Goal Formulation: With patient Potential to Achieve Goals: Poor Progress towards PT goals: Not progressing toward goals - comment (Patient not attempting to assist with mobility. Continues to require +2 total assist for bed mobility.)    Frequency    Min 2X/week      PT Plan Current plan remains appropriate;Frequency needs to be updated    Co-evaluation PT/OT/SLP Co-Evaluation/Treatment: Yes Reason for Co-Treatment: To address functional/ADL transfers;For patient/therapist safety          AM-PAC PT "6 Clicks" Mobility   Outcome Measure  Help needed turning from your back to your side while in a flat bed without using bedrails?: Total Help needed moving from lying on your back to sitting on the side of a flat bed without using bedrails?: Total Help needed moving to and from a bed to a chair (including a wheelchair)?: Total Help needed standing up from a chair using your arms (e.g., wheelchair or bedside chair)?: Total Help needed to walk in hospital room?: Total Help needed climbing 3-5 steps with a railing? : Total 6 Click Score: 6    End of Session   Activity Tolerance: Patient limited by pain;Treatment limited secondary to agitation Patient left: in bed;with call bell/phone within reach Nurse Communication: Mobility status PT Visit Diagnosis:  Muscle weakness (generalized) (M62.81);Other abnormalities of gait and mobility (R26.89);Pain Pain - Right/Left: Right     Time: 3903-0092 PT Time Calculation (min) (ACUTE ONLY): 23 min  Charges:  $Therapeutic Activity: 8-22 mins                     Judith Shaw, PT, GCS 10/10/21,4:14 PM

## 2021-10-10 NOTE — Evaluation (Signed)
Occupational Therapy Re-Evaluation Patient Details Name: Judith Shaw MRN: [id number] DOB: [birth year] Today's Date: 10/10/2021   History of Present Illness Pt is a 71 y/o F admitted on 09/23/21 who presented to the ED from SNF after pt had a brief episode of LOC. MRI was negative for any acute changes. Pt with elevated d-dimer, CT angiogram chest showed bilateral PE. Pt was started on IV heparin. PMH: DM2, HLD, depression, sleep apnea noncompliant with CPAP, hysterectomy for uterine CA.   Clinical Impression   [name] was seen for OT/PT co-treatment on this date. Upon arrival to room pt reclined in bed, agreeable to tx. Pt requries TOTAL A x2 toileting at bed level - hand over hand placement of R arm on bed rail, does not pull on to assist with rolling. SETUP hand washing at bed level. Pt has made minimal progress towards goals, continues to be TOTAL A for all mobility. 2 week trial of OT complete with no further skilled acute OT needs identified. Discharge recommendation for LTC remains appropriate. Will sign off.       Recommendations for follow up therapy are one component of a multi-disciplinary discharge planning process, led by the attending physician.  Recommendations may be updated based on patient status, additional functional criteria and insurance authorization.   Follow Up Recommendations  Long-term institutional care without follow-up therapy    Assistance Recommended at Discharge Frequent or constant Supervision/Assistance  Patient can return home with the following Two people to help with walking and/or transfers;Two people to help with bathing/dressing/bathroom    Functional Status Assessment     Equipment Recommendations  Hospital bed    Recommendations for Other Services       Precautions / Restrictions Precautions Precautions: Fall Restrictions Weight Bearing Restrictions: No      Mobility Bed Mobility Overal bed mobility: Needs Assistance Bed Mobility: Rolling,  Supine to Sit, Sit to Supine Rolling: +2 for physical assistance, +2 for safety/equipment, Total assist   Supine to sit: Total assist, +2 for physical assistance, HOB elevated, +2 for safety/equipment Sit to supine: Total assist, +2 for physical assistance, +2 for safety/equipment   General bed mobility comments: yells with mobility    Transfers                          Balance Overall balance assessment: Needs assistance Sitting-balance support: No upper extremity supported, Feet unsupported Sitting balance-Leahy Scale: Zero Sitting balance - Comments: patient does not attempt to help herself balance on edge of bed. Did not try to self correct at all. Requesting to lie back down. Postural control: Left lateral lean, Posterior lean                                 ADL either performed or assessed with clinical judgement   ADL Overall ADL's : Needs assistance/impaired                                       General ADL Comments: TOTAL A x2 toileting at bed level - hand over hand placement of R arm on bed rail, does not pull on to assist with rolling. SETUP hand washing at bed level      Pertinent Vitals/Pain Pain Assessment Pain Assessment: Faces Faces Pain Scale: Hurts even more Pain Location: L LE  with movement, general pain with rolling in bed Pain Descriptors / Indicators: Discomfort, Grimacing, Guarding Pain Intervention(s): Limited activity within patient's tolerance, Repositioned     Hand Dominance     Extremity/Trunk Assessment Upper Extremity Assessment Upper Extremity Assessment: Generalized weakness   Lower Extremity Assessment Lower Extremity Assessment: Defer to PT evaluation       Communication     Cognition Arousal/Alertness: Awake/alert Behavior During Therapy: Flat affect, Agitated Overall Cognitive Status: Difficult to assess Area of Impairment: Problem solving, Safety/judgement, Awareness                  Orientation Level: Disoriented to, Situation Current Attention Level: Sustained   Following Commands: Follows one step commands inconsistently     Problem Solving: Decreased initiation, Requires verbal cues, Requires tactile cues         OT Goals(Current goals can be found in the care plan section) Acute Rehab OT Goals Patient Stated Goal: to not fall OT Goal Formulation: With patient Time For Goal Achievement: 10/24/21 Potential to Achieve Goals: Fair ADL Goals Pt Will Perform Grooming: with min guard assist;sitting Pt Will Perform Upper Body Dressing: with min guard assist;sitting Pt Will Perform Lower Body Dressing: with mod assist;sitting/lateral leans;with adaptive equipment  OT Frequency: Min 2X/week    Co-evaluation PT/OT/SLP Co-Evaluation/Treatment: Yes Reason for Co-Treatment: To address functional/ADL transfers;For patient/therapist safety PT goals addressed during session: Mobility/safety with mobility OT goals addressed during session: ADL's and self-care      AM-PAC OT "6 Clicks" Daily Activity     Outcome Measure Help from another person eating meals?: A Little Help from another person taking care of personal grooming?: A Lot Help from another person toileting, which includes using toliet, bedpan, or urinal?: Total Help from another person bathing (including washing, rinsing, drying)?: A Lot Help from another person to put on and taking off regular upper body clothing?: A Little Help from another person to put on and taking off regular lower body clothing?: Total 6 Click Score: 12   End of Session    Activity Tolerance: Patient limited by fatigue Patient left: in bed;with call bell/phone within reach  OT Visit Diagnosis: Unsteadiness on feet (R26.81);Muscle weakness (generalized) (M62.81);Other symptoms and signs involving cognitive function                Time: 2035-5974 OT Time Calculation (min): 24 min Charges:  OT General Charges $OT Visit: 1  Visit OT Evaluation $OT Re-eval: 1 Re-eval OT Treatments $Self Care/Home Management : 8-22 mins  Dessie Coma, M.S. OTR/L  10/10/21, 4:19 PM  ascom [phone]

## 2021-10-11 DIAGNOSIS — I2699 Other pulmonary embolism without acute cor pulmonale: Secondary | ICD-10-CM | POA: Diagnosis not present

## 2021-10-11 DIAGNOSIS — U071 COVID-19: Secondary | ICD-10-CM | POA: Diagnosis not present

## 2021-10-11 DIAGNOSIS — G9340 Encephalopathy, unspecified: Secondary | ICD-10-CM | POA: Diagnosis not present

## 2021-10-11 LAB — GLUCOSE, CAPILLARY
Glucose-Capillary: 144 mg/dL — ABNORMAL HIGH (ref 70–99)
Glucose-Capillary: 155 mg/dL — ABNORMAL HIGH (ref 70–99)
Glucose-Capillary: 172 mg/dL — ABNORMAL HIGH (ref 70–99)
Glucose-Capillary: 185 mg/dL — ABNORMAL HIGH (ref 70–99)

## 2021-10-11 LAB — BLOOD CULTURE ID PANEL (REFLEXED) - BCID2

## 2021-10-11 LAB — BASIC METABOLIC PANEL
Anion gap: 6 (ref 5–15)
BUN: 37 mg/dL — ABNORMAL HIGH (ref 8–23)
CO2: 23 mmol/L (ref 22–32)
Calcium: 8.7 mg/dL — ABNORMAL LOW (ref 8.9–10.3)
Chloride: 104 mmol/L (ref 98–111)
Creatinine, Ser: 1.01 mg/dL — ABNORMAL HIGH (ref 0.44–1.00)
GFR, Estimated: 60 mL/min — ABNORMAL LOW (ref >60)
Glucose, Bld: 165 mg/dL — ABNORMAL HIGH (ref 70–99)
Potassium: 4.8 mmol/L (ref 3.5–5.1)
Sodium: 133 mmol/L — ABNORMAL LOW (ref 135–145)

## 2021-10-11 NOTE — Progress Notes (Signed)
PROGRESS NOTE    Judith Shaw  OEU:[id number] DOB: Oct 04, 1950 DOA: 09/23/2021 PCP: Pcp, No    Brief Narrative:  Judith Shaw is a 71 y.o. female with history of diabetes mellitus type 2, hyperlipidemia, depression, sleep apnea noncompliant with CPAP prior history of hysterectomy for uterine CA was brought to the ER from skilled nursing facility after patient had a brief episode of loss of consciousness. MRI brain did not show any acute changes. She has elevated D-dimer, CT angiogram chest showed bilateral PE.  She is started on IV heparin.  Switched to Eliquis on 1/29. Patient was awaiting for nursing home placement, she is positive for COVID on 2/13 after a visitor from her sister.  Assessment & Plan:   Principal Problem:   Acute encephalopathy Active Problems:   UTI due to Klebsiella species   Diabetes (HCC)   Chronic diastolic CHF (congestive heart failure) (HCC)   Benign essential HTN   Dementia without behavioral disturbance (HCC)   Syncope   Lethargy   Pressure injury of skin   Bilateral pulmonary embolism (HCC)   Malnutrition of moderate degree  COVID infection. Patient had a recent visit by her sister who has been sick with COVID.  She started have a fever yesterday, COVID swab came back positive. Blood culture had a contaminant. Due to high risk, patient is treated with remdesivir.  Patient does not have hypoxemia, no steroids needed.   Bilateral pulm emboli. Acute metabolic encephalopathy, resolved Syncope and hypotension due to PE.  Continue Eliquis. continue Seroquel 50 mg nightly    Generalized weakness. Frequent falls Placement pending     Klebsiella pneumonia UTI. --s/p a dose of fosfomycin, treated.   Morbid obesity. Patient with a BMI of 38, with associated chronic diastolic congestive heart failure, hypertension and type 2 diabetes.   Type 2 diabetes. --cont glargine 15u nightly --SSI   Essential hypertension --not on BP med PTA --monitor      DVT prophylaxis: Eliquis Code Status: full Family Communication: Sister updated. Disposition Plan: Pending nursing home placement     Status is: Inpatient Remains inpatient appropriate because: Unsafe discharge    I/O last 3 completed shifts: In: 0  Out: 500 [Urine:500] No intake/output data recorded.     Consultants:  none  Procedures: none  Antimicrobials: none  Subjective: Patient no longer has a fever, she has a confusion which is baseline. She has no short of breath, no hypoxia. No abdominal pain or nausea vomiting.  Objective: Vitals:   10/10/21 2027 10/11/21 0143 10/11/21 0539 10/11/21 0913  BP: (!) 129/91 (!) 124/48 133/82 (!) 94/42  Pulse: 70 85 82 68  Resp: 18 18 19 17   Temp:  99.2 F (37.3 C) 98.4 F (36.9 C) 98.3 F (36.8 C)  TempSrc: Oral Oral    SpO2: 92% 93% (!) 88% 96%  Weight:      Height:       No intake or output data in the 24 hours ending 10/11/21 1024 Filed Weights   09/24/21 0532 09/27/21 1102  Weight: 98.1 kg 98.1 kg    Examination:  General exam: Appears calm and comfortable, morbid obese. Respiratory system: Clear to auscultation. Respiratory effort normal. Cardiovascular system: S1 & S2 heard, RRR. No JVD, murmurs, rubs, gallops or clicks. No pedal edema. Gastrointestinal system: Abdomen is nondistended, soft and nontender. No organomegaly or masses felt. Normal bowel sounds heard. Central nervous system: Alert and oriented x2. No focal neurological deficits. Extremities: Symmetric 5 x 5 power. Skin:  No rashes, lesions or ulcers Psychiatry: Judgement and insight appear normal. Mood & affect appropriate.     Data Reviewed: I have personally reviewed following labs and imaging studies  CBC: Recent Labs  Lab 10/04/21 1129 10/07/21 2024 10/10/21 0717  WBC 6.7 6.8 5.2  NEUTROABS  --  4.1  --   HGB 15.0 15.7* 15.0  HCT 46.2* 48.1* 48.2*  MCV 89.9 89.9 93.8  PLT 253 280 212   Basic Metabolic Panel: Recent Labs  Lab  10/04/21 1129 10/07/21 2024 10/08/21 0102 10/10/21 0717 10/11/21 0525  NA 139 137  --  131* 133*  K 4.4 6.1* 4.7 5.2* 4.8  CL 108 102  --  102 104  CO2 25 26  --  21* 23  GLUCOSE 156* 244*  --  164* 165*  BUN 34* 36*  --  33* 37*  CREATININE 0.92 1.06*  --  0.96 1.01*  CALCIUM 9.3 9.6  --  8.7* 8.7*  MG 1.7 1.9  --   --   --   PHOS  --  3.4  --   --   --    GFR: Estimated Creatinine Clearance: 57 mL/min (A) (by C-G formula based on SCr of 1.01 mg/dL (H)). Liver Function Tests: Recent Labs  Lab 10/10/21 0717  AST 29  ALT 19  ALKPHOS 81  BILITOT 0.5  PROT 6.5  ALBUMIN 2.8*   No results for input(s): LIPASE, AMYLASE in the last 168 hours. No results for input(s): AMMONIA in the last 168 hours. Coagulation Profile: No results for input(s): INR, PROTIME in the last 168 hours. Cardiac Enzymes: No results for input(s): CKTOTAL, CKMB, CKMBINDEX, TROPONINI in the last 168 hours. BNP (last 3 results) No results for input(s): PROBNP in the last 8760 hours. HbA1C: No results for input(s): HGBA1C in the last 72 hours. CBG: Recent Labs  Lab 10/10/21 0910 10/10/21 1230 10/10/21 1721 10/10/21 2022 10/11/21 0909  GLUCAP 173* 160* 204* 199* 144*   Lipid Profile: No results for input(s): CHOL, HDL, LDLCALC, TRIG, CHOLHDL, LDLDIRECT in the last 72 hours. Thyroid Function Tests: No results for input(s): TSH, T4TOTAL, FREET4, T3FREE, THYROIDAB in the last 72 hours. Anemia Panel: No results for input(s): VITAMINB12, FOLATE, FERRITIN, TIBC, IRON, RETICCTPCT in the last 72 hours. Sepsis Labs: Recent Labs  Lab 10/10/21 0717 10/10/21 0919  PROCALCITON  --  <0.10  LATICACIDVEN 1.8 1.5    Recent Results (from the past 240 hour(s))  CULTURE, BLOOD (ROUTINE X 2) w Reflex to ID Panel     Status: None (Preliminary result)   Collection Time: 10/10/21  9:19 AM   Specimen: BLOOD  Result Value Ref Range Status   Specimen Description BLOOD RA  Final   Special Requests   Final     BOTTLES DRAWN AEROBIC AND ANAEROBIC Blood Culture adequate volume   Culture  Setup Time   Final    GRAM POSITIVE COCCI ANAEROBIC BOTTLE ONLY Organism ID to follow CRITICAL RESULT CALLED TO, READ BACK BY AND VERIFIED WITH: Corene Cornea Robbins@[email or web address]  10/11/21 RH Performed at [facility], [address], [place], [place] [address]    Culture GRAM POSITIVE COCCI  Final   Report Status PENDING  Incomplete  CULTURE, BLOOD (ROUTINE X 2) w Reflex to ID Panel     Status: None (Preliminary result)   Collection Time: 10/10/21  9:19 AM   Specimen: BLOOD  Result Value Ref Range Status   Specimen Description BLOOD RIGHT ANTECUBITAL  Final   Special Requests  Final    BOTTLES DRAWN AEROBIC AND ANAEROBIC Blood Culture results may not be optimal due to an inadequate volume of blood received in culture bottles   Culture   Final    NO GROWTH < 24 HOURS Performed at [facility], [address] [place], [place] [address]    Report Status PENDING  Incomplete  Blood Culture ID Panel (Reflexed)     Status: Abnormal   Collection Time: 10/10/21  9:19 AM  Result Value Ref Range Status   Enterococcus faecalis NOT DETECTED NOT DETECTED Final   Enterococcus Faecium NOT DETECTED NOT DETECTED Final   Listeria monocytogenes NOT DETECTED NOT DETECTED Final   Staphylococcus species DETECTED (A) NOT DETECTED Final    Comment: CRITICAL RESULT CALLED TO, READ BACK BY AND VERIFIED WITH: JASON ROBBINS@[email or web address]  10/11/21 RH    Staphylococcus aureus (BCID) NOT DETECTED NOT DETECTED Final   Staphylococcus epidermidis DETECTED (A) NOT DETECTED Final    Comment: Methicillin (oxacillin) resistant coagulase negative staphylococcus. Possible blood culture contaminant (unless isolated from more than one blood culture draw or clinical case suggests pathogenicity). No antibiotic treatment is indicated for blood  culture contaminants. JASON ROBBINS@[email or web address]  10/11/21 RH    Staphylococcus lugdunensis NOT DETECTED NOT DETECTED  Final   Streptococcus species NOT DETECTED NOT DETECTED Final   Streptococcus agalactiae NOT DETECTED NOT DETECTED Final   Streptococcus pneumoniae NOT DETECTED NOT DETECTED Final   Streptococcus pyogenes NOT DETECTED NOT DETECTED Final   A.calcoaceticus-baumannii NOT DETECTED NOT DETECTED Final   Bacteroides fragilis NOT DETECTED NOT DETECTED Final   Enterobacterales NOT DETECTED NOT DETECTED Final   Enterobacter cloacae complex NOT DETECTED NOT DETECTED Final   Escherichia coli NOT DETECTED NOT DETECTED Final   Klebsiella aerogenes NOT DETECTED NOT DETECTED Final   Klebsiella oxytoca NOT DETECTED NOT DETECTED Final   Klebsiella pneumoniae NOT DETECTED NOT DETECTED Final   Proteus species NOT DETECTED NOT DETECTED Final   Salmonella species NOT DETECTED NOT DETECTED Final   Serratia marcescens NOT DETECTED NOT DETECTED Final   Haemophilus influenzae NOT DETECTED NOT DETECTED Final   Neisseria meningitidis NOT DETECTED NOT DETECTED Final   Pseudomonas aeruginosa NOT DETECTED NOT DETECTED Final   Stenotrophomonas maltophilia NOT DETECTED NOT DETECTED Final   Candida albicans NOT DETECTED NOT DETECTED Final   Candida auris NOT DETECTED NOT DETECTED Final   Candida glabrata NOT DETECTED NOT DETECTED Final   Candida krusei NOT DETECTED NOT DETECTED Final   Candida parapsilosis NOT DETECTED NOT DETECTED Final   Candida tropicalis NOT DETECTED NOT DETECTED Final   Cryptococcus neoformans/gattii NOT DETECTED NOT DETECTED Final   Methicillin resistance mecA/C DETECTED (A) NOT DETECTED Final    Comment: JASON ROBBINS@[email or web address]  10/11/21 RH Performed at [facility], [address] [place], [place] [address]   Resp Panel by RT-PCR (Flu A&B, Covid) Nasopharyngeal Swab     Status: Abnormal   Collection Time: 10/10/21 12:30 PM   Specimen: Nasopharyngeal Swab; Nasopharyngeal(NP) swabs in vial transport medium  Result Value Ref Range Status   SARS Coronavirus 2 by RT PCR POSITIVE (A) NEGATIVE  Final    Comment: (NOTE) SARS-CoV-2 target nucleic acids are DETECTED.  The SARS-CoV-2 RNA is generally detectable in upper respiratory specimens during the acute phase of infection. Positive results are indicative of the presence of the identified virus, but do not rule out bacterial infection or co-infection with other pathogens not detected by the test. Clinical correlation with patient history and other diagnostic information is necessary to determine  patient infection status. The expected result is Negative.  Fact Sheet for Patients: EntrepreneurPulse.com.au  Fact Sheet for Healthcare Providers: IncredibleEmployment.be  This test is not yet approved or cleared by the [place] FDA and  has been authorized for detection and/or diagnosis of SARS-CoV-2 by FDA under an Emergency Use Authorization (EUA).  This EUA will remain in effect (meaning this test can be used) for the duration of  the COVID-19 declaration under Section 564(b)(1) of the A ct, 21 U.S.C. section 360bbb-3(b)(1), unless the authorization is terminated or revoked sooner.     Influenza A by PCR NEGATIVE NEGATIVE Final   Influenza B by PCR NEGATIVE NEGATIVE Final    Comment: (NOTE) The Xpert Xpress SARS-CoV-2/FLU/RSV plus assay is intended as an aid in the diagnosis of influenza from Nasopharyngeal swab specimens and should not be used as a sole basis for treatment. Nasal washings and aspirates are unacceptable for Xpert Xpress SARS-CoV-2/FLU/RSV testing.  Fact Sheet for Patients: EntrepreneurPulse.com.au  Fact Sheet for Healthcare Providers: IncredibleEmployment.be  This test is not yet approved or cleared by the [place] FDA and has been authorized for detection and/or diagnosis of SARS-CoV-2 by FDA under an Emergency Use Authorization (EUA). This EUA will remain in effect (meaning this test can be used) for the duration of  the COVID-19 declaration under Section 564(b)(1) of the Act, 21 U.S.C. section 360bbb-3(b)(1), unless the authorization is terminated or revoked.  Performed at [facility], [address], [place], [place] [address]          Radiology Studies: [facility] [place] 1 View  Result Date: 10/10/2021 CLINICAL DATA:  Fever EXAM: PORTABLE CHEST 1 VIEW COMPARISON:  Chest x-ray 09/23/2021 FINDINGS: Cardiomegaly and mediastinum appear unchanged. Mild pulmonary vascular prominence. No focal consolidation identified. No pleural effusion or pneumothorax identified. IMPRESSION: Cardiomegaly and mild pulmonary vascular prominence. Electronically Signed   By: Ofilia Neas M.D.   On: 10/10/2021 08:59        Scheduled Meds:  apixaban  5 mg Oral BID   ARIPiprazole  5 mg Oral Daily   vitamin C  500 mg Oral BID   busPIRone  10 mg Oral TID   donepezil  10 mg Oral QHS   feeding supplement (GLUCERNA SHAKE)  237 mL Oral BID BM   gabapentin  300 mg Oral QHS   insulin aspart  0-5 Units Subcutaneous QHS   insulin aspart  0-9 Units Subcutaneous TID WC   insulin glargine-yfgn  15 Units Subcutaneous QHS   multivitamin with minerals  1 tablet Oral Daily   pantoprazole  40 mg Oral Daily   Ensure Max Protein  11 oz Oral QHS   QUEtiapine  75 mg Oral QHS   zinc sulfate  220 mg Oral Daily   Continuous Infusions:  remdesivir 100 mg in NS 100 mL       LOS: 17 days    Time spent: 27 minutes    Sharen Hones, MD [facility]   To contact the attending provider between 7A-7P or the covering provider during after hours 7P-7A, please log into the web site [email or web address] and access using universal [place] password for that web site. If you do not have the password, please call the hospital operator.  10/11/2021, 10:24 AM

## 2021-10-11 NOTE — Progress Notes (Signed)
PHARMACY - PHYSICIAN COMMUNICATION CRITICAL VALUE ALERT - BLOOD CULTURE IDENTIFICATION (BCID)  Judith Shaw is an 71 y.o. female who presented to [facility] on 09/23/2021 with a chief complaint of COVID.  Assessment:  Staph Epi in 1 of 4 bottles, (anaerobic), Mec A detected, most likely is contaminant.  (include suspected source if known)  Name of physician (or Provider) Contacted: Neomia Glass, NP   Current antibiotics: none, on Remdesivir for COVID  Changes to prescribed antibiotics recommended:  NP does not want to start abx  Results for orders placed or performed during the hospital encounter of 09/23/21  Blood Culture ID Panel (Reflexed) (Collected: 10/10/2021  9:19 AM)  Result Value Ref Range   Enterococcus faecalis NOT DETECTED NOT DETECTED   Enterococcus Faecium NOT DETECTED NOT DETECTED   Listeria monocytogenes NOT DETECTED NOT DETECTED   Staphylococcus species DETECTED (A) NOT DETECTED   Staphylococcus aureus (BCID) NOT DETECTED NOT DETECTED   Staphylococcus epidermidis DETECTED (A) NOT DETECTED   Staphylococcus lugdunensis NOT DETECTED NOT DETECTED   Streptococcus species NOT DETECTED NOT DETECTED   Streptococcus agalactiae NOT DETECTED NOT DETECTED   Streptococcus pneumoniae NOT DETECTED NOT DETECTED   Streptococcus pyogenes NOT DETECTED NOT DETECTED   A.calcoaceticus-baumannii NOT DETECTED NOT DETECTED   Bacteroides fragilis NOT DETECTED NOT DETECTED   Enterobacterales NOT DETECTED NOT DETECTED   Enterobacter cloacae complex NOT DETECTED NOT DETECTED   Escherichia coli NOT DETECTED NOT DETECTED   Klebsiella aerogenes NOT DETECTED NOT DETECTED   Klebsiella oxytoca NOT DETECTED NOT DETECTED   Klebsiella pneumoniae NOT DETECTED NOT DETECTED   Proteus species NOT DETECTED NOT DETECTED   Salmonella species NOT DETECTED NOT DETECTED   Serratia marcescens NOT DETECTED NOT DETECTED   Haemophilus influenzae NOT DETECTED NOT DETECTED   Neisseria meningitidis NOT DETECTED NOT  DETECTED   Pseudomonas aeruginosa NOT DETECTED NOT DETECTED   Stenotrophomonas maltophilia NOT DETECTED NOT DETECTED   Candida albicans NOT DETECTED NOT DETECTED   Candida auris NOT DETECTED NOT DETECTED   Candida glabrata NOT DETECTED NOT DETECTED   Candida krusei NOT DETECTED NOT DETECTED   Candida parapsilosis NOT DETECTED NOT DETECTED   Candida tropicalis NOT DETECTED NOT DETECTED   Cryptococcus neoformans/gattii NOT DETECTED NOT DETECTED   Methicillin resistance mecA/C DETECTED (A) NOT DETECTED    Cashe Gatt D 10/11/2021  4:17 AM

## 2021-10-11 NOTE — Progress Notes (Signed)
Patient is agitated/anxious, she pulled out a second IV and is refusing to be stuck again. PRN ativan was given. Discussed with MD, who is ok with waiting until tomorrow morning to insert another IV

## 2021-10-11 NOTE — Progress Notes (Signed)
°   10/11/21 0143  Vitals  Temp 99.2 F (37.3 C)  BP (!) 124/48  MAP (mmHg) 69  BP Location Right Arm  BP Method Automatic  Patient Position (if appropriate) Lying  Pulse Rate 85  Pulse Rate Source Monitor  Resp 18  MEWS COLOR  MEWS Score Color Green  Oxygen Therapy  SpO2 93 %  O2 Device Room Air   Due to orders to notify provider of DBP less than 60. K. Foust NP notified no new orders no changes in last assessment decreased anxiety noted.

## 2021-10-11 NOTE — Plan of Care (Signed)
Pt alert and oriented to self. Fluctuates with orientation to place and situation. Ativan given prn for anxiety. It was effective at decreasing anxiety so pt could rest. DBP below 60 MD aware no new orders. Pt sats flucuate 88-93% pt refuses oxgyen with coughing sats will increase. Updated sister [name] this am.  Problem: Activity: Goal: Risk for activity intolerance will decrease Outcome: Not Progressing   Problem: Nutrition: Goal: Adequate nutrition will be maintained Outcome: Not Progressing   Problem: Coping: Goal: Level of anxiety will decrease Outcome: Not Progressing   Problem: Education: Goal: Knowledge of General Education information will improve Description: Including pain rating scale, medication(s)/side effects and non-pharmacologic comfort measures Outcome: Progressing   Problem: Health Behavior/Discharge Planning: Goal: Ability to manage health-related needs will improve Outcome: Progressing   Problem: Clinical Measurements: Goal: Ability to maintain clinical measurements within normal limits will improve Outcome: Progressing Goal: Will remain free from infection Outcome: Progressing Goal: Diagnostic test results will improve Outcome: Progressing Goal: Respiratory complications will improve Outcome: Progressing Goal: Cardiovascular complication will be avoided Outcome: Progressing   Problem: Elimination: Goal: Will not experience complications related to bowel motility Outcome: Progressing Goal: Will not experience complications related to urinary retention Outcome: Progressing   Problem: Pain Managment: Goal: General experience of comfort will improve Outcome: Progressing   Problem: Safety: Goal: Ability to remain free from injury will improve Outcome: Progressing   Problem: Skin Integrity: Goal: Risk for impaired skin integrity will decrease Outcome: Progressing

## 2021-10-12 DIAGNOSIS — Z794 Long term (current) use of insulin: Secondary | ICD-10-CM

## 2021-10-12 DIAGNOSIS — U071 COVID-19: Secondary | ICD-10-CM | POA: Diagnosis not present

## 2021-10-12 DIAGNOSIS — E669 Obesity, unspecified: Secondary | ICD-10-CM

## 2021-10-12 DIAGNOSIS — E119 Type 2 diabetes mellitus without complications: Secondary | ICD-10-CM

## 2021-10-12 DIAGNOSIS — F0390 Unspecified dementia without behavioral disturbance: Secondary | ICD-10-CM | POA: Diagnosis not present

## 2021-10-12 DIAGNOSIS — I2699 Other pulmonary embolism without acute cor pulmonale: Secondary | ICD-10-CM | POA: Diagnosis not present

## 2021-10-12 DIAGNOSIS — L89152 Pressure ulcer of sacral region, stage 2: Secondary | ICD-10-CM

## 2021-10-12 DIAGNOSIS — N390 Urinary tract infection, site not specified: Secondary | ICD-10-CM

## 2021-10-12 DIAGNOSIS — B9689 Other specified bacterial agents as the cause of diseases classified elsewhere: Secondary | ICD-10-CM

## 2021-10-12 DIAGNOSIS — E440 Moderate protein-calorie malnutrition: Secondary | ICD-10-CM

## 2021-10-12 LAB — BASIC METABOLIC PANEL
Anion gap: 6 (ref 5–15)
BUN: 34 mg/dL — ABNORMAL HIGH (ref 8–23)
CO2: 23 mmol/L (ref 22–32)
Calcium: 8.9 mg/dL (ref 8.9–10.3)
Chloride: 106 mmol/L (ref 98–111)
Creatinine, Ser: 0.91 mg/dL (ref 0.44–1.00)
GFR, Estimated: >60 mL/min (ref >60)
Glucose, Bld: 157 mg/dL — ABNORMAL HIGH (ref 70–99)
Potassium: 4.3 mmol/L (ref 3.5–5.1)
Sodium: 135 mmol/L (ref 135–145)

## 2021-10-12 LAB — GLUCOSE, CAPILLARY
Glucose-Capillary: 138 mg/dL — ABNORMAL HIGH (ref 70–99)
Glucose-Capillary: 169 mg/dL — ABNORMAL HIGH (ref 70–99)
Glucose-Capillary: 235 mg/dL — ABNORMAL HIGH (ref 70–99)
Glucose-Capillary: 187 mg/dL — ABNORMAL HIGH (ref 70–99)

## 2021-10-12 LAB — PHOSPHORUS: Phosphorus: 4 mg/dL (ref 2.5–4.6)

## 2021-10-12 LAB — C-REACTIVE PROTEIN: CRP: 9.9 mg/dL — ABNORMAL HIGH (ref <1.0)

## 2021-10-12 LAB — MAGNESIUM: Magnesium: 1.8 mg/dL (ref 1.7–2.4)

## 2021-10-12 MED ORDER — FLUTICASONE PROPIONATE 50 MCG/ACT NA SUSP
2.0000 | Freq: Every day | NASAL | Status: DC
  Administered 2021-10-12 – 2021-10-20 (×7): 2 via NASAL
  Filled 2021-10-12: qty 16

## 2021-10-12 MED ORDER — FUROSEMIDE 20 MG PO TABS
20.0000 mg | ORAL_TABLET | Freq: Every day | ORAL | Status: DC
  Administered 2021-10-12 – 2021-10-21 (×10): 20 mg via ORAL
  Filled 2021-10-12 (×10): qty 1

## 2021-10-12 MED ORDER — TUBERCULIN PPD 5 UNIT/0.1ML ID SOLN
5.0000 [IU] | Freq: Once | INTRADERMAL | Status: AC
  Administered 2021-10-12: 15:00:00 5 [IU] via INTRADERMAL
  Filled 2021-10-12: qty 0.1

## 2021-10-12 NOTE — Assessment & Plan Note (Addendum)
No signs of heart failure currently.  Last EF 55 to 60%.  On low-dose Lasix.

## 2021-10-12 NOTE — Assessment & Plan Note (Signed)
Dietitian following.  Continue to encourage eating.

## 2021-10-12 NOTE — Progress Notes (Signed)
Progress Note   Patient: Judith Shaw VOZ:[id number] DOB: 09-23-1950 DOA: 09/23/2021     18 DOS: the patient was seen and examined on 10/12/2021     Assessment and Plan: * COVID-19 virus infection Convince patient to get her third dose of remdesivir today.  IV placed by nurse.  Bilateral pulmonary embolism ([place])- (present on admission) Continue Eliquis.  UTI due to Klebsiella species Treated with 1 dose of fosfomycin.  Dementia without behavioral disturbance ([place])- (present on admission) The patient did have a moment of clarity and was able to talk to me well while I was in the room.  We were able to convince her to get an IV today and get the third dose of remdesivir.  Continue psychiatric medications.  Chronic diastolic CHF (congestive heart failure) ([place])- (present on admission) No signs of heart failure currently.  Last EF 55 to 60%.  We will add low-dose Lasix.  Malnutrition of moderate degree Dietitian following.  Continue to encourage eating.  Diabetes ([place]) Patient on low-dose glargine insulin and sliding scale.  Pressure injury of skin Stage II coccyx, present on admission see full description below.  Obesity (BMI 30-39.9) BMI 38.31 calculated by me.        Subjective: The patient was agreeable for an IV to be placed.  As per nursing staff has yanked out prior IVs.  The patient's major complaint is that she wanted to sit in the chair.  Does have a little nose congestion was okay for me prescribing a nose spray.  As per patient's sister the patient has declined rapidly since October and she had to become the POA.  Physical Exam: Vitals:   10/12/21 0031 10/12/21 0403 10/12/21 0812 10/12/21 0815  BP: (!) 110/55 (!) 110/56 (!) 196/170 (!) 123/94  Pulse: 62 (!) 58 (!) 57 (!) 57  Resp: 16 17 17    Temp: 97.9 F (36.6 C) 98.1 F (36.7 C) 97.9 F (36.6 C)   TempSrc:      SpO2: (!) 88% (!) 89% (!) 89% (!) 88%  Weight:      Height:       Physical Exam HENT:      Head: Normocephalic.     Mouth/Throat:     Pharynx: No oropharyngeal exudate.  Eyes:     General: Lids are normal.     Conjunctiva/sclera: Conjunctivae normal.  Cardiovascular:     Rate and Rhythm: Normal rate and regular rhythm.     Heart sounds: Normal heart sounds, S1 normal and S2 normal.  Pulmonary:     Breath sounds: No decreased breath sounds, wheezing, rhonchi or rales.  Abdominal:     Palpations: Abdomen is soft.     Tenderness: There is no abdominal tenderness.  Musculoskeletal:     Right lower leg: No swelling.     Left lower leg: No swelling.  Skin:    General: Skin is warm.     Findings: No rash.  Neurological:     Mental Status: She is alert.     Comments: Answers simple questions appropriately.     Data Reviewed: Laboratory and radiological data during the hospital stay reviewed.  Family Communication: Spoke with patient's sister on the phone  Disposition: Status is: Inpatient Remains inpatient appropriate because: Just diagnosed with COVID infection will need to be here through her isolation.  Planned Discharge Destination: Rehab  Case discussed with transitional care team.  PPD needed for her facility.  Author: Loletha Grayer, MD 10/12/2021 2:05 PM  For  on call review [email or web address].

## 2021-10-12 NOTE — Assessment & Plan Note (Addendum)
On Eliquis

## 2021-10-12 NOTE — Plan of Care (Signed)
Pt alert and oriented 1-2. Person/place at times. Pt yells out at times. Slept most of night. Pt received bath this am with linen change. Sacral foam changed.  Problem: Education: Goal: Knowledge of General Education information will improve Description: Including pain rating scale, medication(s)/side effects and non-pharmacologic comfort measures Outcome: Progressing   Problem: Health Behavior/Discharge Planning: Goal: Ability to manage health-related needs will improve Outcome: Progressing   Problem: Clinical Measurements: Goal: Ability to maintain clinical measurements within normal limits will improve Outcome: Progressing Goal: Will remain free from infection Outcome: Progressing Goal: Diagnostic test results will improve Outcome: Progressing Goal: Respiratory complications will improve Outcome: Progressing Goal: Cardiovascular complication will be avoided Outcome: Progressing   Problem: Activity: Goal: Risk for activity intolerance will decrease Outcome: Progressing   Problem: Nutrition: Goal: Adequate nutrition will be maintained Outcome: Progressing   Problem: Coping: Goal: Level of anxiety will decrease Outcome: Progressing   Problem: Elimination: Goal: Will not experience complications related to bowel motility Outcome: Progressing Goal: Will not experience complications related to urinary retention Outcome: Progressing   Problem: Pain Managment: Goal: General experience of comfort will improve Outcome: Progressing   Problem: Safety: Goal: Ability to remain free from injury will improve Outcome: Progressing   Problem: Skin Integrity: Goal: Risk for impaired skin integrity will decrease Outcome: Progressing

## 2021-10-12 NOTE — Assessment & Plan Note (Addendum)
Patient on glargine insulin and sliding scale.  Sugars elevated with steroids.  Tapering steroids.

## 2021-10-12 NOTE — Assessment & Plan Note (Addendum)
Decreased to 15 mg of prednisone for tomorrow.  Will hopefully taper quickly.  Chest x-ray does not show pneumonia.  Completed 5 days of remdesivir.

## 2021-10-12 NOTE — Progress Notes (Signed)
Nutrition Follow-up  DOCUMENTATION CODES:   Non-severe (moderate) malnutrition in context of chronic illness, Obesity unspecified  INTERVENTION:   -Continue with carb modified diet -Continue 500 mg vitamin C BID -Continue 220 mg zinc sulfate daily x 14 days -Continue feeding assistance with meals -Continue Ensure Max po daily, each supplement provides 150 kcal and 30 grams of protein -Continue MVI with minerals daily -Sugar free Mighty Shake TID with meals, each supplement provides 220 kcals and 6 grams protein  NUTRITION DIAGNOSIS:   Moderate Malnutrition related to chronic illness (dementia) as evidenced by mild fat depletion, mild muscle depletion, percent weight loss.  Ongoing  GOAL:   Patient will meet greater than or equal to 90% of their needs  Progressing   MONITOR:   PO intake, Supplement acceptance, Labs, Weight trends, Skin, I & O's  REASON FOR ASSESSMENT:   Low [place]    ASSESSMENT:   Judith Shaw is a 71 y.o. female with history of diabetes mellitus type 2, hyperlipidemia, depression, sleep apnea noncompliant with CPAP prior history of hysterectomy for uterine CA was brought to the ER from skilled nursing facility after patient had a brief episode of loss of consciousness.  As per the patient's sister patient has become more lethargic over the last 5 days.  Patient has been eating less.  Has been more bedbound and physical therapy has been trying to make her ambulate but finds it difficult.  2 days ago with some of her depression medications were changed her BuSpar dose was increased.  Patient has become more lethargic.  This morning patient was attempted to stand up when patient lost consciousness briefly and fell onto back to her bed.  Since then patient has become more lethargic.  No seizure-like activity was noted.  Patient has not complained of any chest pain shortness of breath nausea vomiting or diarrhea.  Reviewed I/O's: -390 ml x 24 hours and -4.3 L since  09/28/21  UOP: 750 ml x 24 hours  Pt unavailable at time of visit. Attempted to speak with pt via call to hospital room phone, however, unable to reach.   Pt with variable oral intake. Noted meal completions 0-100%. Pt is variable acceptance of Glucerna and Ensure Max supplements.   Per TOC notes, plan to transfer to SNF once medically stable.   Medications reviewed and include remdesivir.   Labs reviewed: CBGS: [id number] (inpatient orders for glycemic control are 0-5 units insulin aspart daily at bedtime, 0-9 units insulin aspart TID with meals, and 15 units insulin glargine-yfgn daily at bedtime).    Diet Order:   Diet Order             Diet Carb Modified Fluid consistency: Thin; Room service appropriate? Yes with Assist  Diet effective now                   EDUCATION NEEDS:   Education needs have been addressed  Skin:  Skin Assessment: Skin Integrity Issues: Skin Integrity Issues:: Stage II Stage II: coccyx  Last BM:  10/10/21  Height:   Ht Readings from Last 1 Encounters:  09/27/21 5\' 3"  (1.6 m)    Weight:   Wt Readings from Last 1 Encounters:  09/27/21 98.1 kg    Ideal Body Weight:  52.3 kg  BMI:  Body mass index is 38.31 kg/m.  Estimated Nutritional Needs:   Kcal:  1850-2050  Protein:  115-130 grams  Fluid:  > 1.8 L    Loistine Chance, RD, LDN, CDCES  Registered Dietitian II Certified Diabetes Care and Education Specialist Please refer to [facility] for RD and/or RD on-call/weekend/after hours pager

## 2021-10-12 NOTE — Assessment & Plan Note (Addendum)
Most recent BMI 36.6.

## 2021-10-12 NOTE — NC FL2 (Signed)
[place] LEVEL OF CARE SCREENING TOOL     IDENTIFICATION  Patient Name: Judith Shaw Birthdate: 08/10/1951 Sex: female Admission Date (Current Location): 09/23/2021  [facility] and [place] Number:  Engineering geologist and Address:  [facility], [address], [place], [place] [address]      Provider Number: [id number]  Attending Physician Name and Address:  Loletha Grayer, MD  Relative Name and Phone Number:  [name] [phone]    Current Level of Care: Hospital Recommended Level of Care: [place] Prior Approval Number:    Date Approved/Denied:   PASRR Number:    Discharge Plan: Other (Comment) (ALF)    Current Diagnoses: Patient Active Problem List   Diagnosis Date Noted   Obesity (BMI 30-39.9) 10/12/2021   COVID-19 virus infection 10/11/2021   Malnutrition of moderate degree 10/08/2021   Pressure injury of skin 09/24/2021   Bilateral pulmonary embolism ([place]) 09/24/2021   Syncope 09/23/2021   Lethargy 09/23/2021   Acute encephalopathy 09/23/2021   History of insulin dependent diabetes mellitus    Aspiration pneumonia of lower lobe (HCC)    Lethargic    Encephalopathy 98/33/8250   Acute metabolic encephalopathy 53/97/6734   Acute pyelonephritis 07/28/2021   Severe sepsis ([place]) 07/28/2021   Severe sepsis with acute organ dysfunction ([place]) 07/27/2021   Atherosclerosis of native arteries of the extremities with ulceration ([place]) 02/24/2020   S/P amputation of lesser toe, left ([place]) 01/13/2020   Dementia without behavioral disturbance ([place]) 12/16/2019   Acute osteomyelitis of left foot ([place]) 12/15/2019   Memory impairment 11/30/2019   Complaints of memory disturbance 11/20/2019   Diabetic ulcer of left midfoot associated with type 2 diabetes mellitus ([place]) 11/18/2019   Seizure-like activity ([place]) 07/15/2019   Intractable 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 06/19/2019    Uncontrolled type 2 diabetes mellitus with hyperglycemia, with long-term current use of insulin ([place]) 04/22/2019   Panic attacks 03/26/2019   Benign essential HTN 07/19/2018   Fatty liver 05/15/2018   Hx of adenomatous colonic polyps 05/15/2018   Type 2 diabetes mellitus with diabetic nephropathy, with long-term current use of insulin ([place]) 05/01/2018   Tinnitus, bilateral 04/05/2017   Concussion syndrome 04/03/2017   Concussion without loss of consciousness 01/24/2017   Difficulty walking 01/24/2017   Headache disorder 01/24/2017   Numbness and tingling 01/24/2017   Postural urinary incontinence 01/24/2017   Sepsis ([place]) 09/21/2016   UTI due to Klebsiella species 09/21/2016   HTN (hypertension) 09/21/2016   Diabetes ([place]) 09/21/2016   Depression 09/21/2016   Health care maintenance 10/05/2015   Recurrent major depressive disorder, in full remission ([place]) 06/16/2014   DM (diabetes mellitus) type II controlled, neurological manifestation ([place]) 06/12/2014   Morbid obesity ([place]) 06/12/2014   Hyperlipidemia 03/10/2014   Chronic diastolic CHF (congestive heart failure) ([place]) 19/37/9024   H/O diastolic dysfunction 09/73/5329   Sleep apnea 03/10/2014   SOB (shortness of breath) 03/10/2014    Orientation RESPIRATION BLADDER Height & Weight     Self, Time, Situation, Place  Normal Incontinent Weight: 98.1 kg Height:  5\' 3"  (160 cm)  BEHAVIORAL SYMPTOMS/MOOD NEUROLOGICAL BOWEL NUTRITION STATUS      Incontinent Diet  AMBULATORY STATUS COMMUNICATION OF NEEDS Skin   Extensive Assist Verbally PU Stage and Appropriate Care   PU Stage 2 Dressing: Daily (PRN Foam Dsg to Coccyx)                   Personal Care Assistance  Level of Assistance  Bathing, Feeding, Dressing Bathing Assistance: Maximum assistance Feeding assistance: Independent Dressing Assistance: Maximum assistance     Functional Limitations Info  Sight, Hearing, Speech Sight Info: Adequate Hearing Info:  Adequate Speech Info: Adequate    SPECIAL CARE FACTORS FREQUENCY  PT (By licensed PT), OT (By licensed OT)     PT Frequency: 5x OT Frequency: 5x            Contractures Contractures Info: Not present    Additional Factors Info  Code Status, Allergies Code Status Info: Full Code Allergies Info: Codeine           Current Medications (10/12/2021):  This is the current hospital active medication list Current Facility-Administered Medications  Medication Dose Route Frequency Provider Last Rate Last Admin   acetaminophen (TYLENOL) tablet 650 mg  650 mg Oral Q6H PRN Rise Patience, MD   650 mg at 10/10/21 1526   Or   acetaminophen (TYLENOL) suppository 650 mg  650 mg Rectal Q6H PRN Rise Patience, MD       apixaban Arne Cleveland) tablet 5 mg  5 mg Oral BID Benita Gutter, RPH   5 mg at 10/12/21 1053   ARIPiprazole (ABILIFY) tablet 5 mg  5 mg Oral Daily Sharen Hones, MD   5 mg at 10/12/21 1146   ascorbic acid (VITAMIN C) tablet 500 mg  500 mg Oral BID Sharen Hones, MD   500 mg at 10/12/21 1053   busPIRone (BUSPAR) tablet 10 mg  10 mg Oral TID Sharen Hones, MD   10 mg at 10/12/21 1053   donepezil (ARICEPT) tablet 10 mg  10 mg Oral QHS Sharen Hones, MD   10 mg at 10/11/21 2059   feeding supplement (GLUCERNA SHAKE) (GLUCERNA SHAKE) liquid 237 mL  237 mL Oral BID BM Sharen Hones, MD   237 mL at 10/12/21 1241   fluticasone (FLONASE) 50 MCG/ACT nasal spray 2 spray  2 spray Each Nare Daily Loletha Grayer, MD   2 spray at 10/12/21 1152   gabapentin (NEURONTIN) capsule 300 mg  300 mg Oral QHS Sharen Hones, MD   300 mg at 10/11/21 2059   hydrALAZINE (APRESOLINE) injection 5 mg  5 mg Intravenous Q4H PRN Rise Patience, MD       insulin aspart (novoLOG) injection 0-5 Units  0-5 Units Subcutaneous QHS Sharen Hones, MD   3 Units at 10/07/21 2142   insulin aspart (novoLOG) injection 0-9 Units  0-9 Units Subcutaneous TID WC Sharen Hones, MD   2 Units at 10/12/21 1241   insulin  glargine-yfgn (SEMGLEE) injection 15 Units  15 Units Subcutaneous QHS Rise Patience, MD   15 Units at 10/11/21 2059   loperamide (IMODIUM) capsule 2 mg  2 mg Oral PRN Sharen Hones, MD   2 mg at 10/10/21 1755   loperamide (IMODIUM) capsule 2 mg  2 mg Oral PRN Mansy, Jan A, MD       LORazepam (ATIVAN) tablet 0.5 mg  0.5 mg Oral Q8H PRN Sharen Hones, MD   0.5 mg at 10/11/21 1631   multivitamin with minerals tablet 1 tablet  1 tablet Oral Daily Sharen Hones, MD   1 tablet at 10/12/21 1053   pantoprazole (PROTONIX) EC tablet 40 mg  40 mg Oral Daily Sharen Hones, MD   40 mg at 10/12/21 1053   phenol (CHLORASEPTIC) mouth spray 1 spray  1 spray Mouth/Throat PRN Sharen Hones, MD       protein supplement (ENSURE MAX)  liquid  11 oz Oral QHS Sharen Hones, MD   11 oz at 10/11/21 2059   QUEtiapine (SEROQUEL) tablet 75 mg  75 mg Oral QHS Enzo Bi, MD   75 mg at 10/11/21 2059   tuberculin injection 5 Units  5 Units Intradermal Once Loletha Grayer, MD       zinc sulfate capsule 220 mg  220 mg Oral Daily Sharen Hones, MD   220 mg at 10/12/21 1053     Discharge Medications: Please see discharge summary for a list of discharge medications.  Relevant Imaging Results:  Relevant Lab Results:   Additional Information SS# [id number]  Anselm Pancoast, RN

## 2021-10-12 NOTE — TOC Progression Note (Addendum)
Transition of Care [facility]) - Progression Note    Patient Details  Name: Judith Shaw MRN: [id number] Date of Birth: 1950-10-29  Transition of Care [facility]) CM/SW [place], RN Phone Number: 10/12/2021, 11:59 AM  Clinical Narrative:    Damaris Schooner to Danielle @ [facility] to confirm that previously arranged placement has agreed to hold bed until patient is medically cleared for discharge related to [place]. Will continue to follow for discharge.   Will need TB test completed and plan for discharge on Monday 10/17/21. Facility is requesting order for hospital bed.    Expected Discharge Plan: [place] Barriers to Discharge: Continued Medical Work up  Expected Discharge Plan and Services Expected Discharge Plan: [place]: [place] (LTAC) Living arrangements for the past 2 months: [place]                                       Social Determinants of Health (SDOH) Interventions    Readmission Risk Interventions Readmission Risk Prevention Plan 08/12/2021  Transportation Screening Complete  PCP or Specialist Appt within 3-5 Days Complete  HRI or [place] Complete  Social Work Consult for [place] Planning/Counseling Complete  Palliative Care Screening Not Applicable  Medication Review Press photographer) Complete  Some recent data might be hidden

## 2021-10-12 NOTE — Assessment & Plan Note (Signed)
Stage II coccyx, present on admission see full description below.

## 2021-10-12 NOTE — Assessment & Plan Note (Signed)
Treated with 1 dose of fosfomycin.

## 2021-10-12 NOTE — Assessment & Plan Note (Addendum)
Mental status was good today when I saw her.  Answered all questions appropriately.

## 2021-10-13 DIAGNOSIS — U071 COVID-19: Secondary | ICD-10-CM | POA: Diagnosis not present

## 2021-10-13 DIAGNOSIS — N390 Urinary tract infection, site not specified: Secondary | ICD-10-CM | POA: Diagnosis not present

## 2021-10-13 DIAGNOSIS — F0390 Unspecified dementia without behavioral disturbance: Secondary | ICD-10-CM | POA: Diagnosis not present

## 2021-10-13 DIAGNOSIS — I2699 Other pulmonary embolism without acute cor pulmonale: Secondary | ICD-10-CM | POA: Diagnosis not present

## 2021-10-13 LAB — CULTURE, BLOOD (ROUTINE X 2): Special Requests: ADEQUATE

## 2021-10-13 LAB — GLUCOSE, CAPILLARY
Glucose-Capillary: 155 mg/dL — ABNORMAL HIGH (ref 70–99)
Glucose-Capillary: 132 mg/dL — ABNORMAL HIGH (ref 70–99)
Glucose-Capillary: 205 mg/dL — ABNORMAL HIGH (ref 70–99)
Glucose-Capillary: 203 mg/dL — ABNORMAL HIGH (ref 70–99)

## 2021-10-13 MED ORDER — GUAIFENESIN-DM 100-10 MG/5ML PO SYRP
5.0000 mL | ORAL_SOLUTION | ORAL | Status: DC | PRN
  Administered 2021-10-13: 5 mL via ORAL
  Filled 2021-10-13: qty 5

## 2021-10-13 MED ORDER — SODIUM CHLORIDE 0.9 % IV SOLN
100.0000 mg | Freq: Every day | INTRAVENOUS | Status: AC
  Administered 2021-10-13 – 2021-10-14 (×2): 100 mg via INTRAVENOUS
  Filled 2021-10-13 (×2): qty 100

## 2021-10-13 MED ORDER — METHYLPREDNISOLONE SODIUM SUCC 125 MG IJ SOLR
80.0000 mg | Freq: Every day | INTRAMUSCULAR | Status: DC
  Administered 2021-10-13 – 2021-10-14 (×2): 80 mg via INTRAVENOUS
  Filled 2021-10-13 (×3): qty 2

## 2021-10-13 MED ORDER — IPRATROPIUM-ALBUTEROL 20-100 MCG/ACT IN AERS
2.0000 | INHALATION_SPRAY | Freq: Four times a day (QID) | RESPIRATORY_TRACT | Status: DC
  Administered 2021-10-13 – 2021-10-20 (×26): 2 via RESPIRATORY_TRACT
  Filled 2021-10-13: qty 4

## 2021-10-13 MED ORDER — BENZONATATE 100 MG PO CAPS
100.0000 mg | ORAL_CAPSULE | Freq: Three times a day (TID) | ORAL | Status: DC
  Administered 2021-10-13 – 2021-10-21 (×24): 100 mg via ORAL
  Filled 2021-10-13 (×24): qty 1

## 2021-10-13 NOTE — Progress Notes (Signed)
Progress Note   Patient: Judith Shaw EHU:[id number] DOB: Feb 01, 1951 DOA: 09/23/2021     19 DOS: the patient was seen and examined on 10/13/2021   Assessment and Plan: * COVID-19 virus infection Since the patient was continuously coughing while is in the room, I added IV Solu-Medrol, Combivent inhaler and since she did have the IV and we will do 2 more days of remdesivir to complete 5 days.  Repeat chest x-ray tomorrow morning.  Bilateral pulmonary embolism ([place])- (present on admission) Continue Eliquis.  UTI due to Klebsiella species Treated with 1 dose of fosfomycin.  Dementia without behavioral disturbance ([place])- (present on admission) The patient was continuously coughing today.  Did answer few questions appropriately.  Chronic diastolic CHF (congestive heart failure) ([place])- (present on admission) No signs of heart failure currently.  Last EF 55 to 60%.  On low-dose Lasix.  Malnutrition of moderate degree Dietitian following.  Continue to encourage eating.  Diabetes ([place]) Patient on low-dose glargine insulin and sliding scale.  With adding steroids watch sugars closely.  Pressure injury of skin Stage II coccyx, present on admission see full description below.  Obesity (BMI 30-39.9) BMI 38.31 calculated by me.        Subjective: Patient was coughing continuously while is in the room this morning.  She was asking when this is going to stop.  I told her that usually takes some time to get over COVID infection.  Physical Exam: Vitals:   10/13/21 0009 10/13/21 0307 10/13/21 1146 10/13/21 1200  BP: (!) 119/52 127/62 (!) 108/54   Pulse: (!) 59 (!) 55 (!) 56   Resp: 18 18 15    Temp: 97.7 F (36.5 C) 97.7 F (36.5 C) 97.7 F (36.5 C)   TempSrc: Oral Oral Oral   SpO2: 91% 92% 91% 94%  Weight:      Height:       Physical Exam HENT:     Head: Normocephalic.     Mouth/Throat:     Pharynx: No oropharyngeal exudate.  Eyes:     General: Lids are normal.      Conjunctiva/sclera: Conjunctivae normal.  Cardiovascular:     Rate and Rhythm: Normal rate and regular rhythm.     Heart sounds: Normal heart sounds, S1 normal and S2 normal.  Pulmonary:     Breath sounds: Examination of the right-middle field reveals wheezing. Examination of the left-middle field reveals wheezing. Examination of the right-lower field reveals decreased breath sounds and rhonchi. Examination of the left-lower field reveals decreased breath sounds and rhonchi. Decreased breath sounds, wheezing and rhonchi present. No rales.  Abdominal:     Palpations: Abdomen is soft.     Tenderness: There is no abdominal tenderness.  Musculoskeletal:     Right lower leg: No swelling.     Left lower leg: No swelling.  Skin:    General: Skin is warm.     Findings: No rash.  Neurological:     Mental Status: She is alert.     Comments: Answers simple questions appropriately.     Data Reviewed: Reviewed laboratory data from yesterday with CRP of 9.9 and creatinine 0.91.  Reviewed blood sugars from today 155 from 132.  Family Communication: Updated patient's sister on the phone  Disposition: Status is: Inpatient Remains inpatient appropriate because: We will need to await COVID isolation and prior to going out to facility  Planned Discharge Destination: Rehab  Author: Loletha Grayer, MD 10/13/2021 2:47 PM  For on call review [email or web address].

## 2021-10-13 NOTE — Progress Notes (Signed)
PHARMACIST - PHYSICIAN COMMUNICATION   CONCERNING: Methylprednisolone IV    Current order: Methylprednisolone IV 40 mg BID   DESCRIPTION: Per [place] Protocol:   IV methylprednisolone will be converted to either a q12h or q24h frequency with the same total daily dose (TDD).  Ordered Dose: 1 to 125 mg TDD; convert to: TDD q24h.  Ordered Dose: 126 to 250 mg TDD; convert to: TDD div q12h.  Ordered Dose: >250 mg TDD; DAW.  Order has been adjusted to: Methylprednisolone IV 80 mg daily   Benita Gutter  10/13/2021 11:29 AM

## 2021-10-13 NOTE — TOC Progression Note (Signed)
Transition of Care [facility]) - Progression Note    Patient Details  Name: Judith Shaw MRN: [id number] Date of Birth: 1951-01-27  Transition of Care [facility]) CM/SW [place], RN Phone Number: 10/13/2021, 3:36 PM  Clinical Narrative:    Damaris Schooner with [name], POA who is requesting follow up on discharge plan. Confirmed patient is scheduled to discharge Monday 10/17/21 if medically cleared. [name] wants to ensure that patient has w/c, hospital bed, mattress overlay and pulse ox. [name] is interested in researching personal call bell options.    Expected Discharge Plan: [place] Barriers to Discharge: Continued Medical Work up  Expected Discharge Plan and Services Expected Discharge Plan: [place]: [place] (LTAC) Living arrangements for the past 2 months: [place]                                       Social Determinants of Health (SDOH) Interventions    Readmission Risk Interventions Readmission Risk Prevention Plan 08/12/2021  Transportation Screening Complete  PCP or Specialist Appt within 3-5 Days Complete  HRI or [place] Complete  Social Work Consult for [place] Planning/Counseling Complete  Palliative Care Screening Not Applicable  Medication Review Press photographer) Complete  Some recent data might be hidden

## 2021-10-13 NOTE — Progress Notes (Signed)
Patient sleeping soundly. Arouses briefly to verbal stimulation, states "go away" and goes back to sleep.

## 2021-10-13 NOTE — Plan of Care (Signed)
°  Problem: Clinical Measurements: °Goal: Respiratory complications will improve °Outcome: Progressing °  °Problem: Clinical Measurements: °Goal: Cardiovascular complication will be avoided °Outcome: Progressing °  °Problem: Pain Managment: °Goal: General experience of comfort will improve °Outcome: Progressing °  °Problem: Safety: °Goal: Ability to remain free from injury will improve °Outcome: Progressing °  °

## 2021-10-14 ENCOUNTER — Inpatient Hospital Stay: Payer: Medicare PPO

## 2021-10-14 DIAGNOSIS — F0390 Unspecified dementia without behavioral disturbance: Secondary | ICD-10-CM | POA: Diagnosis not present

## 2021-10-14 DIAGNOSIS — I5032 Chronic diastolic (congestive) heart failure: Secondary | ICD-10-CM | POA: Diagnosis not present

## 2021-10-14 DIAGNOSIS — U071 COVID-19: Secondary | ICD-10-CM | POA: Diagnosis not present

## 2021-10-14 DIAGNOSIS — I2699 Other pulmonary embolism without acute cor pulmonale: Secondary | ICD-10-CM | POA: Diagnosis not present

## 2021-10-14 LAB — GLUCOSE, CAPILLARY
Glucose-Capillary: 140 mg/dL — ABNORMAL HIGH (ref 70–99)
Glucose-Capillary: 250 mg/dL — ABNORMAL HIGH (ref 70–99)
Glucose-Capillary: 406 mg/dL — ABNORMAL HIGH (ref 70–99)
Glucose-Capillary: 421 mg/dL — ABNORMAL HIGH (ref 70–99)
Glucose-Capillary: 395 mg/dL — ABNORMAL HIGH (ref 70–99)

## 2021-10-14 LAB — FERRITIN: Ferritin: 60 ng/mL (ref 11–307)

## 2021-10-14 LAB — C-REACTIVE PROTEIN: CRP: 2.5 mg/dL — ABNORMAL HIGH (ref <1.0)

## 2021-10-14 IMAGING — DX DG CHEST 1V PORT
1 series · 1 of 1 positions shown · non-contrast
Comparison: [DATE]

CLINICAL DATA: Recent [DF] infection, recent pulmonary embolus.
Recent urinary tract infection.

EXAM:
PORTABLE CHEST 1 VIEW

[chest ap]
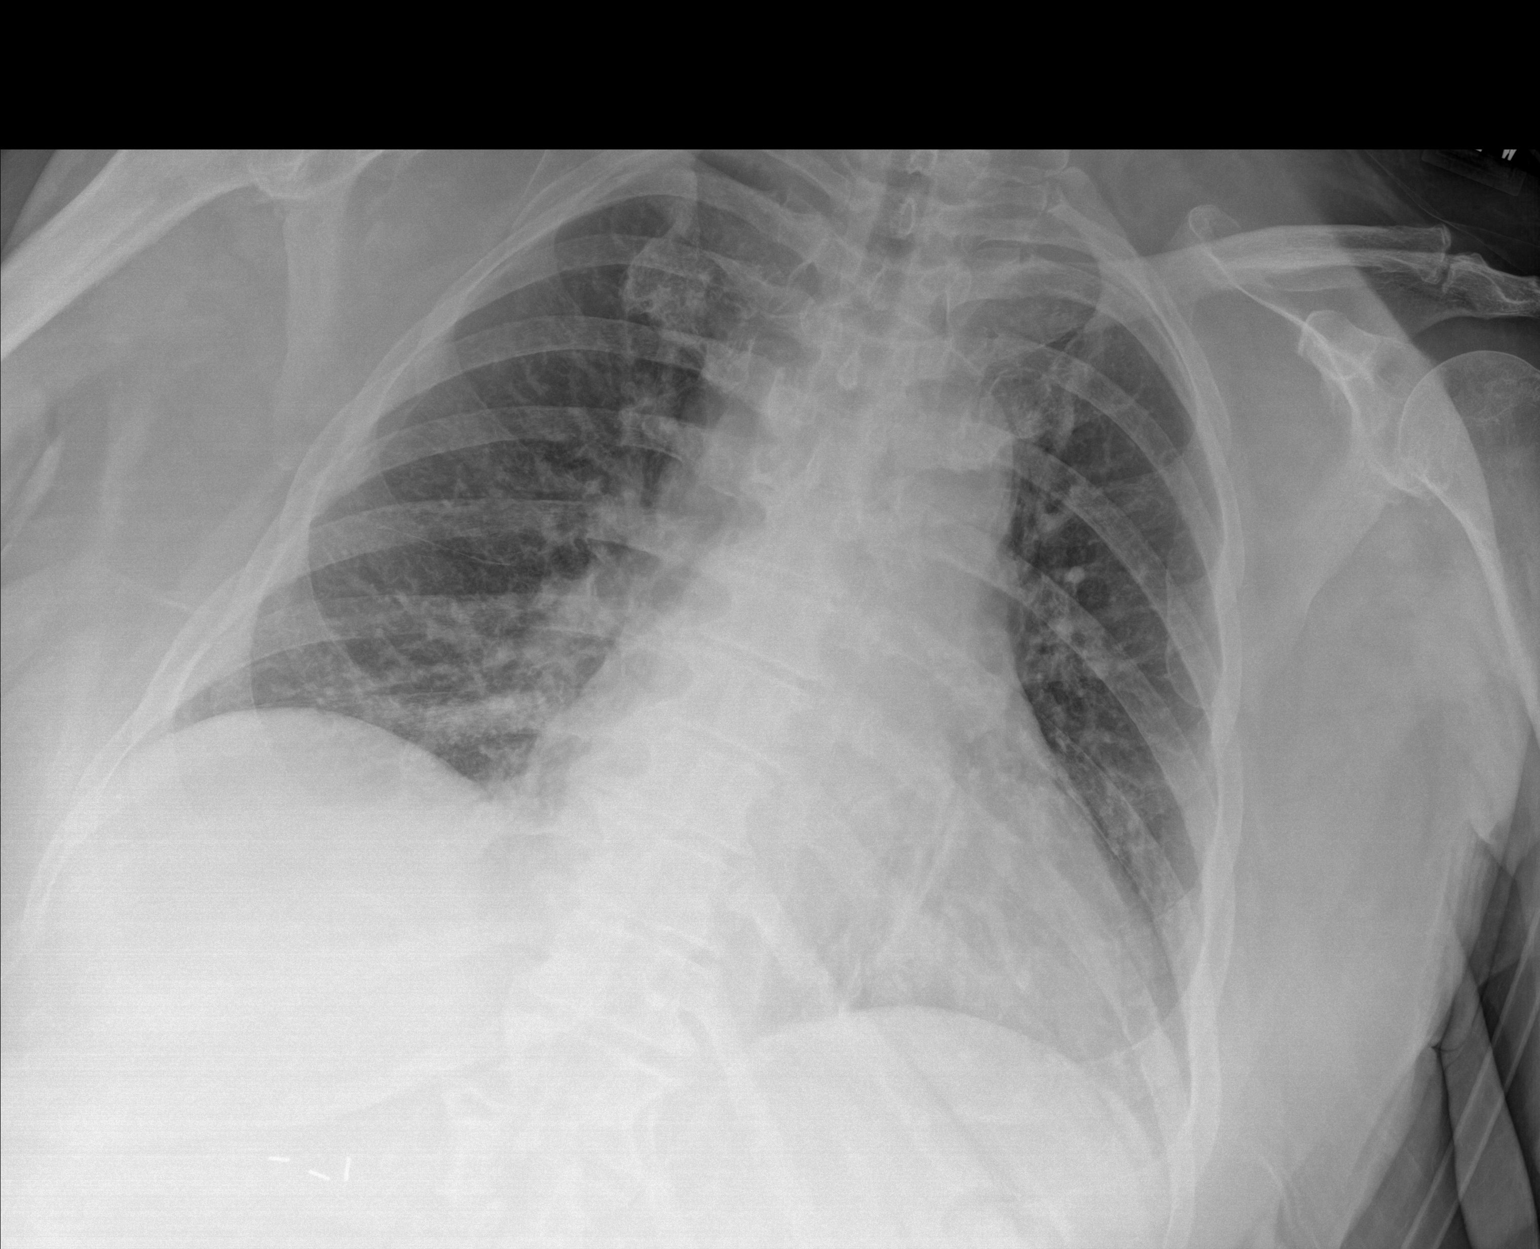

[1 of 1 positions shown; findings below may reference images not displayed]

FINDINGS: Mild enlargement of the cardiopericardial silhouette, without
findings of pulmonary edema. No specific conventional radiographic
manifestations of pneumonia. No blunting of the costophrenic angles.
Stable left lower lobe scarring versus subsegmental atelectasis.

Thoracic spondylosis.
IMPRESSION: 1. Mild enlargement of the cardiopericardial silhouette, without
current findings of pulmonary edema.
2. Stable left lower lobe scarring versus subsegmental atelectasis.

## 2021-10-14 MED ORDER — INSULIN GLARGINE-YFGN 100 UNIT/ML ~~LOC~~ SOLN
18.0000 [IU] | Freq: Every day | SUBCUTANEOUS | Status: DC
Start: 2021-10-14 — End: 2021-10-18
  Administered 2021-10-14 – 2021-10-17 (×4): 18 [IU] via SUBCUTANEOUS
  Filled 2021-10-14 (×5): qty 0.18

## 2021-10-14 MED ORDER — INSULIN ASPART 100 UNIT/ML IJ SOLN
5.0000 [IU] | Freq: Three times a day (TID) | INTRAMUSCULAR | Status: DC
  Administered 2021-10-14 – 2021-10-21 (×20): 5 [IU] via SUBCUTANEOUS
  Filled 2021-10-14 (×17): qty 1

## 2021-10-14 MED ORDER — INSULIN ASPART 100 UNIT/ML IJ SOLN: 5.0000 [IU] | Freq: Three times a day (TID) | INTRAMUSCULAR | Status: DC

## 2021-10-14 NOTE — Progress Notes (Signed)
°  °  Durable Medical Equipment  (From admission, onward)           Start     Ordered   10/14/21 1347  For home use only DME lightweight manual wheelchair with seat cushion  Once       Comments: Patient suffers from dementia which impairs their ability to perform daily activities like dressing in the home.  A walker will not resolve  issue with performing activities of daily living. A wheelchair will allow patient to safely perform daily activities. Patient is not able to propel themselves in the home using a standard weight wheelchair due to general weakness. Patient can self propel in the lightweight wheelchair. Length of need Lifetime. Accessories: elevating leg rests (ELRs), wheel locks, extensions and anti-tippers.   10/14/21 1347   10/14/21 1330  For home use only DME wheelchair cushion (seat and back)  Once        10/14/21 1329   10/12/21 1347  For home use only DME Hospital bed  Once       Question Answer Comment  Length of Need Lifetime   The above medical condition requires: Patient requires the ability to reposition frequently   Head must be elevated greater than: 30 degrees   Bed type Semi-electric   Hoyer Lift Yes   Trapeze Bar Yes   Support Surface: Alternating Pressure Pad and Pump      10/12/21 1347

## 2021-10-14 NOTE — Progress Notes (Signed)
Message from Wilhoit (sister) [phone] requesting a call back. Called [name] back and left a message.

## 2021-10-14 NOTE — Progress Notes (Signed)
°  Progress Note   Patient: Judith Shaw KAJ:[id number] DOB: 11-21-1950 DOA: 09/23/2021     20 DOS: the patient was seen and examined on 10/14/2021   Assessment and Plan: * COVID-19 virus infection Continue IV Solu-Medrol.  Continue is coughing less today.  Chest x-ray does not show pneumonia.  Completed 5 days of remdesivir.  Bilateral pulmonary embolism ([place])- (present on admission) On Eliquis.  Dementia without behavioral disturbance ([place])- (present on admission) The patient was able to have a good conversation with me and was more interactive today than yesterday.  Chronic diastolic CHF (congestive heart failure) ([place])- (present on admission) No signs of heart failure currently.  Last EF 55 to 60%.  On low-dose Lasix.  Malnutrition of moderate degree Dietitian following.  Continue to encourage eating.  Pressure injury of skin Stage II coccyx, present on admission see full description below.  Diabetes ([place]) Patient on low-dose glargine insulin and sliding scale.  This morning sugar at 140 and this afternoon sugar 250.  Continue to monitor closely.  UTI due to Klebsiella species Treated with 1 dose of fosfomycin.  Obesity (BMI 30-39.9) BMI 38.31 calculated by me.      Subjective: Patient still has a cough.  Still not feeling great but coughing less than yesterday.  Asking to get in a chair.  Physical Exam: Vitals:   10/13/21 1200 10/13/21 1557 10/13/21 2256 10/14/21 0642  BP:  (!) 152/59 118/71 120/61  Pulse:  (!) 55 (!) 51 (!) 53  Resp:  18 14 14   Temp:  (!) 96.7 F (35.9 C) 97.6 F (36.4 C) 97.6 F (36.4 C)  TempSrc:  Axillary  Oral  SpO2: 94% 94% 98% 92%  Weight:      Height:       Physical Exam HENT:     Head: Normocephalic.     Mouth/Throat:     Pharynx: No oropharyngeal exudate.  Eyes:     General: Lids are normal.     Conjunctiva/sclera: Conjunctivae normal.  Cardiovascular:     Rate and Rhythm: Normal rate and regular rhythm.     Heart sounds:  Normal heart sounds, S1 normal and S2 normal.  Pulmonary:     Breath sounds: Examination of the right-lower field reveals decreased breath sounds. Examination of the left-lower field reveals decreased breath sounds. Decreased breath sounds present. No wheezing, rhonchi or rales.  Abdominal:     Palpations: Abdomen is soft.     Tenderness: There is no abdominal tenderness.  Musculoskeletal:     Right lower leg: No swelling.     Left lower leg: No swelling.  Skin:    General: Skin is warm.     Findings: No rash.  Neurological:     Mental Status: She is alert.     Comments: Patient asking intelligent questions.     Data Reviewed: Chest x-ray reviewed, CRP has come down from 9.9 down to 2.5.  Family Communication: Spoke with patient's sister on the phone  Disposition: Status is: Inpatient Remains inpatient appropriate because: Awaiting COVID isolation to end prior to going out to rehab  Planned Discharge Destination: Skilled nursing facility  Author: Loletha Grayer, MD 10/14/2021 2:40 PM  For on call review [email or web address].

## 2021-10-15 DIAGNOSIS — I5032 Chronic diastolic (congestive) heart failure: Secondary | ICD-10-CM | POA: Diagnosis not present

## 2021-10-15 DIAGNOSIS — U071 COVID-19: Secondary | ICD-10-CM | POA: Diagnosis not present

## 2021-10-15 DIAGNOSIS — F0390 Unspecified dementia without behavioral disturbance: Secondary | ICD-10-CM | POA: Diagnosis not present

## 2021-10-15 DIAGNOSIS — I2699 Other pulmonary embolism without acute cor pulmonale: Secondary | ICD-10-CM | POA: Diagnosis not present

## 2021-10-15 LAB — CULTURE, BLOOD (ROUTINE X 2): Culture: NO GROWTH

## 2021-10-15 LAB — GLUCOSE, CAPILLARY
Glucose-Capillary: 380 mg/dL — ABNORMAL HIGH (ref 70–99)
Glucose-Capillary: 265 mg/dL — ABNORMAL HIGH (ref 70–99)
Glucose-Capillary: 228 mg/dL — ABNORMAL HIGH (ref 70–99)
Glucose-Capillary: 268 mg/dL — ABNORMAL HIGH (ref 70–99)

## 2021-10-15 MED ORDER — PREDNISONE 20 MG PO TABS
30.0000 mg | ORAL_TABLET | Freq: Every day | ORAL | Status: DC
  Administered 2021-10-15 – 2021-10-16 (×2): 30 mg via ORAL
  Filled 2021-10-15 (×2): qty 1

## 2021-10-15 NOTE — Progress Notes (Signed)
°  PPD negative CXR interpreted by me, no sign of tuberculosis  Dr Leslye Peer

## 2021-10-15 NOTE — TOC Progression Note (Signed)
Transition of Care [facility]) - Progression Note    Patient Details  Name: Judith Shaw MRN: [id number] Date of Birth: 1951/05/18  Transition of Care [facility]) CM/SW [place], LCSW Phone Number: 10/15/2021, 10:46 AM  Clinical Narrative:    Confirmed with Thedore Mins and Danielle with [employer] that they have everything they need for patient to get hospital bed and wheelchair.  Faxed FL2 and TB results to [facility] with [facility]. Per [facility] Supervisor, Andee Poles was going to set up transportation to facility Monday. Asked Danielle to let TOC know if she has any barriers with setting this up.     Expected Discharge Plan: [place] Barriers to Discharge: Continued Medical Work up  Expected Discharge Plan and Services Expected Discharge Plan: [place] Choice: [place] (LTAC) Living arrangements for the past 2 months: [place]                                       Social Determinants of Health (SDOH) Interventions    Readmission Risk Interventions Readmission Risk Prevention Plan 08/12/2021  Transportation Screening Complete  PCP or Specialist Appt within 3-5 Days Complete  HRI or [place] Complete  Social Work Consult for [place] Planning/Counseling Complete  Palliative Care Screening Not Applicable  Medication Review Press photographer) Complete  Some recent data might be hidden

## 2021-10-15 NOTE — Progress Notes (Signed)
Progress Note   Patient: Judith Shaw [id number]:[id number] DOB: 1951-02-08 DOA: 09/23/2021     21 DOS: the patient was seen and examined on 10/15/2021     Assessment and Plan: * COVID-19 virus infection Switch to po prednisone.  Chest x-ray does not show pneumonia.  Completed 5 days of remdesivir.  Bilateral pulmonary embolism ([place])- (present on admission) On Eliquis.  Dementia without behavioral disturbance ([place])- (present on admission) The patient asked when she is going home.  I told her that she was going to another location to get stronger.  She seemed more distressed about this and was trying to convince me that she can go home.  I told her that she will go out to the new facility on Monday.  I was able to change the subject.  Chronic diastolic CHF (congestive heart failure) ([place])- (present on admission) No signs of heart failure currently.  Last EF 55 to 60%.  On low-dose Lasix.  Malnutrition of moderate degree Dietitian following.  Continue to encourage eating.  Pressure injury of skin Stage II coccyx, present on admission see full description below.  Diabetes ([place]) Patient on glargine insulin and sliding scale.  Sugars elevated with being on IV Solu-Medrol. Switch to p.o. prednisone today.  Hopefully will taper quickly.  UTI due to Klebsiella species Treated with 1 dose of fosfomycin.  Obesity (BMI 30-39.9) BMI 38.31 calculated by me.        Subjective: Patient has been to go home today.  I told her she was going to a facility on Monday to help her get stronger.  She was trying to convince me that she can go home.  She needed a Hoyer lift to get into the chair.  She complained of some leg pain with being in the chair and we had to use some pillows to elevate her leg.  Physical Exam: Vitals:   10/14/21 1714 10/14/21 2116 10/15/21 0625 10/15/21 0827  BP: 140/75 (!) 179/73 (!) 141/64 (!) 154/67  Pulse: 69 68 64 66  Resp: 18 18 16 20   Temp: 97.8 F (36.6 C) 97.9 F (36.6  C) 97.9 F (36.6 C) 97.7 F (36.5 C)  TempSrc:  Oral Oral Oral  SpO2: 91% 93% 94% 94%  Weight:   118.8 kg   Height:       Physical Exam HENT:     Head: Normocephalic.     Mouth/Throat:     Pharynx: No oropharyngeal exudate.  Eyes:     General: Lids are normal.     Conjunctiva/sclera: Conjunctivae normal.  Cardiovascular:     Rate and Rhythm: Normal rate and regular rhythm.     Heart sounds: Normal heart sounds, S1 normal and S2 normal.  Pulmonary:     Breath sounds: No decreased breath sounds, wheezing, rhonchi or rales.  Abdominal:     Palpations: Abdomen is soft.     Tenderness: There is no abdominal tenderness.  Musculoskeletal:     Right lower leg: Swelling present.     Left lower leg: Swelling present.  Skin:    General: Skin is warm.     Findings: No rash.  Neurological:     Mental Status: She is alert.     Comments: Answers questions appropriately.     Data Reviewed: PPD negative read by me this morning  Family Communication: Spoke with patient's sister on the phone at length.  Disposition: Status is: Inpatient Remains inpatient appropriate because: Will go out to facility on Monday  Planned  Discharge Destination: Rehab at assisted living.  Author: Loletha Grayer, MD 10/15/2021 2:27 PM  For on call review [email or web address].

## 2021-10-16 DIAGNOSIS — I5032 Chronic diastolic (congestive) heart failure: Secondary | ICD-10-CM | POA: Diagnosis not present

## 2021-10-16 DIAGNOSIS — I2699 Other pulmonary embolism without acute cor pulmonale: Secondary | ICD-10-CM | POA: Diagnosis not present

## 2021-10-16 DIAGNOSIS — F0390 Unspecified dementia without behavioral disturbance: Secondary | ICD-10-CM | POA: Diagnosis not present

## 2021-10-16 DIAGNOSIS — U071 COVID-19: Secondary | ICD-10-CM | POA: Diagnosis not present

## 2021-10-16 LAB — CBC
HCT: 46.8 % — ABNORMAL HIGH (ref 36.0–46.0)
Hemoglobin: 15.6 g/dL — ABNORMAL HIGH (ref 12.0–15.0)
MCH: 28.7 pg (ref 26.0–34.0)
MCHC: 33.3 g/dL (ref 30.0–36.0)
MCV: 86.2 fL (ref 80.0–100.0)
Platelets: 282 10*3/uL (ref 150–400)
RBC: 5.43 MIL/uL — ABNORMAL HIGH (ref 3.87–5.11)
RDW: 14 % (ref 11.5–15.5)
WBC: 8.4 10*3/uL (ref 4.0–10.5)
nRBC: 0 % (ref 0.0–0.2)

## 2021-10-16 LAB — BASIC METABOLIC PANEL
Anion gap: 9 (ref 5–15)
BUN: 36 mg/dL — ABNORMAL HIGH (ref 8–23)
CO2: 27 mmol/L (ref 22–32)
Calcium: 9.3 mg/dL (ref 8.9–10.3)
Chloride: 101 mmol/L (ref 98–111)
Creatinine, Ser: 0.98 mg/dL (ref 0.44–1.00)
GFR, Estimated: >60 mL/min (ref >60)
Glucose, Bld: 251 mg/dL — ABNORMAL HIGH (ref 70–99)
Potassium: 4.6 mmol/L (ref 3.5–5.1)
Sodium: 137 mmol/L (ref 135–145)

## 2021-10-16 LAB — GLUCOSE, CAPILLARY
Glucose-Capillary: 217 mg/dL — ABNORMAL HIGH (ref 70–99)
Glucose-Capillary: 210 mg/dL — ABNORMAL HIGH (ref 70–99)
Glucose-Capillary: 216 mg/dL — ABNORMAL HIGH (ref 70–99)
Glucose-Capillary: 279 mg/dL — ABNORMAL HIGH (ref 70–99)

## 2021-10-16 LAB — C-REACTIVE PROTEIN: CRP: 0.7 mg/dL (ref <1.0)

## 2021-10-16 MED ORDER — PREDNISONE 20 MG PO TABS
20.0000 mg | ORAL_TABLET | Freq: Every day | ORAL | Status: DC
  Administered 2021-10-17 – 2021-10-18 (×2): 20 mg via ORAL
  Filled 2021-10-16 (×2): qty 1

## 2021-10-16 NOTE — Progress Notes (Signed)
Progress Note   Patient: Judith Shaw [id number] DOB: 12-29-1950 DOA: 09/23/2021     22 DOS: the patient was seen and examined on 10/16/2021     Assessment and Plan: * COVID-19 virus infection Received 30 mg of prednisone today, will decrease down to 20 mg tomorrow and continue to taper.  Chest x-ray does not show pneumonia.  Completed 5 days of remdesivir.  Bilateral pulmonary embolism ([place])- (present on admission) On Eliquis.  Dementia without behavioral disturbance ([place])- (present on admission) We were able to have a good conversation today.  She is acceptable to going to a place in [place] for help to take care of her.  Chronic diastolic CHF (congestive heart failure) ([place])- (present on admission) No signs of heart failure currently.  Last EF 55 to 60%.  On low-dose Lasix.  Malnutrition of moderate degree Dietitian following.  Continue to encourage eating.  Pressure injury of skin Stage II coccyx, present on admission see full description below.  Diabetes ([place]) Patient on glargine insulin and sliding scale.  Sugars elevated with steroids.  Hopefully will be able to taper off shortly.  UTI due to Klebsiella species Treated with 1 dose of fosfomycin.  Obesity (BMI 30-39.9) Not sure if this most recent weight is accurate.  Asking nursing staff to get another weight.  Sometimes it can be difficult with these bed weights.        Subjective: The patient was agreeable to go out to facility in [place], for them to help take care of her.  The patient did bring up that she is unable to give money to her foster child at this point.  I advised her that her money is going to be needed to help take care of her at this point.  Physical Exam: Vitals:   10/15/21 1636 10/15/21 2039 10/16/21 0529 10/16/21 0815  BP: (!) 142/72 (!) 153/85 (!) 154/76 102/73  Pulse: 72 64 (!) 58 63  Resp: 19 20 20 16   Temp: 97.7 F (36.5 C) 97.8 F (36.6 C) 97.8 F (36.6 C) 97.9 F (36.6 C)   TempSrc:  Oral  Oral  SpO2: 96% 96% 93% 95%  Weight:      Height:       Physical Exam HENT:     Head: Normocephalic.     Mouth/Throat:     Pharynx: No oropharyngeal exudate.  Eyes:     General: Lids are normal.     Conjunctiva/sclera: Conjunctivae normal.  Cardiovascular:     Rate and Rhythm: Normal rate and regular rhythm.     Heart sounds: Normal heart sounds, S1 normal and S2 normal.  Pulmonary:     Breath sounds: No decreased breath sounds, wheezing, rhonchi or rales.  Abdominal:     Palpations: Abdomen is soft.     Tenderness: There is no abdominal tenderness.  Musculoskeletal:     Right lower leg: Swelling present.     Left lower leg: Swelling present.  Skin:    General: Skin is warm.     Findings: No rash.  Neurological:     Mental Status: She is alert.     Comments: Answers questions appropriately.     Data Reviewed: Hemoglobin 15.6, a.m. sugar 251, creatinine 0.98, CRP 0.7  Family Communication: Updated patient's sister on the phone  Disposition: Status is: Inpatient Remains inpatient appropriate because: Awaiting isolation and to go out to facility  Planned Discharge Destination: Assisted living facility  Author: Loletha Grayer, MD 10/16/2021 11:17 AM  For  on call review [email or web address].

## 2021-10-16 NOTE — TOC Progression Note (Addendum)
Transition of Care [facility]) - Progression Note    Patient Details  Name: Judith Shaw MRN: [id number] Date of Birth: July 04, 1951  Transition of Care [facility]) CM/SW [place], LCSW Phone Number: 10/16/2021, 12:34 PM  Clinical Narrative:   Plan to DC Monday. CSW reached out to [facility] with [facility] to inquire if there are any additional needs prior to DC tomorrow. Waiting on response.   1:33- Per Andee Poles with [facility], everything is in place for patient to DC tomorrow. Family has arranged transport to facility. [employer] has already coordinated with facility for DME delivery.   Expected Discharge Plan: [place] Barriers to Discharge: Continued Medical Work up  Expected Discharge Plan and Services Expected Discharge Plan: [place] Choice: [place] (LTAC) Living arrangements for the past 2 months: [place]                                       Social Determinants of Health (SDOH) Interventions    Readmission Risk Interventions Readmission Risk Prevention Plan 08/12/2021  Transportation Screening Complete  PCP or Specialist Appt within 3-5 Days Complete  HRI or [place] Complete  Social Work Consult for [place] Planning/Counseling Complete  Palliative Care Screening Not Applicable  Medication Review Press photographer) Complete  Some recent data might be hidden

## 2021-10-17 DIAGNOSIS — U071 COVID-19: Secondary | ICD-10-CM | POA: Diagnosis not present

## 2021-10-17 DIAGNOSIS — F0390 Unspecified dementia without behavioral disturbance: Secondary | ICD-10-CM | POA: Diagnosis not present

## 2021-10-17 DIAGNOSIS — I2699 Other pulmonary embolism without acute cor pulmonale: Secondary | ICD-10-CM | POA: Diagnosis not present

## 2021-10-17 DIAGNOSIS — L853 Xerosis cutis: Secondary | ICD-10-CM

## 2021-10-17 DIAGNOSIS — I5032 Chronic diastolic (congestive) heart failure: Secondary | ICD-10-CM | POA: Diagnosis not present

## 2021-10-17 LAB — GLUCOSE, CAPILLARY
Glucose-Capillary: 138 mg/dL — ABNORMAL HIGH (ref 70–99)
Glucose-Capillary: 219 mg/dL — ABNORMAL HIGH (ref 70–99)
Glucose-Capillary: 263 mg/dL — ABNORMAL HIGH (ref 70–99)
Glucose-Capillary: 246 mg/dL — ABNORMAL HIGH (ref 70–99)

## 2021-10-17 LAB — RESP PANEL BY RT-PCR (FLU A&B, COVID) ARPGX2
Influenza A by PCR: NEGATIVE
Influenza B by PCR: NEGATIVE
SARS Coronavirus 2 by RT PCR: POSITIVE — AB

## 2021-10-17 MED ORDER — ACETAMINOPHEN 325 MG PO TABS: 650.0000 mg | ORAL_TABLET | Freq: Four times a day (QID) | ORAL | Status: AC | PRN

## 2021-10-17 MED ORDER — GABAPENTIN 300 MG PO CAPS: 300.0000 mg | ORAL_CAPSULE | Freq: Every day | ORAL | 0 refills | Status: AC

## 2021-10-17 MED ORDER — BENZONATATE 100 MG PO CAPS: 100.0000 mg | ORAL_CAPSULE | Freq: Three times a day (TID) | ORAL | 0 refills | Status: AC | PRN

## 2021-10-17 MED ORDER — PANTOPRAZOLE SODIUM 40 MG PO TBEC: 40.0000 mg | DELAYED_RELEASE_TABLET | Freq: Every day | ORAL | 0 refills | Status: AC

## 2021-10-17 MED ORDER — ENSURE MAX PROTEIN PO LIQD: 11.0000 [oz_av] | Freq: Every day | ORAL | 0 refills | Status: AC

## 2021-10-17 MED ORDER — AMMONIUM LACTATE 12 % EX LOTN: TOPICAL_LOTION | CUTANEOUS | 0 refills | Status: AC | PRN

## 2021-10-17 MED ORDER — ZINC SULFATE 220 (50 ZN) MG PO CAPS: 220.0000 mg | ORAL_CAPSULE | Freq: Every day | ORAL | 0 refills | Status: AC

## 2021-10-17 MED ORDER — ADULT MULTIVITAMIN W/MINERALS CH: 1.0000 | ORAL_TABLET | Freq: Every day | ORAL | 0 refills | Status: AC

## 2021-10-17 MED ORDER — GUAIFENESIN-DM 100-10 MG/5ML PO SYRP: 5.0000 mL | ORAL_SOLUTION | ORAL | 0 refills | Status: AC | PRN

## 2021-10-17 MED ORDER — GLUCERNA SHAKE PO LIQD: 237.0000 mL | Freq: Two times a day (BID) | ORAL | 0 refills | Status: AC

## 2021-10-17 MED ORDER — ASCORBIC ACID 500 MG PO TABS: 500.0000 mg | ORAL_TABLET | Freq: Every day | ORAL | 0 refills | Status: AC

## 2021-10-17 MED ORDER — ARIPIPRAZOLE 5 MG PO TABS: 5.0000 mg | ORAL_TABLET | Freq: Every day | ORAL | 0 refills | Status: AC

## 2021-10-17 MED ORDER — APIXABAN 5 MG PO TABS: 5.0000 mg | ORAL_TABLET | Freq: Two times a day (BID) | ORAL | 0 refills | Status: AC

## 2021-10-17 MED ORDER — AMMONIUM LACTATE 12 % EX LOTN
TOPICAL_LOTION | CUTANEOUS | Status: DC | PRN
  Filled 2021-10-17: qty 400

## 2021-10-17 MED ORDER — BUSPIRONE HCL 10 MG PO TABS
10.0000 mg | ORAL_TABLET | Freq: Three times a day (TID) | ORAL | 0 refills | Status: AC
Start: 2021-10-17 — End: ?

## 2021-10-17 MED ORDER — INSULIN GLARGINE 100 UNIT/ML SOLOSTAR PEN: 15.0000 [IU] | PEN_INJECTOR | Freq: Every day | SUBCUTANEOUS | 0 refills | Status: AC

## 2021-10-17 MED ORDER — FLUTICASONE PROPIONATE 50 MCG/ACT NA SUSP: 2.0000 | Freq: Every day | NASAL | 0 refills | Status: AC

## 2021-10-17 MED ORDER — INSULIN ASPART FLEXPEN 100 UNIT/ML ~~LOC~~ SOPN: 5.0000 [IU] | PEN_INJECTOR | Freq: Three times a day (TID) | SUBCUTANEOUS | 0 refills | Status: AC

## 2021-10-17 MED ORDER — QUETIAPINE FUMARATE 25 MG PO TABS: 75.0000 mg | ORAL_TABLET | Freq: Every day | ORAL | 0 refills | Status: AC

## 2021-10-17 MED ORDER — DONEPEZIL HCL 10 MG PO TABS: 10.0000 mg | ORAL_TABLET | Freq: Every day | ORAL | 0 refills | Status: AC

## 2021-10-17 MED ORDER — FUROSEMIDE 20 MG PO TABS: 20.0000 mg | ORAL_TABLET | Freq: Every day | ORAL | 0 refills | Status: AC

## 2021-10-17 MED ORDER — PREDNISONE 10 MG PO TABS: ORAL_TABLET | ORAL | 0 refills | Status: AC

## 2021-10-17 MED ORDER — LORAZEPAM 0.5 MG PO TABS: 0.5000 mg | ORAL_TABLET | Freq: Three times a day (TID) | ORAL | 0 refills | Status: AC | PRN

## 2021-10-17 NOTE — Progress Notes (Addendum)
Progress Note   Patient: Judith Shaw [id number] DOB: [birth year] DOA: 09/23/2021     23 DOS: the patient was seen and examined on 10/17/2021   Brief hospital course: 71 year old female with past medical history of type 2 diabetes mellitus with hyperlipidemia, hypertension, chronic diastolic congestive heart failure, history of uterine cancer, sleep apnea anxiety, dementia.  Patient initially admitted with lethargy and loss of consciousness.  MRI of the brain did not show any acute changes.  The patient had an elevated D-dimer and CT angiogram showed bilateral pulmonary embolism.  Initially was on heparin drip and then she is currently on Eliquis since 09/25/2021.  She tested positive for COVID on 10/10/2021.  The patient completed 5 days of remdesivir.  Steroids needed to be started for bronchospasm and continuous coughing and they are being tapered at this point.  The patient was supposed to go out to assisted living after 7 days of isolation but her CT value was still under 30 at 28.9 on 10/17/2021.  Patient will remain in the hospital currently  Assessment and Plan: * COVID-19 virus infection Patient on 20 mg of prednisone today.  Will hopefully taper quickly.  Chest x-ray does not show pneumonia.  Completed 5 days of remdesivir.  Bilateral pulmonary embolism ([place])- (present on admission) On Eliquis.  Dementia without behavioral disturbance ([place])- (present on admission) Patient was a little more tired this morning than usual.  But did answer questions appropriately.  Chronic diastolic CHF (congestive heart failure) ([place])- (present on admission) No signs of heart failure currently.  Last EF 55 to 60%.  On low-dose Lasix.  Malnutrition of moderate degree Dietitian following.  Continue to encourage eating.  Pressure injury of skin Stage II coccyx, present on admission see full description below.  Diabetes ([place]) Patient on glargine insulin and sliding scale.  Sugars elevated with  steroids.  Tapering steroids.  UTI due to Klebsiella species Treated with 1 dose of fosfomycin.  Dry skin Apply Lac-Hydrin lotion to feet bilaterally.  Obesity (BMI 30-39.9) Most recent BMI 36.6.        Subjective: Patient answered some simple yes/no questions appropriately.  Offers no complaints.  Still in the hospital secondary to COVID-19 infection.  Physical Exam: Vitals:   10/16/21 1953 10/17/21 0636 10/17/21 0829 10/17/21 1111  BP: (!) 150/109 (!) 141/108 111/60 (!) 106/55  Pulse: 65 (!) 102 (!) 50 (!) 54  Resp: 18  14   Temp: 98.1 F (36.7 C) 97.7 F (36.5 C) 98 F (36.7 C)   TempSrc: Oral Oral Oral   SpO2: 94% 96% 100%   Weight:      Height:       Physical Exam HENT:     Head: Normocephalic.     Mouth/Throat:     Pharynx: No oropharyngeal exudate.  Eyes:     General: Lids are normal.     Conjunctiva/sclera: Conjunctivae normal.  Cardiovascular:     Rate and Rhythm: Normal rate and regular rhythm.     Heart sounds: Normal heart sounds, S1 normal and S2 normal.  Pulmonary:     Breath sounds: No decreased breath sounds, wheezing, rhonchi or rales.  Abdominal:     Palpations: Abdomen is soft.     Tenderness: There is no abdominal tenderness.  Musculoskeletal:     Right lower leg: Swelling present.     Left lower leg: Swelling present.  Skin:    General: Skin is warm.     Findings: No rash.  Neurological:  Mental Status: She is alert.     Comments: Answers questions appropriately.     Data Reviewed: COVID CT value upon diagnosis of COVID was 15.4.  CT value today 28.9.  Family Communication: Updated patient's sister on the phone  Disposition: Status is: Inpatient Remains inpatient appropriate because: The assisted living facility will not take her with a CT value less than 30.  We will have transitional care team to see if they will take after 10 days of isolation  Planned Discharge Destination: Assisted living facility with home  health.  Author: Loletha Grayer, MD 10/17/2021 3:22 PM  For on call review [email or web address].

## 2021-10-17 NOTE — Hospital Course (Addendum)
71 year old female with past medical history of type 2 diabetes mellitus with hyperlipidemia, hypertension, chronic diastolic congestive heart failure, history of uterine cancer, sleep apnea anxiety, dementia.  Patient initially admitted with lethargy and loss of consciousness.  MRI of the brain did not show any acute changes.  The patient had an elevated D-dimer and CT angiogram showed bilateral pulmonary embolism.  Initially was on heparin drip and then she is currently on Eliquis since 09/25/2021.  She tested positive for COVID on 10/10/2021.  The patient completed 5 days of remdesivir.  Steroids needed to be started for bronchospasm and continuous coughing and they are being tapered at this point.  The patient was supposed to go out to assisted living after 7 days of isolation but her CT value was still under 30 at 28.9 on 10/17/2021.  Patient will remain in the hospital currently.  Hopefully can go after 10 days of isolation here.

## 2021-10-17 NOTE — Care Management Important Message (Signed)
Important Message  Patient Details  Name: Judith Shaw MRN: [id number] Date of Birth: October 18, 1950   Medicare Important Message Given:  Yes  I reviewed the Important Message from Medicare with with POA, sister, [name] by phone [phone] and she was waiting to hear from RN CM on CT levels before discharge to take place. Confirmed with RN CM that she would call with results.  I thanked her for her time.   [name] A [name] 10/17/2021, 2:30 PM

## 2021-10-17 NOTE — Assessment & Plan Note (Signed)
Apply Lac-Hydrin lotion to feet bilaterally.

## 2021-10-17 NOTE — TOC Progression Note (Addendum)
Transition of Care [facility]) - Progression Note    Patient Details  Name: ANIAYAH ALANIZ MRN: [id number] Date of Birth: 07-01-1951  Transition of Care [facility]) CM/SW [place], RN Phone Number: 10/17/2021, 1:40 PM  Clinical Narrative:   Facility requesting Ct level for COVID testing. Facility requesting Ct level be above 30 prior to transport.   Test results 28.9-updated Danielle @ [facility]. Will arrange transport via [place] once able to confirm transfer plan.   Updated [name], sister-POA of Ct results and delay in transfer.    Expected Discharge Plan: [place] Barriers to Discharge: Continued Medical Work up  Expected Discharge Plan and Services Expected Discharge Plan: [place] Choice: [place] (LTAC) Living arrangements for the past 2 months: [place]                                       Social Determinants of Health (SDOH) Interventions    Readmission Risk Interventions Readmission Risk Prevention Plan 08/12/2021  Transportation Screening Complete  PCP or Specialist Appt within 3-5 Days Complete  HRI or [place] Complete  Social Work Consult for [place] Planning/Counseling Complete  Palliative Care Screening Not Applicable  Medication Review Press photographer) Complete  Some recent data might be hidden

## 2021-10-18 DIAGNOSIS — I5032 Chronic diastolic (congestive) heart failure: Secondary | ICD-10-CM | POA: Diagnosis not present

## 2021-10-18 DIAGNOSIS — F0390 Unspecified dementia without behavioral disturbance: Secondary | ICD-10-CM | POA: Diagnosis not present

## 2021-10-18 DIAGNOSIS — U071 COVID-19: Secondary | ICD-10-CM | POA: Diagnosis not present

## 2021-10-18 DIAGNOSIS — I2699 Other pulmonary embolism without acute cor pulmonale: Secondary | ICD-10-CM | POA: Diagnosis not present

## 2021-10-18 LAB — GLUCOSE, CAPILLARY
Glucose-Capillary: 161 mg/dL — ABNORMAL HIGH (ref 70–99)
Glucose-Capillary: 161 mg/dL — ABNORMAL HIGH (ref 70–99)
Glucose-Capillary: 338 mg/dL — ABNORMAL HIGH (ref 70–99)
Glucose-Capillary: 383 mg/dL — ABNORMAL HIGH (ref 70–99)

## 2021-10-18 MED ORDER — PREDNISONE 10 MG PO TABS
15.0000 mg | ORAL_TABLET | Freq: Every day | ORAL | Status: DC
Start: 2021-10-19 — End: 2021-10-21
  Administered 2021-10-19 – 2021-10-21 (×3): 15 mg via ORAL
  Filled 2021-10-18 (×3): qty 2

## 2021-10-18 MED ORDER — INSULIN GLARGINE-YFGN 100 UNIT/ML ~~LOC~~ SOLN
15.0000 [IU] | Freq: Every day | SUBCUTANEOUS | Status: DC
  Administered 2021-10-18 – 2021-10-20 (×3): 15 [IU] via SUBCUTANEOUS
  Filled 2021-10-18 (×4): qty 0.15

## 2021-10-18 NOTE — TOC Progression Note (Signed)
Transition of Care [facility]) - Progression Note    Patient Details  Name: Judith Shaw MRN: [id number] Date of Birth: [birth year]  Transition of Care [facility]) CM/SW Contact  Anselm Pancoast, RN Phone Number: 10/18/2021, 10:07 AM  Clinical Narrative:    Confirmed DME has arrived to ALF and patient can discharge once Ct levels are 30 or above. Will recheck on Wednesday.    Expected Discharge Plan: [place] Barriers to Discharge: Continued Medical Work up  Expected Discharge Plan and Services Expected Discharge Plan: [place] Choice: [place] (LTAC) Living arrangements for the past 2 months: [place]                                       Social Determinants of Health (SDOH) Interventions    Readmission Risk Interventions Readmission Risk Prevention Plan 08/12/2021  Transportation Screening Complete  PCP or Specialist Appt within 3-5 Days Complete  HRI or [place] Complete  Social Work Consult for [place] Planning/Counseling Complete  Palliative Care Screening Not Applicable  Medication Review Press photographer) Complete  Some recent data might be hidden

## 2021-10-18 NOTE — Progress Notes (Signed)
Progress Note   Patient: Judith Shaw DOB: 1951/06/06 DOA: 09/23/2021     24 DOS: the patient was seen and examined on 10/18/2021   Brief hospital course: 71 year old female with past medical history of type 2 diabetes mellitus with hyperlipidemia, hypertension, chronic diastolic congestive heart failure, history of uterine cancer, sleep apnea anxiety, dementia.  Patient initially admitted with lethargy and loss of consciousness.  MRI of the brain did not show any acute changes.  The patient had an elevated D-dimer and CT angiogram showed bilateral pulmonary embolism.  Initially was on heparin drip and then she is currently on Eliquis since 09/25/2021.  She tested positive for COVID on 10/10/2021.  The patient completed 5 days of remdesivir.  Steroids needed to be started for bronchospasm and continuous coughing and they are being tapered at this point.  The patient was supposed to go out to assisted living after 7 days of isolation but her CT value was still under 30 at 28.9 on 10/17/2021.  Patient will remain in the hospital currently.  Hopefully can go after 10 days of isolation here.  Assessment and Plan: * COVID-19 virus infection Decreased to 15 mg of prednisone for tomorrow.  Will hopefully taper quickly.  Chest x-ray does not show pneumonia.  Completed 5 days of remdesivir.  Bilateral pulmonary embolism ([place])- (present on admission) On Eliquis.  Dementia without behavioral disturbance ([place])- (present on admission) Mental status was good today when I saw her.  Answered all questions appropriately.  Chronic diastolic CHF (congestive heart failure) ([place])- (present on admission) No signs of heart failure currently.  Last EF 55 to 60%.  On low-dose Lasix.  Malnutrition of moderate degree Dietitian following.  Continue to encourage eating.  Pressure injury of skin Stage II coccyx, present on admission see full description below.  Diabetes ([place]) Patient on glargine  insulin and sliding scale.  Sugars elevated with steroids.  Tapering steroids.  UTI due to Klebsiella species Treated with 1 dose of fosfomycin.  Dry skin Apply Lac-Hydrin lotion to feet bilaterally.  Obesity (BMI 30-39.9) Most recent BMI 36.6.        Subjective: Patient answered all questions appropriately today.  Cough is improved.  Initially admitted 25 days ago with lethargy and loss of consciousness.  She has been in isolation for COVID while here in the hospital.  Physical Exam: Vitals:   10/17/21 2126 10/18/21 0651 10/18/21 0751 10/18/21 1208  BP: (!) 159/69 (!) 115/59 (!) 107/54 (!) 166/136  Pulse: 75 64 (!) 58 (!) 59  Resp: 16 16 17 18   Temp: 97.6 F (36.4 C) 97.6 F (36.4 C) (!) 97.1 F (36.2 C) (!) 97.3 F (36.3 C)  TempSrc: Oral     SpO2: 93% 94% 96% 100%  Weight:      Height:       Physical Exam HENT:     Head: Normocephalic.     Mouth/Throat:     Pharynx: No oropharyngeal exudate.  Eyes:     General: Lids are normal.     Conjunctiva/sclera: Conjunctivae normal.  Cardiovascular:     Rate and Rhythm: Normal rate and regular rhythm.     Heart sounds: Normal heart sounds, S1 normal and S2 normal.  Pulmonary:     Breath sounds: No decreased breath sounds, wheezing, rhonchi or rales.  Abdominal:     Palpations: Abdomen is soft.     Tenderness: There is no abdominal tenderness.  Musculoskeletal:     Right lower leg: Swelling present.  Left lower leg: Swelling present.  Skin:    General: Skin is warm.     Findings: No rash.     Comments: Scaling bilateral feet and dry skin.  Neurological:     Mental Status: She is alert.     Comments: Answers questions appropriately.  Able to straight leg raise.     Data Reviewed:   Family Communication: Spoke with sister on the phone  Disposition: Status is: Inpatient Remains inpatient appropriate because: COVID CT still below 30 at 28.9.  Planned Discharge Destination: Assisted  living   Author: Loletha Grayer, MD 10/18/2021 2:44 PM  For on call review [email or web address].

## 2021-10-19 DIAGNOSIS — U071 COVID-19: Secondary | ICD-10-CM | POA: Diagnosis not present

## 2021-10-19 DIAGNOSIS — F0390 Unspecified dementia without behavioral disturbance: Secondary | ICD-10-CM | POA: Diagnosis not present

## 2021-10-19 DIAGNOSIS — E669 Obesity, unspecified: Secondary | ICD-10-CM | POA: Diagnosis not present

## 2021-10-19 LAB — CBC
HCT: 47.7 % — ABNORMAL HIGH (ref 36.0–46.0)
Hemoglobin: 15.4 g/dL — ABNORMAL HIGH (ref 12.0–15.0)
MCH: 28.2 pg (ref 26.0–34.0)
MCHC: 32.3 g/dL (ref 30.0–36.0)
MCV: 87.4 fL (ref 80.0–100.0)
Platelets: 298 10*3/uL (ref 150–400)
RBC: 5.46 MIL/uL — ABNORMAL HIGH (ref 3.87–5.11)
RDW: 14.1 % (ref 11.5–15.5)
WBC: 11 10*3/uL — ABNORMAL HIGH (ref 4.0–10.5)
nRBC: 0 % (ref 0.0–0.2)

## 2021-10-19 LAB — GLUCOSE, CAPILLARY
Glucose-Capillary: 157 mg/dL — ABNORMAL HIGH (ref 70–99)
Glucose-Capillary: 231 mg/dL — ABNORMAL HIGH (ref 70–99)
Glucose-Capillary: 295 mg/dL — ABNORMAL HIGH (ref 70–99)
Glucose-Capillary: 241 mg/dL — ABNORMAL HIGH (ref 70–99)

## 2021-10-19 LAB — RESP PANEL BY RT-PCR (FLU A&B, COVID) ARPGX2
Influenza A by PCR: NEGATIVE
Influenza B by PCR: NEGATIVE
SARS Coronavirus 2 by RT PCR: POSITIVE — AB

## 2021-10-19 LAB — OCCULT BLOOD X 1 CARD TO LAB, STOOL: Fecal Occult Bld: POSITIVE — AB

## 2021-10-19 MED ORDER — POLYETHYLENE GLYCOL 3350 17 G PO PACK
17.0000 g | PACK | Freq: Every day | ORAL | Status: DC
  Administered 2021-10-20: 17 g via ORAL
  Filled 2021-10-19: qty 1

## 2021-10-19 MED ORDER — DOCUSATE SODIUM 100 MG PO CAPS
200.0000 mg | ORAL_CAPSULE | Freq: Two times a day (BID) | ORAL | Status: DC
Start: 2021-10-19 — End: 2021-10-21
  Administered 2021-10-19 – 2021-10-20 (×3): 200 mg via ORAL
  Filled 2021-10-19 (×3): qty 2

## 2021-10-19 NOTE — Progress Notes (Signed)
Nutrition Follow-up  DOCUMENTATION CODES:   Non-severe (moderate) malnutrition in context of chronic illness, Obesity unspecified  INTERVENTION:   -Continue with carb modified diet -Continue 500 mg vitamin C BID -Continue 220 mg zinc sulfate daily x 14 days -Continue feeding assistance with meals -D/c Ensure Max po daily, each supplement provides 150 kcal and 30 grams of protein -Continue MVI with minerals daily -Continue Sugar free Mighty Shake TID with meals, each supplement provides 220 kcals and 6 grams protein -Continue Glucerna Shake po BID, each supplement provides 220 kcal and 10 grams of protein   NUTRITION DIAGNOSIS:   Moderate Malnutrition related to chronic illness (dementia) as evidenced by mild fat depletion, mild muscle depletion, percent weight loss.  Ongoing  GOAL:   Patient will meet greater than or equal to 90% of their needs  Progressing   MONITOR:   PO intake, Supplement acceptance, Labs, Weight trends, Skin, I & O's  REASON FOR ASSESSMENT:   Low [place]    ASSESSMENT:   Judith Shaw is a 71 y.o. female with history of diabetes mellitus type 2, hyperlipidemia, depression, sleep apnea noncompliant with CPAP prior history of hysterectomy for uterine CA was brought to the ER from skilled nursing facility after patient had a brief episode of loss of consciousness.  As per the patient's sister patient has become more lethargic over the last 5 days.  Patient has been eating less.  Has been more bedbound and physical therapy has been trying to make her ambulate but finds it difficult.  2 days ago with some of her depression medications were changed her BuSpar dose was increased.  Patient has become more lethargic.  This morning patient was attempted to stand up when patient lost consciousness briefly and fell onto back to her bed.  Since then patient has become more lethargic.  No seizure-like activity was noted.  Patient has not complained of any chest pain  shortness of breath nausea vomiting or diarrhea.  Reviewed I/O's: -450 ml x 24 hours and -4.2 L since 10/05/21  UOP: 450 ml x 24 hours   Pt unavailable at time of visit. Attempted to speak with pt via call to hospital room phone, however, unable to reach.  Per MD notes, pt's mentation has improved. Noted meal completions 0-100%, averaging 50-100%. Pt is taking Glucerna supplements, but not very accepting of Ensure Max supplements.   Per MD notes, fecal occult stool test ordered secondary to red tinged stool last night.   Pt awaiting transfer to SNF after COVID isolation.   Medications reviewed and include lasix, prednisone  Labs reviewed: CBGS: 157-383 (inpatient orders for glycemic control are 0-5 units insulin aspart daily at bedtime, 0-9 units insulin aspart TID with meals, 5 units insulin aspart TID with meals, and 15 units insulin glargine-yfgn daily at bedtime).    Diet Order:   Diet Order             Diet Carb Modified Fluid consistency: Thin; Room service appropriate? Yes with Assist  Diet effective now                   EDUCATION NEEDS:   Education needs have been addressed  Skin:  Skin Assessment: Skin Integrity Issues: Skin Integrity Issues:: Stage II Stage II: coccyx  Last BM:  10/19/21  Height:   Ht Readings from Last 1 Encounters:  09/27/21 5\' 3"  (1.6 m)    Weight:   Wt Readings from Last 1 Encounters:  10/16/21 93.7 kg  Ideal Body Weight:  52.3 kg  BMI:  Body mass index is 36.6 kg/m.  Estimated Nutritional Needs:   Kcal:  1850-2050  Protein:  115-130 grams  Fluid:  > 1.8 L    Loistine Chance, RD, LDN, [place] Registered Dietitian II Certified Diabetes Care and Education Specialist Please refer to [facility] for RD and/or RD on-call/weekend/after hours pager

## 2021-10-19 NOTE — Progress Notes (Signed)
CT level reported by lab as 35.4. MD and case management updated.

## 2021-10-19 NOTE — Progress Notes (Signed)
° °      CROSS COVER NOTE  NAME: Judith Shaw MRN: [id number] DOB : May 04, 1951   Secure chat received from Myna Hidalgo RN  "112 had a large BM that had a red-tinge to it. He described it as type-4 and stated the red was not very dark but also not bright red"  Vital signs are stable. Fecal Occult Stool test ordered and CBC ordered.   Neomia Glass MHA, MSN, FNP-BC Nurse Practitioner [facility] Pager [phone]

## 2021-10-19 NOTE — Progress Notes (Signed)
PROGRESS NOTE    Judith Shaw  ZOX:[id number] DOB: Mar 10, 1951 DOA: 09/23/2021 PCP: Pcp, No   Assessment & Plan:   Principal Problem:   COVID-19 virus infection Active Problems:   UTI due to Klebsiella species   Diabetes ([place])   Chronic diastolic CHF (congestive heart failure) (HCC)   Benign essential HTN   Dementia without behavioral disturbance (HCC)   Syncope   Lethargy   Acute encephalopathy   Pressure injury of skin   Bilateral pulmonary embolism (HCC)   Malnutrition of moderate degree   Obesity (BMI 30-39.9)   Dry skin   COVID-19 virus infection: completed remdesivir course. Continue on steroid taper. Continue on airborne & contact precautions    Bilateral pulmonary embolism : continue on eliquis    Dementia: continue w/ supportive care    Chronic diastolic CHF: continue on lasix. Monitor I/Os. Appears compensated   Malnutrition of moderate degree: continue on nutritional supplements    Stage II coccyx ulcer: present on admission. Continue w/ wound care    DM2: likely poorly controlled. Continue on glargine, SSI w/ accuchecks   UTI: secondary to klebsiella species. S/p fosfomycin x 1   Dry skin: continue lac-hydrin lotion b/l foot    Obesity: BMI 36.6. Complicates overall care & prognosis    DVT prophylaxis: eliquis  Code Status: full  Family Communication: discussed pt's care w/ pt's sister, [name], and answered her questions  Disposition Plan: d/c to SNF  Level of care: Med-Surg  Status is: Inpatient Remains inpatient appropriate because: waiting to complete quarantine vs Ct level are 30 or above    Consultants:    Procedures:   Antimicrobials:   Subjective: Pt c/o fatigue   Objective: Vitals:   10/18/21 1958 10/18/21 1959 10/18/21 2020 10/19/21 0422  BP: (!) 118/19 (!) 118/19 (!) 156/72 120/61  Pulse: 65 64  69  Resp: 20 20  16   Temp: 98.2 F (36.8 C) 98.2 F (36.8 C)  98.1 F (36.7 C)  TempSrc: Oral Oral  Oral  SpO2: 95% 97%  97%   Weight:      Height:        Intake/Output Summary (Last 24 hours) at 10/19/2021 0809 Last data filed at 10/18/2021 1415 Gross per 24 hour  Intake --  Output 450 ml  Net -450 ml   Filed Weights   09/27/21 1102 10/15/21 0625 10/16/21 1700  Weight: 98.1 kg 118.8 kg 93.7 kg    Examination:  General exam: Appears calm and comfortable  Respiratory system: Clear to auscultation. Respiratory effort normal. Cardiovascular system: S1 & S2+. No  rubs, gallops or clicks.  Gastrointestinal system: Abdomen is obese, soft and nontender.  Hypoactive bowel sounds heard. Central nervous system: Alert and oriented. Moves all extremities  Psychiatry: Judgement and insight appear normal. Flat mood and affect    Data Reviewed: I have personally reviewed following labs and imaging studies  CBC: Recent Labs  Lab 10/16/21 0549 10/19/21 0542  WBC 8.4 11.0*  HGB 15.6* 15.4*  HCT 46.8* 47.7*  MCV 86.2 87.4  PLT 282 811   Basic Metabolic Panel: Recent Labs  Lab 10/16/21 0549  NA 137  K 4.6  CL 101  CO2 27  GLUCOSE 251*  BUN 36*  CREATININE 0.98  CALCIUM 9.3   GFR: Estimated Creatinine Clearance: 57.3 mL/min (by C-G formula based on SCr of 0.98 mg/dL). Liver Function Tests: No results for input(s): AST, ALT, ALKPHOS, BILITOT, PROT, ALBUMIN in the last 168 hours. No results for input(s):  LIPASE, AMYLASE in the last 168 hours. No results for input(s): AMMONIA in the last 168 hours. Coagulation Profile: No results for input(s): INR, PROTIME in the last 168 hours. Cardiac Enzymes: No results for input(s): CKTOTAL, CKMB, CKMBINDEX, TROPONINI in the last 168 hours. BNP (last 3 results) No results for input(s): PROBNP in the last 8760 hours. HbA1C: No results for input(s): HGBA1C in the last 72 hours. CBG: Recent Labs  Lab 10/17/21 2129 10/18/21 0848 10/18/21 1210 10/18/21 1608 10/18/21 1955  GLUCAP 246* 161* 161* 338* 383*   Lipid Profile: No results for input(s): CHOL, HDL,  LDLCALC, TRIG, CHOLHDL, LDLDIRECT in the last 72 hours. Thyroid Function Tests: No results for input(s): TSH, T4TOTAL, FREET4, T3FREE, THYROIDAB in the last 72 hours. Anemia Panel: No results for input(s): VITAMINB12, FOLATE, FERRITIN, TIBC, IRON, RETICCTPCT in the last 72 hours. Sepsis Labs: No results for input(s): PROCALCITON, LATICACIDVEN in the last 168 hours.  Recent Results (from the past 240 hour(s))  CULTURE, BLOOD (ROUTINE X 2) w Reflex to ID Panel     Status: Abnormal   Collection Time: 10/10/21  9:19 AM   Specimen: BLOOD  Result Value Ref Range Status   Specimen Description   Final    BLOOD RA Performed at [facility], [address] [place], [place] [address]    Special Requests   Final    BOTTLES DRAWN AEROBIC AND ANAEROBIC Blood Culture adequate volume Performed at [facility], [address] [place], [place] [address]    Culture  Setup Time   Final    GRAM POSITIVE COCCI ANAEROBIC BOTTLE ONLY CRITICAL RESULT CALLED TO, READ BACK BY AND VERIFIED WITH: Corene Cornea Robbins@[email or web address]  10/11/21 RH    Culture (A)  Final    STAPHYLOCOCCUS EPIDERMIDIS THE SIGNIFICANCE OF ISOLATING THIS ORGANISM FROM A SINGLE SET OF BLOOD CULTURES WHEN MULTIPLE SETS ARE DRAWN IS UNCERTAIN. PLEASE NOTIFY THE MICROBIOLOGY DEPARTMENT WITHIN ONE WEEK IF SPECIATION AND SENSITIVITIES ARE REQUIRED. Performed at [facility], [place] [address] [place], [place] [address]    Report Status 10/13/2021 FINAL  Final  CULTURE, BLOOD (ROUTINE X 2) w Reflex to ID Panel     Status: None   Collection Time: 10/10/21  9:19 AM   Specimen: BLOOD  Result Value Ref Range Status   Specimen Description BLOOD RIGHT ANTECUBITAL  Final   Special Requests   Final    BOTTLES DRAWN AEROBIC AND ANAEROBIC Blood Culture results may not be optimal due to an inadequate volume of blood received in culture bottles   Culture   Final    NO GROWTH 5 DAYS Performed at [facility], [address]  [place], [place] [address]    Report Status 10/15/2021 FINAL  Final  Blood Culture ID Panel (Reflexed)     Status: Abnormal   Collection Time: 10/10/21  9:19 AM  Result Value Ref Range Status   Enterococcus faecalis NOT DETECTED NOT DETECTED Final   Enterococcus Faecium NOT DETECTED NOT DETECTED Final   Listeria monocytogenes NOT DETECTED NOT DETECTED Final   Staphylococcus species DETECTED (A) NOT DETECTED Final    Comment: CRITICAL RESULT CALLED TO, READ BACK BY AND VERIFIED WITH: JASON ROBBINS@0406  10/11/21 RH    Staphylococcus aureus (BCID) NOT DETECTED NOT DETECTED Final   Staphylococcus epidermidis DETECTED (A) NOT DETECTED Final    Comment: Methicillin (oxacillin) resistant coagulase negative staphylococcus. Possible blood culture contaminant (unless isolated from more than one blood culture draw or clinical case suggests pathogenicity). No  antibiotic treatment is indicated for blood  culture contaminants. JASON ROBBINS@0406  10/11/21 RH    Staphylococcus lugdunensis NOT DETECTED NOT DETECTED Final   Streptococcus species NOT DETECTED NOT DETECTED Final   Streptococcus agalactiae NOT DETECTED NOT DETECTED Final   Streptococcus pneumoniae NOT DETECTED NOT DETECTED Final   Streptococcus pyogenes NOT DETECTED NOT DETECTED Final   A.calcoaceticus-baumannii NOT DETECTED NOT DETECTED Final   Bacteroides fragilis NOT DETECTED NOT DETECTED Final   Enterobacterales NOT DETECTED NOT DETECTED Final   Enterobacter cloacae complex NOT DETECTED NOT DETECTED Final   Escherichia coli NOT DETECTED NOT DETECTED Final   Klebsiella aerogenes NOT DETECTED NOT DETECTED Final   Klebsiella oxytoca NOT DETECTED NOT DETECTED Final   Klebsiella pneumoniae NOT DETECTED NOT DETECTED Final   Proteus species NOT DETECTED NOT DETECTED Final   Salmonella species NOT DETECTED NOT DETECTED Final   Serratia marcescens NOT DETECTED NOT DETECTED Final   Haemophilus influenzae NOT DETECTED NOT DETECTED Final   Neisseria  meningitidis NOT DETECTED NOT DETECTED Final   Pseudomonas aeruginosa NOT DETECTED NOT DETECTED Final   Stenotrophomonas maltophilia NOT DETECTED NOT DETECTED Final   Candida albicans NOT DETECTED NOT DETECTED Final   Candida auris NOT DETECTED NOT DETECTED Final   Candida glabrata NOT DETECTED NOT DETECTED Final   Candida krusei NOT DETECTED NOT DETECTED Final   Candida parapsilosis NOT DETECTED NOT DETECTED Final   Candida tropicalis NOT DETECTED NOT DETECTED Final   Cryptococcus neoformans/gattii NOT DETECTED NOT DETECTED Final   Methicillin resistance mecA/C DETECTED (A) NOT DETECTED Final    Comment: JASON ROBBINS@0406  10/11/21 RH Performed at [facility], [address] [place], [place] [address]   Resp Panel by RT-PCR (Flu A&B, Covid) Nasopharyngeal Swab     Status: Abnormal   Collection Time: 10/10/21 12:30 PM   Specimen: Nasopharyngeal Swab; Nasopharyngeal(NP) swabs in vial transport medium  Result Value Ref Range Status   SARS Coronavirus 2 by RT PCR POSITIVE (A) NEGATIVE Final    Comment: (NOTE) SARS-CoV-2 target nucleic acids are DETECTED.  The SARS-CoV-2 RNA is generally detectable in upper respiratory specimens during the acute phase of infection. Positive results are indicative of the presence of the identified virus, but do not rule out bacterial infection or co-infection with other pathogens not detected by the test. Clinical correlation with patient history and other diagnostic information is necessary to determine patient infection status. The expected result is Negative.  Fact Sheet for Patients: EntrepreneurPulse.com.au  Fact Sheet for Healthcare Providers: IncredibleEmployment.be  This test is not yet approved or cleared by the [place] FDA and  has been authorized for detection and/or diagnosis of SARS-CoV-2 by FDA under an Emergency Use Authorization (EUA).  This EUA will remain in effect (meaning this  test can be used) for the duration of  the COVID-19 declaration under Section 564(b)(1) of the A ct, 21 U.S.C. section 360bbb-3(b)(1), unless the authorization is terminated or revoked sooner.     Influenza A by PCR NEGATIVE NEGATIVE Final   Influenza B by PCR NEGATIVE NEGATIVE Final    Comment: (NOTE) The Xpert Xpress SARS-CoV-2/FLU/RSV plus assay is intended as an aid in the diagnosis of influenza from Nasopharyngeal swab specimens and should not be used as a sole basis for treatment. Nasal washings and aspirates are unacceptable for Xpert Xpress SARS-CoV-2/FLU/RSV testing.  Fact Sheet for Patients: EntrepreneurPulse.com.au  Fact Sheet for Healthcare Providers: IncredibleEmployment.be  This test is not yet approved or cleared by the [place] FDA and has  been authorized for detection and/or diagnosis of SARS-CoV-2 by FDA under an Emergency Use Authorization (EUA). This EUA will remain in effect (meaning this test can be used) for the duration of the COVID-19 declaration under Section 564(b)(1) of the Act, 21 U.S.C. section 360bbb-3(b)(1), unless the authorization is terminated or revoked.  Performed at [facility], [address] [place], [place] [address]   Resp Panel by RT-PCR (Flu A&B, Covid) Nasopharyngeal Swab     Status: Abnormal   Collection Time: 10/17/21 11:05 AM   Specimen: Nasopharyngeal Swab; Nasopharyngeal(NP) swabs in vial transport medium  Result Value Ref Range Status   SARS Coronavirus 2 by RT PCR POSITIVE (A) NEGATIVE Final    Comment: (NOTE) SARS-CoV-2 target nucleic acids are DETECTED.  The SARS-CoV-2 RNA is generally detectable in upper respiratory specimens during the acute phase of infection. Positive results are indicative of the presence of the identified virus, but do not rule out bacterial infection or co-infection with other pathogens not detected by the test. Clinical correlation with patient  history and other diagnostic information is necessary to determine patient infection status. The expected result is Negative.  Fact Sheet for Patients: EntrepreneurPulse.com.au  Fact Sheet for Healthcare Providers: IncredibleEmployment.be  This test is not yet approved or cleared by the [place] FDA and  has been authorized for detection and/or diagnosis of SARS-CoV-2 by FDA under an Emergency Use Authorization (EUA).  This EUA will remain in effect (meaning this test can be used) for the duration of  the COVID-19 declaration under Section 564(b)(1) of the A ct, 21 U.S.C. section 360bbb-3(b)(1), unless the authorization is terminated or revoked sooner.     Influenza A by PCR NEGATIVE NEGATIVE Final   Influenza B by PCR NEGATIVE NEGATIVE Final    Comment: (NOTE) The Xpert Xpress SARS-CoV-2/FLU/RSV plus assay is intended as an aid in the diagnosis of influenza from Nasopharyngeal swab specimens and should not be used as a sole basis for treatment. Nasal washings and aspirates are unacceptable for Xpert Xpress SARS-CoV-2/FLU/RSV testing.  Fact Sheet for Patients: EntrepreneurPulse.com.au  Fact Sheet for Healthcare Providers: IncredibleEmployment.be  This test is not yet approved or cleared by the [place] FDA and has been authorized for detection and/or diagnosis of SARS-CoV-2 by FDA under an Emergency Use Authorization (EUA). This EUA will remain in effect (meaning this test can be used) for the duration of the COVID-19 declaration under Section 564(b)(1) of the Act, 21 U.S.C. section 360bbb-3(b)(1), unless the authorization is terminated or revoked.  Performed at [facility], [address], [place], [place] [address]          Radiology Studies: No results found.      Scheduled Meds:  apixaban  5 mg Oral BID   ARIPiprazole  5 mg Oral Daily   vitamin C  500 mg Oral BID    benzonatate  100 mg Oral TID   busPIRone  10 mg Oral TID   donepezil  10 mg Oral QHS   feeding supplement (GLUCERNA SHAKE)  237 mL Oral BID BM   fluticasone  2 spray Each Nare Daily   furosemide  20 mg Oral Daily   gabapentin  300 mg Oral QHS   insulin aspart  0-5 Units Subcutaneous QHS   insulin aspart  0-9 Units Subcutaneous TID WC   insulin aspart  5 Units Subcutaneous TID WC   insulin glargine-yfgn  15 Units Subcutaneous QHS   Ipratropium-Albuterol  2 puff Inhalation QID   multivitamin with  minerals  1 tablet Oral Daily   pantoprazole  40 mg Oral Daily   predniSONE  15 mg Oral Daily   Ensure Max Protein  11 oz Oral QHS   QUEtiapine  75 mg Oral QHS   zinc sulfate  220 mg Oral Daily   Continuous Infusions:   LOS: 25 days    Time spent: 33 mins     Wyvonnia Dusky, MD [facility] Pager [phone]  If 7PM-7AM, please contact night-coverage 10/19/2021, 8:09 AM

## 2021-10-19 NOTE — TOC Progression Note (Signed)
Transition of Care [facility]) - Progression Note    Patient Details  Name: Judith Shaw MRN: [id number] Date of Birth: 1950/11/17  Transition of Care [facility]) CM/SW [place], RN Phone Number: 10/19/2021, 4:19 PM  Clinical Narrative:    Ct level at recommended level. Updated Danielle at [employer]. Will plan for discharge Thursday to ALF.    Expected Discharge Plan: [place] Barriers to Discharge: Continued Medical Work up  Expected Discharge Plan and Services Expected Discharge Plan: [place] Choice: [place] (LTAC) Living arrangements for the past 2 months: [place]                                       Social Determinants of Health (SDOH) Interventions    Readmission Risk Interventions Readmission Risk Prevention Plan 08/12/2021  Transportation Screening Complete  PCP or Specialist Appt within 3-5 Days Complete  HRI or [place] Complete  Social Work Consult for [place] Planning/Counseling Complete  Palliative Care Screening Not Applicable  Medication Review Press photographer) Complete  Some recent data might be hidden

## 2021-10-20 DIAGNOSIS — U071 COVID-19: Secondary | ICD-10-CM | POA: Diagnosis not present

## 2021-10-20 DIAGNOSIS — E669 Obesity, unspecified: Secondary | ICD-10-CM | POA: Diagnosis not present

## 2021-10-20 DIAGNOSIS — I2699 Other pulmonary embolism without acute cor pulmonale: Secondary | ICD-10-CM | POA: Diagnosis not present

## 2021-10-20 LAB — BASIC METABOLIC PANEL
Anion gap: 7 (ref 5–15)
BUN: 50 mg/dL — ABNORMAL HIGH (ref 8–23)
CO2: 26 mmol/L (ref 22–32)
Calcium: 8.9 mg/dL (ref 8.9–10.3)
Chloride: 103 mmol/L (ref 98–111)
Creatinine, Ser: 1.1 mg/dL — ABNORMAL HIGH (ref 0.44–1.00)
GFR, Estimated: 54 mL/min — ABNORMAL LOW (ref >60)
Glucose, Bld: 231 mg/dL — ABNORMAL HIGH (ref 70–99)
Potassium: 4.2 mmol/L (ref 3.5–5.1)
Sodium: 136 mmol/L (ref 135–145)

## 2021-10-20 LAB — CBC
HCT: 44.3 % (ref 36.0–46.0)
Hemoglobin: 14.3 g/dL (ref 12.0–15.0)
MCH: 28.3 pg (ref 26.0–34.0)
MCHC: 32.3 g/dL (ref 30.0–36.0)
MCV: 87.7 fL (ref 80.0–100.0)
Platelets: 253 10*3/uL (ref 150–400)
RBC: 5.05 MIL/uL (ref 3.87–5.11)
RDW: 14.3 % (ref 11.5–15.5)
WBC: 9.9 10*3/uL (ref 4.0–10.5)
nRBC: 0 % (ref 0.0–0.2)

## 2021-10-20 LAB — GLUCOSE, CAPILLARY
Glucose-Capillary: 201 mg/dL — ABNORMAL HIGH (ref 70–99)
Glucose-Capillary: 292 mg/dL — ABNORMAL HIGH (ref 70–99)
Glucose-Capillary: 325 mg/dL — ABNORMAL HIGH (ref 70–99)
Glucose-Capillary: 305 mg/dL — ABNORMAL HIGH (ref 70–99)

## 2021-10-20 NOTE — Plan of Care (Signed)

## 2021-10-20 NOTE — TOC Progression Note (Addendum)
Transition of Care [facility]) - Progression Note    Patient Details  Name: Judith Shaw MRN: [id number] Date of Birth: 1951/04/14  Transition of Care [facility]) CM/SW [place], RN Phone Number: 10/20/2021, 9:00 AM  Clinical Narrative:    Spoke with Danielle @ [facility] patient is clear to discharge today to [facility]. TOC will arrange transportation. Report can be called to [facility] @ [phone].  Attempts to schedule transport:Unable to accomodate tomorrow either.  [facility]-Declined [facility]-Declined [facility]ined [facility]ined [place]-Declined [facility]-Declined  Updated Danielle @ [facility] of delays in transport. Researching possible wheelchair transport.   Outreached to [employer] [facility] unable to accomodate anything in [place].   1414: Spoke to Andee Poles who is working on possible Teacher, early years/pre.   Expected Discharge Plan: [employer] Barriers to Discharge: Continued Medical Work up  Expected Discharge Plan and Services Expected Discharge Plan: [place] Choice: [place] (LTAC) Living arrangements for the past 2 months: [place]                                       Social Determinants of Health (SDOH) Interventions    Readmission Risk Interventions Readmission Risk Prevention Plan 08/12/2021  Transportation Screening Complete  PCP or Specialist Appt within 3-5 Days Complete  HRI or [place] Complete  Social Work Consult for [place] Planning/Counseling Complete  Palliative Care Screening Not Applicable  Medication Review Press photographer) Complete  Some recent data might be hidden

## 2021-10-20 NOTE — TOC Transition Note (Signed)
Transition of Care [facility]) - CM/SW Discharge Note   Patient Details  Name: MEILANI EDMUNDSON MRN: [id number] Date of Birth: Jul 21, 1951  [facility]) CM/SW Contact:  Anselm Pancoast, RN Phone Number: 10/20/2021, 3:25 PM   Clinical Narrative:    Confirmed with Danielle @ [facility]-Yolanda with [facility] Constant will be picking patient up @ 11am Friday in [place]. Facility has Fl2 for medications and all DME is in building. Updated treatment `team.      Barriers to Discharge: Continued Medical Work up   Patient Goals and CMS Choice        Discharge Placement                       Discharge Plan and Services     Post Acute Care Choice: Long Term Acute Care (LTAC)                               Social Determinants of Health (SDOH) Interventions     Readmission Risk Interventions Readmission Risk Prevention Plan 08/12/2021  Transportation Screening Complete  PCP or Specialist Appt within 3-5 Days Complete  HRI or [place] Complete  Social Work Consult for [place] Planning/Counseling Complete  Palliative Care Screening Not Applicable  Medication Review Press photographer) Complete  Some recent data might be hidden

## 2021-10-20 NOTE — Discharge Summary (Addendum)
Physician Discharge Summary  DIAN LAPRADE IHK:[id number] DOB: [birth year] DOA: 09/23/2021  PCP: Pcp, No  Admit date: 09/23/2021 Discharge date: 10/21/21  Admitted From: home  Disposition:  [facility]  Recommendations for Outpatient Follow-up:  Follow up with PCP in 1-2 weeks  Home Health: Equipment/Devices:  Discharge Condition: stable CODE STATUS: full  Diet recommendation: Carb Modified   Brief/Interim Summary: HPI was taken from Dr. Hal Hope: Brooke Bonito is a 71 y.o. female with history of diabetes mellitus type 2, hyperlipidemia, depression, sleep apnea noncompliant with CPAP prior history of hysterectomy for uterine CA was brought to the ER from skilled nursing facility after patient had a brief episode of loss of consciousness.  As per the patient's sister patient has become more lethargic over the last 5 days.  Patient has been eating less.  Has been more bedbound and physical therapy has been trying to make her ambulate but finds it difficult.  2 days ago with some of her depression medications were changed her BuSpar dose was increased.  Patient has become more lethargic.  This morning patient was attempted to stand up when patient lost consciousness briefly and fell onto back to her bed.  Since then patient has become more lethargic.  No seizure-like activity was noted.  Patient has not complained of any chest pain shortness of breath nausea vomiting or diarrhea.   ED Course: In the ER patient was found to be very sleepy MRI brain did not show anything acute.  Patient is following commands moving all extremities when patient woke up.  Labs are unremarkable.  Patient is afebrile.  ABG does not show any carbon dioxide retention.  Lactic acid is normal I sensitive troponins are negative.  COVID test negative.  Patient admitted for further observation.   As per Dr. Leslye Peer: 71 year old female with past medical history of type 2 diabetes mellitus with hyperlipidemia, hypertension,  chronic diastolic congestive heart failure, history of uterine cancer, sleep apnea anxiety, dementia.   Patient initially admitted with lethargy and loss of consciousness.  MRI of the brain did not show any acute changes.   The patient had an elevated D-dimer and CT angiogram showed bilateral pulmonary embolism.  Initially was on heparin drip and then she is currently on Eliquis since 09/25/2021.   She tested positive for COVID on 10/10/2021.  The patient completed 5 days of remdesivir.  Steroids needed to be started for bronchospasm and continuous coughing and they are being tapered at this point.   The patient was supposed to go out to assisted living after 7 days of isolation but her CT value was still under 30 at 28.9 on 10/17/2021.  Patient will remain in the hospital currently.  Hopefully can go after 10 days of isolation here.   As per Dr. Jimmye Norman 2/22-2/23/23: Pt was medically stable but was waiting on COVID19 value to be above 30. Pt completed her quarantine on the day of d/c for COVID19. For more information please see previous progress/consult notes   Discharge Diagnoses:  Principal Problem:   COVID-19 virus infection Active Problems:   UTI due to Klebsiella species   Diabetes ([place])   Chronic diastolic CHF (congestive heart failure) (HCC)   Benign essential HTN   Dementia without behavioral disturbance (HCC)   Syncope   Lethargy   Acute encephalopathy   Pressure injury of skin   Bilateral pulmonary embolism (H[facility])   Malnutrition of moderate degree   Obesity (BMI 30-39.9)   Dry skin  COVID-19 virus infection:  completed remdesivir course. Continue on steroid taper   Bilateral pulmonary embolism : continue on eliquis    Dementia: continue w/ supportive care    Chronic diastolic CHF: continue on lasix. Monitor I/Os. Appears compensated    Malnutrition of moderate degree: continue on nutritional supplements    Stage II coccyx ulcer: present on admission. Continue w/ wound  care    DM2: likely poorly controlled. Continue on glargine, aspart   UTI: secondary to klebsiella species. S/p fosfomycin x 1   Dry skin: continue lac-hydrin lotion b/l foot    Obesity: BMI 36.6. Complicates overall care & prognosis    Discharge Instructions  Discharge Instructions     Diet Carb Modified   Complete by: As directed    Discharge instructions   Complete by: As directed    F/u w/ PCP in 1-2 weeks.   Increase activity slowly   Complete by: As directed    No wound care   Complete by: As directed       Allergies as of 10/20/2021       Reactions   Codeine         Medication List     STOP taking these medications    aluminum-magnesium hydroxide-simethicone 200-200-20 MG/5ML Susp Commonly known as: MAALOX   aspirin EC 81 MG tablet   DULoxetine 60 MG capsule Commonly known as: CYMBALTA   famotidine 20 MG tablet Commonly known as: PEPCID   gentian violet 2 % topical solution   simvastatin 40 MG tablet Commonly known as: ZOCOR   Tresiba FlexTouch 200 UNIT/ML FlexTouch Pen Generic drug: insulin degludec   Trulicity 4.5 [id number]/1.9JY Sopn Generic drug: Dulaglutide       TAKE these medications    acetaminophen 325 MG tablet Commonly known as: TYLENOL Take 2 tablets (650 mg total) by mouth every 6 (six) hours as needed. What changed: Another medication with the same name was added. Make sure you understand how and when to take each.   acetaminophen 325 MG tablet Commonly known as: TYLENOL Take 2 tablets (650 mg total) by mouth every 6 (six) hours as needed for mild pain (or Fever >/= 101). What changed: You were already taking a medication with the same name, and this prescription was added. Make sure you understand how and when to take each.   ammonium lactate 12 % lotion Commonly known as: LAC-HYDRIN Apply topically as needed for dry skin.   apixaban 5 MG Tabs tablet Commonly known as: ELIQUIS Take 1 tablet (5 mg total) by mouth 2 (two)  times daily.   ARIPiprazole 5 MG tablet Commonly known as: ABILIFY Take 1 tablet (5 mg total) by mouth daily. What changed: Another medication with the same name was removed. Continue taking this medication, and follow the directions you see here.   ascorbic acid 500 MG tablet Commonly known as: VITAMIN C Take 1 tablet (500 mg total) by mouth daily.   benzonatate 100 MG capsule Commonly known as: TESSALON Take 1 capsule (100 mg total) by mouth 3 (three) times daily as needed for cough.   busPIRone 10 MG tablet Commonly known as: BUSPAR Take 1 tablet (10 mg total) by mouth 3 (three) times daily. What changed: Another medication with the same name was removed. Continue taking this medication, and follow the directions you see here.   clotrimazole-betamethasone cream Commonly known as: LOTRISONE Apply 1 application topically 2 (two) times daily.   donepezil 10 MG tablet Commonly known as: ARICEPT Take 1 tablet (10 mg  total) by mouth at bedtime.   feeding supplement (GLUCERNA SHAKE) Liqd Take 237 mLs by mouth 2 (two) times daily between meals.   Ensure Max Protein Liqd Take 330 mLs (11 oz total) by mouth at bedtime.   fluticasone 50 MCG/ACT nasal spray Commonly known as: FLONASE Place 2 sprays into both nostrils daily.   furosemide 20 MG tablet Commonly known as: LASIX Take 1 tablet (20 mg total) by mouth daily.   gabapentin 300 MG capsule Commonly known as: NEURONTIN Take 1 capsule (300 mg total) by mouth at bedtime.   guaiFENesin-dextromethorphan 100-10 MG/5ML syrup Commonly known as: ROBITUSSIN DM Take 5 mLs by mouth every 4 (four) hours as needed for cough.   Insulin Aspart FlexPen 100 UNIT/ML Commonly known as: NOVOLOG Inject 5 Units into the skin 3 (three) times daily with meals. What changed: how much to take   insulin glargine 100 UNIT/ML Solostar Pen Commonly known as: LANTUS Inject 15 Units into the skin at bedtime.   LORazepam 0.5 MG tablet Commonly  known as: ATIVAN Take 1 tablet (0.5 mg total) by mouth every 8 (eight) hours as needed for anxiety. What changed:  when to take this reasons to take this   multivitamin with minerals Tabs tablet Take 1 tablet by mouth daily.   pantoprazole 40 MG tablet Commonly known as: PROTONIX Take 1 tablet (40 mg total) by mouth daily.   predniSONE 10 MG tablet Commonly known as: DELTASONE 1 tab po day 1; 1/2 tab po day2,3 then stop   QUEtiapine 25 MG tablet Commonly known as: SEROQUEL Take 3 tablets (75 mg total) by mouth at bedtime. What changed: how much to take   zinc sulfate 220 (50 Zn) MG capsule Take 1 capsule (220 mg total) by mouth daily.               Durable Medical Equipment  (From admission, onward)           Start     Ordered   10/14/21 1347  For home use only DME lightweight manual wheelchair with seat cushion  Once       Comments: Patient suffers from dementia which impairs their ability to perform daily activities like dressing in the home.  A walker will not resolve  issue with performing activities of daily living. A wheelchair will allow patient to safely perform daily activities. Patient is not able to propel themselves in the home using a standard weight wheelchair due to general weakness. Patient can self propel in the lightweight wheelchair. Length of need Lifetime. Accessories: elevating leg rests (ELRs), wheel locks, extensions and anti-tippers.   10/14/21 1347   10/14/21 1330  For home use only DME wheelchair cushion (seat and back)  Once        10/14/21 1329   10/12/21 1347  For home use only DME Hospital bed  Once       Question Answer Comment  Length of Need Lifetime   The above medical condition requires: Patient requires the ability to reposition frequently   Head must be elevated greater than: 30 degrees   Bed type Semi-electric   Hoyer Lift Yes   Trapeze Bar Yes   Support Surface: Alternating Pressure Pad and Pump      10/12/21 1347             Follow-up Information     [facility] In 2 days.   Why: Acute care follow up Contact information: [facility].   Specialty: Emergency Medicine Why: If symptoms worsen Contact information: [id number] [place] [id number] [phone]               Allergies  Allergen Reactions   Codeine     Consultations:    Procedures/Studies: CT Head Wo Contrast  Result Date: 09/23/2021 CLINICAL DATA:  Mental status change, unknown cause EXAM: CT HEAD WITHOUT CONTRAST TECHNIQUE: Contiguous axial images were obtained from the base of the skull through the vertex without intravenous contrast. RADIATION DOSE REDUCTION: This exam was performed according to the departmental dose-optimization program which includes automated exposure control, adjustment of the mA and/or kV according to patient size and/or use of iterative reconstruction technique. COMPARISON:  08/12/2021 FINDINGS: Brain: There is no acute intracranial hemorrhage, mass effect, or edema. Gray-white differentiation is preserved. There is no extra-axial fluid collection. Ventricles and sulci are stable in size and configuration. Minimal patchy low-density in the supratentorial white matter is nonspecific but probably reflects stable minor chronic microvascular ischemic changes. Vascular: There is atherosclerotic calcification at the skull base. Skull: Calvarium is unremarkable. Sinuses/Orbits: No acute finding. Other: None. IMPRESSION: No acute intracranial abnormality. Electronically Signed   By: Macy Mis M.D.   On: 09/23/2021 12:45   CT Angio Chest Pulmonary Embolism (PE) W or WO Contrast  Result Date: 09/24/2021 CLINICAL DATA:  Positive D-dimer.  Suspected pulmonary embolism. EXAM: CT ANGIOGRAPHY CHEST WITH CONTRAST TECHNIQUE: Multidetector CT imaging of the  chest was performed using the standard protocol during bolus administration of intravenous contrast. Multiplanar CT image reconstructions and MIPs were obtained to evaluate the vascular anatomy. RADIATION DOSE REDUCTION: This exam was performed according to the departmental dose-optimization program which includes automated exposure control, adjustment of the mA and/or kV according to patient size and/or use of iterative reconstruction technique. CONTRAST:  67mL OMNIPAQUE IOHEXOL 350 MG/ML SOLN COMPARISON:  CTA chest studies 08/13/2021 and 07/27/2021 FINDINGS: Cardiovascular: There is mild cardiomegaly. The pulmonary arteries are normal in caliber. There is no saddle embolus, but there is acute embolic disease with partially occlusive embolus in the distal right main pulmonary artery, occluding embolus in the interlobar pulmonary artery and partially occluding embolus in the medial and lateral segmental main arteries to the right middle lobe. In the right lower lobe there is additional non occluding embolus in the main artery, in at least 3 segmental divisions and in several downstream posterior basal subsegmental arteries. There are additional at least 1 segmental and 2 subsegmental arteries containing thrombus in the right upper lobe, segmental thrombus in the left upper lobe superior lingular and a few subsegmental lingular segments, and left lower lobe nonocclusive thrombus in the main artery, 3 segmental and several downstream posterior basal subsegmental arteries. There is an overall moderate clot burden but no appreciable right heart strain findings. There is no aortic aneurysm, significant atherosclerosis or dissection , no great vessel stenosis. The pulmonary veins are normal. Mediastinum/Nodes: No enlarged mediastinal, hilar, or axillary lymph nodes. Thyroid gland, trachea, and esophagus demonstrate no significant findings. Lungs/Pleura: Posterior basal atelectasis is similar to prior studies. Small  peripheral ground-glass opacities laterally in the base of the right upper lobe are also unchanged. Probable scarring. The lungs are otherwise clear. There is no pleural effusion, thickening or pneumothorax. The trachea and central airways are clear. Upper Abdomen: No acute abnormality. Musculoskeletal: No chest wall abnormality. No acute or significant osseous findings.  Review of the MIP images confirms the above findings. IMPRESSION: 1. Bilateral pulmonary arterial emboli with overall moderate clot burden, but no arterial dilatation or acute right heart strain findings. 2. Mild cardiomegaly, but the heart is smaller than previously. 3. Stable appearance of lower lobe atelectasis. No focal infiltrate is seen. 4. Results phoned to nurse practitioner Sharion Settler at 5:19 a.m., 09/24/2021. Electronically Signed   By: Telford Nab M.D.   On: 09/24/2021 05:19   MR BRAIN WO CONTRAST  Result Date: 09/23/2021 CLINICAL DATA:  Mental status change, unknown cause EXAM: MRI HEAD WITHOUT CONTRAST TECHNIQUE: Multiplanar, multiecho pulse sequences of the brain and surrounding structures were obtained without intravenous contrast. COMPARISON:  None. FINDINGS: Brain: There is no acute infarction or intracranial hemorrhage. There is no intracranial mass, mass effect, or edema. There is no hydrocephalus or extra-axial fluid collection. Ventricles and sulci are within normal limits in size and configuration. Patchy foci of T2 hyperintensity in the supratentorial white matter are nonspecific but may reflect minimal chronic microvascular ischemic changes. Vascular: Major vessel flow voids at the skull base are preserved. Skull and upper cervical spine: Normal marrow signal is preserved. Sinuses/Orbits: Paranasal sinuses are aerated. Orbits are unremarkable. Other: Sella is unremarkable.  Mastoid air cells are clear. IMPRESSION: No evidence of recent infarction, hemorrhage, or mass. Electronically Signed   By: Macy Mis  M.D.   On: 09/23/2021 14:59   DG Chest Port 1 View  Result Date: 10/14/2021 CLINICAL DATA:  Recent COVID-19 infection, recent pulmonary embolus. Recent urinary tract infection. EXAM: PORTABLE CHEST 1 VIEW COMPARISON:  10/10/2021 FINDINGS: Mild enlargement of the cardiopericardial silhouette, without findings of pulmonary edema. No specific conventional radiographic manifestations of pneumonia. No blunting of the costophrenic angles. Stable left lower lobe scarring versus subsegmental atelectasis. Thoracic spondylosis. IMPRESSION: 1. Mild enlargement of the cardiopericardial silhouette, without current findings of pulmonary edema. 2. Stable left lower lobe scarring versus subsegmental atelectasis. Electronically Signed   By: Van Clines M.D.   On: 10/14/2021 08:23   DG Chest Port 1 View  Result Date: 10/10/2021 CLINICAL DATA:  Fever EXAM: PORTABLE CHEST 1 VIEW COMPARISON:  Chest x-ray 09/23/2021 FINDINGS: Cardiomegaly and mediastinum appear unchanged. Mild pulmonary vascular prominence. No focal consolidation identified. No pleural effusion or pneumothorax identified. IMPRESSION: Cardiomegaly and mild pulmonary vascular prominence. Electronically Signed   By: Ofilia Neas M.D.   On: 10/10/2021 08:59   DG Chest Portable 1 View  Result Date: 09/23/2021 CLINICAL DATA:  Weakness EXAM: PORTABLE CHEST 1 VIEW COMPARISON:  Chest x-ray dated August 12, 2021 FINDINGS: Cardiac and mediastinal contours are unchanged. Mild scattered linear opacities which are similar to prior exam and likely due to scarring. No evidence of acute parenchymal opacity. No large pleural effusion or pneumothorax. IMPRESSION: No active disease. Electronically Signed   By: Yetta Glassman M.D.   On: 09/23/2021 11:59   EEG adult  Result Date: 09/24/2021 Lora Havens, MD     09/24/2021  9:59 PM Patient Name: DANDRA SHAMBAUGH MRN: [id number] Epilepsy Attending: Lora Havens Referring Physician/Provider: Rise Patience, MD Date: 09/24/2021 Duration: 20.17 mins Patient history: 71yo F with ams and syncope. EEG to evaluate for seizure Level of alertness: Awake AEDs during EEG study: None Technical aspects: This EEG study was done with scalp electrodes positioned according to the 10-20 International system of electrode placement. Electrical activity was acquired at a sampling rate of 500Hz  and reviewed with a high frequency filter of 70Hz  and a low frequency  filter of 1Hz . EEG data were recorded continuously and digitally stored. Description: The posterior dominant rhythm consists of 7Hz  activity of moderate voltage (25-35 uV) seen predominantly in posterior head regions, symmetric and reactive to eye opening and eye closing. EEG showed continuous generalized 5 to 7 Hz theta slowing. Physiologic photic driving was not seen during photic stimulation.  Hyperventilation was not performed.   ABNORMALITY - Continuous slow, generalized - Background slow IMPRESSION: This study is suggestive of mild to moderate diffuse encephalopathy, nonspecific etiology. No seizures or epileptiform discharges were seen throughout the recording. Lora Havens   ECHOCARDIOGRAM COMPLETE  Result Date: 09/24/2021    ECHOCARDIOGRAM REPORT   Patient Name:   PRIMROSE OLER Date of Exam: 09/24/2021 Medical Rec #:  [id number]   Height:       65.0 in Accession #:    [id number]  Weight:       216.3 lb Date of Birth:  December 02, 1950    BSA:          2.045 m Patient Age:    71 years    BP:           149/83 mmHg Patient Gender: F           HR:           76 bpm. Exam Location:  [facility] Procedure: 2D Echo Indications:     Syncope R55  History:         Patient has no prior history of Echocardiogram examinations.  Sonographer:     Kathlen Brunswick RDCS Referring Phys:  [place] Diagnosing Phys: Nelva Bush MD  Sonographer Comments: Technically difficult study due to poor echo windows. IMPRESSIONS  1. Left ventricular ejection fraction, by estimation, is 55 to  60%. The left ventricle has normal function. The left ventricle demonstrates regional wall motion abnormalities (see scoring diagram/findings for description). There is mild left ventricular  hypertrophy. Left ventricular diastolic parameters are consistent with Grade I diastolic dysfunction (impaired relaxation). There is of the left ventricular,.  2. Right ventricular systolic function is normal. The right ventricular size is normal.  3. The mitral valve is grossly normal. Trivial mitral valve regurgitation. No evidence of mitral stenosis.  4. The aortic valve was not well visualized. Aortic valve regurgitation is not visualized. No aortic stenosis is present. FINDINGS  Left Ventricle: Left ventricular ejection fraction, by estimation, is 55 to 60%. The left ventricle has normal function. The left ventricle demonstrates regional wall motion abnormalities. The left ventricular internal cavity size was normal in size. There is mild left ventricular hypertrophy. Left ventricular diastolic parameters are consistent with Grade I diastolic dysfunction (impaired relaxation).  LV Wall Scoring: The basal inferolateral segment and basal inferior segment are dyskinetic. The entire anterior wall, antero-lateral wall, mid and distal lateral wall, entire septum, entire apex, and mid and distal inferior wall are normal. Right Ventricle: The right ventricular size is normal. Right vetricular wall thickness was not well visualized. Right ventricular systolic function is normal. Left Atrium: Left atrial size was normal in size. Right Atrium: Right atrial size was normal in size. Pericardium: The pericardium was not well visualized. Mitral Valve: The mitral valve is grossly normal. Trivial mitral valve regurgitation. No evidence of mitral valve stenosis. Tricuspid Valve: The tricuspid valve is not well visualized. Tricuspid valve regurgitation is trivial. Aortic Valve: The aortic valve was not well visualized. Aortic valve  regurgitation is not visualized. No aortic stenosis is present. Aortic valve peak gradient measures  8.4 mmHg. Pulmonic Valve: The pulmonic valve was not well visualized. Aorta: The aortic root is normal in size and structure. Venous: The inferior vena cava was not well visualized. IAS/Shunts: The interatrial septum was not well visualized.  LEFT VENTRICLE PLAX 2D LVIDd:         4.30 cm     Diastology LVIDs:         3.00 cm     LV e' medial:    5.44 cm/s LV PW:         1.20 cm     LV E/e' medial:  14.0 LV IVS:        1.20 cm     LV e' lateral:   4.57 cm/s                            LV E/e' lateral: 16.7  LV Volumes (MOD) LV vol d, MOD A2C: 48.4 ml LV vol d, MOD A4C: 67.7 ml LV vol s, MOD A2C: 17.5 ml LV vol s, MOD A4C: 20.6 ml LV SV MOD A2C:     30.9 ml LV SV MOD A4C:     67.7 ml LV SV MOD BP:      38.7 ml RIGHT VENTRICLE RV Basal diam:  2.90 cm RV S prime:     15.90 cm/s TAPSE (M-mode): 1.9 cm LEFT ATRIUM             Index        RIGHT ATRIUM          Index LA Vol (A2C):   15.8 ml 7.73 ml/m   RA Area:     9.92 cm LA Vol (A4C):   23.3 ml 11.39 ml/m  RA Volume:   19.10 ml 9.34 ml/m LA Biplane Vol: 20.2 ml 9.88 ml/m  AORTIC VALVE AV Vmax:      145.00 cm/s AV Peak Grad: 8.4 mmHg LVOT Vmax:    106.00 cm/s LVOT Vmean:   74.000 cm/s LVOT VTI:     0.208 m  AORTA Ao Root diam: 2.80 cm MITRAL VALVE MV Area (PHT): 2.90 cm    SHUNTS MV Decel Time: 262 msec    Systemic VTI: 0.21 m MV E velocity: 76.30 cm/s MV A velocity: 98.50 cm/s MV E/A ratio:  0.77 Christopher End MD Electronically signed by Nelva Bush MD Signature Date/Time: 09/24/2021/3:27:17 PM    Final    (Echo, Carotid, EGD, Colonoscopy, ERCP)    Subjective: Pt denies any complaints    Discharge Exam: Vitals:   10/19/21 2034 10/20/21 0557  BP: 129/68 (!) 104/51  Pulse: (!) 57 (!) 55  Resp: 19 18  Temp: 98 F (36.7 C) 97.8 F (36.6 C)  SpO2: 97% 97%   Vitals:   10/19/21 0828 10/19/21 1643 10/19/21 2034 10/20/21 0557  BP: 103/60 121/70 129/68  (!) 104/51  Pulse: 77 64 (!) 57 (!) 55  Resp: 16 16 19 18   Temp: 97.9 F (36.6 C) 97.6 F (36.4 C) 98 F (36.7 C) 97.8 F (36.6 C)  TempSrc: Oral Oral  Oral  SpO2: 97% 98% 97% 97%  Weight:      Height:        General: Pt is alert, awake, not in acute distress Cardiovascular: S1/S2 +, no rubs, no gallops Respiratory: CTA bilaterally, no wheezing, no rhonchi Abdominal: Soft, NT, obese, bowel sounds + Extremities: no cyanosis    The results of significant diagnostics from this hospitalization (including  imaging, microbiology, ancillary and laboratory) are listed below for reference.     Microbiology: Recent Results (from the past 240 hour(s))  Resp Panel by RT-PCR (Flu A&B, Covid) Nasopharyngeal Swab     Status: Abnormal   Collection Time: 10/10/21 12:30 PM   Specimen: Nasopharyngeal Swab; Nasopharyngeal(NP) swabs in vial transport medium  Result Value Ref Range Status   SARS Coronavirus 2 by RT PCR POSITIVE (A) NEGATIVE Final    Comment: (NOTE) SARS-CoV-2 target nucleic acids are DETECTED.  The SARS-CoV-2 RNA is generally detectable in upper respiratory specimens during the acute phase of infection. Positive results are indicative of the presence of the identified virus, but do not rule out bacterial infection or co-infection with other pathogens not detected by the test. Clinical correlation with patient history and other diagnostic information is necessary to determine patient infection status. The expected result is Negative.  Fact Sheet for Patients: EntrepreneurPulse.com.au  Fact Sheet for Healthcare Providers: IncredibleEmployment.be  This test is not yet approved or cleared by the [place] FDA and  has been authorized for detection and/or diagnosis of SARS-CoV-2 by FDA under an Emergency Use Authorization (EUA).  This EUA will remain in effect (meaning this test can be used) for the duration of  the COVID-19 declaration  under Section 564(b)(1) of the A ct, 21 U.S.C. section 360bbb-3(b)(1), unless the authorization is terminated or revoked sooner.     Influenza A by PCR NEGATIVE NEGATIVE Final   Influenza B by PCR NEGATIVE NEGATIVE Final    Comment: (NOTE) The Xpert Xpress SARS-CoV-2/FLU/RSV plus assay is intended as an aid in the diagnosis of influenza from Nasopharyngeal swab specimens and should not be used as a sole basis for treatment. Nasal washings and aspirates are unacceptable for Xpert Xpress SARS-CoV-2/FLU/RSV testing.  Fact Sheet for Patients: EntrepreneurPulse.com.au  Fact Sheet for Healthcare Providers: IncredibleEmployment.be  This test is not yet approved or cleared by the [place] FDA and has been authorized for detection and/or diagnosis of SARS-CoV-2 by FDA under an Emergency Use Authorization (EUA). This EUA will remain in effect (meaning this test can be used) for the duration of the COVID-19 declaration under Section 564(b)(1) of the Act, 21 U.S.C. section 360bbb-3(b)(1), unless the authorization is terminated or revoked.  Performed at [facility], [address] [place], [place] [address]   Resp Panel by RT-PCR (Flu A&B, Covid) Nasopharyngeal Swab     Status: Abnormal   Collection Time: 10/17/21 11:05 AM   Specimen: Nasopharyngeal Swab; Nasopharyngeal(NP) swabs in vial transport medium  Result Value Ref Range Status   SARS Coronavirus 2 by RT PCR POSITIVE (A) NEGATIVE Final    Comment: (NOTE) SARS-CoV-2 target nucleic acids are DETECTED.  The SARS-CoV-2 RNA is generally detectable in upper respiratory specimens during the acute phase of infection. Positive results are indicative of the presence of the identified virus, but do not rule out bacterial infection or co-infection with other pathogens not detected by the test. Clinical correlation with patient history and other diagnostic information is necessary to  determine patient infection status. The expected result is Negative.  Fact Sheet for Patients: EntrepreneurPulse.com.au  Fact Sheet for Healthcare Providers: IncredibleEmployment.be  This test is not yet approved or cleared by the [place] FDA and  has been authorized for detection and/or diagnosis of SARS-CoV-2 by FDA under an Emergency Use Authorization (EUA).  This EUA will remain in effect (meaning this test can be used) for the duration of  the COVID-19 declaration under Section 564(b)(1)  of the A ct, 21 U.S.C. section 360bbb-3(b)(1), unless the authorization is terminated or revoked sooner.     Influenza A by PCR NEGATIVE NEGATIVE Final   Influenza B by PCR NEGATIVE NEGATIVE Final    Comment: (NOTE) The Xpert Xpress SARS-CoV-2/FLU/RSV plus assay is intended as an aid in the diagnosis of influenza from Nasopharyngeal swab specimens and should not be used as a sole basis for treatment. Nasal washings and aspirates are unacceptable for Xpert Xpress SARS-CoV-2/FLU/RSV testing.  Fact Sheet for Patients: EntrepreneurPulse.com.au  Fact Sheet for Healthcare Providers: IncredibleEmployment.be  This test is not yet approved or cleared by the [place] FDA and has been authorized for detection and/or diagnosis of SARS-CoV-2 by FDA under an Emergency Use Authorization (EUA). This EUA will remain in effect (meaning this test can be used) for the duration of the COVID-19 declaration under Section 564(b)(1) of the Act, 21 U.S.C. section 360bbb-3(b)(1), unless the authorization is terminated or revoked.  Performed at [facility], [address] [place], [place] [address]   Resp Panel by RT-PCR (Flu A&B, Covid) Nasopharyngeal Swab     Status: Abnormal   Collection Time: 10/19/21 11:05 AM   Specimen: Nasopharyngeal Swab; Nasopharyngeal(NP) swabs in vial transport medium  Result Value Ref  Range Status   SARS Coronavirus 2 by RT PCR POSITIVE (A) NEGATIVE Final    Comment: (NOTE) SARS-CoV-2 target nucleic acids are DETECTED.  The SARS-CoV-2 RNA is generally detectable in upper respiratory specimens during the acute phase of infection. Positive results are indicative of the presence of the identified virus, but do not rule out bacterial infection or co-infection with other pathogens not detected by the test. Clinical correlation with patient history and other diagnostic information is necessary to determine patient infection status. The expected result is Negative.  Fact Sheet for Patients: EntrepreneurPulse.com.au  Fact Sheet for Healthcare Providers: IncredibleEmployment.be  This test is not yet approved or cleared by the [place] FDA and  has been authorized for detection and/or diagnosis of SARS-CoV-2 by FDA under an Emergency Use Authorization (EUA).  This EUA will remain in effect (meaning this test can be used) for the duration of  the COVID-19 declaration under Section 564(b)(1) of the A ct, 21 U.S.C. section 360bbb-3(b)(1), unless the authorization is terminated or revoked sooner.     Influenza A by PCR NEGATIVE NEGATIVE Final   Influenza B by PCR NEGATIVE NEGATIVE Final    Comment: (NOTE) The Xpert Xpress SARS-CoV-2/FLU/RSV plus assay is intended as an aid in the diagnosis of influenza from Nasopharyngeal swab specimens and should not be used as a sole basis for treatment. Nasal washings and aspirates are unacceptable for Xpert Xpress SARS-CoV-2/FLU/RSV testing.  Fact Sheet for Patients: EntrepreneurPulse.com.au  Fact Sheet for Healthcare Providers: IncredibleEmployment.be  This test is not yet approved or cleared by the [place] FDA and has been authorized for detection and/or diagnosis of SARS-CoV-2 by FDA under an Emergency Use Authorization (EUA). This EUA will  remain in effect (meaning this test can be used) for the duration of the COVID-19 declaration under Section 564(b)(1) of the Act, 21 U.S.C. section 360bbb-3(b)(1), unless the authorization is terminated or revoked.  Performed at [facility], [address] [place], [place] [address]      Labs: BNP (last 3 results) Recent Labs    07/27/21 1159 08/12/21 1029  BNP 12.0 76.7   Basic Metabolic Panel: Recent Labs  Lab 10/16/21 0549 10/20/21 0503  NA 137 136  K 4.6 4.2  CL 101 103  CO2 27 26  GLUCOSE 251* 231*  BUN 36* 50*  CREATININE 0.98 1.10*  CALCIUM 9.3 8.9   Liver Function Tests: No results for input(s): AST, ALT, ALKPHOS, BILITOT, PROT, ALBUMIN in the last 168 hours. No results for input(s): LIPASE, AMYLASE in the last 168 hours. No results for input(s): AMMONIA in the last 168 hours. CBC: Recent Labs  Lab 10/16/21 0549 10/19/21 0542 10/20/21 0503  WBC 8.4 11.0* 9.9  HGB 15.6* 15.4* 14.3  HCT 46.8* 47.7* 44.3  MCV 86.2 87.4 87.7  PLT 282 298 253   Cardiac Enzymes: No results for input(s): CKTOTAL, CKMB, CKMBINDEX, TROPONINI in the last 168 hours. BNP: Invalid input(s): POCBNP CBG: Recent Labs  Lab 10/19/21 0830 10/19/21 1215 10/19/21 1644 10/19/21 2033 10/20/21 0820  GLUCAP 157* 231* 295* 241* 201*   D-Dimer No results for input(s): DDIMER in the last 72 hours. Hgb A1c No results for input(s): HGBA1C in the last 72 hours. Lipid Profile No results for input(s): CHOL, HDL, LDLCALC, TRIG, CHOLHDL, LDLDIRECT in the last 72 hours. Thyroid function studies No results for input(s): TSH, T4TOTAL, T3FREE, THYROIDAB in the last 72 hours.  Invalid input(s): FREET3 Anemia work up No results for input(s): VITAMINB12, FOLATE, FERRITIN, TIBC, IRON, RETICCTPCT in the last 72 hours. Urinalysis    Component Value Date/Time   COLORURINE YELLOW 09/23/2021 2351   APPEARANCEUR HAZY (A) 09/23/2021 2351   APPEARANCEUR Cloudy 05/20/2012 0116    LABSPEC 1.020 09/23/2021 2351   LABSPEC 1.025 05/20/2012 0116   PHURINE 5.0 09/23/2021 2351   GLUCOSEU NEGATIVE 09/23/2021 2351   GLUCOSEU >=500 05/20/2012 0116   HGBUR TRACE (A) 09/23/2021 2351   BILIRUBINUR SMALL (A) 09/23/2021 2351   BILIRUBINUR Negative 05/20/2012 0116   KETONESUR TRACE (A) 09/23/2021 2351   PROTEINUR 100 (A) 09/23/2021 2351   NITRITE NEGATIVE 09/23/2021 2351   LEUKOCYTESUR MODERATE (A) 09/23/2021 2351   LEUKOCYTESUR 2+ 05/20/2012 0116   Sepsis Labs Invalid input(s): PROCALCITONIN,  WBC,  LACTICIDVEN Microbiology Recent Results (from the past 240 hour(s))  Resp Panel by RT-PCR (Flu A&B, Covid) Nasopharyngeal Swab     Status: Abnormal   Collection Time: 10/10/21 12:30 PM   Specimen: Nasopharyngeal Swab; Nasopharyngeal(NP) swabs in vial transport medium  Result Value Ref Range Status   SARS Coronavirus 2 by RT PCR POSITIVE (A) NEGATIVE Final    Comment: (NOTE) SARS-CoV-2 target nucleic acids are DETECTED.  The SARS-CoV-2 RNA is generally detectable in upper respiratory specimens during the acute phase of infection. Positive results are indicative of the presence of the identified virus, but do not rule out bacterial infection or co-infection with other pathogens not detected by the test. Clinical correlation with patient history and other diagnostic information is necessary to determine patient infection status. The expected result is Negative.  Fact Sheet for Patients: EntrepreneurPulse.com.au  Fact Sheet for Healthcare Providers: IncredibleEmployment.be  This test is not yet approved or cleared by the [place] FDA and  has been authorized for detection and/or diagnosis of SARS-CoV-2 by FDA under an Emergency Use Authorization (EUA).  This EUA will remain in effect (meaning this test can be used) for the duration of  the COVID-19 declaration under Section 564(b)(1) of the A ct, 21 U.S.C. section 360bbb-3(b)(1),  unless the authorization is terminated or revoked sooner.     Influenza A by PCR NEGATIVE NEGATIVE Final   Influenza B by PCR NEGATIVE NEGATIVE Final    Comment: (NOTE) The Xpert Xpress SARS-CoV-2/FLU/RSV plus assay is  intended as an aid in the diagnosis of influenza from Nasopharyngeal swab specimens and should not be used as a sole basis for treatment. Nasal washings and aspirates are unacceptable for Xpert Xpress SARS-CoV-2/FLU/RSV testing.  Fact Sheet for Patients: EntrepreneurPulse.com.au  Fact Sheet for Healthcare Providers: IncredibleEmployment.be  This test is not yet approved or cleared by the [place] FDA and has been authorized for detection and/or diagnosis of SARS-CoV-2 by FDA under an Emergency Use Authorization (EUA). This EUA will remain in effect (meaning this test can be used) for the duration of the COVID-19 declaration under Section 564(b)(1) of the Act, 21 U.S.C. section 360bbb-3(b)(1), unless the authorization is terminated or revoked.  Performed at [facility], [address] [place], [place] [address]   Resp Panel by RT-PCR (Flu A&B, Covid) Nasopharyngeal Swab     Status: Abnormal   Collection Time: 10/17/21 11:05 AM   Specimen: Nasopharyngeal Swab; Nasopharyngeal(NP) swabs in vial transport medium  Result Value Ref Range Status   SARS Coronavirus 2 by RT PCR POSITIVE (A) NEGATIVE Final    Comment: (NOTE) SARS-CoV-2 target nucleic acids are DETECTED.  The SARS-CoV-2 RNA is generally detectable in upper respiratory specimens during the acute phase of infection. Positive results are indicative of the presence of the identified virus, but do not rule out bacterial infection or co-infection with other pathogens not detected by the test. Clinical correlation with patient history and other diagnostic information is necessary to determine patient infection status. The expected result is Negative.  Fact  Sheet for Patients: EntrepreneurPulse.com.au  Fact Sheet for Healthcare Providers: IncredibleEmployment.be  This test is not yet approved or cleared by the [place] FDA and  has been authorized for detection and/or diagnosis of SARS-CoV-2 by FDA under an Emergency Use Authorization (EUA).  This EUA will remain in effect (meaning this test can be used) for the duration of  the COVID-19 declaration under Section 564(b)(1) of the A ct, 21 U.S.C. section 360bbb-3(b)(1), unless the authorization is terminated or revoked sooner.     Influenza A by PCR NEGATIVE NEGATIVE Final   Influenza B by PCR NEGATIVE NEGATIVE Final    Comment: (NOTE) The Xpert Xpress SARS-CoV-2/FLU/RSV plus assay is intended as an aid in the diagnosis of influenza from Nasopharyngeal swab specimens and should not be used as a sole basis for treatment. Nasal washings and aspirates are unacceptable for Xpert Xpress SARS-CoV-2/FLU/RSV testing.  Fact Sheet for Patients: EntrepreneurPulse.com.au  Fact Sheet for Healthcare Providers: IncredibleEmployment.be  This test is not yet approved or cleared by the [place] FDA and has been authorized for detection and/or diagnosis of SARS-CoV-2 by FDA under an Emergency Use Authorization (EUA). This EUA will remain in effect (meaning this test can be used) for the duration of the COVID-19 declaration under Section 564(b)(1) of the Act, 21 U.S.C. section 360bbb-3(b)(1), unless the authorization is terminated or revoked.  Performed at [facility], [address] [place], [place] [address]   Resp Panel by RT-PCR (Flu A&B, Covid) Nasopharyngeal Swab     Status: Abnormal   Collection Time: 10/19/21 11:05 AM   Specimen: Nasopharyngeal Swab; Nasopharyngeal(NP) swabs in vial transport medium  Result Value Ref Range Status   SARS Coronavirus 2 by RT PCR POSITIVE (A) NEGATIVE Final     Comment: (NOTE) SARS-CoV-2 target nucleic acids are DETECTED.  The SARS-CoV-2 RNA is generally detectable in upper respiratory specimens during the acute phase of infection. Positive results are indicative of the presence of the identified virus,  but do not rule out bacterial infection or co-infection with other pathogens not detected by the test. Clinical correlation with patient history and other diagnostic information is necessary to determine patient infection status. The expected result is Negative.  Fact Sheet for Patients: EntrepreneurPulse.com.au  Fact Sheet for Healthcare Providers: IncredibleEmployment.be  This test is not yet approved or cleared by the [place] FDA and  has been authorized for detection and/or diagnosis of SARS-CoV-2 by FDA under an Emergency Use Authorization (EUA).  This EUA will remain in effect (meaning this test can be used) for the duration of  the COVID-19 declaration under Section 564(b)(1) of the A ct, 21 U.S.C. section 360bbb-3(b)(1), unless the authorization is terminated or revoked sooner.     Influenza A by PCR NEGATIVE NEGATIVE Final   Influenza B by PCR NEGATIVE NEGATIVE Final    Comment: (NOTE) The Xpert Xpress SARS-CoV-2/FLU/RSV plus assay is intended as an aid in the diagnosis of influenza from Nasopharyngeal swab specimens and should not be used as a sole basis for treatment. Nasal washings and aspirates are unacceptable for Xpert Xpress SARS-CoV-2/FLU/RSV testing.  Fact Sheet for Patients: EntrepreneurPulse.com.au  Fact Sheet for Healthcare Providers: IncredibleEmployment.be  This test is not yet approved or cleared by the [place] FDA and has been authorized for detection and/or diagnosis of SARS-CoV-2 by FDA under an Emergency Use Authorization (EUA). This EUA will remain in effect (meaning this test can be used) for the duration of  the COVID-19 declaration under Section 564(b)(1) of the Act, 21 U.S.C. section 360bbb-3(b)(1), unless the authorization is terminated or revoked.  Performed at [facility], [address], [place],  [address]      Time coordinating discharge: Over 30 minutes  SIGNED:   Wyvonnia Dusky, MD  [facility] 10/20/2021, 12:23 PM Pager   If 7PM-7AM, please contact night-coverage

## 2021-10-21 LAB — BASIC METABOLIC PANEL
Anion gap: 8 (ref 5–15)
BUN: 41 mg/dL — ABNORMAL HIGH (ref 8–23)
CO2: 28 mmol/L (ref 22–32)
Calcium: 9.1 mg/dL (ref 8.9–10.3)
Chloride: 103 mmol/L (ref 98–111)
Creatinine, Ser: 0.85 mg/dL (ref 0.44–1.00)
GFR, Estimated: >60 mL/min (ref >60)
Glucose, Bld: 196 mg/dL — ABNORMAL HIGH (ref 70–99)
Potassium: 4.6 mmol/L (ref 3.5–5.1)
Sodium: 139 mmol/L (ref 135–145)

## 2021-10-21 LAB — CBC
HCT: 45.7 % (ref 36.0–46.0)
Hemoglobin: 14.9 g/dL (ref 12.0–15.0)
MCH: 28.6 pg (ref 26.0–34.0)
MCHC: 32.6 g/dL (ref 30.0–36.0)
MCV: 87.7 fL (ref 80.0–100.0)
Platelets: 265 10*3/uL (ref 150–400)
RBC: 5.21 MIL/uL — ABNORMAL HIGH (ref 3.87–5.11)
RDW: 14.2 % (ref 11.5–15.5)
WBC: 10.9 10*3/uL — ABNORMAL HIGH (ref 4.0–10.5)
nRBC: 0 % (ref 0.0–0.2)

## 2021-10-21 LAB — GLUCOSE, CAPILLARY: Glucose-Capillary: 157 mg/dL — ABNORMAL HIGH (ref 70–99)

## 2021-10-21 NOTE — Progress Notes (Signed)
RN attempted to call report to receiving facility. Phone call was transferred but no response. Will continue to attempt to give report.

## 2021-10-21 NOTE — Care Management Important Message (Signed)
Important Message  Patient Details  Name: Judith Shaw MRN: [id number] Date of Birth: 06-Mar-1951   Medicare Important Message Given:  Yes  I reviewed the Important Message from Medicare again with POA, [name] by phone [phone]) and she is in agreement with the discharge. I thanked her for her time.   [name] A Taiten Brawn 10/21/2021, 10:38 AM

## 2021-12-26 DEATH — deceased
